# Patient Record
Sex: Male | Born: 1945 | Race: White | Hispanic: No | Marital: Married | Smoking: Former smoker
Health system: Southern US, Community
[De-identification: ages and names within clinical notes are randomized; demographics above are authoritative.]

## PROBLEM LIST (undated history)

## (undated) DIAGNOSIS — N400 Enlarged prostate without lower urinary tract symptoms: Secondary | ICD-10-CM

## (undated) DIAGNOSIS — G629 Polyneuropathy, unspecified: Secondary | ICD-10-CM

## (undated) DIAGNOSIS — C91Z0 Other lymphoid leukemia not having achieved remission: Secondary | ICD-10-CM

## (undated) DIAGNOSIS — I10 Essential (primary) hypertension: Secondary | ICD-10-CM

## (undated) DIAGNOSIS — R972 Elevated prostate specific antigen [PSA]: Secondary | ICD-10-CM

## (undated) DIAGNOSIS — E785 Hyperlipidemia, unspecified: Secondary | ICD-10-CM

## (undated) DIAGNOSIS — G252 Other specified forms of tremor: Principal | ICD-10-CM

## (undated) DIAGNOSIS — G250 Essential tremor: Secondary | ICD-10-CM

## (undated) DIAGNOSIS — I2510 Atherosclerotic heart disease of native coronary artery without angina pectoris: Secondary | ICD-10-CM

## (undated) DIAGNOSIS — Z860101 Personal history of adenomatous and serrated colon polyps: Secondary | ICD-10-CM

## (undated) DIAGNOSIS — N529 Male erectile dysfunction, unspecified: Secondary | ICD-10-CM

## (undated) DIAGNOSIS — Z86010 Personal history of colonic polyps: Secondary | ICD-10-CM

## (undated) DIAGNOSIS — N486 Induration penis plastica: Secondary | ICD-10-CM

## (undated) DIAGNOSIS — E538 Deficiency of other specified B group vitamins: Secondary | ICD-10-CM

## (undated) DIAGNOSIS — Z955 Presence of coronary angioplasty implant and graft: Secondary | ICD-10-CM

## (undated) DIAGNOSIS — M1990 Unspecified osteoarthritis, unspecified site: Secondary | ICD-10-CM

## (undated) DIAGNOSIS — F909 Attention-deficit hyperactivity disorder, unspecified type: Secondary | ICD-10-CM

## (undated) DIAGNOSIS — R351 Nocturia: Secondary | ICD-10-CM

## (undated) DIAGNOSIS — K5000 Crohn's disease of small intestine without complications: Secondary | ICD-10-CM

## (undated) HISTORY — DX: Essential tremor: G25.0

## (undated) HISTORY — PX: CARDIOVASCULAR STRESS TEST: SHX262

## (undated) HISTORY — PX: TONSILLECTOMY: SUR1361

## (undated) HISTORY — DX: Other specified forms of tremor: G25.2

## (undated) HISTORY — PX: TRANSURETHRAL RESECTION OF PROSTATE: SHX73

## (undated) HISTORY — PX: OTHER SURGICAL HISTORY: SHX169

---

## 1994-03-06 HISTORY — PX: OTHER SURGICAL HISTORY: SHX169

## 1997-12-04 HISTORY — PX: CORONARY ANGIOPLASTY WITH STENT PLACEMENT: SHX49

## 1998-01-01 ENCOUNTER — Observation Stay (HOSPITAL_COMMUNITY): Admission: AD | Admit: 1998-01-01 | Discharge: 1998-01-02 | Payer: Self-pay | Admitting: Cardiology

## 1999-02-12 ENCOUNTER — Encounter: Payer: Self-pay | Admitting: Emergency Medicine

## 1999-02-12 ENCOUNTER — Emergency Department (HOSPITAL_COMMUNITY): Admission: EM | Admit: 1999-02-12 | Discharge: 1999-02-12 | Payer: Self-pay | Admitting: Emergency Medicine

## 1999-07-21 ENCOUNTER — Encounter: Admission: RE | Admit: 1999-07-21 | Discharge: 1999-07-21 | Payer: Self-pay | Admitting: Hematology and Oncology

## 1999-07-21 ENCOUNTER — Encounter: Payer: Self-pay | Admitting: Hematology and Oncology

## 2000-03-06 HISTORY — PX: PROSTATE SURGERY: SHX751

## 2000-03-12 ENCOUNTER — Ambulatory Visit (HOSPITAL_COMMUNITY): Admission: RE | Admit: 2000-03-12 | Discharge: 2000-03-12 | Payer: Self-pay | Admitting: Gastroenterology

## 2004-01-06 ENCOUNTER — Ambulatory Visit (HOSPITAL_COMMUNITY): Admission: RE | Admit: 2004-01-06 | Discharge: 2004-01-06 | Payer: Self-pay | Admitting: General Surgery

## 2004-01-10 HISTORY — PX: LAPAROSCOPIC INGUINAL HERNIA REPAIR: SUR788

## 2004-03-14 ENCOUNTER — Ambulatory Visit: Payer: Self-pay | Admitting: Oncology

## 2004-08-15 ENCOUNTER — Ambulatory Visit: Payer: Self-pay | Admitting: Oncology

## 2004-10-27 ENCOUNTER — Encounter (INDEPENDENT_AMBULATORY_CARE_PROVIDER_SITE_OTHER): Payer: Self-pay | Admitting: *Deleted

## 2004-10-27 ENCOUNTER — Ambulatory Visit (HOSPITAL_BASED_OUTPATIENT_CLINIC_OR_DEPARTMENT_OTHER): Admission: RE | Admit: 2004-10-27 | Discharge: 2004-10-27 | Payer: Self-pay | Admitting: General Surgery

## 2005-08-17 ENCOUNTER — Ambulatory Visit: Payer: Self-pay | Admitting: Oncology

## 2005-08-28 LAB — CBC WITH DIFFERENTIAL/PLATELET
BASO%: 0.5 % (ref 0.0–2.0)
Basophils Absolute: 0 10*3/uL (ref 0.0–0.1)
EOS%: 2.6 % (ref 0.0–7.0)
Eosinophils Absolute: 0.2 10*3/uL (ref 0.0–0.5)
HCT: 43.3 % (ref 38.7–49.9)
HGB: 14.7 g/dL (ref 13.0–17.1)
LYMPH%: 24.5 % (ref 14.0–48.0)
MCH: 30 pg (ref 28.0–33.4)
MCHC: 34 g/dL (ref 32.0–35.9)
MCV: 88.4 fL (ref 81.6–98.0)
MONO#: 0.7 10*3/uL (ref 0.1–0.9)
MONO%: 8.8 % (ref 0.0–13.0)
NEUT#: 5.1 10*3/uL (ref 1.5–6.5)
NEUT%: 63.6 % (ref 40.0–75.0)
Platelets: 263 10*3/uL (ref 145–400)
RBC: 4.9 10*6/uL (ref 4.20–5.71)
RDW: 13.9 % (ref 11.2–14.6)
WBC: 8.1 10*3/uL (ref 4.0–10.0)
lymph#: 2 10*3/uL (ref 0.9–3.3)

## 2005-12-14 ENCOUNTER — Ambulatory Visit: Payer: Self-pay | Admitting: Oncology

## 2006-08-23 ENCOUNTER — Ambulatory Visit: Payer: Self-pay | Admitting: Oncology

## 2006-09-03 LAB — CBC WITH DIFFERENTIAL/PLATELET
BASO%: 0.5 % (ref 0.0–2.0)
Basophils Absolute: 0 10*3/uL (ref 0.0–0.1)
EOS%: 3 % (ref 0.0–7.0)
Eosinophils Absolute: 0.2 10*3/uL (ref 0.0–0.5)
HCT: 41.9 % (ref 38.7–49.9)
HGB: 14.5 g/dL (ref 13.0–17.1)
LYMPH%: 24.2 % (ref 14.0–48.0)
MCH: 30.4 pg (ref 28.0–33.4)
MCHC: 34.5 g/dL (ref 32.0–35.9)
MCV: 88 fL (ref 81.6–98.0)
MONO#: 0.8 10*3/uL (ref 0.1–0.9)
MONO%: 9.9 % (ref 0.0–13.0)
NEUT#: 4.8 10*3/uL (ref 1.5–6.5)
NEUT%: 62.4 % (ref 40.0–75.0)
Platelets: 229 10*3/uL (ref 145–400)
RBC: 4.76 10*6/uL (ref 4.20–5.71)
RDW: 14.1 % (ref 11.2–14.6)
WBC: 7.7 10*3/uL (ref 4.0–10.0)
lymph#: 1.8 10*3/uL (ref 0.9–3.3)

## 2007-08-30 ENCOUNTER — Ambulatory Visit: Payer: Self-pay | Admitting: Oncology

## 2007-09-03 LAB — CBC WITH DIFFERENTIAL/PLATELET
BASO%: 0.5 % (ref 0.0–2.0)
Basophils Absolute: 0 10*3/uL (ref 0.0–0.1)
EOS%: 3.2 % (ref 0.0–7.0)
Eosinophils Absolute: 0.2 10*3/uL (ref 0.0–0.5)
HCT: 43.3 % (ref 38.7–49.9)
HGB: 14.9 g/dL (ref 13.0–17.1)
LYMPH%: 25.4 % (ref 14.0–48.0)
MCH: 30.1 pg (ref 28.0–33.4)
MCHC: 34.4 g/dL (ref 32.0–35.9)
MCV: 87.5 fL (ref 81.6–98.0)
MONO#: 0.5 10*3/uL (ref 0.1–0.9)
MONO%: 8.2 % (ref 0.0–13.0)
NEUT#: 4.2 10*3/uL (ref 1.5–6.5)
NEUT%: 62.7 % (ref 40.0–75.0)
Platelets: 240 10*3/uL (ref 145–400)
RBC: 4.95 10*6/uL (ref 4.20–5.71)
RDW: 13.8 % (ref 11.2–14.6)
WBC: 6.6 10*3/uL (ref 4.0–10.0)
lymph#: 1.7 10*3/uL (ref 0.9–3.3)

## 2007-09-03 LAB — IGG, IGA, IGM
IgA: 70 mg/dL (ref 68–378)
IgG (Immunoglobin G), Serum: 817 mg/dL (ref 694–1618)
IgM, Serum: 15 mg/dL — ABNORMAL LOW (ref 60–263)

## 2008-09-15 ENCOUNTER — Ambulatory Visit: Payer: Self-pay | Admitting: Oncology

## 2008-09-17 LAB — CBC WITH DIFFERENTIAL/PLATELET
BASO%: 0.5 % (ref 0.0–2.0)
Basophils Absolute: 0 10*3/uL (ref 0.0–0.1)
EOS%: 3.3 % (ref 0.0–7.0)
Eosinophils Absolute: 0.3 10*3/uL (ref 0.0–0.5)
HCT: 43.7 % (ref 38.4–49.9)
HGB: 14.9 g/dL (ref 13.0–17.1)
LYMPH%: 23 % (ref 14.0–49.0)
MCH: 30.2 pg (ref 27.2–33.4)
MCHC: 34.1 g/dL (ref 32.0–36.0)
MCV: 88.4 fL (ref 79.3–98.0)
MONO#: 0.6 10*3/uL (ref 0.1–0.9)
MONO%: 7.7 % (ref 0.0–14.0)
NEUT#: 5.2 10*3/uL (ref 1.5–6.5)
NEUT%: 65.5 % (ref 39.0–75.0)
Platelets: 224 10*3/uL (ref 140–400)
RBC: 4.94 10*6/uL (ref 4.20–5.82)
RDW: 13.4 % (ref 11.0–14.6)
WBC: 7.9 10*3/uL (ref 4.0–10.3)
lymph#: 1.8 10*3/uL (ref 0.9–3.3)

## 2009-09-14 ENCOUNTER — Ambulatory Visit: Payer: Self-pay | Admitting: Oncology

## 2009-09-16 LAB — CBC WITH DIFFERENTIAL/PLATELET
BASO%: 0.7 % (ref 0.0–2.0)
Basophils Absolute: 0 10*3/uL (ref 0.0–0.1)
EOS%: 3.4 % (ref 0.0–7.0)
Eosinophils Absolute: 0.2 10*3/uL (ref 0.0–0.5)
HCT: 41.8 % (ref 38.4–49.9)
HGB: 14.4 g/dL (ref 13.0–17.1)
LYMPH%: 24.6 % (ref 14.0–49.0)
MCH: 30.6 pg (ref 27.2–33.4)
MCHC: 34.4 g/dL (ref 32.0–36.0)
MCV: 88.9 fL (ref 79.3–98.0)
MONO#: 0.7 10*3/uL (ref 0.1–0.9)
MONO%: 10.1 % (ref 0.0–14.0)
NEUT#: 4.4 10*3/uL (ref 1.5–6.5)
NEUT%: 61.2 % (ref 39.0–75.0)
Platelets: 214 10*3/uL (ref 140–400)
RBC: 4.7 10*6/uL (ref 4.20–5.82)
RDW: 13.6 % (ref 11.0–14.6)
WBC: 7.2 10*3/uL (ref 4.0–10.3)
lymph#: 1.8 10*3/uL (ref 0.9–3.3)

## 2010-02-01 ENCOUNTER — Encounter: Admission: RE | Admit: 2010-02-01 | Discharge: 2010-02-01 | Payer: Self-pay | Admitting: Neurosurgery

## 2010-05-13 ENCOUNTER — Emergency Department (HOSPITAL_COMMUNITY): Payer: Medicare Other

## 2010-05-13 ENCOUNTER — Emergency Department (HOSPITAL_COMMUNITY)
Admission: EM | Admit: 2010-05-13 | Discharge: 2010-05-13 | Disposition: A | Discharge status: Home / Self Care | Payer: Medicare Other | Attending: Emergency Medicine | Admitting: Emergency Medicine

## 2010-05-13 DIAGNOSIS — M25469 Effusion, unspecified knee: Secondary | ICD-10-CM | POA: Insufficient documentation

## 2010-05-13 DIAGNOSIS — K219 Gastro-esophageal reflux disease without esophagitis: Secondary | ICD-10-CM | POA: Insufficient documentation

## 2010-05-13 DIAGNOSIS — M7989 Other specified soft tissue disorders: Secondary | ICD-10-CM | POA: Insufficient documentation

## 2010-05-13 DIAGNOSIS — Z7982 Long term (current) use of aspirin: Secondary | ICD-10-CM | POA: Insufficient documentation

## 2010-05-13 DIAGNOSIS — M79609 Pain in unspecified limb: Secondary | ICD-10-CM

## 2010-05-13 DIAGNOSIS — M25569 Pain in unspecified knee: Secondary | ICD-10-CM | POA: Insufficient documentation

## 2010-05-13 DIAGNOSIS — I824Z9 Acute embolism and thrombosis of unspecified deep veins of unspecified distal lower extremity: Secondary | ICD-10-CM | POA: Insufficient documentation

## 2010-05-13 DIAGNOSIS — Z79899 Other long term (current) drug therapy: Secondary | ICD-10-CM | POA: Insufficient documentation

## 2010-05-13 DIAGNOSIS — K5090 Crohn's disease, unspecified, without complications: Secondary | ICD-10-CM | POA: Insufficient documentation

## 2010-05-13 LAB — POCT I-STAT, CHEM 8
BUN: 20 mg/dL (ref 6–23)
Calcium, Ion: 1 mmol/L — ABNORMAL LOW (ref 1.12–1.32)
Chloride: 106 mEq/L (ref 96–112)
Creatinine, Ser: 1 mg/dL (ref 0.4–1.5)
Glucose, Bld: 95 mg/dL (ref 70–99)
HCT: 46 % (ref 39.0–52.0)
Hemoglobin: 15.6 g/dL (ref 13.0–17.0)
Potassium: 3.9 mEq/L (ref 3.5–5.1)
Sodium: 139 mEq/L (ref 135–145)
TCO2: 22 mmol/L (ref 0–100)

## 2010-05-16 ENCOUNTER — Other Ambulatory Visit: Payer: Self-pay | Admitting: Gastroenterology

## 2010-05-16 ENCOUNTER — Ambulatory Visit
Admission: RE | Admit: 2010-05-16 | Discharge: 2010-05-16 | Disposition: A | Discharge status: Home / Self Care | Payer: Medicare Other | Source: Ambulatory Visit | Attending: Gastroenterology | Admitting: Gastroenterology

## 2010-05-16 DIAGNOSIS — R609 Edema, unspecified: Secondary | ICD-10-CM

## 2010-05-16 DIAGNOSIS — R52 Pain, unspecified: Secondary | ICD-10-CM

## 2010-07-22 NOTE — Op Note (Signed)
Cole Martinez, Cole Martinez NO.:  [id number]   MEDICAL RECORD NO.:  [id number]          PATIENT TYPE:  AMB   LOCATION:  DAY                          FACILITY:  [facility] - Inpatient   PHYSICIAN:  Sammuel Hines. Daiva Nakayama, M.D. DATE OF BIRTH:  06/03/1945   DATE OF PROCEDURE:  01/10/2004  DATE OF DISCHARGE:  01/06/2004                                 OPERATIVE REPORT   PREOPERATIVE DIAGNOSES:  Bilateral inguinal hernias.   POSTOPERATIVE DIAGNOSES:  Bilateral inguinal hernias.   OPERATION PERFORMED:  Laparoscopic bilateral inguinal hernia repairs with  mesh.   SURGEON:  Sammuel Hines. Marlou Starks, M.D.   ASSISTANT:  Kathrin Penner, M.D.   ANESTHESIA:  General endotracheal.   DESCRIPTION OF PROCEDURE:  After informed consent was obtained, the patient  was brought to the operating room and placed in supine position on the  operating table.  After adequate induction of general anesthesia, the  patient's abdomen was prepped with Betadine and draped in the usual sterile  manner.  The area just to the right of the umbilicus was infiltrated with  0.25% Marcaine.  A small incision was made with a 15 blade knife just  inferior and lateral to the umbilicus.  This incision was carried down  through the skin and subcutaneous tissue bluntly with Kelly clamp and army  navy retractors until the fascia of the external oblique was encountered.  The fascia of the external oblique was opened sharply with a 15 blade knife.  The rectus muscle was then split laterally and the S-retractor was placed in  this space just posterior to the rectus muscle.  A balloon dilating device  was then placed in this plane and angled toward the pubic symphysis. Once  this device was in place and could be felt touching the pubic symphysis, the  balloon was inflated with approximately 35 pumps of the insufflation device.  The balloon was left inflated for several minutes to help with hemostasis.  The balloon was then deflated and removed from  the space.  The Hasson  cannula was then placed back in this space behind the rectus muscle and the  balloon on the Hasson was inflated with 20 mL of saline.  The preperitoneal  space was then insufflated without difficulty.  Sites on the midline just  beneath the Hasson cannula were chosen for placement of 5 mm ports.  Each of  these areas was infiltrated with 0.25% Marcaine and 5 mm ports were placed  bluntly through these incisions into the abdominal cavity under direct  vision.  Initially, attention was turned to the left side.  Using blunt  dissection, the cord structures were skeletonized.  There was a definite  direct hernia.  There was no obvious cord sac with the cord structures.  Once the area was completely cleared from blunt dissection and space was  made circumferentially around the cord structures, a 6 x 6 piece of Prolene  mesh was cut to approximately 4 x 6.  Tails were cut in the mesh laterally.  The mesh was then rolled like a cigar and placed in the preperitoneal space.  Once in the space, the mesh was unrolled without difficulty. The inferior  tail of the mesh was placed deep and around the spermatic cord and the tails  were placed lateral to the cord.  Once the mesh was in good position, it was  anchored in placed with several tacks to hold the mesh in place.  Attention  was then turned to the right side and again using blunt dissection, the  right inguinal region was cleared.  There was a definite direct defect.  The  cord was skeletonized by blunt dissection and there was no obvious hernia  sac, but the cord structures.  Space was cleared around the cord  circumferentially.  Another 6 x 6 piece of Prolene mesh was then cut to 4 x  6 and tails were cut in the mesh laterally.  The mesh was rolled like a  cigar, placed in the preperitoneal space where it was unrolled. The inferior  mesh was then placed posterior to the cord and the tails were placed lateral  to the cord.   Once the mesh was in good position covering the defects, the  mesh was anchored in place with several tacks.  The area was examined and  found to be completely hemostatic.  The mesh was in good position.  At this  point, the insufflation was released and the mesh was examined to make sure  it stayed in place which it did.  The Hasson and 5 mm ports were then  removed and all were found to be hemostatic.  The fascial defect at the  [facility] port site was closed with a figure-of-eight 0 Vicryl stitch and the  skin incisions were all closed with interrupted 4-0 Monocryl subcuticular  stitches and benzoin, Steri-Strips and sterile dressings were applied.  The  patient tolerated the procedure well.  At the end of the case, all sponge,  needle and instrument counts were correct. The patient was then awakened and  taken to the recovery room in stable condition.      PST/MEDQ  D:  01/10/2004  T:  01/11/2004  Job:  [id number]

## 2010-08-17 ENCOUNTER — Other Ambulatory Visit: Payer: Self-pay | Admitting: Dermatology

## 2010-10-18 ENCOUNTER — Other Ambulatory Visit: Payer: Self-pay | Admitting: Oncology

## 2010-10-18 ENCOUNTER — Encounter (HOSPITAL_BASED_OUTPATIENT_CLINIC_OR_DEPARTMENT_OTHER): Payer: Medicare Other | Admitting: Oncology

## 2010-10-18 DIAGNOSIS — C9110 Chronic lymphocytic leukemia of B-cell type not having achieved remission: Secondary | ICD-10-CM

## 2010-10-18 DIAGNOSIS — Z23 Encounter for immunization: Secondary | ICD-10-CM

## 2010-10-18 LAB — CBC WITH DIFFERENTIAL/PLATELET
BASO%: 0.3 % (ref 0.0–2.0)
Basophils Absolute: 0 10*3/uL (ref 0.0–0.1)
EOS%: 3 % (ref 0.0–7.0)
Eosinophils Absolute: 0.2 10*3/uL (ref 0.0–0.5)
HCT: 41.9 % (ref 38.4–49.9)
HGB: 14.4 g/dL (ref 13.0–17.1)
LYMPH%: 21.8 % (ref 14.0–49.0)
MCH: 30.9 pg (ref 27.2–33.4)
MCHC: 34.3 g/dL (ref 32.0–36.0)
MCV: 90.2 fL (ref 79.3–98.0)
MONO#: 0.7 10*3/uL (ref 0.1–0.9)
MONO%: 9.3 % (ref 0.0–14.0)
NEUT#: 4.8 10*3/uL (ref 1.5–6.5)
NEUT%: 65.6 % (ref 39.0–75.0)
Platelets: 209 10*3/uL (ref 140–400)
RBC: 4.65 10*6/uL (ref 4.20–5.82)
RDW: 13.6 % (ref 11.0–14.6)
WBC: 7.4 10*3/uL (ref 4.0–10.3)
lymph#: 1.6 10*3/uL (ref 0.9–3.3)

## 2011-03-22 DIAGNOSIS — N486 Induration penis plastica: Secondary | ICD-10-CM | POA: Diagnosis not present

## 2011-03-22 DIAGNOSIS — N401 Enlarged prostate with lower urinary tract symptoms: Secondary | ICD-10-CM | POA: Diagnosis not present

## 2011-04-15 ENCOUNTER — Telehealth: Payer: Self-pay | Admitting: Oncology

## 2011-04-15 NOTE — Telephone Encounter (Signed)
lmonvm advising the pt of his aug 2013 appts

## 2011-04-25 DIAGNOSIS — M51379 Other intervertebral disc degeneration, lumbosacral region without mention of lumbar back pain or lower extremity pain: Secondary | ICD-10-CM | POA: Diagnosis not present

## 2011-04-25 DIAGNOSIS — M5137 Other intervertebral disc degeneration, lumbosacral region: Secondary | ICD-10-CM | POA: Diagnosis not present

## 2011-04-25 DIAGNOSIS — M1710 Unilateral primary osteoarthritis, unspecified knee: Secondary | ICD-10-CM | POA: Diagnosis not present

## 2011-04-26 DIAGNOSIS — M545 Low back pain, unspecified: Secondary | ICD-10-CM | POA: Diagnosis not present

## 2011-04-26 DIAGNOSIS — M4310 Spondylolisthesis, site unspecified: Secondary | ICD-10-CM | POA: Diagnosis not present

## 2011-04-26 DIAGNOSIS — M546 Pain in thoracic spine: Secondary | ICD-10-CM | POA: Diagnosis not present

## 2011-06-21 DIAGNOSIS — I2510 Atherosclerotic heart disease of native coronary artery without angina pectoris: Secondary | ICD-10-CM | POA: Diagnosis not present

## 2011-06-21 DIAGNOSIS — Z0000 Encounter for general adult medical examination without abnormal findings: Secondary | ICD-10-CM | POA: Diagnosis not present

## 2011-06-21 DIAGNOSIS — K5000 Crohn's disease of small intestine without complications: Secondary | ICD-10-CM | POA: Diagnosis not present

## 2011-06-21 DIAGNOSIS — E538 Deficiency of other specified B group vitamins: Secondary | ICD-10-CM | POA: Diagnosis not present

## 2011-06-21 DIAGNOSIS — C9110 Chronic lymphocytic leukemia of B-cell type not having achieved remission: Secondary | ICD-10-CM | POA: Diagnosis not present

## 2011-06-21 DIAGNOSIS — N400 Enlarged prostate without lower urinary tract symptoms: Secondary | ICD-10-CM | POA: Diagnosis not present

## 2011-06-21 DIAGNOSIS — E7800 Pure hypercholesterolemia, unspecified: Secondary | ICD-10-CM | POA: Diagnosis not present

## 2011-07-18 DIAGNOSIS — M5137 Other intervertebral disc degeneration, lumbosacral region: Secondary | ICD-10-CM | POA: Diagnosis not present

## 2011-07-18 DIAGNOSIS — M1710 Unilateral primary osteoarthritis, unspecified knee: Secondary | ICD-10-CM | POA: Diagnosis not present

## 2011-10-10 ENCOUNTER — Telehealth: Payer: Self-pay | Admitting: Oncology

## 2011-10-10 NOTE — Telephone Encounter (Signed)
S/w the pt and she is aware of his r/s aug appt to sept 2013

## 2011-10-17 ENCOUNTER — Ambulatory Visit: Payer: Medicare Other | Admitting: Oncology

## 2011-10-17 ENCOUNTER — Other Ambulatory Visit: Payer: Medicare Other | Admitting: Lab

## 2011-10-17 DIAGNOSIS — E782 Mixed hyperlipidemia: Secondary | ICD-10-CM | POA: Diagnosis not present

## 2011-10-23 DIAGNOSIS — I2510 Atherosclerotic heart disease of native coronary artery without angina pectoris: Secondary | ICD-10-CM | POA: Diagnosis not present

## 2011-10-23 DIAGNOSIS — C9110 Chronic lymphocytic leukemia of B-cell type not having achieved remission: Secondary | ICD-10-CM | POA: Diagnosis not present

## 2011-10-23 DIAGNOSIS — K5090 Crohn's disease, unspecified, without complications: Secondary | ICD-10-CM | POA: Diagnosis not present

## 2011-10-23 DIAGNOSIS — R030 Elevated blood-pressure reading, without diagnosis of hypertension: Secondary | ICD-10-CM | POA: Diagnosis not present

## 2011-10-23 DIAGNOSIS — F909 Attention-deficit hyperactivity disorder, unspecified type: Secondary | ICD-10-CM | POA: Diagnosis not present

## 2011-10-23 DIAGNOSIS — E7800 Pure hypercholesterolemia, unspecified: Secondary | ICD-10-CM | POA: Diagnosis not present

## 2011-11-16 ENCOUNTER — Other Ambulatory Visit (HOSPITAL_BASED_OUTPATIENT_CLINIC_OR_DEPARTMENT_OTHER): Payer: Medicare Other

## 2011-11-16 ENCOUNTER — Telehealth: Payer: Self-pay | Admitting: Oncology

## 2011-11-16 ENCOUNTER — Ambulatory Visit (HOSPITAL_BASED_OUTPATIENT_CLINIC_OR_DEPARTMENT_OTHER): Payer: Medicare Other | Admitting: Oncology

## 2011-11-16 VITALS — BP 142/89 | HR 75 | Temp 98.3°F | Resp 20 | Wt 197.0 lb

## 2011-11-16 DIAGNOSIS — C9110 Chronic lymphocytic leukemia of B-cell type not having achieved remission: Secondary | ICD-10-CM

## 2011-11-16 LAB — CBC WITH DIFFERENTIAL/PLATELET
BASO%: 0.4 % (ref 0.0–2.0)
Basophils Absolute: 0 10*3/uL (ref 0.0–0.1)
EOS%: 2.1 % (ref 0.0–7.0)
Eosinophils Absolute: 0.2 10*3/uL (ref 0.0–0.5)
HCT: 43 % (ref 38.4–49.9)
HGB: 14.6 g/dL (ref 13.0–17.1)
LYMPH%: 16.9 % (ref 14.0–49.0)
MCH: 30.6 pg (ref 27.2–33.4)
MCHC: 34 g/dL (ref 32.0–36.0)
MCV: 90.2 fL (ref 79.3–98.0)
MONO#: 1 10*3/uL — ABNORMAL HIGH (ref 0.1–0.9)
MONO%: 10.3 % (ref 0.0–14.0)
NEUT#: 7 10*3/uL — ABNORMAL HIGH (ref 1.5–6.5)
NEUT%: 70.3 % (ref 39.0–75.0)
Platelets: 214 10*3/uL (ref 140–400)
RBC: 4.77 10*6/uL (ref 4.20–5.82)
RDW: 13.7 % (ref 11.0–14.6)
WBC: 9.9 10*3/uL (ref 4.0–10.3)
lymph#: 1.7 10*3/uL (ref 0.9–3.3)

## 2011-11-16 NOTE — Telephone Encounter (Signed)
Per patient for ins purposes apt and labs needed to be at end of august.  Made and printed schedule for pt august 2014. Pt aware.

## 2011-11-16 NOTE — Patient Instructions (Signed)
[facility] Discharge Instructions  Your exam findings, labs and results were discussed with your MD today.   Please visit scheduling to obtain calendar for future appointments.  Please call the [facility] at [phone] during business hours should you have any further questions or need assistance in obtaining follow-up care. If you have a medical emergency, please dial 911.  Special Instructions:

## 2011-11-16 NOTE — Progress Notes (Signed)
   [place]    OFFICE PROGRESS NOTE   INTERVAL HISTORY:   He returns as scheduled. No fever, night sweats, anorexia/weight loss, or recent infections. No palpable lymph nodes. His only complaint is arthritis pain.  Objective:  Vital signs in last 24 hours:  Blood pressure 142/89, pulse 75, temperature 98.3 F (36.8 C), temperature source Oral, resp. rate 20, weight 197 lb (89.359 kg).    HEENT: Neck without mass Lymphatics: No cervical, supra-clavicular, axillary, or inguinal nodes Resp: Lungs clear bilaterally Cardio: Regular rate and rhythm GI: No hepatosplenomegaly Vascular: No leg edema   Lab Results:  Lab Results  Component Value Date   WBC 9.9 11/16/2011   HGB 14.6 11/16/2011   HCT 43.0 11/16/2011   MCV 90.2 11/16/2011   PLT 214 11/16/2011   ANC 7.0, absolute lymphocyte count 1.7   Medications: I have reviewed the patient's current medications.  Assessment/Plan: 1. Chronic lymphocytic leukemia, diagnosed in 1996.  He remains asymptomatic and stable from a hematologic standpoint. 2.     pneumococcal vaccine given on 10/18/2010  Disposition:  He remains asymptomatic from the chronic lymphocytic leukemia. He would like to continue followup in the hematology clinic. He will return for an office visit and CBC in one year. He will stay up-to-date on the influenza vaccine.   Betsy Coder, MD  11/16/2011  2:19 PM

## 2011-12-08 DIAGNOSIS — J069 Acute upper respiratory infection, unspecified: Secondary | ICD-10-CM | POA: Diagnosis not present

## 2011-12-08 DIAGNOSIS — E538 Deficiency of other specified B group vitamins: Secondary | ICD-10-CM | POA: Diagnosis not present

## 2011-12-22 DIAGNOSIS — H2510 Age-related nuclear cataract, unspecified eye: Secondary | ICD-10-CM | POA: Diagnosis not present

## 2012-01-01 DIAGNOSIS — Z23 Encounter for immunization: Secondary | ICD-10-CM | POA: Diagnosis not present

## 2012-03-13 DIAGNOSIS — E538 Deficiency of other specified B group vitamins: Secondary | ICD-10-CM | POA: Diagnosis not present

## 2012-05-16 DIAGNOSIS — N401 Enlarged prostate with lower urinary tract symptoms: Secondary | ICD-10-CM | POA: Diagnosis not present

## 2012-05-22 DIAGNOSIS — N486 Induration penis plastica: Secondary | ICD-10-CM | POA: Diagnosis not present

## 2012-05-22 DIAGNOSIS — R972 Elevated prostate specific antigen [PSA]: Secondary | ICD-10-CM | POA: Diagnosis not present

## 2012-05-22 DIAGNOSIS — N401 Enlarged prostate with lower urinary tract symptoms: Secondary | ICD-10-CM | POA: Diagnosis not present

## 2012-06-11 DIAGNOSIS — E538 Deficiency of other specified B group vitamins: Secondary | ICD-10-CM | POA: Diagnosis not present

## 2012-06-14 ENCOUNTER — Other Ambulatory Visit: Payer: Self-pay | Admitting: Urology

## 2012-06-24 DIAGNOSIS — E7800 Pure hypercholesterolemia, unspecified: Secondary | ICD-10-CM | POA: Diagnosis not present

## 2012-06-24 DIAGNOSIS — N400 Enlarged prostate without lower urinary tract symptoms: Secondary | ICD-10-CM | POA: Diagnosis not present

## 2012-06-24 DIAGNOSIS — Z0000 Encounter for general adult medical examination without abnormal findings: Secondary | ICD-10-CM | POA: Diagnosis not present

## 2012-06-24 DIAGNOSIS — Z86010 Personal history of colonic polyps: Secondary | ICD-10-CM | POA: Diagnosis not present

## 2012-06-24 DIAGNOSIS — C8441 Peripheral T-cell lymphoma, not classified, lymph nodes of head, face, and neck: Secondary | ICD-10-CM | POA: Diagnosis not present

## 2012-06-24 DIAGNOSIS — K5000 Crohn's disease of small intestine without complications: Secondary | ICD-10-CM | POA: Diagnosis not present

## 2012-07-09 ENCOUNTER — Encounter (HOSPITAL_BASED_OUTPATIENT_CLINIC_OR_DEPARTMENT_OTHER): Payer: Self-pay | Admitting: *Deleted

## 2012-07-09 NOTE — Progress Notes (Signed)
SPOKE W/ PT WIFE, JOYCE. NPO AFTER MN. ARRIVES AT 0645. NEEDS HG. RECENT CBC W/ DIFF RESULT  06-24-2012 FROM PCP W/ CHART. LOV NOTE, EKG AND STRESS TEST TO BE FAXED FROM DR Daneen Schick. WILL DO FLEET ENEMA AM OF SURG.

## 2012-07-10 ENCOUNTER — Encounter (HOSPITAL_BASED_OUTPATIENT_CLINIC_OR_DEPARTMENT_OTHER): Payer: Self-pay | Admitting: *Deleted

## 2012-07-11 NOTE — Anesthesia Preprocedure Evaluation (Addendum)
Anesthesia Evaluation  Patient identified by MRN, date of birth, ID band Patient awake    Reviewed: Allergy & Precautions, H&P , NPO status , Patient's Chart, lab work & pertinent test results  Airway Mallampati: II TM Distance: >3 FB Neck ROM: Full    Dental  (+) Teeth Intact and Caps   Pulmonary former smoker,  breath sounds clear to auscultation  Pulmonary exam normal       Cardiovascular + CAD and + Cardiac Stents Rhythm:Regular     Neuro/Psych negative neurological ROS  negative psych ROS   GI/Hepatic negative GI ROS, Neg liver ROS, Crohns disease   Endo/Other  negative endocrine ROS  Renal/GU negative Renal ROS  negative genitourinary   Musculoskeletal negative musculoskeletal ROS (+)   Abdominal   Peds negative pediatric ROS (+)  Hematology negative hematology ROS (+) Blood dyscrasia, , Hx of CLL, asymptomatic   Anesthesia Other Findings Multiple caps  Reproductive/Obstetrics                          Anesthesia Physical Anesthesia Plan  ASA: III  Anesthesia Plan: General   Post-op Pain Management:    Induction: Intravenous  Airway Management Planned: LMA  Additional Equipment:   Intra-op Plan:   Post-operative Plan: Extubation in OR  Informed Consent: I have reviewed the patients History and Physical, chart, labs and discussed the procedure including the risks, benefits and alternatives for the proposed anesthesia with the patient or authorized representative who has indicated his/her understanding and acceptance.   Dental advisory given  Plan Discussed with: CRNA  Anesthesia Plan Comments:       Anesthesia Quick Evaluation

## 2012-07-12 ENCOUNTER — Encounter (HOSPITAL_BASED_OUTPATIENT_CLINIC_OR_DEPARTMENT_OTHER): Payer: Self-pay | Admitting: Anesthesiology

## 2012-07-12 ENCOUNTER — Ambulatory Visit (HOSPITAL_BASED_OUTPATIENT_CLINIC_OR_DEPARTMENT_OTHER)
Admission: RE | Admit: 2012-07-12 | Discharge: 2012-07-12 | Disposition: A | Discharge status: Home / Self Care | Payer: Medicare Other | Source: Ambulatory Visit | Attending: Urology | Admitting: Urology

## 2012-07-12 ENCOUNTER — Encounter (HOSPITAL_BASED_OUTPATIENT_CLINIC_OR_DEPARTMENT_OTHER)
Admission: RE | Disposition: A | Discharge status: Home / Self Care | Payer: Self-pay | Source: Ambulatory Visit | Attending: Urology

## 2012-07-12 ENCOUNTER — Ambulatory Visit (HOSPITAL_BASED_OUTPATIENT_CLINIC_OR_DEPARTMENT_OTHER): Payer: Medicare Other | Admitting: Anesthesiology

## 2012-07-12 DIAGNOSIS — N401 Enlarged prostate with lower urinary tract symptoms: Secondary | ICD-10-CM | POA: Insufficient documentation

## 2012-07-12 DIAGNOSIS — Z79899 Other long term (current) drug therapy: Secondary | ICD-10-CM | POA: Diagnosis not present

## 2012-07-12 DIAGNOSIS — K219 Gastro-esophageal reflux disease without esophagitis: Secondary | ICD-10-CM | POA: Diagnosis not present

## 2012-07-12 DIAGNOSIS — R972 Elevated prostate specific antigen [PSA]: Secondary | ICD-10-CM | POA: Diagnosis not present

## 2012-07-12 DIAGNOSIS — Z856 Personal history of leukemia: Secondary | ICD-10-CM | POA: Insufficient documentation

## 2012-07-12 DIAGNOSIS — D291 Benign neoplasm of prostate: Secondary | ICD-10-CM | POA: Diagnosis not present

## 2012-07-12 DIAGNOSIS — K5090 Crohn's disease, unspecified, without complications: Secondary | ICD-10-CM | POA: Insufficient documentation

## 2012-07-12 DIAGNOSIS — N138 Other obstructive and reflux uropathy: Secondary | ICD-10-CM | POA: Insufficient documentation

## 2012-07-12 DIAGNOSIS — N139 Obstructive and reflux uropathy, unspecified: Secondary | ICD-10-CM | POA: Insufficient documentation

## 2012-07-12 HISTORY — DX: Presence of coronary angioplasty implant and graft: Z95.5

## 2012-07-12 HISTORY — DX: Induration penis plastica: N48.6

## 2012-07-12 HISTORY — DX: Crohn's disease of small intestine without complications: K50.00

## 2012-07-12 HISTORY — DX: Atherosclerotic heart disease of native coronary artery without angina pectoris: I25.10

## 2012-07-12 HISTORY — PX: PROSTATE BIOPSY: SHX241

## 2012-07-12 HISTORY — DX: Benign prostatic hyperplasia without lower urinary tract symptoms: N40.0

## 2012-07-12 HISTORY — DX: Unspecified osteoarthritis, unspecified site: M19.90

## 2012-07-12 HISTORY — DX: Essential tremor: G25.0

## 2012-07-12 HISTORY — DX: Nocturia: R35.1

## 2012-07-12 HISTORY — DX: Deficiency of other specified B group vitamins: E53.8

## 2012-07-12 HISTORY — DX: Male erectile dysfunction, unspecified: N52.9

## 2012-07-12 HISTORY — DX: Attention-deficit hyperactivity disorder, unspecified type: F90.9

## 2012-07-12 HISTORY — DX: Elevated prostate specific antigen (PSA): R97.20

## 2012-07-12 HISTORY — DX: Personal history of colonic polyps: Z86.010

## 2012-07-12 HISTORY — DX: Hyperlipidemia, unspecified: E78.5

## 2012-07-12 HISTORY — DX: Personal history of adenomatous and serrated colon polyps: Z86.0101

## 2012-07-12 HISTORY — DX: Other lymphoid leukemia not having achieved remission: C91.Z0

## 2012-07-12 HISTORY — DX: Polyneuropathy, unspecified: G62.9

## 2012-07-12 LAB — POCT HEMOGLOBIN-HEMACUE: Hemoglobin: 14.9 g/dL (ref 13.0–17.0)

## 2012-07-12 SURGERY — BIOPSY, PROSTATE
Anesthesia: Monitor Anesthesia Care | Site: Prostate | Wound class: Clean Contaminated

## 2012-07-12 MED ORDER — ACETAMINOPHEN 10 MG/ML IV SOLN
INTRAVENOUS | Status: DC | PRN
  Administered 2012-07-12: 1000 mg via INTRAVENOUS

## 2012-07-12 MED ORDER — CIPROFLOXACIN IN D5W 400 MG/200ML IV SOLN
400.0000 mg | INTRAVENOUS | Status: AC
  Administered 2012-07-12: 400 mg via INTRAVENOUS
  Filled 2012-07-12: qty 200

## 2012-07-12 MED ORDER — LIDOCAINE HCL (CARDIAC) 20 MG/ML IV SOLN
INTRAVENOUS | Status: DC | PRN
  Administered 2012-07-12: 50 mg via INTRAVENOUS

## 2012-07-12 MED ORDER — LACTATED RINGERS IV SOLN
INTRAVENOUS | Status: DC
  Filled 2012-07-12: qty 1000

## 2012-07-12 MED ORDER — FENTANYL CITRATE 0.05 MG/ML IJ SOLN
25.0000 ug | INTRAMUSCULAR | Status: DC | PRN
  Filled 2012-07-12: qty 1

## 2012-07-12 MED ORDER — FENTANYL CITRATE 0.05 MG/ML IJ SOLN
INTRAMUSCULAR | Status: DC | PRN
  Administered 2012-07-12: 50 ug via INTRAVENOUS

## 2012-07-12 MED ORDER — MIDAZOLAM HCL 5 MG/5ML IJ SOLN
INTRAMUSCULAR | Status: DC | PRN
  Administered 2012-07-12: 0.5 mg via INTRAVENOUS

## 2012-07-12 MED ORDER — PROMETHAZINE HCL 25 MG/ML IJ SOLN
6.2500 mg | INTRAMUSCULAR | Status: DC | PRN
  Filled 2012-07-12: qty 1

## 2012-07-12 MED ORDER — TRAMADOL-ACETAMINOPHEN 37.5-325 MG PO TABS: 1.0000 | ORAL_TABLET | Freq: Four times a day (QID) | ORAL | Status: DC | PRN

## 2012-07-12 MED ORDER — BELLADONNA ALKALOIDS-OPIUM 16.2-60 MG RE SUPP
RECTAL | Status: DC | PRN
  Administered 2012-07-12: 1 via RECTAL

## 2012-07-12 MED ORDER — DEXAMETHASONE SODIUM PHOSPHATE 4 MG/ML IJ SOLN
INTRAMUSCULAR | Status: DC | PRN
  Administered 2012-07-12: 8 mg via INTRAVENOUS

## 2012-07-12 MED ORDER — PHENAZOPYRIDINE HCL 200 MG PO TABS: 200.0000 mg | ORAL_TABLET | Freq: Three times a day (TID) | ORAL | Status: DC | PRN

## 2012-07-12 MED ORDER — KETOROLAC TROMETHAMINE 30 MG/ML IJ SOLN
INTRAMUSCULAR | Status: DC | PRN
  Administered 2012-07-12: 15 mg via INTRAVENOUS

## 2012-07-12 MED ORDER — ONDANSETRON HCL 4 MG/2ML IJ SOLN
INTRAMUSCULAR | Status: DC | PRN
  Administered 2012-07-12: 4 mg via INTRAVENOUS

## 2012-07-12 MED ORDER — LACTATED RINGERS IV SOLN
INTRAVENOUS | Status: DC
  Administered 2012-07-12: 100 mL/h via INTRAVENOUS
  Filled 2012-07-12: qty 1000

## 2012-07-12 MED ORDER — PROPOFOL 10 MG/ML IV BOLUS
INTRAVENOUS | Status: DC | PRN
  Administered 2012-07-12: 200 mg via INTRAVENOUS

## 2012-07-12 SURGICAL SUPPLY — 14 items
BAG URINE LEG 19OZ MD ST LTX (BAG) IMPLANT
CATH FOLEY 2WAY SLVR  5CC 16FR (CATHETERS)
CATH FOLEY 2WAY SLVR 5CC 16FR (CATHETERS) IMPLANT
DRESSING TELFA 8X3 (GAUZE/BANDAGES/DRESSINGS) IMPLANT
DRSG TEGADERM 4X4.75 (GAUZE/BANDAGES/DRESSINGS) IMPLANT
DRSG TEGADERM 8X12 (GAUZE/BANDAGES/DRESSINGS) IMPLANT
GAUZE SPONGE 4X4 12PLY STRL LF (GAUZE/BANDAGES/DRESSINGS) IMPLANT
GLOVE BIO SURGEON STRL SZ7.5 (GLOVE) IMPLANT
IV CYSTO TUBING LATEX FREE (IV SOLUTION) IMPLANT
PLUG CATH AND CAP STER (CATHETERS) IMPLANT
SURGILUBE 2OZ TUBE FLIPTOP (MISCELLANEOUS) ×2 IMPLANT
SYRINGE 10CC LL (SYRINGE) IMPLANT
TOWEL OR 17X24 6PK STRL BLUE (TOWEL DISPOSABLE) IMPLANT
UNDERPAD 30X30 INCONTINENT (UNDERPADS AND DIAPERS) ×4 IMPLANT

## 2012-07-12 NOTE — H&P (Signed)
Chief Complaint   cc: Dr. Howell Rucks  cc: Dr. Julieanne Manson   Reason For Visit  Yearly f/u   Active Problems Problems  1. 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 Peyronie's Disease 607.85  History of Present Illness        67 yo married male returns today for a yearly f/u with a hx of BPH & Peyronie's disease.  He has had a TUNA procedure in the past in March 2002.  He has failed Flomax ( dizzy), and retrograde ejaculation. He has now failed Cialis 32m also.  05/16/12  PSA - 3.06 09/13/10  PSA - 1.82  11/02/07  PSA - 1.28   Past Medical History Problems  1. History of  Arthritis V13.4 2. History of  Chronic Lymphocytic Leukemia V10.61 3. History of  Crohn's Disease 555.9 4. History of  Esophageal Reflux 530.81 5. History of  Heart Disease 429.9 6. History of  Heartburn 787.1  Surgical History Problems  1. History of  Cath Stent Placement 2. History of  Inguinal Hernia Repair Bilateral 3. History of  Surg Prostate Transureth Dest Tissue Microwave Thermotherapy  Current Meds 1. Adderall 5 MG Oral Tablet; Therapy: (Recorded:10Jul2012) to 2. Asacol TBEC; Therapy: (Recorded:10Jul2012) to 3. Aspirin 81 MG Oral Tablet; Therapy: (Recorded:07Jul2008) to 4. Cialis 5 MG Oral Tablet; Take 1 tablet daily; Therapy: [id number] (Evaluate:10Dec2013)   Requested for: [id number] Last Rx:14May2013 5. Ibuprofen 600 MG Oral Tablet; Therapy: (Recorded:10Jul2012) to 6. Lipitor 20 MG Oral Tablet; Therapy: (Recorded:07Jul2008) to 7. PriLOSEC OTC TBEC; Therapy: (Recorded:10Jul2012) to  Allergies Medication  1. No Known Drug Allergies  Family History Problems  1. Family history of  Family Health Status Number Of Children 3 sons 2. Family history of  Father Deceased At Age ____ cancer 3. Family history of  Mother Deceased At Age ____ 449 Family history of  No Significant Family History  Social History Problems  1. Alcohol Use 3 per wk 2. Caffeine Use 2-3  ppd 3. Marital History - Currently Married 4. Occupation: cpa 5. Tobacco Use V15.82 1/2 pack X 285yr quit 22 yrs ago  Review of Systems Genitourinary, constitutional, skin, eye, otolaryngeal, hematologic/lymphatic, cardiovascular, pulmonary, endocrine, musculoskeletal, gastrointestinal, neurological and psychiatric system(s) were reviewed and pertinent findings if present are noted.  Genitourinary: urinary frequency, feelings of urinary urgency, nocturia, weak urinary stream, urinary stream starts and stops, incomplete emptying of bladder and initiating urination requires straining.    Vitals Vital Signs [Data Includes: Last 1 Day]  1910FBP10253:54PM  BMI Calculated: 27.35 BSA Calculated: 2.05 Height: 5 ft 10 in Weight: 191 lb  Blood Pressure: 153 / 89 Heart Rate: 80  Results/Data Urine [Data Includes: Last 1 Day]   [id number] YELLOW   APPEARANCE CLEAR   SPECIFIC GRAVITY 1.025   pH 6.0   GLUCOSE NEG mg/dL  BILIRUBIN NEG   KETONE NEG mg/dL  BLOOD NEG   PROTEIN NEG mg/dL  UROBILINOGEN 0.2 mg/dL  NITRITE NEG   LEUKOCYTE ESTERASE NEG   Selected Results  PSA REFLEX TO FREE [id number]:58AM TaCarolan ClinesSPECIMEN TYPE: BLOOD   Test Name Result Flag Reference  PSA 3.06 ng/mL  <=4.00  TEST METHODOLOGY: ECLIA PSA (ELECTROCHEMILUMINESCENCE IMMUNOASSAY)   Assessment Assessed  1. Peyronie's Disease 607.85 2. 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 PSA,Elevated 790.93   BPH , symptomatic with abnormal prostate and in need of  pca-3. He may need PBx. Peyronie's has not changed, except penis has shrunk. No sudafed.  Plan Benign Prostatic Hypertrophy With Urinary Obstruction (600.01)  1. Follow-up Month x 4 Office  Follow-up  Requested for: [id number] PSA,Elevated (790.93)  2. Alfuzosin HCl ER 10 MG Oral Tablet Extended Release 24 Hour; take 1-2 tablets at nighttime as  needed for voiding; Therapy: 27MBO4859 to (Last Rx:19Mar2014)    PBx under anesthesia, because of traumatic experience in the past.   Signatures

## 2012-07-12 NOTE — Transfer of Care (Signed)
Immediate Anesthesia Transfer of Care Note  Patient: Cole Martinez  Procedure(s) Performed: Procedure(s) (LRB): PROSTATE BIOPSY AND ULTRASOUND (N/A)  Patient Location: PACU  Anesthesia Type: General  Level of Consciousness: sleepy  Airway & Oxygen Therapy: Patient Spontanous Breathing and Patient connected to face mask oxygen, oral airway in place.  Post-op Assessment: Report given to PACU RN and Post -op Vital signs reviewed and stable  Post vital signs: Reviewed and stable  Complications: No apparent anesthesia complications

## 2012-07-12 NOTE — Op Note (Signed)
Pre-operative diagnosis : Elevated PSA with previous negative prostate biopsy and positive PCA-3    Postoperative diagnosis:  Same   Operation:  Transrectal needle bladder to the prostate   Surgeon:  S. Gaynelle Arabian, MD  First assistant:   None  Anesthesia:  general  Preparation:   After appropriate preanesthesia, the patient was brought the operating room, placed on the operating table in the dorsal supine position where general LMA anesthesia was introduced. He was then replaced in the left lateral decubitus position where the armband was double checked, and the history was reviewed.   Review history:  Chief Complaint  cc: Dr. Howell Rucks  cc: Dr. Julieanne Manson  Reason For Visit  Yearly f/u  Active Problems  Problems  1. 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 Peyronie's Disease 607.85  History of Present Illness  67 yo married male returns today for a yearly f/u with a hx of BPH & Peyronie's disease. He has had a TUNA procedure in the past in March 2002. He has failed Flomax ( dizzy), and retrograde ejaculation. He has now failed Cialis 45m also.  05/16/12 PSA - 3.06  09/13/10 PSA - 1.82  11/02/07 PSA - 1.28    Statement of  Likelihood of Success: Excellent. TIME-OUT observed.:  Procedure:  The ultrasound probe was placed, and the prostate was evaluated and noted to be the size of 73.366 cc. Multiple areas of calcification were identified. No definite median lobe was identified. Hypoechoic cystic structure was identified in the left mid prostate.  12 transrectal biopsies were obtained in standard fashion and the prostate, beginning at the right base lateral section, and progressing to the right mid lateral portion, then right mid lateral and right mid medial portions, right apex lateral and right apex medial portions, with identical biopsies from the left side. There was no bleeding noted. The patient received IV Cipro, as well as IV Toradol, and IV Tylenol.  He was awakened, taken to recovery room in excellent condition.

## 2012-07-12 NOTE — Anesthesia Procedure Notes (Signed)
Procedure Name: LMA Insertion Date/Time: 07/12/2012 8:20 AM Performed by: Mechele Claude Pre-anesthesia Checklist: Patient identified, Emergency Drugs available, Suction available and Patient being monitored Patient Re-evaluated:Patient Re-evaluated prior to inductionOxygen Delivery Method: Circle System Utilized Preoxygenation: Pre-oxygenation with 100% oxygen Intubation Type: IV induction Ventilation: Mask ventilation without difficulty LMA: LMA inserted LMA Size: 4.0 Number of attempts: 1 Airway Equipment and Method: bite block Placement Confirmation: positive ETCO2 Tube secured with: Tape Dental Injury: Teeth and Oropharynx as per pre-operative assessment

## 2012-07-12 NOTE — Interval H&P Note (Signed)
History and Physical Interval Note:  07/12/2012 8:10 AM  Cole Martinez  has presented today for surgery, with the diagnosis of Elevated PSA  The various methods of treatment have been discussed with the patient and family. After consideration of risks, benefits and other options for treatment, the patient has consented to  Procedure(s): PROSTATE BIOPSY AND ULTRASOUND (N/A) as a surgical intervention .  The patient's history has been reviewed, patient examined, no change in status, stable for surgery.  I have reviewed the patient's chart and labs.  Questions were answered to the patient's satisfaction.   umentation  telephone call: spoke with [name], Mr. Pinedo wife. He is quite anxious. We discussed his normal, but increased psa, and also his + pca-3 value. Also, we discussed his  difficulty with office procedures-rmemmbering how difficult his TUNA was ears ago. Therefore, i would reccommend having PBx with general anesthesia. They will be gon on trip next week, and he would like to have his bx the week of May 5th. I will arrange for hm.   Signatures Electronically signed by : Carolan Clines, M.D.; Jun 14 2012  8:37AM   Carolan Clines I

## 2012-07-12 NOTE — Anesthesia Postprocedure Evaluation (Signed)
Anesthesia Post Note  Patient: Cole Martinez  Procedure(s) Performed: Procedure(s) (LRB): PROSTATE BIOPSY AND ULTRASOUND (N/A)  Anesthesia type: General  Patient location: PACU  Post pain: Pain level controlled  Post assessment: Post-op Vital signs reviewed  Last Vitals:  Filed Vitals:   07/12/12 0915  BP: 115/72  Pulse:   Temp:   Resp: 12    Post vital signs: Reviewed  Level of consciousness: sedated  Complications: No apparent anesthesia complications

## 2012-07-15 ENCOUNTER — Encounter (HOSPITAL_BASED_OUTPATIENT_CLINIC_OR_DEPARTMENT_OTHER): Payer: Self-pay | Admitting: Urology

## 2012-08-01 ENCOUNTER — Other Ambulatory Visit: Payer: Self-pay | Admitting: Gastroenterology

## 2012-09-04 ENCOUNTER — Encounter (HOSPITAL_COMMUNITY): Payer: Self-pay | Admitting: *Deleted

## 2012-09-04 ENCOUNTER — Encounter (HOSPITAL_COMMUNITY): Payer: Self-pay | Admitting: Pharmacy Technician

## 2012-09-10 DIAGNOSIS — E538 Deficiency of other specified B group vitamins: Secondary | ICD-10-CM | POA: Diagnosis not present

## 2012-09-24 ENCOUNTER — Ambulatory Visit (HOSPITAL_COMMUNITY)
Admission: RE | Admit: 2012-09-24 | Discharge: 2012-09-24 | Disposition: A | Discharge status: Home / Self Care | Payer: Medicare Other | Source: Ambulatory Visit | Attending: Gastroenterology | Admitting: Gastroenterology

## 2012-09-24 ENCOUNTER — Ambulatory Visit (HOSPITAL_COMMUNITY): Payer: Medicare Other | Admitting: Anesthesiology

## 2012-09-24 ENCOUNTER — Encounter (HOSPITAL_COMMUNITY)
Admission: RE | Disposition: A | Discharge status: Home / Self Care | Payer: Self-pay | Source: Ambulatory Visit | Attending: Gastroenterology

## 2012-09-24 ENCOUNTER — Encounter (HOSPITAL_COMMUNITY): Payer: Self-pay | Admitting: Anesthesiology

## 2012-09-24 ENCOUNTER — Encounter (HOSPITAL_COMMUNITY): Payer: Self-pay | Admitting: *Deleted

## 2012-09-24 DIAGNOSIS — E7800 Pure hypercholesterolemia, unspecified: Secondary | ICD-10-CM | POA: Insufficient documentation

## 2012-09-24 DIAGNOSIS — K219 Gastro-esophageal reflux disease without esophagitis: Secondary | ICD-10-CM | POA: Insufficient documentation

## 2012-09-24 DIAGNOSIS — I2510 Atherosclerotic heart disease of native coronary artery without angina pectoris: Secondary | ICD-10-CM | POA: Insufficient documentation

## 2012-09-24 DIAGNOSIS — N400 Enlarged prostate without lower urinary tract symptoms: Secondary | ICD-10-CM | POA: Diagnosis not present

## 2012-09-24 DIAGNOSIS — Z86010 Personal history of colonic polyps: Secondary | ICD-10-CM | POA: Diagnosis not present

## 2012-09-24 DIAGNOSIS — D126 Benign neoplasm of colon, unspecified: Secondary | ICD-10-CM | POA: Diagnosis not present

## 2012-09-24 DIAGNOSIS — Z87898 Personal history of other specified conditions: Secondary | ICD-10-CM | POA: Diagnosis not present

## 2012-09-24 DIAGNOSIS — K621 Rectal polyp: Secondary | ICD-10-CM | POA: Insufficient documentation

## 2012-09-24 DIAGNOSIS — K620 Anal polyp: Secondary | ICD-10-CM | POA: Insufficient documentation

## 2012-09-24 DIAGNOSIS — Z1211 Encounter for screening for malignant neoplasm of colon: Secondary | ICD-10-CM | POA: Diagnosis not present

## 2012-09-24 DIAGNOSIS — K5000 Crohn's disease of small intestine without complications: Secondary | ICD-10-CM | POA: Diagnosis not present

## 2012-09-24 DIAGNOSIS — K5090 Crohn's disease, unspecified, without complications: Secondary | ICD-10-CM | POA: Diagnosis not present

## 2012-09-24 HISTORY — PX: COLONOSCOPY WITH PROPOFOL: SHX5780

## 2012-09-24 SURGERY — COLONOSCOPY WITH PROPOFOL
Anesthesia: Monitor Anesthesia Care

## 2012-09-24 MED ORDER — PROPOFOL INFUSION 10 MG/ML OPTIME
INTRAVENOUS | Status: DC | PRN
  Administered 2012-09-24: 160 ug/kg/min via INTRAVENOUS

## 2012-09-24 MED ORDER — KETAMINE HCL 10 MG/ML IJ SOLN
INTRAMUSCULAR | Status: DC | PRN
  Administered 2012-09-24: 10 mg via INTRAVENOUS

## 2012-09-24 MED ORDER — LACTATED RINGERS IV SOLN
INTRAVENOUS | Status: DC
  Administered 2012-09-24: 1000 mL via INTRAVENOUS

## 2012-09-24 MED ORDER — FENTANYL CITRATE 0.05 MG/ML IJ SOLN
INTRAMUSCULAR | Status: DC | PRN
  Administered 2012-09-24: 100 ug via INTRAVENOUS

## 2012-09-24 MED ORDER — SODIUM CHLORIDE 0.9 % IV SOLN: INTRAVENOUS | Status: DC

## 2012-09-24 MED ORDER — LIDOCAINE HCL 1 % IJ SOLN
INTRAMUSCULAR | Status: DC | PRN
  Administered 2012-09-24: 50 mg via INTRADERMAL

## 2012-09-24 MED ORDER — MIDAZOLAM HCL 5 MG/5ML IJ SOLN
INTRAMUSCULAR | Status: DC | PRN
  Administered 2012-09-24: 2 mg via INTRAVENOUS

## 2012-09-24 SURGICAL SUPPLY — 21 items

## 2012-09-24 NOTE — H&P (Signed)
  Procedure: Surveillance colonoscopy. History of adenomatous colon polyps.   History: The patient is a 67 year old male born 12-01-1945. The patient has undergone surveillance colonoscopy exams in the past with removal of adenomatous colon polyps. In 2009, his surveillance colonoscopy was normal.  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 Osteoarthritis. Umbilical attention deficit disorder. Coronary artery disease. Coronary artery stent placed in 1999. Hypercholesterolemia. Gastroesophageal reflux. Benign prostatic hypertrophy. Resting tremor in the right hand. History of adenomatous colon polyps. Tonsillectomy. Removal of a cervical lymph node. Right inguinal hernia repair  Medication allergies: None.  Exam: The patient is alert and lying comfortably on the endoscopy stretcher. Abdomen is soft and nontender to palpation. Lungs are clear to auscultation. Cardiac exam reveals a regular rhythm  Plan: Proceed with  colonoscopy.

## 2012-09-24 NOTE — Op Note (Signed)
Problem: History of adenomatous colon polyps. Chronic Crohn's ileitis.  Endoscopist: Earle Gell  Premedication: Propofol administered by anesthesia  Procedure: Surveillance colonoscopy The patient was placed in the left lateral decubitus position. Anal inspection and digital rectal exam were normal. The Pentax pediatric colonoscope was introduced into the rectum and with significant difficulty due to colonic loop formation, eventually advanced to the cecum as identified by normal-appearing appendiceal orifice and a normal-appearing ileocecal valve. Colonic preparation for the exam today was good.  Rectum. A 3 mm sessile polyp was removed from the mid rectum with the cold biopsy forceps. Retroflex view of the distal rectum was normal.  Sigmoid colon and descending colon. Normal.  Splenic flexure. Normal.  Transverse colon. Normal.  Hepatic flexure. From the hepatic flexure, a 5 mm sessile polyp was removed with the cold snare.  Ascending colon. Normal.  Cecum and ileocecal valve. On the lip of the ileocecal valve there is a 1.5 cm sessile-mushroom appearing, ulcerated polypoid lesion which was biopsied.  Terminal ileum: Scattered aphthous appearing ulcers were present.  Assessment:  #1. A diminutive polyp was removed from the rectum and a 5 mm sessile polyp was removed from the hepatic flexure  #2. A 1.5 cm polypoid lesion on the lip of the ileocecal valve was biopsied.  #3. Scattered aphthous ulcers in the terminal ileum consistent with Crohn's ileitis.  Plan: Await pathology report.

## 2012-09-24 NOTE — Anesthesia Postprocedure Evaluation (Signed)
Anesthesia Post Note  Patient: Cole Martinez  Procedure(s) Performed: Procedure(s) (LRB): COLONOSCOPY WITH PROPOFOL (N/A)  Anesthesia type: MAC  Patient location: PACU  Post pain: Pain level controlled  Post assessment: Post-op Vital signs reviewed  Last Vitals: BP 131/86  Pulse 66  Temp(Src) 36.4 C (Oral)  Resp 24  Ht 5' 8"  (1.727 m)  Wt 191 lb (86.637 kg)  BMI 29.05 kg/m2  SpO2 100%  Post vital signs: Reviewed  Level of consciousness: awake  Complications: No apparent anesthesia complications

## 2012-09-24 NOTE — Transfer of Care (Signed)
Immediate Anesthesia Transfer of Care Note  Patient: Cole Martinez  Procedure(s) Performed: Procedure(s): COLONOSCOPY WITH PROPOFOL (N/A)  Patient Location: PACU and Endoscopy Unit  Anesthesia Type:MAC  Level of Consciousness: oriented, sedated and patient cooperative  Airway & Oxygen Therapy: Patient Spontanous Breathing and Patient connected to face mask oxygen  Post-op Assessment: Report given to PACU RN, Post -op Vital signs reviewed and stable and Patient moving all extremities  Post vital signs: Reviewed and stable  Complications: No apparent anesthesia complications

## 2012-09-24 NOTE — Anesthesia Preprocedure Evaluation (Addendum)
Anesthesia Evaluation  Patient identified by MRN, date of birth, ID band Patient awake    Reviewed: Allergy & Precautions, H&P , NPO status , Patient's Chart, lab work & pertinent test results  Airway Mallampati: II TM Distance: >3 FB Neck ROM: Full    Dental  (+) Teeth Intact and Caps   Pulmonary former smoker,  breath sounds clear to auscultation  Pulmonary exam normal       Cardiovascular + CAD and + Cardiac Stents Rhythm:Regular     Neuro/Psych negative neurological ROS  negative psych ROS   GI/Hepatic negative GI ROS, Neg liver ROS, Crohns disease   Endo/Other  negative endocrine ROS  Renal/GU negative Renal ROS     Musculoskeletal negative musculoskeletal ROS (+)   Abdominal   Peds  Hematology negative hematology ROS (+) Blood dyscrasia, , Hx of CLL, asymptomatic   Anesthesia Other Findings Multiple caps  Reproductive/Obstetrics                           Anesthesia Physical  Anesthesia Plan  ASA: III  Anesthesia Plan: MAC   Post-op Pain Management:    Induction: Intravenous  Airway Management Planned: Simple Face Mask and Natural Airway  Additional Equipment:   Intra-op Plan:   Post-operative Plan:   Informed Consent: I have reviewed the patients History and Physical, chart, labs and discussed the procedure including the risks, benefits and alternatives for the proposed anesthesia with the patient or authorized representative who has indicated his/her understanding and acceptance.   Dental advisory given  Plan Discussed with: CRNA  Anesthesia Plan Comments:         Anesthesia Quick Evaluation

## 2012-09-25 ENCOUNTER — Encounter (HOSPITAL_COMMUNITY): Payer: Self-pay | Admitting: Gastroenterology

## 2012-10-02 DIAGNOSIS — N486 Induration penis plastica: Secondary | ICD-10-CM | POA: Diagnosis not present

## 2012-10-02 DIAGNOSIS — N401 Enlarged prostate with lower urinary tract symptoms: Secondary | ICD-10-CM | POA: Diagnosis not present

## 2012-10-07 DIAGNOSIS — N429 Disorder of prostate, unspecified: Secondary | ICD-10-CM | POA: Diagnosis not present

## 2012-10-09 ENCOUNTER — Other Ambulatory Visit: Payer: Self-pay

## 2012-10-16 DIAGNOSIS — E7800 Pure hypercholesterolemia, unspecified: Secondary | ICD-10-CM | POA: Diagnosis not present

## 2012-10-16 DIAGNOSIS — Z79899 Other long term (current) drug therapy: Secondary | ICD-10-CM | POA: Diagnosis not present

## 2012-10-21 DIAGNOSIS — R259 Unspecified abnormal involuntary movements: Secondary | ICD-10-CM | POA: Diagnosis not present

## 2012-10-21 DIAGNOSIS — K5000 Crohn's disease of small intestine without complications: Secondary | ICD-10-CM | POA: Diagnosis not present

## 2012-10-22 DIAGNOSIS — E7800 Pure hypercholesterolemia, unspecified: Secondary | ICD-10-CM | POA: Diagnosis not present

## 2012-10-22 DIAGNOSIS — I2510 Atherosclerotic heart disease of native coronary artery without angina pectoris: Secondary | ICD-10-CM | POA: Diagnosis not present

## 2012-10-22 DIAGNOSIS — C9111 Chronic lymphocytic leukemia of B-cell type in remission: Secondary | ICD-10-CM | POA: Diagnosis not present

## 2012-10-31 ENCOUNTER — Other Ambulatory Visit: Payer: Medicare Other | Admitting: Lab

## 2012-10-31 ENCOUNTER — Ambulatory Visit: Payer: Medicare Other | Admitting: Oncology

## 2012-11-15 ENCOUNTER — Other Ambulatory Visit (HOSPITAL_BASED_OUTPATIENT_CLINIC_OR_DEPARTMENT_OTHER): Payer: Medicare Other | Admitting: Lab

## 2012-11-15 ENCOUNTER — Telehealth: Payer: Self-pay | Admitting: Oncology

## 2012-11-15 ENCOUNTER — Ambulatory Visit (HOSPITAL_BASED_OUTPATIENT_CLINIC_OR_DEPARTMENT_OTHER): Payer: Medicare Other | Admitting: Oncology

## 2012-11-15 VITALS — BP 144/88 | HR 86 | Temp 97.5°F | Resp 19 | Ht 68.0 in | Wt 197.5 lb

## 2012-11-15 DIAGNOSIS — C9110 Chronic lymphocytic leukemia of B-cell type not having achieved remission: Secondary | ICD-10-CM

## 2012-11-15 LAB — CBC WITH DIFFERENTIAL/PLATELET
BASO%: 0.3 % (ref 0.0–2.0)
Basophils Absolute: 0.1 10*3/uL (ref 0.0–0.1)
EOS%: 0.6 % (ref 0.0–7.0)
Eosinophils Absolute: 0.1 10*3/uL (ref 0.0–0.5)
HCT: 46.9 % (ref 38.4–49.9)
HGB: 15.8 g/dL (ref 13.0–17.1)
LYMPH%: 11 % — ABNORMAL LOW (ref 14.0–49.0)
MCH: 30.3 pg (ref 27.2–33.4)
MCHC: 33.6 g/dL (ref 32.0–36.0)
MCV: 89.9 fL (ref 79.3–98.0)
MONO#: 0.5 10*3/uL (ref 0.1–0.9)
MONO%: 3.1 % (ref 0.0–14.0)
NEUT#: 13.6 10*3/uL — ABNORMAL HIGH (ref 1.5–6.5)
NEUT%: 85 % — ABNORMAL HIGH (ref 39.0–75.0)
Platelets: 234 10*3/uL (ref 140–400)
RBC: 5.22 10*6/uL (ref 4.20–5.82)
RDW: 14.6 % (ref 11.0–14.6)
WBC: 16 10*3/uL — ABNORMAL HIGH (ref 4.0–10.3)
lymph#: 1.8 10*3/uL (ref 0.9–3.3)

## 2012-11-15 NOTE — Telephone Encounter (Signed)
Called pt and left message for Serptember 2015 lab and MD

## 2012-11-15 NOTE — Progress Notes (Signed)
   [place]    OFFICE PROGRESS NOTE   INTERVAL HISTORY:   He returns as scheduled. He is currently being treated with prednisone for Crohn's disease. He has noted increased appetite while on prednisone. No fever, night sweats, or recent infection. No palpable lymph nodes. He takes Celebrex for arthritis.  Objective:  Vital signs in last 24 hours:  Blood pressure 144/88, pulse 86, temperature 97.5 F (36.4 C), temperature source Oral, resp. rate 19, height 5' 8"  (1.727 m), weight 197 lb 8 oz (89.585 kg).    HEENT: No thrush Lymphatics: No cervical, supraclavicular, axillary, or inguinal nodes Resp: Lungs clear bilaterally Cardio: Regular rate and rhythm GI: No hepatosplenomegaly, nontender, no mass Vascular: No leg edema   Lab Results:  Lab Results  Component Value Date   WBC 16.0* 11/15/2012   HGB 15.8 11/15/2012   HCT 46.9 11/15/2012   MCV 89.9 11/15/2012   PLT 234 11/15/2012   ANC 13.6, absolute lymphocyte count 1.8    Medications: I have reviewed the patient's current medications.  Assessment/Plan: 1. Chronic lymphocytic leukemia, diagnosed in 1996. He remains asymptomatic and stable from a hematologic standpoint.       2. pneumococcal vaccine given on 10/18/2010   Disposition:  He remains asymptomatic from the chronic lymphocytic leukemia. The elevated neutrophil count is likely secondary to prednisone. No clinical evidence for progression of the CLL. He will stay up-to-date on the influenza vaccine. Mr. Madole would like to continue followup at the [facility]. He will return for an office visit and CBC in one year.   Betsy Coder, MD  11/15/2012  1:08 PM

## 2012-11-22 ENCOUNTER — Encounter: Payer: Self-pay | Admitting: Neurology

## 2012-11-25 ENCOUNTER — Ambulatory Visit (INDEPENDENT_AMBULATORY_CARE_PROVIDER_SITE_OTHER): Payer: Medicare Other | Admitting: Neurology

## 2012-11-25 ENCOUNTER — Encounter: Payer: Self-pay | Admitting: Neurology

## 2012-11-25 VITALS — BP 139/87 | HR 91 | Ht 68.0 in | Wt 199.0 lb

## 2012-11-25 DIAGNOSIS — G250 Essential tremor: Secondary | ICD-10-CM | POA: Insufficient documentation

## 2012-11-25 DIAGNOSIS — G252 Other specified forms of tremor: Secondary | ICD-10-CM | POA: Insufficient documentation

## 2012-11-25 HISTORY — DX: Essential tremor: G25.0

## 2012-11-25 HISTORY — DX: Essential tremor: G25.2

## 2012-11-25 NOTE — Progress Notes (Signed)
Reason for visit: Tremor  Cole Martinez is a 67 y.o. male  History of present illness:  Cole Martinez is a 67 year old right-handed white male with a history of a tremor that dates back at least 6-7 years. The patient has some involvement of both hands, right greater than left. The patient has had a head and neck tremor as well that occurs intermittently. The patient has wide variations in the severity of the tremor, at times he notes no tremor whatsoever, and other times, the tremor can involve the entire body. The patient generally will note the tremor more when he is emotionally upset. The patient is on stimulant medications including Adderall that he has taken for adult ADD. The patient is also on prednisone currently. The patient has a brother with Parkinson's disease, and he is concerned that he may also have Parkinson's disease. The patient's father had tremors well. The patient was seen by Dr. Erling Cruz in 2010 for the tremor. The patient had problems with tremors several years prior to this evaluation. Over time, the tremor has progressed very slowly. The patient denies any focal tremor. The patient does note some mild gait instability, but he denies falls. The patient has a vitamin B12 deficiency, currently on vitamin B12 injections.  Past Medical History  Diagnosis Date  . Hyperlipidemia   . S/P coronary artery stent placement OCT 1999 OF LAD  . B12 deficiency   . Crohn's disease of ileum SINCE 1988  . H/O adenomatous polyp of colon   . Peyronie disease   . BPH (benign prostatic hypertrophy)   . Tremor, hereditary, benign MILD RIGHT HAND  . Elevated PSA   . Nocturia   . Coronary artery disease CARDIOLOGIST- DR Daneen Schick    S/P STENTING LAD 1999  . ED (erectile dysfunction)   . ADHD (attention deficit hyperactivity disorder)   . Peripheral neuropathy     hx of, none current  . OA (osteoarthritis)   . Chronic lymphocytic leukemia (CLL), T-cell DX 1996--  ONCOLOGIST-  DR [facility]     PT IS ASYMPTOMATIC--- LAST CBC W/ DIFF 06-24-2012 STABLE  . Essential and other specified forms of tremor 11/25/2012    Past Surgical History  Procedure Laterality Date  . Coronary angioplasty with stent placement  OCT 1999    STENT OF LAD  . Laparoscopic inguinal hernia repair Bilateral 01-10-2004    W/ MESH  . Removal left neck lymph node  1996  . Prostate surgery  2002    tuna  . Tonsillectomy  CHILD  . Cardiovascular stress test  11-01-2010 DR Daneen Schick    NORMAL PERFUSION STUDY/ EF 64%/ NO ISCHEMIA  . Prostate biopsy N/A 07/12/2012    Procedure: PROSTATE BIOPSY AND ULTRASOUND;  Surgeon: Ailene Rud, MD;  Location: [facility];  Service: Urology;  Laterality: N/A;  . Neck benign removed from neck  yrs ago  . Colonoscopy with propofol N/A 09/24/2012    Procedure: COLONOSCOPY WITH PROPOFOL;  Surgeon: [name], MD;  Location: [facility];  Service: Endoscopy;  Laterality: N/A;    Family History  Problem Relation Age of Onset  . Obesity Brother   . Diabetes Brother   . Parkinsonism Brother     Social history:  reports that he quit smoking about 28 years ago. His smoking use included Cigarettes. He has a 10 pack-year smoking history. He has never used smokeless tobacco. He reports that he drinks about 1.0 ounces of alcohol per week. He  reports that he does not use illicit drugs.  Medications:  Current Outpatient Prescriptions on File Prior to Visit  Medication Sig Dispense Refill  . amphetamine-dextroamphetamine (ADDERALL) 5 MG tablet Take 5 mg by mouth 2 (two) times daily.      Marland Kitchen aspirin EC 81 MG tablet Take 81 mg by mouth daily.      Marland Kitchen atorvastatin (LIPITOR) 20 MG tablet Take 20 mg by mouth every morning.       . celecoxib (CELEBREX) 50 MG capsule Take 50 mg by mouth daily.      . Cyanocobalamin (VITAMIN B-12 IJ) Inject 1,000 mcg as directed every 3 (three) months.       Marland Kitchen omeprazole (PRILOSEC) 20 MG capsule Take 20 mg by mouth daily.       . predniSONE (DELTASONE) 10 MG tablet daily. Started 10/24/12--Takes 3 tablets for 3 weeks then 1 1/2 tablets for 3 weeks, 1 tablets for 3 weeks, then 1 1/2 to 1 until wean off.      . silodosin (RAPAFLO) 8 MG CAPS capsule Take 8 mg by mouth daily after supper.      . DELZICOL 400 MG CPDR DR capsule Take 400 mg by mouth daily.       No current facility-administered medications on file prior to visit.     No Known Allergies  ROS:  Out of a complete 14 system review of symptoms, the patient complains only of the following symptoms, and all other reviewed systems are negative.  Tremor Urination problems, impotence Runny nose Snoring, restless legs  Blood pressure 139/87, pulse 91, height 5' 8"  (1.727 m), weight 199 lb (90.266 kg).  Physical Exam  General: The patient is alert and cooperative at the time of the examination.  Head: Pupils are equal, round, and reactive to light. Discs are flat bilaterally.  Neck: The neck is supple, no carotid bruits are noted.  Respiratory: The respiratory examination is clear.  Cardiovascular: The cardiovascular examination reveals a regular rate and rhythm, no obvious murmurs or rubs are noted.  Neuromuscular: The patient has a slight depression of the left shoulder relative to the right.  Skin: Extremities are without significant edema.  Neurologic Exam  Mental status:  Cranial nerves: Facial symmetry is present. There is good sensation of the face to pinprick and soft touch bilaterally. The strength of the facial muscles and the muscles to head turning and shoulder shrug are normal bilaterally. Speech is well enunciated, no aphasia or dysarthria is noted. Extraocular movements are full. Visual fields are full. A slight headache and neck tremor is noted.  Motor: The motor testing reveals 5 over 5 strength of all 4 extremities. Good symmetric motor tone is noted throughout.  Sensory: Sensory testing is intact to pinprick, soft touch,  vibration sensation, and position sense on all 4 extremities, with the exception that there is a stocking pattern pinprick sensory deficit in the distal one third of the legs bilaterally. No evidence of extinction is noted.  Coordination: Cerebellar testing reveals good finger-nose-finger and heel-to-shin bilaterally. No tremor is seen with finger-nose-finger.  Gait and station: Gait is normal. Tandem gait is slightly unsteady. Romberg is negative. No drift is seen.  Reflexes: Deep tendon reflexes are symmetric and normal bilaterally. The ankle jerk reflexes are well-maintained bilaterally. Toes are downgoing bilaterally.   Assessment/Plan:  1. Benign essential tremor  The patient has features of a benign essential tremor, not parkinsonism. The tremor is most pronounced in the head and neck, not with the  arms. Currently, the tremor is not a disability for him. The patient does not require medical therapy at this time. If the tremor progresses, and the patient desires medical therapy, he will contact our office. Otherwise, the patient will followup if needed. No further workup is required at this time.  Jill Alexanders MD 11/25/2012 9:49 PM  [facility] [address] [place], [place] [address]  Phone [phone] Fax [phone]

## 2012-11-27 DIAGNOSIS — IMO0002 Reserved for concepts with insufficient information to code with codable children: Secondary | ICD-10-CM | POA: Diagnosis not present

## 2012-12-04 DIAGNOSIS — N486 Induration penis plastica: Secondary | ICD-10-CM | POA: Diagnosis not present

## 2012-12-04 DIAGNOSIS — N401 Enlarged prostate with lower urinary tract symptoms: Secondary | ICD-10-CM | POA: Diagnosis not present

## 2012-12-04 DIAGNOSIS — N529 Male erectile dysfunction, unspecified: Secondary | ICD-10-CM | POA: Diagnosis not present

## 2012-12-10 DIAGNOSIS — Z23 Encounter for immunization: Secondary | ICD-10-CM | POA: Diagnosis not present

## 2012-12-11 DIAGNOSIS — E538 Deficiency of other specified B group vitamins: Secondary | ICD-10-CM | POA: Diagnosis not present

## 2013-01-02 DIAGNOSIS — H2510 Age-related nuclear cataract, unspecified eye: Secondary | ICD-10-CM | POA: Diagnosis not present

## 2013-01-09 ENCOUNTER — Other Ambulatory Visit: Payer: Self-pay

## 2013-03-13 DIAGNOSIS — E538 Deficiency of other specified B group vitamins: Secondary | ICD-10-CM | POA: Diagnosis not present

## 2013-04-02 DIAGNOSIS — K5090 Crohn's disease, unspecified, without complications: Secondary | ICD-10-CM | POA: Diagnosis not present

## 2013-04-02 DIAGNOSIS — C9110 Chronic lymphocytic leukemia of B-cell type not having achieved remission: Secondary | ICD-10-CM | POA: Diagnosis not present

## 2013-04-02 DIAGNOSIS — R5383 Other fatigue: Secondary | ICD-10-CM | POA: Diagnosis not present

## 2013-04-02 DIAGNOSIS — R5381 Other malaise: Secondary | ICD-10-CM | POA: Diagnosis not present

## 2013-04-02 DIAGNOSIS — R42 Dizziness and giddiness: Secondary | ICD-10-CM | POA: Diagnosis not present

## 2013-04-02 DIAGNOSIS — R0609 Other forms of dyspnea: Secondary | ICD-10-CM | POA: Diagnosis not present

## 2013-04-21 DIAGNOSIS — K5090 Crohn's disease, unspecified, without complications: Secondary | ICD-10-CM | POA: Diagnosis not present

## 2013-05-19 DIAGNOSIS — E538 Deficiency of other specified B group vitamins: Secondary | ICD-10-CM | POA: Diagnosis not present

## 2013-06-19 DIAGNOSIS — R972 Elevated prostate specific antigen [PSA]: Secondary | ICD-10-CM | POA: Diagnosis not present

## 2013-06-19 DIAGNOSIS — N486 Induration penis plastica: Secondary | ICD-10-CM | POA: Diagnosis not present

## 2013-06-23 DIAGNOSIS — N401 Enlarged prostate with lower urinary tract symptoms: Secondary | ICD-10-CM | POA: Diagnosis not present

## 2013-06-23 DIAGNOSIS — N529 Male erectile dysfunction, unspecified: Secondary | ICD-10-CM | POA: Diagnosis not present

## 2013-06-23 DIAGNOSIS — N486 Induration penis plastica: Secondary | ICD-10-CM | POA: Diagnosis not present

## 2013-06-23 DIAGNOSIS — N138 Other obstructive and reflux uropathy: Secondary | ICD-10-CM | POA: Diagnosis not present

## 2013-06-23 DIAGNOSIS — E291 Testicular hypofunction: Secondary | ICD-10-CM | POA: Diagnosis not present

## 2013-06-30 DIAGNOSIS — E538 Deficiency of other specified B group vitamins: Secondary | ICD-10-CM | POA: Diagnosis not present

## 2013-06-30 DIAGNOSIS — Z23 Encounter for immunization: Secondary | ICD-10-CM | POA: Diagnosis not present

## 2013-06-30 DIAGNOSIS — F909 Attention-deficit hyperactivity disorder, unspecified type: Secondary | ICD-10-CM | POA: Diagnosis not present

## 2013-06-30 DIAGNOSIS — E291 Testicular hypofunction: Secondary | ICD-10-CM | POA: Diagnosis not present

## 2013-06-30 DIAGNOSIS — E7800 Pure hypercholesterolemia, unspecified: Secondary | ICD-10-CM | POA: Diagnosis not present

## 2013-06-30 DIAGNOSIS — C9110 Chronic lymphocytic leukemia of B-cell type not having achieved remission: Secondary | ICD-10-CM | POA: Diagnosis not present

## 2013-06-30 DIAGNOSIS — K5000 Crohn's disease of small intestine without complications: Secondary | ICD-10-CM | POA: Diagnosis not present

## 2013-06-30 DIAGNOSIS — Z0000 Encounter for general adult medical examination without abnormal findings: Secondary | ICD-10-CM | POA: Diagnosis not present

## 2013-06-30 DIAGNOSIS — Z86010 Personal history of colonic polyps: Secondary | ICD-10-CM | POA: Diagnosis not present

## 2013-06-30 DIAGNOSIS — N400 Enlarged prostate without lower urinary tract symptoms: Secondary | ICD-10-CM | POA: Diagnosis not present

## 2013-07-02 DIAGNOSIS — E291 Testicular hypofunction: Secondary | ICD-10-CM | POA: Diagnosis not present

## 2013-07-02 DIAGNOSIS — N139 Obstructive and reflux uropathy, unspecified: Secondary | ICD-10-CM | POA: Diagnosis not present

## 2013-07-02 DIAGNOSIS — N401 Enlarged prostate with lower urinary tract symptoms: Secondary | ICD-10-CM | POA: Diagnosis not present

## 2013-07-02 DIAGNOSIS — N138 Other obstructive and reflux uropathy: Secondary | ICD-10-CM | POA: Diagnosis not present

## 2013-07-02 DIAGNOSIS — N486 Induration penis plastica: Secondary | ICD-10-CM | POA: Diagnosis not present

## 2013-07-14 ENCOUNTER — Telehealth: Payer: Self-pay | Admitting: Interventional Cardiology

## 2013-07-14 ENCOUNTER — Telehealth: Payer: Self-pay

## 2013-07-14 NOTE — Telephone Encounter (Signed)
New message     Need clearance for TURP procedure.  Clearance was faxed days ago.  Please fax clearance to [phone]

## 2013-07-14 NOTE — Telephone Encounter (Signed)
error 

## 2013-07-15 ENCOUNTER — Other Ambulatory Visit: Payer: Self-pay | Admitting: Urology

## 2013-07-15 NOTE — Telephone Encounter (Signed)
Cardiac clearance faxed to [facility] attn: Dr.Tannenbaum fax [phone]

## 2013-07-30 ENCOUNTER — Encounter (HOSPITAL_COMMUNITY): Payer: Self-pay | Admitting: Pharmacy Technician

## 2013-08-04 ENCOUNTER — Encounter (HOSPITAL_COMMUNITY): Payer: Self-pay

## 2013-08-04 ENCOUNTER — Encounter (HOSPITAL_COMMUNITY)
Admission: RE | Admit: 2013-08-04 | Discharge: 2013-08-04 | Disposition: A | Discharge status: Home / Self Care | Payer: Medicare Other | Source: Ambulatory Visit | Attending: Urology | Admitting: Urology

## 2013-08-04 DIAGNOSIS — Z01812 Encounter for preprocedural laboratory examination: Secondary | ICD-10-CM | POA: Diagnosis not present

## 2013-08-04 LAB — CBC
HCT: 41.5 % (ref 39.0–52.0)
Hemoglobin: 14.6 g/dL (ref 13.0–17.0)
MCH: 31.1 pg (ref 26.0–34.0)
MCHC: 35.2 g/dL (ref 30.0–36.0)
MCV: 88.3 fL (ref 78.0–100.0)
Platelets: 210 10*3/uL (ref 150–400)
RBC: 4.7 MIL/uL (ref 4.22–5.81)
RDW: 13.4 % (ref 11.5–15.5)
WBC: 6.9 10*3/uL (ref 4.0–10.5)

## 2013-08-04 LAB — BASIC METABOLIC PANEL
BUN: 25 mg/dL — ABNORMAL HIGH (ref 6–23)
CO2: 27 mEq/L (ref 19–32)
Calcium: 9.4 mg/dL (ref 8.4–10.5)
Chloride: 103 mEq/L (ref 96–112)
Creatinine, Ser: 1.08 mg/dL (ref 0.50–1.35)
GFR calc Af Amer: 79 mL/min — ABNORMAL LOW (ref >90)
GFR calc non Af Amer: 69 mL/min — ABNORMAL LOW (ref >90)
Glucose, Bld: 92 mg/dL (ref 70–99)
Potassium: 5.1 mEq/L (ref 3.7–5.3)
Sodium: 140 mEq/L (ref 137–147)

## 2013-08-04 NOTE — Patient Instructions (Addendum)
[place]  08/04/2013   Your procedure is scheduled on:  6-5 -2015  Enter through [facility] Entrance and follow signs to [facility]. Arrive at  0700     AM .  Call this number if you have problems the morning of surgery: [phone]  Or Presurgical Testing [phone](Terik Haughey) For Living Will and/or Health Care Power Attorney Forms: please provide copy for your medical record,may bring AM of surgery(Forms should be already notarized -we do not provide this service).(Yes, will bring Living will/ HCPOA forms AM of surgery.  Remember: Follow any bowel prep instructions per MD office.    Do not eat food:After Midnight.    Take these medicines the morning of surgery with A SIP OF WATER: none-pt. Prefers.   Do not wear jewelry, make-up or nail polish.  Do not wear lotions, powders, or perfumes. You may wear deodorant.  Do not shave 48 hours(2 days) prior to first CHG shower(legs and under arms).(Shaving face and neck okay.)  Do not bring valuables to the hospital.(Hospital is not responsible for lost valuables).  Contacts, dentures or removable bridgework, body piercing, hair pins may not be worn into surgery.  Leave suitcase in the car. After surgery it may be brought to your room.  For patients admitted to the hospital, checkout time is 11:00 AM the day of discharge.(Restricted visitors-Any Persons displaying flu-like symptoms or illness).    Patients discharged the day of surgery will not be allowed to drive home. Must have responsible person with you x 24 hours once discharged.  Name and phone number of your driver: [name] -spouse [phone] cell Special Instructions: CHG(Chlorhedine 4%-"Hibiclens","Betasept","Aplicare") Shower Use Special Wash: see special instructions.(avoid face and genitals)   Please read over the following fact sheets that you were given: MRSA Information, Blood Transfusion fact sheet, Incentive Spirometry Instruction.    Failure to  follow these instructions may result in Cancellation of your surgery.   ____________________    [facility] - Preparing for Surgery Before surgery, you can play an important role.  Because skin is not sterile, your skin needs to be as free of germs as possible.  You can reduce the number of germs on your skin by washing with CHG (chlorahexidine gluconate) soap before surgery.  CHG is an antiseptic cleaner which kills germs and bonds with the skin to continue killing germs even after washing. Please DO NOT use if you have an allergy to CHG or antibacterial soaps.  If your skin becomes reddened/irritated stop using the CHG and inform your nurse when you arrive at Short Stay. Do not shave (including legs and underarms) for at least 48 hours prior to the first CHG shower.  You may shave your face/neck. Please follow these instructions carefully:  1.  Shower with CHG Soap the night before surgery and the  morning of Surgery.  2.  If you choose to wash your hair, wash your hair first as usual with your  normal  shampoo.  3.  After you shampoo, rinse your hair and body thoroughly to remove the  shampoo.                           4.  Use CHG as you would any other liquid soap.  You can apply chg directly  to the skin and wash  Gently with a scrungie or clean washcloth.  5.  Apply the CHG Soap to your body ONLY FROM THE NECK DOWN.   Do not use on face/ open                           Wound or open sores. Avoid contact with eyes, ears mouth and genitals (private parts).                       Wash face,  Genitals (private parts) with your normal soap.             6.  Wash thoroughly, paying special attention to the area where your surgery  will be performed.  7.  Thoroughly rinse your body with warm water from the neck down.  8.  DO NOT shower/wash with your normal soap after using and rinsing off  the CHG Soap.                9.  Pat yourself dry with a clean towel.            10.  Wear  clean pajamas.            11.  Place clean sheets on your bed the night of your first shower and do not  sleep with pets. Day of Surgery : Do not apply any lotions/deodorants the morning of surgery.  Please wear clean clothes to the hospital/surgery center.  FAILURE TO FOLLOW THESE INSTRUCTIONS MAY RESULT IN THE CANCELLATION OF YOUR SURGERY PATIENT SIGNATURE_________________________________  NURSE SIGNATURE__________________________________  ________________________________________________________________________

## 2013-08-04 NOTE — Progress Notes (Signed)
08-04-13 labs viewable in Epic, note BUN.

## 2013-08-04 NOTE — Pre-Procedure Instructions (Addendum)
08-04-13 EKG 10-24-12/Stress 8'12 -reports with chart. 08-04-13 1245 Note to Dr. Arlyn Leak office to note viewable labs in [place].

## 2013-08-08 ENCOUNTER — Encounter (HOSPITAL_COMMUNITY)
Admission: RE | Disposition: A | Discharge status: Home / Self Care | Payer: Self-pay | Source: Ambulatory Visit | Attending: Urology

## 2013-08-08 ENCOUNTER — Encounter (HOSPITAL_COMMUNITY): Payer: Medicare Other | Admitting: Certified Registered Nurse Anesthetist

## 2013-08-08 ENCOUNTER — Inpatient Hospital Stay (HOSPITAL_COMMUNITY): Payer: Medicare Other | Admitting: Certified Registered Nurse Anesthetist

## 2013-08-08 ENCOUNTER — Inpatient Hospital Stay (HOSPITAL_COMMUNITY)
Admission: RE | Admit: 2013-08-08 | Discharge: 2013-08-10 | DRG: 714 | Disposition: A | Discharge status: Home / Self Care | Payer: Medicare Other | Source: Ambulatory Visit | Attending: Urology | Admitting: Urology

## 2013-08-08 ENCOUNTER — Encounter (HOSPITAL_COMMUNITY): Payer: Self-pay | Admitting: *Deleted

## 2013-08-08 DIAGNOSIS — Z9861 Coronary angioplasty status: Secondary | ICD-10-CM | POA: Diagnosis not present

## 2013-08-08 DIAGNOSIS — E785 Hyperlipidemia, unspecified: Secondary | ICD-10-CM | POA: Diagnosis present

## 2013-08-08 DIAGNOSIS — Z860100 Personal history of colon polyps, unspecified: Secondary | ICD-10-CM

## 2013-08-08 DIAGNOSIS — N1370 Vesicoureteral-reflux, unspecified: Secondary | ICD-10-CM | POA: Diagnosis present

## 2013-08-08 DIAGNOSIS — N400 Enlarged prostate without lower urinary tract symptoms: Secondary | ICD-10-CM | POA: Diagnosis present

## 2013-08-08 DIAGNOSIS — N529 Male erectile dysfunction, unspecified: Secondary | ICD-10-CM | POA: Diagnosis present

## 2013-08-08 DIAGNOSIS — I771 Stricture of artery: Secondary | ICD-10-CM | POA: Diagnosis present

## 2013-08-08 DIAGNOSIS — Z856 Personal history of leukemia: Secondary | ICD-10-CM | POA: Diagnosis not present

## 2013-08-08 DIAGNOSIS — N401 Enlarged prostate with lower urinary tract symptoms: Principal | ICD-10-CM | POA: Diagnosis present

## 2013-08-08 DIAGNOSIS — N138 Other obstructive and reflux uropathy: Principal | ICD-10-CM | POA: Diagnosis present

## 2013-08-08 DIAGNOSIS — K219 Gastro-esophageal reflux disease without esophagitis: Secondary | ICD-10-CM | POA: Diagnosis present

## 2013-08-08 DIAGNOSIS — I2510 Atherosclerotic heart disease of native coronary artery without angina pectoris: Secondary | ICD-10-CM | POA: Diagnosis not present

## 2013-08-08 DIAGNOSIS — Z01812 Encounter for preprocedural laboratory examination: Secondary | ICD-10-CM

## 2013-08-08 DIAGNOSIS — N486 Induration penis plastica: Secondary | ICD-10-CM | POA: Diagnosis present

## 2013-08-08 DIAGNOSIS — E291 Testicular hypofunction: Secondary | ICD-10-CM | POA: Diagnosis present

## 2013-08-08 DIAGNOSIS — R350 Frequency of micturition: Secondary | ICD-10-CM | POA: Diagnosis not present

## 2013-08-08 DIAGNOSIS — R972 Elevated prostate specific antigen [PSA]: Secondary | ICD-10-CM | POA: Diagnosis present

## 2013-08-08 DIAGNOSIS — N4289 Other specified disorders of prostate: Secondary | ICD-10-CM | POA: Diagnosis not present

## 2013-08-08 DIAGNOSIS — Z87891 Personal history of nicotine dependence: Secondary | ICD-10-CM | POA: Diagnosis not present

## 2013-08-08 DIAGNOSIS — N320 Bladder-neck obstruction: Secondary | ICD-10-CM | POA: Diagnosis present

## 2013-08-08 HISTORY — PX: TRANSURETHRAL RESECTION OF PROSTATE: SHX73

## 2013-08-08 SURGERY — TRANSURETHRAL RESECTION OF THE PROSTATE WITH GYRUS INSTRUMENTS
Anesthesia: General

## 2013-08-08 MED ORDER — HYDROMORPHONE HCL PF 1 MG/ML IJ SOLN
0.5000 mg | INTRAMUSCULAR | Status: DC | PRN
  Administered 2013-08-10: 1 mg via INTRAVENOUS
  Filled 2013-08-08: qty 1

## 2013-08-08 MED ORDER — PHENAZOPYRIDINE HCL 200 MG PO TABS: 200.0000 mg | ORAL_TABLET | Freq: Three times a day (TID) | ORAL | Status: DC | PRN

## 2013-08-08 MED ORDER — PROPOFOL 10 MG/ML IV BOLUS
INTRAVENOUS | Status: AC
  Filled 2013-08-08: qty 20

## 2013-08-08 MED ORDER — PHENYLEPHRINE 40 MCG/ML (10ML) SYRINGE FOR IV PUSH (FOR BLOOD PRESSURE SUPPORT)
PREFILLED_SYRINGE | INTRAVENOUS | Status: AC
  Filled 2013-08-08: qty 10

## 2013-08-08 MED ORDER — PROMETHAZINE HCL 25 MG/ML IJ SOLN: 6.2500 mg | INTRAMUSCULAR | Status: DC | PRN

## 2013-08-08 MED ORDER — HYDROMORPHONE HCL PF 1 MG/ML IJ SOLN: 0.2500 mg | INTRAMUSCULAR | Status: DC | PRN

## 2013-08-08 MED ORDER — SENNOSIDES-DOCUSATE SODIUM 8.6-50 MG PO TABS
2.0000 | ORAL_TABLET | Freq: Every day | ORAL | Status: DC
  Administered 2013-08-08: 2 via ORAL
  Filled 2013-08-08 (×3): qty 2

## 2013-08-08 MED ORDER — MIDAZOLAM HCL 2 MG/2ML IJ SOLN
INTRAMUSCULAR | Status: AC
  Filled 2013-08-08: qty 2

## 2013-08-08 MED ORDER — BISACODYL 10 MG RE SUPP: 10.0000 mg | Freq: Every day | RECTAL | Status: DC | PRN

## 2013-08-08 MED ORDER — MIDAZOLAM HCL 5 MG/5ML IJ SOLN
INTRAMUSCULAR | Status: DC | PRN
  Administered 2013-08-08: 1 mg via INTRAVENOUS

## 2013-08-08 MED ORDER — KETOROLAC TROMETHAMINE 15 MG/ML IJ SOLN
INTRAMUSCULAR | Status: DC | PRN
  Administered 2013-08-08: 15 mg via INTRAVENOUS

## 2013-08-08 MED ORDER — KETOROLAC TROMETHAMINE 30 MG/ML IJ SOLN
INTRAMUSCULAR | Status: AC
  Filled 2013-08-08: qty 1

## 2013-08-08 MED ORDER — MAGNESIUM CITRATE PO SOLN: 1.0000 | Freq: Once | ORAL | Status: AC | PRN

## 2013-08-08 MED ORDER — CIPROFLOXACIN HCL 500 MG PO TABS
500.0000 mg | ORAL_TABLET | Freq: Two times a day (BID) | ORAL | Status: DC
  Administered 2013-08-08 – 2013-08-10 (×4): 500 mg via ORAL
  Filled 2013-08-08 (×6): qty 1

## 2013-08-08 MED ORDER — BELLADONNA ALKALOIDS-OPIUM 16.2-60 MG RE SUPP
RECTAL | Status: DC | PRN
  Administered 2013-08-08: 1 via RECTAL

## 2013-08-08 MED ORDER — AMPHETAMINE-DEXTROAMPHETAMINE 5 MG PO TABS
5.0000 mg | ORAL_TABLET | Freq: Two times a day (BID) | ORAL | Status: DC
Start: 2013-08-08 — End: 2013-08-08

## 2013-08-08 MED ORDER — DIPHENHYDRAMINE HCL 12.5 MG/5ML PO ELIX: 12.5000 mg | ORAL_SOLUTION | Freq: Four times a day (QID) | ORAL | Status: DC | PRN

## 2013-08-08 MED ORDER — LACTATED RINGERS IV SOLN
INTRAVENOUS | Status: DC
  Administered 2013-08-08: 11:00:00 via INTRAVENOUS
  Administered 2013-08-08: 1000 mL via INTRAVENOUS

## 2013-08-08 MED ORDER — PANTOPRAZOLE SODIUM 40 MG PO TBEC
40.0000 mg | DELAYED_RELEASE_TABLET | Freq: Every day | ORAL | Status: DC
  Administered 2013-08-09 – 2013-08-10 (×2): 40 mg via ORAL
  Filled 2013-08-08 (×2): qty 1

## 2013-08-08 MED ORDER — PHENYLEPHRINE HCL 10 MG/ML IJ SOLN
INTRAMUSCULAR | Status: DC | PRN
  Administered 2013-08-08 (×3): 80 ug via INTRAVENOUS

## 2013-08-08 MED ORDER — AMPHETAMINE-DEXTROAMPHETAMINE 10 MG PO TABS
5.0000 mg | ORAL_TABLET | Freq: Two times a day (BID) | ORAL | Status: DC
  Administered 2013-08-08 – 2013-08-10 (×4): 5 mg via ORAL
  Filled 2013-08-08 (×4): qty 1

## 2013-08-08 MED ORDER — ONDANSETRON HCL 4 MG/2ML IJ SOLN
INTRAMUSCULAR | Status: DC | PRN
  Administered 2013-08-08: 4 mg via INTRAVENOUS

## 2013-08-08 MED ORDER — CEFAZOLIN SODIUM-DEXTROSE 2-3 GM-% IV SOLR
INTRAVENOUS | Status: AC
  Filled 2013-08-08: qty 50

## 2013-08-08 MED ORDER — OXYCODONE HCL 5 MG PO TABS: 5.0000 mg | ORAL_TABLET | Freq: Once | ORAL | Status: DC | PRN

## 2013-08-08 MED ORDER — BELLADONNA ALKALOIDS-OPIUM 16.2-60 MG RE SUPP
RECTAL | Status: AC
  Filled 2013-08-08: qty 1

## 2013-08-08 MED ORDER — CIPROFLOXACIN HCL 500 MG PO TABS: 500.0000 mg | ORAL_TABLET | Freq: Two times a day (BID) | ORAL | Status: DC

## 2013-08-08 MED ORDER — OXYCODONE HCL 5 MG/5ML PO SOLN
5.0000 mg | Freq: Once | ORAL | Status: DC | PRN
  Filled 2013-08-08: qty 5

## 2013-08-08 MED ORDER — CEFAZOLIN SODIUM-DEXTROSE 2-3 GM-% IV SOLR
2.0000 g | INTRAVENOUS | Status: AC
  Administered 2013-08-08: 2 g via INTRAVENOUS

## 2013-08-08 MED ORDER — DEXAMETHASONE SODIUM PHOSPHATE 10 MG/ML IJ SOLN
INTRAMUSCULAR | Status: DC | PRN
  Administered 2013-08-08: 10 mg via INTRAVENOUS

## 2013-08-08 MED ORDER — DIPHENHYDRAMINE HCL 50 MG/ML IJ SOLN: 12.5000 mg | Freq: Four times a day (QID) | INTRAMUSCULAR | Status: DC | PRN

## 2013-08-08 MED ORDER — LIDOCAINE HCL (CARDIAC) 20 MG/ML IV SOLN
INTRAVENOUS | Status: DC | PRN
  Administered 2013-08-08: 80 mg via INTRAVENOUS

## 2013-08-08 MED ORDER — OXYBUTYNIN CHLORIDE 5 MG PO TABS
5.0000 mg | ORAL_TABLET | Freq: Three times a day (TID) | ORAL | Status: DC | PRN
  Filled 2013-08-08: qty 1

## 2013-08-08 MED ORDER — AMPHETAMINE-DEXTROAMPHETAMINE 10 MG PO TABS
5.0000 mg | ORAL_TABLET | Freq: Once | ORAL | Status: AC
  Administered 2013-08-08: 5 mg via ORAL
  Filled 2013-08-08: qty 1

## 2013-08-08 MED ORDER — PROPOFOL 10 MG/ML IV BOLUS
INTRAVENOUS | Status: DC | PRN
  Administered 2013-08-08: 170 mg via INTRAVENOUS
  Administered 2013-08-08: 30 mg via INTRAVENOUS

## 2013-08-08 MED ORDER — ONDANSETRON HCL 4 MG/2ML IJ SOLN: 4.0000 mg | INTRAMUSCULAR | Status: DC | PRN

## 2013-08-08 MED ORDER — OXYBUTYNIN CHLORIDE 5 MG PO TABS: ORAL_TABLET | ORAL | Status: DC

## 2013-08-08 MED ORDER — ONDANSETRON HCL 4 MG/2ML IJ SOLN
INTRAMUSCULAR | Status: AC
  Filled 2013-08-08: qty 2

## 2013-08-08 MED ORDER — MEPERIDINE HCL 50 MG/ML IJ SOLN: 6.2500 mg | INTRAMUSCULAR | Status: DC | PRN

## 2013-08-08 MED ORDER — BACITRACIN-NEOMYCIN-POLYMYXIN 400-5-5000 EX OINT: 1.0000 "application " | TOPICAL_OINTMENT | Freq: Three times a day (TID) | CUTANEOUS | Status: DC | PRN

## 2013-08-08 MED ORDER — ATORVASTATIN CALCIUM 20 MG PO TABS
20.0000 mg | ORAL_TABLET | Freq: Every morning | ORAL | Status: DC
  Administered 2013-08-09 – 2013-08-10 (×2): 20 mg via ORAL
  Filled 2013-08-08 (×2): qty 1

## 2013-08-08 MED ORDER — ACETAMINOPHEN 10 MG/ML IV SOLN
1000.0000 mg | Freq: Once | INTRAVENOUS | Status: AC
  Administered 2013-08-08: 1000 mg via INTRAVENOUS
  Filled 2013-08-08: qty 100

## 2013-08-08 MED ORDER — EPHEDRINE SULFATE 50 MG/ML IJ SOLN
INTRAMUSCULAR | Status: DC | PRN
  Administered 2013-08-08 (×2): 10 mg via INTRAVENOUS

## 2013-08-08 MED ORDER — DEXAMETHASONE SODIUM PHOSPHATE 10 MG/ML IJ SOLN
INTRAMUSCULAR | Status: AC
  Filled 2013-08-08: qty 1

## 2013-08-08 MED ORDER — SODIUM CHLORIDE 0.45 % IV SOLN
INTRAVENOUS | Status: DC
  Administered 2013-08-08: 50 mL/h via INTRAVENOUS
  Administered 2013-08-08: 11:00:00 via INTRAVENOUS

## 2013-08-08 MED ORDER — TRAMADOL-ACETAMINOPHEN 37.5-325 MG PO TABS: 1.0000 | ORAL_TABLET | Freq: Four times a day (QID) | ORAL | Status: DC | PRN

## 2013-08-08 MED ORDER — ACETAMINOPHEN 325 MG PO TABS: 650.0000 mg | ORAL_TABLET | ORAL | Status: DC | PRN

## 2013-08-08 MED ORDER — SODIUM CHLORIDE 0.9 % IR SOLN
Status: DC | PRN
  Administered 2013-08-08: 16000 mL

## 2013-08-08 MED ORDER — FENTANYL CITRATE 0.05 MG/ML IJ SOLN
INTRAMUSCULAR | Status: AC
  Filled 2013-08-08: qty 5

## 2013-08-08 MED ORDER — LIDOCAINE HCL (CARDIAC) 20 MG/ML IV SOLN
INTRAVENOUS | Status: AC
  Filled 2013-08-08: qty 5

## 2013-08-08 MED ORDER — OXYCODONE-ACETAMINOPHEN 5-325 MG PO TABS: 1.0000 | ORAL_TABLET | ORAL | Status: DC | PRN

## 2013-08-08 MED ORDER — FENTANYL CITRATE 0.05 MG/ML IJ SOLN
INTRAMUSCULAR | Status: DC | PRN
  Administered 2013-08-08 (×2): 25 ug via INTRAVENOUS
  Administered 2013-08-08 (×2): 50 ug via INTRAVENOUS
  Administered 2013-08-08 (×2): 25 ug via INTRAVENOUS

## 2013-08-08 SURGICAL SUPPLY — 19 items
BAG URINE DRAINAGE (UROLOGICAL SUPPLIES) ×2 IMPLANT
BAG URO CATCHER STRL LF (DRAPE) ×2 IMPLANT
CATH HEMA 3WAY 30CC 22FR COUDE (CATHETERS) ×2 IMPLANT
DRAPE CAMERA CLOSED 9X96 (DRAPES) ×2 IMPLANT
ELECT BUTTON HF 24-28F 2 30DE (ELECTRODE) IMPLANT
ELECT HF RESECT BIPO 24F 45 ND (CUTTING LOOP) IMPLANT
ELECT LOOP MED HF 24F 12D CBL (CLIP) ×2 IMPLANT
ELECT RESECT VAPORIZE 12D CBL (ELECTRODE) IMPLANT
GLOVE BIOGEL M STRL SZ7.5 (GLOVE) ×6 IMPLANT
GOWN STRL REUS W/TWL XL LVL3 (GOWN DISPOSABLE) ×2 IMPLANT
HOLDER FOLEY CATH W/STRAP (MISCELLANEOUS) ×2 IMPLANT
IV NS IRRIG 3000ML ARTHROMATIC (IV SOLUTION) IMPLANT
KIT ASPIRATION TUBING (SET/KITS/TRAYS/PACK) ×2 IMPLANT
MANIFOLD NEPTUNE II (INSTRUMENTS) ×2 IMPLANT
NS IRRIG 1000ML POUR BTL (IV SOLUTION) ×2 IMPLANT
PACK CYSTO (CUSTOM PROCEDURE TRAY) ×2 IMPLANT
SYR 30ML LL (SYRINGE) ×2 IMPLANT
SYRINGE IRR TOOMEY STRL 70CC (SYRINGE) ×2 IMPLANT
TUBING CONNECTING 10 (TUBING) ×2 IMPLANT

## 2013-08-08 NOTE — Transfer of Care (Signed)
Immediate Anesthesia Transfer of Care Note  Patient: Cole Martinez  Procedure(s) Performed: Procedure(s): TRANSURETHRAL RESECTION OF THE PROSTATE WITH GYRUS INSTRUMENTS (N/A)  Patient Location: PACU  Anesthesia Type:General  Level of Consciousness: awake, alert , oriented and patient cooperative  Airway & Oxygen Therapy: Patient Spontanous Breathing and Patient connected to face mask oxygen  Post-op Assessment: Report given to PACU RN and Post -op Vital signs reviewed and stable  Post vital signs: stable  Complications: No apparent anesthesia complications

## 2013-08-08 NOTE — Discharge Instructions (Signed)
Post transurethral resection of the prostate (TURP) instructions  Your recent prostate surgery requires very special post hospital care. Despite the fact that no skin incisions were used the area around the prostate incision is quite raw and is covered with a scab to promote healing and prevent bleeding. Certain cautions are needed to assure that the scab is not disturbed of the next 2-3 weeks while the healing proceeds.  Because the raw surface in your prostate and the irritating effects of urine you may expect frequency of urination and/or urgency (a stronger desire to urinate) and perhaps even getting up at night more often. This will usually resolve or improve slowly over the healing period. You may see some blood in your urine over the first 6 weeks. Do not be alarmed, even if the urine was clear for a while. Get off your feet and drink lots of fluids until clearing occurs. If you start to pass clots or don't improve call us.  Diet:  You may return to your normal diet immediately. Because of the raw surface of your bladder, alcohol, spicy foods, foods high in acid and drinks with caffeine may cause irritation or frequency and should be used in moderation. To keep your urine flowing freely and avoid constipation, drink plenty of fluids during the day (8-10 glasses). Tip: Avoid cranberry juice because it is very acidic.  Activity:  Your physical activity doesn't need to be restricted. However, if you are very active, you may see some blood in the urine. We suggest that you reduce your activity under the circumstances until the bleeding has stopped.  Bowels:  It is important to keep your bowels regular during the postoperative period. Straining with bowel movements can cause bleeding. A bowel movement every other day is reasonable. Use a mild laxative if needed, such as milk of magnesia 2-3 tablespoons, or 2 Dulcolax tablets. Call if you continue to have problems. If you had been taking narcotics  for pain, before, during or after your surgery, you may be constipated. Take a laxative if necessary.  Medication:  You should resume your pre-surgery medications unless told not to. In addition you may be given an antibiotic to prevent or treat infection. Antibiotics are not always necessary. All medication should be taken as prescribed until the bottles are finished unless you are having an unusual reaction to one of the drugs.     Problems you should report to [place]:  a. Fever greater than 101F. b. Heavy bleeding, or clots (see notes above about blood in urine). c. Inability to urinate. d. Drug reactions (hives, rash, nausea, vomiting, diarrhea). e. Severe burning or pain with urination that is not improving.
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
## 2013-08-08 NOTE — Anesthesia Postprocedure Evaluation (Signed)
Anesthesia Post Note  Patient: Cole Martinez  Procedure(s) Performed: Procedure(s) (LRB): TRANSURETHRAL RESECTION OF THE PROSTATE WITH GYRUS INSTRUMENTS (N/A)  Anesthesia type: General  Patient location: PACU  Post pain: Pain level controlled  Post assessment: Post-op Vital signs reviewed  Last Vitals: BP 144/80  Pulse 80  Temp(Src) 36.4 C (Oral)  Resp 16  Ht 5' 8"  (1.727 m)  Wt 199 lb 4 oz (90.379 kg)  BMI 30.30 kg/m2  SpO2 96%  Post vital signs: Reviewed  Level of consciousness: sedated  Complications: No apparent anesthesia complications

## 2013-08-08 NOTE — Anesthesia Preprocedure Evaluation (Signed)
Anesthesia Evaluation  Patient identified by MRN, date of birth, ID band Patient awake    Reviewed: Allergy & Precautions, H&P , NPO status , Patient's Chart, lab work & pertinent test results  Airway Mallampati: II TM Distance: >3 FB Neck ROM: Full    Dental  (+) Teeth Intact, Caps   Pulmonary former smoker,  breath sounds clear to auscultation  Pulmonary exam normal       Cardiovascular + CAD and + Cardiac Stents Rhythm:Regular     Neuro/Psych negative neurological ROS  negative psych ROS   GI/Hepatic negative GI ROS, Neg liver ROS, Crohns disease   Endo/Other  negative endocrine ROS  Renal/GU negative Renal ROS     Musculoskeletal negative musculoskeletal ROS (+)   Abdominal   Peds  Hematology negative hematology ROS (+) Blood dyscrasia, , Hx of CLL, asymptomatic   Anesthesia Other Findings Multiple caps  Reproductive/Obstetrics                           Anesthesia Physical  Anesthesia Plan  ASA: III  Anesthesia Plan: General   Post-op Pain Management:    Induction: Intravenous  Airway Management Planned: LMA  Additional Equipment:   Intra-op Plan:   Post-operative Plan: Extubation in OR  Informed Consent: I have reviewed the patients History and Physical, chart, labs and discussed the procedure including the risks, benefits and alternatives for the proposed anesthesia with the patient or authorized representative who has indicated his/her understanding and acceptance.   Dental advisory given  Plan Discussed with: CRNA  Anesthesia Plan Comments:         Anesthesia Quick Evaluation
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
## 2013-08-08 NOTE — Progress Notes (Signed)
Urology Progress Note  Day of Surgery   Subjective: Post TURP. Urine clearing. On CBI/traction.     No acute urologic events overnight. Ambulation:   positive Flatus:    positive Bowel movement  negative  Pain: some relief  Objective:  Blood pressure 144/80, pulse 80, temperature 97.6 F (36.4 C), temperature source Oral, resp. rate 16, height 5' 8"  (1.727 m), weight 90.379 kg (199 lb 4 oz), SpO2 96.00%.  Physical Exam:  General:  No acute distress, awake Extremities: extremities normal, atraumatic, no cyanosis or edema Genitourinary:  Normal s-p area Foley:  Pink urine       No results found for this basename: HGB, WBC, PLT,  in the last 72 hours  No results found for this basename: NA, K, CL, CO2, BUN, CREATININE, CALCIUM, MAGNESIUM, GFRNONAA, GFRAA,  in the last 72 hours   No results found for this basename: PT, INR, APTT,  in the last 72 hours   No components found with this basename: ABG,   Assessment/Plan:  Continue any current medications. Plan for discharge Sunday

## 2013-08-09 NOTE — Progress Notes (Signed)
Urology Progress Note  1 Day Post-Op  POD #1. Post TURP Subjective:     No acute urologic events overnight. Ambulation:   positive Flatus:    positive Bowel movement  negative  Pain: complete resolution  Objective:  Blood pressure 137/86, pulse 92, temperature 97.6 F (36.4 C), temperature source Oral, resp. rate 16, height 5' 8"  (1.727 m), weight 90.379 kg (199 lb 4 oz), SpO2 96.00%.  Physical Exam:  General:  No acute distress, awake Extremities: extremities normal, atraumatic, no cyanosis or edema Genitourinary:  wnl Foley: in place    I/O last 3 completed shifts: In: 26787.5 [I.V.:2427.5; Other:24360] Out: 82993 [Urine:28110; Blood:50]  No results found for this basename: HGB, WBC, PLT,  in the last 72 hours  No results found for this basename: NA, K, CL, CO2, BUN, CREATININE, CALCIUM, MAGNESIUM, GFRNONAA, GFRAA,  in the last 72 hours   No results found for this basename: PT, INR, APTT,  in the last 72 hours   No components found with this basename: ABG,   Assessment/Plan:  Catheter not removed. D/c CBI.

## 2013-08-10 LAB — BASIC METABOLIC PANEL
BUN: 24 mg/dL — ABNORMAL HIGH (ref 6–23)
CO2: 24 mEq/L (ref 19–32)
Calcium: 8.4 mg/dL (ref 8.4–10.5)
Chloride: 102 mEq/L (ref 96–112)
Creatinine, Ser: 1.16 mg/dL (ref 0.50–1.35)
GFR calc Af Amer: 72 mL/min — ABNORMAL LOW (ref >90)
GFR calc non Af Amer: 62 mL/min — ABNORMAL LOW (ref >90)
Glucose, Bld: 100 mg/dL — ABNORMAL HIGH (ref 70–99)
Potassium: 4.2 mEq/L (ref 3.7–5.3)
Sodium: 140 mEq/L (ref 137–147)

## 2013-08-10 LAB — CBC
HCT: 38.3 % — ABNORMAL LOW (ref 39.0–52.0)
Hemoglobin: 12.6 g/dL — ABNORMAL LOW (ref 13.0–17.0)
MCH: 29.7 pg (ref 26.0–34.0)
MCHC: 32.9 g/dL (ref 30.0–36.0)
MCV: 90.3 fL (ref 78.0–100.0)
Platelets: 193 10*3/uL (ref 150–400)
RBC: 4.24 MIL/uL (ref 4.22–5.81)
RDW: 14 % (ref 11.5–15.5)
WBC: 10.8 10*3/uL — ABNORMAL HIGH (ref 4.0–10.5)

## 2013-08-10 NOTE — Discharge Summary (Signed)
Date of admission: 08/08/2013  Date of discharge: 08/10/2013  Admission diagnosis: bladder outlet obstruction  Discharge diagnosis: s/p TURP  Secondary diagnoses:  Patient Active Problem List   Diagnosis Date Noted  . Benign prostatic hypertrophy 08/08/2013  . Essential and other specified forms of tremor 11/25/2012    History and Physical: For full details, please see admission history and physical. Briefly, Cole Martinez is a 68 y.o. year old patient with difficulty voiding secondary to bladder outlet obstruction.   Hospital Course: Patient tolerated the procedure well.  He was then transferred to the floor after an uneventful PACU stay.  His hospital course was uncomplicated.  On POD#2  he had met discharge criteria: was eating a regular diet, was up and ambulating independently,  pain was well controlled, was voiding without a catheter, and was ready to for discharge.   Laboratory values:   Recent Labs  08/10/13 0408  WBC 10.8*  HGB 12.6*  HCT 38.3*    Recent Labs  08/10/13 0408  NA 140  K 4.2  CL 102  CO2 24  GLUCOSE 100*  BUN 24*  CREATININE 1.16  CALCIUM 8.4   No results found for this basename: LABPT, INR,  in the last 72 hours No results found for this basename: LABURIN,  in the last 72 hours No results found for this or any previous visit.  Disposition: Home  Discharge instruction: The patient was instructed to be ambulatory but told to refrain from heavy lifting, strenuous activity, or driving.  Discharge medications:    Medication List    STOP taking these medications       alfuzosin 10 MG 24 hr tablet  Commonly known as:  UROXATRAL      TAKE these medications       amphetamine-dextroamphetamine 5 MG tablet  Commonly known as:  ADDERALL  Take 5 mg by mouth 2 (two) times daily.     aspirin EC 81 MG tablet  Take 81 mg by mouth daily.     atorvastatin 20 MG tablet  Commonly known as:  LIPITOR  Take 20 mg by mouth every morning.     ciprofloxacin 500 MG tablet  Commonly known as:  CIPRO  Take 1 tablet (500 mg total) by mouth 2 (two) times daily.     Cyanocobalamin 2500 MCG Chew  Chew by mouth.     meloxicam 15 MG tablet  Commonly known as:  MOBIC  Take 15 mg by mouth daily.     omeprazole 20 MG capsule  Commonly known as:  PRILOSEC  Take 20 mg by mouth daily.     oxybutynin 5 MG tablet  Commonly known as:  DITROPAN  - Use 3x/day for bladder spasms.  - Caution: dry eyes, dry mouth, constipation,  confusion     phenazopyridine 200 MG tablet  Commonly known as:  PYRIDIUM  - Take 1 tablet (200 mg total) by mouth 3 (three) times daily as needed for pain. For urinary burning  - Note: will stain clothing     traMADol-acetaminophen 37.5-325 MG per tablet  Commonly known as:  ULTRACET  Take 1 tablet by mouth every 6 (six) hours as needed.        Followup:      Follow-up Information   Follow up with Carolan Clines I, MD. (per appointment)    Specialty:  Urology   Contact information:   [place] [address] [phone]

## 2013-08-10 NOTE — Progress Notes (Signed)
Urology Inpatient Progress Report  Intv/Subj: No acute events overnight. Foley removed this AM Voided multiple times since foley was removed - small amts, slow stream.  Urine bloody.   Past Medical History  Diagnosis Date  . Hyperlipidemia   . S/P coronary artery stent placement OCT 1999 OF LAD  . B12 deficiency   . Crohn's disease of ileum SINCE 1988  . H/O adenomatous polyp of colon   . Peyronie disease   . BPH (benign prostatic hypertrophy)   . Tremor, hereditary, benign MILD RIGHT HAND  . Elevated PSA   . Nocturia   . Coronary artery disease CARDIOLOGIST- DR Daneen Schick    S/P STENTING LAD 1999  . ED (erectile dysfunction)   . ADHD (attention deficit hyperactivity disorder)   . OA (osteoarthritis)   . Chronic lymphocytic leukemia (CLL), T-cell DX 1996--  ONCOLOGIST-  DR [facility]    PT IS ASYMPTOMATIC--- LAST CBC W/ DIFF 06-24-2012 STABLE  . Essential and other specified forms of tremor 11/25/2012  . Peripheral neuropathy     hx of, none current as of 08-04-13   Current Facility-Administered Medications  Medication Dose Route Frequency Provider Last Rate Last Dose  . 0.45 % sodium chloride infusion   Intravenous Continuous Ailene Rud, MD 50 mL/hr at 08/08/13 1732 50 mL/hr at 08/08/13 1732  . acetaminophen (TYLENOL) tablet 650 mg  650 mg Oral Q4H PRN Ailene Rud, MD      . amphetamine-dextroamphetamine (ADDERALL) tablet 5 mg  5 mg Oral BID WC Ailene Rud, MD   5 mg at 08/10/13 0935  . atorvastatin (LIPITOR) tablet 20 mg  20 mg Oral q morning - 10a Ailene Rud, MD   20 mg at 08/10/13 0934  . bisacodyl (DULCOLAX) suppository 10 mg  10 mg Rectal Daily PRN Ailene Rud, MD      . ciprofloxacin (CIPRO) tablet 500 mg  500 mg Oral BID Ailene Rud, MD   500 mg at 08/10/13 0934  . diphenhydrAMINE (BENADRYL) injection 12.5 mg  12.5 mg Intravenous Q6H PRN Ailene Rud, MD       Or  . diphenhydrAMINE (BENADRYL) 12.5 MG/5ML  elixir 12.5 mg  12.5 mg Oral Q6H PRN Ailene Rud, MD      . HYDROmorphone (DILAUDID) injection 0.5-1 mg  0.5-1 mg Intravenous Q2H PRN Ailene Rud, MD   1 mg at 08/10/13 0538  . neomycin-bacitracin-polymyxin (NEOSPORIN) ointment 1 application  1 application Topical TID PRN Ailene Rud, MD      . ondansetron [facility]) injection 4 mg  4 mg Intravenous Q4H PRN Ailene Rud, MD      . oxybutynin (DITROPAN) tablet 5 mg  5 mg Oral Q8H PRN Ailene Rud, MD      . oxyCODONE-acetaminophen (PERCOCET/ROXICET) 5-325 MG per tablet 1-2 tablet  1-2 tablet Oral Q4H PRN Ailene Rud, MD      . pantoprazole (PROTONIX) EC tablet 40 mg  40 mg Oral Daily Ailene Rud, MD   40 mg at 08/10/13 0934  . senna-docusate (Senokot-S) tablet 2 tablet  2 tablet Oral QHS Ailene Rud, MD   2 tablet at 08/08/13 2122     Objective: Vital: Filed Vitals:   08/09/13 0543 08/09/13 1457 08/09/13 2055 08/10/13 0507  BP: 137/86 150/85 150/75 151/90  Pulse: 92 86 81 80  Temp: 97.6 F (36.4 C) 97.3 F (36.3 C) 98.2 F (36.8 C) 97.6 F (36.4 C)  TempSrc: Oral  Oral Oral Oral  Resp: 16 20 18 18   Height:      Weight:      SpO2: 96% 100% 98% 97%   I/Os: I/O last 3 completed shifts: In: 7827.5 [P.O.:840; I.V.:927.5; Other:6060] Out: 12400 [Urine:12400]  Physical Exam:  General: Patient is in no apparent distress Lungs: Normal respiratory effort, chest expands symmetrically. GI: The abdomen is soft and nontender without mass. Ext: lower extremities symmetric  Lab Results:  Recent Labs  08/10/13 0408  WBC 10.8*  HGB 12.6*  HCT 38.3*    Recent Labs  08/10/13 0408  NA 140  K 4.2  CL 102  CO2 24  GLUCOSE 100*  BUN 24*  CREATININE 1.16  CALCIUM 8.4   No results found for this basename: LABPT, INR,  in the last 72 hours No results found for this basename: LABURIN,  in the last 72 hours No results found for this or any previous  visit.  Studies/Results:   Assessment: 2 Days Post-Op, s/p TURP, doing well  Plan: Check PVR, d/c home if < 200cc Encouraged fluids/timed voiding and double voiding x 24 hrs.  Ardis Hughs 08/10/2013, 11:26 AM

## 2013-08-11 ENCOUNTER — Encounter (HOSPITAL_COMMUNITY): Payer: Self-pay | Admitting: Urology

## 2013-09-13 ENCOUNTER — Encounter (HOSPITAL_COMMUNITY): Payer: Self-pay | Admitting: Emergency Medicine

## 2013-09-13 ENCOUNTER — Emergency Department (HOSPITAL_COMMUNITY)
Admission: EM | Admit: 2013-09-13 | Discharge: 2013-09-13 | Disposition: A | Discharge status: Home / Self Care | Payer: Medicare Other | Attending: Emergency Medicine | Admitting: Emergency Medicine

## 2013-09-13 DIAGNOSIS — F909 Attention-deficit hyperactivity disorder, unspecified type: Secondary | ICD-10-CM | POA: Diagnosis not present

## 2013-09-13 DIAGNOSIS — Z862 Personal history of diseases of the blood and blood-forming organs and certain disorders involving the immune mechanism: Secondary | ICD-10-CM | POA: Insufficient documentation

## 2013-09-13 DIAGNOSIS — M1990 Unspecified osteoarthritis, unspecified site: Secondary | ICD-10-CM | POA: Diagnosis not present

## 2013-09-13 DIAGNOSIS — Z7982 Long term (current) use of aspirin: Secondary | ICD-10-CM | POA: Insufficient documentation

## 2013-09-13 DIAGNOSIS — Z791 Long term (current) use of non-steroidal anti-inflammatories (NSAID): Secondary | ICD-10-CM | POA: Diagnosis not present

## 2013-09-13 DIAGNOSIS — Z8719 Personal history of other diseases of the digestive system: Secondary | ICD-10-CM | POA: Insufficient documentation

## 2013-09-13 DIAGNOSIS — Z860100 Personal history of colon polyps, unspecified: Secondary | ICD-10-CM | POA: Insufficient documentation

## 2013-09-13 DIAGNOSIS — Z87891 Personal history of nicotine dependence: Secondary | ICD-10-CM | POA: Diagnosis not present

## 2013-09-13 DIAGNOSIS — E538 Deficiency of other specified B group vitamins: Secondary | ICD-10-CM | POA: Insufficient documentation

## 2013-09-13 DIAGNOSIS — Z79899 Other long term (current) drug therapy: Secondary | ICD-10-CM | POA: Diagnosis not present

## 2013-09-13 DIAGNOSIS — Z9861 Coronary angioplasty status: Secondary | ICD-10-CM | POA: Diagnosis not present

## 2013-09-13 DIAGNOSIS — I2510 Atherosclerotic heart disease of native coronary artery without angina pectoris: Secondary | ICD-10-CM | POA: Diagnosis not present

## 2013-09-13 DIAGNOSIS — N390 Urinary tract infection, site not specified: Secondary | ICD-10-CM | POA: Insufficient documentation

## 2013-09-13 DIAGNOSIS — R339 Retention of urine, unspecified: Secondary | ICD-10-CM | POA: Diagnosis not present

## 2013-09-13 DIAGNOSIS — E785 Hyperlipidemia, unspecified: Secondary | ICD-10-CM | POA: Diagnosis not present

## 2013-09-13 DIAGNOSIS — Z8669 Personal history of other diseases of the nervous system and sense organs: Secondary | ICD-10-CM | POA: Insufficient documentation

## 2013-09-13 LAB — URINALYSIS, ROUTINE W REFLEX MICROSCOPIC
Bilirubin Urine: NEGATIVE
Glucose, UA: NEGATIVE mg/dL
Ketones, ur: NEGATIVE mg/dL
Nitrite: NEGATIVE
Protein, ur: NEGATIVE mg/dL
Specific Gravity, Urine: 1.013 (ref 1.005–1.030)
Urobilinogen, UA: 0.2 mg/dL (ref 0.0–1.0)
pH: 5.5 (ref 5.0–8.0)

## 2013-09-13 LAB — URINE MICROSCOPIC-ADD ON

## 2013-09-13 MED ORDER — CIPROFLOXACIN HCL 500 MG PO TABS: 500.0000 mg | ORAL_TABLET | Freq: Two times a day (BID) | ORAL | Status: DC

## 2013-09-13 NOTE — ED Notes (Signed)
Pt presents with urinary retention, last emptied bladder 0100. Pt states he woke up at 0300 with urge to urinate, unable to urinate. Pressure to lower abdomen noted. Pt had TURP 08/08/13

## 2013-09-13 NOTE — ED Notes (Signed)
Pt and family educated on leg bag placement and drainage. Pt provided urinal and saline to clean meatus. Pt educated on adequate urine output

## 2013-09-13 NOTE — ED Provider Notes (Signed)
CSN: [id number]     Arrival date & time 09/13/13  0428 History   First MD Initiated Contact with Patient 09/13/13 0518     Chief Complaint  Patient presents with  . Urinary Retention     (Consider location/radiation/quality/duration/timing/severity/associated sxs/prior Treatment) HPI Comments: The patient is a 68 year old male with a history of a recent transurethral resection of the prostate procedure which occurred approximately 5 weeks ago. His hematuria cleared 2 weeks ago and he has had no difficulty urinating until several days ago when he developed a gradual weakening of his urinary stream. This became severe last night and he has been unable to urinate since yesterday. He has a gradual discomfort in his suprapubic area which is now severe. He is very uncomfortable, nothing makes this better or worse, no associated fevers chills nausea vomiting or trauma to the urethra since surgery. He denies any bowel abnormalities either.  The history is provided by the patient.    Past Medical History  Diagnosis Date  . Hyperlipidemia   . S/P coronary artery stent placement OCT 1999 OF LAD  . B12 deficiency   . Crohn's disease of ileum SINCE 1988  . H/O adenomatous polyp of colon   . Peyronie disease   . BPH (benign prostatic hypertrophy)   . Tremor, hereditary, benign MILD RIGHT HAND  . Elevated PSA   . Nocturia   . Coronary artery disease CARDIOLOGIST- DR Daneen Schick    S/P STENTING LAD 1999  . ED (erectile dysfunction)   . ADHD (attention deficit hyperactivity disorder)   . OA (osteoarthritis)   . Chronic lymphocytic leukemia (CLL), T-cell DX 1996--  ONCOLOGIST-  DR [facility]    PT IS ASYMPTOMATIC--- LAST CBC W/ DIFF 06-24-2012 STABLE  . Essential and other specified forms of tremor 11/25/2012  . Peripheral neuropathy     hx of, none current as of 08-04-13   Past Surgical History  Procedure Laterality Date  . Coronary angioplasty with stent placement  OCT 1999    STENT OF LAD  .  Laparoscopic inguinal hernia repair Bilateral 01-10-2004    W/ MESH  . Removal left neck lymph node  1996  . Prostate surgery  2002    tuna  . Tonsillectomy  CHILD  . Cardiovascular stress test  11-01-2010 DR Daneen Schick    NORMAL PERFUSION STUDY/ EF 64%/ NO ISCHEMIA  . Prostate biopsy N/A 07/12/2012    Procedure: PROSTATE BIOPSY AND ULTRASOUND;  Surgeon: Ailene Rud, MD;  Location: [facility];  Service: Urology;  Laterality: N/A;  . Neck benign removed from neck  yrs ago  . Colonoscopy with propofol N/A 09/24/2012    Procedure: COLONOSCOPY WITH PROPOFOL;  Surgeon: [name], MD;  Location: [facility];  Service: Endoscopy;  Laterality: N/A;  . Transurethral resection of prostate N/A 08/08/2013    Procedure: TRANSURETHRAL RESECTION OF THE PROSTATE WITH GYRUS INSTRUMENTS;  Surgeon: Ailene Rud, MD;  Location: [facility];  Service: Urology;  Laterality: N/A;  . Transurethral resection of prostate     Family History  Problem Relation Age of Onset  . Obesity Brother   . Diabetes Brother   . Parkinsonism Brother    History  Substance Use Topics  . Smoking status: Former Smoker -- 1.00 packs/day for 10 years    Types: Cigarettes    Quit date: 07/09/1984  . Smokeless tobacco: Never Used  . Alcohol Use: 1.0 oz/week    2 drink(s) per week  Comment: daily 1 per day    Review of Systems  All other systems reviewed and are negative.     Allergies  Review of patient's allergies indicates no known allergies.  Home Medications   Prior to Admission medications   Medication Sig Start Date End Date Taking? Authorizing Provider  amphetamine-dextroamphetamine (ADDERALL) 5 MG tablet Take 5 mg by mouth 2 (two) times daily.   Yes Historical Provider, MD  aspirin EC 81 MG tablet Take 81 mg by mouth daily.   Yes Historical Provider, MD  atorvastatin (LIPITOR) 20 MG tablet Take 20 mg by mouth every morning.    Yes Historical Provider, MD  Cyanocobalamin  2500 MCG CHEW Chew by mouth.   Yes Historical Provider, MD  meloxicam (MOBIC) 15 MG tablet Take 15 mg by mouth daily.   Yes Historical Provider, MD  omeprazole (PRILOSEC) 20 MG capsule Take 20 mg by mouth daily.   Yes Historical Provider, MD  oxybutynin (DITROPAN) 5 MG tablet Take 5 mg by mouth every 8 (eight) hours as needed for bladder spasms.   Yes Historical Provider, MD  phenazopyridine (PYRIDIUM) 200 MG tablet Take 1 tablet (200 mg total) by mouth 3 (three) times daily as needed for pain. For urinary burning Note: will stain clothing 08/08/13  Yes Ailene Rud, MD  traMADol-acetaminophen (ULTRACET) 37.5-325 MG per tablet Take 1 tablet by mouth every 6 (six) hours as needed for severe pain.   Yes Historical Provider, MD  ciprofloxacin (CIPRO) 500 MG tablet Take 1 tablet (500 mg total) by mouth every 12 (twelve) hours. 09/13/13   Johnna Acosta, MD   BP 127/73  Pulse 87  Temp(Src) 97.6 F (36.4 C) (Oral)  Resp 20  Ht 5' 8"  (1.727 m)  Wt 196 lb (88.905 kg)  BMI 29.81 kg/m2  SpO2 99% Physical Exam  Nursing note and vitals reviewed. Constitutional: He appears well-developed and well-nourished. No distress.  HENT:  Head: Normocephalic and atraumatic.  Eyes: Conjunctivae are normal. Right eye exhibits no discharge. Left eye exhibits no discharge. No scleral icterus.  Cardiovascular: Exam reveals no gallop.   Pulmonary/Chest: Effort normal.  Abdominal: Soft. Bowel sounds are normal. He exhibits no distension and no mass. There is tenderness (mild suprapubic tenderness and fullness).  Genitourinary:  Normal-appearing penis scrotum and testicles  Neurological: He is alert. Coordination normal.  Skin: Skin is warm and dry. No rash noted. No erythema.  Psychiatric: He has a normal mood and affect. His behavior is normal.    ED Course  Procedures (including critical care time) Labs Review Labs Reviewed  URINALYSIS, ROUTINE W REFLEX MICROSCOPIC - Abnormal; Notable for the  following:    APPearance CLOUDY (*)    Hgb urine dipstick SMALL (*)    Leukocytes, UA LARGE (*)    All other components within normal limits  URINE MICROSCOPIC-ADD ON - Abnormal; Notable for the following:    Bacteria, UA FEW (*)    All other components within normal limits  URINE CULTURE    Imaging Review No results found.    MDM   Final diagnoses:  Urinary retention  UTI (lower urinary tract infection)    The patient appears mildly discomfort comfortable, vital signs are normal, urinalysis, culture, Foley placement, patient is stable appearing and should be stable for discharge  Foley catheter placed, patient immediately felt improved, urinalysis confirms infection but vital signs stable and patient amenable to discharge, has followup with urology on Wednesday. All questions answered, patient expresses indications for return  without difficulty.  Meds given in ED:  Medications - No data to display  New Prescriptions   CIPROFLOXACIN (CIPRO) 500 MG TABLET    Take 1 tablet (500 mg total) by mouth every 12 (twelve) hours.      Johnna Acosta, MD 09/13/13 [phone]

## 2013-09-14 LAB — URINE CULTURE
Colony Count: NO GROWTH
Culture: NO GROWTH

## 2013-09-15 DIAGNOSIS — N139 Obstructive and reflux uropathy, unspecified: Secondary | ICD-10-CM | POA: Diagnosis not present

## 2013-09-15 DIAGNOSIS — N138 Other obstructive and reflux uropathy: Secondary | ICD-10-CM | POA: Diagnosis not present

## 2013-09-15 DIAGNOSIS — N401 Enlarged prostate with lower urinary tract symptoms: Secondary | ICD-10-CM | POA: Diagnosis not present

## 2013-09-16 DIAGNOSIS — R339 Retention of urine, unspecified: Secondary | ICD-10-CM | POA: Diagnosis not present

## 2013-10-20 ENCOUNTER — Ambulatory Visit (INDEPENDENT_AMBULATORY_CARE_PROVIDER_SITE_OTHER): Payer: Medicare Other | Admitting: *Deleted

## 2013-10-20 DIAGNOSIS — G252 Other specified forms of tremor: Secondary | ICD-10-CM

## 2013-10-20 DIAGNOSIS — E7800 Pure hypercholesterolemia, unspecified: Secondary | ICD-10-CM

## 2013-10-20 DIAGNOSIS — N400 Enlarged prostate without lower urinary tract symptoms: Secondary | ICD-10-CM | POA: Diagnosis not present

## 2013-10-20 DIAGNOSIS — G250 Essential tremor: Secondary | ICD-10-CM

## 2013-10-20 LAB — LIPID PANEL
Cholesterol: 134 mg/dL (ref 0–200)
HDL: 45.3 mg/dL (ref >39.00)
LDL Cholesterol: 70 mg/dL (ref 0–99)
NonHDL: 88.7
Total CHOL/HDL Ratio: 3
Triglycerides: 93 mg/dL (ref 0.0–149.0)
VLDL: 18.6 mg/dL (ref 0.0–40.0)

## 2013-10-20 LAB — ALT: ALT: 16 U/L (ref 0–53)

## 2013-10-24 ENCOUNTER — Telehealth: Payer: Self-pay | Admitting: Oncology

## 2013-10-24 NOTE — Telephone Encounter (Signed)
Pt cld to r/s due to going out of town...Marland KitchenMarland KitchenKJ

## 2013-10-28 ENCOUNTER — Other Ambulatory Visit: Payer: Self-pay | Admitting: General Surgery

## 2013-10-28 DIAGNOSIS — E7800 Pure hypercholesterolemia, unspecified: Secondary | ICD-10-CM

## 2013-10-29 DIAGNOSIS — R310 Gross hematuria: Secondary | ICD-10-CM | POA: Diagnosis not present

## 2013-10-29 DIAGNOSIS — N390 Urinary tract infection, site not specified: Secondary | ICD-10-CM | POA: Diagnosis not present

## 2013-11-03 DIAGNOSIS — R310 Gross hematuria: Secondary | ICD-10-CM | POA: Diagnosis not present

## 2013-11-03 DIAGNOSIS — N400 Enlarged prostate without lower urinary tract symptoms: Secondary | ICD-10-CM | POA: Diagnosis not present

## 2013-11-13 ENCOUNTER — Other Ambulatory Visit (HOSPITAL_BASED_OUTPATIENT_CLINIC_OR_DEPARTMENT_OTHER): Payer: Medicare Other

## 2013-11-13 ENCOUNTER — Ambulatory Visit (HOSPITAL_BASED_OUTPATIENT_CLINIC_OR_DEPARTMENT_OTHER): Payer: Medicare Other | Admitting: Oncology

## 2013-11-13 ENCOUNTER — Telehealth: Payer: Self-pay | Admitting: Oncology

## 2013-11-13 VITALS — BP 146/84 | HR 82 | Temp 98.4°F | Resp 18 | Ht 68.0 in | Wt 195.2 lb

## 2013-11-13 DIAGNOSIS — Z23 Encounter for immunization: Secondary | ICD-10-CM

## 2013-11-13 DIAGNOSIS — C9110 Chronic lymphocytic leukemia of B-cell type not having achieved remission: Secondary | ICD-10-CM

## 2013-11-13 LAB — CBC WITH DIFFERENTIAL/PLATELET
BASO%: 0.8 % (ref 0.0–2.0)
Basophils Absolute: 0.1 10*3/uL (ref 0.0–0.1)
EOS%: 3.4 % (ref 0.0–7.0)
Eosinophils Absolute: 0.3 10*3/uL (ref 0.0–0.5)
HCT: 47.6 % (ref 38.4–49.9)
HGB: 15.5 g/dL (ref 13.0–17.1)
LYMPH%: 20.7 % (ref 14.0–49.0)
MCH: 29.6 pg (ref 27.2–33.4)
MCHC: 32.6 g/dL (ref 32.0–36.0)
MCV: 90.8 fL (ref 79.3–98.0)
MONO#: 0.7 10*3/uL (ref 0.1–0.9)
MONO%: 8.3 % (ref 0.0–14.0)
NEUT#: 5.7 10*3/uL (ref 1.5–6.5)
NEUT%: 66.8 % (ref 39.0–75.0)
Platelets: 239 10*3/uL (ref 140–400)
RBC: 5.24 10*6/uL (ref 4.20–5.82)
RDW: 13.5 % (ref 11.0–14.6)
WBC: 8.5 10*3/uL (ref 4.0–10.3)
lymph#: 1.8 10*3/uL (ref 0.9–3.3)

## 2013-11-13 MED ORDER — PNEUMOCOCCAL 13-VAL CONJ VACC IM SUSP
0.5000 mL | Freq: Once | INTRAMUSCULAR | Status: AC
  Administered 2013-11-13: 0.5 mL via INTRAMUSCULAR
  Filled 2013-11-13: qty 0.5

## 2013-11-13 MED ORDER — INFLUENZA VAC SPLIT QUAD 0.5 ML IM SUSY
0.5000 mL | PREFILLED_SYRINGE | Freq: Once | INTRAMUSCULAR | Status: AC
  Administered 2013-11-13: 0.5 mL via INTRAMUSCULAR
  Filled 2013-11-13: qty 0.5

## 2013-11-13 NOTE — Telephone Encounter (Signed)
Pt confirmed labs/ov per 09/10 POF, gave pt AVS....KJ

## 2013-11-13 NOTE — Progress Notes (Signed)
  [place] OFFICE PROGRESS NOTE   Diagnosis: CLL  INTERVAL HISTORY:   He returns as scheduled. No fever, night sweats, or anorexia. He underwent a TUR procedure the summer. He continues to have nocturia. He is scheduled to see Dr. Era Bumpers next week.  Objective:  Vital signs in last 24 hours:  Blood pressure 146/84, pulse 82, temperature 98.4 F (36.9 C), temperature source Oral, resp. rate 18, height 5' 8"  (1.727 m), weight 195 lb 3.2 oz (88.542 kg), SpO2 97.00%.    HEENT: Neck without mass Lymphatics: No cervical, supraclavicular, axillary, or inguinal nodes Resp: Lungs clear bilaterally Cardio: Regular rate and rhythm GI: No hepatosplenomegaly Vascular: No leg edema   Lab Results:  Lab Results  Component Value Date   WBC 8.5 11/13/2013   HGB 15.5 11/13/2013   HCT 47.6 11/13/2013   MCV 90.8 11/13/2013   PLT 239 11/13/2013   NEUTROABS 5.7 11/13/2013     Medications: I have reviewed the patient's current medications.  Assessment/Plan: 1. Chronic lymphocytic leukemia, diagnosed in 1996. He remains asymptomatic and stable from a hematologic standpoint. 2. pneumococcal vaccine given on 10/18/2010 , 13 valent pneumonia vaccine given 11/13/2013  Disposition:  He remains asymptomatic from the CLL and stable from a hematologic standpoint. He received an influenza and pneumonia vaccine today. Mr. Wadlow would like to continue followup at the [facility]. He will return for an office visit in one year.  Betsy Coder, MD  11/13/2013  8:40 AM

## 2013-11-14 ENCOUNTER — Other Ambulatory Visit: Payer: Medicare Other

## 2013-11-14 ENCOUNTER — Ambulatory Visit: Payer: Medicare Other | Admitting: Oncology

## 2013-11-19 DIAGNOSIS — E291 Testicular hypofunction: Secondary | ICD-10-CM | POA: Diagnosis not present

## 2013-11-19 DIAGNOSIS — N139 Obstructive and reflux uropathy, unspecified: Secondary | ICD-10-CM | POA: Diagnosis not present

## 2013-11-19 DIAGNOSIS — N401 Enlarged prostate with lower urinary tract symptoms: Secondary | ICD-10-CM | POA: Diagnosis not present

## 2013-11-19 DIAGNOSIS — R310 Gross hematuria: Secondary | ICD-10-CM | POA: Diagnosis not present

## 2013-11-27 ENCOUNTER — Telehealth: Payer: Self-pay

## 2013-11-27 ENCOUNTER — Other Ambulatory Visit: Payer: Self-pay

## 2013-11-27 DIAGNOSIS — C9110 Chronic lymphocytic leukemia of B-cell type not having achieved remission: Secondary | ICD-10-CM

## 2013-11-27 MED ORDER — ATORVASTATIN CALCIUM 20 MG PO TABS: 20.0000 mg | ORAL_TABLET | Freq: Every morning | ORAL | Status: DC

## 2013-11-27 NOTE — Telephone Encounter (Signed)
refill 

## 2013-12-01 ENCOUNTER — Other Ambulatory Visit: Payer: Self-pay

## 2013-12-01 DIAGNOSIS — C9110 Chronic lymphocytic leukemia of B-cell type not having achieved remission: Secondary | ICD-10-CM

## 2013-12-01 MED ORDER — ATORVASTATIN CALCIUM 20 MG PO TABS: 20.0000 mg | ORAL_TABLET | Freq: Every morning | ORAL | Status: DC

## 2013-12-03 DIAGNOSIS — E291 Testicular hypofunction: Secondary | ICD-10-CM | POA: Diagnosis not present

## 2013-12-06 ENCOUNTER — Other Ambulatory Visit: Payer: Self-pay | Admitting: Interventional Cardiology

## 2013-12-08 ENCOUNTER — Ambulatory Visit (INDEPENDENT_AMBULATORY_CARE_PROVIDER_SITE_OTHER): Payer: Medicare Other | Admitting: Interventional Cardiology

## 2013-12-08 ENCOUNTER — Encounter: Payer: Self-pay | Admitting: Interventional Cardiology

## 2013-12-08 VITALS — BP 158/100 | HR 75 | Ht 67.0 in | Wt 199.1 lb

## 2013-12-08 DIAGNOSIS — E7800 Pure hypercholesterolemia, unspecified: Secondary | ICD-10-CM | POA: Insufficient documentation

## 2013-12-08 DIAGNOSIS — I2510 Atherosclerotic heart disease of native coronary artery without angina pectoris: Secondary | ICD-10-CM | POA: Diagnosis not present

## 2013-12-08 DIAGNOSIS — IMO0001 Reserved for inherently not codable concepts without codable children: Secondary | ICD-10-CM

## 2013-12-08 DIAGNOSIS — R030 Elevated blood-pressure reading, without diagnosis of hypertension: Secondary | ICD-10-CM | POA: Diagnosis not present

## 2013-12-08 DIAGNOSIS — C8300 Small cell B-cell lymphoma, unspecified site: Secondary | ICD-10-CM

## 2013-12-08 DIAGNOSIS — E785 Hyperlipidemia, unspecified: Secondary | ICD-10-CM | POA: Diagnosis not present

## 2013-12-08 NOTE — Patient Instructions (Signed)
Your physician recommends that you continue on your current medications as directed. Please refer to the Current Medication list given to you today.  Your physician has requested that you have en exercise stress myoview. For further information please visit [email or web address]. Please follow instruction sheet, as given.  Your physician wants you to follow-up in: 1 year with Dr.Smith You will receive a reminder letter in the mail two months in advance. If you don't receive a letter, please call our office to schedule the follow-up appointment.

## 2013-12-08 NOTE — Progress Notes (Signed)
Patient ID: Cole Martinez, male   DOB: 1946-02-15, 68 y.o.   MRN: [id number]    [address], [place]  [address] Phone: [phone] Fax:  [phone]  Date:  12/08/2013   ID:  Cole Martinez, DOB January 10, 1946, MRN [id number]  PCP:  [name], MD   ASSESSMENT:  1. Exertional dyspnea 2. History of coronary artery disease with LAD stent 1999 3. Hyperlipidemia 4. Elevated blood pressure  PLAN:  1. decrease salt in diet 2. Exercise nuclear stress test. Prior history of false positive electrocardiographic stress test 3. We discussed the probable need for antihypertensive therapy. He is against the recommendation as he feels this may impair erectile function which is are you having difficulty with 4. Clinical followup in one year a likely early are pending the results of the stress test and consideration of blood pressure therapy.   SUBJECTIVE: Cole Martinez is a 68 y.o. male was doing relatively well but over the last 2 months as noted exertional dyspnea that is new. He walks 3 miles on a regular basis. Is not having dyspnea at rest. He denies orthopnea. No palpitations or syncope. He denies chest discomfort.  Multiple other medical issues have him concerned. He recently had a TUR. He is having erectile dysfunction. His testosterone levels are low and is receiving replacement therapy.   Wt Readings from Last 3 Encounters:  12/08/13 199 lb 1.9 oz (90.32 kg)  11/13/13 195 lb 3.2 oz (88.542 kg)  09/13/13 196 lb (88.905 kg)     Past Medical History  Diagnosis Date  . Hyperlipidemia   . S/P coronary artery stent placement OCT 1999 OF LAD  . B12 deficiency   . Crohn's disease of ileum SINCE 1988  . H/O adenomatous polyp of colon   . Peyronie disease   . BPH (benign prostatic hypertrophy)   . Tremor, hereditary, benign MILD RIGHT HAND  . Elevated PSA   . Nocturia   . Coronary artery disease CARDIOLOGIST- DR Daneen Schick    S/P STENTING LAD 1999  . ED  (erectile dysfunction)   . ADHD (attention deficit hyperactivity disorder)   . OA (osteoarthritis)   . Chronic lymphocytic leukemia (CLL), T-cell DX 1996--  ONCOLOGIST-  DR [facility]    PT IS ASYMPTOMATIC--- LAST CBC W/ DIFF 06-24-2012 STABLE  . Essential and other specified forms of tremor 11/25/2012  . Peripheral neuropathy     hx of, none current as of 08-04-13    Current Outpatient Prescriptions  Medication Sig Dispense Refill  . amphetamine-dextroamphetamine (ADDERALL) 5 MG tablet Take 5 mg by mouth 2 (two) times daily.      Marland Kitchen aspirin EC 81 MG tablet Take 81 mg by mouth daily.      Marland Kitchen atorvastatin (LIPITOR) 20 MG tablet Take 1 tablet (20 mg total) by mouth every morning.  90 tablet  1  . Cyanocobalamin 2500 MCG CHEW Chew by mouth.      . meloxicam (MOBIC) 15 MG tablet Take 15 mg by mouth daily.      Marland Kitchen omeprazole (PRILOSEC) 20 MG capsule Take 20 mg by mouth daily.      Marland Kitchen RAPAFLO 8 MG CAPS capsule Take 8 mg by mouth daily.      Marland Kitchen testosterone cypionate (DEPOTESTOTERONE CYPIONATE) 200 MG/ML injection Inject 200 mLs into the muscle every 14 (fourteen) days.       No current facility-administered medications for this visit.    Allergies:   No Known  Allergies  Social History:  The patient  reports that he quit smoking about 29 years ago. His smoking use included Cigarettes. He has a 10 pack-year smoking history. He has never used smokeless tobacco. He reports that he drinks about one ounce of alcohol per week. He reports that he does not use illicit drugs.   ROS:  Please see the history of present illness.   No history compatible with stroke or TIA. Denies claudication. No edema.   All other systems reviewed and negative.   OBJECTIVE: VS:  BP 158/100  Pulse 75  Ht 5' 7"  (1.702 m)  Wt 199 lb 1.9 oz (90.32 kg)  BMI 31.18 kg/m2 Well nourished, well developed, in no acute distress, obese HEENT: normal Neck: JVD flat. Carotid bruit absent  Cardiac:  normal S1, S2; RRR; no murmur Lungs:   clear to auscultation bilaterally, no wheezing, rhonchi or rales Abd: soft, nontender, no hepatomegaly Ext: Edema absent. Pulses 2+ and symmetric Skin: warm and dry Neuro:  CNs 2-12 intact, no focal abnormalities noted  EKG:      normal sinus rhythm with left atrial abnormality    Signed, Illene Labrador III, MD 12/08/2013 2:11 PM

## 2013-12-17 DIAGNOSIS — E291 Testicular hypofunction: Secondary | ICD-10-CM | POA: Diagnosis not present

## 2013-12-18 ENCOUNTER — Ambulatory Visit (HOSPITAL_COMMUNITY): Payer: Medicare Other | Attending: Cardiology | Admitting: Radiology

## 2013-12-18 VITALS — BP 173/102 | HR 85 | Ht 67.0 in | Wt 200.0 lb

## 2013-12-18 DIAGNOSIS — R0609 Other forms of dyspnea: Secondary | ICD-10-CM | POA: Diagnosis not present

## 2013-12-18 DIAGNOSIS — I2510 Atherosclerotic heart disease of native coronary artery without angina pectoris: Secondary | ICD-10-CM | POA: Diagnosis not present

## 2013-12-18 DIAGNOSIS — I10 Essential (primary) hypertension: Secondary | ICD-10-CM | POA: Diagnosis not present

## 2013-12-18 MED ORDER — TECHNETIUM TC 99M SESTAMIBI GENERIC - CARDIOLITE
10.0000 | Freq: Once | INTRAVENOUS | Status: AC | PRN
  Administered 2013-12-18: 10 via INTRAVENOUS

## 2013-12-18 MED ORDER — TECHNETIUM TC 99M SESTAMIBI GENERIC - CARDIOLITE
30.0000 | Freq: Once | INTRAVENOUS | Status: AC | PRN
  Administered 2013-12-18: 30 via INTRAVENOUS

## 2013-12-18 NOTE — Progress Notes (Signed)
[facility] NUCLEAR MED [address] [place], [place] [address] [phone]    Cardiology Nuclear Med Study  Cole Martinez is a 68 y.o. male     MRN : [id number]     DOB: Jun 28, 1945  Procedure Date: 12/18/2013  Nuclear Med Background Indication for Stress Test:  Evaluation for Ischemia and Follow up CAD History:  CAD, MPI 2012 (normal) Ef 64% Cardiac Risk Factors: Hypertension  Symptoms:  DOE   Nuclear Pre-Procedure Caffeine/Decaff Intake:  None NPO After: 7:00pm   Lungs:  clear O2 Sat: 94% on room air. IV 0.9% NS with Angio Cath:  22g  IV Site: R Hand  IV Started by:  Crissie Figures, RN  Chest Size (in):  44 Cup Size: n/a  Height: 5' 7"  (1.702 m)  Weight:  200 lb (90.719 kg)  BMI:  Body mass index is 31.32 kg/(m^2). Tech Comments:  N/A    Nuclear Med Study 1 or 2 day study: 1 day  Stress Test Type:  Stress  Reading MD: N/A  Order Authorizing Provider:  Daneen Schick, MD  Resting Radionuclide: Technetium 55mSestamibi  Resting Radionuclide Dose: 11.0 mCi   Stress Radionuclide:  Technetium 970mestamibi  Stress Radionuclide Dose: 33.0 mCi           Stress Protocol Rest HR: 85 Stress HR: 133  Rest BP: 172/102 Stress BP: 183/84  Exercise Time (min): 7:30 METS: 9.3           Dose of Adenosine (mg):  n/a Dose of Lexiscan: n/a mg  Dose of Atropine (mg): n/a Dose of Dobutamine: n/a mcg/kg/min (at max HR)  Stress Test Technologist: ShGlade LloydBS-ES  Nuclear Technologist:  ElEarl ManyCNMT     Rest Procedure:  Myocardial perfusion imaging was performed at rest 45 minutes following the intravenous administration of Technetium 9966mstamibi. Rest ECG: NSR - Normal EKG  Stress Procedure:  The patient exercised on the treadmill utilizing the Bruce Protocol for 7:30 minutes. The patient stopped due to SOB, wheezing and denied any chest pain.  Technetium 59m49mtamibi was injected at peak exercise and myocardial perfusion imaging was performed after a  brief delay. Stress ECG: 1.5 mm downsloaping STD in the inferolateral leads that persist 6 minutes into recovery  QPS Raw Data Images:  Normal; no motion artifact; normal heart/lung ratio. Stress Images:  Normal homogeneous uptake in all areas of the myocardium. Rest Images:  Normal homogeneous uptake in all areas of the myocardium. Subtraction (SDS):  No evidence of ischemia. Transient Ischemic Dilatation (Normal <1.22):  1.05 Lung/Heart Ratio (Normal <0.45):  0.35  Quantitative Gated Spect Images QGS EDV:  115 ml QGS ESV:  57 ml  Impression Exercise Capacity:  Good exercise capacity. BP Response:  Hypertension at baseline, midly increases at peak stress. Clinical Symptoms:  No chest pain, but SOB and wheezing.  ECG Impression:  Significant ST abnormalities consistent with ischemia. Comparison with Prior Nuclear Study: No significant change from previous study  Overall Impression:  Low risk stress nuclear study with abnormal ECG portion. Normal perfusion with no evidence of ischemia or prior infarct. Prior scan with abnormal stress ECG..  LV Ejection Fraction: 50%.  LV Wall Motion:  NL LV Function; NL Wall Motion  NELSDorothy Spark15/2015

## 2013-12-23 ENCOUNTER — Telehealth: Payer: Self-pay | Admitting: Interventional Cardiology

## 2013-12-23 NOTE — Telephone Encounter (Signed)
Returned pt call.myoview results already given to pt by Rainey Pines, CMA.lmtcb if pt has additional questions

## 2013-12-23 NOTE — Telephone Encounter (Signed)
New message    Returning call back to nurse.

## 2013-12-31 DIAGNOSIS — E291 Testicular hypofunction: Secondary | ICD-10-CM | POA: Diagnosis not present

## 2014-01-12 DIAGNOSIS — H25013 Cortical age-related cataract, bilateral: Secondary | ICD-10-CM | POA: Diagnosis not present

## 2014-01-12 DIAGNOSIS — H2513 Age-related nuclear cataract, bilateral: Secondary | ICD-10-CM | POA: Diagnosis not present

## 2014-01-14 DIAGNOSIS — E291 Testicular hypofunction: Secondary | ICD-10-CM | POA: Diagnosis not present

## 2014-01-21 ENCOUNTER — Encounter: Payer: Self-pay | Admitting: *Deleted

## 2014-01-28 DIAGNOSIS — E291 Testicular hypofunction: Secondary | ICD-10-CM | POA: Diagnosis not present

## 2014-02-11 DIAGNOSIS — E291 Testicular hypofunction: Secondary | ICD-10-CM | POA: Diagnosis not present

## 2014-02-25 DIAGNOSIS — E291 Testicular hypofunction: Secondary | ICD-10-CM | POA: Diagnosis not present

## 2014-03-11 DIAGNOSIS — E291 Testicular hypofunction: Secondary | ICD-10-CM | POA: Diagnosis not present

## 2014-03-25 DIAGNOSIS — E291 Testicular hypofunction: Secondary | ICD-10-CM | POA: Diagnosis not present

## 2014-03-25 DIAGNOSIS — N401 Enlarged prostate with lower urinary tract symptoms: Secondary | ICD-10-CM | POA: Diagnosis not present

## 2014-03-25 DIAGNOSIS — N486 Induration penis plastica: Secondary | ICD-10-CM | POA: Diagnosis not present

## 2014-03-25 DIAGNOSIS — R310 Gross hematuria: Secondary | ICD-10-CM | POA: Diagnosis not present

## 2014-04-08 ENCOUNTER — Other Ambulatory Visit: Payer: Self-pay | Admitting: Dermatology

## 2014-04-08 DIAGNOSIS — L821 Other seborrheic keratosis: Secondary | ICD-10-CM | POA: Diagnosis not present

## 2014-04-08 DIAGNOSIS — L570 Actinic keratosis: Secondary | ICD-10-CM | POA: Diagnosis not present

## 2014-04-08 DIAGNOSIS — D485 Neoplasm of uncertain behavior of skin: Secondary | ICD-10-CM | POA: Diagnosis not present

## 2014-04-08 DIAGNOSIS — E291 Testicular hypofunction: Secondary | ICD-10-CM | POA: Diagnosis not present

## 2014-04-22 DIAGNOSIS — E291 Testicular hypofunction: Secondary | ICD-10-CM | POA: Diagnosis not present

## 2014-05-06 DIAGNOSIS — E291 Testicular hypofunction: Secondary | ICD-10-CM | POA: Diagnosis not present

## 2014-05-20 DIAGNOSIS — E291 Testicular hypofunction: Secondary | ICD-10-CM | POA: Diagnosis not present

## 2014-05-22 ENCOUNTER — Telehealth: Payer: Self-pay | Admitting: Oncology

## 2014-05-22 NOTE — Telephone Encounter (Signed)
S/w pt advised appt chg from 9/8 to 9/22 at 11am. Pt verbalized understanding and asks that I mail calendar. Mailed appt.

## 2014-05-26 ENCOUNTER — Other Ambulatory Visit: Payer: Self-pay | Admitting: Interventional Cardiology

## 2014-06-03 DIAGNOSIS — E291 Testicular hypofunction: Secondary | ICD-10-CM | POA: Diagnosis not present

## 2014-06-17 DIAGNOSIS — E291 Testicular hypofunction: Secondary | ICD-10-CM | POA: Diagnosis not present

## 2014-07-01 DIAGNOSIS — E291 Testicular hypofunction: Secondary | ICD-10-CM | POA: Diagnosis not present

## 2014-07-13 DIAGNOSIS — E291 Testicular hypofunction: Secondary | ICD-10-CM | POA: Diagnosis not present

## 2014-07-29 DIAGNOSIS — E291 Testicular hypofunction: Secondary | ICD-10-CM | POA: Diagnosis not present

## 2014-07-30 ENCOUNTER — Other Ambulatory Visit: Payer: Self-pay | Admitting: Gastroenterology

## 2014-07-30 DIAGNOSIS — K5000 Crohn's disease of small intestine without complications: Secondary | ICD-10-CM

## 2014-07-30 DIAGNOSIS — M1990 Unspecified osteoarthritis, unspecified site: Secondary | ICD-10-CM | POA: Diagnosis not present

## 2014-07-30 DIAGNOSIS — E780 Pure hypercholesterolemia: Secondary | ICD-10-CM | POA: Diagnosis not present

## 2014-07-30 DIAGNOSIS — R259 Unspecified abnormal involuntary movements: Secondary | ICD-10-CM | POA: Diagnosis not present

## 2014-07-30 DIAGNOSIS — E538 Deficiency of other specified B group vitamins: Secondary | ICD-10-CM | POA: Diagnosis not present

## 2014-07-30 DIAGNOSIS — Z86010 Personal history of colonic polyps: Secondary | ICD-10-CM | POA: Diagnosis not present

## 2014-07-30 DIAGNOSIS — I2510 Atherosclerotic heart disease of native coronary artery without angina pectoris: Secondary | ICD-10-CM | POA: Diagnosis not present

## 2014-07-30 DIAGNOSIS — N400 Enlarged prostate without lower urinary tract symptoms: Secondary | ICD-10-CM | POA: Diagnosis not present

## 2014-07-30 DIAGNOSIS — Z0001 Encounter for general adult medical examination with abnormal findings: Secondary | ICD-10-CM | POA: Diagnosis not present

## 2014-07-30 DIAGNOSIS — E291 Testicular hypofunction: Secondary | ICD-10-CM | POA: Diagnosis not present

## 2014-07-30 DIAGNOSIS — C9110 Chronic lymphocytic leukemia of B-cell type not having achieved remission: Secondary | ICD-10-CM | POA: Diagnosis not present

## 2014-07-30 DIAGNOSIS — F900 Attention-deficit hyperactivity disorder, predominantly inattentive type: Secondary | ICD-10-CM | POA: Diagnosis not present

## 2014-08-12 ENCOUNTER — Ambulatory Visit
Admission: RE | Admit: 2014-08-12 | Discharge: 2014-08-12 | Disposition: A | Discharge status: Home / Self Care | Payer: Medicare Other | Source: Ambulatory Visit | Attending: Gastroenterology | Admitting: Gastroenterology

## 2014-08-12 DIAGNOSIS — K5000 Crohn's disease of small intestine without complications: Secondary | ICD-10-CM

## 2014-08-12 DIAGNOSIS — E291 Testicular hypofunction: Secondary | ICD-10-CM | POA: Diagnosis not present

## 2014-08-12 DIAGNOSIS — K219 Gastro-esophageal reflux disease without esophagitis: Secondary | ICD-10-CM | POA: Diagnosis not present

## 2014-08-26 DIAGNOSIS — E291 Testicular hypofunction: Secondary | ICD-10-CM | POA: Diagnosis not present

## 2014-08-31 DIAGNOSIS — K5000 Crohn's disease of small intestine without complications: Secondary | ICD-10-CM | POA: Diagnosis not present

## 2014-09-03 DIAGNOSIS — R972 Elevated prostate specific antigen [PSA]: Secondary | ICD-10-CM | POA: Diagnosis not present

## 2014-09-03 DIAGNOSIS — E291 Testicular hypofunction: Secondary | ICD-10-CM | POA: Diagnosis not present

## 2014-09-23 DIAGNOSIS — E291 Testicular hypofunction: Secondary | ICD-10-CM | POA: Diagnosis not present

## 2014-09-30 DIAGNOSIS — R12 Heartburn: Secondary | ICD-10-CM | POA: Diagnosis not present

## 2014-09-30 DIAGNOSIS — K5000 Crohn's disease of small intestine without complications: Secondary | ICD-10-CM | POA: Diagnosis not present

## 2014-10-21 DIAGNOSIS — E291 Testicular hypofunction: Secondary | ICD-10-CM | POA: Diagnosis not present

## 2014-10-22 ENCOUNTER — Other Ambulatory Visit (INDEPENDENT_AMBULATORY_CARE_PROVIDER_SITE_OTHER): Payer: Medicare Other | Admitting: *Deleted

## 2014-10-22 DIAGNOSIS — E7800 Pure hypercholesterolemia, unspecified: Secondary | ICD-10-CM

## 2014-10-22 LAB — HEPATIC FUNCTION PANEL
ALT: 19 U/L (ref 0–53)
AST: 26 U/L (ref 0–37)
Albumin: 4 g/dL (ref 3.5–5.2)
Alkaline Phosphatase: 70 U/L (ref 39–117)
Bilirubin, Direct: 0.2 mg/dL (ref 0.0–0.3)
Total Bilirubin: 1.2 mg/dL (ref 0.2–1.2)
Total Protein: 6.4 g/dL (ref 6.0–8.3)

## 2014-10-22 LAB — LIPID PANEL
Cholesterol: 138 mg/dL (ref 0–200)
HDL: 42.5 mg/dL (ref >39.00)
LDL Cholesterol: 74 mg/dL (ref 0–99)
NonHDL: 95.83
Total CHOL/HDL Ratio: 3
Triglycerides: 111 mg/dL (ref 0.0–149.0)
VLDL: 22.2 mg/dL (ref 0.0–40.0)

## 2014-10-26 ENCOUNTER — Telehealth: Payer: Self-pay

## 2014-10-26 NOTE — Telephone Encounter (Signed)
-----   Message from Belva Crome, MD sent at 10/23/2014  5:37 PM EDT ----- Lipids are at target and liver function tests are normal. Repeat in one year.

## 2014-10-26 NOTE — Telephone Encounter (Signed)
Called to give lab results. Lmtcb. Results released to my chart

## 2014-10-29 ENCOUNTER — Other Ambulatory Visit: Payer: Self-pay

## 2014-10-29 ENCOUNTER — Other Ambulatory Visit: Payer: Medicare Other

## 2014-10-29 DIAGNOSIS — E785 Hyperlipidemia, unspecified: Secondary | ICD-10-CM

## 2014-11-04 DIAGNOSIS — E291 Testicular hypofunction: Secondary | ICD-10-CM | POA: Diagnosis not present

## 2014-11-12 ENCOUNTER — Ambulatory Visit: Payer: Medicare Other | Admitting: Oncology

## 2014-11-12 ENCOUNTER — Other Ambulatory Visit: Payer: Medicare Other

## 2014-11-16 DIAGNOSIS — M25461 Effusion, right knee: Secondary | ICD-10-CM | POA: Diagnosis not present

## 2014-11-16 DIAGNOSIS — M25561 Pain in right knee: Secondary | ICD-10-CM | POA: Diagnosis not present

## 2014-11-16 DIAGNOSIS — M1711 Unilateral primary osteoarthritis, right knee: Secondary | ICD-10-CM | POA: Diagnosis not present

## 2014-11-18 DIAGNOSIS — E291 Testicular hypofunction: Secondary | ICD-10-CM | POA: Diagnosis not present

## 2014-11-25 ENCOUNTER — Other Ambulatory Visit: Payer: Self-pay | Admitting: *Deleted

## 2014-11-25 DIAGNOSIS — C8300 Small cell B-cell lymphoma, unspecified site: Secondary | ICD-10-CM

## 2014-11-26 ENCOUNTER — Other Ambulatory Visit (HOSPITAL_BASED_OUTPATIENT_CLINIC_OR_DEPARTMENT_OTHER): Payer: Medicare Other

## 2014-11-26 ENCOUNTER — Ambulatory Visit (HOSPITAL_BASED_OUTPATIENT_CLINIC_OR_DEPARTMENT_OTHER): Payer: Medicare Other | Admitting: Oncology

## 2014-11-26 ENCOUNTER — Telehealth: Payer: Self-pay | Admitting: Oncology

## 2014-11-26 VITALS — BP 148/86 | HR 91 | Temp 98.2°F | Resp 18 | Ht 67.0 in | Wt 194.6 lb

## 2014-11-26 DIAGNOSIS — C8300 Small cell B-cell lymphoma, unspecified site: Secondary | ICD-10-CM

## 2014-11-26 DIAGNOSIS — E785 Hyperlipidemia, unspecified: Secondary | ICD-10-CM

## 2014-11-26 DIAGNOSIS — Z23 Encounter for immunization: Secondary | ICD-10-CM | POA: Diagnosis not present

## 2014-11-26 DIAGNOSIS — C9110 Chronic lymphocytic leukemia of B-cell type not having achieved remission: Secondary | ICD-10-CM

## 2014-11-26 LAB — CBC WITH DIFFERENTIAL/PLATELET
BASO%: 0.8 % (ref 0.0–2.0)
Basophils Absolute: 0.1 10*3/uL (ref 0.0–0.1)
EOS%: 2.9 % (ref 0.0–7.0)
Eosinophils Absolute: 0.3 10*3/uL (ref 0.0–0.5)
HCT: 50.7 % — ABNORMAL HIGH (ref 38.4–49.9)
HGB: 17 g/dL (ref 13.0–17.1)
LYMPH%: 16.1 % (ref 14.0–49.0)
MCH: 30.3 pg (ref 27.2–33.4)
MCHC: 33.5 g/dL (ref 32.0–36.0)
MCV: 90.2 fL (ref 79.3–98.0)
MONO#: 1 10*3/uL — ABNORMAL HIGH (ref 0.1–0.9)
MONO%: 9.3 % (ref 0.0–14.0)
NEUT#: 7.8 10*3/uL — ABNORMAL HIGH (ref 1.5–6.5)
NEUT%: 70.9 % (ref 39.0–75.0)
Platelets: 232 10*3/uL (ref 140–400)
RBC: 5.62 10*6/uL (ref 4.20–5.82)
RDW: 14.7 % — ABNORMAL HIGH (ref 11.0–14.6)
WBC: 11 10*3/uL — ABNORMAL HIGH (ref 4.0–10.3)
lymph#: 1.8 10*3/uL (ref 0.9–3.3)

## 2014-11-26 MED ORDER — INFLUENZA VAC SPLIT QUAD 0.5 ML IM SUSY
0.5000 mL | PREFILLED_SYRINGE | Freq: Once | INTRAMUSCULAR | Status: AC
  Administered 2014-11-26: 0.5 mL via INTRAMUSCULAR
  Filled 2014-11-26: qty 0.5

## 2014-11-26 NOTE — Progress Notes (Signed)
  [place] OFFICE PROGRESS NOTE   Diagnosis: CLL  INTERVAL HISTORY:   Mr. Mcniel returns as scheduled. He feels well. No fever, night sweats, or anorexia. His wife recently noted a skin lesion at the upper gluteal fold. He is now taking testosterone injections.  Objective:  Vital signs in last 24 hours:  Blood pressure 148/86, pulse 91, temperature 98.2 F (36.8 C), temperature source Oral, resp. rate 18, height 5' 7"  (1.702 m), weight 194 lb 9.6 oz (88.27 kg), SpO2 98 %.    HEENT: Neck without mass Lymphatics: No cervical, supra-clavicular, axillary, or inguinal nodes Resp: Lungs clear bilaterally Cardio: Regular rate and rhythm GI: No hepatosplenomegaly Vascular: No leg edema  Skin: Mild resolving yeast rash at the gluteal fold bilaterally with an area of superficial abrasion at the left upper gluteal fold-this appears to be healing     Lab Results:  Lab Results  Component Value Date   WBC 11.0* 11/26/2014   HGB 17.0 11/26/2014   HCT 50.7* 11/26/2014   MCV 90.2 11/26/2014   PLT 232 11/26/2014   NEUTROABS 7.8* 11/26/2014   absolute lymphocyte count 1.8   Medications: I have reviewed the patient's current medications.  Assessment/Plan: 1. Chronic lymphocytic leukemia, diagnosed in 1996. He remains asymptomatic and stable from a hematologic standpoint. 2. pneumococcal vaccine given on 10/18/2010 , 13 valent pneumonia vaccine given 11/13/2013    Disposition:  Mr. Ashmore remains asymptomatic from the CLL. He will return for an office visit and CBC in one year. He received an influenza vaccine today. The hemoglobin is higher. This may be related to testosterone injections. We will ask him to return for a CBC in 3 months.  Betsy Coder, MD  11/26/2014  12:16 PM

## 2014-11-26 NOTE — Telephone Encounter (Signed)
Pt confirmed labs/ov per 09/22 POF, gave pt AVS and Calendar... KJ

## 2014-11-27 ENCOUNTER — Telehealth: Payer: Self-pay | Admitting: *Deleted

## 2014-11-27 DIAGNOSIS — C8300 Small cell B-cell lymphoma, unspecified site: Secondary | ICD-10-CM

## 2014-11-27 NOTE — Telephone Encounter (Signed)
Per Dr. Benay Spice; notified pt that hb is higher, may be due to testosterone; office will repeat lab in 3 months.  "what happens if it gets too high"  Per Dr. Benay Spice; informed pt that he could have stroke, blood clot, etc; if pt wants to repeat in 2 months MD is Okay with that.  Pt verbalized he would like it repeated in 2 months.  Pt verbalized understanding to call if problems.

## 2014-11-30 ENCOUNTER — Telehealth: Payer: Self-pay | Admitting: Oncology

## 2014-11-30 NOTE — Telephone Encounter (Signed)
Called and left a message with lab appointment

## 2014-12-02 DIAGNOSIS — E291 Testicular hypofunction: Secondary | ICD-10-CM | POA: Diagnosis not present

## 2014-12-09 ENCOUNTER — Ambulatory Visit (INDEPENDENT_AMBULATORY_CARE_PROVIDER_SITE_OTHER): Payer: Medicare Other | Admitting: Interventional Cardiology

## 2014-12-09 ENCOUNTER — Encounter: Payer: Self-pay | Admitting: Interventional Cardiology

## 2014-12-09 VITALS — BP 154/94 | HR 75 | Ht 68.0 in | Wt 193.8 lb

## 2014-12-09 DIAGNOSIS — I2510 Atherosclerotic heart disease of native coronary artery without angina pectoris: Secondary | ICD-10-CM | POA: Diagnosis not present

## 2014-12-09 DIAGNOSIS — IMO0001 Reserved for inherently not codable concepts without codable children: Secondary | ICD-10-CM

## 2014-12-09 DIAGNOSIS — I10 Essential (primary) hypertension: Secondary | ICD-10-CM

## 2014-12-09 DIAGNOSIS — E785 Hyperlipidemia, unspecified: Secondary | ICD-10-CM | POA: Diagnosis not present

## 2014-12-09 DIAGNOSIS — R030 Elevated blood-pressure reading, without diagnosis of hypertension: Secondary | ICD-10-CM

## 2014-12-09 MED ORDER — AMLODIPINE BESYLATE 2.5 MG PO TABS
2.5000 mg | ORAL_TABLET | Freq: Every day | ORAL | Status: DC
Start: 2014-12-09 — End: 2015-05-18

## 2014-12-09 NOTE — Progress Notes (Signed)
Cardiology Office Note   Date:  12/09/2014   ID:  Cole Martinez, DOB 05-03-1945, MRN [id number]  PCP:  Cole Fair, MD  Cardiologist:  Cole Grooms, MD   Chief Complaint  Patient presents with  . Coronary Artery Disease      History of Present Illness: Cole Martinez is a 69 y.o. male who presents for CAD with bare-metal stent in LAD, hyperlipidemia, elevated BP but not diagnosed hypertension, and obesity.  He has been having difficulty with decreased energy and erectile dysfunction for which testosterone therapy was started. He subsequently has developed an elevation in hemoglobin greater than 17.  The testosterone has not significantly changed his erectile function.  No cardiovascular complaints. As on last year, he has fleeting chest discomfort that is nonexertional    Past Medical History  Diagnosis Date  . Hyperlipidemia   . S/P coronary artery stent placement OCT 1999 OF LAD  . B12 deficiency   . Crohn's disease of ileum ([place]) SINCE 1988  . H/O adenomatous polyp of colon   . Peyronie disease   . BPH (benign prostatic hypertrophy)   . Tremor, hereditary, benign MILD RIGHT HAND  . Elevated PSA   . Nocturia   . Coronary artery disease CARDIOLOGIST- DR Cole Martinez    S/P STENTING LAD 1999  . ED (erectile dysfunction)   . ADHD (attention deficit hyperactivity disorder)   . OA (osteoarthritis)   . Chronic lymphocytic leukemia (CLL), T-cell ([place]) DX 1996--  ONCOLOGIST-  DR [facility]    PT IS ASYMPTOMATIC--- LAST CBC W/ DIFF 06-24-2012 STABLE  . Essential and other specified forms of tremor 11/25/2012  . Peripheral neuropathy (HCC)     hx of, none current as of 08-04-13    Past Surgical History  Procedure Laterality Date  . Coronary angioplasty with stent placement  OCT 1999    STENT OF LAD  . Laparoscopic inguinal hernia repair Bilateral 01-10-2004    W/ MESH  . Removal left neck lymph node  1996  . Prostate surgery  2002    tuna  . Tonsillectomy   CHILD  . Cardiovascular stress test  11-01-2010 DR Cole Martinez    NORMAL PERFUSION STUDY/ EF 64%/ NO ISCHEMIA  . Prostate biopsy N/A 07/12/2012    Procedure: PROSTATE BIOPSY AND ULTRASOUND;  Surgeon: Cole Rud, MD;  Location: [facility];  Service: Urology;  Laterality: N/A;  . Neck benign removed from neck  yrs ago  . Colonoscopy with propofol N/A 09/24/2012    Procedure: COLONOSCOPY WITH PROPOFOL;  Surgeon: Cole Fair, MD;  Location: [facility];  Service: Endoscopy;  Laterality: N/A;  . Transurethral resection of prostate N/A 08/08/2013    Procedure: TRANSURETHRAL RESECTION OF THE PROSTATE WITH GYRUS INSTRUMENTS;  Surgeon: Cole Rud, MD;  Location: [facility];  Service: Urology;  Laterality: N/A;  . Transurethral resection of prostate       Current Outpatient Prescriptions  Medication Sig Dispense Refill  . amphetamine-dextroamphetamine (ADDERALL) 5 MG tablet Take 5 mg by mouth 2 (two) times daily.    Marland Kitchen aspirin EC 81 MG tablet Take 81 mg by mouth daily.    Marland Kitchen atorvastatin (LIPITOR) 20 MG tablet TAKE ONE TABLET BY MOUTH IN THE MORNING 90 tablet 1  . Celecoxib (CELEBREX PO) Take 1 capsule by mouth daily.    . Cyanocobalamin 2500 MCG CHEW Chew by mouth.    Marland Kitchen omeprazole (PRILOSEC) 20 MG capsule Take 20 mg by mouth daily.    Marland Kitchen  testosterone cypionate (DEPOTESTOTERONE CYPIONATE) 200 MG/ML injection Inject 200 mLs into the muscle every 14 (fourteen) days.     No current facility-administered medications for this visit.    Allergies:   Review of patient's allergies indicates no known allergies.    Social History:  The patient  reports that he quit smoking about 30 years ago. His smoking use included Cigarettes. He has a 10 pack-year smoking history. He has never used smokeless tobacco. He reports that he drinks about 1.0 oz of alcohol per week. He reports that he does not use illicit drugs.   Family History:  The patient's family history includes Diabetes  in his brother; Obesity in his brother; Parkinsonism in his brother.    ROS:  Please see the history of present illness.   Otherwise, review of systems are positive for excessive sweating, leg pain, cough, erectile dysfunction, snoring, difficulty with balance, back pain, and muscle pain.R.   All other systems are reviewed and negative.    PHYSICAL EXAM: VS:  BP 154/94 mmHg  Pulse 75  Ht 5' 8"  (1.727 m)  Wt 87.907 kg (193 lb 12.8 oz)  BMI 29.47 kg/m2 , BMI Body mass index is 29.47 kg/(m^2). GEN: Well nourished, well developed, in no acute distress. Obese HEENT: normal Neck: no JVD, carotid bruits, or masses Cardiac: RRR.  There is no murmur, rub, or gallop. There is no edema. Respiratory:  clear to auscultation bilaterally, normal work of breathing. GI: soft, nontender, nondistended, + BS MS: no deformity or atrophy Skin: warm and dry, no rash Neuro:  Strength and sensation are intact Psych: euthymic mood, full affect   EKG:  EKG is ordered today. The ekg reveals normal   Recent Labs: 10/22/2014: ALT 19 11/26/2014: HGB 17.0; Platelets 232    Lipid Panel    Component Value Date/Time   CHOL 138 10/22/2014 0806   TRIG 111.0 10/22/2014 0806   HDL 42.50 10/22/2014 0806   CHOLHDL 3 10/22/2014 0806   VLDL 22.2 10/22/2014 0806   LDLCALC 74 10/22/2014 0806      Wt Readings from Last 3 Encounters:  12/09/14 87.907 kg (193 lb 12.8 oz)  11/26/14 88.27 kg (194 lb 9.6 oz)  12/18/13 90.719 kg (200 lb)      Other studies Reviewed: Additional studies/ records that were reviewed today include: labs and notes from consultants. 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    ASSESSMENT AND PLAN:  1. Coronary artery disease involving native coronary artery of native heart without angina pectoris Asymptomatic  2. Hyperlipidemia At target in August with LDL 74  3. Elevated blood pressure, with essential hypertension now being diagnosed. We will make the  diagnosis of hypertension and start the patient on therapy, amlodipine 2.5 mg daily  4. Elevated hemoglobin Currently being evaluated. A concern is whether testosterone could be driving up the hemoglobin and the blood pressure.    Current medicines are reviewed at length with the patient today.  The patient has the following concerns regarding medicines: He wonders if testosterone injections could be elevating his blood pressure.  The following changes/actions have been instituted:    Amlodipine 2.5 mg daily with follow-up in hypertension clinic in 2-4 weeks and anticipate further increase in dose.  Low-salt diet and aerobic activity.  Labs/ tests ordered today include:  No orders of the defined types were placed in this encounter.     Disposition:   FU with HS in 1 year  Signed, Cole Grooms, MD  12/09/2014 8:09 AM    [address], [place], [place]  [address] Phone: [phone]; Fax: [phone]

## 2014-12-09 NOTE — Patient Instructions (Signed)
Medication Instructions:  Your physician has recommended you make the following change in your medication:  START Amlodipine 2.12m daily. An Rx has been sent to your pharmacy   Labwork: None ordered  Testing/Procedures: None ordered  Follow-Up: [facility]   Your physician wants you to follow-up in: 1 year with Dr.Smith You will receive a reminder letter in the mail two months in advance. If you don't receive a letter, please call our office to schedule the follow-up appointment.     Any Other Special Instructions Will Be Listed Below (If Applicable).

## 2014-12-25 ENCOUNTER — Other Ambulatory Visit: Payer: Self-pay | Admitting: Interventional Cardiology

## 2014-12-30 ENCOUNTER — Ambulatory Visit (INDEPENDENT_AMBULATORY_CARE_PROVIDER_SITE_OTHER): Payer: Medicare Other | Admitting: Pharmacist

## 2014-12-30 VITALS — BP 118/82 | Wt 189.5 lb

## 2014-12-30 DIAGNOSIS — I10 Essential (primary) hypertension: Secondary | ICD-10-CM

## 2014-12-30 DIAGNOSIS — I2510 Atherosclerotic heart disease of native coronary artery without angina pectoris: Secondary | ICD-10-CM

## 2014-12-30 NOTE — Patient Instructions (Signed)
Continue your amlodipine 2.42m once daily.  Try to limit the extra salt and caffeine intake each day.   Check your blood pressure and write it down several times a week.  If it consistenly above 140/90, please call SGay Fillerat [phone] we will check your cuff and make sure it is accurate.

## 2014-12-30 NOTE — Progress Notes (Signed)
HPI: Cole Martinez is a 69 y.o. male referred by Dr. Tamala Julian to HTN clinic.  He was recently diagnosed with HTN at his OV with Dr. Tamala Julian on 10/5.  He had slightly elevated BP at other visits in the past but not >150/90.  He was started on amlodipine 2.66m daily at that time.  Pt reports compliance with medications and no side effects.  He did not take his medication prior to today's appointment.  He has also stopped taking his testosterone injections due to elevated Hgb and lack of efficacy for ED.   Current HTN meds: amlodipine 2.548mdaily Previously tried: none BP goal: <140/80  Diet: He eats a fairly healthy, balanced diet.  He does add salt to most meals and has 2 cups of caffienated coffee in the morning and [employer] caffienated tea during the day.   Exercise: he uses an eliptical and recumbent bike or walks at least 6 days a week  Home BP readings: Pt did not bring his readings in.  He did state he was averaging around 146/88 at home last week.   Wt Readings from Last 3 Encounters:  12/30/14 189 lb 8 oz (85.957 kg)  12/09/14 193 lb 12.8 oz (87.907 kg)  11/26/14 194 lb 9.6 oz (88.27 kg)   BP Readings from Last 3 Encounters:  12/30/14 118/82  12/09/14 154/94  11/26/14 148/86   Pulse Readings from Last 3 Encounters:  12/09/14 75  11/26/14 91  12/18/13 85     Past Medical History  Diagnosis Date  . Hyperlipidemia   . S/P coronary artery stent placement OCT 1999 OF LAD  . B12 deficiency   . Cro[place]'s disease of [place]um [place]  . H/O adenomatous polyp of colon   . Peyronie disease   . BPH (benign prostatic hypertrophy)   . Tremor, hereditary, benign MILD RIGHT HAND  . Elevated PSA   . Nocturia   . Coronary artery disease CARDIOLOGIST- DR HEDaneen Schick  S/P STENTING LAD 1999  . ED (erectile dysfunction)   . ADHD (attention deficit hyperactivity disorder)   . OA (osteoarthritis)   . Chronic lymphocytic leukemia (CLL), T-cell ([place] 1996--   [facility]  PT IS ASYMPTOMATIC--- LAST CBC W/ DIFF 06-24-2012 STABLE  . Essential and other specified forms of tremor 11/25/2012  . Peripheral neuropathy (HCC)     hx of, none current as of 08-04-13    Current Outpatient Prescriptions on File Prior to Visit  Medication Sig Dispense Refill  . amLODipine (NORVASC) 2.5 MG tablet Take 1 tablet (2.5 mg total) by mouth daily. 30 tablet 11  . amphetamine-dextroamphetamine (ADDERALL) 5 MG tablet Take 5 mg by mouth 2 (two) times daily.    . Marland Kitchenspirin EC 81 MG tablet Take 81 mg by mouth daily.    . Marland Kitchentorvastatin (LIPITOR) 20 MG tablet TAKE ONE TABLET BY MOUTH IN THE MORNING 90 tablet 1  . Celecoxib (CELEBREX PO) Take 1 capsule by mouth daily.    . Cyanocobalamin 2500 MCG CHEW Chew by mouth.    . Marland Kitchenmeprazole (PRILOSEC) 20 MG capsule Take 20 mg by mouth daily.     No current facility-administered medications on file prior to visit.    No Known Allergies   Assessment/Plan: 1.  Hypertension- Pt's BP controlled today even without dose of amlodipine.  Improvement likely related to combination of low dose amlodipine and stopping testosterone.  He is not having any side  effects.  Will continue current therapy.  Asked him to monitor BP at home and contact office if it is consistently > 140/90.  Also counseled on lifestyle modifications of salt and caffeine restriction.

## 2015-01-07 DIAGNOSIS — M1711 Unilateral primary osteoarthritis, right knee: Secondary | ICD-10-CM | POA: Diagnosis not present

## 2015-01-14 DIAGNOSIS — M25561 Pain in right knee: Secondary | ICD-10-CM | POA: Diagnosis not present

## 2015-01-22 ENCOUNTER — Other Ambulatory Visit (HOSPITAL_BASED_OUTPATIENT_CLINIC_OR_DEPARTMENT_OTHER): Payer: Medicare Other

## 2015-01-22 ENCOUNTER — Telehealth: Payer: Self-pay | Admitting: *Deleted

## 2015-01-22 DIAGNOSIS — C8300 Small cell B-cell lymphoma, unspecified site: Secondary | ICD-10-CM

## 2015-01-22 LAB — CBC WITH DIFFERENTIAL/PLATELET
BASO%: 0.8 % (ref 0.0–2.0)
Basophils Absolute: 0.1 10*3/uL (ref 0.0–0.1)
EOS%: 4.1 % (ref 0.0–7.0)
Eosinophils Absolute: 0.4 10*3/uL (ref 0.0–0.5)
HCT: 49.2 % (ref 38.4–49.9)
HGB: 16.5 g/dL (ref 13.0–17.1)
LYMPH%: 17.8 % (ref 14.0–49.0)
MCH: 30.3 pg (ref 27.2–33.4)
MCHC: 33.6 g/dL (ref 32.0–36.0)
MCV: 90 fL (ref 79.3–98.0)
MONO#: 0.6 10*3/uL (ref 0.1–0.9)
MONO%: 6.2 % (ref 0.0–14.0)
NEUT#: 6.7 10*3/uL — ABNORMAL HIGH (ref 1.5–6.5)
NEUT%: 71.1 % (ref 39.0–75.0)
Platelets: 211 10*3/uL (ref 140–400)
RBC: 5.47 10*6/uL (ref 4.20–5.82)
RDW: 14.1 % (ref 11.0–14.6)
WBC: 9.4 10*3/uL (ref 4.0–10.3)
lymph#: 1.7 10*3/uL (ref 0.9–3.3)

## 2015-01-22 NOTE — Telephone Encounter (Signed)
-----   Message from Ladell Pier, MD sent at 01/22/2015  3:51 PM EST ----- Please call patient, hb is lower, repeat cbc 4 months

## 2015-01-25 NOTE — Telephone Encounter (Signed)
Left message on voicemail informing pt schedulers will call with lab appt in 4 mos.

## 2015-01-26 ENCOUNTER — Telehealth: Payer: Self-pay | Admitting: Oncology

## 2015-01-26 DIAGNOSIS — M25561 Pain in right knee: Secondary | ICD-10-CM | POA: Diagnosis not present

## 2015-01-26 NOTE — Telephone Encounter (Signed)
Lt mess regarding future lab on 05/20/15

## 2015-01-27 NOTE — Telephone Encounter (Signed)
Returned call to pt, informed him of HGB result. He voiced understanding. Will repeat lab in 4 mos.

## 2015-02-09 DIAGNOSIS — N138 Other obstructive and reflux uropathy: Secondary | ICD-10-CM | POA: Diagnosis not present

## 2015-02-09 DIAGNOSIS — N401 Enlarged prostate with lower urinary tract symptoms: Secondary | ICD-10-CM | POA: Diagnosis not present

## 2015-02-09 DIAGNOSIS — N5201 Erectile dysfunction due to arterial insufficiency: Secondary | ICD-10-CM | POA: Diagnosis not present

## 2015-02-09 DIAGNOSIS — R350 Frequency of micturition: Secondary | ICD-10-CM | POA: Diagnosis not present

## 2015-02-09 DIAGNOSIS — E291 Testicular hypofunction: Secondary | ICD-10-CM | POA: Diagnosis not present

## 2015-03-16 DIAGNOSIS — J069 Acute upper respiratory infection, unspecified: Secondary | ICD-10-CM | POA: Diagnosis not present

## 2015-05-18 ENCOUNTER — Other Ambulatory Visit: Payer: Self-pay | Admitting: *Deleted

## 2015-05-18 MED ORDER — AMLODIPINE BESYLATE 2.5 MG PO TABS: 2.5000 mg | ORAL_TABLET | Freq: Every day | ORAL | Status: DC

## 2015-05-18 MED ORDER — ATORVASTATIN CALCIUM 20 MG PO TABS: 20.0000 mg | ORAL_TABLET | Freq: Every morning | ORAL | Status: DC

## 2015-05-20 ENCOUNTER — Telehealth: Payer: Self-pay | Admitting: *Deleted

## 2015-05-20 ENCOUNTER — Other Ambulatory Visit (HOSPITAL_BASED_OUTPATIENT_CLINIC_OR_DEPARTMENT_OTHER): Payer: Medicare Other

## 2015-05-20 DIAGNOSIS — C8300 Small cell B-cell lymphoma, unspecified site: Secondary | ICD-10-CM | POA: Diagnosis present

## 2015-05-20 LAB — CBC WITH DIFFERENTIAL/PLATELET
BASO%: 0.6 % (ref 0.0–2.0)
Basophils Absolute: 0 10*3/uL (ref 0.0–0.1)
EOS%: 2.7 % (ref 0.0–7.0)
Eosinophils Absolute: 0.2 10*3/uL (ref 0.0–0.5)
HCT: 45.3 % (ref 38.4–49.9)
HGB: 15.4 g/dL (ref 13.0–17.1)
LYMPH%: 23 % (ref 14.0–49.0)
MCH: 31.1 pg (ref 27.2–33.4)
MCHC: 34 g/dL (ref 32.0–36.0)
MCV: 91.5 fL (ref 79.3–98.0)
MONO#: 0.7 10*3/uL (ref 0.1–0.9)
MONO%: 9.7 % (ref 0.0–14.0)
NEUT#: 4.3 10*3/uL (ref 1.5–6.5)
NEUT%: 64 % (ref 39.0–75.0)
Platelets: 213 10*3/uL (ref 140–400)
RBC: 4.95 10*6/uL (ref 4.20–5.82)
RDW: 13.4 % (ref 11.0–14.6)
WBC: 6.7 10*3/uL (ref 4.0–10.3)
lymph#: 1.5 10*3/uL (ref 0.9–3.3)

## 2015-05-20 NOTE — Telephone Encounter (Signed)
Per Dr. Benay Spice, pt notified of his hemoglobin results.  Pt appreciative of call and will f/u in September as scheduled.

## 2015-05-20 NOTE — Telephone Encounter (Signed)
-----   Message from Ladell Pier, MD sent at 05/20/2015  4:39 PM EDT ----- Please call patient, hemoglobin is lower, f/u as scheduled

## 2015-05-31 ENCOUNTER — Other Ambulatory Visit: Payer: Self-pay | Admitting: *Deleted

## 2015-05-31 ENCOUNTER — Telehealth: Payer: Self-pay | Admitting: *Deleted

## 2015-05-31 MED ORDER — AMLODIPINE BESYLATE 2.5 MG PO TABS: 2.5000 mg | ORAL_TABLET | Freq: Every day | ORAL | Status: DC

## 2015-05-31 MED ORDER — ATORVASTATIN CALCIUM 20 MG PO TABS: 20.0000 mg | ORAL_TABLET | Freq: Every morning | ORAL | Status: DC

## 2015-05-31 NOTE — Telephone Encounter (Signed)
atorvastatin (LIPITOR) 20 MG tablet  Medication   Date: 05/18/2015  Department: [facility]  Ordering/Authorizing: Belva Crome, MD      Order Providers    Prescribing Provider Encounter Provider   Belva Crome, MD Rush Landmark, CMA    Medication Detail      Disp Refills Start End     atorvastatin (LIPITOR) 20 MG tablet 90 tablet 0 05/18/2015     Sig - Route: Take 1 tablet (20 mg total) by mouth every morning. - Oral    E-Prescribing Status: Receipt confirmed by pharmacy (05/18/2015 5:23 PM EDT)     [place], [address]   Will have [facility] place on hold until needed.

## 2015-05-31 NOTE — Telephone Encounter (Signed)
pharmacy he uses, so when the refill was sent it was [facility], he wants them all sent to [facility], changed the pharmacy and will send them there from here on out. He will pick up a 30 day from [facility] as he needs the medication now and cant wait for the mail in, he will have [facility] request his mes from [place] again also i gave him the fax number for here, [phone]. He expressed understanding.

## 2015-06-03 ENCOUNTER — Other Ambulatory Visit: Payer: Self-pay | Admitting: *Deleted

## 2015-06-03 MED ORDER — AMLODIPINE BESYLATE 2.5 MG PO TABS: 2.5000 mg | ORAL_TABLET | Freq: Every day | ORAL | Status: DC

## 2015-07-22 DIAGNOSIS — H25013 Cortical age-related cataract, bilateral: Secondary | ICD-10-CM | POA: Diagnosis not present

## 2015-07-22 DIAGNOSIS — H2513 Age-related nuclear cataract, bilateral: Secondary | ICD-10-CM | POA: Diagnosis not present

## 2015-08-09 DIAGNOSIS — K5000 Crohn's disease of small intestine without complications: Secondary | ICD-10-CM | POA: Diagnosis not present

## 2015-08-09 DIAGNOSIS — N401 Enlarged prostate with lower urinary tract symptoms: Secondary | ICD-10-CM | POA: Diagnosis not present

## 2015-08-09 DIAGNOSIS — E7800 Pure hypercholesterolemia, unspecified: Secondary | ICD-10-CM | POA: Diagnosis not present

## 2015-08-11 DIAGNOSIS — R350 Frequency of micturition: Secondary | ICD-10-CM | POA: Diagnosis not present

## 2015-08-11 DIAGNOSIS — N5201 Erectile dysfunction due to arterial insufficiency: Secondary | ICD-10-CM | POA: Diagnosis not present

## 2015-08-11 DIAGNOSIS — E291 Testicular hypofunction: Secondary | ICD-10-CM | POA: Diagnosis not present

## 2015-08-11 DIAGNOSIS — Z0000 Encounter for general adult medical examination without abnormal findings: Secondary | ICD-10-CM | POA: Diagnosis not present

## 2015-08-11 DIAGNOSIS — Z1389 Encounter for screening for other disorder: Secondary | ICD-10-CM | POA: Diagnosis not present

## 2015-08-11 DIAGNOSIS — N403 Nodular prostate with lower urinary tract symptoms: Secondary | ICD-10-CM | POA: Diagnosis not present

## 2015-08-11 DIAGNOSIS — N486 Induration penis plastica: Secondary | ICD-10-CM | POA: Diagnosis not present

## 2015-08-11 DIAGNOSIS — Z6829 Body mass index (BMI) 29.0-29.9, adult: Secondary | ICD-10-CM | POA: Diagnosis not present

## 2015-08-18 DIAGNOSIS — E038 Other specified hypothyroidism: Secondary | ICD-10-CM | POA: Diagnosis not present

## 2015-08-21 ENCOUNTER — Other Ambulatory Visit: Payer: Self-pay | Admitting: Interventional Cardiology

## 2015-11-17 DIAGNOSIS — M1990 Unspecified osteoarthritis, unspecified site: Secondary | ICD-10-CM | POA: Diagnosis not present

## 2015-11-17 DIAGNOSIS — C9110 Chronic lymphocytic leukemia of B-cell type not having achieved remission: Secondary | ICD-10-CM | POA: Diagnosis not present

## 2015-11-17 DIAGNOSIS — N400 Enlarged prostate without lower urinary tract symptoms: Secondary | ICD-10-CM | POA: Diagnosis not present

## 2015-11-17 DIAGNOSIS — F900 Attention-deficit hyperactivity disorder, predominantly inattentive type: Secondary | ICD-10-CM | POA: Diagnosis not present

## 2015-11-17 DIAGNOSIS — K5000 Crohn's disease of small intestine without complications: Secondary | ICD-10-CM | POA: Diagnosis not present

## 2015-11-17 DIAGNOSIS — E538 Deficiency of other specified B group vitamins: Secondary | ICD-10-CM | POA: Diagnosis not present

## 2015-11-17 DIAGNOSIS — Z23 Encounter for immunization: Secondary | ICD-10-CM | POA: Diagnosis not present

## 2015-11-17 DIAGNOSIS — I2510 Atherosclerotic heart disease of native coronary artery without angina pectoris: Secondary | ICD-10-CM | POA: Diagnosis not present

## 2015-11-17 DIAGNOSIS — E291 Testicular hypofunction: Secondary | ICD-10-CM | POA: Diagnosis not present

## 2015-11-17 DIAGNOSIS — Z86010 Personal history of colonic polyps: Secondary | ICD-10-CM | POA: Diagnosis not present

## 2015-11-17 DIAGNOSIS — R259 Unspecified abnormal involuntary movements: Secondary | ICD-10-CM | POA: Diagnosis not present

## 2015-11-17 DIAGNOSIS — E7800 Pure hypercholesterolemia, unspecified: Secondary | ICD-10-CM | POA: Diagnosis not present

## 2015-11-22 ENCOUNTER — Other Ambulatory Visit: Payer: Self-pay | Admitting: Interventional Cardiology

## 2015-11-23 ENCOUNTER — Ambulatory Visit (HOSPITAL_BASED_OUTPATIENT_CLINIC_OR_DEPARTMENT_OTHER): Payer: Medicare Other | Admitting: Oncology

## 2015-11-23 ENCOUNTER — Telehealth: Payer: Self-pay | Admitting: Oncology

## 2015-11-23 ENCOUNTER — Other Ambulatory Visit (HOSPITAL_BASED_OUTPATIENT_CLINIC_OR_DEPARTMENT_OTHER): Payer: Medicare Other

## 2015-11-23 VITALS — BP 129/82 | HR 83 | Temp 97.9°F | Resp 16 | Ht 68.0 in | Wt 190.2 lb

## 2015-11-23 DIAGNOSIS — C8300 Small cell B-cell lymphoma, unspecified site: Secondary | ICD-10-CM

## 2015-11-23 DIAGNOSIS — C9110 Chronic lymphocytic leukemia of B-cell type not having achieved remission: Secondary | ICD-10-CM

## 2015-11-23 DIAGNOSIS — Z23 Encounter for immunization: Secondary | ICD-10-CM

## 2015-11-23 LAB — CBC WITH DIFFERENTIAL/PLATELET
BASO%: 0.7 % (ref 0.0–2.0)
Basophils Absolute: 0.1 10*3/uL (ref 0.0–0.1)
EOS%: 5.2 % (ref 0.0–7.0)
Eosinophils Absolute: 0.4 10*3/uL (ref 0.0–0.5)
HCT: 44.8 % (ref 38.4–49.9)
HGB: 14.6 g/dL (ref 13.0–17.1)
LYMPH%: 23.5 % (ref 14.0–49.0)
MCH: 29.3 pg (ref 27.2–33.4)
MCHC: 32.6 g/dL (ref 32.0–36.0)
MCV: 90.1 fL (ref 79.3–98.0)
MONO#: 0.8 10*3/uL (ref 0.1–0.9)
MONO%: 9.2 % (ref 0.0–14.0)
NEUT#: 5.1 10*3/uL (ref 1.5–6.5)
NEUT%: 61.4 % (ref 39.0–75.0)
Platelets: 227 10*3/uL (ref 140–400)
RBC: 4.97 10*6/uL (ref 4.20–5.82)
RDW: 13.8 % (ref 11.0–14.6)
WBC: 8.4 10*3/uL (ref 4.0–10.3)
lymph#: 2 10*3/uL (ref 0.9–3.3)

## 2015-11-23 NOTE — Progress Notes (Signed)
Error

## 2015-11-23 NOTE — Telephone Encounter (Signed)
Gave patient avs report and appointments for September 2017 and September 2018.

## 2015-11-23 NOTE — Progress Notes (Signed)
  [facility] OFFICE PROGRESS NOTE   Diagnosis: CLL  INTERVAL HISTORY:   Cole Martinez returns as scheduled. He reports lethargy since discontinuing testosterone last year. No fever, night sweats, anorexia, or recent infection. He hasn't received a yearly influenza vaccine. He reports impotence and decreased libido. He has been referred to Dr. Buddy Duty.  Objective:  Vital signs in last 24 hours:  Blood pressure 129/82, pulse 83, temperature 97.9 F (36.6 C), temperature source Oral, resp. rate 16, height 5' 8"  (1.727 m), weight 190 lb 3.2 oz (86.3 kg), SpO2 98 %.    HEENT: Neck without mass Lymphatics: No cervical, supraclavicular, axillary, or inguinal nodes Resp: Lungs clear bilaterally Cardio: Regular rate and rhythm GI: No hepato-splenomegaly, no mass, nontender Vascular: No leg edema   Lab Results:  Lab Results  Component Value Date   WBC 8.4 11/23/2015   HGB 14.6 11/23/2015   HCT 44.8 11/23/2015   MCV 90.1 11/23/2015   PLT 227 11/23/2015   NEUTROABS 5.1 11/23/2015     Medications: I have reviewed the patient's current medications.  Assessment/Plan: 1. Chronic lymphocytic leukemia, diagnosed in 1996. He remains asymptomatic and stable from a hematologic standpoint. 2. pneumococcal vaccine given on 10/18/2010 , 13 valent pneumonia vaccine given 11/13/2013    Disposition: Mr. Macomber remains asymptomatic from the CLL. He is stable from a hematologic standpoint. He will return for an office visit and CBC in one year. He will return in one week for a 23 valent pneumococcal vaccine.   Betsy Coder, MD  11/23/2015  4:47 PM

## 2015-11-25 ENCOUNTER — Ambulatory Visit: Payer: Medicare Other | Admitting: Oncology

## 2015-11-25 ENCOUNTER — Other Ambulatory Visit: Payer: Medicare Other

## 2015-11-26 ENCOUNTER — Encounter: Payer: Self-pay | Admitting: Interventional Cardiology

## 2015-11-30 ENCOUNTER — Ambulatory Visit (HOSPITAL_BASED_OUTPATIENT_CLINIC_OR_DEPARTMENT_OTHER): Payer: Medicare Other

## 2015-11-30 DIAGNOSIS — Z23 Encounter for immunization: Secondary | ICD-10-CM

## 2015-11-30 DIAGNOSIS — C8300 Small cell B-cell lymphoma, unspecified site: Secondary | ICD-10-CM

## 2015-11-30 MED ORDER — PNEUMOCOCCAL VAC POLYVALENT 25 MCG/0.5ML IJ INJ
0.5000 mL | INJECTION | Freq: Once | INTRAMUSCULAR | Status: AC
  Administered 2015-11-30: 0.5 mL via INTRAMUSCULAR
  Filled 2015-11-30: qty 0.5

## 2015-11-30 NOTE — Patient Instructions (Signed)
Pneumococcal Polysaccharide Vaccine: What You Need to Know  1. Why get vaccinated?  Vaccination can protect older adults (and some children and younger adults) from pneumococcal disease.  Pneumococcal disease is caused by bacteria that can spread from person to person through close contact. It can cause ear infections, and it can also lead to more serious infections of the:   · Lungs (pneumonia),  · Blood (bacteremia), and  · Covering of the brain and spinal cord (meningitis). Meningitis can cause deafness and brain damage, and it can be fatal.  Anyone can get pneumococcal disease, but children under 2 years of age, people with certain medical conditions, adults over 65 years of age, and cigarette smokers are at the highest risk.  About 18,000 older adults die each year from pneumococcal disease in the [place].  Treatment of pneumococcal infections with penicillin and other drugs used to be more effective. But some strains of the disease have become resistant to these drugs. This makes prevention of the disease, through vaccination, even more important.  2. Pneumococcal polysaccharide vaccine (PPSV23)  Pneumococcal polysaccharide vaccine (PPSV23) protects against 23 types of pneumococcal bacteria. It will not prevent all pneumococcal disease.  PPSV23 is recommended for:  · All adults 65 years of age and older,  · Anyone 2 through 71 years of age with certain long-term health problems,  · Anyone 2 through 71 years of age with a weakened immune system,  · Adults 19 through 71 years of age who smoke cigarettes or have asthma.  Most people need only one dose of PPSV. A second dose is recommended for certain high-risk groups. People 65 and older should get a dose even if they have gotten one or more doses of the vaccine before they turned 65.  Your healthcare provider can give you more information about these recommendations.  Most healthy adults develop protection within 2 to 3 weeks of getting the shot.  3. Some  people should not get this vaccine  · Anyone who has had a life-threatening allergic reaction to PPSV should not get another dose.  · Anyone who has a severe allergy to any component of PPSV should not receive it. Tell your provider if you have any severe allergies.  · Anyone who is moderately or severely ill when the shot is scheduled may be asked to wait until they recover before getting the vaccine. Someone with a mild illness can usually be vaccinated.  · Children less than 2 years of age should not receive this vaccine.  · There is no evidence that PPSV is harmful to either a pregnant woman or to her fetus. However, as a precaution, women who need the vaccine should be vaccinated before becoming pregnant, if possible.  4. Risks of a vaccine reaction  With any medicine, including vaccines, there is a chance of side effects. These are usually mild and go away on their own, but serious reactions are also possible.  About half of people who get PPSV have mild side effects, such as redness or pain where the shot is given, which go away within about two days.  Less than 1 out of 100 people develop a fever, muscle aches, or more severe local reactions.  Problems that could happen after any vaccine:  · People sometimes faint after a medical procedure, including vaccination. Sitting or lying down for about 15 minutes can help prevent fainting, and injuries caused by a fall. Tell your doctor if you feel dizzy, or have vision changes or   ringing in the ears.  · Some people get severe pain in the shoulder and have difficulty moving the arm where a shot was given. This happens very rarely.  · Any medication can cause a severe allergic reaction. Such reactions from a vaccine are very rare, estimated at about 1 in a million doses, and would happen within a few minutes to a few hours after the vaccination.  As with any medicine, there is a very remote chance of a vaccine causing a serious injury or death.  The safety of  vaccines is always being monitored. For more information, visit: www.cdc.gov/vaccinesafety/  5. What if there is a serious reaction?  What should I look for?  Look for anything that concerns you, such as signs of a severe allergic reaction, very high fever, or unusual behavior.   Signs of a severe allergic reaction can include hives, swelling of the face and throat, difficulty breathing, a fast heartbeat, dizziness, and weakness. These would usually start a few minutes to a few hours after the vaccination.  What should I do?  If you think it is a severe allergic reaction or other emergency that can't wait, call 9-1-1 or get to the nearest hospital. Otherwise, call your doctor.  Afterward, the reaction should be reported to the Vaccine Adverse Event Reporting System (VAERS). Your doctor might file this report, or you can do it yourself through the VAERS web site at www.vaers.hhs.gov, or by calling 1-800-822-7967.   VAERS does not give medical advice.  6. How can I learn more?  · Ask your doctor. He or she can give you the vaccine package insert or suggest other sources of information.  · Call your local or state health department.  · Contact the Centers for Disease Control and Prevention (CDC):    Call 1-800-232-4636 (1-800-CDC-INFO) or    Visit CDC's website at www.cdc.gov/vaccines  CDC Pneumococcal Polysaccharide Vaccine VIS (06/27/13)     This information is not intended to replace advice given to you by your health care provider. Make sure you discuss any questions you have with your health care provider.     Document Released: 12/18/2005 Document Revised: 03/13/2014 Document Reviewed: 06/30/2013  Elsevier Interactive Patient Education ©2016 Elsevier Inc.

## 2015-11-30 NOTE — Patient Instructions (Signed)
Pneumococcal Vaccine, Polyvalent suspension for injection What is this medicine? PNEUMOCOCCAL VACCINE (NEU mo KOK al vak SEEN) is a vaccine used to prevent pneumococcus bacterial infections. These bacteria can cause serious infections like pneumonia, meningitis, and blood infections. This vaccine will lower your chance of getting pneumonia. If you do get pneumonia, it can make your symptoms milder and your illness shorter. This vaccine will not treat an infection and will not cause infection. This vaccine is recommended for infants and young children, adults with certain medical conditions, and adults 65 years or older. This medicine may be used for other purposes; ask your health care provider or pharmacist if you have questions. What should I tell my health care provider before I take this medicine? They need to know if you have any of these conditions: -bleeding problems -fever -immune system problems -an unusual or allergic reaction to pneumococcal vaccine, diphtheria toxoid, other vaccines, latex, other medicines, foods, dyes, or preservatives -pregnant or trying to get pregnant -breast-feeding How should I use this medicine? This vaccine is for injection into a muscle. It is given by a health care professional. A copy of Vaccine Information Statements will be given before each vaccination. Read this sheet carefully each time. The sheet may change frequently. Talk to your pediatrician regarding the use of this medicine in children. While this drug may be prescribed for children as young as 71 weeks old for selected conditions, precautions do apply. Overdosage: If you think you have taken too much of this medicine contact a poison control center or emergency room at once. NOTE: This medicine is only for you. Do not share this medicine with others. What if I miss a dose? It is important not to miss your dose. Call your doctor or health care professional if you are unable to keep an  appointment. What may interact with this medicine? -medicines for cancer chemotherapy -medicines that suppress your immune function -steroid medicines like prednisone or cortisone This list may not describe all possible interactions. Give your health care provider a list of all the medicines, herbs, non-prescription drugs, or dietary supplements you use. Also tell them if you smoke, drink alcohol, or use illegal drugs. Some items may interact with your medicine. What should I watch for while using this medicine? Mild fever and pain should go away in 3 days or less. Report any unusual symptoms to your doctor or health care professional. What side effects may I notice from receiving this medicine? Side effects that you should report to your doctor or health care professional as soon as possible: -allergic reactions like skin rash, itching or hives, swelling of the face, lips, or tongue -breathing problems -confused -fast or irregular heartbeat -fever over 102 degrees F -seizures -unusual bleeding or bruising -unusual muscle weakness Side effects that usually do not require medical attention (report to your doctor or health care professional if they continue or are bothersome): -aches and pains -diarrhea -fever of 102 degrees F or less -headache -irritable -loss of appetite -pain, tender at site where injected -trouble sleeping This list may not describe all possible side effects. Call your doctor for medical advice about side effects. You may report side effects to FDA at 1-800-FDA-1088. Where should I keep my medicine? This does not apply. This vaccine is given in a clinic, pharmacy, doctor's office, or other health care setting and will not be stored at home. NOTE: This sheet is a summary. It may not cover all possible information. If you have questions about this  medicine, talk to your doctor, pharmacist, or health care provider.    2016, Elsevier/Gold Standard. (2013-11-27  10:27:27)

## 2015-12-03 ENCOUNTER — Encounter: Payer: Self-pay | Admitting: Neurology

## 2015-12-03 ENCOUNTER — Ambulatory Visit (INDEPENDENT_AMBULATORY_CARE_PROVIDER_SITE_OTHER): Payer: Medicare Other | Admitting: Neurology

## 2015-12-03 DIAGNOSIS — G250 Essential tremor: Secondary | ICD-10-CM | POA: Diagnosis not present

## 2015-12-03 NOTE — Progress Notes (Signed)
Reason for visit: Tremor  Referring physician: Dr. Traci Sermon MALAKYE NOLDEN is a 70 y.o. male  History of present illness:  Mr. Chiasson is a 70 year old right-handed white male with a history of an essential tremor that dates back greater than 10 years. The patient has been seen previously by Dr. Erling Cruz, and he was seen by myself about 3 years ago. The patient has had a head and neck tremor, he has had some tremor of the arms, when last seen he was a bit more prominent on the right arm than the left. The patient has over the last 6 months noted some increasing tremor of the left arm, he has had difficulty holding objects with the arm as this may heighten the tremor. If he is holding a plate it may result in spilling food on the floor. He denies that he has much difficulty with handwriting or feeding himself with the right hand. The patient has not noted any change in walking except that he has had to reduce the duration of his walks because of arthritis. He walks about 2-1/2 miles a day. He may occasionally stumble, he does not fall. He reports some generalized fatigue. He denies any numbness or weakness of extremities. The patient has had a brother who had Parkinson's disease, he is concerned about this. He comes to this office for an evaluation.  Past Medical History:  Diagnosis Date  . ADHD (attention deficit hyperactivity disorder)   . B12 deficiency   . BPH (benign prostatic hypertrophy)   . Chronic lymphocytic leukemia (CLL), T-cell ([place]) DX 1996--  ONCOLOGIST-  DR [facility]   PT IS ASYMPTOMATIC--- LAST CBC W/ DIFF 06-24-2012 STABLE  . Coronary artery disease CARDIOLOGIST- DR Daneen Schick   S/P STENTING LAD 1999  . Crohn's disease of ileum ([place]) SINCE 1988  . ED (erectile dysfunction)   . Elevated PSA   . Essential and other specified forms of tremor 11/25/2012  . H/O adenomatous polyp of colon   . Hyperlipidemia   . Nocturia   . OA (osteoarthritis)   . Peripheral neuropathy (HCC)      hx of, none current as of 08-04-13  . Peyronie disease   . S/P coronary artery stent placement OCT 1999 OF LAD  . Tremor, hereditary, benign MILD RIGHT HAND    Past Surgical History:  Procedure Laterality Date  . CARDIOVASCULAR STRESS TEST  11-01-2010 DR Daneen Schick   NORMAL PERFUSION STUDY/ EF 64%/ NO ISCHEMIA  . COLONOSCOPY WITH PROPOFOL N/A 09/24/2012   Procedure: COLONOSCOPY WITH PROPOFOL;  Surgeon: [name], MD;  Location: [facility];  Service: Endoscopy;  Laterality: N/A;  . CORONARY ANGIOPLASTY WITH STENT PLACEMENT  OCT 1999   STENT OF LAD  . LAPAROSCOPIC INGUINAL HERNIA REPAIR Bilateral 01-10-2004   W/ MESH  . neck benign removed from neck  yrs ago  . PROSTATE BIOPSY N/A 07/12/2012   Procedure: PROSTATE BIOPSY AND ULTRASOUND;  Surgeon: Ailene Rud, MD;  Location: [facility];  Service: Urology;  Laterality: N/A;  . PROSTATE SURGERY  2002   tuna  . REMOVAL LEFT NECK LYMPH NODE  1996  . TONSILLECTOMY  CHILD  . TRANSURETHRAL RESECTION OF PROSTATE N/A 08/08/2013   Procedure: TRANSURETHRAL RESECTION OF THE PROSTATE WITH GYRUS INSTRUMENTS;  Surgeon: Ailene Rud, MD;  Location: [facility];  Service: Urology;  Laterality: N/A;  . TRANSURETHRAL RESECTION OF PROSTATE      Family History  Problem Relation Age of Onset  .  Obesity Brother   . Diabetes Brother   . Parkinsonism Brother     Social history:  reports that he quit smoking about 31 years ago. His smoking use included Cigarettes. He has a 10.00 pack-year smoking history. He has never used smokeless tobacco. He reports that he drinks about 1.0 oz of alcohol per week . He reports that he does not use drugs.  Medications:  Prior to Admission medications   Medication Sig Start Date End Date Taking? Authorizing Provider  amLODipine (NORVASC) 2.5 MG tablet Take 1 tablet (2.5 mg total) by mouth daily. Please call and schedule a one year follow up appointment 08/23/15  Yes Belva Crome, MD   amphetamine-dextroamphetamine (ADDERALL) 5 MG tablet Take 5 mg by mouth 2 (two) times daily.   Yes Historical Provider, MD  aspirin EC 81 MG tablet Take 81 mg by mouth daily.   Yes Historical Provider, MD  atorvastatin (LIPITOR) 20 MG tablet Take 1 tablet by mouth  every morning 11/22/15  Yes Belva Crome, MD  Celecoxib (CELEBREX PO) Take 1 capsule by mouth daily.   Yes Historical Provider, MD  Cyanocobalamin 2500 MCG CHEW Chew by mouth.   Yes Historical Provider, MD  omeprazole (PRILOSEC) 20 MG capsule Take 20 mg by mouth daily.   Yes Historical Provider, MD     No Known Allergies  ROS:  Out of a complete 14 system review of symptoms, the patient complains only of the following symptoms, and all other reviewed systems are negative.  Fatigue Itching Impotence following TURP Joint pain, muscle cramps, aching muscles Tremor Not enough sleep  Blood pressure (!) 159/87, pulse 82, height 5' 8"  (1.727 m), weight 192 lb 8 oz (87.3 kg).  Physical Exam  General: The patient is alert and cooperative at the time of the examination. The patient is minimally obese.  Eyes: Pupils are equal, round, and reactive to light. Discs are flat bilaterally.  Neck: The neck is supple, no carotid bruits are noted.  Respiratory: The respiratory examination is clear.  Cardiovascular: The cardiovascular examination reveals a regular rate and rhythm, no obvious murmurs or rubs are noted.  Skin: Extremities are without significant edema.  Neurologic Exam  Mental status: The patient is alert and oriented x 3 at the time of the examination. The patient has apparent normal recent and remote memory, with an apparently normal attention span and concentration ability.  Cranial nerves: Facial symmetry is present. There is good sensation of the face to pinprick and soft touch bilaterally. The strength of the facial muscles and the muscles to head turning and shoulder shrug are normal bilaterally. Speech is well  enunciated, no aphasia or dysarthria is noted. Extraocular movements are full. Visual fields are full. The tongue is midline, and the patient has symmetric elevation of the soft palate. No obvious hearing deficits are noted.  Motor: The motor testing reveals 5 over 5 strength of all 4 extremities. Good symmetric motor tone is noted throughout.  Sensory: Sensory testing is intact to pinprick, soft touch, vibration sensation, and position sense on all 4 extremities. No evidence of extinction is noted.  Coordination: Cerebellar testing reveals good finger-nose-finger and heel-to-shin bilaterally. A resting tremor seen with the left upper extremity, with walking, the tremor is noted on the left arm.  Gait and station: Gait is normal. Tandem gait is normal. Romberg is negative. No drift is seen. Good arm swing seen bilaterally with walking.  Reflexes: Deep tendon reflexes are symmetric and normal bilaterally. Toes  are downgoing bilaterally.   Assessment/Plan:  1. Left upper extremity tremor, resting tremor  2. Essential tremor  This patient has developed a new tremor involving the left upper extremity that is a resting tremor, this was not present 3 years ago. The patient has had symptoms of the left arm tremor over the last 6 months. There are no other symptoms of parkinsonism at this time. The patient will need to be followed for this issue, he will follow-up in 6 months. The patient will contact me if there are any other changes in his functional levels.  Jill Alexanders MD 12/03/2015 10:13 AM  [facility] [address] [place], [place] [address]  Phone [phone] Fax [phone]

## 2015-12-14 ENCOUNTER — Encounter: Payer: Self-pay | Admitting: Interventional Cardiology

## 2015-12-14 ENCOUNTER — Ambulatory Visit (INDEPENDENT_AMBULATORY_CARE_PROVIDER_SITE_OTHER): Payer: Medicare Other | Admitting: Interventional Cardiology

## 2015-12-14 ENCOUNTER — Other Ambulatory Visit: Payer: Medicare Other

## 2015-12-14 VITALS — BP 136/82 | HR 74 | Ht 68.0 in | Wt 190.4 lb

## 2015-12-14 DIAGNOSIS — I739 Peripheral vascular disease, unspecified: Secondary | ICD-10-CM

## 2015-12-14 DIAGNOSIS — I10 Essential (primary) hypertension: Secondary | ICD-10-CM | POA: Diagnosis not present

## 2015-12-14 DIAGNOSIS — I2510 Atherosclerotic heart disease of native coronary artery without angina pectoris: Secondary | ICD-10-CM | POA: Diagnosis not present

## 2015-12-14 DIAGNOSIS — E782 Mixed hyperlipidemia: Secondary | ICD-10-CM

## 2015-12-14 LAB — HEPATIC FUNCTION PANEL
ALT: 14 U/L (ref 9–46)
AST: 19 U/L (ref 10–35)
Albumin: 4.1 g/dL (ref 3.6–5.1)
Alkaline Phosphatase: 73 U/L (ref 40–115)
Bilirubin, Direct: 0.2 mg/dL (ref <=0.2)
Indirect Bilirubin: 0.6 mg/dL (ref 0.2–1.2)
Total Bilirubin: 0.8 mg/dL (ref 0.2–1.2)
Total Protein: 6.2 g/dL (ref 6.1–8.1)

## 2015-12-14 LAB — LIPID PANEL
Cholesterol: 154 mg/dL (ref 125–200)
HDL: 47 mg/dL (ref >=40)
LDL Cholesterol: 83 mg/dL (ref <130)
Total CHOL/HDL Ratio: 3.3 Ratio (ref <=5.0)
Triglycerides: 118 mg/dL (ref <150)
VLDL: 24 mg/dL (ref <30)

## 2015-12-14 NOTE — Progress Notes (Signed)
Cardiology Office Note    Date:  12/14/2015   ID:  Cole Martinez, DOB [birth year], MRN [id number]  PCP:  [name], MD  Cardiologist: Sinclair Grooms, MD   Chief Complaint  Patient presents with  . Coronary Artery Disease    History of Present Illness:  Cole Martinez is a 70 y.o. male who presents for CAD with bare-metal stent in LAD, hyperlipidemia, elevated BP but not diagnosed hypertension, and obesity.  Doing well with the exception of exertional bilateral lower extremity pain in the calves and posterior aspect of the thigh. Denies orthopnea, PND, syncope, and palpitations. Overall doing well.   Past Medical History:  Diagnosis Date  . ADHD (attention deficit hyperactivity disorder)   . B12 deficiency   . BPH (benign prostatic hypertrophy)   . 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 Coronary artery disease CARDIOLOGIST- DR Daneen Schick   S/P STENTING LAD 1999  . Crohn's disease of ileum ([place]) SINCE 1988  . ED (erectile dysfunction)   . Elevated PSA   . Essential and other specified forms of tremor 11/25/2012  . H/O adenomatous polyp of colon   . Hyperlipidemia   . Nocturia   . OA (osteoarthritis)   . Peripheral neuropathy (HCC)    hx of, none current as of 08-04-13  . Peyronie disease   . S/P coronary artery stent placement OCT 1999 OF LAD  . Tremor, hereditary, benign MILD RIGHT HAND    Past Surgical History:  Procedure Laterality Date  . CARDIOVASCULAR STRESS TEST  11-01-2010 DR Daneen Schick   NORMAL PERFUSION STUDY/ EF 64%/ NO ISCHEMIA  . COLONOSCOPY WITH PROPOFOL N/A 09/24/2012   Procedure: COLONOSCOPY WITH PROPOFOL;  Surgeon: [name], MD;  Location[facility];  Service: Endoscopy;  Laterality: N/A;  . CORONARY ANGIOPLASTY WITH STENT PLACEMENT  OCT 1999   STENT OF LAD  . LAPAROSCOPIC INGUINAL HERNIA REPAIR Bilateral 01-10-2004   W/  MESH  . neck benign removed from neck  yrs ago  . PROSTATE BIOPSY N/A 07/12/2012   Procedure: PROSTATE BIOPSY AND ULTRASOUND;  Surgeon: Ailene Rud, MD;  Location: [facility];  Service: Urology;  Laterality: N/A;  . PROSTATE SURGERY  2002   tuna  . REMOVAL LEFT NECK LYMPH NODE  1996  . TONSILLECTOMY  CHILD  . TRANSURETHRAL RESECTION OF PROSTATE N/A 08/08/2013   Procedure: TRANSURETHRAL RESECTION OF THE PROSTATE WITH GYRUS INSTRUMENTS;  Surgeon: Ailene Rud, MD;  [facility];  Service: Urology;  Laterality: N/A;  . TRANSURETHRAL RESECTION OF PROSTATE      Current Medications: Outpatient Medications Prior to Visit  Medication Sig Dispense Refill  . amLODipine (NORVASC) 2.5 MG tablet Take 1 tablet (2.5 mg total) by mouth daily. Please call and schedule a one year follow up appointment 90 tablet 0  . amphetamine-dextroamphetamine (ADDERALL) 5 MG tablet Take 5 mg by mouth 2 (two) times daily.    Marland Kitchen aspirin EC 81 MG tablet Take 81 mg by mouth daily.    Marland Kitchen atorvastatin (LIPITOR) 20 MG tablet Take 1 tablet by mouth  every morning 90 tablet 0  . Cyanocobalamin 2500 MCG CHEW Chew 1 tablet by mouth daily.     Marland Kitchen omeprazole (PRILOSEC) 20 MG capsule Take 20 mg by mouth daily.    . Celecoxib (CELEBREX PO) Take 1 capsule by mouth daily.  No facility-administered medications prior to visit.      Allergies:   Review of patient's allergies indicates no known allergies.   Social History   Social History  . Marital status: Married    Spouse name: N/A  . Number of children: N/A  . Years of education: N/A   Social History Main Topics  . Smoking status: Former Smoker    Packs/day: 1.00    Years: 10.00    Types: Cigarettes    Quit date: 07/09/1984  . Smokeless tobacco: Never Used  . Alcohol use 1.0 oz/week    2 Standard drinks or equivalent per week     Comment: daily 1 per day  . Drug use: No  . Sexual activity: Not Asked   Other Topics Concern  . None    Social History Narrative   Lives at home with is wife, Cole Martinez.  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  Has BBA.  Has 3 children.    Caffeine 2 cups coffee.       Family History:  The patient's family history includes Diabetes in his brother; Obesity in his brother; Parkinsonism in his brother.   ROS:   Please see the history of present illness.    Leg swelling, shortness of breath, easy bruising, skipped heartbeats or rare  All other systems reviewed and are negative.   PHYSICAL EXAM:   VS:  BP 136/82   Pulse 74   Ht 5' 8"  (1.727 m)   Wt 190 lb 6.4 oz (86.4 kg)   BMI 28.95 kg/m    GEN: Well nourished, well developed, in no acute distress  HEENT: normal  Neck: no JVD, carotid bruits, or masses Cardiac: RRR; no murmurs, rubs, or gallops,no edema  Respiratory:  clear to auscultation bilaterally, normal work of breathing GI: soft, nontender, nondistended, + BS MS: no deformity or atrophy  Skin: warm and dry, no rash Neuro:  Alert and Oriented x 3, Strength and sensation are intact Psych: euthymic mood, full affect  Wt Readings from Last 3 Encounters:  12/14/15 190 lb 6.4 oz (86.4 kg)  12/03/15 192 lb 8 oz (87.3 kg)  11/23/15 190 lb 3.2 oz (86.3 kg)      Studies/Labs Reviewed:   EKG:  EKG  Normal sinus rhythm and normal appearance.  Recent Labs: 11/23/2015: HGB 14.6; Platelets 227   Lipid Panel    Component Value Date/Time   CHOL 138 10/22/2014 0806   TRIG 111.0 10/22/2014 0806   HDL 42.50 10/22/2014 0806   CHOLHDL 3 10/22/2014 0806   VLDL 22.2 10/22/2014 0806   LDLCALC 74 10/22/2014 0806    Additional studies/ records that were reviewed today include:   Stress myocardial perfusion study 12/2013: Overall Impression:  Low risk stress nuclear study with abnormal ECG portion. Normal perfusion with no evidence of ischemia or prior infarct. Prior scan with abnormal stress ECG..  LV Ejection Fraction: 50%.  LV Wall Motion:  NL LV Function; NL Wall Motion   ASSESSMENT:     1. Coronary artery disease involving native coronary artery of native heart without angina pectoris   2. Essential hypertension   3. Mixed hyperlipidemia   4. Bilateral claudication of lower limb (HCC)      PLAN:  In order of problems listed above:  1. Asymptomatic, with most recent stress study 2 years ago negative for ischemia. 2. 2 g sodium diet as advocated. Target blood pressure 130/85 mmHg last period. 3. Liver and lipid panel will be obtained today. Target  LDL 70 or less. 4. Bilateral lower extremity arterial Doppler to rule out claudication/obstructive disease. My suspicion listening to the history is that this could be neurogenic.    Medication Adjustments/Labs and Tests Ordered: Current medicines are reviewed at length with the patient today.  Concerns regarding medicines are outlined above.  Medication changes, Labs and Tests ordered today are listed in the Patient Instructions below. There are no Patient Instructions on file for this visit.   Signed, Sinclair Grooms, MD  12/14/2015 8:37 AM    [facility] [address], [place], [place]  [address] Phone: [phone]; Fax: [phone]

## 2015-12-14 NOTE — Patient Instructions (Signed)
Medication Instructions:  None  Labwork: None  Testing/Procedures: Your physician has requested that you have a lower extremity arterial duplex. This test is an ultrasound of the arteries in the legs or arms. It looks at arterial blood flow in the legs and arms. Allow one hour for Lower and Upper Arterial scans. There are no restrictions or special instructions    Follow-Up: Your physician wants you to follow-up in: 1 year with Dr. Tamala Julian. You will receive a reminder letter in the mail two months in advance. If you don't receive a letter, please call our office to schedule the follow-up appointment.   Any Other Special Instructions Will Be Listed Below (If Applicable).     If you need a refill on your cardiac medications before your next appointment, please call your pharmacy.

## 2015-12-15 ENCOUNTER — Other Ambulatory Visit: Payer: Self-pay | Admitting: Interventional Cardiology

## 2015-12-15 DIAGNOSIS — I739 Peripheral vascular disease, unspecified: Secondary | ICD-10-CM

## 2015-12-17 ENCOUNTER — Other Ambulatory Visit: Payer: Self-pay | Admitting: Interventional Cardiology

## 2015-12-31 ENCOUNTER — Encounter (HOSPITAL_COMMUNITY): Payer: Medicare Other

## 2016-01-05 ENCOUNTER — Ambulatory Visit (HOSPITAL_COMMUNITY)
Admission: RE | Admit: 2016-01-05 | Discharge: 2016-01-05 | Disposition: A | Discharge status: Home / Self Care | Payer: Medicare Other | Source: Ambulatory Visit | Attending: Interventional Cardiology | Admitting: Interventional Cardiology

## 2016-01-05 DIAGNOSIS — I739 Peripheral vascular disease, unspecified: Secondary | ICD-10-CM | POA: Diagnosis not present

## 2016-03-09 ENCOUNTER — Other Ambulatory Visit: Payer: Self-pay | Admitting: Interventional Cardiology

## 2016-03-09 DIAGNOSIS — M10071 Idiopathic gout, right ankle and foot: Secondary | ICD-10-CM | POA: Diagnosis not present

## 2016-04-11 DIAGNOSIS — N486 Induration penis plastica: Secondary | ICD-10-CM | POA: Diagnosis not present

## 2016-04-11 DIAGNOSIS — M40204 Unspecified kyphosis, thoracic region: Secondary | ICD-10-CM | POA: Diagnosis not present

## 2016-04-11 DIAGNOSIS — R6882 Decreased libido: Secondary | ICD-10-CM | POA: Diagnosis not present

## 2016-04-11 DIAGNOSIS — R29890 Loss of height: Secondary | ICD-10-CM | POA: Diagnosis not present

## 2016-04-11 DIAGNOSIS — E538 Deficiency of other specified B group vitamins: Secondary | ICD-10-CM | POA: Diagnosis not present

## 2016-04-11 DIAGNOSIS — Z125 Encounter for screening for malignant neoplasm of prostate: Secondary | ICD-10-CM | POA: Diagnosis not present

## 2016-04-11 DIAGNOSIS — D751 Secondary polycythemia: Secondary | ICD-10-CM | POA: Diagnosis not present

## 2016-04-11 DIAGNOSIS — R5383 Other fatigue: Secondary | ICD-10-CM | POA: Diagnosis not present

## 2016-04-11 DIAGNOSIS — Z833 Family history of diabetes mellitus: Secondary | ICD-10-CM | POA: Diagnosis not present

## 2016-04-11 DIAGNOSIS — N529 Male erectile dysfunction, unspecified: Secondary | ICD-10-CM | POA: Diagnosis not present

## 2016-04-11 DIAGNOSIS — E291 Testicular hypofunction: Secondary | ICD-10-CM | POA: Diagnosis not present

## 2016-04-12 DIAGNOSIS — E291 Testicular hypofunction: Secondary | ICD-10-CM | POA: Diagnosis not present

## 2016-04-12 DIAGNOSIS — D751 Secondary polycythemia: Secondary | ICD-10-CM | POA: Diagnosis not present

## 2016-04-12 DIAGNOSIS — R6882 Decreased libido: Secondary | ICD-10-CM | POA: Diagnosis not present

## 2016-04-12 DIAGNOSIS — E538 Deficiency of other specified B group vitamins: Secondary | ICD-10-CM | POA: Diagnosis not present

## 2016-04-12 DIAGNOSIS — R5383 Other fatigue: Secondary | ICD-10-CM | POA: Diagnosis not present

## 2016-04-12 DIAGNOSIS — Z125 Encounter for screening for malignant neoplasm of prostate: Secondary | ICD-10-CM | POA: Diagnosis not present

## 2016-04-12 DIAGNOSIS — Z833 Family history of diabetes mellitus: Secondary | ICD-10-CM | POA: Diagnosis not present

## 2016-05-11 DIAGNOSIS — M8588 Other specified disorders of bone density and structure, other site: Secondary | ICD-10-CM | POA: Diagnosis not present

## 2016-05-16 DIAGNOSIS — M40204 Unspecified kyphosis, thoracic region: Secondary | ICD-10-CM | POA: Diagnosis not present

## 2016-05-16 DIAGNOSIS — Z125 Encounter for screening for malignant neoplasm of prostate: Secondary | ICD-10-CM | POA: Diagnosis not present

## 2016-05-16 DIAGNOSIS — Z6830 Body mass index (BMI) 30.0-30.9, adult: Secondary | ICD-10-CM | POA: Diagnosis not present

## 2016-05-16 DIAGNOSIS — N486 Induration penis plastica: Secondary | ICD-10-CM | POA: Diagnosis not present

## 2016-05-16 DIAGNOSIS — M810 Age-related osteoporosis without current pathological fracture: Secondary | ICD-10-CM | POA: Diagnosis not present

## 2016-05-16 DIAGNOSIS — R5383 Other fatigue: Secondary | ICD-10-CM | POA: Diagnosis not present

## 2016-05-16 DIAGNOSIS — N529 Male erectile dysfunction, unspecified: Secondary | ICD-10-CM | POA: Diagnosis not present

## 2016-05-16 DIAGNOSIS — E230 Hypopituitarism: Secondary | ICD-10-CM | POA: Diagnosis not present

## 2016-05-16 DIAGNOSIS — R6882 Decreased libido: Secondary | ICD-10-CM | POA: Diagnosis not present

## 2016-05-16 DIAGNOSIS — D751 Secondary polycythemia: Secondary | ICD-10-CM | POA: Diagnosis not present

## 2016-05-16 DIAGNOSIS — E669 Obesity, unspecified: Secondary | ICD-10-CM | POA: Diagnosis not present

## 2016-05-16 DIAGNOSIS — R29890 Loss of height: Secondary | ICD-10-CM | POA: Diagnosis not present

## 2016-05-25 DIAGNOSIS — M25561 Pain in right knee: Secondary | ICD-10-CM | POA: Diagnosis not present

## 2016-06-14 DIAGNOSIS — C9110 Chronic lymphocytic leukemia of B-cell type not having achieved remission: Secondary | ICD-10-CM | POA: Diagnosis not present

## 2016-06-14 DIAGNOSIS — H900 Conductive hearing loss, bilateral: Secondary | ICD-10-CM | POA: Diagnosis not present

## 2016-06-14 DIAGNOSIS — H6123 Impacted cerumen, bilateral: Secondary | ICD-10-CM | POA: Diagnosis not present

## 2016-06-14 DIAGNOSIS — J029 Acute pharyngitis, unspecified: Secondary | ICD-10-CM | POA: Diagnosis not present

## 2016-06-14 DIAGNOSIS — J0100 Acute maxillary sinusitis, unspecified: Secondary | ICD-10-CM | POA: Diagnosis not present

## 2016-07-10 DIAGNOSIS — H6123 Impacted cerumen, bilateral: Secondary | ICD-10-CM | POA: Diagnosis not present

## 2016-07-10 DIAGNOSIS — H903 Sensorineural hearing loss, bilateral: Secondary | ICD-10-CM | POA: Diagnosis not present

## 2016-07-12 ENCOUNTER — Ambulatory Visit (INDEPENDENT_AMBULATORY_CARE_PROVIDER_SITE_OTHER): Payer: Medicare Other | Admitting: Neurology

## 2016-07-12 ENCOUNTER — Encounter: Payer: Self-pay | Admitting: Neurology

## 2016-07-12 VITALS — BP 126/77 | HR 88 | Ht 68.0 in | Wt 193.0 lb

## 2016-07-12 DIAGNOSIS — M1711 Unilateral primary osteoarthritis, right knee: Secondary | ICD-10-CM | POA: Diagnosis not present

## 2016-07-12 DIAGNOSIS — G250 Essential tremor: Secondary | ICD-10-CM

## 2016-07-12 NOTE — Progress Notes (Signed)
Reason for visit: Tremor  Cole Martinez is an 71 y.o. male  History of present illness:  Cole Martinez is a 71 year old right-handed white male with a history of essential tremor affecting the left greater right upper extremities. The patient has a true resting tremor on the left, he has not developed any parkinsonian features. He believes the tremor is getting slightly worse, he is having trouble holding something in his left hand secondary to the tremor. He has good days and bad days with tremor. If he gets upset or nervous about something, tremor is worse. He does not wish to go on any medications for the tremor. He more recently has had some significant discomfort in the right knee, he is followed by Dr. Noemi Chapel for this, they have recommended a total knee replacement. The patient wishes to have a second opinion regarding this issue. The patient has had some balance issues, he has fallen on one occasion since last seen.  Past Medical History:  Diagnosis Date  . ADHD (attention deficit hyperactivity disorder)   . B12 deficiency   . BPH (benign prostatic hypertrophy)   . 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 Coronary artery disease CARDIOLOGIST- DR Daneen Schick   S/P STENTING LAD 1999  . Crohn's disease of ileum ([place]) SINCE 1988  . ED (erectile dysfunction)   . Elevated PSA   . Essential and other specified forms of tremor 11/25/2012  . H/O adenomatous polyp of colon   . Hyperlipidemia   . Nocturia   . OA (osteoarthritis)   . Peripheral neuropathy    hx of, none current as of 08-04-13  . Peyronie disease   . S/P coronary artery stent placement OCT 1999 OF LAD  . Tremor, hereditary, benign MILD RIGHT HAND    Past Surgical History:  Procedure Laterality Date  . CARDIOVASCULAR STRESS TEST  11-01-2010 DR Daneen Schick   NORMAL PERFUSION STUDY/ EF 64%/ NO ISCHEMIA  . COLONOSCOPY  WITH PROPOFOL N/A 09/24/2012   Procedure: COLONOSCOPY WITH PROPOFOL;  Surgeon: [name], MD;  [facility];  Service: Endoscopy;  Laterality: N/A;  . CORONARY ANGIOPLASTY WITH STENT PLACEMENT  OCT 1999   STENT OF LAD  . LAPAROSCOPIC INGUINAL HERNIA REPAIR Bilateral 01-10-2004   W/ MESH  . neck benign removed from neck  yrs ago  . PROSTATE BIOPSY N/A 07/12/2012   Procedure: PROSTATE BIOPSY AND ULTRASOUND;  Surgeon: Ailene Rud, MD;  Location: [facility];  Service: Urology;  Laterality: N/A;  . PROSTATE SURGERY  2002   tuna  . REMOVAL LEFT NECK LYMPH NODE  1996  . TONSILLECTOMY  CHILD  . TRANSURETHRAL RESECTION OF PROSTATE N/A 08/08/2013   Procedure: TRANSURETHRAL RESECTION OF THE PROSTATE WITH GYRUS INSTRUMENTS;  Surgeon: Ailene Rud, MD;  [facility];  Service: Urology;  Laterality: N/A;  . TRANSURETHRAL RESECTION OF PROSTATE      Family History  Problem Relation Age of Onset  . Obesity Brother   . Diabetes Brother   . Parkinsonism Brother     Social history:  reports that he quit smoking about 32 years ago. His smoking use included Cigarettes. He has a 10.00 pack-year smoking history. He has never used smokeless tobacco. He reports that he drinks about 1.0 oz of alcohol per week . He reports that he does not use drugs.   No  Known Allergies  Medications:  Prior to Admission medications   Medication Sig Start Date End Date Taking? Authorizing Provider  amLODipine (NORVASC) 2.5 MG tablet Take 1 tablet (2.5 mg total) by mouth daily. 12/17/15  Yes Belva Crome, MD  amphetamine-dextroamphetamine (ADDERALL) 5 MG tablet Take 5 mg by mouth 2 (two) times daily.   Yes [provider]  aspirin EC 81 MG tablet Take 81 mg by mouth daily.   Yes [provider]  atorvastatin (LIPITOR) 20 MG tablet TAKE 1 TABLET BY MOUTH  EVERY MORNING 03/09/16  Yes Belva Crome, MD  celecoxib (CELEBREX) 200 MG capsule Take 200 mg by mouth  daily.   Yes [provider]  Cyanocobalamin 2500 MCG CHEW Chew 1 tablet by mouth daily.    Yes [provider]  omeprazole (PRILOSEC) 20 MG capsule Take 20 mg by mouth daily.   Yes [provider]  VITAMIN E PO Take 1 Dose by mouth once a week.   Yes [provider]    ROS:  Out of a complete 14 system review of symptoms, the patient complains only of the following symptoms, and all other reviewed systems are negative.  Runny nose Frequency of urination Joint pain, back pain, muscle cramps Restless legs Itching  Blood pressure 126/77, pulse 88, height 5' 8"  (1.727 m), weight 193 lb (87.5 kg).  Physical Exam  General: The patient is alert and cooperative at the time of the examination.  Skin: No significant peripheral edema is noted.   Neurologic Exam  Mental status: The patient is alert and oriented x 3 at the time of the examination. The patient has apparent normal recent and remote memory, with an apparently normal attention span and concentration ability.   Cranial nerves: Facial symmetry is present. Speech is normal, no aphasia or dysarthria is noted. Extraocular movements are full. Visual fields are full.  Motor: The patient has good strength in all 4 extremities.  Sensory examination: Soft touch sensation is symmetric on the face, arms, and legs.  Coordination: The patient has good finger-nose-finger and heel-to-shin bilaterally. The patient has a resting tremor affecting the left hand, he does have a tremor involving the right hand as well, the left hand tremor is seen while walking. The patient is able to draw a spiral without significant translated tremor into the handwriting.  Gait and station: The patient has a normal gait. The patient has good arm swing, but there is a tremor while walking involving the left hand. Tandem gait is normal. Romberg is negative. No drift is seen.  Reflexes: Deep tendon reflexes are  symmetric.   Assessment/Plan:  1. Essential tremor  2. Right knee pain  The patient will be set up for a second opinion through Dr. Elmyra Ricks regarding his right knee surgery. The patient does not wish to go on any medications for his tremor at this time. A glass of wine prior to dinner may help him through the meal. The patient will follow-up in 8 or 9 months.  Jill Alexanders MD 07/12/2016 10:14 AM  [facility] [address] [place], [place] [address]  Phone [phone] Fax [phone]

## 2016-07-19 DIAGNOSIS — S6010XA Contusion of unspecified finger with damage to nail, initial encounter: Secondary | ICD-10-CM | POA: Diagnosis not present

## 2016-07-19 DIAGNOSIS — L0291 Cutaneous abscess, unspecified: Secondary | ICD-10-CM | POA: Diagnosis not present

## 2016-07-24 DIAGNOSIS — H25013 Cortical age-related cataract, bilateral: Secondary | ICD-10-CM | POA: Diagnosis not present

## 2016-07-24 DIAGNOSIS — H2513 Age-related nuclear cataract, bilateral: Secondary | ICD-10-CM | POA: Diagnosis not present

## 2016-08-07 DIAGNOSIS — M8580 Other specified disorders of bone density and structure, unspecified site: Secondary | ICD-10-CM | POA: Diagnosis not present

## 2016-08-07 DIAGNOSIS — E559 Vitamin D deficiency, unspecified: Secondary | ICD-10-CM | POA: Diagnosis not present

## 2016-08-10 ENCOUNTER — Other Ambulatory Visit: Payer: Self-pay | Admitting: Interventional Cardiology

## 2016-08-17 DIAGNOSIS — Z0000 Encounter for general adult medical examination without abnormal findings: Secondary | ICD-10-CM | POA: Diagnosis not present

## 2016-08-17 DIAGNOSIS — Z1389 Encounter for screening for other disorder: Secondary | ICD-10-CM | POA: Diagnosis not present

## 2016-08-30 ENCOUNTER — Ambulatory Visit: Payer: Medicare Other | Attending: Internal Medicine

## 2016-08-30 DIAGNOSIS — M545 Low back pain: Secondary | ICD-10-CM | POA: Insufficient documentation

## 2016-08-30 DIAGNOSIS — G8929 Other chronic pain: Secondary | ICD-10-CM | POA: Insufficient documentation

## 2016-08-30 DIAGNOSIS — R293 Abnormal posture: Secondary | ICD-10-CM | POA: Diagnosis not present

## 2016-08-30 DIAGNOSIS — M6281 Muscle weakness (generalized): Secondary | ICD-10-CM | POA: Diagnosis not present

## 2016-08-30 NOTE — Therapy (Signed)
[facility] [address] [place], [place], [address] Phone: [phone]   Fax:  [phone]  Physical Therapy Evaluation  Patient Details  Name: Cole Martinez MRN: [id number] Date of Birth: 1945/09/17 Referring Provider: Delrae Rend, MD   Encounter Date: 08/30/2016      PT End of Session - 08/30/16 1313    Visit Number 1   Number of Visits 10   Date for PT Re-Evaluation 10/25/16   PT Start Time 1230   PT Stop Time 1313   PT Time Calculation (min) 43 min   Activity Tolerance Patient tolerated treatment well   Behavior During Therapy [facility] for tasks assessed/performed      Past Medical History:  Diagnosis Date  . ADHD (attention deficit hyperactivity disorder)   . B12 deficiency   . BPH (benign prostatic hypertrophy)   . 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 Coronary artery disease CARDIOLOGIST- DR Daneen Schick   S/P STENTING LAD 1999  . Crohn's disease of ileum ([place]) SINCE 1988  . ED (erectile dysfunction)   . Elevated PSA   . Essential and other specified forms of tremor 11/25/2012  . H/O adenomatous polyp of colon   . Hyperlipidemia   . Nocturia   . OA (osteoarthritis)   . Peripheral neuropathy    hx of, none current as of 08-04-13  . Peyronie disease   . S/P coronary artery stent placement OCT 1999 OF LAD  . Tremor, hereditary, benign MILD RIGHT HAND    Past Surgical History:  Procedure Laterality Date  . CARDIOVASCULAR STRESS TEST  11-01-2010 DR Daneen Schick   NORMAL PERFUSION STUDY/ EF 64%/ NO ISCHEMIA  . COLONOSCOPY WITH PROPOFOL N/A 09/24/2012   Procedure: COLONOSCOPY WITH PROPOFOL;  Surgeon: [name], MD;  [facility];  Service: Endoscopy;  Laterality: N/A;  . CORONARY ANGIOPLASTY WITH STENT PLACEMENT  OCT 1999   STENT OF LAD  . LAPAROSCOPIC INGUINAL HERNIA REPAIR  Bilateral 01-10-2004   W/ MESH  . neck benign removed from neck  yrs ago  . PROSTATE BIOPSY N/A 07/12/2012   Procedure: PROSTATE BIOPSY AND ULTRASOUND;  Surgeon: Ailene Rud, MD;  Location: [facility];  Service: Urology;  Laterality: N/A;  . PROSTATE SURGERY  2002   tuna  . REMOVAL LEFT NECK LYMPH NODE  1996  . TONSILLECTOMY  CHILD  . TRANSURETHRAL RESECTION OF PROSTATE N/A 08/08/2013   Procedure: TRANSURETHRAL RESECTION OF THE PROSTATE WITH GYRUS INSTRUMENTS;  Surgeon: Ailene Rud, MD;  [facility];  Service: Urology;  Laterality: N/A;  . TRANSURETHRAL RESECTION OF PROSTATE      There were no vitals filed for this visit.       Subjective Assessment - 08/30/16 1234    Subjective Pt presents to PT with complaints of chronic LBP and was recently diagnosed with osteoporosis (t score -1.5).     Pertinent History osteoporosis: t score -1.5, considering Rt knee replacement   How long can you stand comfortably? limited by knee pain   How long can you walk comfortably? limited by knee pain   Diagnostic tests OA in Rt hip, no imaging of low back.  Osteoporosis    Patient Stated Goals return to walking, improve posture   Currently in Pain? Yes   Pain Score 2    Pain Location Back   Pain  Orientation Lower;Left   Pain Descriptors / Indicators Aching;Sharp   Pain Type Chronic pain   Pain Onset More than a month ago   Aggravating Factors  standing and walking   Pain Relieving Factors Celebrex, Advil            [facility] PT Assessment - 08/30/16 0001      Assessment   Medical Diagnosis low bone mass, osteoporosis   Referring Provider Delrae Rend, MD    Onset Date/Surgical Date 06/30/16   Next MD Visit 4 months     Precautions   Precautions Other (comment)   Precaution Comments osteoporosis     Restrictions   Weight Bearing Restrictions No     Balance Screen   Has the patient fallen in the past 6 months Yes   How many times? 2  PT will  address   Has the patient had a decrease in activity level because of a fear of falling?  No   Is the patient reluctant to leave their home because of a fear of falling?  No     Home Ecologist residence   Living Arrangements Spouse/significant other   Type of [place] Two level     Prior Function   Level of Independence Independent   Vocation Part time employment   Environmental health practitioner- desk work   Leisure walk for exercise, travel     Cognition   Overall Cognitive Status Within Functional Limits for tasks assessed     Observation/Other Assessments   Focus on Therapeutic Outcomes (FOTO)  31% limitation     Sensation   Additional Comments Height: 67.5 inches     Posture/Postural Control   Posture/Postural Control Postural limitations   Postural Limitations Flexed trunk;Forward head;Rounded Shoulders;Increased thoracic kyphosis   Posture Comments thoracic scoliosis, kyphosis, significant forward head     ROM / Strength   AROM / PROM / Strength AROM;PROM;Strength     AROM   Overall AROM  Deficits   Overall AROM Comments lumbar A/ROM limited by 25% in bil sidebending.  Cervical sidebending limited by 50%, rotation limited by 25%     Strength   Overall Strength Within functional limits for tasks performed   Overall Strength Comments 4+/5 bil UE and LE strength.  Postural weakness with inability to hold head in neutral     Palpation   Palpation comment mild palpable tenderness over bil lumbar paraspinals.       Transfers   Transfers Stand to Sit;Sit to Stand;Independent with all Transfers   Five time sit to stand comments  9 seconds     Ambulation/Gait   Ambulation/Gait Yes   Gait Pattern Within Functional Limits   Gait Comments with above described posture            Objective measurements completed on examination: See above findings.                  PT Education - 08/30/16 1306    Education  provided Yes   Education Details posture, red theraband, chin tucks   Person(s) Educated Patient   Methods Demonstration;Explanation;Handout   Comprehension Verbalized understanding;Returned demonstration          PT Short Term Goals - 08/30/16 1325      PT SHORT TERM GOAL #1   Title be independent in initial HEP   Time 4   Period Weeks   Status New     PT SHORT TERM  GOAL #2   Title demonstrate improved seated posture and report postural corrections at home and work   Time 4   Period Weeks   Status New     PT SHORT TERM GOAL #3   Title verbalize and demonstrate understanding of body mechanics modifications for lumbar protection and osteoprosis precautions   Time 4   Period Weeks   Status New     PT SHORT TERM GOAL #4   Title report a 25% reduction in LBP with home tasks   Time 4   Period Weeks   Status New           PT Long Term Goals - 02-Sep-2016 1231      PT LONG TERM GOAL #1   Title be independent in advanced HEP   Time 8   Period Weeks   Status New     PT LONG TERM GOAL #2   Title reduce FOTO to < or = to 27% limitation   Time 8   Period Weeks   Status New     PT LONG TERM GOAL #3   Title report a 50% reduction in LBP with home tasks   Time 8   Status New     PT LONG TERM GOAL #4   Title report no falls at home or in the community and 50% improved confidence with balance   Time 8   Period Weeks   Status New                Plan - 2016/09/02 1317    Clinical Impression Statement Pt presents to PT with new diangosis of osteoporosis with t score -1.5.  Pt also has LBP of a chronic nature and balance deficits.  Pt demonstrates significant forward head posture, thoracic scoliosis and kyphosis,  and flexed trunk in standing.  Pt with postural weakness.  5x sit to stand is 9 seconds.  Pt reports 4/10 LBP with activity.  Pt will benefit from skilled PT for instruction in HEP for balance, LBP, postural strength and to promote bone density and  education regarding osteoporosis precautions.     History and Personal Factors relevant to plan of care: osteoporosis, OA in bil knees   Clinical Presentation Evolving   Clinical Presentation due to: balance is progressing with 2 falls, LBP has increased recently   Clinical Decision Making Moderate   Rehab Potential Good   PT Frequency 2x / week   PT Duration 8 weeks   PT Treatment/Interventions ADLs/Self Care Home Management;Cryotherapy;Electrical Stimulation;Functional mobility training;Gait training;Moist Heat;Therapeutic activities;Therapeutic exercise;Balance training;Neuromuscular re-education;Patient/family education;Passive range of motion;Manual techniques;Taping   PT Next Visit Plan body mechanics/osteoporosis education, postural strength, core strength, balance exercises   Consulted and Agree with Plan of Care Patient      Patient will benefit from skilled therapeutic intervention in order to improve the following deficits and impairments:  Decreased activity tolerance, Decreased strength, Postural dysfunction, Improper body mechanics, Impaired flexibility, Decreased range of motion, Decreased endurance, Pain  Visit Diagnosis: Abnormal posture  Muscle weakness (generalized)  Chronic left-sided low back pain without sciatica      G-Codes - Sep 02, 2016 1230    Functional Assessment Tool Used (Outpatient Only) FOTO: 31% limitation   Functional Limitation Other PT primary   Other PT Primary Current Status (I2035) At least 20 percent but less than 40 percent impaired, limited or restricted   Other PT Primary Goal Status (D9741) At least 20 percent but less than 40 percent impaired, limited or restricted  Problem List Patient Active Problem List   Diagnosis Date Noted  . Tremor, essential 12/03/2015  . Essential hypertension 12/09/2014  . Coronary artery disease involving native heart 12/08/2013  . 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[place]) 12/08/2013  . Hyperlipidemia  12/08/2013  . Benign prostatic hypertrophy 08/08/2013  . Essential and other specified forms of tremor 11/25/2012    Sigurd Sos, PT 08/30/16 1:33 PM  [facility] [address], [place], [place], [address] Phone: [phone]   Fax:  [phone]  Name: LARRIE FRAIZER MRN: [id number] Date of Birth: 1945/04/09

## 2016-08-30 NOTE — Patient Instructions (Signed)
Posture - Standing   Good posture is important. Avoid slouching and forward head thrust. Maintain curve in low back and align ears over shoulders, hips over ankles.  Pull your belly button in toward your back bone. Posture Tips DO: - stand tall and erect - keep chin tucked in - keep head and shoulders in alignment - check posture regularly in mirror or large window - pull head back against headrest in car seat;  Change your position often.  Sit with lumbar support. DON'T: - slouch or slump while watching TV or reading - sit, stand or lie in one position  for too long;  Sitting is especially hard on the spine so if you sit at a desk/use the computer, then stand up often! Copyright  VHI. All rights reserved.  Posture - Sitting  Sit upright, head facing forward. Try using a roll to support lower back. Keep shoulders relaxed, and avoid rounded back. Keep hips level with knees. Avoid crossing legs for long periods. Copyright  VHI. All rights reserved.  Chronic neck strain can develop because of poor posture and faulty work habits  Postural strain related to slumped sitting and forward head posture is a leading cause of headaches, neck and upper back pain  General strengthening and flexibility exercises are helpful in the treatment of neck pain.  Most importantly, you should learn to correct the posture that may be contributing to chronic pain.   Change positions frequently  Change your work or home environment to improve posture and mechanics.   Axial Extension (Chin Tuck)    Pull chin in and lengthen back of neck. Hold __5__ seconds while counting out loud. Repeat __5__ times. Do _many___ sessions per day.   Squeeze the shoulder blades (row) while you sit.  Hold 5 seconds.  Do 10  Resisted Horizontal Abduction: Bilateral   Sit or stand, tubing in both hands, arms out in front. Keeping arms straight, pinch shoulder blades together and stretch arms out. Repeat _10___ times per set.  Do 2____ sets per session. Do _1-2___ sessions per day.   Scapular Retraction: Elbow Flexion (Standing).  Red band   With elbows bent to 90, pinch shoulder blades together and rotate arms out, keeping elbows bent. Repeat _10___ times per set. Do _1___ sets per session. Do many____ sessions per day.    [facility] [address] [place], [place] [address] Phone # [phone] Fax [phone]

## 2016-09-11 ENCOUNTER — Ambulatory Visit: Payer: Medicare Other | Attending: Internal Medicine | Admitting: Rehabilitative and Restorative Service Providers"

## 2016-09-11 DIAGNOSIS — M545 Low back pain, unspecified: Secondary | ICD-10-CM

## 2016-09-11 DIAGNOSIS — G8929 Other chronic pain: Secondary | ICD-10-CM | POA: Diagnosis not present

## 2016-09-11 DIAGNOSIS — M6281 Muscle weakness (generalized): Secondary | ICD-10-CM | POA: Diagnosis not present

## 2016-09-11 DIAGNOSIS — R293 Abnormal posture: Secondary | ICD-10-CM | POA: Insufficient documentation

## 2016-09-11 NOTE — Therapy (Signed)
[facility] [address] [place], [place], [address] Phone: [phone]   Fax:  [phone]  Physical Therapy Treatment  Patient Details  Name: Cole Martinez MRN: [id number] Date of Birth: Oct 18, 1945 Referring Provider: Delrae Rend, MD   Encounter Date: 09/11/2016      PT End of Session - 09/11/16 1051    Visit Number 2   Number of Visits 10   Date for PT Re-Evaluation 10/25/16   PT Start Time 1013   PT Stop Time 1058   PT Time Calculation (min) 45 min   Activity Tolerance Patient tolerated treatment well;No increased pain   Behavior During Therapy WFL for tasks assessed/performed      Past Medical History:  Diagnosis Date  . ADHD (attention deficit hyperactivity disorder)   . B12 deficiency   . BPH (benign prostatic hypertrophy)   . 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 Coronary artery disease CARDIOLOGIST- DR Daneen Schick   S/P STENTING LAD 1999  . Crohn's disease of ileum ([place]) SINCE 1988  . ED (erectile dysfunction)   . Elevated PSA   . Essential and other specified forms of tremor 11/25/2012  . H/O adenomatous polyp of colon   . Hyperlipidemia   . Nocturia   . OA (osteoarthritis)   . Peripheral neuropathy    hx of, none current as of 08-04-13  . Peyronie disease   . S/P coronary artery stent placement OCT 1999 OF LAD  . Tremor, hereditary, benign MILD RIGHT HAND    Past Surgical History:  Procedure Laterality Date  . CARDIOVASCULAR STRESS TEST  11-01-2010 DR Daneen Schick   NORMAL PERFUSION STUDY/ EF 64%/ NO ISCHEMIA  . COLONOSCOPY WITH PROPOFOL N/A 09/24/2012   Procedure: COLONOSCOPY WITH PROPOFOL;  Surgeon: [name], MD;  Location: [facility];  Service: Endoscopy;  Laterality: N/A;  . CORONARY ANGIOPLASTY WITH STENT PLACEMENT  OCT 1999   STENT OF LAD  . LAPAROSCOPIC INGUINAL  HERNIA REPAIR Bilateral 01-10-2004   W/ MESH  . neck benign removed from neck  yrs ago  . PROSTATE BIOPSY N/A 07/12/2012   Procedure: PROSTATE BIOPSY AND ULTRASOUND;  Surgeon: Ailene Rud, MD;  [facility];  Service: Urology;  Laterality: N/A;  . PROSTATE SURGERY  2002   tuna  . REMOVAL LEFT NECK LYMPH NODE  1996  . TONSILLECTOMY  CHILD  . TRANSURETHRAL RESECTION OF PROSTATE N/A 08/08/2013   Procedure: TRANSURETHRAL RESECTION OF THE PROSTATE WITH GYRUS INSTRUMENTS;  Surgeon: Ailene Rud, MD;  Location: [facility];  Service: Urology;  Laterality: N/A;  . TRANSURETHRAL RESECTION OF PROSTATE      There were no vitals filed for this visit.      Subjective Assessment - 09/11/16 1032    Subjective I was really bad last week. I didn't do any of my exercises. I did watch my posture in the car though   Pertinent History osteoporosis: t score -1.5, considering Rt knee replacement   How long can you stand comfortably? limited by knee pain   How long can you walk comfortably? limited by knee pain   Diagnostic tests OA in Rt hip, no imaging of low back.  Osteoporosis    Patient Stated Goals return to walking, improve posture   Currently in Pain? No/denies   Pain Location Back   Pain Orientation Left;Lower  Pain Type Chronic pain   Pain Onset More than a month ago   Aggravating Factors  standing and walking   Pain Relieving Factors meds                         [facility] Adult PT Treatment/Exercise - 09/11/16 0001      Lumbar Exercises: Supine   Other Supine Lumbar Exercises tilt x 20; tilt with march x 15, tilt with SLR x 15, tilt with clam shell x 20; tilt with SLR/hip abdct combo x 15; tilt with red theraband chest pull x 20; tilt with red band ER x 20; tilt with hip pull red band x 20; tilt with bil shoulder flex/ext x 20 each; tilt with red band diagonal pull x 20 each direction; tilt with therball roll to knees; tilt with diagonal theraball  roll x 20; combination of diagonal and neutral ball rolls x 15 each direction; tilt with  ball squeeze with alt LAQ x 20; tilt with glute set/ball squeeze/bridge combo x 20; tilt with heel taps x 20     Lumbar Exercises: Sidelying   Other Sidelying Lumbar Exercises tilt with hip abdct with knee flexion x 10 with max PT verbal cues to decrease cadence of exercise                  PT Short Term Goals - 09/11/16 1040      PT SHORT TERM GOAL #1   Title be independent in initial HEP   Time 4   Period Weeks   Status On-going     PT SHORT TERM GOAL #2   Title demonstrate improved seated posture and report postural corrections at home and work   Time 4   Period Weeks   Status On-going     PT SHORT TERM GOAL #3   Title verbalize and demonstrate understanding of body mechanics modifications for lumbar protection and osteoprosis precautions   Time 4   Period Weeks   Status On-going     PT SHORT TERM GOAL #4   Title report a 25% reduction in LBP with home tasks   Time 4   Period Weeks   Status On-going           PT Long Term Goals - 08/30/16 1231      PT LONG TERM GOAL #1   Title be independent in advanced HEP   Time 8   Period Weeks   Status New     PT LONG TERM GOAL #2   Title reduce FOTO to < or = to 27% limitation   Time 8   Period Weeks   Status New     PT LONG TERM GOAL #3   Title report a 50% reduction in LBP with home tasks   Time 8   Status New     PT LONG TERM GOAL #4   Title report no falls at home or in the community and 50% improved confidence with balance   Time 8   Period Weeks   Status New               Plan - 09/11/16 1037    Clinical Impression Statement Pt presents to PT with  new dx of osteoporosis and has a history of chronic LBP and balance deficit. Pt would benefit from further PT for postural strengthening and lumbar/core strengthening. Balance training also to be performed per evaluation. Proper core strength/activation  focus of treatment.  HEP reviewed.   Rehab Potential Good   PT Frequency 2x / week   PT Duration 8 weeks   PT Treatment/Interventions ADLs/Self Care Home Management;Cryotherapy;Electrical Stimulation;Functional mobility training;Gait training;Moist Heat;Therapeutic activities;Therapeutic exercise;Balance training;Neuromuscular re-education;Patient/family education;Passive range of motion;Manual techniques;Taping   PT Next Visit Plan body mechanics/osteoporosis education, postural strength, core strength, balance exercises   Consulted and Agree with Plan of Care Patient      Patient will benefit from skilled therapeutic intervention in order to improve the following deficits and impairments:  Decreased activity tolerance, Decreased strength, Postural dysfunction, Improper body mechanics, Impaired flexibility, Decreased range of motion, Decreased endurance, Pain  Visit Diagnosis: Abnormal posture  Muscle weakness (generalized)  Chronic left-sided low back pain without sciatica     Problem List Patient Active Problem List   Diagnosis Date Noted  . Tremor, essential 12/03/2015  . Essential hypertension 12/09/2014  . Coronary artery disease involving native heart 12/08/2013  . 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[place]) 12/08/2013  . Hyperlipidemia 12/08/2013  . Benign prostatic hypertrophy 08/08/2013  . Essential and other specified forms of tremor 11/25/2012    ARTIS,Joreen Swearingin, PT 09/11/2016, 11:00 AM  [facility] [address], [place], [place], [address] Phone: [phone]   Fax:  [phone]  Name: Cole Martinez MRN: [id number] Date of Birth: 05-24-1945

## 2016-09-13 ENCOUNTER — Ambulatory Visit: Payer: Medicare Other

## 2016-09-13 DIAGNOSIS — R293 Abnormal posture: Secondary | ICD-10-CM

## 2016-09-13 DIAGNOSIS — M6281 Muscle weakness (generalized): Secondary | ICD-10-CM | POA: Diagnosis not present

## 2016-09-13 DIAGNOSIS — G8929 Other chronic pain: Secondary | ICD-10-CM | POA: Diagnosis not present

## 2016-09-13 DIAGNOSIS — M545 Low back pain, unspecified: Secondary | ICD-10-CM

## 2016-09-13 NOTE — Therapy (Signed)
[facility] [address] [place], [place], [address] Phone: [phone]   Fax:  [phone]  Physical Therapy Treatment  Patient Details  Name: Cole Martinez MRN: [id number] Date of Birth: 1945/04/02 Referring Provider: Delrae Rend, MD   Encounter Date: 09/13/2016      PT End of Session - 09/13/16 1656    Visit Number 3   Number of Visits 10   Date for PT Re-Evaluation 10/25/16   PT Start Time 8088   PT Stop Time 1612   PT Time Calculation (min) 41 min   Activity Tolerance Patient tolerated treatment well   Behavior During Therapy [facility] for tasks assessed/performed      Past Medical History:  Diagnosis Date  . ADHD (attention deficit hyperactivity disorder)   . B12 deficiency   . BPH (benign prostatic hypertrophy)   . 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 Coronary artery disease CARDIOLOGIST- DR Daneen Schick   S/P STENTING LAD 1999  . Crohn's disease of ileum ([place]) SINCE 1988  . ED (erectile dysfunction)   . Elevated PSA   . Essential and other specified forms of tremor 11/25/2012  . H/O adenomatous polyp of colon   . Hyperlipidemia   . Nocturia   . OA (osteoarthritis)   . Peripheral neuropathy    hx of, none current as of 08-04-13  . Peyronie disease   . S/P coronary artery stent placement OCT 1999 OF LAD  . Tremor, hereditary, benign MILD RIGHT HAND    Past Surgical History:  Procedure Laterality Date  . CARDIOVASCULAR STRESS TEST  11-01-2010 DR Daneen Schick   NORMAL PERFUSION STUDY/ EF 64%/ NO ISCHEMIA  . COLONOSCOPY WITH PROPOFOL N/A 09/24/2012   Procedure: COLONOSCOPY WITH PROPOFOL;  Surgeon: [name], MD;  [facility];  Service: Endoscopy;  Laterality: N/A;  . CORONARY ANGIOPLASTY WITH STENT PLACEMENT  OCT 1999   STENT OF LAD  . LAPAROSCOPIC INGUINAL HERNIA REPAIR  Bilateral 01-10-2004   W/ MESH  . neck benign removed from neck  yrs ago  . PROSTATE BIOPSY N/A 07/12/2012   Procedure: PROSTATE BIOPSY AND ULTRASOUND;  Surgeon: Ailene Rud, MD;  Location: [facility];  Service: Urology;  Laterality: N/A;  . PROSTATE SURGERY  2002   tuna  . REMOVAL LEFT NECK LYMPH NODE  1996  . TONSILLECTOMY  CHILD  . TRANSURETHRAL RESECTION OF PROSTATE N/A 08/08/2013   Procedure: TRANSURETHRAL RESECTION OF THE PROSTATE WITH GYRUS INSTRUMENTS;  Surgeon: Ailene Rud, MD;  [facility];  Service: Urology;  Laterality: N/A;  . TRANSURETHRAL RESECTION OF PROSTATE      There were no vitals filed for this visit.      Subjective Assessment - 09/13/16 1638    Subjective I have been doing the band exercises at home.     Currently in Pain? No/denies                         [facility] Adult PT Treatment/Exercise - 09/13/16 0001      Exercises   Exercises Shoulder;Neck     Neck Exercises: Seated   Shoulder Flexion 20 reps;Both   Shoulder Flexion Weights (lbs) 1   Shoulder ABduction Both;20 reps;Weights   Shoulder Abduction Weights (lbs) 1   Shoulder Abduction Limitations scaption 1# 2x10  Lumbar Exercises: Aerobic   UBE (Upper Arm Bike) Level 1 x 6 minutes reverse  verbal cues for posture     Lumbar Exercises: Standing   Other Standing Lumbar Exercises reverse walking 25# x 10 with abdominal bracing   Other Standing Lumbar Exercises standing hip abduction and extension with abdominal bracing 2x10     Shoulder Exercises: Seated   Horizontal ABduction Strengthening;Both;20 reps;Theraband   Theraband Level (Shoulder Horizontal ABduction) Level 2 (Red)   External Rotation Strengthening;Both;20 reps;Theraband   Theraband Level (Shoulder External Rotation) Level 2 (Red)     Shoulder Exercises: Power [employer] 20 reps   Row Limitations 35# 2x10                  PT Short Term Goals - 09/13/16 1639       PT SHORT TERM GOAL #1   Title be independent in initial HEP   Status Achieved     PT SHORT TERM GOAL #2   Title demonstrate improved seated posture and report postural corrections at home and work   Period Weeks   Status On-going     PT SHORT TERM GOAL #3   Title verbalize and demonstrate understanding of body mechanics modifications for lumbar protection and osteoprosis precautions   Time 4   Period Weeks   Status On-going     PT SHORT TERM GOAL #4   Title report a 25% reduction in LBP with home tasks   Time 4   Period Weeks   Status On-going           PT Long Term Goals - 08/30/16 1231      PT LONG TERM GOAL #1   Title be independent in advanced HEP   Time 8   Period Weeks   Status New     PT LONG TERM GOAL #2   Title reduce FOTO to < or = to 27% limitation   Time 8   Period Weeks   Status New     PT LONG TERM GOAL #3   Title report a 50% reduction in LBP with home tasks   Time 8   Status New     PT LONG TERM GOAL #4   Title report no falls at home or in the community and 50% improved confidence with balance   Time 8   Period Weeks   Status New               Plan - 09/13/16 1640    Clinical Impression Statement Pt with continued postural weakness and strength deficits.  Pt tolerated all exercises in the clinic for strength, posture and weightbearing exercise.  Pt continues to make postural corrections at home and is limiting his flexion and rotation.  Pt will continue to benefit form skilled PT for postural strength, weightbearing exercise, decompression and gym exercises.     Rehab Potential Good   PT Frequency 2x / week   PT Duration 8 weeks   PT Treatment/Interventions ADLs/Self Care Home Management;Cryotherapy;Electrical Stimulation;Functional mobility training;Gait training;Moist Heat;Therapeutic activities;Therapeutic exercise;Balance training;Neuromuscular re-education;Patient/family education;Passive range of motion;Manual techniques;Taping    PT Next Visit Plan body mechanics/osteoporosis education to address STG, postural strength, core strength, balance exercises   Recommended Other Services initial certification is signed   Consulted and Agree with Plan of Care Patient      Patient will benefit from skilled therapeutic intervention in order to improve the following deficits and impairments:  Decreased activity tolerance, Decreased strength, Postural  dysfunction, Improper body mechanics, Impaired flexibility, Decreased range of motion, Decreased endurance, Pain  Visit Diagnosis: Muscle weakness (generalized)  Abnormal posture  Chronic left-sided low back pain without sciatica     Problem List Patient Active Problem List   Diagnosis Date Noted  . Tremor, essential 12/03/2015  . Essential hypertension 12/09/2014  . Coronary artery disease involving native heart 12/08/2013  . 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[place]) 12/08/2013  . Hyperlipidemia 12/08/2013  . Benign prostatic hypertrophy 08/08/2013  . Essential and other specified forms of tremor 11/25/2012     Cole Martinez, PT 09/13/16 4:57 PM  [facility] [address] [place], [place], [address] Phone: [phone]   Fax:  [phone]  Name: COLBIN JOVEL MRN: [id number] Date of Birth: 15-Oct-1945

## 2016-09-18 ENCOUNTER — Encounter: Payer: Self-pay | Admitting: Physical Therapy

## 2016-09-18 ENCOUNTER — Ambulatory Visit: Payer: Medicare Other | Admitting: Physical Therapy

## 2016-09-18 DIAGNOSIS — M6281 Muscle weakness (generalized): Secondary | ICD-10-CM | POA: Diagnosis not present

## 2016-09-18 DIAGNOSIS — M545 Low back pain: Secondary | ICD-10-CM | POA: Diagnosis not present

## 2016-09-18 DIAGNOSIS — R293 Abnormal posture: Secondary | ICD-10-CM

## 2016-09-18 DIAGNOSIS — G8929 Other chronic pain: Secondary | ICD-10-CM

## 2016-09-18 NOTE — Patient Instructions (Addendum)
DO's and DON'T's   Avoid and/or Minimize positions of forward bending ( flexion)  Side bending and rotation of the trunk  Especially when movements occur together   When your back aches:   Don't sit down   Lie down on your back with a small pillow under your head and one under your knees or as outlined by our therapist. Or, lie in the 90/90 position ( on the floor with your feet and legs on the sofa with knees and hips bent to 90 degrees)  Tying or putting on your shoes:   Don't bend over to tie your shoes or put on socks.  Instead, bring one foot up, cross it over the opposite knee and bend forward (hinge) at the hips to so the task.  Keep your back straight.  If you cannot do this safely, then you need to use long handled assistive devices such as a shoehorn and sock puller.  Exercising:  Don't engage in ballistic types of exercise routines such as high-impact aerobics or jumping rope  Don't do exercises in the gym that bring you forward (abdominal crunches, sit-ups, touching your  toes, knee-to-chest, straight leg raising.)  Follow a regular exercise program that includes a variety of different weight-bearing activities, such as low-impact aerobics, T' ai chi or walking as your physical therapist advises  Do exercises that emphasize return to normal body alignment and strengthening of the muscles that keep your back straight, as outlined in this program or by your therapist  Household tasks:  Don't reach unnecessarily or twist your trunk when mopping, sweeping, vacuuming, raking, making beds, weeding gardens, getting objects ou of cupboards, etc.  Keep your broom, mop, vacuum, or rake close to you and mover your whole body as you move them. Walk over to the area on which you are working. Arrange kitchen, bathroom, and bedroom shelves so that frequently used items may be reached without excessive bending, twisting, and reaching.  Use a  sturdy stool if necessary.  Don't bend from the waist to pick up something up  Off the floor, out of the trunk of your car, or to brush your teeth, wash your face, etc.   Bend at the knees, keeping back straight as possible. Use a reacher if necessary.   Prevention of fracture is the so-called "BOTTOm -Line" in the management of OSTEOPOROSIS. Do not take unnecessary chances in movement. Once a compression fracture occurs, the process is very difficult to control; one fracture is frequently followed by many more.  WALKING Tips  Walking is the single best weight bearing exercise for most people most of the time. All that is needed is a good pair of walking shoes and a safe place to walk. GUIDELINES: 1.Shoes should be supportive and large enough to easily accommodate your feet in both length and width. 2.To get more out of your walking, practice walking exercises. 3.Establish a regular walking habit. 4.Maintain good upper body alignment when walking. CONSIDER the use of a heart rate monitor and/or pedometer to add purpose, goals, and variability to your walking workout.  Copyright  VHI. All rights reserved.  Heel / Toe    Walk so that the heel of each foot comes down first, toes leave the ground last.   Copyright  VHI. All rights reserved.  Walk Taller    Pretend that you are 2-4 inches taller than you think or have been told you are. "Attach" a string, rope, golden thread, or steel cable to the top of  your head to help pull you up. Pretend that you are a puppet being held up by this string.   Copyright  VHI. All rights reserved.   Squat with chin and shoulder down at the gym. Bridge    Place a towel roll under head. Keep space between the chin and chest. Lift hips, keeping pelvis level. Do when you are at the gym.   http://ss.exer.us/364   Copyright  VHI. All rights reserved.  Arm / Leg Extension: Alternate (All-Fours)    Raise right arm and opposite leg. Do not arch  neck. When at the gym.  http://orth.exer.us/110   Copyright  VHI. All rights reserved.  Angry Cat Stretch    Go from flat back to arched back when at the gym.   http://orth.exer.us/118   Copyright  VHI. All rights reserved.   Continue with your planks due to doing a great job.   [email or web address] [address], [place], [place] [address] Phone # [phone] Fax [phone]

## 2016-09-18 NOTE — Therapy (Signed)
[facility] [address], [place], [place], [address] Phone: [phone]   Fax:  [phone]  Physical Therapy Treatment  Patient Details  Name: Cole Martinez MRN: [id number] Date of Birth: Jan 28, 1946 Referring Provider: Delrae Rend, MD   Encounter Date: 09/18/2016      PT End of Session - 09/18/16 1321    Visit Number 4   Number of Visits 10   Date for PT Re-Evaluation 10/25/16   Authorization Type medicare g-code on 10th visit   PT Start Time 1230   PT Stop Time 1310   PT Time Calculation (min) 40 min   Activity Tolerance Patient tolerated treatment well   Behavior During Therapy [facility] for tasks assessed/performed      Past Medical History:  Diagnosis Date  . ADHD (attention deficit hyperactivity disorder)   . B12 deficiency   . BPH (benign prostatic hypertrophy)   . 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 Coronary artery disease CARDIOLOGIST- DR Daneen Schick   S/P STENTING LAD 1999  . Crohn's disease of ileum ([place]) SINCE 1988  . ED (erectile dysfunction)   . Elevated PSA   . Essential and other specified forms of tremor 11/25/2012  . H/O adenomatous polyp of colon   . Hyperlipidemia   . Nocturia   . OA (osteoarthritis)   . Peripheral neuropathy    hx of, none current as of 08-04-13  . Peyronie disease   . S/P coronary artery stent placement OCT 1999 OF LAD  . Tremor, hereditary, benign MILD RIGHT HAND    Past Surgical History:  Procedure Laterality Date  . CARDIOVASCULAR STRESS TEST  11-01-2010 DR Daneen Schick   NORMAL PERFUSION STUDY/ EF 64%/ NO ISCHEMIA  . COLONOSCOPY WITH PROPOFOL N/A 09/24/2012   Procedure: COLONOSCOPY WITH PROPOFOL;  Surgeon: [name], MD;  Location: [facility];  Service: Endoscopy;  Laterality: N/A;  . CORONARY ANGIOPLASTY WITH STENT PLACEMENT  OCT 1999   STENT  OF LAD  . LAPAROSCOPIC INGUINAL HERNIA REPAIR Bilateral 01-10-2004   W/ MESH  . neck benign removed from neck  yrs ago  . PROSTATE BIOPSY N/A 07/12/2012   Procedure: PROSTATE BIOPSY AND ULTRASOUND;  Surgeon: Ailene Rud, MD;  Location: [facility];  Service: Urology;  Laterality: N/A;  . PROSTATE SURGERY  2002   tuna  . REMOVAL LEFT NECK LYMPH NODE  1996  . TONSILLECTOMY  CHILD  . TRANSURETHRAL RESECTION OF PROSTATE N/A 08/08/2013   Procedure: TRANSURETHRAL RESECTION OF THE PROSTATE WITH GYRUS INSTRUMENTS;  Surgeon: Ailene Rud, MD;  Location: [facility];  Service: Urology;  Laterality: N/A;  . TRANSURETHRAL RESECTION OF PROSTATE      There were no vitals filed for this visit.      Subjective Assessment - 09/18/16 1237    Subjective I feel my feet are not straight when I walk.    Pertinent History osteoporosis: t score -1.5, considering Rt knee replacement   How long can you stand comfortably? limited by knee pain   How long can you walk comfortably? limited by knee pain   Diagnostic tests OA in Rt hip, no imaging of low back.  Osteoporosis    Patient Stated Goals return to walking, improve posture   Currently in Pain? No/denies  [facility] Adult PT Treatment/Exercise - 09/18/16 0001      Ambulation/Gait   Gait Comments gait training in front of mirror with increased base of support, decreased hip ER and standing upright and heel strike     Therapeutic Activites    Therapeutic Activities Other Therapeutic Activities                PT Education - 09/18/16 1316    Education provided Yes   Education Details posture with working with his trainer, do and do not with osteoporosis, walking with increased base of support   Person(s) Educated Patient   Methods Explanation;Demonstration;Verbal cues;Handout   Comprehension Returned demonstration;Verbalized understanding          PT Short Term Goals - 09/18/16  1238      PT SHORT TERM GOAL #1   Title be independent in initial HEP   Time 4   Period Weeks   Status Achieved     PT SHORT TERM GOAL #2   Title demonstrate improved seated posture and report postural corrections at home and work   Time 4   Period Weeks   Status Achieved  doing better especially in the car     PT [place] #3   Title verbalize and demonstrate understanding of body mechanics modifications for lumbar protection and osteoprosis precautions   Time 4   Period Weeks   Status Achieved     PT SHORT TERM GOAL #4   Title report a 25% reduction in LBP with home tasks   Time 4   Period Weeks   Status On-going           PT Long Term Goals - 08/30/16 1231      PT LONG TERM GOAL #1   Title be independent in advanced HEP   Time 8   Period Weeks   Status New     PT LONG TERM GOAL #2   Title reduce FOTO to < or = to 27% limitation   Time 8   Period Weeks   Status New     PT LONG TERM GOAL #3   Title report a 50% reduction in LBP with home tasks   Time 8   Status New     PT LONG TERM GOAL #4   Title report no falls at home or in the community and 50% improved confidence with balance   Time 8   Period Weeks   Status New               Plan - 09/18/16 1321    Clinical Impression Statement Patient ambulates with decreased base of support and bil. hips ER.  Patient understands how to flex at hips instead of the spine. Patient has not pain today.  Patient understands how to walk with upright posture and how to exercise at the gym with trainer while decreasing stress on spine. Patient will benefit from skilled Pt for postural strength, weightbearing exercise, decompression and gym exercises.    Rehab Potential Good   PT Frequency 2x / week   PT Duration 8 weeks   PT Treatment/Interventions ADLs/Self Care Home Management;Cryotherapy;Electrical Stimulation;Functional mobility training;Gait training;Moist Heat;Therapeutic activities;Therapeutic  exercise;Balance training;Neuromuscular re-education;Patient/family education;Passive range of motion;Manual techniques;Taping   PT Next Visit Plan  postural strength, core strength, balance exercises, address back pain   PT Home Exercise Plan progress as needed   Consulted and Agree with Plan of Care Patient      Patient will benefit from  skilled therapeutic intervention in order to improve the following deficits and impairments:  Decreased activity tolerance, Decreased strength, Postural dysfunction, Improper body mechanics, Impaired flexibility, Decreased range of motion, Decreased endurance, Pain  Visit Diagnosis: Muscle weakness (generalized)  Abnormal posture  Chronic left-sided low back pain without sciatica     Problem List Patient Active Problem List   Diagnosis Date Noted  . Tremor, essential 12/03/2015  . Essential hypertension 12/09/2014  . Coronary artery disease involving native heart 12/08/2013  . 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[place]) 12/08/2013  . Hyperlipidemia 12/08/2013  . Benign prostatic hypertrophy 08/08/2013  . Essential and other specified forms of tremor 11/25/2012    Earlie Counts, PT 09/18/16 1:27 PM   [facility] [address], [place], [place], [address] Phone: [phone]   Fax:  [phone]  Name: KEISHAWN RAJEWSKI MRN: [id number] Date of Birth: 03-26-1945

## 2016-09-20 ENCOUNTER — Ambulatory Visit: Payer: Medicare Other

## 2016-09-20 DIAGNOSIS — M545 Low back pain, unspecified: Secondary | ICD-10-CM

## 2016-09-20 DIAGNOSIS — M6281 Muscle weakness (generalized): Secondary | ICD-10-CM

## 2016-09-20 DIAGNOSIS — R293 Abnormal posture: Secondary | ICD-10-CM | POA: Diagnosis not present

## 2016-09-20 DIAGNOSIS — G8929 Other chronic pain: Secondary | ICD-10-CM

## 2016-09-20 NOTE — Therapy (Signed)
[facility] [address], [place], [place], [address] Phone: [phone]   Fax:  [phone]  Physical Therapy Treatment  Patient Details  Name: Cole Martinez MRN: [id number] Date of Birth: 1945/05/02 Referring Provider: Delrae Rend, MD   Encounter Date: 09/20/2016      Cole Martinez End of Session - 09/20/16 1306    Visit Number 5   Number of Visits 10   Date for Cole Martinez Re-Evaluation 10/25/16   Authorization Type medicare g-code on 10th visit   Cole Martinez Start Time 1227   Cole Martinez Stop Time 1306   Cole Martinez Time Calculation (min) 39 min   Activity Tolerance Patient tolerated treatment well   Behavior During Therapy [facility] for tasks assessed/performed      Past Medical History:  Diagnosis Date  . ADHD (attention deficit hyperactivity disorder)   . B12 deficiency   . BPH (benign prostatic hypertrophy)   . 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 Coronary artery disease CARDIOLOGIST- DR Daneen Schick   S/P STENTING LAD 1999  . Crohn's disease of ileum ([place]) SINCE 1988  . ED (erectile dysfunction)   . Elevated PSA   . Essential and other specified forms of tremor 11/25/2012  . H/O adenomatous polyp of colon   . Hyperlipidemia   . Nocturia   . OA (osteoarthritis)   . Peripheral neuropathy    hx of, none current as of 08-04-13  . Peyronie disease   . S/P coronary artery stent placement OCT 1999 OF LAD  . Tremor, hereditary, benign MILD RIGHT HAND    Past Surgical History:  Procedure Laterality Date  . CARDIOVASCULAR STRESS TEST  11-01-2010 DR Daneen Schick   NORMAL PERFUSION STUDY/ EF 64%/ NO ISCHEMIA  . COLONOSCOPY WITH PROPOFOL N/A 09/24/2012   Procedure: COLONOSCOPY WITH PROPOFOL;  Surgeon: [name], MD;  [facility];  Service: Endoscopy;  Laterality: N/A;  . CORONARY ANGIOPLASTY WITH STENT PLACEMENT  OCT 1999   STENT  OF LAD  . LAPAROSCOPIC INGUINAL HERNIA REPAIR Bilateral 01-10-2004   W/ MESH  . neck benign removed from neck  yrs ago  . PROSTATE BIOPSY N/A 07/12/2012   Procedure: PROSTATE BIOPSY AND ULTRASOUND;  Surgeon: Ailene Rud, MD;  Location: [facility];  Service: Urology;  Laterality: N/A;  . PROSTATE SURGERY  2002   tuna  . REMOVAL LEFT NECK LYMPH NODE  1996  . TONSILLECTOMY  CHILD  . TRANSURETHRAL RESECTION OF PROSTATE N/A 08/08/2013   Procedure: TRANSURETHRAL RESECTION OF THE PROSTATE WITH GYRUS INSTRUMENTS;  Surgeon: Ailene Rud, MD;  [facility];  Service: Urology;  Laterality: N/A;  . TRANSURETHRAL RESECTION OF PROSTATE      There were no vitals filed for this visit.      Subjective Assessment - 09/20/16 1231    Subjective I worked on heel to toe walking and my hip didn't hurt.     Pertinent History osteoporosis: t score -1.5, considering Rt knee replacement   Currently in Pain? No/denies                         [facility] Adult Cole Martinez Treatment/Exercise - 09/20/16 0001      Neck Exercises: Machines for Strengthening   UBE (Upper Arm Bike) Level 1 x 6 (3/3)  Cole Martinez present to discuss progress  Lumbar Exercises: Supine   Bridge 20 reps  ball under legs   Other Supine Lumbar Exercises on foam roll: decompression x 3 minutes with abdominal bracing.  Supine theraband on foam roll: D2, horizontal abduction, ER and overhead flexion with band.  Red.                Cole Martinez Education - 09/20/16 1305    Education provided Yes   Education Details supine on foam roll with theraband   Person(s) Educated Patient   Methods Explanation;Demonstration;Handout   Comprehension Returned demonstration;Verbalized understanding          Cole Martinez Short Term Goals - 09/18/16 1238      Cole Martinez SHORT TERM GOAL #1   Title be independent in initial HEP   Time 4   Period Weeks   Status Achieved     Cole Martinez SHORT TERM GOAL #2   Title demonstrate improved seated  posture and report postural corrections at home and work   Time 4   Period Weeks   Status Achieved  doing better especially in the car     Cole Martinez [place] #3   Title verbalize and demonstrate understanding of body mechanics modifications for lumbar protection and osteoprosis precautions   Time 4   Period Weeks   Status Achieved     Cole Martinez SHORT TERM GOAL #4   Title report a 25% reduction in LBP with home tasks   Time 4   Period Weeks   Status On-going           Cole Martinez Long Term Goals - 08/30/16 1231      Cole Martinez LONG TERM GOAL #1   Title be independent in advanced HEP   Time 8   Period Weeks   Status New     Cole Martinez LONG TERM GOAL #2   Title reduce FOTO to < or = to 27% limitation   Time 8   Period Weeks   Status New     Cole Martinez LONG TERM GOAL #3   Title report a 50% reduction in LBP with home tasks   Time 8   Status New     Cole Martinez LONG TERM GOAL #4   Title report no falls at home or in the community and 50% improved confidence with balance   Time 8   Period Weeks   Status New               Plan - 09/20/16 1233    Clinical Impression Statement Cole Martinez ambulates with decreased base of support and bil hip ER.  Cole Martinez is working to perform walking with heel to toe gait.  Cole Martinez without pain today.  Cole Martinez is continuing to work on [employer] traffic [employer].  Cole Martinez will continue to benefit from skilled Cole Martinez for strength, endurance, postural alignment and weightbearing activity.     Rehab Potential Good   Cole Martinez Frequency 2x / week   Cole Martinez Duration 8 weeks   Cole Martinez Treatment/Interventions ADLs/Self Care Home Management;Cryotherapy;Electrical Stimulation;Functional mobility training;Gait training;Moist Heat;Therapeutic activities;Therapeutic exercise;Balance training;Neuromuscular re-education;Patient/family education;Passive range of motion;Manual techniques;Taping   Cole Martinez Next Visit Plan  postural strength, core strength, balance exercises, address back pain if needed.  Review HEP on foam roll   Consulted and Agree with  Plan of Care Patient      Patient will benefit from skilled therapeutic intervention in order to improve the following deficits and impairments:  Decreased activity tolerance, Decreased strength, Postural dysfunction, Improper body mechanics, Impaired flexibility, Decreased range of motion, Decreased endurance,  Pain  Visit Diagnosis: Muscle weakness (generalized)  Chronic left-sided low back pain without sciatica  Abnormal posture     Problem List Patient Active Problem List   Diagnosis Date Noted  . Tremor, essential 12/03/2015  . Essential hypertension 12/09/2014  . Coronary artery disease involving native heart 12/08/2013  . 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[place]) 12/08/2013  . Hyperlipidemia 12/08/2013  . Benign prostatic hypertrophy 08/08/2013  . Essential and other specified forms of tremor 11/25/2012    Cole Martinez, Cole Martinez 09/20/16 1:07 PM  [facility] [address] [place], [place], [address] Phone: [phone]   Fax:  [phone]  Name: Cole Martinez MRN: [id number] Date of Birth: 26-Jul-1945

## 2016-09-20 NOTE — Patient Instructions (Addendum)
Do 1-2 times a day.  Comfort Positions: Flexion / Extension Spine Bias  Stabilization Routine    Get ON TARGET. Lie on full roller. Holding trunk still, lift: right arm right leg alternate legs: march same side right arm, leg opposite side or both arms, left leg Hold each position ___ seconds. Repeat ___ times. Do ___ sessions per day.  Copyright  VHI. All rights reserved.    Over Head Pull: Narrow Grip       On back, knees bent, feet flat, band across thighs, elbows straight but relaxed. Pull hands apart (start). Keeping elbows straight, bring arms up and over head, hands toward floor. Keep pull steady on band. Hold momentarily. Return slowly, keeping pull steady, back to start. Repeat __10_ times. Band color _red___   Side Pull: Double Arm   On back, knees bent, feet flat. Arms perpendicular to body, shoulder level, elbows straight but relaxed. Pull arms out to sides, elbows straight. Resistance band comes across collarbones, hands toward floor. Hold momentarily. Slowly return to starting position. Repeat _10__ times. Band color _red____   Elmer Picker   On back, knees bent, feet flat, left hand on left hip, right hand above left. Pull right arm DIAGONALLY (hip to shoulder) across chest. Bring right arm along head toward floor. Hold momentarily. Slowly return to starting position. Repeat __10_ times. Do with left arm. Band color ___red__   Shoulder Rotation: Double Arm   On back, knees bent, feet flat, elbows tucked at sides, bent 90, hands palms up. Pull hands apart and down toward floor, keeping elbows near sides. Hold momentarily. Slowly return to starting position. Repeat _10__ times. Band color __yellow        [facility] [address], [place], [place] [address] Phone # [phone] Fax [phone]____

## 2016-09-25 ENCOUNTER — Ambulatory Visit: Payer: Medicare Other | Admitting: Physical Therapy

## 2016-09-25 ENCOUNTER — Encounter: Payer: Self-pay | Admitting: Physical Therapy

## 2016-09-25 DIAGNOSIS — M545 Low back pain, unspecified: Secondary | ICD-10-CM

## 2016-09-25 DIAGNOSIS — G8929 Other chronic pain: Secondary | ICD-10-CM | POA: Diagnosis not present

## 2016-09-25 DIAGNOSIS — R293 Abnormal posture: Secondary | ICD-10-CM

## 2016-09-25 DIAGNOSIS — M6281 Muscle weakness (generalized): Secondary | ICD-10-CM

## 2016-09-25 NOTE — Patient Instructions (Addendum)
Over Head Pull: Narrow Grip    On back, knees bent, feet flat, band across thighs, elbows straight but relaxed. Pull hands apart (start). Keeping elbows straight, bring arms up and over head, hands toward floor. Keep pull steady on band. Hold momentarily. Return slowly, keeping pull steady, back to start. Repeat ___ times. Band color ______   Copyright  VHI. All rights reserved.  Over Head Pull: Wide Grip    On back, knees bent, feet flat, band across thighs, elbows straight but relaxed. Pull hands apart (start). Keeping elbows straight, bring arms up and over head, hands toward floor. Keep steady pull on band. Hold momentarily. Return slowly, keeping pull steady, back to start. Repeat ___ times. Band color ______    Copyright  VHI. All rights reserved.  [place]ash    On back, knees bent, feet flat, left hand on left hip, right hand above left. Pull right arm DIAGONALLY (hip to shoulder) across chest. Bring right arm along head toward floor. Hold momentarily. Slowly return to starting position. Repeat ___ times. Do with left arm. Band color ______   Copyright  VHI. All rights reserved.    Additional exs without pictures:  4) On the foam roll: place your hands on your rib cage Rt & Lt. Inhale through the nose, exhale long and slow out your mouth as you draw the lower abdominals in and gently knitt your ribs on the front together and push your ribs down and together to assist those muscles. Do 5-10x  5) On the roll: engage your lower abdominals and hold them there. Then, lift your RT foot off the floor and balance for 1 sec, showing control, then switch and lift the Lt foot. Do this in an alternating pattern with control being your focus. 5-10x

## 2016-09-26 NOTE — Therapy (Signed)
[facility] [address] [place], [place], [address] Phone: [phone]   Fax:  [phone]  Physical Therapy Treatment  Patient Details  Name: Cole Martinez MRN: [id number] Date of Birth: [birth year] Referring Provider: Delrae Rend, MD   Encounter Date: 09/25/2016      PT End of Session - 09/25/16 1050    Visit Number 6   Number of Visits 10   Date for PT Re-Evaluation 10/25/16   Authorization Type medicare g-code on 10th visit   PT Start Time 1015   PT Stop Time 1055   PT Time Calculation (min) 40 min   Activity Tolerance Patient tolerated treatment well   Behavior During Therapy [facility] for tasks assessed/performed      Past Medical History:  Diagnosis Date  . ADHD (attention deficit hyperactivity disorder)   . B12 deficiency   . BPH (benign prostatic hypertrophy)   . 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 Coronary artery disease CARDIOLOGIST- DR Daneen Schick   S/P STENTING LAD 1999  . Crohn's disease of ileum ([place]) SINCE 1988  . ED (erectile dysfunction)   . Elevated PSA   . Essential and other specified forms of tremor 11/25/2012  . H/O adenomatous polyp of colon   . Hyperlipidemia   . Nocturia   . OA (osteoarthritis)   . Peripheral neuropathy    hx of, none current as of 08-04-13  . Peyronie disease   . S/P coronary artery stent placement OCT 1999 OF LAD  . Tremor, hereditary, benign MILD RIGHT HAND    Past Surgical History:  Procedure Laterality Date  . CARDIOVASCULAR STRESS TEST  11-01-2010 DR Daneen Schick   NORMAL PERFUSION STUDY/ EF 64%/ NO ISCHEMIA  . COLONOSCOPY WITH PROPOFOL N/A 09/24/2012   Procedure: COLONOSCOPY WITH PROPOFOL;  Surgeon: [name], MD;  Location: [facility];  Service: Endoscopy;  Laterality: N/A;  . CORONARY ANGIOPLASTY WITH STENT PLACEMENT  OCT 1999   STENT  OF LAD  . LAPAROSCOPIC INGUINAL HERNIA REPAIR Bilateral 01-10-2004   W/ MESH  . neck benign removed from neck  yrs ago  . PROSTATE BIOPSY N/A 07/12/2012   Procedure: PROSTATE BIOPSY AND ULTRASOUND;  Surgeon: Ailene Rud, MD;  Location: [facility];  Service: Urology;  Laterality: N/A;  . PROSTATE SURGERY  2002   tuna  . REMOVAL LEFT NECK LYMPH NODE  1996  . TONSILLECTOMY  CHILD  . TRANSURETHRAL RESECTION OF PROSTATE N/A 08/08/2013   Procedure: TRANSURETHRAL RESECTION OF THE PROSTATE WITH GYRUS INSTRUMENTS;  Surgeon: Ailene Rud, MD;  Location[facility];  Service: Urology;  Laterality: N/A;  . TRANSURETHRAL RESECTION OF PROSTATE      There were no vitals filed for this visit.      Subjective Assessment - 09/25/16 1012    Subjective I am more aware of my body mechanics with activity.  My back pain woke me up in the middle of the night and I had to take advil.    Pertinent History osteoporosis: t score -1.5, considering Rt knee replacement   How long can you stand comfortably? limited by knee pain   How long can you walk comfortably? limited by knee pain   Diagnostic tests OA in Rt hip, no imaging of low back.  Osteoporosis    Patient Stated Goals return to walking, improve posture  Currently in Pain? Yes   Pain Score 6    Pain Location Back   Pain Orientation Lower   Pain Descriptors / Indicators Aching;Sharp   Pain Type Chronic pain   Pain Onset More than a month ago   Pain Frequency Intermittent   Aggravating Factors  standing and walking   Pain Relieving Factors meds   Multiple Pain Sites No                                 PT Education - 09/25/16 1033    Education provided Yes   Education Details Foam roll progression with red band for HEP   Person(s) Educated Patient   Methods Explanation;Demonstration;Tactile cues;Verbal cues;Handout   Comprehension Verbalized understanding;Returned demonstration          PT  Short Term Goals - 09/25/16 1024      PT SHORT TERM GOAL #4   Title report a 25% reduction in LBP with home tasks   Baseline 5% improved; no hip pain now   Time 4   Period Weeks   Status On-going           PT Long Term Goals - 08/30/16 1231      PT LONG TERM GOAL #1   Title be independent in advanced HEP   Time 8   Period Weeks   Status New     PT LONG TERM GOAL #2   Title reduce FOTO to < or = to 27% limitation   Time 8   Period Weeks   Status New     PT LONG TERM GOAL #3   Title report a 50% reduction in LBP with home tasks   Time 8   Status New     PT LONG TERM GOAL #4   Title report no falls at home or in the community and 50% improved confidence with balance   Time 8   Period Weeks   Status New               Plan - 09/25/16 1050    Clinical Impression Statement Patient is having no hip pain due to walking heel toe.  Patient is using the body mechanics he has learned last week with his exercise.  Back pain has improved by 5%. Patient is learning postural muscle strength. Patient will benefit from skilled therapy to improve strength, endurance, psotural alignment and weightbearing activity.    Rehab Potential Good   PT Frequency 2x / week   PT Duration 8 weeks   PT Treatment/Interventions ADLs/Self Care Home Management;Cryotherapy;Electrical Stimulation;Functional mobility training;Gait training;Moist Heat;Therapeutic activities;Therapeutic exercise;Balance training;Neuromuscular re-education;Patient/family education;Passive range of motion;Manual techniques;Taping   PT Next Visit Plan  postural strength, core strength, balance exercises, address back pain if needed.  Review HEP on foam roll   PT Home Exercise Plan progress as needed   Consulted and Agree with Plan of Care Patient      Patient will benefit from skilled therapeutic intervention in order to improve the following deficits and impairments:  Decreased activity tolerance, Decreased strength,  Postural dysfunction, Improper body mechanics, Impaired flexibility, Decreased range of motion, Decreased endurance, Pain  Visit Diagnosis: Muscle weakness (generalized)  Chronic left-sided low back pain without sciatica  Abnormal posture     Problem List Patient Active Problem List   Diagnosis Date Noted  . Tremor, essential 12/03/2015  . Essential hypertension 12/09/2014  . Coronary artery disease involving native  heart 12/08/2013  . 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[place]) 12/08/2013  . Hyperlipidemia 12/08/2013  . Benign prostatic hypertrophy 08/08/2013  . Essential and other specified forms of tremor 11/25/2012    Earlie Counts, PT 09/26/16 9:49 AM   [facility] [address], [place], [place], [address] Phone: [phone]   Fax:  [phone]  Name: Cole Martinez MRN: [id number] Date of Birth: Sep 30, 1945

## 2016-09-27 ENCOUNTER — Ambulatory Visit: Payer: Medicare Other | Admitting: Physical Therapy

## 2016-09-27 ENCOUNTER — Encounter: Payer: Self-pay | Admitting: Physical Therapy

## 2016-09-27 DIAGNOSIS — M6281 Muscle weakness (generalized): Secondary | ICD-10-CM | POA: Diagnosis not present

## 2016-09-27 DIAGNOSIS — M545 Low back pain: Secondary | ICD-10-CM

## 2016-09-27 DIAGNOSIS — G8929 Other chronic pain: Secondary | ICD-10-CM

## 2016-09-27 DIAGNOSIS — R293 Abnormal posture: Secondary | ICD-10-CM

## 2016-09-27 NOTE — Therapy (Signed)
[facility] [address] [place], [place], [address] Phone: [phone]   Fax:  [phone]  Physical Therapy Treatment  Patient Details  Name: Cole Martinez MRN: [id number] Date of Birth: Jan 21, 1946 Referring Provider: Delrae Rend, MD   Encounter Date: 09/27/2016      PT End of Session - 09/27/16 1104    Visit Number 7   Number of Visits 10   Date for PT Re-Evaluation 10/25/16   Authorization Type medicare g-code on 10th visit   PT Start Time 1100   PT Stop Time 1140   PT Time Calculation (min) 40 min   Activity Tolerance Patient tolerated treatment well   Behavior During Therapy [facility] for tasks assessed/performed      Past Medical History:  Diagnosis Date  . ADHD (attention deficit hyperactivity disorder)   . B12 deficiency   . BPH (benign prostatic hypertrophy)   . 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 Coronary artery disease CARDIOLOGIST- DR Daneen Schick   S/P STENTING LAD 1999  . Crohn's disease of ileum ([place]) SINCE 1988  . ED (erectile dysfunction)   . Elevated PSA   . Essential and other specified forms of tremor 11/25/2012  . H/O adenomatous polyp of colon   . Hyperlipidemia   . Nocturia   . OA (osteoarthritis)   . Peripheral neuropathy    hx of, none current as of 08-04-13  . Peyronie disease   . S/P coronary artery stent placement OCT 1999 OF LAD  . Tremor, hereditary, benign MILD RIGHT HAND    Past Surgical History:  Procedure Laterality Date  . CARDIOVASCULAR STRESS TEST  11-01-2010 DR Daneen Schick   NORMAL PERFUSION STUDY/ EF 64%/ NO ISCHEMIA  . COLONOSCOPY WITH PROPOFOL N/A 09/24/2012   Procedure: COLONOSCOPY WITH PROPOFOL;  Surgeon: [name], MD;  [facility];  Service: Endoscopy;  Laterality: N/A;  . CORONARY ANGIOPLASTY WITH STENT PLACEMENT  OCT 1999   STENT  OF LAD  . LAPAROSCOPIC INGUINAL HERNIA REPAIR Bilateral 01-10-2004   W/ MESH  . neck benign removed from neck  yrs ago  . PROSTATE BIOPSY N/A 07/12/2012   Procedure: PROSTATE BIOPSY AND ULTRASOUND;  Surgeon: Ailene Rud, MD;  Location: [facility];  Service: Urology;  Laterality: N/A;  . PROSTATE SURGERY  2002   tuna  . REMOVAL LEFT NECK LYMPH NODE  1996  . TONSILLECTOMY  CHILD  . TRANSURETHRAL RESECTION OF PROSTATE N/A 08/08/2013   Procedure: TRANSURETHRAL RESECTION OF THE PROSTATE WITH GYRUS INSTRUMENTS;  Surgeon: Ailene Rud, MD;  [facility];  Service: Urology;  Laterality: N/A;  . TRANSURETHRAL RESECTION OF PROSTATE      There were no vitals filed for this visit.      Subjective Assessment - 09/27/16 1103    Subjective I am feeling good. I walked today.    Pertinent History osteoporosis: t score -1.5, considering Rt knee replacement   How long can you stand comfortably? limited by knee pain   How long can you walk comfortably? limited by knee pain   Diagnostic tests OA in Rt hip, no imaging of low back.  Osteoporosis    Patient Stated Goals return to walking, improve posture   Currently in Pain? Yes   Pain Score 6    Pain Location Back   Pain Orientation Lower  Pain Descriptors / Indicators Aching;Sharp   Pain Type Chronic pain   Pain Frequency Intermittent   Aggravating Factors  standing and walking   Pain Relieving Factors meds   Multiple Pain Sites No                         [facility] Adult PT Treatment/Exercise - 09/27/16 0001      Neck Exercises: Machines for Strengthening   UBE (Upper Arm Bike) Level 3 x 6 (3/3)  PT present to discuss progress     Lumbar Exercises: Supine   Bent Knee Raise 20 reps  with tactile cues to contract abdominals   Bent Knee Raise Limitations did 10 on foam roll but had difficulty with control   Other Supine Lumbar Exercises on foam roll: decompression x 3 minutes with abdominal  bracing.  Supine theraband on foam roll: D2, horizontal abduction, ER and overhead flexion with band.  Red.                PT Education - 09/27/16 1139    Education provided Yes   Education Details balance exercises   Person(s) Educated Patient   Methods Explanation;Demonstration;Verbal cues;Handout   Comprehension Verbalized understanding;Returned demonstration          PT Short Term Goals - 09/25/16 1024      PT SHORT TERM GOAL #4   Title report a 25% reduction in LBP with home tasks   Baseline 5% improved; no hip pain now   Time 4   Period Weeks   Status On-going           PT Long Term Goals - 09/27/16 1119      PT LONG TERM GOAL #4   Title report no falls at home or in the community and 50% improved confidence with balance   Time 8   Period Weeks   Status On-going  has not fallen yet; 20% improved confidence               Plan - 09/27/16 1105    Clinical Impression Statement Patient has difficulty with contracting his lower abdominals while on the foam roll. Patient needs cues for balance for single leg stance.  Patient has not fallen since initial evaluation and has 20% increase in confidence.  Patient will benefit from skilled therapy to improve strength, endurance, postural alignment and weightbearing activity.    Rehab Potential Good   PT Frequency 2x / week   PT Duration 8 weeks   PT Treatment/Interventions ADLs/Self Care Home Management;Cryotherapy;Electrical Stimulation;Functional mobility training;Gait training;Moist Heat;Therapeutic activities;Therapeutic exercise;Balance training;Neuromuscular re-education;Patient/family education;Passive range of motion;Manual techniques;Taping   PT Next Visit Plan  postural strength, core strength, balance exercises, address back pain if needed.     PT Home Exercise Plan progress as needed   Consulted and Agree with Plan of Care Patient      Patient will benefit from skilled therapeutic intervention  in order to improve the following deficits and impairments:  Decreased activity tolerance, Decreased strength, Postural dysfunction, Improper body mechanics, Impaired flexibility, Decreased range of motion, Decreased endurance, Pain  Visit Diagnosis: Muscle weakness (generalized)  Chronic left-sided low back pain without sciatica  Abnormal posture     Problem List Patient Active Problem List   Diagnosis Date Noted  . Tremor, essential 12/03/2015  . Essential hypertension 12/09/2014  . Coronary artery disease involving native heart 12/08/2013  . 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[place]) 12/08/2013  . Hyperlipidemia 12/08/2013  . Benign  prostatic hypertrophy 08/08/2013  . Essential and other specified forms of tremor 11/25/2012    Earlie Counts, PT 09/27/16 11:42 AM   [facility] [address], [place], [place], [address] Phone: [phone]   Fax:  [phone]  Name: Cole Martinez MRN: [id number] Date of Birth: 06-Aug-1945

## 2016-09-27 NOTE — Patient Instructions (Addendum)
With Support    Stand on one leg in neutral spine holding support. Hold _15_ seconds. Repeat on other leg. Do __3__ repetitions, ___1_ sets. To progress yourself close eyes, next progression would be a pillow under your foot. http://bt.exer.us/34   Copyright  VHI. All rights reserved.  Tandem Stance    Right foot in front of left, heel touching toe both feet "straight ahead". Stand on Foot Triangle of Support with both feet. Balance in this position __30_ seconds. Do with left foot in front of right. Progression is with eyes closed, next progression is on pillow Copyright  VHI. All rights reserved.  Tandem Walking    Walk with each foot directly in front of other, heel of one foot touching toes of other foot with each step. Both feet straight ahead. For 8 feet 2 times 1 time per day.    Copyright  VHI. All rights reserved.  Stand on one leg and reach down to chair like a golfers lift and one hand on counter 10 each leg 1 time per day.   [place] [address], [place], [place] [address] Phone # [phone] Fax [phone]

## 2016-09-28 ENCOUNTER — Other Ambulatory Visit: Payer: Self-pay | Admitting: Interventional Cardiology

## 2016-10-02 ENCOUNTER — Ambulatory Visit: Payer: Medicare Other

## 2016-10-02 DIAGNOSIS — M6281 Muscle weakness (generalized): Secondary | ICD-10-CM

## 2016-10-02 DIAGNOSIS — M545 Low back pain: Secondary | ICD-10-CM

## 2016-10-02 DIAGNOSIS — G8929 Other chronic pain: Secondary | ICD-10-CM

## 2016-10-02 DIAGNOSIS — R293 Abnormal posture: Secondary | ICD-10-CM | POA: Diagnosis not present

## 2016-10-02 NOTE — Therapy (Signed)
[facility] [address] [place], [place], [address] Phone: [phone]   Fax:  [phone]  Physical Therapy Treatment  Patient Details  Name: Cole Martinez MRN: [id number] Date of Birth: 1945-05-19 Referring Provider: Delrae Rend, MD   Encounter Date: 10/02/2016      PT End of Session - 10/02/16 1142    Visit Number 8   Number of Visits 10   Date for PT Re-Evaluation 10/25/16   Authorization Type medicare g-code on 10th visit   PT Start Time 1059   PT Stop Time 1142   PT Time Calculation (min) 43 min   Activity Tolerance Patient tolerated treatment well   Behavior During Therapy [facility] for tasks assessed/performed      Past Medical History:  Diagnosis Date  . ADHD (attention deficit hyperactivity disorder)   . B12 deficiency   . BPH (benign prostatic hypertrophy)   . 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 Coronary artery disease CARDIOLOGIST- DR Daneen Schick   S/P STENTING LAD 1999  . Crohn's disease of ileum ([place]) SINCE 1988  . ED (erectile dysfunction)   . Elevated PSA   . Essential and other specified forms of tremor 11/25/2012  . H/O adenomatous polyp of colon   . Hyperlipidemia   . Nocturia   . OA (osteoarthritis)   . Peripheral neuropathy    hx of, none current as of 08-04-13  . Peyronie disease   . S/P coronary artery stent placement OCT 1999 OF LAD  . Tremor, hereditary, benign MILD RIGHT HAND    Past Surgical History:  Procedure Laterality Date  . CARDIOVASCULAR STRESS TEST  11-01-2010 DR Daneen Schick   NORMAL PERFUSION STUDY/ EF 64%/ NO ISCHEMIA  . COLONOSCOPY WITH PROPOFOL N/A 09/24/2012   Procedure: COLONOSCOPY WITH PROPOFOL;  Surgeon: [name], MD;  [facility];  Service: Endoscopy;  Laterality: N/A;  . CORONARY ANGIOPLASTY WITH STENT PLACEMENT  OCT 1999   STENT  OF LAD  . LAPAROSCOPIC INGUINAL HERNIA REPAIR Bilateral 01-10-2004   W/ MESH  . neck benign removed from neck  yrs ago  . PROSTATE BIOPSY N/A 07/12/2012   Procedure: PROSTATE BIOPSY AND ULTRASOUND;  Surgeon: Ailene Rud, MD;  Location: [facility];  Service: Urology;  Laterality: N/A;  . PROSTATE SURGERY  2002   tuna  . REMOVAL LEFT NECK LYMPH NODE  1996  . TONSILLECTOMY  CHILD  . TRANSURETHRAL RESECTION OF PROSTATE N/A 08/08/2013   Procedure: TRANSURETHRAL RESECTION OF THE PROSTATE WITH GYRUS INSTRUMENTS;  Surgeon: Ailene Rud, MD;  [facility];  Service: Urology;  Laterality: N/A;  . TRANSURETHRAL RESECTION OF PROSTATE      There were no vitals filed for this visit.      Subjective Assessment - 10/02/16 1131    Subjective I really want to work on my balance.  My posture is better.     Currently in Pain? Yes   Pain Score 1    Pain Location Back   Pain Orientation Lower   Pain Descriptors / Indicators Aching;Sharp   Pain Type Chronic pain   Pain Onset More than a month ago   Pain Frequency Intermittent   Aggravating Factors  standing and walking   Pain Relieving Factors medication  [place] Adult PT Treatment/Exercise - 10/02/16 0001      Exercises   Exercises Knee/Hip     Neck Exercises: Machines for Strengthening   UBE (Upper Arm Bike) Level 3 x 6 (3/3)  PT present to discuss progress     Knee/Hip Exercises: Standing   Heel Raises Both;2 sets;10 reps   Hip Abduction Stengthening;Both;2 sets;10 reps   Hip Extension Stengthening;Both;2 sets;10 reps   Forward Step Up Limitations alternating step taps on 6" step 2x10   Rebounder weight shifting 3 ways x 1 minute each   Walking with Sports Cord 25# forward and reverse x 10 each   Other Standing Knee Exercises sit to stand with red ball: 2x10                PT Education - 10/02/16 1140    Education provided Yes   Education Details  standing hip exercises   Person(s) Educated Patient   Methods Explanation;Demonstration   Comprehension Verbalized understanding;Returned demonstration          PT Short Term Goals - 10/02/16 1132      PT SHORT TERM GOAL #4   Title report a 25% reduction in LBP with home tasks   Time 4   Period Weeks   Status On-going           PT Long Term Goals - 10/02/16 1133      PT LONG TERM GOAL #3   Title report a 50% reduction in LBP with home tasks   Time 8   Period Weeks   Status On-going     PT LONG TERM GOAL #4   Title report no falls at home or in the community and 50% improved confidence with balance   Time 8   Period Weeks   Status On-going               Plan - 10/02/16 1133    Clinical Impression Statement Session focused on balance today with emphasis on core activation and posture.  Pt requires cures for posture at times.  Pt tolerated balance exercise progression and required cueing for more challenging tasks.  Pt will benefit from skilled PT for strength, posture, endurance and gait.     Rehab Potential Good   PT Frequency 2x / week   PT Duration 8 weeks   PT Next Visit Plan  postural strength, core strength, balance exercises, address back pain if needed.     Consulted and Agree with Plan of Care Patient      Patient will benefit from skilled therapeutic intervention in order to improve the following deficits and impairments:  Decreased activity tolerance, Decreased strength, Postural dysfunction, Improper body mechanics, Impaired flexibility, Decreased range of motion, Decreased endurance, Pain  Visit Diagnosis: Muscle weakness (generalized)  Chronic left-sided low back pain without sciatica  Abnormal posture     Problem List Patient Active Problem List   Diagnosis Date Noted  . Tremor, essential 12/03/2015  . Essential hypertension 12/09/2014  . Coronary artery disease involving native heart 12/08/2013  . Malignant lymphoma-small cell  ([place]) 12/08/2013  . Hyperlipidemia 12/08/2013  . Benign prostatic hypertrophy 08/08/2013  . Essential and other specified forms of tremor 11/25/2012     Sigurd Sos, PT 10/02/16 11:46 AM  [facility] [address] [place], [place], [address] Phone: [phone]   Fax:  [phone]  Name: Cole Martinez MRN: [id number] Date of Birth: [birth year]

## 2016-10-02 NOTE — Patient Instructions (Addendum)
   ABDUCTION: Standing (Active)   Stand, feet flat. Lift right leg out to side. Use _0__ lbs. Complete __10_ repetitions. Perform __2_ sessions per day.   EXTENSION: Standing (Active)  Stand, both feet flat. Draw right leg behind body as far as possible. Use 0___ lbs. Complete 10 repetitions. Perform __2_ sessions per day.  Copyright  VHI. All rights reserved.   Knee Extension: Sit to Stand (Eccentric)    Stand close to chair. Slowly lower self to seated position. _10__ reps per set, __2_ sets per day, ___ days per week. Progress to stopping midway before lowering to chair. Progress to barely touching chair.  Copyright  VHI. All rights reserved.   Heel Raise: Bilateral (Standing)    Rise on balls of feet. Repeat _10___ times per set. Do _2___ sets per session. Do __2__ sessions per day.  http://orth.exer.us/38   Copyright  VHI. All rights reserved.   [place] [address], [place] [address] [place], [place] [address] Phone # [phone] Fax [phone]

## 2016-10-10 ENCOUNTER — Ambulatory Visit: Payer: Medicare Other | Attending: Internal Medicine

## 2016-10-10 DIAGNOSIS — R293 Abnormal posture: Secondary | ICD-10-CM | POA: Diagnosis not present

## 2016-10-10 DIAGNOSIS — M6281 Muscle weakness (generalized): Secondary | ICD-10-CM | POA: Diagnosis not present

## 2016-10-10 DIAGNOSIS — M545 Low back pain: Secondary | ICD-10-CM | POA: Diagnosis not present

## 2016-10-10 DIAGNOSIS — G8929 Other chronic pain: Secondary | ICD-10-CM | POA: Insufficient documentation

## 2016-10-10 NOTE — Therapy (Signed)
[facility] [address] [place], [place], [address] Phone: [phone]   Fax:  [phone]  Physical Therapy Treatment  Patient Details  Name: Cole Martinez MRN: [id number] Date of Birth: 10-25-1945 Referring Provider: Delrae Rend, MD   Encounter Date: 10/10/2016      PT End of Session - 10/10/16 0937    Visit Number 9   Number of Visits 10   Date for PT Re-Evaluation 10/25/16   Authorization Type medicare g-code on 10th visit   PT Start Time 0848   PT Stop Time 0929   PT Time Calculation (min) 41 min   Activity Tolerance Patient tolerated treatment well   Behavior During Therapy [facility] for tasks assessed/performed      Past Medical History:  Diagnosis Date  . ADHD (attention deficit hyperactivity disorder)   . B12 deficiency   . BPH (benign prostatic hypertrophy)   . 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 Coronary artery disease CARDIOLOGIST- DR Daneen Schick   S/P STENTING LAD 1999  . Crohn's disease of ileum ([place]) SINCE 1988  . ED (erectile dysfunction)   . Elevated PSA   . Essential and other specified forms of tremor 11/25/2012  . H/O adenomatous polyp of colon   . Hyperlipidemia   . Nocturia   . OA (osteoarthritis)   . Peripheral neuropathy    hx of, none current as of 08-04-13  . Peyronie disease   . S/P coronary artery stent placement OCT 1999 OF LAD  . Tremor, hereditary, benign MILD RIGHT HAND    Past Surgical History:  Procedure Laterality Date  . CARDIOVASCULAR STRESS TEST  11-01-2010 DR Daneen Schick   NORMAL PERFUSION STUDY/ EF 64%/ NO ISCHEMIA  . COLONOSCOPY WITH PROPOFOL N/A 09/24/2012   Procedure: COLONOSCOPY WITH PROPOFOL;  Surgeon: [name], MD;  [facility];  Service: Endoscopy;  Laterality: N/A;  . CORONARY ANGIOPLASTY WITH STENT PLACEMENT  OCT 1999   STENT  OF LAD  . LAPAROSCOPIC INGUINAL HERNIA REPAIR Bilateral 01-10-2004   W/ MESH  . neck benign removed from neck  yrs ago  . PROSTATE BIOPSY N/A 07/12/2012   Procedure: PROSTATE BIOPSY AND ULTRASOUND;  Surgeon: Ailene Rud, MD;  Location: [facility];  Service: Urology;  Laterality: N/A;  . PROSTATE SURGERY  2002   tuna  . REMOVAL LEFT NECK LYMPH NODE  1996  . TONSILLECTOMY  CHILD  . TRANSURETHRAL RESECTION OF PROSTATE N/A 08/08/2013   Procedure: TRANSURETHRAL RESECTION OF THE PROSTATE WITH GYRUS INSTRUMENTS;  Surgeon: Ailene Rud, MD;  [facility];  Service: Urology;  Laterality: N/A;  . TRANSURETHRAL RESECTION OF PROSTATE      There were no vitals filed for this visit.      Subjective Assessment - 10/10/16 0905    Subjective I am still working on my balance.  I am more aware of my posture.     Pertinent History osteoporosis: t score -1.5, considering Rt knee replacement   Currently in Pain? Yes   Pain Score 3    Pain Location Back   Pain Orientation Lower   Pain Descriptors / Indicators Aching;Sharp   Pain Type Chronic pain   Pain Onset More than a month ago   Pain Frequency Intermittent   Aggravating Factors  standing and walking   Pain Relieving Factors pain meds  [facility] PT Assessment - 10/10/16 0001      Observation/Other Assessments   Focus on Therapeutic Outcomes (FOTO)  31% limitation                     [facility] Adult PT Treatment/Exercise - 10/10/16 0001      Neck Exercises: Machines for Strengthening   UBE (Upper Arm Bike) Level 3 x 6 (3/3)  PT present to discuss progress     Knee/Hip Exercises: Standing   Heel Raises Both;2 sets;10 reps   Hip Abduction Stengthening;Both;2 sets;10 reps   Hip Extension Stengthening;Both;2 sets;10 reps   Forward Step Up Limitations alternating step taps on 6" step 2x10   Step Down Both;3 sets;10 reps   Rebounder weight shifting 3 ways x 1 minute each   Other Standing  Knee Exercises sit to stand with red ball: 3x10     Shoulder Exercises: Seated   Flexion Strengthening;Both;20 reps;Weights   Flexion Weight (lbs) 2   Abduction Strengthening;20 reps;Weights   ABduction Weight (lbs) 2   ABduction Limitations scaption 2# 2x10                  PT Short Term Goals - 10/10/16 0907      PT SHORT TERM GOAL #4   Title report a 25% reduction in LBP with home tasks   Baseline 5% improved; no hip pain now   Time 4   Period Weeks   Status On-going           PT Long Term Goals - 10/10/16 0907      PT LONG TERM GOAL #1   Title be independent in advanced HEP   Time 8   Period Weeks   Status On-going     PT LONG TERM GOAL #3   Title report a 50% reduction in LBP with home tasks   Time 8   Period Weeks   Status On-going     PT LONG TERM GOAL #4   Title report no falls at home or in the community and 50% improved confidence with balance   Time 8   Period Weeks   Status On-going               Plan - 10/10/16 4503    Clinical Impression Statement Pt with improved postural awareness and is able to make corrections with exercise with fewer verbal cues.  Pt is continuing to work on balance at home and in the gym with exercise.  Pt with continued LBP that is unchanged with therapy.     Rehab Potential Good   PT Frequency 2x / week   PT Duration 8 weeks   PT Treatment/Interventions ADLs/Self Care Home Management;Cryotherapy;Electrical Stimulation;Functional mobility training;Gait training;Moist Heat;Therapeutic activities;Therapeutic exercise;Balance training;Neuromuscular re-education;Patient/family education;Passive range of motion;Manual techniques;Taping   PT Next Visit Plan G-codes next, balance, core strength, postural strength   Consulted and Agree with Plan of Care Patient      Patient will benefit from skilled therapeutic intervention in order to improve the following deficits and impairments:  Decreased activity tolerance,  Decreased strength, Postural dysfunction, Improper body mechanics, Impaired flexibility, Decreased range of motion, Decreased endurance, Pain  Visit Diagnosis: Muscle weakness (generalized)  Chronic left-sided low back pain without sciatica  Abnormal posture     Problem List Patient Active Problem List   Diagnosis Date Noted  . Tremor, essential 12/03/2015  . Essential hypertension 12/09/2014  . Coronary artery disease involving native heart 12/08/2013  . Malignant  lymphoma-small cell ([place]) 12/08/2013  . Hyperlipidemia 12/08/2013  . Benign prostatic hypertrophy 08/08/2013  . Essential and other specified forms of tremor 11/25/2012     Sigurd Sos, PT 10/10/16 9:40 AM  [facility] [address], [place], [place], [address] Phone: [phone]   Fax:  [phone]  Name: Cole Martinez MRN: [id number] Date of Birth: 09/12/1945

## 2016-10-13 ENCOUNTER — Encounter: Payer: Self-pay | Admitting: Rehabilitation

## 2016-10-13 ENCOUNTER — Ambulatory Visit: Payer: Medicare Other | Admitting: Rehabilitation

## 2016-10-13 DIAGNOSIS — R293 Abnormal posture: Secondary | ICD-10-CM | POA: Diagnosis not present

## 2016-10-13 DIAGNOSIS — M545 Low back pain: Secondary | ICD-10-CM | POA: Diagnosis not present

## 2016-10-13 DIAGNOSIS — G8929 Other chronic pain: Secondary | ICD-10-CM | POA: Diagnosis not present

## 2016-10-13 DIAGNOSIS — M6281 Muscle weakness (generalized): Secondary | ICD-10-CM

## 2016-10-13 IMAGING — RF DG UGI W/ SMALL BOWEL
18 of 24 series · 18 of 24 positions shown · non-contrast
Comparison: CT abdomen pelvis of 11/03/2013

CLINICAL DATA: History of Crohn's ileitis
TECHNIQUE: Combined double contrast and single contrast upper GI series using
effervescent crystals, thick barium, and thin barium. Subsequently,
serial images of the small bowel were obtained including spot views
of the terminal ileum.

[Series 3: run · 1 of 1 slices shown (1 of 18)]
[im 1/1]
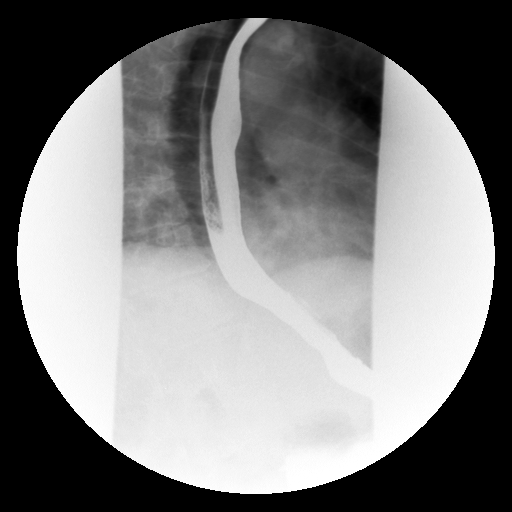

[Series 5: run · 1 of 1 slices shown (2 of 18)]
[im 1/1]
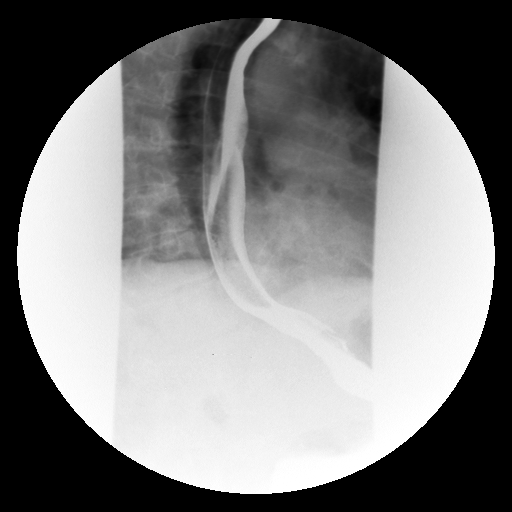

[Series 6: run · 1 of 1 slices shown (3 of 18)]
[im 1/1]
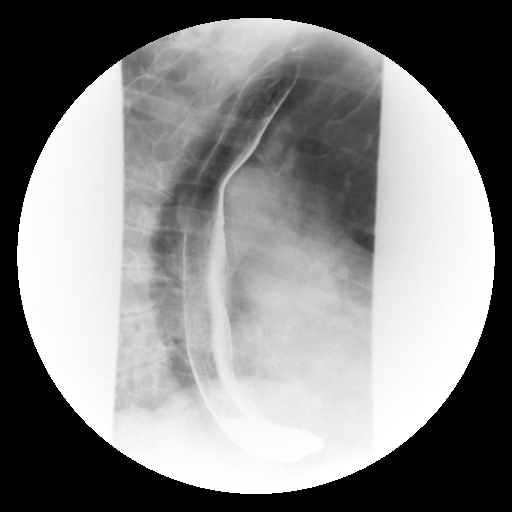

[Series 7: run · 1 of 1 slices shown (4 of 18)]
[im 1/1]
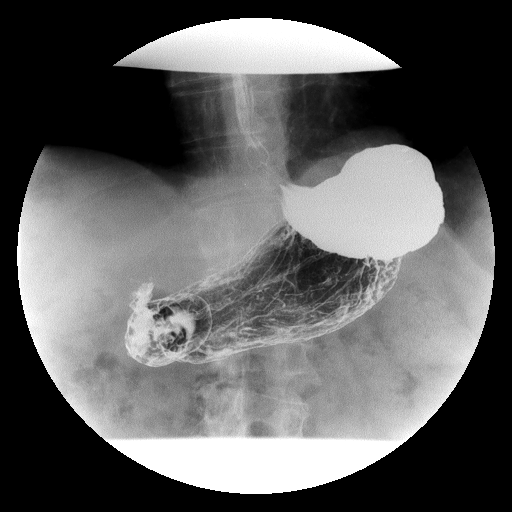

[Series 9: run · 1 of 1 slices shown (5 of 18)]
[im 1/1]
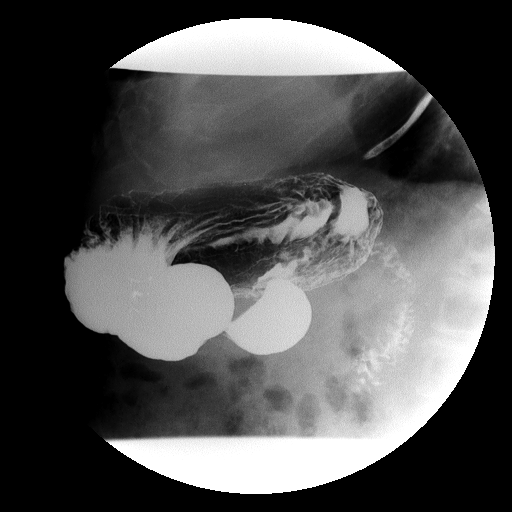

[Series 10: run · 1 of 1 slices shown (6 of 18)]
[im 1/1]
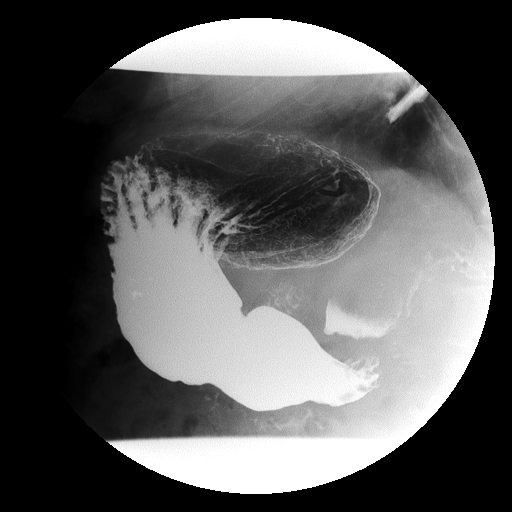

[Series 11: run · 1 of 1 slices shown (7 of 18)]
[im 1/1]
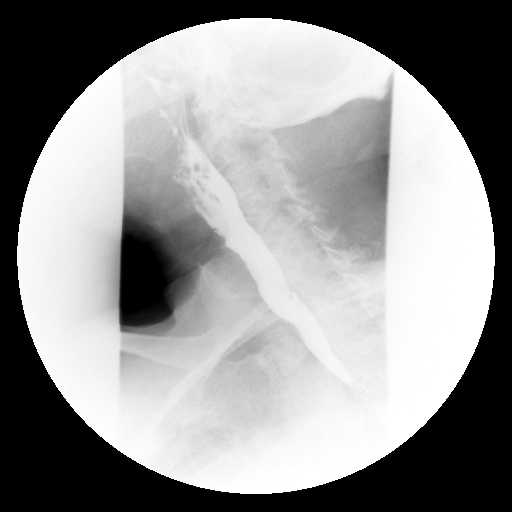

[Series 13: run · 1 of 1 slices shown (8 of 18)]
[im 1/1]
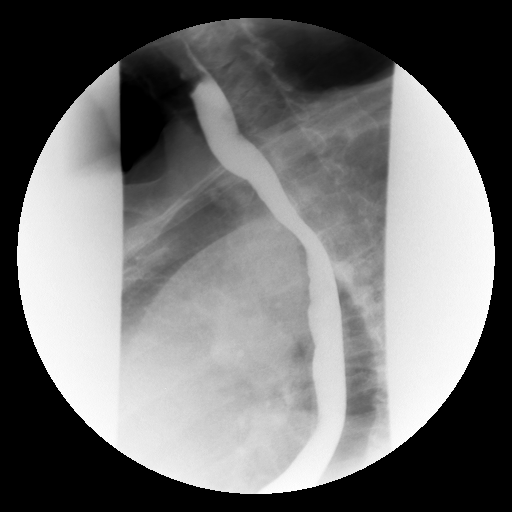

[Series 14: run · 1 of 1 slices shown (9 of 18)]
[im 1/1]
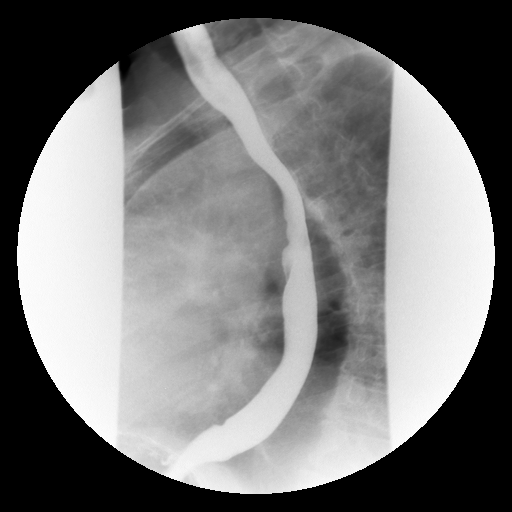

[Series 15: run · 1 of 1 slices shown (10 of 18)]
[im 1/1]
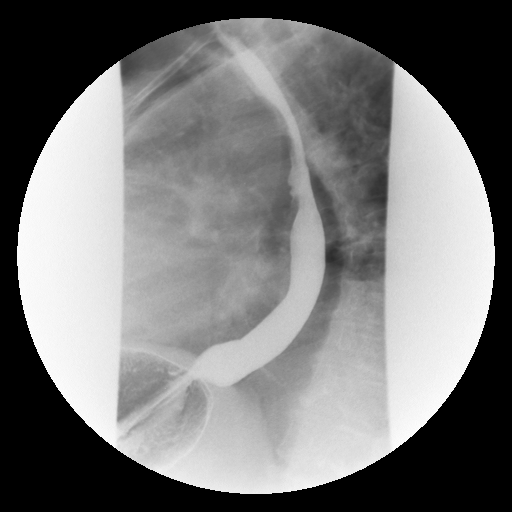

[Series 17: run · 1 of 1 slices shown (11 of 18)]
[im 1/1]
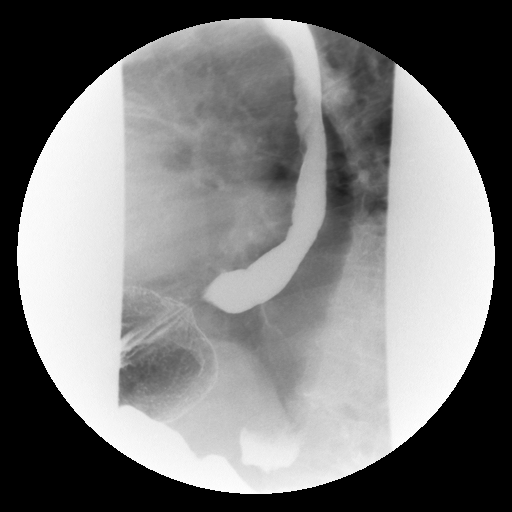

[Series 18: run · 1 of 1 slices shown (12 of 18)]
[im 1/1]
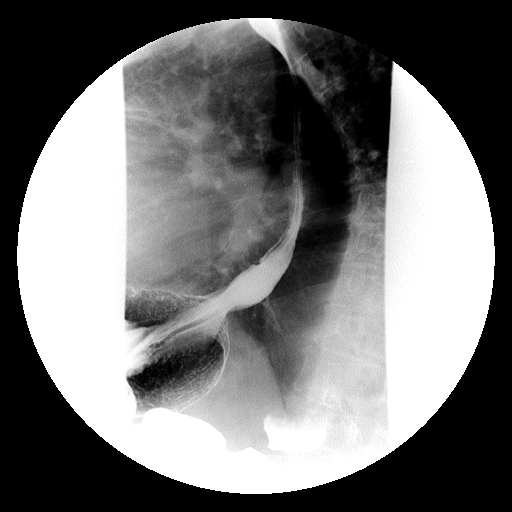

[Series 19: run · 1 of 1 slices shown (13 of 18)]
[im 1/1]
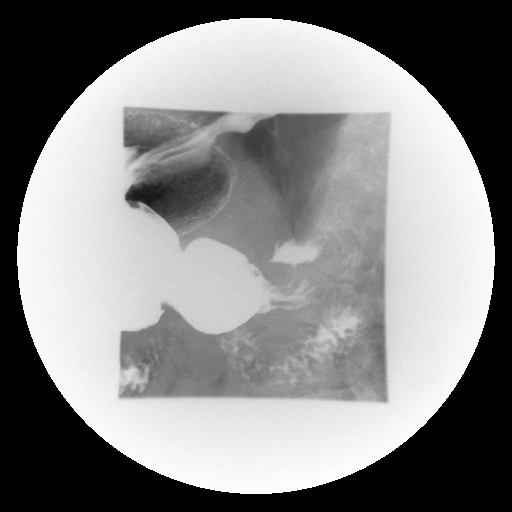

[Series 21: run · 1 of 1 slices shown (14 of 18)]
[im 1/1]
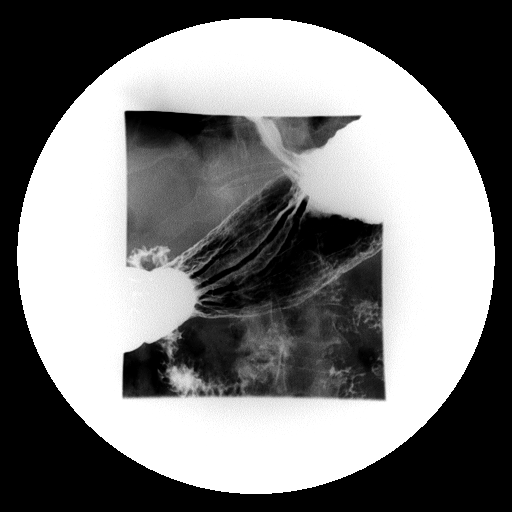

[Series 22: run · 1 of 1 slices shown (15 of 18)]
[im 1/1]
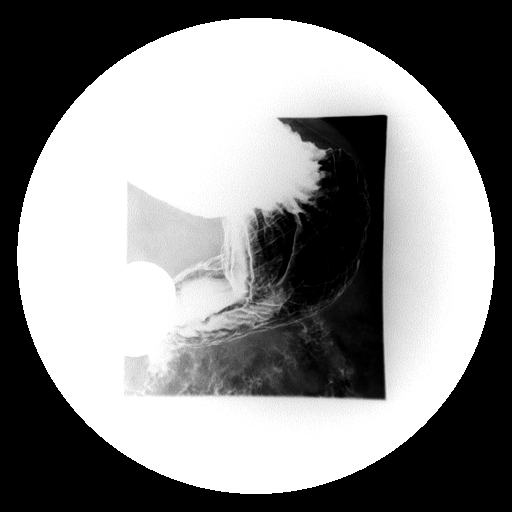

[Series 23: run · 1 of 1 slices shown (16 of 18)]
[im 1/1]
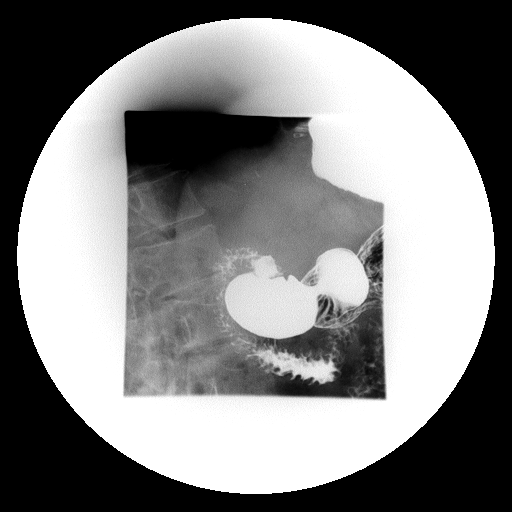

[Series 25: run · 1 of 1 slices shown (17 of 18)]
[im 1/1]
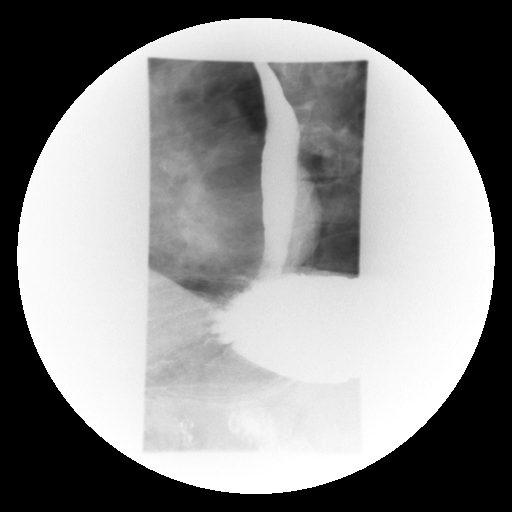

[Series 26: run · 1 of 1 slices shown (18 of 18)]
[im 1/1]
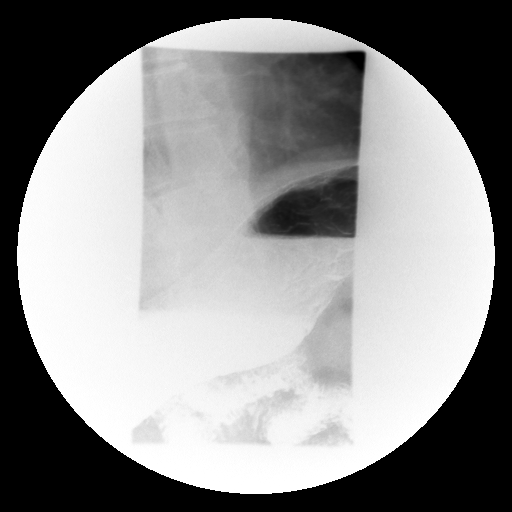

[18 of 24 positions shown; findings below may reference images not displayed]

EXAM:
UPPER GI SERIES WITH SMALL BOWEL FOLLOW-THROUGH

FLUOROSCOPY TIME:  Radiation Exposure Index (as provided by the
fluoroscopic device): 205 Gy per sq cm

If the device does not provide the exposure index:

Fluoroscopy Time (in minutes and seconds):  3 minutes 54 seconds

Number of Acquired Images:
FINDINGS: A preliminary film of the abdomen shows a lumbar scoliosis convex to
the left with associated degenerative change. The bowel gas pattern
is nonspecific. Surgical coils are noted due to mesh in the lower
pelvis.

Initially a double-contrast upper GI was performed. The mucosa of
the esophagus is unremarkable. A single contrast study shows the
swallowing mechanism to be normal. Esophageal peristalsis is normal.
No definite hiatal hernia is seen. Moderate gastroesophageal reflux
is demonstrated. A barium pill was given which did pass into the
stomach without delay.

The stomach is normal in contour and peristalsis. The duodenal bulb
fills and the duodenal loop is in normal position.

The patient was given additional barium orally and images of the
small bowel were obtained. The mucosal pattern of the small bowel
appears normal. No definite edema, mass, or dilatation of small
bowel is seen. However, within the terminal ileum there is a short
segment area of narrowing with a small collection of barium. This
probably is deformity due to prior infection although a short
segment focus of active inflammation cannot be excluded. If further
assessment is warranted, CT enterography would be more sensitive in
evaluating for activity of Crohn's disease. No dilatation of the
more proximal small bowel is seen to indicate a significant terminal
ileum stenosis.
IMPRESSION: 1. Moderate gastroesophageal reflux. Barium pill passes into the
stomach without delay.
2. Short segment narrowing of the terminal ileum with collection of
barium possibly representing deformity from prior ulcer disease
although an active component cannot be excluded currently. No
dilatation of the more proximal small bowel is seen to indicate a
significant stenosis. Consider CT enterography for more sensitive
assessment in the future.
3. No abnormality of the stomach or duodenum is seen.

## 2016-10-13 NOTE — Therapy (Signed)
[facility] [address], [place], [place], [address] Phone: [phone]   Fax:  [phone]  Physical Therapy Treatment  Patient Details  Name: Cole Martinez MRN: [id number] Date of Birth: 1945/10/27 Referring Provider: Delrae Rend, MD   Encounter Date: 10/13/2016      PT End of Session - 10/13/16 1036    Visit Number 10   Number of Visits 20   Date for PT Re-Evaluation 10/25/16   Authorization Type medicare g-code on 10th visit   PT Start Time 1015   PT Stop Time 1057   PT Time Calculation (min) 42 min   Activity Tolerance Patient tolerated treatment well      Past Medical History:  Diagnosis Date  . ADHD (attention deficit hyperactivity disorder)   . B12 deficiency   . BPH (benign prostatic hypertrophy)   . 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 Coronary artery disease CARDIOLOGIST- DR Daneen Schick   S/P STENTING LAD 1999  . Crohn's disease of ileum ([place]) SINCE 1988  . ED (erectile dysfunction)   . Elevated PSA   . Essential and other specified forms of tremor 11/25/2012  . H/O adenomatous polyp of colon   . Hyperlipidemia   . Nocturia   . OA (osteoarthritis)   . Peripheral neuropathy    hx of, none current as of 08-04-13  . Peyronie disease   . S/P coronary artery stent placement OCT 1999 OF LAD  . Tremor, hereditary, benign MILD RIGHT HAND    Past Surgical History:  Procedure Laterality Date  . CARDIOVASCULAR STRESS TEST  11-01-2010 DR Daneen Schick   NORMAL PERFUSION STUDY/ EF 64%/ NO ISCHEMIA  . COLONOSCOPY WITH PROPOFOL N/A 09/24/2012   Procedure: COLONOSCOPY WITH PROPOFOL;  Surgeon: [name], MD;  [facility];  Service: Endoscopy;  Laterality: N/A;  . CORONARY ANGIOPLASTY WITH STENT PLACEMENT  OCT 1999   STENT OF LAD  . LAPAROSCOPIC INGUINAL HERNIA REPAIR Bilateral  01-10-2004   W/ MESH  . neck benign removed from neck  yrs ago  . PROSTATE BIOPSY N/A 07/12/2012   Procedure: PROSTATE BIOPSY AND ULTRASOUND;  Surgeon: Ailene Rud, MD;  Location: [facility];  Service: Urology;  Laterality: N/A;  . PROSTATE SURGERY  2002   tuna  . REMOVAL LEFT NECK LYMPH NODE  1996  . TONSILLECTOMY  CHILD  . TRANSURETHRAL RESECTION OF PROSTATE N/A 08/08/2013   Procedure: TRANSURETHRAL RESECTION OF THE PROSTATE WITH GYRUS INSTRUMENTS;  Surgeon: Ailene Rud, MD;  [facility];  Service: Urology;  Laterality: N/A;  . TRANSURETHRAL RESECTION OF PROSTATE      There were no vitals filed for this visit.      Subjective Assessment - 10/13/16 1017    Subjective my back was killing me this morning but I went to the gym and now it feels better   Currently in Pain? Yes   Pain Score 1    Pain Location Back   Pain Orientation Lower   Pain Descriptors / Indicators Aching   Aggravating Factors  standing and walking   Pain Relieving Factors pain meds                         [facility] Adult PT Treatment/Exercise - 10/13/16 0001      Neck Exercises: Machines for Strengthening  UBE (Upper Arm Bike) Level 3 x 6 (3/3)     Knee/Hip Exercises: Standing   Heel Raises Both;2 sets;10 reps   Heel Raises Limitations no hands and on foam pad   Hip Abduction Stengthening;Both;2 sets;10 reps   Abduction Limitations 2#   Hip Extension Stengthening;Both;2 sets;10 reps   Extension Limitations 2#   Forward Step Up 1 set;10 reps;Hand Hold: 0;Step Height: 6"   Forward Step Up Limitations alternating step taps on 6" step 2x10   Step Down Both;3 sets;10 reps   Rebounder weight shifting 3 ways x 1 minute each   Other Standing Knee Exercises sit to stand with red ball: 3x10     Shoulder Exercises: Standing   Other Standing Exercises standing on foam bil shoulder flexion, scaption, abduction 2# bil                  PT Short Term  Goals - 10/10/16 4008      PT SHORT TERM GOAL #4   Title report a 25% reduction in LBP with home tasks   Baseline 5% improved; no hip pain now   Time 4   Period Weeks   Status On-going           PT Long Term Goals - November 03, 2016 1058      PT LONG TERM GOAL #2   Title reduce FOTO to < or = to 27% limitation   Status On-going               Plan - 11/03/16 1052    Clinical Impression Statement Increased balance difficulty of standing UE exercises and added ankle weight to standing.  per pt reporting some thing were getting easy.  Pt is getting very independent with self program and reports he will most likely do 2 more sessions and then re-evaluate status.  He did mention wanting to try more challenging balance activities.     PT Treatment/Interventions ADLs/Self Care Home Management;Cryotherapy;Electrical Stimulation;Functional mobility training;Gait training;Moist Heat;Therapeutic activities;Therapeutic exercise;Balance training;Neuromuscular re-education;Patient/family education;Passive range of motion;Manual techniques;Taping   PT Next Visit Plan , balance, core strength, postural strength   PT Home Exercise Plan progress as needed      Patient will benefit from skilled therapeutic intervention in order to improve the following deficits and impairments:  Decreased activity tolerance, Decreased strength, Postural dysfunction, Improper body mechanics, Impaired flexibility, Decreased range of motion, Decreased endurance, Pain  Visit Diagnosis: Muscle weakness (generalized)  Chronic left-sided low back pain without sciatica  Abnormal posture       G-Codes - 11/03/16 1059    Functional Assessment Tool Used (Outpatient Only) FOTO: 31% limitation   Other PT Primary Current Status (Q7619) At least 20 percent but less than 40 percent impaired, limited or restricted   Other PT Primary Goal Status (J0932) At least 20 percent but less than 40 percent impaired, limited or  restricted      Problem List Patient Active Problem List   Diagnosis Date Noted  . Tremor, essential 12/03/2015  . Essential hypertension 12/09/2014  . Coronary artery disease involving native heart 12/08/2013  . 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[place]) 12/08/2013  . Hyperlipidemia 12/08/2013  . Benign prostatic hypertrophy 08/08/2013  . Essential and other specified forms of tremor 11/25/2012    Stark Bray, DPT, CMP Nov 03, 2016, 11:00 AM  [facility] [address] [place], [place], [address] Phone: [phone]   Fax:  [phone]  Name: Cole Martinez MRN: [id number] Date of  Birth: 1946-03-01

## 2016-10-23 ENCOUNTER — Ambulatory Visit: Payer: Medicare Other | Admitting: Rehabilitation

## 2016-10-23 ENCOUNTER — Encounter: Payer: Self-pay | Admitting: Rehabilitation

## 2016-10-23 DIAGNOSIS — M6281 Muscle weakness (generalized): Secondary | ICD-10-CM

## 2016-10-23 DIAGNOSIS — M545 Low back pain, unspecified: Secondary | ICD-10-CM

## 2016-10-23 DIAGNOSIS — R293 Abnormal posture: Secondary | ICD-10-CM | POA: Diagnosis not present

## 2016-10-23 DIAGNOSIS — G8929 Other chronic pain: Secondary | ICD-10-CM

## 2016-10-23 NOTE — Therapy (Signed)
[facility] [address], [place], [place], [address] Phone: [phone]   Fax:  [phone]  Physical Therapy Treatment  Patient Details  Name: Cole Martinez MRN: [id number] Date of Birth: 1945/07/18 Referring Provider: Delrae Rend, MD   Encounter Date: 10/23/2016      PT End of Session - 10/23/16 1002    Visit Number 11   Number of Visits 20   PT Start Time 0930   PT Stop Time 1008   PT Time Calculation (min) 38 min   Activity Tolerance Patient tolerated treatment well      Past Medical History:  Diagnosis Date  . ADHD (attention deficit hyperactivity disorder)   . B12 deficiency   . BPH (benign prostatic hypertrophy)   . 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 Coronary artery disease CARDIOLOGIST- DR Daneen Schick   S/P STENTING LAD 1999  . Crohn's disease of ileum ([place]) SINCE 1988  . ED (erectile dysfunction)   . Elevated PSA   . Essential and other specified forms of tremor 11/25/2012  . H/O adenomatous polyp of colon   . Hyperlipidemia   . Nocturia   . OA (osteoarthritis)   . Peripheral neuropathy    hx of, none current as of 08-04-13  . Peyronie disease   . S/P coronary artery stent placement OCT 1999 OF LAD  . Tremor, hereditary, benign MILD RIGHT HAND    Past Surgical History:  Procedure Laterality Date  . CARDIOVASCULAR STRESS TEST  11-01-2010 DR Daneen Schick   NORMAL PERFUSION STUDY/ EF 64%/ NO ISCHEMIA  . COLONOSCOPY WITH PROPOFOL N/A 09/24/2012   Procedure: COLONOSCOPY WITH PROPOFOL;  Surgeon: [name], MD;  [facility];  Service: Endoscopy;  Laterality: N/A;  . CORONARY ANGIOPLASTY WITH STENT PLACEMENT  OCT 1999   STENT OF LAD  . LAPAROSCOPIC INGUINAL HERNIA REPAIR Bilateral 01-10-2004   W/ MESH  . neck benign removed from neck  yrs ago  . PROSTATE BIOPSY N/A  07/12/2012   Procedure: PROSTATE BIOPSY AND ULTRASOUND;  Surgeon: Ailene Rud, MD;  Location: [facility];  Service: Urology;  Laterality: N/A;  . PROSTATE SURGERY  2002   tuna  . REMOVAL LEFT NECK LYMPH NODE  1996  . TONSILLECTOMY  CHILD  . TRANSURETHRAL RESECTION OF PROSTATE N/A 08/08/2013   Procedure: TRANSURETHRAL RESECTION OF THE PROSTATE WITH GYRUS INSTRUMENTS;  Surgeon: Ailene Rud, MD;  [facility];  Service: Urology;  Laterality: N/A;  . TRANSURETHRAL RESECTION OF PROSTATE      There were no vitals filed for this visit.      Subjective Assessment - 10/23/16 0933    Subjective no complaints today.  Beach trip was good.     Currently in Pain? Yes   Pain Score 4    Pain Location Knee   Pain Orientation Right;Left                         [facility] Adult PT Treatment/Exercise - 10/23/16 0001      Neck Exercises: Machines for Strengthening   UBE (Upper Arm Bike) level 5 (3/3)     Lumbar Exercises: Standing   Other Standing Lumbar Exercises standing hip abduction and extension, and flexion with abdominal bracing x15 2# standing on foam     Knee/Hip Exercises: Standing  Heel Raises Both;2 sets;10 reps   Heel Raises Limitations no hands and on foam pad   Forward Step Up 1 set;10 reps;Hand Hold: 1   Forward Step Up Limitations on bosu   Rebounder weight shifting 3 ways x 1 minute each no hands, and tandem stance bil 2x30" CGA   Other Standing Knee Exercises sit to stand with red ball: 3x10   Other Standing Knee Exercises lateral up and over with bosu hand hold as needed                  PT Short Term Goals - 10/10/16 0907      PT SHORT TERM GOAL #4   Title report a 25% reduction in LBP with home tasks   Baseline 5% improved; no hip pain now   Time 4   Period Weeks   Status On-going           PT Long Term Goals - 10/13/16 1058      PT LONG TERM GOAL #2   Title reduce FOTO to < or = to 27% limitation    Status On-going               Plan - 10/23/16 1002    Clinical Impression Statement Had some increased knee pain today but was able to perform all exercises.  Added bosu step ups and step overs with hand hold needed due to balance.  Thinks he would like to continue therapy but with focus on more higher level balance going forward.     PT Frequency 2x / week   PT Duration 8 weeks   PT Treatment/Interventions ADLs/Self Care Home Management;Cryotherapy;Electrical Stimulation;Functional mobility training;Gait training;Moist Heat;Therapeutic activities;Therapeutic exercise;Balance training;Neuromuscular re-education;Patient/family education;Passive range of motion;Manual techniques;Taping   PT Next Visit Plan , balance, core strength, postural strength   PT Home Exercise Plan progress as needed      Patient will benefit from skilled therapeutic intervention in order to improve the following deficits and impairments:  Decreased activity tolerance, Decreased strength, Postural dysfunction, Improper body mechanics, Impaired flexibility, Decreased range of motion, Decreased endurance, Pain  Visit Diagnosis: Muscle weakness (generalized)  Chronic left-sided low back pain without sciatica  Abnormal posture     Problem List Patient Active Problem List   Diagnosis Date Noted  . Tremor, essential 12/03/2015  . Essential hypertension 12/09/2014  . Coronary artery disease involving native heart 12/08/2013  . 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[place]) 12/08/2013  . Hyperlipidemia 12/08/2013  . Benign prostatic hypertrophy 08/08/2013  . Essential and other specified forms of tremor 11/25/2012    Stark Bray, DPT, CMP 10/23/2016, 10:08 AM  [facility] [address] [place], [place], [address] Phone: [phone]   Fax:  [phone]  Name: Cole Martinez MRN: [id number] Date of Birth: [birth year]

## 2016-10-25 ENCOUNTER — Encounter: Payer: Self-pay | Admitting: Physical Therapy

## 2016-10-25 ENCOUNTER — Ambulatory Visit: Payer: Medicare Other | Admitting: Physical Therapy

## 2016-10-25 DIAGNOSIS — M545 Low back pain, unspecified: Secondary | ICD-10-CM

## 2016-10-25 DIAGNOSIS — G8929 Other chronic pain: Secondary | ICD-10-CM | POA: Diagnosis not present

## 2016-10-25 DIAGNOSIS — R293 Abnormal posture: Secondary | ICD-10-CM | POA: Diagnosis not present

## 2016-10-25 DIAGNOSIS — M6281 Muscle weakness (generalized): Secondary | ICD-10-CM

## 2016-10-25 NOTE — Therapy (Signed)
[facility] [address] [place], [place], [address] Phone: [phone]   Fax:  [phone]  Physical Therapy Treatment  Patient Details  Name: Cole Martinez MRN: [id number] Date of Birth: 09-29-1945 Referring Provider: Dr. Delrae Rend  Encounter Date: 10/25/2016      PT End of Session - 10/25/16 1236    Visit Number 12   Number of Visits 20   Date for PT Re-Evaluation 12/20/16   Authorization Type medicare g-code on 20th visit; KX modifier at 15th visit   PT Start Time 1231   PT Stop Time 1309   PT Time Calculation (min) 38 min   Activity Tolerance Patient tolerated treatment well   Behavior During Therapy [facility] for tasks assessed/performed      Past Medical History:  Diagnosis Date  . ADHD (attention deficit hyperactivity disorder)   . B12 deficiency   . BPH (benign prostatic hypertrophy)   . 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 Coronary artery disease CARDIOLOGIST- DR Daneen Schick   S/P STENTING LAD 1999  . Crohn's disease of ileum ([place]) SINCE 1988  . ED (erectile dysfunction)   . Elevated PSA   . Essential and other specified forms of tremor 11/25/2012  . H/O adenomatous polyp of colon   . Hyperlipidemia   . Nocturia   . OA (osteoarthritis)   . Peripheral neuropathy    hx of, none current as of 08-04-13  . Peyronie disease   . S/P coronary artery stent placement OCT 1999 OF LAD  . Tremor, hereditary, benign MILD RIGHT HAND    Past Surgical History:  Procedure Laterality Date  . CARDIOVASCULAR STRESS TEST  11-01-2010 DR Daneen Schick   NORMAL PERFUSION STUDY/ EF 64%/ NO ISCHEMIA  . COLONOSCOPY WITH PROPOFOL N/A 09/24/2012   Procedure: COLONOSCOPY WITH PROPOFOL;  Surgeon: [name], MD;  Location: [facility];  Service: Endoscopy;  Laterality: N/A;  . CORONARY ANGIOPLASTY WITH STENT  PLACEMENT  OCT 1999   STENT OF LAD  . LAPAROSCOPIC INGUINAL HERNIA REPAIR Bilateral 01-10-2004   W/ MESH  . neck benign removed from neck  yrs ago  . PROSTATE BIOPSY N/A 07/12/2012   Procedure: PROSTATE BIOPSY AND ULTRASOUND;  Surgeon: Ailene Rud, MD;  Location: [facility];  Service: Urology;  Laterality: N/A;  . PROSTATE SURGERY  2002   tuna  . REMOVAL LEFT NECK LYMPH NODE  1996  . TONSILLECTOMY  CHILD  . TRANSURETHRAL RESECTION OF PROSTATE N/A 08/08/2013   Procedure: TRANSURETHRAL RESECTION OF THE PROSTATE WITH GYRUS INSTRUMENTS;  Surgeon: Ailene Rud, MD;  Location: [facility];  Service: Urology;  Laterality: N/A;  . TRANSURETHRAL RESECTION OF PROSTATE      There were no vitals filed for this visit.      Subjective Assessment - 10/25/16 1235    Subjective My biggest concern is my balance.  I feel my balance has not improved.    How long can you stand comfortably? limited by knee pain   How long can you walk comfortably? limited by knee pain   Diagnostic tests OA in Rt hip, no imaging of low back.  Osteoporosis    Patient Stated Goals return to walking, improve posture   Currently in Pain? Yes   Pain Score 4    Pain Location Knee   Pain Orientation Right;Left   Pain  Descriptors / Indicators Aching   Pain Type Chronic pain   Pain Onset More than a month ago   Pain Frequency Intermittent   Aggravating Factors  standing and walking   Pain Relieving Factors pain meds   Multiple Pain Sites No            [facility] PT Assessment - 10/25/16 0001      Assessment   Medical Diagnosis low bone mass, osteoporosis   Referring Provider Dr. Delrae Rend   Onset Date/Surgical Date 06/30/16   Prior Therapy none     Precautions   Precautions Other (comment)   Precaution Comments osteoporosis     Restrictions   Weight Bearing Restrictions No     Home Environment   Living Environment Private residence   Living Arrangements Spouse/significant other    Type of Lane Two level     Prior Function   Level of Independence Independent   Vocation Part time employment   Environmental health practitioner- desk work     Cognition   Overall Cognitive Status Within Functional Limits for tasks assessed     Observation/Other Assessments   Focus on Therapeutic Outcomes (FOTO)  31% limitation     Posture/Postural Control   Posture/Postural Control Postural limitations   Postural Limitations Flexed trunk;Forward head;Rounded Shoulders;Increased thoracic kyphosis   Posture Comments thoracic scoliosis, kyphosis, significant forward head     ROM / Strength   AROM / PROM / Strength Strength     Strength   Overall Strength Comments Bil. UE strength is 5/5;    Right Hip Flexion 4/5   Right Hip ABduction 3+/5   Left Hip Flexion 4/5   Left Hip Extension 3+/5   Left Hip ABduction 3+/5     Dynamic Gait Index   Level Surface Mild Impairment   Change in Gait Speed Normal   Gait with Horizontal Head Turns Mild Impairment   Gait with Vertical Head Turns Normal   Gait and Pivot Turn Moderate Impairment   Step Over Obstacle Normal   Step Around Obstacles Mild Impairment   Steps Mild Impairment   Total Score 18   DGI comment: predict falls with score                      [facility] Adult PT Treatment/Exercise - 10/25/16 0001      Knee/Hip Exercises: Standing   Hip Abduction Stengthening;Both;2 sets;10 reps   Abduction Limitations 2#  on black foam with VC to go slow                  PT Short Term Goals - 10/25/16 1237      PT SHORT TERM GOAL #1   Title be independent in initial HEP   Time 4   Period Weeks   Status Achieved     PT SHORT TERM GOAL #2   Title demonstrate improved seated posture and report postural corrections at home and work   Time 4   Period Weeks   Status Achieved     PT SHORT TERM GOAL #3   Title verbalize and demonstrate understanding of body mechanics modifications for lumbar  protection and osteoprosis precautions   Time 4   Period Weeks   Status Achieved     PT SHORT TERM GOAL #4   Title report a 25% reduction in LBP with home tasks   Time 4   Period Weeks   Status Achieved  PT Long Term Goals - 10/25/16 1238      PT LONG TERM GOAL #1   Title be independent in advanced HEP   Time 8   Period Weeks   Status On-going     PT LONG TERM GOAL #2   Title reduce FOTO to < or = to 27% limitation   Time 8   Period Weeks   Status On-going     PT LONG TERM GOAL #3   Title report a 50% reduction in LBP with home tasks   Time 8   Period Weeks   Status On-going     PT LONG TERM GOAL #4   Title report no falls at home or in the community and 50% improved confidence with balance   Time 8   Period Weeks   Status On-going  walking to car and slipped on wet ground     PT LONG TERM GOAL #5   Title Dynamic Gait Index score >/= 22 due to improve balance   Time 8   Period Weeks   Status New   Target Date 12/20/16               Plan - 10/25/16 1302    Clinical Impression Statement Patient is feeling unsteady with walking. Patient has increased strength with exception of bilateral hip abduction 3+/10, left hip extension 4/5, and bilateral hip flexion 4/5. Dynamic Gait Index score is 18 indicating risk of falls.  Patient hsa difficulty walking with turning his head right and left.  Patient has difficulty with walking and pivoting.  Patient posture consists of forward head and increased thoracic kyphosis.  Patient  will walk with one one foot infront of other and has a whip to the left heel.  Patient has difficulty with going down steps due to decreased control with quads. Patient reports he has falled 1 time since therapy started when the ground was wet. Patient will  benefit from  physical therapy to improve his balance and strength to prevent falls.    Clinical Presentation Stable   Rehab Potential Good   PT Frequency 2x / week   PT  Duration 8 weeks   PT Treatment/Interventions ADLs/Self Care Home Management;Cryotherapy;Electrical Stimulation;Functional mobility training;Gait training;Moist Heat;Therapeutic activities;Therapeutic exercise;Balance training;Neuromuscular re-education;Patient/family education;Passive range of motion;Manual techniques;Taping   PT Next Visit Plan , balance, core strength, postural strength; down steps with slow control; hip abduction and flexion; walking and pivoting; walking with head turns   PT Home Exercise Plan progress as needed   Recommended Other Services renewal note sent in   Consulted and Agree with Plan of Care Patient      Patient will benefit from skilled therapeutic intervention in order to improve the following deficits and impairments:  Decreased activity tolerance, Decreased strength, Postural dysfunction, Improper body mechanics, Impaired flexibility, Decreased range of motion, Decreased endurance, Pain  Visit Diagnosis: Muscle weakness (generalized) - Plan: PT plan of care cert/re-cert  Chronic left-sided low back pain without sciatica - Plan: PT plan of care cert/re-cert  Abnormal posture - Plan: PT plan of care cert/re-cert     Problem List Patient Active Problem List   Diagnosis Date Noted  . Tremor, essential 12/03/2015  . Essential hypertension 12/09/2014  . Coronary artery disease involving native heart 12/08/2013  . 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[place]) 12/08/2013  . Hyperlipidemia 12/08/2013  . Benign prostatic hypertrophy 08/08/2013  . Essential and other specified forms of tremor 11/25/2012    Earlie Counts, PT 10/25/16 1:19 PM  [facility] [address] [place], [place], [address] Phone: [phone]   Fax:  [phone]  Name: Cole Martinez MRN: [id number] Date of Birth: 02-Jan-1946

## 2016-11-02 DIAGNOSIS — M170 Bilateral primary osteoarthritis of knee: Secondary | ICD-10-CM | POA: Diagnosis not present

## 2016-11-10 ENCOUNTER — Encounter: Payer: Medicare Other | Admitting: Rehabilitation

## 2016-11-15 ENCOUNTER — Encounter: Payer: Self-pay | Admitting: Physical Therapy

## 2016-11-15 ENCOUNTER — Ambulatory Visit: Payer: Medicare Other | Attending: Internal Medicine | Admitting: Physical Therapy

## 2016-11-15 DIAGNOSIS — M6281 Muscle weakness (generalized): Secondary | ICD-10-CM | POA: Insufficient documentation

## 2016-11-15 DIAGNOSIS — M545 Low back pain, unspecified: Secondary | ICD-10-CM

## 2016-11-15 DIAGNOSIS — R293 Abnormal posture: Secondary | ICD-10-CM | POA: Diagnosis not present

## 2016-11-15 DIAGNOSIS — G8929 Other chronic pain: Secondary | ICD-10-CM | POA: Diagnosis not present

## 2016-11-15 NOTE — Therapy (Addendum)
[facility] [address] [place], [place], [address] Phone: [phone]   Fax:  [phone]  Physical Therapy Treatment  Patient Details  Name: Cole Martinez MRN: [id number] Date of Birth: 12/20/1945 Referring Provider: Dr. Delrae Rend  Encounter Date: 11/15/2016      PT End of Session - 11/15/16 1609    Visit Number 3   Number of Visits 20   Date for PT Re-Evaluation 12/20/16   Authorization Type medicare g-code on 20th visit; KX modifier at 15th visit   PT Start Time 1530   PT Stop Time 1609   PT Time Calculation (min) 39 min   Activity Tolerance Patient tolerated treatment well   Behavior During Therapy [facility] for tasks assessed/performed    Visit Number [place], PT 11/21/16 9:31 AM    Past Medical History:  Diagnosis Date  . ADHD (attention deficit hyperactivity disorder)   . B12 deficiency   . BPH (benign prostatic hypertrophy)   . 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 Coronary artery disease CARDIOLOGIST- DR Daneen Schick   S/P STENTING LAD 1999  . Crohn's disease of ileum ([place]) SINCE 1988  . ED (erectile dysfunction)   . Elevated PSA   . Essential and other specified forms of tremor 11/25/2012  . H/O adenomatous polyp of colon   . Hyperlipidemia   . Nocturia   . OA (osteoarthritis)   . Peripheral neuropathy    hx of, none current as of 08-04-13  . Peyronie disease   . S/P coronary artery stent placement OCT 1999 OF LAD  . Tremor, hereditary, benign MILD RIGHT HAND    Past Surgical History:  Procedure Laterality Date  . CARDIOVASCULAR STRESS TEST  11-01-2010 DR Daneen Schick   NORMAL PERFUSION STUDY/ EF 64%/ NO ISCHEMIA  . COLONOSCOPY WITH PROPOFOL N/A 09/24/2012   Procedure: COLONOSCOPY WITH PROPOFOL;  Surgeon: [name], MD;  Location: [facility];  Service: Endoscopy;   Laterality: N/A;  . CORONARY ANGIOPLASTY WITH STENT PLACEMENT  OCT 1999   STENT OF LAD  . LAPAROSCOPIC INGUINAL HERNIA REPAIR Bilateral 01-10-2004   W/ MESH  . neck benign removed from neck  yrs ago  . PROSTATE BIOPSY N/A 07/12/2012   Procedure: PROSTATE BIOPSY AND ULTRASOUND;  Surgeon: Ailene Rud, MD;  Location: [facility];  Service: Urology;  Laterality: N/A;  . PROSTATE SURGERY  2002   tuna  . REMOVAL LEFT NECK LYMPH NODE  1996  . TONSILLECTOMY  CHILD  . TRANSURETHRAL RESECTION OF PROSTATE N/A 08/08/2013   Procedure: TRANSURETHRAL RESECTION OF THE PROSTATE WITH GYRUS INSTRUMENTS;  Surgeon: Ailene Rud, MD;  Location: [facility];  Service: Urology;  Laterality: N/A;  . TRANSURETHRAL RESECTION OF PROSTATE      There were no vitals filed for this visit.      Subjective Assessment - 11/15/16 1535    Subjective I saw Dr. Wynelle Link last week and he wants me to try 3 injections in my    Pertinent History osteoporosis: t score -1.5, considering Rt knee replacement   How long can you stand comfortably? limited by knee pain   How long can you walk comfortably? limited by knee pain   Diagnostic tests OA in Rt hip, no imaging of low back.  Osteoporosis    Patient Stated Goals return to walking, improve posture  Currently in Pain? No/denies                         [facility] Adult PT Treatment/Exercise - 11/15/16 0001      Neuro Re-ed    Neuro Re-ed Details  walk on level and carpet turning head side to side,      Neck Exercises: Machines for Strengthening   UBE (Upper Arm Bike) level 5 (3/3)     Lumbar Exercises: Standing   Other Standing Lumbar Exercises forward step marching and side stepping through the ladder   Other Standing Lumbar Exercises stand and pivot     Knee/Hip Exercises: Standing   Hip Abduction Stengthening;Both;2 sets;10 reps   Abduction Limitations holding onto bar  on black foam with VC to go slow   Hip Extension  Stengthening;Left;Right;2 sets;10 reps   Extension Limitations on foam holding onto bar   Rebounder bounce for 1 min; stand with feet together eyes closed 15 sec 3 times                  PT Short Term Goals - 10/25/16 1237      PT SHORT TERM GOAL #1   Title be independent in initial HEP   Time 4   Period Weeks   Status Achieved     PT SHORT TERM GOAL #2   Title demonstrate improved seated posture and report postural corrections at home and work   Time 4   Period Weeks   Status Achieved     PT SHORT TERM GOAL #3   Title verbalize and demonstrate understanding of body mechanics modifications for lumbar protection and osteoprosis precautions   Time 4   Period Weeks   Status Achieved     PT SHORT TERM GOAL #4   Title report a 25% reduction in LBP with home tasks   Time 4   Period Weeks   Status Achieved           PT Long Term Goals - 11/15/16 1612      PT LONG TERM GOAL #1   Title be independent in advanced HEP   Time 8   Period Weeks   Status On-going     PT LONG TERM GOAL #3   Title report a 50% reduction in LBP with home tasks   Time 8   Period Weeks   Status On-going     PT LONG TERM GOAL #4   Title report no falls at home or in the community and 50% improved confidence with balance   Time 8   Period Weeks   Status On-going     PT LONG TERM GOAL #5   Title Dynamic Gait Index score >/= 22 due to improve balance   Time 8   Period Weeks   Status On-going               Plan - 11/15/16 1610    Clinical Impression Statement Patient has difficulty with his eyes closed while standing on reformer.  Patient needs verbal cues to go slowly when walking. Patient is doing better with stand and pivot on flat floor.  Patient will benefit from skilled therapy to improve his balance and strength to prevent falls.    Rehab Potential Good   PT Frequency 2x / week   PT Duration 8 weeks   PT Treatment/Interventions ADLs/Self Care Home  Management;Cryotherapy;Electrical Stimulation;Functional mobility training;Gait training;Moist Heat;Therapeutic activities;Therapeutic exercise;Balance training;Neuromuscular re-education;Patient/family education;Passive range of motion;Manual  techniques;Taping   PT Next Visit Plan , balance, core strength, postural strength; down steps with slow control; hip abduction and flexion; walking and pivoting; walking with head turns   PT Home Exercise Plan progress as needed   Recommended Other Services MD signed the renewal note   Consulted and Agree with Plan of Care Patient      Patient will benefit from skilled therapeutic intervention in order to improve the following deficits and impairments:  Decreased activity tolerance, Decreased strength, Postural dysfunction, Improper body mechanics, Impaired flexibility, Decreased range of motion, Decreased endurance, Pain  Visit Diagnosis: Muscle weakness (generalized)  Chronic left-sided low back pain without sciatica  Abnormal posture     Problem List Patient Active Problem List   Diagnosis Date Noted  . Tremor, essential 12/03/2015  . Essential hypertension 12/09/2014  . Coronary artery disease involving native heart 12/08/2013  . 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[place]) 12/08/2013  . Hyperlipidemia 12/08/2013  . Benign prostatic hypertrophy 08/08/2013  . Essential and other specified forms of tremor 11/25/2012    Earlie Counts, PT 11/15/16 4:13 PM   [facility] [address], [place], [place], [address] Phone: [phone]   Fax:  [phone]  Name: Cole Martinez MRN: [id number] Date of Birth: 1946-02-09

## 2016-11-20 DIAGNOSIS — F900 Attention-deficit hyperactivity disorder, predominantly inattentive type: Secondary | ICD-10-CM | POA: Diagnosis not present

## 2016-11-20 DIAGNOSIS — M109 Gout, unspecified: Secondary | ICD-10-CM | POA: Diagnosis not present

## 2016-11-20 DIAGNOSIS — I2510 Atherosclerotic heart disease of native coronary artery without angina pectoris: Secondary | ICD-10-CM | POA: Diagnosis not present

## 2016-11-20 DIAGNOSIS — E7800 Pure hypercholesterolemia, unspecified: Secondary | ICD-10-CM | POA: Diagnosis not present

## 2016-11-20 DIAGNOSIS — E038 Other specified hypothyroidism: Secondary | ICD-10-CM | POA: Diagnosis not present

## 2016-11-20 DIAGNOSIS — E538 Deficiency of other specified B group vitamins: Secondary | ICD-10-CM | POA: Diagnosis not present

## 2016-11-20 DIAGNOSIS — M47816 Spondylosis without myelopathy or radiculopathy, lumbar region: Secondary | ICD-10-CM | POA: Diagnosis not present

## 2016-11-20 DIAGNOSIS — C9110 Chronic lymphocytic leukemia of B-cell type not having achieved remission: Secondary | ICD-10-CM | POA: Diagnosis not present

## 2016-11-20 DIAGNOSIS — N400 Enlarged prostate without lower urinary tract symptoms: Secondary | ICD-10-CM | POA: Diagnosis not present

## 2016-11-20 DIAGNOSIS — R29898 Other symptoms and signs involving the musculoskeletal system: Secondary | ICD-10-CM | POA: Diagnosis not present

## 2016-11-20 DIAGNOSIS — I10 Essential (primary) hypertension: Secondary | ICD-10-CM | POA: Diagnosis not present

## 2016-11-20 DIAGNOSIS — E559 Vitamin D deficiency, unspecified: Secondary | ICD-10-CM | POA: Diagnosis not present

## 2016-11-21 ENCOUNTER — Encounter: Payer: Self-pay | Admitting: Physical Therapy

## 2016-11-21 ENCOUNTER — Ambulatory Visit: Payer: Medicare Other | Admitting: Physical Therapy

## 2016-11-21 DIAGNOSIS — G8929 Other chronic pain: Secondary | ICD-10-CM | POA: Diagnosis not present

## 2016-11-21 DIAGNOSIS — M545 Low back pain, unspecified: Secondary | ICD-10-CM

## 2016-11-21 DIAGNOSIS — M6281 Muscle weakness (generalized): Secondary | ICD-10-CM | POA: Diagnosis not present

## 2016-11-21 DIAGNOSIS — R293 Abnormal posture: Secondary | ICD-10-CM | POA: Diagnosis not present

## 2016-11-21 NOTE — Therapy (Signed)
[facility] [address] [place], [place], [address] Phone: [phone]   Fax:  [phone]  Physical Therapy Treatment  Patient Details  Name: Cole Martinez MRN: [id number] Date of Birth: 1945-12-14 Referring Provider: Dr. Delrae Rend  Encounter Date: 11/21/2016      PT End of Session - 11/21/16 0925    Visit Number 14   Number of Visits 20   Date for PT Re-Evaluation 12/20/16   Authorization Type medicare g-code on 20th visit; KX modifier at 15th visit   PT Start Time 0845   PT Stop Time 0926   PT Time Calculation (min) 41 min   Activity Tolerance Patient tolerated treatment well   Behavior During Therapy [facility] for tasks assessed/performed      Past Medical History:  Diagnosis Date  . ADHD (attention deficit hyperactivity disorder)   . B12 deficiency   . BPH (benign prostatic hypertrophy)   . 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 Coronary artery disease CARDIOLOGIST- DR Daneen Schick   S/P STENTING LAD 1999  . Crohn's disease of ileum ([place]) SINCE 1988  . ED (erectile dysfunction)   . Elevated PSA   . Essential and other specified forms of tremor 11/25/2012  . H/O adenomatous polyp of colon   . Hyperlipidemia   . Nocturia   . OA (osteoarthritis)   . Peripheral neuropathy    hx of, none current as of 08-04-13  . Peyronie disease   . S/P coronary artery stent placement OCT 1999 OF LAD  . Tremor, hereditary, benign MILD RIGHT HAND    Past Surgical History:  Procedure Laterality Date  . CARDIOVASCULAR STRESS TEST  11-01-2010 DR Daneen Schick   NORMAL PERFUSION STUDY/ EF 64%/ NO ISCHEMIA  . COLONOSCOPY WITH PROPOFOL N/A 09/24/2012   Procedure: COLONOSCOPY WITH PROPOFOL;  Surgeon: [name], MD;  [facility];  Service: Endoscopy;  Laterality: N/A;  . CORONARY ANGIOPLASTY WITH STENT  PLACEMENT  OCT 1999   STENT OF LAD  . LAPAROSCOPIC INGUINAL HERNIA REPAIR Bilateral 01-10-2004   W/ MESH  . neck benign removed from neck  yrs ago  . PROSTATE BIOPSY N/A 07/12/2012   Procedure: PROSTATE BIOPSY AND ULTRASOUND;  Surgeon: Ailene Rud, MD;  Location: [facility];  Service: Urology;  Laterality: N/A;  . PROSTATE SURGERY  2002   tuna  . REMOVAL LEFT NECK LYMPH NODE  1996  . TONSILLECTOMY  CHILD  . TRANSURETHRAL RESECTION OF PROSTATE N/A 08/08/2013   Procedure: TRANSURETHRAL RESECTION OF THE PROSTATE WITH GYRUS INSTRUMENTS;  Surgeon: Ailene Rud, MD;  [facility];  Service: Urology;  Laterality: N/A;  . TRANSURETHRAL RESECTION OF PROSTATE      There were no vitals filed for this visit.      Subjective Assessment - 11/21/16 0849    Subjective I had my trainer this morning.  He gave me a couple of things for balance. This morning I was having trouble with balance. I would like stuff on paper to give my trainer. I am off Lipitor and taking an enzyme  to counteract the lipitor.    Pertinent History osteoporosis: t score -1.5, considering Rt knee replacement   How long can you stand comfortably? limited by knee pain   How long can you walk comfortably? limited by knee pain   Diagnostic tests OA in  Rt hip, no imaging of low back.  Osteoporosis    Patient Stated Goals return to walking, improve posture   Currently in Pain? No/denies                         [facility] Adult PT Treatment/Exercise - 11/21/16 0001      Neck Exercises: Machines for Strengthening   UBE (Upper Arm Bike) level 5 (3/3)     Lumbar Exercises: Standing   Other Standing Lumbar Exercises forward step marching and side stepping through the ladder  VC for Gluteal contraction, abdominal contraction   Other Standing Lumbar Exercises stand and pivot; walk with head movements   several times lost balance and regained it     Knee/Hip Exercises: Standing    Rebounder bounce for 1 min; stand with feet together eyes closed 15 sec 3 times                PT Education - 11/21/16 0925    Education provided Yes   Education Details gave patient information for his trainer to work on   [employer]) Educated Patient   Methods Explanation   Comprehension Verbalized understanding          PT Short Term Goals - 10/25/16 1237      PT SHORT TERM GOAL #1   Title be independent in initial HEP   Time 4   Period Weeks   Status Achieved     PT SHORT TERM GOAL #2   Title demonstrate improved seated posture and report postural corrections at home and work   Time 4   Period Weeks   Status Achieved     PT SHORT TERM GOAL #3   Title verbalize and demonstrate understanding of body mechanics modifications for lumbar protection and osteoprosis precautions   Time 4   Period Weeks   Status Achieved     PT SHORT TERM GOAL #4   Title report a 25% reduction in LBP with home tasks   Time 4   Period Weeks   Status Achieved           PT Long Term Goals - 11/21/16 6440      PT LONG TERM GOAL #1   Title be independent in advanced HEP   Baseline still learning   Time 8   Period Weeks   Status On-going     PT LONG TERM GOAL #2   Title reduce FOTO to < or = to 27% limitation   Time 8   Period Weeks   Status On-going     PT LONG TERM GOAL #3   Title report a 50% reduction in LBP with home tasks   Baseline no pain today   Time 8   Period Weeks   Status On-going     PT LONG TERM GOAL #4   Title report no falls at home or in the community and 50% improved confidence with balance   Time 8   Period Weeks   Status Achieved     PT LONG TERM GOAL #5   Title Dynamic Gait Index score >/= 22 due to improve balance   Time 8   Period Weeks   Status On-going               Plan - 11/21/16 0926    Clinical Impression Statement Patient still has difficulty with standing with feet together with eyes closed, walking with head  movements, high step.  Patient  has better balance on some days compared to others.  Patient needs verbal cues to tighten his core, buttocks and look straight ahead for balance.  Patient will benefit from skilled therapy to improve his balance and strength to prevent falls.    Rehab Potential Good   PT Frequency 2x / week   PT Duration 8 weeks   PT Treatment/Interventions ADLs/Self Care Home Management;Cryotherapy;Electrical Stimulation;Functional mobility training;Gait training;Moist Heat;Therapeutic activities;Therapeutic exercise;Balance training;Neuromuscular re-education;Patient/family education;Passive range of motion;Manual techniques;Taping   PT Next Visit Plan , balance, core strength, postural strength; down steps with slow control; hip abduction and flexion; walking and pivoting; walking with head turns   PT Home Exercise Plan progress as needed   Consulted and Agree with Plan of Care Patient      Patient will benefit from skilled therapeutic intervention in order to improve the following deficits and impairments:  Decreased activity tolerance, Decreased strength, Postural dysfunction, Improper body mechanics, Impaired flexibility, Decreased range of motion, Decreased endurance, Pain  Visit Diagnosis: Muscle weakness (generalized)  Chronic left-sided low back pain without sciatica  Abnormal posture     Problem List Patient Active Problem List   Diagnosis Date Noted  . Tremor, essential 12/03/2015  . Essential hypertension 12/09/2014  . Coronary artery disease involving native heart 12/08/2013  . 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[place]) 12/08/2013  . Hyperlipidemia 12/08/2013  . Benign prostatic hypertrophy 08/08/2013  . Essential and other specified forms of tremor 11/25/2012    Earlie Counts, PT 11/21/16 9:29 AM   [facility] [address] [place], [place], [address] Phone: [phone]   Fax:   [phone]  Name: JONATHON TAN MRN: [id number] Date of Birth: 1945/08/09

## 2016-11-23 ENCOUNTER — Ambulatory Visit: Payer: Medicare Other | Admitting: Physical Therapy

## 2016-11-23 DIAGNOSIS — M545 Low back pain, unspecified: Secondary | ICD-10-CM

## 2016-11-23 DIAGNOSIS — M6281 Muscle weakness (generalized): Secondary | ICD-10-CM

## 2016-11-23 DIAGNOSIS — R293 Abnormal posture: Secondary | ICD-10-CM | POA: Diagnosis not present

## 2016-11-23 DIAGNOSIS — G8929 Other chronic pain: Secondary | ICD-10-CM | POA: Diagnosis not present

## 2016-11-23 NOTE — Therapy (Signed)
[facility] [address] [place], [place], [address] Phone: [phone]   Fax:  [phone]  Physical Therapy Treatment  Patient Details  Name: Cole Martinez MRN: [id number] Date of Birth: 10/16/1945 Referring Provider: Dr. Delrae Rend  Encounter Date: 11/23/2016      PT End of Session - 11/23/16 1428    Visit Number 15   Number of Visits 20   Date for PT Re-Evaluation 12/20/16   Authorization Type medicare g-code on 20th visit; KX modifier at 15th visit   PT Start Time 1230   PT Stop Time 1310   PT Time Calculation (min) 40 min   Activity Tolerance Patient tolerated treatment well   Behavior During Therapy [facility] for tasks assessed/performed      Past Medical History:  Diagnosis Date  . ADHD (attention deficit hyperactivity disorder)   . B12 deficiency   . BPH (benign prostatic hypertrophy)   . 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 Coronary artery disease CARDIOLOGIST- DR Daneen Schick   S/P STENTING LAD 1999  . Crohn's disease of ileum ([place]) SINCE 1988  . ED (erectile dysfunction)   . Elevated PSA   . Essential and other specified forms of tremor 11/25/2012  . H/O adenomatous polyp of colon   . Hyperlipidemia   . Nocturia   . OA (osteoarthritis)   . Peripheral neuropathy    hx of, none current as of 08-04-13  . Peyronie disease   . S/P coronary artery stent placement OCT 1999 OF LAD  . Tremor, hereditary, benign MILD RIGHT HAND    Past Surgical History:  Procedure Laterality Date  . CARDIOVASCULAR STRESS TEST  11-01-2010 DR Daneen Schick   NORMAL PERFUSION STUDY/ EF 64%/ NO ISCHEMIA  . COLONOSCOPY WITH PROPOFOL N/A 09/24/2012   Procedure: COLONOSCOPY WITH PROPOFOL;  Surgeon: [name], MD;  [facility];  Service: Endoscopy;  Laterality: N/A;  . CORONARY ANGIOPLASTY WITH STENT  PLACEMENT  OCT 1999   STENT OF LAD  . LAPAROSCOPIC INGUINAL HERNIA REPAIR Bilateral 01-10-2004   W/ MESH  . neck benign removed from neck  yrs ago  . PROSTATE BIOPSY N/A 07/12/2012   Procedure: PROSTATE BIOPSY AND ULTRASOUND;  Surgeon: Ailene Rud, MD;  Location: [facility];  Service: Urology;  Laterality: N/A;  . PROSTATE SURGERY  2002   tuna  . REMOVAL LEFT NECK LYMPH NODE  1996  . TONSILLECTOMY  CHILD  . TRANSURETHRAL RESECTION OF PROSTATE N/A 08/08/2013   Procedure: TRANSURETHRAL RESECTION OF THE PROSTATE WITH GYRUS INSTRUMENTS;  Surgeon: Ailene Rud, MD;  [facility];  Service: Urology;  Laterality: N/A;  . TRANSURETHRAL RESECTION OF PROSTATE      There were no vitals filed for this visit.                       [facility] Adult PT Treatment/Exercise - 11/23/16 0001      Neck Exercises: Machines for Strengthening   UBE (Upper Arm Bike) level 5 (3/3)  sitting on blue physioball     Lumbar Exercises: Standing   Other Standing Lumbar Exercises forward step marching and side stepping through the ladder  VC for Gluteal contraction, abdominal contraction   Other Standing Lumbar Exercises stand pivot touching a bolster standing and fiqure 8 around bolster and stand on foam and  reach for cones.      Knee/Hip Exercises: Standing   Rebounder stand on one foot 10 sec. bounce for 1 min; stand with feet together eyes closed 15 sec 3 times                  PT Short Term Goals - 10/25/16 1237      PT SHORT TERM GOAL #1   Title be independent in initial HEP   Time 4   Period Weeks   Status Achieved     PT SHORT TERM GOAL #2   Title demonstrate improved seated posture and report postural corrections at home and work   Time 4   Period Weeks   Status Achieved     PT SHORT TERM GOAL #3   Title verbalize and demonstrate understanding of body mechanics modifications for lumbar protection and osteoprosis precautions   Time 4    Period Weeks   Status Achieved     PT SHORT TERM GOAL #4   Title report a 25% reduction in LBP with home tasks   Time 4   Period Weeks   Status Achieved           PT Long Term Goals - 11/21/16 2993      PT LONG TERM GOAL #1   Title be independent in advanced HEP   Baseline still learning   Time 8   Period Weeks   Status On-going     PT LONG TERM GOAL #2   Title reduce FOTO to < or = to 27% limitation   Time 8   Period Weeks   Status On-going     PT LONG TERM GOAL #3   Title report a 50% reduction in LBP with home tasks   Baseline no pain today   Time 8   Period Weeks   Status On-going     PT LONG TERM GOAL #4   Title report no falls at home or in the community and 50% improved confidence with balance   Time 8   Period Weeks   Status Achieved     PT LONG TERM GOAL #5   Title Dynamic Gait Index score >/= 22 due to improve balance   Time 8   Period Weeks   Status On-going               Plan - 11/23/16 1244    Clinical Impression Statement Patient able to stand on the foam with good balance as he was reaching for cones.  Patient was able to walk forward in the ladder with good control or loss of balance while talking.  Patient still has slight difficulty with walking and pivot and has to regain his balance.  Patient was able to keep his balance while sitting on the blue ball as he is doing the UBE.  Patient will benfit from skilled therapy to improve his balance and strength to prevent falls.    Rehab Potential Good   PT Frequency 2x / week   PT Duration 8 weeks   PT Treatment/Interventions ADLs/Self Care Home Management;Cryotherapy;Electrical Stimulation;Functional mobility training;Gait training;Moist Heat;Therapeutic activities;Therapeutic exercise;Balance training;Neuromuscular re-education;Patient/family education;Passive range of motion;Manual techniques;Taping   PT Next Visit Plan , balance while standing on black foam; , core strength, postural  strength; down steps with slow control; hip abduction and flexion; walking and pivoting; walking with head turns   PT Home Exercise Plan progress as needed   Consulted and Agree with Plan of Care Patient  Patient will benefit from skilled therapeutic intervention in order to improve the following deficits and impairments:  Decreased activity tolerance, Decreased strength, Postural dysfunction, Improper body mechanics, Impaired flexibility, Decreased range of motion, Decreased endurance, Pain  Visit Diagnosis: Muscle weakness (generalized)  Chronic left-sided low back pain without sciatica  Abnormal posture     Problem List Patient Active Problem List   Diagnosis Date Noted  . Tremor, essential 12/03/2015  . Essential hypertension 12/09/2014  . Coronary artery disease involving native heart 12/08/2013  . 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[place]) 12/08/2013  . Hyperlipidemia 12/08/2013  . Benign prostatic hypertrophy 08/08/2013  . Essential and other specified forms of tremor 11/25/2012    Earlie Counts, PT 11/23/16 2:33 PM    [facility] [address], [place], [place], [address] Phone: [phone]   Fax:  [phone]  Name: ELIZAH MIERZWA MRN: [id number] Date of Birth: 17-Jun-1945

## 2016-11-27 ENCOUNTER — Other Ambulatory Visit: Payer: Self-pay

## 2016-11-27 DIAGNOSIS — C8300 Small cell B-cell lymphoma, unspecified site: Secondary | ICD-10-CM

## 2016-11-28 ENCOUNTER — Ambulatory Visit (HOSPITAL_BASED_OUTPATIENT_CLINIC_OR_DEPARTMENT_OTHER): Payer: Medicare Other | Admitting: Oncology

## 2016-11-28 ENCOUNTER — Other Ambulatory Visit (HOSPITAL_BASED_OUTPATIENT_CLINIC_OR_DEPARTMENT_OTHER): Payer: Medicare Other

## 2016-11-28 ENCOUNTER — Ambulatory Visit: Payer: Medicare Other | Admitting: Physical Therapy

## 2016-11-28 ENCOUNTER — Encounter: Payer: Self-pay | Admitting: Physical Therapy

## 2016-11-28 ENCOUNTER — Telehealth: Payer: Self-pay | Admitting: Oncology

## 2016-11-28 VITALS — BP 123/87 | HR 79 | Temp 98.7°F | Resp 20 | Ht 68.0 in | Wt 191.9 lb

## 2016-11-28 DIAGNOSIS — C9110 Chronic lymphocytic leukemia of B-cell type not having achieved remission: Secondary | ICD-10-CM

## 2016-11-28 DIAGNOSIS — G8929 Other chronic pain: Secondary | ICD-10-CM | POA: Diagnosis not present

## 2016-11-28 DIAGNOSIS — M545 Low back pain, unspecified: Secondary | ICD-10-CM

## 2016-11-28 DIAGNOSIS — M6281 Muscle weakness (generalized): Secondary | ICD-10-CM | POA: Diagnosis not present

## 2016-11-28 DIAGNOSIS — C8300 Small cell B-cell lymphoma, unspecified site: Secondary | ICD-10-CM

## 2016-11-28 DIAGNOSIS — R293 Abnormal posture: Secondary | ICD-10-CM | POA: Diagnosis not present

## 2016-11-28 LAB — CBC WITH DIFFERENTIAL/PLATELET
BASO%: 0.5 % (ref 0.0–2.0)
Basophils Absolute: 0 10*3/uL (ref 0.0–0.1)
EOS%: 4.7 % (ref 0.0–7.0)
Eosinophils Absolute: 0.4 10*3/uL (ref 0.0–0.5)
HCT: 44.9 % (ref 38.4–49.9)
HGB: 15 g/dL (ref 13.0–17.1)
LYMPH%: 25.1 % (ref 14.0–49.0)
MCH: 30.4 pg (ref 27.2–33.4)
MCHC: 33.4 g/dL (ref 32.0–36.0)
MCV: 90.9 fL (ref 79.3–98.0)
MONO#: 0.7 10*3/uL (ref 0.1–0.9)
MONO%: 9.3 % (ref 0.0–14.0)
NEUT#: 4.5 10*3/uL (ref 1.5–6.5)
NEUT%: 60.4 % (ref 39.0–75.0)
Platelets: 242 10*3/uL (ref 140–400)
RBC: 4.94 10*6/uL (ref 4.20–5.82)
RDW: 13.7 % (ref 11.0–14.6)
WBC: 7.5 10*3/uL (ref 4.0–10.3)
lymph#: 1.9 10*3/uL (ref 0.9–3.3)

## 2016-11-28 NOTE — Progress Notes (Signed)
  Cole Martinez OFFICE PROGRESS NOTE   Diagnosis: CLL  INTERVAL HISTORY:   Mr. Cole Martinez returns for scheduled visit. No fever, night sweats, anorexia, or recent infection. He reports muscle weakness. He states he was taken off of a statin. He is being followed by orthopedics for knee arthritis.  Objective:  Vital signs in last 24 hours:  Blood pressure 123/87, pulse 79, temperature 98.7 F (37.1 C), temperature source Oral, resp. rate 20, height 5' 8"  (1.727 m), weight 191 lb 14.4 oz (87 kg), SpO2 100 %.    HEENT: Neck without mass Lymphatics: No cervical, supraclavicular, axillary, or inguinal nodes Resp: Lungs clear bilaterally Cardio: Regular rate and rhythm GI: No hepatosplenomegaly Vascular: No leg edema   Lab Results:  Lab Results  Component Value Date   WBC 7.5 11/28/2016   HGB 15.0 11/28/2016   HCT 44.9 11/28/2016   MCV 90.9 11/28/2016   PLT 242 11/28/2016   NEUTROABS 4.5 11/28/2016    Medications: I have reviewed the patient's current medications.  Assessment/Plan: 1. Chronic lymphocytic leukemia, diagnosed in 1996. He remains asymptomatic and stable from a hematologic standpoint. 2. pneumococcal vaccine given on 11/30/2015 , 13 valent pneumonia vaccine given 11/13/2013    Disposition:  Mr. Cole Martinez appears stable. He is asymptomatic from CLL. He would like to continue follow-up at the [facility]. He will return for an office visit in one year. He will obtain a yearly influenza vaccine.  15 minutes were spent with the patient today. The majority of the time was used for counseling and coordination of care.  Donneta Romberg, MD  11/28/2016  9:18 AM

## 2016-11-28 NOTE — Telephone Encounter (Signed)
Gave avs and calendar for September 2019

## 2016-11-28 NOTE — Therapy (Signed)
[facility] [address] [place], [place], [address] Phone: [phone]   Fax:  [phone]  Physical Therapy Treatment  Patient Details  Name: Cole Martinez MRN: [id number] Date of Birth: 04/25/1945 Referring Provider: Dr. Delrae Rend  Encounter Date: 11/28/2016      PT End of Session - 11/28/16 1242    Visit Number 16   Number of Visits 20   Date for PT Re-Evaluation 12/20/16   Authorization Type medicare g-code on 20th visit; KX modifier at 15th visit   PT Start Time 1230   PT Stop Time 1310   PT Time Calculation (min) 40 min   Activity Tolerance Patient tolerated treatment well   Behavior During Therapy [facility] for tasks assessed/performed      Past Medical History:  Diagnosis Date  . ADHD (attention deficit hyperactivity disorder)   . B12 deficiency   . BPH (benign prostatic hypertrophy)   . 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 Coronary artery disease CARDIOLOGIST- DR Daneen Schick   S/P STENTING LAD 1999  . Crohn's disease of ileum ([place]) SINCE 1988  . ED (erectile dysfunction)   . Elevated PSA   . Essential and other specified forms of tremor 11/25/2012  . H/O adenomatous polyp of colon   . Hyperlipidemia   . Nocturia   . OA (osteoarthritis)   . Peripheral neuropathy    hx of, none current as of 08-04-13  . Peyronie disease   . S/P coronary artery stent placement OCT 1999 OF LAD  . Tremor, hereditary, benign MILD RIGHT HAND    Past Surgical History:  Procedure Laterality Date  . CARDIOVASCULAR STRESS TEST  11-01-2010 DR Daneen Schick   NORMAL PERFUSION STUDY/ EF 64%/ NO ISCHEMIA  . COLONOSCOPY WITH PROPOFOL N/A 09/24/2012   Procedure: COLONOSCOPY WITH PROPOFOL;  Surgeon: [name], MD;  [facility];  Service: Endoscopy;  Laterality: N/A;  . CORONARY ANGIOPLASTY WITH STENT  PLACEMENT  OCT 1999   STENT OF LAD  . LAPAROSCOPIC INGUINAL HERNIA REPAIR Bilateral 01-10-2004   W/ MESH  . neck benign removed from neck  yrs ago  . PROSTATE BIOPSY N/A 07/12/2012   Procedure: PROSTATE BIOPSY AND ULTRASOUND;  Surgeon: Ailene Rud, MD;  Location[facility];  Service: Urology;  Laterality: N/A;  . PROSTATE SURGERY  2002   tuna  . REMOVAL LEFT NECK LYMPH NODE  1996  . TONSILLECTOMY  CHILD  . TRANSURETHRAL RESECTION OF PROSTATE N/A 08/08/2013   Procedure: TRANSURETHRAL RESECTION OF THE PROSTATE WITH GYRUS INSTRUMENTS;  Surgeon: Ailene Rud, MD;  [facility];  Service: Urology;  Laterality: N/A;  . TRANSURETHRAL RESECTION OF PROSTATE      There were no vitals filed for this visit.      Subjective Assessment - 11/28/16 1240    Subjective I am doing well. I had an injection into my right knee.    Pertinent History osteoporosis: t score -1.5, considering Rt knee replacement   How long can you stand comfortably? limited by knee pain   How long can you walk comfortably? limited by knee pain   Diagnostic tests OA in Rt hip, no imaging of low back.  Osteoporosis    Patient Stated Goals return to walking, improve posture   Currently in Pain? No/denies  [facility] PT Assessment - 11/28/16 0001      Strength   Right Hip Flexion 4+/5   Right Hip ABduction 4-/5   Left Hip Flexion 4+/5   Left Hip Extension 4-/5   Left Hip ABduction 4-/5     Dynamic Gait Index   Level Surface Normal   Gait and Pivot Turn Mild Impairment                     [facility] Adult PT Treatment/Exercise - 11/28/16 0001      Neck Exercises: Machines for Strengthening   UBE (Upper Arm Bike) level 5 (3/3)  sitting on blue physioball     Lumbar Exercises: Standing   Other Standing Lumbar Exercises stand pivot touching a bolster standing and fiqure 8 around bolster and stand on foam and reach for cones.      Knee/Hip Exercises: Standing    Rebounder stand on one foot 10 sec. bounce for 1 min; stand with feet together eyes closed 15 sec 3 times   Gait Training up and down steps 5 times   Other Standing Knee Exercises walk over bolster                  PT Short Term Goals - 10/25/16 1237      PT SHORT TERM GOAL #1   Title be independent in initial HEP   Time 4   Period Weeks   Status Achieved     PT SHORT TERM GOAL #2   Title demonstrate improved seated posture and report postural corrections at home and work   Time 4   Period Weeks   Status Achieved     PT SHORT TERM GOAL #3   Title verbalize and demonstrate understanding of body mechanics modifications for lumbar protection and osteoprosis precautions   Time 4   Period Weeks   Status Achieved     PT SHORT TERM GOAL #4   Title report a 25% reduction in LBP with home tasks   Time 4   Period Weeks   Status Achieved           PT Long Term Goals - 11/28/16 1242      PT LONG TERM GOAL #1   Title be independent in advanced HEP   Baseline still learning   Time 8   Period Weeks   Status On-going     PT LONG TERM GOAL #2   Title reduce FOTO to < or = to 27% limitation   Period Weeks   Status On-going     PT LONG TERM GOAL #3   Title report a 50% reduction in LBP with home tasks   Baseline 20% better   Time 8   Period Weeks   Status On-going     PT LONG TERM GOAL #5   Title Dynamic Gait Index score >/= 22 due to improve balance   Time 8   Period Weeks   Status On-going               Plan - 11/28/16 1321    Clinical Impression Statement Patient able to go up and down steps with increased control.  Patient able to stand and pivot with no loss of balance and score went from 1 to 3.  Patient is able to walk on level surface with no loss of balance. Patient has increased in bilateral hip strength.  Patient will benefit from skilled therapy to improve his balance and strength to prevent  falls.    Rehab Potential Good   PT Frequency  2x / week   PT Duration 8 weeks   PT Treatment/Interventions ADLs/Self Care Home Management;Cryotherapy;Electrical Stimulation;Functional mobility training;Gait training;Moist Heat;Therapeutic activities;Therapeutic exercise;Balance training;Neuromuscular re-education;Patient/family education;Passive range of motion;Manual techniques;Taping   PT Next Visit Plan , balance while standing on black foam; , core strength, postural strength; down steps with slow control; hip abduction and flexion; walking and pivoting; walking with head turns   PT Home Exercise Plan progress as needed   Consulted and Agree with Plan of Care Patient      Patient will benefit from skilled therapeutic intervention in order to improve the following deficits and impairments:  Decreased activity tolerance, Decreased strength, Postural dysfunction, Improper body mechanics, Impaired flexibility, Decreased range of motion, Decreased endurance, Pain  Visit Diagnosis: Muscle weakness (generalized)  Chronic left-sided low back pain without sciatica  Abnormal posture     Problem List Patient Active Problem List   Diagnosis Date Noted  . Tremor, essential 12/03/2015  . Essential hypertension 12/09/2014  . Coronary artery disease involving native heart 12/08/2013  . 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[place]) 12/08/2013  . Hyperlipidemia 12/08/2013  . Benign prostatic hypertrophy 08/08/2013  . Essential and other specified forms of tremor 11/25/2012    Earlie Counts, PT 11/28/16 1:26 PM   [facility] [address] [place], [place], [address] Phone: [phone]   Fax:  [phone]  Name: FAIN FRANCIS MRN: [id number] Date of Birth: [birth year]

## 2016-11-29 DIAGNOSIS — R972 Elevated prostate specific antigen [PSA]: Secondary | ICD-10-CM | POA: Diagnosis not present

## 2016-11-29 DIAGNOSIS — N401 Enlarged prostate with lower urinary tract symptoms: Secondary | ICD-10-CM | POA: Diagnosis not present

## 2016-11-29 DIAGNOSIS — R351 Nocturia: Secondary | ICD-10-CM | POA: Diagnosis not present

## 2016-11-29 DIAGNOSIS — E291 Testicular hypofunction: Secondary | ICD-10-CM | POA: Diagnosis not present

## 2016-11-29 DIAGNOSIS — N486 Induration penis plastica: Secondary | ICD-10-CM | POA: Diagnosis not present

## 2016-11-29 DIAGNOSIS — N5201 Erectile dysfunction due to arterial insufficiency: Secondary | ICD-10-CM | POA: Diagnosis not present

## 2016-11-30 ENCOUNTER — Encounter: Payer: Medicare Other | Admitting: Physical Therapy

## 2016-11-30 DIAGNOSIS — M1711 Unilateral primary osteoarthritis, right knee: Secondary | ICD-10-CM | POA: Diagnosis not present

## 2016-12-01 ENCOUNTER — Encounter: Payer: Self-pay | Admitting: Rehabilitation

## 2016-12-01 ENCOUNTER — Ambulatory Visit: Payer: Medicare Other | Admitting: Rehabilitation

## 2016-12-01 DIAGNOSIS — R293 Abnormal posture: Secondary | ICD-10-CM | POA: Diagnosis not present

## 2016-12-01 DIAGNOSIS — G8929 Other chronic pain: Secondary | ICD-10-CM

## 2016-12-01 DIAGNOSIS — M545 Low back pain: Secondary | ICD-10-CM | POA: Diagnosis not present

## 2016-12-01 DIAGNOSIS — M6281 Muscle weakness (generalized): Secondary | ICD-10-CM

## 2016-12-01 NOTE — Therapy (Signed)
[facility] [address] [place], [place], [address] Phone: [phone]   Fax:  [phone]  Physical Therapy Treatment  Patient Details  Name: Cole Martinez MRN: [id number] Date of Birth: 05-Oct-1945 Referring Provider: Dr. Delrae Rend  Encounter Date: 12/01/2016      PT End of Session - 12/01/16 0930    Visit Number 17   Number of Visits 20   Date for PT Re-Evaluation 12/20/16   Authorization Type medicare g-code on 20th visit; KX modifier at 15th visit   PT Start Time 0845   PT Stop Time 0928   PT Time Calculation (min) 43 min   Activity Tolerance Patient tolerated treatment well      Past Medical History:  Diagnosis Date  . ADHD (attention deficit hyperactivity disorder)   . B12 deficiency   . BPH (benign prostatic hypertrophy)   . 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 Coronary artery disease CARDIOLOGIST- DR Daneen Schick   S/P STENTING LAD 1999  . Crohn's disease of ileum ([place]) SINCE 1988  . ED (erectile dysfunction)   . Elevated PSA   . Essential and other specified forms of tremor 11/25/2012  . H/O adenomatous polyp of colon   . Hyperlipidemia   . Nocturia   . OA (osteoarthritis)   . Peripheral neuropathy    hx of, none current as of 08-04-13  . Peyronie disease   . S/P coronary artery stent placement OCT 1999 OF LAD  . Tremor, hereditary, benign MILD RIGHT HAND    Past Surgical History:  Procedure Laterality Date  . CARDIOVASCULAR STRESS TEST  11-01-2010 DR Daneen Schick   NORMAL PERFUSION STUDY/ EF 64%/ NO ISCHEMIA  . COLONOSCOPY WITH PROPOFOL N/A 09/24/2012   Procedure: COLONOSCOPY WITH PROPOFOL;  Surgeon: [name], MD;  Location: [facility];  Service: Endoscopy;  Laterality: N/A;  . CORONARY ANGIOPLASTY WITH STENT PLACEMENT  OCT 1999   STENT OF LAD  . LAPAROSCOPIC INGUINAL  HERNIA REPAIR Bilateral 01-10-2004   W/ MESH  . neck benign removed from neck  yrs ago  . PROSTATE BIOPSY N/A 07/12/2012   Procedure: PROSTATE BIOPSY AND ULTRASOUND;  Surgeon: Ailene Rud, MD;  Location: [facility];  Service: Urology;  Laterality: N/A;  . PROSTATE SURGERY  2002   tuna  . REMOVAL LEFT NECK LYMPH NODE  1996  . TONSILLECTOMY  CHILD  . TRANSURETHRAL RESECTION OF PROSTATE N/A 08/08/2013   Procedure: TRANSURETHRAL RESECTION OF THE PROSTATE WITH GYRUS INSTRUMENTS;  Surgeon: Ailene Rud, MD;  Location: [facility];  Service: Urology;  Laterality: N/A;  . TRANSURETHRAL RESECTION OF PROSTATE      There were no vitals filed for this visit.      Subjective Assessment - 12/01/16 0841    Subjective Had an injection yesterday in the R knee (not 3 days ago as noted per pt) Next one will be next Thursday)   Currently in Pain? No/denies                         [facility] Adult PT Treatment/Exercise - 12/01/16 0001      Ambulation/Gait   Gait Comments gait with pivot turns x 6     High Level Balance   High Level Balance Comments SL stance cone moving x 49mn on plinth  Neck Exercises: Machines for Strengthening   UBE (Upper Arm Bike) level 3 (3'/3')     Knee/Hip Exercises: Standing   Step Down Both;2 sets;10 reps   Step Down Limitations tap downs only 2 hand hold   Stairs standing on black foam with tap up to 2nd step bil fwd and side x 10 each no hands   Rebounder Sl stance with EC work today bilateral   Other Standing Knee Exercises side steps and monster steps 2x10 SBA      Shoulder Exercises: Power [employer] 20 reps   Row Limitations 35#                  PT Short Term Goals - 10/25/16 1237      PT SHORT TERM GOAL #1   Title be independent in initial HEP   Time 4   Period Weeks   Status Achieved     PT SHORT TERM GOAL #2   Title demonstrate improved seated posture and report postural corrections at home and  work   Time 4   Period Weeks   Status Achieved     PT SHORT TERM GOAL #3   Title verbalize and demonstrate understanding of body mechanics modifications for lumbar protection and osteoprosis precautions   Time 4   Period Weeks   Status Achieved     PT SHORT TERM GOAL #4   Title report a 25% reduction in LBP with home tasks   Time 4   Period Weeks   Status Achieved           PT Long Term Goals - 11/28/16 1242      PT LONG TERM GOAL #1   Title be independent in advanced HEP   Baseline still learning   Time 8   Period Weeks   Status On-going     PT LONG TERM GOAL #2   Title reduce FOTO to < or = to 27% limitation   Period Weeks   Status On-going     PT LONG TERM GOAL #3   Title report a 50% reduction in LBP with home tasks   Baseline 20% better   Time 8   Period Weeks   Status On-going     PT LONG TERM GOAL #5   Title Dynamic Gait Index score >/= 22 due to improve balance   Time 8   Period Weeks   Status On-going               Plan - 12/01/16 0930    Clinical Impression Statement Tolerated all well today.  Most difficulty with SL stance work.  Stairs improved with patient reporting he is working on this with the trainer as well. Subjectively reports all is better from balance to posture     PT Frequency 2x / week   PT Duration 8 weeks   PT Treatment/Interventions ADLs/Self Care Home Management;Cryotherapy;Electrical Stimulation;Functional mobility training;Gait training;Moist Heat;Therapeutic activities;Therapeutic exercise;Balance training;Neuromuscular re-education;Patient/family education;Passive range of motion;Manual techniques;Taping   PT Next Visit Plan , balance while standing on black foam; , core strength, postural strength; down steps with slow control; hip abduction and flexion; walking and pivoting; walking with head turns   PT Home Exercise Plan progress as needed      Patient will benefit from skilled therapeutic intervention in order to  improve the following deficits and impairments:  Decreased activity tolerance, Decreased strength, Postural dysfunction, Improper body mechanics, Impaired flexibility, Decreased range of motion, Decreased endurance, Pain  Visit Diagnosis: Muscle weakness (generalized)  Chronic left-sided low back pain without sciatica  Abnormal posture     Problem List Patient Active Problem List   Diagnosis Date Noted  . Tremor, essential 12/03/2015  . Essential hypertension 12/09/2014  . Coronary artery disease involving native heart 12/08/2013  . 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[place]) 12/08/2013  . Hyperlipidemia 12/08/2013  . Benign prostatic hypertrophy 08/08/2013  . Essential and other specified forms of tremor 11/25/2012    Stark Bray, DPT, CMP 12/01/2016, 9:41 AM  [facility] [address] [place], [place], [address] Phone: [phone]   Fax:  [phone]  Name: AFTON MIKELSON MRN: [id number] Date of Birth: [birth year]

## 2016-12-05 ENCOUNTER — Encounter: Payer: Self-pay | Admitting: Physical Therapy

## 2016-12-05 ENCOUNTER — Ambulatory Visit: Payer: Medicare Other | Attending: Internal Medicine | Admitting: Physical Therapy

## 2016-12-05 DIAGNOSIS — M6281 Muscle weakness (generalized): Secondary | ICD-10-CM | POA: Insufficient documentation

## 2016-12-05 DIAGNOSIS — M545 Low back pain, unspecified: Secondary | ICD-10-CM

## 2016-12-05 DIAGNOSIS — R293 Abnormal posture: Secondary | ICD-10-CM

## 2016-12-05 DIAGNOSIS — G8929 Other chronic pain: Secondary | ICD-10-CM | POA: Insufficient documentation

## 2016-12-05 NOTE — Therapy (Signed)
[facility] [address] [place], [place], [address] Phone: [phone]   Fax:  [phone]  Physical Therapy Treatment  Patient Details  Name: Cole Martinez MRN: [id number] Date of Birth: 03-Apr-1945 Referring Provider: Dr. Delrae Rend  Encounter Date: 12/05/2016      PT End of Session - 12/05/16 1013    Visit Number 18   Number of Visits 20   Date for PT Re-Evaluation 12/20/16   Authorization Type medicare g-code on 20th visit; KX modifier at 15th visit   PT Start Time 0930   PT Stop Time 1010   PT Time Calculation (min) 40 min   Activity Tolerance Patient tolerated treatment well   Behavior During Therapy [facility] for tasks assessed/performed      Past Medical History:  Diagnosis Date  . ADHD (attention deficit hyperactivity disorder)   . B12 deficiency   . BPH (benign prostatic hypertrophy)   . 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 Coronary artery disease CARDIOLOGIST- DR Daneen Schick   S/P STENTING LAD 1999  . Crohn's disease of ileum ([place]) SINCE 1988  . ED (erectile dysfunction)   . Elevated PSA   . Essential and other specified forms of tremor 11/25/2012  . H/O adenomatous polyp of colon   . Hyperlipidemia   . Nocturia   . OA (osteoarthritis)   . Peripheral neuropathy    hx of, none current as of 08-04-13  . Peyronie disease   . S/P coronary artery stent placement OCT 1999 OF LAD  . Tremor, hereditary, benign MILD RIGHT HAND    Past Surgical History:  Procedure Laterality Date  . CARDIOVASCULAR STRESS TEST  11-01-2010 DR Daneen Schick   NORMAL PERFUSION STUDY/ EF 64%/ NO ISCHEMIA  . COLONOSCOPY WITH PROPOFOL N/A 09/24/2012   Procedure: COLONOSCOPY WITH PROPOFOL;  Surgeon: [name], MD;  [facility];  Service: Endoscopy;  Laterality: N/A;  . CORONARY ANGIOPLASTY WITH STENT  PLACEMENT  OCT 1999   STENT OF LAD  . LAPAROSCOPIC INGUINAL HERNIA REPAIR Bilateral 01-10-2004   W/ MESH  . neck benign removed from neck  yrs ago  . PROSTATE BIOPSY N/A 07/12/2012   Procedure: PROSTATE BIOPSY AND ULTRASOUND;  Surgeon: Ailene Rud, MD;  Location: [facility];  Service: Urology;  Laterality: N/A;  . PROSTATE SURGERY  2002   tuna  . REMOVAL LEFT NECK LYMPH NODE  1996  . TONSILLECTOMY  CHILD  . TRANSURETHRAL RESECTION OF PROSTATE N/A 08/08/2013   Procedure: TRANSURETHRAL RESECTION OF THE PROSTATE WITH GYRUS INSTRUMENTS;  Surgeon: Ailene Rud, MD;  [facility];  Service: Urology;  Laterality: N/A;  . TRANSURETHRAL RESECTION OF PROSTATE      There were no vitals filed for this visit.      Subjective Assessment - 12/05/16 0933    Subjective My knee is still doing well. I stumbled going down the stairs over the weekend. I see the cardiologist on 12/14/2016.  I still get winded. I feel like something is not right so I am getting  more answers.    Pertinent History osteoporosis: t score -1.5, considering Rt knee replacement   How long can you stand comfortably? limited by knee pain   How long can you walk comfortably? limited by knee pain   Diagnostic tests OA in Rt hip, no imaging of low back.  Osteoporosis    Patient Stated Goals return to walking, improve posture   Currently in Pain? No/denies                         [facility] Adult PT Treatment/Exercise - 12/05/16 0001      Ambulation/Gait   Gait Comments walking up and down steps with therapist crowding him to challange his balance. Patient was out of breath oafterwards     Neck Exercises: Machines for Strengthening   UBE (Upper Arm Bike) level 3 (3'/3')     Lumbar Exercises: Standing   Other Standing Lumbar Exercises walk through ladder forward with march, sideways, tip toe , heel wakl     Knee/Hip Exercises: Standing   Rebounder stand with feet together eyes  closed 15 sec; bounce 1 min                  PT Short Term Goals - 10/25/16 1237      PT SHORT TERM GOAL #1   Title be independent in initial HEP   Time 4   Period Weeks   Status Achieved     PT SHORT TERM GOAL #2   Title demonstrate improved seated posture and report postural corrections at home and work   Time 4   Period Weeks   Status Achieved     PT SHORT TERM GOAL #3   Title verbalize and demonstrate understanding of body mechanics modifications for lumbar protection and osteoprosis precautions   Time 4   Period Weeks   Status Achieved     PT SHORT TERM GOAL #4   Title report a 25% reduction in LBP with home tasks   Time 4   Period Weeks   Status Achieved           PT Long Term Goals - 12/05/16 0941      PT LONG TERM GOAL #1   Title be independent in advanced HEP   Baseline still learning   Time 8   Period Weeks   Status On-going     PT LONG TERM GOAL #2   Title reduce FOTO to < or = to 27% limitation   Time 8   Period Weeks   Status On-going     PT LONG TERM GOAL #3   Title report a 50% reduction in LBP with home tasks   Baseline 20% better   Time 8   Period Weeks   Status On-going     PT LONG TERM GOAL #4   Title report no falls at home or in the community and 50% improved confidence with balance   Baseline stumbled going down the stairs   Time 8   Period Weeks   Status Achieved     PT LONG TERM GOAL #5   Title Dynamic Gait Index score >/= 22 due to improve balance   Time 8   Period Weeks   Status On-going               Plan - 12/05/16 0934    Clinical Impression Statement Patient needed verbal cues to not hold handrail while coming down and up a stair with him being crowded by the therapist due to patient feeling unsteady.  Patient reports he stumbled going down stairs  the other day and not sure why his balance is not better due to working on it.  Patient has more difficulty with walking on his toes more than his  heels.  Patient will benefit from skilled therapy to improve balance to decrease risk of falls due to  osteoporosis.    Rehab Potential Good   PT Frequency 2x / week   PT Duration 8 weeks   PT Treatment/Interventions ADLs/Self Care Home Management;Cryotherapy;Electrical Stimulation;Functional mobility training;Gait training;Moist Heat;Therapeutic activities;Therapeutic exercise;Balance training;Neuromuscular re-education;Patient/family education;Passive range of motion;Manual techniques;Taping   PT Next Visit Plan , balance while standing on black foam; , core strength, postural strength; down steps with slow control; hip abduction and flexion; walking and pivoting; walking with head turns   PT Home Exercise Plan progress as needed   Consulted and Agree with Plan of Care Patient      Patient will benefit from skilled therapeutic intervention in order to improve the following deficits and impairments:  Decreased activity tolerance, Decreased strength, Postural dysfunction, Improper body mechanics, Impaired flexibility, Decreased range of motion, Decreased endurance, Pain  Visit Diagnosis: Muscle weakness (generalized)  Chronic left-sided low back pain without sciatica  Abnormal posture     Problem List Patient Active Problem List   Diagnosis Date Noted  . Tremor, essential 12/03/2015  . Essential hypertension 12/09/2014  . Coronary artery disease involving native heart 12/08/2013  . 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[place]) 12/08/2013  . Hyperlipidemia 12/08/2013  . Benign prostatic hypertrophy 08/08/2013  . Essential and other specified forms of tremor 11/25/2012    Earlie Counts, PT 12/05/16 10:23 AM   [facility] [address] [place], [place], [address] Phone: [phone]   Fax:  [phone]  Name: CAIUS SILBERNAGEL MRN: [id number] Date of Birth: 04-Sep-1945

## 2016-12-07 ENCOUNTER — Encounter: Payer: Medicare Other | Admitting: Physical Therapy

## 2016-12-07 DIAGNOSIS — M170 Bilateral primary osteoarthritis of knee: Secondary | ICD-10-CM | POA: Diagnosis not present

## 2016-12-08 ENCOUNTER — Encounter: Payer: Self-pay | Admitting: Physical Therapy

## 2016-12-08 ENCOUNTER — Ambulatory Visit: Payer: Medicare Other | Admitting: Physical Therapy

## 2016-12-08 DIAGNOSIS — M6281 Muscle weakness (generalized): Secondary | ICD-10-CM | POA: Diagnosis not present

## 2016-12-08 DIAGNOSIS — R293 Abnormal posture: Secondary | ICD-10-CM | POA: Diagnosis not present

## 2016-12-08 DIAGNOSIS — M545 Low back pain: Secondary | ICD-10-CM | POA: Diagnosis not present

## 2016-12-08 DIAGNOSIS — G8929 Other chronic pain: Secondary | ICD-10-CM | POA: Diagnosis not present

## 2016-12-08 NOTE — Therapy (Signed)
[facility] [address] [place], [place], [address] Phone: [phone]   Fax:  [phone]  Physical Therapy Treatment  Patient Details  Name: Cole Martinez MRN: [id number] Date of Birth: 07/13/1945 Referring Provider: Dr. Delrae Rend  Encounter Date: 12/08/2016      PT End of Session - 12/08/16 0931    Visit Number 19   Number of Visits 20   Date for PT Re-Evaluation 12/20/16   Authorization Type medicare g-code on 20th visit; KX modifier at 15th visit   PT Start Time 0845   PT Stop Time 0925   PT Time Calculation (min) 40 min   Activity Tolerance Patient tolerated treatment well   Behavior During Therapy [facility] for tasks assessed/performed      Past Medical History:  Diagnosis Date  . ADHD (attention deficit hyperactivity disorder)   . B12 deficiency   . BPH (benign prostatic hypertrophy)   . 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 Coronary artery disease CARDIOLOGIST- DR Daneen Schick   S/P STENTING LAD 1999  . Crohn's disease of ileum ([place]) SINCE 1988  . ED (erectile dysfunction)   . Elevated PSA   . Essential and other specified forms of tremor 11/25/2012  . H/O adenomatous polyp of colon   . Hyperlipidemia   . Nocturia   . OA (osteoarthritis)   . Peripheral neuropathy    hx of, none current as of 08-04-13  . Peyronie disease   . S/P coronary artery stent placement OCT 1999 OF LAD  . Tremor, hereditary, benign MILD RIGHT HAND    Past Surgical History:  Procedure Laterality Date  . CARDIOVASCULAR STRESS TEST  11-01-2010 DR Daneen Schick   NORMAL PERFUSION STUDY/ EF 64%/ NO ISCHEMIA  . COLONOSCOPY WITH PROPOFOL N/A 09/24/2012   Procedure: COLONOSCOPY WITH PROPOFOL;  Surgeon: [name], MD;  [facility];  Service: Endoscopy;  Laterality: N/A;  . CORONARY ANGIOPLASTY WITH STENT  PLACEMENT  OCT 1999   STENT OF LAD  . LAPAROSCOPIC INGUINAL HERNIA REPAIR Bilateral 01-10-2004   W/ MESH  . neck benign removed from neck  yrs ago  . PROSTATE BIOPSY N/A 07/12/2012   Procedure: PROSTATE BIOPSY AND ULTRASOUND;  Surgeon: Ailene Rud, MD;  [facility];  Service: Urology;  Laterality: N/A;  . PROSTATE SURGERY  2002   tuna  . REMOVAL LEFT NECK LYMPH NODE  1996  . TONSILLECTOMY  CHILD  . TRANSURETHRAL RESECTION OF PROSTATE N/A 08/08/2013   Procedure: TRANSURETHRAL RESECTION OF THE PROSTATE WITH GYRUS INSTRUMENTS;  Surgeon: Ailene Rud, MD;  [facility];  Service: Urology;  Laterality: N/A;  . TRANSURETHRAL RESECTION OF PROSTATE      There were no vitals filed for this visit.                       [place] Adult PT Treatment/Exercise - 12/08/16 0001      Ambulation/Gait   Gait Comments walking up and down steps with therapist crowding him to challange his balance. Patient was out of breath oafterwards  difficulty with feet together     Neck Exercises: Machines for Strengthening   UBE (Upper Arm Bike) level 3 (3'/3')     Lumbar Exercises: Standing   Other Standing Lumbar Exercises walk through ladder forward with march, sideways, tip toe ,  heel wakl   Other Standing Lumbar Exercises walk with legs up and out     Knee/Hip Exercises: Standing   Rebounder stand with feet together eyes closed 15 sec; bounce 1 min   Other Standing Knee Exercises walking between width of tile to work on balance     Shoulder Exercises: Seated   External Rotation Strengthening;Both;15 reps   Theraband Level (Shoulder External Rotation) Level 3 (Green)                PT Education - 12/08/16 0930    Education provided Yes   Education Details bilateral shoulder ER with elbows at his side with greenband   Person(s) Educated Patient   Methods Explanation;Demonstration;Verbal cues   Comprehension Returned  demonstration;Verbalized understanding          PT Short Term Goals - 10/25/16 1237      PT SHORT TERM GOAL #1   Title be independent in initial HEP   Time 4   Period Weeks   Status Achieved     PT SHORT TERM GOAL #2   Title demonstrate improved seated posture and report postural corrections at home and work   Time 4   Period Weeks   Status Achieved     PT SHORT TERM GOAL #3   Title verbalize and demonstrate understanding of body mechanics modifications for lumbar protection and osteoprosis precautions   Time 4   Period Weeks   Status Achieved     PT SHORT TERM GOAL #4   Title report a 25% reduction in LBP with home tasks   Time 4   Period Weeks   Status Achieved           PT Long Term Goals - 12/05/16 0941      PT LONG TERM GOAL #1   Title be independent in advanced HEP   Baseline still learning   Time 8   Period Weeks   Status On-going     PT LONG TERM GOAL #2   Title reduce FOTO to < or = to 27% limitation   Time 8   Period Weeks   Status On-going     PT LONG TERM GOAL #3   Title report a 50% reduction in LBP with home tasks   Baseline 20% better   Time 8   Period Weeks   Status On-going     PT LONG TERM GOAL #4   Title report no falls at home or in the community and 50% improved confidence with balance   Baseline stumbled going down the stairs   Time 8   Period Weeks   Status Achieved     PT LONG TERM GOAL #5   Title Dynamic Gait Index score >/= 22 due to improve balance   Time 8   Period Weeks   Status On-going               Plan - 12/08/16 0931    Clinical Impression Statement Patient is able to stand on trampoline with eyes closed with less body sway not having to hold on.  Patient was able to walk down steps with therapist at his side but had increased trunk sway due to decreased base of support.  Patient is able to walk with smaller base of support  when he focuses.  Patient wil lbenefit from skilled therapy to balance to  decrease risk of falls due to osteoporosis.    Rehab Potential Good   PT Frequency 2x / week  PT Duration 8 weeks   PT Treatment/Interventions ADLs/Self Care Home Management;Cryotherapy;Electrical Stimulation;Functional mobility training;Gait training;Moist Heat;Therapeutic activities;Therapeutic exercise;Balance training;Neuromuscular re-education;Patient/family education;Passive range of motion;Manual techniques;Taping   PT Next Visit Plan g-code is to be done; balance with standing on black foam and walking with smaller base of support   PT Home Exercise Plan progress as needed   Consulted and Agree with Plan of Care Patient      Patient will benefit from skilled therapeutic intervention in order to improve the following deficits and impairments:  Decreased activity tolerance, Decreased strength, Postural dysfunction, Improper body mechanics, Impaired flexibility, Decreased range of motion, Decreased endurance, Pain  Visit Diagnosis: Muscle weakness (generalized)  Abnormal posture     Problem List Patient Active Problem List   Diagnosis Date Noted  . Tremor, essential 12/03/2015  . Essential hypertension 12/09/2014  . Coronary artery disease involving native heart 12/08/2013  . 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[place]) 12/08/2013  . Hyperlipidemia 12/08/2013  . Benign prostatic hypertrophy 08/08/2013  . Essential and other specified forms of tremor 11/25/2012    Earlie Counts, PT 12/08/16 9:36 AM   [facility] [address], [place], [place], [address] Phone: [phone]   Fax:  [phone]  Name: Cole Martinez MRN: [id number] Date of Birth: 1945-07-12

## 2016-12-12 ENCOUNTER — Encounter: Payer: Self-pay | Admitting: Physical Therapy

## 2016-12-12 ENCOUNTER — Other Ambulatory Visit: Payer: Self-pay | Admitting: Interventional Cardiology

## 2016-12-12 ENCOUNTER — Ambulatory Visit: Payer: Medicare Other | Admitting: Physical Therapy

## 2016-12-12 DIAGNOSIS — M6281 Muscle weakness (generalized): Secondary | ICD-10-CM | POA: Diagnosis not present

## 2016-12-12 DIAGNOSIS — M545 Low back pain: Secondary | ICD-10-CM | POA: Diagnosis not present

## 2016-12-12 DIAGNOSIS — G8929 Other chronic pain: Secondary | ICD-10-CM | POA: Diagnosis not present

## 2016-12-12 DIAGNOSIS — R293 Abnormal posture: Secondary | ICD-10-CM

## 2016-12-12 NOTE — Progress Notes (Signed)
Cardiology Office Note    Date:  12/13/2016   ID:  Cole Martinez, DOB 1945-08-19, MRN [id number]  PCP:  Cole Huddle, MD  Cardiologist: Sinclair Grooms, MD   Chief Complaint  Patient presents with  . Coronary Artery Disease  . Follow-up    History of Present Illness:  Cole Martinez is a 71 y.o. male  who presents for CAD with bare-metal stent in LAD, hyperlipidemia, elevated BP but not diagnosed hypertension, and obesity.  [name] is concerned about lower extremity weakness. Dr. Inda Merlin feels that statins may be contributing. He has been off therapy now for 4 weeks without improvement in lower extremity weakness. He has chronic low back discomfort.  He notices in rehabilitation to strengthen lower extremities that there is dyspnea on exertion that he feels is disproportionate. There've also been atypical episodes of chest discomfort. He has history of CAD with prior LAD stent/PCI (1999).   Past Medical History:  Diagnosis Date  . ADHD (attention deficit hyperactivity disorder)   . B12 deficiency   . BPH (benign prostatic hypertrophy)   . 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 Coronary artery disease CARDIOLOGIST- DR Daneen Schick   S/P STENTING LAD 1999  . Crohn's disease of ileum ([place]) SINCE 1988  . ED (erectile dysfunction)   . Elevated PSA   . Essential and other specified forms of tremor 11/25/2012  . H/O adenomatous polyp of colon   . Hyperlipidemia   . Nocturia   . OA (osteoarthritis)   . Peripheral neuropathy    hx of, none current as of 08-04-13  . Peyronie disease   . S/P coronary artery stent placement OCT 1999 OF LAD  . Tremor, hereditary, benign MILD RIGHT HAND    Past Surgical History:  Procedure Laterality Date  . CARDIOVASCULAR STRESS TEST  11-01-2010 DR Daneen Schick   NORMAL PERFUSION STUDY/ EF 64%/ NO ISCHEMIA  . COLONOSCOPY WITH PROPOFOL N/A  09/24/2012   Procedure: COLONOSCOPY WITH PROPOFOL;  Surgeon: [name], MD;  Location: [facility];  Service: Endoscopy;  Laterality: N/A;  . CORONARY ANGIOPLASTY WITH STENT PLACEMENT  OCT 1999   STENT OF LAD  . LAPAROSCOPIC INGUINAL HERNIA REPAIR Bilateral 01-10-2004   W/ MESH  . neck benign removed from neck  yrs ago  . PROSTATE BIOPSY N/A 07/12/2012   Procedure: PROSTATE BIOPSY AND ULTRASOUND;  Surgeon: Ailene Rud, MD;  Location[facility];  Service: Urology;  Laterality: N/A;  . PROSTATE SURGERY  2002   tuna  . REMOVAL LEFT NECK LYMPH NODE  1996  . TONSILLECTOMY  CHILD  . TRANSURETHRAL RESECTION OF PROSTATE N/A 08/08/2013   Procedure: TRANSURETHRAL RESECTION OF THE PROSTATE WITH GYRUS INSTRUMENTS;  Surgeon: Ailene Rud, MD;  Location: [facility];  Service: Urology;  Laterality: N/A;  . TRANSURETHRAL RESECTION OF PROSTATE      Current Medications: Outpatient Medications Prior to Visit  Medication Sig Dispense Refill  . amLODipine (NORVASC) 2.5 MG tablet TAKE 1 TABLET BY MOUTH  DAILY 90 tablet 0  . amphetamine-dextroamphetamine (ADDERALL) 5 MG tablet Take 5 mg by mouth 2 (two) times daily.    Marland Kitchen aspirin EC 81 MG tablet Take 81 mg by mouth daily.    . celecoxib (CELEBREX) 200 MG capsule Take 200 mg by mouth daily.    . Cyanocobalamin 2500 MCG CHEW Chew 1 tablet  by mouth daily.     Marland Kitchen omeprazole (PRILOSEC) 20 MG capsule Take 20 mg by mouth daily.    Marland Kitchen VITAMIN E PO Take 1 Dose by mouth once a week.    Marland Kitchen atorvastatin (LIPITOR) 20 MG tablet TAKE 1 TABLET BY MOUTH  EVERY MORNING (Patient not taking: Reported on 11/28/2016) 90 tablet 0   No facility-administered medications prior to visit.      Allergies:   Patient has no known allergies.   Social History   Social History  . Marital status: Married    Spouse name: N/A  . Number of children: N/A  . Years of education: N/A   Social History Main Topics  . Smoking status: Former Smoker    Packs/day:  1.00    Years: 10.00    Types: Cigarettes    Quit date: 07/09/1984  . Smokeless tobacco: Never Used  . Alcohol use 1.0 oz/week    2 Standard drinks or equivalent per week     Comment: daily 1 per day  . Drug use: No  . Sexual activity: Not Asked   Other Topics Concern  . None   Social History Narrative   Lives at home with is wife, Cole Martinez.  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  Has BBA.  Has 3 children.    Caffeine 2 cups coffee.       Family History:  The patient's family history includes Diabetes in his brother; Obesity in his brother; Parkinsonism in his brother.   ROS:   Please see the history of present illness.    Left sided tremor. Concerned that statins could be causing muscle weakness and difficulty with balance. Chronic back and muscle pain, difficulty with balance, snoring, leg pain, excessive fatigue, easy bruising.  All other systems reviewed and are negative.   PHYSICAL EXAM:   VS:  BP 126/74 (BP Location: Left Arm)   Pulse 85   Ht 5' 7"  (1.702 m)   Wt 193 lb 6.4 oz (87.7 kg)   BMI 30.29 kg/m    GEN: Well nourished, well developed, in no acute distress . Moderate obesity. HEENT: normal  Neck: no JVD, carotid bruits, or masses Cardiac: RRR; no murmurs, rubs, or gallops,no edema  Respiratory:  clear to auscultation bilaterally, normal work of breathing GI: soft, nontender, nondistended, + BS MS: no deformity or atrophy  Skin: warm and dry, no rash Neuro:  Alert and Oriented x 3, Strength and sensation are intact Psych: euthymic mood, full affect  Wt Readings from Last 3 Encounters:  12/13/16 193 lb 6.4 oz (87.7 kg)  11/28/16 191 lb 14.4 oz (87 kg)  07/12/16 193 lb (87.5 kg)      Studies/Labs Reviewed:   EKG:  EKG  Normal sinus rhythm with small inferior Q waves extending to V5 and V6. Nonspecific T-wave abnormality.  Recent Labs: 11/28/2016: HGB 15.0; Platelets 242   Lipid Panel    Component Value Date/Time   CHOL 154 12/14/2015 0806   TRIG 118 12/14/2015  0806   HDL 47 12/14/2015 0806   CHOLHDL 3.3 12/14/2015 0806   VLDL 24 12/14/2015 0806   LDLCALC 83 12/14/2015 0806    Additional studies/ records that were reviewed today include:  Prior lower extremity Doppler study performed in 2017 did not reveal evidence of significant lower extremity perfusion abnormality.    ASSESSMENT:    1. Coronary artery disease involving native coronary artery of native heart without angina pectoris   2. Essential hypertension   3. Mixed  hyperlipidemia   4. 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HCC)   5. Chest pain, unspecified type   6. SOB (shortness of breath)      PLAN:  In order of problems listed above:  1. Dyspnea could be an anginal equivalent. Lexiscan myocardial perfusion study will be done to rule out significant regions of ischemia and assess LV function. 2. Blood pressure control is not adequate. Target should be less than 140/90 mmHg. Low salt diet and weight loss are encouraged. 3. Lipids are not likely to be in range. Will leave management to Dr. Inda Merlin. I encouraged the patient to have his lipids checked in a month when he sees Dr. Inda Merlin. 4. None 5. Summary atypical but Lexiscan Myoview will be done. 6. See above. Lexiscan Myoview.  Clinical follow-up in one year unless significant abnormalities on nuclear scintigraphy.     Medication Adjustments/Labs and Tests Ordered: Current medicines are reviewed at length with the patient today.  Concerns regarding medicines are outlined above.  Medication changes, Labs and Tests ordered today are listed in the Patient Instructions below. Patient Instructions  Medication Instructions:  Your physician recommends that you continue on your current medications as directed. Please refer to the Current Medication list given to you today.  Labwork: None  Testing/Procedures: Your physician has requested that you have a lexiscan myoview. For further information please visit [email or web address]. Please  follow instruction sheet, as given.   Follow-Up: Your physician wants you to follow-up in: 1 year with Dr. Tamala Julian.  You will receive a reminder letter in the mail two months in advance. If you don't receive a letter, please call our office to schedule the follow-up appointment.   Any Other Special Instructions Will Be Listed Below (If Applicable).     If you need a refill on your cardiac medications before your next appointment, please call your pharmacy.      Signed, Sinclair Grooms, MD  12/13/2016 9:49 AM    [facility] [place], [place], [place]  [address] Phone: [phone]; Fax: [phone]

## 2016-12-12 NOTE — Therapy (Signed)
[facility] [address], [place], [place], [address] Phone: [phone]   Fax:  [phone]  Physical Therapy Treatment  Patient Details  Name: Cole Martinez MRN: [id number] Date of Birth: 12-31-1945 Referring Provider: Dr. Delrae Rend  Encounter Date: 12/12/2016      PT End of Session - 12/12/16 1008    Visit Number 20   Number of Visits 20   Date for PT Re-Evaluation 12/20/16   Authorization Type medicare g-code on 20th visit; KX modifier at 15th visit   PT Start Time 0930   PT Stop Time 1012   PT Time Calculation (min) 42 min   Activity Tolerance Patient tolerated treatment well   Behavior During Therapy [facility] for tasks assessed/performed      Past Medical History:  Diagnosis Date  . ADHD (attention deficit hyperactivity disorder)   . B12 deficiency   . BPH (benign prostatic hypertrophy)   . 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 Coronary artery disease CARDIOLOGIST- DR Daneen Schick   S/P STENTING LAD 1999  . Crohn's disease of ileum ([place]) SINCE 1988  . ED (erectile dysfunction)   . Elevated PSA   . Essential and other specified forms of tremor 11/25/2012  . H/O adenomatous polyp of colon   . Hyperlipidemia   . Nocturia   . OA (osteoarthritis)   . Peripheral neuropathy    hx of, none current as of 08-04-13  . Peyronie disease   . S/P coronary artery stent placement OCT 1999 OF LAD  . Tremor, hereditary, benign MILD RIGHT HAND    Past Surgical History:  Procedure Laterality Date  . CARDIOVASCULAR STRESS TEST  11-01-2010 DR Daneen Schick   NORMAL PERFUSION STUDY/ EF 64%/ NO ISCHEMIA  . COLONOSCOPY WITH PROPOFOL N/A 09/24/2012   Procedure: COLONOSCOPY WITH PROPOFOL;  Surgeon: [name], MD;  [facility];  Service: Endoscopy;  Laterality: N/A;  . CORONARY ANGIOPLASTY WITH STENT  PLACEMENT  OCT 1999   STENT OF LAD  . LAPAROSCOPIC INGUINAL HERNIA REPAIR Bilateral 01-10-2004   W/ MESH  . neck benign removed from neck  yrs ago  . PROSTATE BIOPSY N/A 07/12/2012   Procedure: PROSTATE BIOPSY AND ULTRASOUND;  Surgeon: Ailene Rud, MD;  Location: [facility];  Service: Urology;  Laterality: N/A;  . PROSTATE SURGERY  2002   tuna  . REMOVAL LEFT NECK LYMPH NODE  1996  . TONSILLECTOMY  CHILD  . TRANSURETHRAL RESECTION OF PROSTATE N/A 08/08/2013   Procedure: TRANSURETHRAL RESECTION OF THE PROSTATE WITH GYRUS INSTRUMENTS;  Surgeon: Ailene Rud, MD;  Location: [facility];  Service: Urology;  Laterality: N/A;  . TRANSURETHRAL RESECTION OF PROSTATE      There were no vitals filed for this visit.      Subjective Assessment - 12/12/16 0936    Subjective I feel better.  My back is bothering me a little today. I did alot of lifting yesterday moving files. I see the heart doctor tomorrow.  I get the last shot on my knee on Friday.    Pertinent History osteoporosis: t score -1.5, considering Rt knee replacement   How long can you stand comfortably? limited by knee pain   How long can you walk comfortably? limited by knee pain   Diagnostic tests OA in Rt hip, no imaging of low back.  Osteoporosis  Patient Stated Goals return to walking, improve posture   Currently in Pain? Yes   Pain Score 6    Pain Location Back   Pain Orientation Lower   Pain Descriptors / Indicators Aching   Pain Type Acute pain   Pain Onset More than a month ago   Pain Frequency Intermittent   Aggravating Factors  lifting, standing    Pain Relieving Factors rest   Multiple Pain Sites Yes   Pain Score 5   Pain Location Knee   Pain Orientation Right   Pain Descriptors / Indicators Aching;Sharp   Pain Type Chronic pain   Pain Onset More than a month ago   Pain Frequency Intermittent   Aggravating Factors  weightbear activities   Pain Relieving Factors off feet             [facility] PT Assessment - 12/12/16 0001      Assessment   Medical Diagnosis low bone mass, osteoporosis   Referring Provider Dr. Delrae Rend   Onset Date/Surgical Date 06/30/16   Prior Therapy none     Precautions   Precautions Other (comment)   Precaution Comments osteoporosis     Restrictions   Weight Bearing Restrictions No     Prior Function   Level of Independence Independent   Vocation Part time employment   Environmental health practitioner- desk work   Leisure walk for exercise, travel     Observation/Other Assessments   Focus on Therapeutic Outcomes (FOTO)  49% limitation     Posture/Postural Control   Posture/Postural Control Postural limitations   Postural Limitations Flexed trunk;Forward head;Rounded Shoulders;Increased thoracic kyphosis     Strength   Right Hip Flexion 4+/5   Right Hip ABduction 4-/5   Left Hip Flexion 4+/5   Left Hip Extension 4-/5   Left Hip ABduction 4-/5                     [facility] Adult PT Treatment/Exercise - 12/12/16 0001      Self-Care   Self-Care Other Self-Care Comments   Other Self-Care Comments  using heat for back, pain cream, laying down to rest back     Neck Exercises: Machines for Strengthening   UBE (Upper Arm Bike) level 6 (3'/3')     Lumbar Exercises: Standing   Other Standing Lumbar Exercises go up and down steps 2 times but increased right knee pain     Lumbar Exercises: Supine   Clam 15 reps  with bridge; green band   Bent Knee Raise 20 reps   Bent Knee Raise Limitations abdominal bracing   Bridge 10 reps   Other Supine Lumbar Exercises horizontal shoulder abduction red band 10x then diagonals 10x each way   Other Supine Lumbar Exercises Dead bug with 2# weight on ankles; hookly move yellow plyoball side to side, up/down, diagonalsl                  PT Short Term Goals - 10/25/16 1237      PT SHORT TERM GOAL #1   Title be independent in initial HEP   Time 4   Period Weeks   Status  Achieved     PT SHORT TERM GOAL #2   Title demonstrate improved seated posture and report postural corrections at home and work   Time 4   Period Weeks   Status Achieved     PT SHORT TERM GOAL #3   Title verbalize and demonstrate understanding of body mechanics modifications  for lumbar protection and osteoprosis precautions   Time 4   Period Weeks   Status Achieved     PT SHORT TERM GOAL #4   Title report a 25% reduction in LBP with home tasks   Time 4   Period Weeks   Status Achieved           PT Long Term Goals - January 01, 2017 1021      PT LONG TERM GOAL #1   Title be independent in advanced HEP   Baseline still learning   Time 8   Period Weeks   Status On-going     PT LONG TERM GOAL #2   Title reduce FOTO to < or = to 27% limitation   Baseline 39% limitation   Time 8   Period Weeks   Status On-going     PT LONG TERM GOAL #3   Title report a 50% reduction in LBP with home tasks   Time 8   Period Weeks   Status On-going     PT LONG TERM GOAL #4   Title report no falls at home or in the community and 50% improved confidence with balance   Time 8   Period Weeks   Status Achieved     PT LONG TERM GOAL #5   Title Dynamic Gait Index score >/= 22 due to improve balance   Time 8   Period Weeks   Status On-going               Plan - 2017/01/01 1018    Clinical Impression Statement Patient had increased right knee pain today therefore did not do weightbear activties today.  Patient back pain decreased after therapy.  Patient is able to exercise with a strong core.  Patient is having injections into his knees to help with the pain.  Patient has good strength in hips.  Patient will benfit from skilled therapy to improve balance to decreases riks of falls due to osteroporosis.    Rehab Potential Good   PT Frequency 2x / week   PT Duration 8 weeks   PT Treatment/Interventions ADLs/Self Care Home Management;Cryotherapy;Electrical Stimulation;Functional mobility  training;Gait training;Moist Heat;Therapeutic activities;Therapeutic exercise;Balance training;Neuromuscular re-education;Patient/family education;Passive range of motion;Manual techniques;Taping   PT Next Visit Plan  balance with standing on black foam and walking with smaller base of support   PT Home Exercise Plan progress as needed   Consulted and Agree with Plan of Care Patient      Patient will benefit from skilled therapeutic intervention in order to improve the following deficits and impairments:  Decreased activity tolerance, Decreased strength, Postural dysfunction, Improper body mechanics, Impaired flexibility, Decreased range of motion, Decreased endurance, Pain  Visit Diagnosis: Muscle weakness (generalized)  Abnormal posture  Chronic left-sided low back pain without sciatica       G-Codes - Jan 01, 2017 1013    Functional Assessment Tool Used (Outpatient Only) FOTO: 49% limitation   Functional Limitation Other PT primary   Other PT Primary Current Status (M0867) At least 40 percent but less than 60 percent impaired, limited or restricted   Other PT Primary Goal Status (Y1950) At least 20 percent but less than 40 percent impaired, limited or restricted      Problem List Patient Active Problem List   Diagnosis Date Noted  . Tremor, essential 12/03/2015  . Essential hypertension 12/09/2014  . Coronary artery disease involving native heart 12/08/2013  . 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[place]) 12/08/2013  . Hyperlipidemia 12/08/2013  . Benign prostatic  hypertrophy 08/08/2013  . Essential and other specified forms of tremor 11/25/2012    Earlie Counts, PT 12/12/16 10:22 AM   [facility] [address] [place], [place], [address] Phone: [phone]   Fax:  [phone]  Name: Cole Martinez MRN: [id number] Date of Birth: 12/16/1945

## 2016-12-13 ENCOUNTER — Encounter: Payer: Self-pay | Admitting: Interventional Cardiology

## 2016-12-13 ENCOUNTER — Ambulatory Visit (INDEPENDENT_AMBULATORY_CARE_PROVIDER_SITE_OTHER): Payer: Medicare Other | Admitting: Interventional Cardiology

## 2016-12-13 ENCOUNTER — Telehealth (HOSPITAL_COMMUNITY): Payer: Self-pay | Admitting: *Deleted

## 2016-12-13 VITALS — BP 126/74 | HR 85 | Ht 67.0 in | Wt 193.4 lb

## 2016-12-13 DIAGNOSIS — C8300 Small cell B-cell lymphoma, unspecified site: Secondary | ICD-10-CM

## 2016-12-13 DIAGNOSIS — R0602 Shortness of breath: Secondary | ICD-10-CM | POA: Diagnosis not present

## 2016-12-13 DIAGNOSIS — E782 Mixed hyperlipidemia: Secondary | ICD-10-CM | POA: Diagnosis not present

## 2016-12-13 DIAGNOSIS — R079 Chest pain, unspecified: Secondary | ICD-10-CM | POA: Diagnosis not present

## 2016-12-13 DIAGNOSIS — I10 Essential (primary) hypertension: Secondary | ICD-10-CM | POA: Diagnosis not present

## 2016-12-13 DIAGNOSIS — I2510 Atherosclerotic heart disease of native coronary artery without angina pectoris: Secondary | ICD-10-CM | POA: Diagnosis not present

## 2016-12-13 NOTE — Telephone Encounter (Signed)
Patient given detailed instructions per Myocardial Perfusion Study Information Sheet for the test on 12/18/16. Patient notified to arrive 15 minutes early and that it is imperative to arrive on time for appointment to keep from having the test rescheduled.  If you need to cancel or reschedule your appointment, please call the office within 24 hours of your appointment. . Patient verbalized understanding. Kirstie Peri

## 2016-12-13 NOTE — Patient Instructions (Signed)
Medication Instructions:  Your physician recommends that you continue on your current medications as directed. Please refer to the Current Medication list given to you today.  Labwork: None  Testing/Procedures: Your physician has requested that you have a lexiscan myoview. For further information please visit [email or web address]. Please follow instruction sheet, as given.   Follow-Up: Your physician wants you to follow-up in: 1 year with Dr. Tamala Julian.  You will receive a reminder letter in the mail two months in advance. If you don't receive a letter, please call our office to schedule the follow-up appointment.   Any Other Special Instructions Will Be Listed Below (If Applicable).     If you need a refill on your cardiac medications before your next appointment, please call your pharmacy.

## 2016-12-14 ENCOUNTER — Ambulatory Visit: Payer: Medicare Other | Admitting: Physical Therapy

## 2016-12-14 ENCOUNTER — Encounter: Payer: Self-pay | Admitting: Physical Therapy

## 2016-12-14 DIAGNOSIS — G8929 Other chronic pain: Secondary | ICD-10-CM

## 2016-12-14 DIAGNOSIS — R293 Abnormal posture: Secondary | ICD-10-CM

## 2016-12-14 DIAGNOSIS — M6281 Muscle weakness (generalized): Secondary | ICD-10-CM

## 2016-12-14 DIAGNOSIS — M545 Low back pain, unspecified: Secondary | ICD-10-CM

## 2016-12-14 NOTE — Therapy (Signed)
[facility] [address], [place], [place], [address] Phone: [phone]   Fax:  [phone]  Physical Therapy Treatment  Patient Details  Name: Cole Martinez MRN: [id number] Date of Birth: 10/27/1945 Referring Provider: Dr. Delrae Rend  Encounter Date: 12/14/2016      PT End of Session - 12/14/16 1011    Visit Number 21   Date for PT Re-Evaluation 12/20/16   Authorization Type medicare g-code on 20th visit; KX modifier at 15th visit   PT Start Time 0930   PT Stop Time 1008   PT Time Calculation (min) 38 min   Activity Tolerance Patient tolerated treatment well   Behavior During Therapy [facility] for tasks assessed/performed      Past Medical History:  Diagnosis Date  . ADHD (attention deficit hyperactivity disorder)   . B12 deficiency   . BPH (benign prostatic hypertrophy)   . 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 Coronary artery disease CARDIOLOGIST- DR Daneen Schick   S/P STENTING LAD 1999  . Crohn's disease of ileum ([place]) SINCE 1988  . ED (erectile dysfunction)   . Elevated PSA   . Essential and other specified forms of tremor 11/25/2012  . H/O adenomatous polyp of colon   . Hyperlipidemia   . Nocturia   . OA (osteoarthritis)   . Peripheral neuropathy    hx of, none current as of 08-04-13  . Peyronie disease   . S/P coronary artery stent placement OCT 1999 OF LAD  . Tremor, hereditary, benign MILD RIGHT HAND    Past Surgical History:  Procedure Laterality Date  . CARDIOVASCULAR STRESS TEST  11-01-2010 DR Daneen Schick   NORMAL PERFUSION STUDY/ EF 64%/ NO ISCHEMIA  . COLONOSCOPY WITH PROPOFOL N/A 09/24/2012   Procedure: COLONOSCOPY WITH PROPOFOL;  Surgeon: [name], MD;  Location: [facility];  Service: Endoscopy;  Laterality: N/A;  . CORONARY ANGIOPLASTY WITH STENT PLACEMENT  OCT 1999   STENT OF LAD  . LAPAROSCOPIC INGUINAL HERNIA REPAIR Bilateral 01-10-2004   W/ MESH  . neck benign removed from neck  yrs ago  . PROSTATE BIOPSY N/A 07/12/2012   Procedure: PROSTATE BIOPSY AND ULTRASOUND;  Surgeon: Ailene Rud, MD;  Location: [facility];  Service: Urology;  Laterality: N/A;  . PROSTATE SURGERY  2002   tuna  . REMOVAL LEFT NECK LYMPH NODE  1996  . TONSILLECTOMY  CHILD  . TRANSURETHRAL RESECTION OF PROSTATE N/A 08/08/2013   Procedure: TRANSURETHRAL RESECTION OF THE PROSTATE WITH GYRUS INSTRUMENTS;  Surgeon: Ailene Rud, MD;  Location[facility];  Service: Urology;  Laterality: N/A;  . TRANSURETHRAL RESECTION OF PROSTATE      There were no vitals filed for this visit.      Subjective Assessment - 12/14/16 0932    Subjective I saw the cardiologist yesterday and I am having a nuclear stress test on Monday. The EKG was fine. I see the doctor about an injection tomorrow.    Pertinent History osteoporosis: t score -1.5, considering Rt knee replacement   How long can you stand comfortably? limited by knee pain   How long can you walk comfortably? limited by knee pain   Diagnostic tests OA in Rt hip, no imaging of low back.  Osteoporosis    Patient Stated Goals return to walking, improve posture   Currently in Pain? No/denies  [facility] Adult PT Treatment/Exercise - 12/14/16 0001      Neck Exercises: Machines for Strengthening   UBE (Upper Arm Bike) level 6 (3'/3')     Lumbar Exercises: Standing   Other Standing Lumbar Exercises go up and down steps 2 times but increased right knee pain  therapist crowded patient     Knee/Hip Exercises: Standing   Rebounder stand with feet together eyes closed 15 sec; bounce 1 min   Gait Training figure 8 around poles each way   Other Standing Knee Exercises walking between width of tile to work on balance; tandem walk 20 feet 2 times; walk and pivot turn   Other Standing  Knee Exercises walk through ladder forward marching and sidestepping                  PT Short Term Goals - 10/25/16 1237      PT SHORT TERM GOAL #1   Title be independent in initial HEP   Time 4   Period Weeks   Status Achieved     PT SHORT TERM GOAL #2   Title demonstrate improved seated posture and report postural corrections at home and work   Time 4   Period Weeks   Status Achieved     PT SHORT TERM GOAL #3   Title verbalize and demonstrate understanding of body mechanics modifications for lumbar protection and osteoprosis precautions   Time 4   Period Weeks   Status Achieved     PT SHORT TERM GOAL #4   Title report a 25% reduction in LBP with home tasks   Time 4   Period Weeks   Status Achieved           PT Long Term Goals - 12/12/16 1021      PT LONG TERM GOAL #1   Title be independent in advanced HEP   Baseline still learning   Time 8   Period Weeks   Status On-going     PT LONG TERM GOAL #2   Title reduce FOTO to < or = to 27% limitation   Baseline 39% limitation   Time 8   Period Weeks   Status On-going     PT LONG TERM GOAL #3   Title report a 50% reduction in LBP with home tasks   Time 8   Period Weeks   Status On-going     PT LONG TERM GOAL #4   Title report no falls at home or in the community and 50% improved confidence with balance   Time 8   Period Weeks   Status Achieved     PT LONG TERM GOAL #5   Title Dynamic Gait Index score >/= 22 due to improve balance   Time 8   Period Weeks   Status On-going               Plan - 12/14/16 1013    Clinical Impression Statement Patient balance was improved today with no loss of balance during exercise. Patient was able to go up and down steps without holding on when crowded.  Patient will benefit from skilled therapy to improve balance and core strength to reduce risk of falls.    Rehab Potential Good   PT Frequency 2x / week   PT Duration 8 weeks   PT  Treatment/Interventions ADLs/Self Care Home Management;Cryotherapy;Electrical Stimulation;Functional mobility training;Gait training;Moist Heat;Therapeutic activities;Therapeutic exercise;Balance training;Neuromuscular re-education;Patient/family education;Passive range of motion;Manual techniques;Taping   PT Next Visit Plan Discharge next  visit   PT Home Exercise Plan progress as needed   Consulted and Agree with Plan of Care Patient      Patient will benefit from skilled therapeutic intervention in order to improve the following deficits and impairments:  Decreased activity tolerance, Decreased strength, Postural dysfunction, Improper body mechanics, Impaired flexibility, Decreased range of motion, Decreased endurance, Pain  Visit Diagnosis: Muscle weakness (generalized)  Abnormal posture  Chronic left-sided low back pain without sciatica     Problem List Patient Active Problem List   Diagnosis Date Noted  . Tremor, essential 12/03/2015  . Essential hypertension 12/09/2014  . Coronary artery disease involving native heart 12/08/2013  . 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[place]) 12/08/2013  . Hyperlipidemia 12/08/2013  . Benign prostatic hypertrophy 08/08/2013  . Essential and other specified forms of tremor 11/25/2012    Earlie Counts, PT 12/14/16 10:22 AM   [facility] [address] [place], [place], [address] Phone: [phone]   Fax:  [phone]  Name: SKYE RODARTE MRN: [id number] Date of Birth: May 19, 1945

## 2016-12-15 DIAGNOSIS — M1711 Unilateral primary osteoarthritis, right knee: Secondary | ICD-10-CM | POA: Diagnosis not present

## 2016-12-18 ENCOUNTER — Ambulatory Visit (HOSPITAL_COMMUNITY): Payer: Medicare Other | Attending: Cardiovascular Disease

## 2016-12-18 DIAGNOSIS — I10 Essential (primary) hypertension: Secondary | ICD-10-CM | POA: Insufficient documentation

## 2016-12-18 DIAGNOSIS — I2510 Atherosclerotic heart disease of native coronary artery without angina pectoris: Secondary | ICD-10-CM | POA: Insufficient documentation

## 2016-12-18 DIAGNOSIS — R0602 Shortness of breath: Secondary | ICD-10-CM

## 2016-12-18 DIAGNOSIS — R0609 Other forms of dyspnea: Secondary | ICD-10-CM | POA: Insufficient documentation

## 2016-12-18 DIAGNOSIS — R079 Chest pain, unspecified: Secondary | ICD-10-CM

## 2016-12-18 LAB — MYOCARDIAL PERFUSION IMAGING
LV dias vol: 94 mL (ref 62–150)
LV sys vol: 40 mL
Peak HR: 87 {beats}/min
RATE: 0.23
Rest HR: 69 {beats}/min
SDS: 1
SRS: 3
SSS: 4
TID: 0.88

## 2016-12-18 MED ORDER — REGADENOSON 0.4 MG/5ML IV SOLN
0.4000 mg | Freq: Once | INTRAVENOUS | Status: AC
  Administered 2016-12-18: 0.4 mg via INTRAVENOUS

## 2016-12-18 MED ORDER — TECHNETIUM TC 99M TETROFOSMIN IV KIT
31.7000 | PACK | Freq: Once | INTRAVENOUS | Status: AC | PRN
  Administered 2016-12-18: 31.7 via INTRAVENOUS
  Filled 2016-12-18: qty 32

## 2016-12-18 MED ORDER — TECHNETIUM TC 99M TETROFOSMIN IV KIT
10.8000 | PACK | Freq: Once | INTRAVENOUS | Status: AC | PRN
  Administered 2016-12-18: 10.8 via INTRAVENOUS
  Filled 2016-12-18: qty 11

## 2016-12-19 ENCOUNTER — Ambulatory Visit: Payer: Medicare Other | Admitting: Physical Therapy

## 2016-12-19 DIAGNOSIS — M6281 Muscle weakness (generalized): Secondary | ICD-10-CM | POA: Diagnosis not present

## 2016-12-19 DIAGNOSIS — G8929 Other chronic pain: Secondary | ICD-10-CM | POA: Diagnosis not present

## 2016-12-19 DIAGNOSIS — R293 Abnormal posture: Secondary | ICD-10-CM | POA: Diagnosis not present

## 2016-12-19 DIAGNOSIS — M545 Low back pain: Secondary | ICD-10-CM

## 2016-12-19 NOTE — Therapy (Signed)
[facility] [address], [place], [place], [address] Phone: [phone]   Fax:  [phone]  Physical Therapy Treatment  Patient Details  Name: Cole Martinez MRN: [id number] Date of Birth: 05-24-1945 Referring Provider: Dr. Delrae Rend  Encounter Date: 12/19/2016      PT End of Session - 12/19/16 1003    Visit Number 22   Date for PT Re-Evaluation 12/20/16   Authorization Type medicare g-code on 20th visit; KX modifier at 15th visit   PT Start Time 0930   PT Stop Time 1004   PT Time Calculation (min) 34 min   Activity Tolerance Patient tolerated treatment well   Behavior During Therapy [facility] for tasks assessed/performed      Past Medical History:  Diagnosis Date  . ADHD (attention deficit hyperactivity disorder)   . B12 deficiency   . BPH (benign prostatic hypertrophy)   . 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 Coronary artery disease CARDIOLOGIST- DR Daneen Schick   S/P STENTING LAD 1999  . Crohn's disease of ileum ([place]) SINCE 1988  . ED (erectile dysfunction)   . Elevated PSA   . Essential and other specified forms of tremor 11/25/2012  . H/O adenomatous polyp of colon   . Hyperlipidemia   . Nocturia   . OA (osteoarthritis)   . Peripheral neuropathy    hx of, none current as of 08-04-13  . Peyronie disease   . S/P coronary artery stent placement OCT 1999 OF LAD  . Tremor, hereditary, benign MILD RIGHT HAND    Past Surgical History:  Procedure Laterality Date  . CARDIOVASCULAR STRESS TEST  11-01-2010 DR Daneen Schick   NORMAL PERFUSION STUDY/ EF 64%/ NO ISCHEMIA  . COLONOSCOPY WITH PROPOFOL N/A 09/24/2012   Procedure: COLONOSCOPY WITH PROPOFOL;  Surgeon: [name], MD;  [facility];  Service: Endoscopy;  Laterality: N/A;  . CORONARY ANGIOPLASTY WITH STENT PLACEMENT  OCT 1999   STENT OF LAD  . LAPAROSCOPIC INGUINAL HERNIA REPAIR Bilateral 01-10-2004   W/ MESH  . neck benign removed from neck  yrs ago  . PROSTATE BIOPSY N/A 07/12/2012   Procedure: PROSTATE BIOPSY AND ULTRASOUND;  Surgeon: Ailene Rud, MD;  Location: [facility];  Service: Urology;  Laterality: N/A;  . PROSTATE SURGERY  2002   tuna  . REMOVAL LEFT NECK LYMPH NODE  1996  . TONSILLECTOMY  CHILD  . TRANSURETHRAL RESECTION OF PROSTATE N/A 08/08/2013   Procedure: TRANSURETHRAL RESECTION OF THE PROSTATE WITH GYRUS INSTRUMENTS;  Surgeon: Ailene Rud, MD;  [facility];  Service: Urology;  Laterality: N/A;  . TRANSURETHRAL RESECTION OF PROSTATE      There were no vitals filed for this visit.          [facility] PT Assessment - 12/19/16 0001      Assessment   Medical Diagnosis low bone mass, osteoporosis   Referring Provider Dr. Delrae Rend   Onset Date/Surgical Date 06/30/16   Prior Therapy none     Precautions   Precautions Other (comment)   Precaution Comments osteoporosis     Restrictions   Weight Bearing Restrictions No     Prior Function   Level of Independence Independent   Vocation Part time employment   Environmental health practitioner- desk work   Leisure walk for exercise, travel     Observation/Other Assessments  Focus on Therapeutic Outcomes (FOTO)  35% limitation     Strength   Right Hip Flexion 4+/5   Right Hip ABduction 4+/5   Left Hip Flexion 4+/5   Left Hip Extension 4-/5   Left Hip ABduction 4/5     Dynamic Gait Index   Level Surface Normal   Change in Gait Speed Normal   Gait with Horizontal Head Turns Normal   Gait with Vertical Head Turns Normal   Gait and Pivot Turn Normal   Step Over Obstacle Normal   Step Around Obstacles Normal   Steps Normal   Total Score 24                     [facility] Adult PT Treatment/Exercise - 12/19/16 0001      Neck Exercises: Machines for Strengthening   UBE (Upper Arm Bike) level 6  (3'/3')                PT Education - 12/19/16 1003    Education provided Yes   Education Details reviewed past HEP and his gym workout with the personal trainer and progress his balance   Person(s) Educated Patient   Methods Explanation   Comprehension Verbalized understanding          PT Short Term Goals - 10/25/16 1237      PT SHORT TERM GOAL #1   Title be independent in initial HEP   Time 4   Period Weeks   Status Achieved     PT SHORT TERM GOAL #2   Title demonstrate improved seated posture and report postural corrections at home and work   Time 4   Period Weeks   Status Achieved     PT SHORT TERM GOAL #3   Title verbalize and demonstrate understanding of body mechanics modifications for lumbar protection and osteoprosis precautions   Time 4   Period Weeks   Status Achieved     PT SHORT TERM GOAL #4   Title report a 25% reduction in LBP with home tasks   Time 4   Period Weeks   Status Achieved           PT Long Term Goals - 12/19/16 1007      PT LONG TERM GOAL #1   Title be independent in advanced HEP   Time 8   Period Weeks   Status Achieved     PT LONG TERM GOAL #2   Title reduce FOTO to < or = to 27% limitation   Baseline 35% limitation   Time 8   Period Weeks   Status Not Met     PT LONG TERM GOAL #3   Title report a 50% reduction in LBP with home tasks   Time 8   Period Weeks   Status Achieved     PT LONG TERM GOAL #4   Title report no falls at home or in the community and 50% improved confidence with balance   Time 8   Period Weeks   Status Achieved     PT LONG TERM GOAL #5   Title Dynamic Gait Index score >/= 22 due to improve balance   Baseline 24/24   Time 8   Period Weeks   Status Achieved               Plan - 12/19/16 1004    Clinical Impression Statement Patient has met his goals.  He is able to go up and down stairs  with increased control.  Dynamic balance score improved to 24/24. Patient has increased  hip strength.  Patient has decreased back pain due to increased trunk strength.  Patient understands information on osteoporosis and how to protect his spine. Patient is ready for discharge.    Rehab Potential Good   PT Treatment/Interventions ADLs/Self Care Home Management;Cryotherapy;Electrical Stimulation;Functional mobility training;Gait training;Moist Heat;Therapeutic activities;Therapeutic exercise;Balance training;Neuromuscular re-education;Patient/family education;Passive range of motion;Manual techniques;Taping   PT Next Visit Plan Discharge to HEP   PT Home Exercise Plan Current HEP   Consulted and Agree with Plan of Care Patient      Patient will benefit from skilled therapeutic intervention in order to improve the following deficits and impairments:  Decreased activity tolerance, Decreased strength, Postural dysfunction, Improper body mechanics, Impaired flexibility, Decreased range of motion, Decreased endurance, Pain  Visit Diagnosis: Muscle weakness (generalized)  Abnormal posture  Chronic left-sided low back pain without sciatica       G-Codes - 01-05-2017 0956    Functional Assessment Tool Used (Outpatient Only) FOTO: 35% limitation   Functional Limitation Other PT primary   Other PT Primary Goal Status (T0211) At least 20 percent but less than 40 percent impaired, limited or restricted   Other PT Primary Discharge Status (Z7356) At least 20 percent but less than 40 percent impaired, limited or restricted      Problem List Patient Active Problem List   Diagnosis Date Noted  . Tremor, essential 12/03/2015  . Essential hypertension 12/09/2014  . Coronary artery disease involving native heart 12/08/2013  . 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[place]) 12/08/2013  . Hyperlipidemia 12/08/2013  . Benign prostatic hypertrophy 08/08/2013  . Essential and other specified forms of tremor 11/25/2012    Earlie Counts, PT 2017/01/05 10:10 AM   [facility] [address] [place], [place], [address] Phone: [phone]   Fax:  [phone]  Name: GRAEDEN BITNER MRN: [id number] Date of Birth: 02-15-1946  PHYSICAL THERAPY DISCHARGE SUMMARY  Visits from Start of Care: 22  Current functional level related to goals / functional outcomes: See above.    Remaining deficits: See above   Education / Equipment: HEP  Plan: Patient agrees to discharge.  Patient goals were met. Patient is being discharged due to meeting the stated rehab goals. Thank you for the referral. Earlie Counts, PT 01-05-17 10:10 AM   ?????

## 2016-12-20 ENCOUNTER — Telehealth: Payer: Self-pay

## 2016-12-20 DIAGNOSIS — Z23 Encounter for immunization: Secondary | ICD-10-CM | POA: Diagnosis not present

## 2016-12-20 NOTE — Telephone Encounter (Signed)
spoke with patient about stress test results. patient verbalized understanding.  patient thanked me for my call.

## 2016-12-21 ENCOUNTER — Ambulatory Visit: Payer: Medicare Other | Admitting: Physical Therapy

## 2017-01-22 DIAGNOSIS — E7800 Pure hypercholesterolemia, unspecified: Secondary | ICD-10-CM | POA: Diagnosis not present

## 2017-01-22 DIAGNOSIS — R29898 Other symptoms and signs involving the musculoskeletal system: Secondary | ICD-10-CM | POA: Diagnosis not present

## 2017-01-22 DIAGNOSIS — F900 Attention-deficit hyperactivity disorder, predominantly inattentive type: Secondary | ICD-10-CM | POA: Diagnosis not present

## 2017-01-22 DIAGNOSIS — M791 Myalgia, unspecified site: Secondary | ICD-10-CM | POA: Diagnosis not present

## 2017-02-08 DIAGNOSIS — M170 Bilateral primary osteoarthritis of knee: Secondary | ICD-10-CM | POA: Diagnosis not present

## 2017-02-18 ENCOUNTER — Encounter (HOSPITAL_COMMUNITY): Payer: Self-pay | Admitting: *Deleted

## 2017-02-18 DIAGNOSIS — Z87891 Personal history of nicotine dependence: Secondary | ICD-10-CM | POA: Insufficient documentation

## 2017-02-18 DIAGNOSIS — Y999 Unspecified external cause status: Secondary | ICD-10-CM | POA: Diagnosis not present

## 2017-02-18 DIAGNOSIS — Z7982 Long term (current) use of aspirin: Secondary | ICD-10-CM | POA: Diagnosis not present

## 2017-02-18 DIAGNOSIS — X58XXXA Exposure to other specified factors, initial encounter: Secondary | ICD-10-CM | POA: Insufficient documentation

## 2017-02-18 DIAGNOSIS — Y939 Activity, unspecified: Secondary | ICD-10-CM | POA: Insufficient documentation

## 2017-02-18 DIAGNOSIS — Z85828 Personal history of other malignant neoplasm of skin: Secondary | ICD-10-CM | POA: Diagnosis not present

## 2017-02-18 DIAGNOSIS — Y929 Unspecified place or not applicable: Secondary | ICD-10-CM | POA: Insufficient documentation

## 2017-02-18 DIAGNOSIS — I2510 Atherosclerotic heart disease of native coronary artery without angina pectoris: Secondary | ICD-10-CM | POA: Diagnosis not present

## 2017-02-18 DIAGNOSIS — S00512A Abrasion of oral cavity, initial encounter: Secondary | ICD-10-CM | POA: Insufficient documentation

## 2017-02-18 DIAGNOSIS — I10 Essential (primary) hypertension: Secondary | ICD-10-CM | POA: Diagnosis not present

## 2017-02-18 NOTE — ED Triage Notes (Signed)
Pt says that he was dinner around 9pm and his tongue started bleeding. He says that he has fissures on his tongue and it does sometimes get irritated when he eats spicy foods, but has never has had bleeding. Pt has a large clot on the center of his tongue, but still having some bleeding. Denies blood thinners.

## 2017-02-19 ENCOUNTER — Emergency Department (HOSPITAL_COMMUNITY)
Admission: EM | Admit: 2017-02-19 | Discharge: 2017-02-19 | Disposition: A | Discharge status: Home / Self Care | Payer: Medicare Other | Attending: Emergency Medicine | Admitting: Emergency Medicine

## 2017-02-19 DIAGNOSIS — S00512A Abrasion of oral cavity, initial encounter: Secondary | ICD-10-CM

## 2017-02-19 NOTE — ED Notes (Signed)
Pt says bleeding has now stopped, discussed for him to still wait if he can to see provider in treatment room

## 2017-02-19 NOTE — ED Provider Notes (Signed)
[facility] DEPT Provider Note   CSN: [id number] Arrival date & time: 02/18/17  2314     History   Chief Complaint Chief Complaint  Patient presents with  . Tongue Bleeding    HPI Cole Martinez is a 71 y.o. male.  Patient is a 71 year old male with past medical history of CLL, ADHD.  He presents today for evaluation of tongue bleeding.  He reports that he has fissures on his tongue that occasionally bleed.  This time it began bleeding and took approximately 2-1/2 hours to stop.  He denies any other complaints.  It stopped shortly before triage here.   The history is provided by the patient.    Past Medical History:  Diagnosis Date  . ADHD (attention deficit hyperactivity disorder)   . B12 deficiency   . BPH (benign prostatic hypertrophy)   . 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 Coronary artery disease CARDIOLOGIST- DR Daneen Schick   S/P STENTING LAD 1999  . Crohn's disease of ileum ([place]) SINCE 1988  . ED (erectile dysfunction)   . Elevated PSA   . Essential and other specified forms of tremor 11/25/2012  . H/O adenomatous polyp of colon   . Hyperlipidemia   . Nocturia   . OA (osteoarthritis)   . Peripheral neuropathy    hx of, none current as of 08-04-13  . Peyronie disease   . S/P coronary artery stent placement OCT 1999 OF LAD  . Tremor, hereditary, benign MILD RIGHT HAND    Patient Active Problem List   Diagnosis Date Noted  . Tremor, essential 12/03/2015  . Essential hypertension 12/09/2014  . Coronary artery disease involving native heart 12/08/2013  . 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[place]) 12/08/2013  . Hyperlipidemia 12/08/2013  . Benign prostatic hypertrophy 08/08/2013  . Essential and other specified forms of tremor 11/25/2012    Past Surgical History:  Procedure Laterality Date  . CARDIOVASCULAR STRESS TEST   11-01-2010 DR Daneen Schick   NORMAL PERFUSION STUDY/ EF 64%/ NO ISCHEMIA  . COLONOSCOPY WITH PROPOFOL N/A 09/24/2012   Procedure: COLONOSCOPY WITH PROPOFOL;  Surgeon: [name], MD;  [facility];  Service: Endoscopy;  Laterality: N/A;  . CORONARY ANGIOPLASTY WITH STENT PLACEMENT  OCT 1999   STENT OF LAD  . LAPAROSCOPIC INGUINAL HERNIA REPAIR Bilateral 01-10-2004   W/ MESH  . neck benign removed from neck  yrs ago  . PROSTATE BIOPSY N/A 07/12/2012   Procedure: PROSTATE BIOPSY AND ULTRASOUND;  Surgeon: Ailene Rud, MD;  Location: [facility];  Service: Urology;  Laterality: N/A;  . PROSTATE SURGERY  2002   tuna  . REMOVAL LEFT NECK LYMPH NODE  1996  . TONSILLECTOMY  CHILD  . TRANSURETHRAL RESECTION OF PROSTATE N/A 08/08/2013   Procedure: TRANSURETHRAL RESECTION OF THE PROSTATE WITH GYRUS INSTRUMENTS;  Surgeon: Ailene Rud, MD;  [facility];  Service: Urology;  Laterality: N/A;  . TRANSURETHRAL RESECTION OF PROSTATE         Home Medications    Prior to Admission medications   Medication Sig Start Date End Date Taking? Authorizing Provider  amLODipine (NORVASC) 2.5 MG tablet TAKE 1 TABLET BY MOUTH  DAILY 12/13/16  Yes Belva Crome, MD  amphetamine-dextroamphetamine (ADDERALL) 5 MG tablet Take 5 mg by mouth 2 (two) times daily.   Yes [provider]  aspirin EC 81  MG tablet Take 81 mg by mouth daily.   Yes [provider]  celecoxib (CELEBREX) 200 MG capsule Take 200 mg by mouth daily.   Yes [provider]  Coenzyme Q10 (COQ10) 200 MG CAPS Take 200 mg by mouth daily.   Yes [provider]  Cyanocobalamin 2500 MCG CHEW Chew 1 tablet by mouth daily.    Yes [provider]  omeprazole (PRILOSEC) 20 MG capsule Take 20 mg by mouth daily.   Yes [provider]  thiamine 100 MG tablet Take 100 mg by mouth daily.   Yes [provider]  VITAMIN E PO Take 1 tablet by mouth daily.     Yes [provider]    Family History Family History  Problem Relation Age of Onset  . Obesity Brother   . Diabetes Brother   . Parkinsonism Brother     Social History Social History   Tobacco Use  . Smoking status: Former Smoker    Packs/day: 1.00    Years: 10.00    Pack years: 10.00    Types: Cigarettes    Last attempt to quit: 07/09/1984    Years since quitting: 32.6  . Smokeless tobacco: Never Used  Substance Use Topics  . Alcohol use: Yes    Alcohol/week: 1.0 oz    Types: 2 Standard drinks or equivalent per week    Comment: daily 1 per day  . Drug use: No     Allergies   Patient has no known allergies.   Review of Systems Review of Systems  All other systems reviewed and are negative.    Physical Exam Updated Vital Signs BP (!) 176/88 (BP Location: Left Arm)   Pulse 78   Temp 97.8 F (36.6 C) (Oral)   Resp 16   SpO2 99%   Physical Exam  Constitutional: He is oriented to person, place, and time. He appears well-developed and well-nourished. No distress.  HENT:  Head: Normocephalic and atraumatic.  There is a small fissure to the dorsum of the tongue.  There is a small area of clotted blood, but no active bleeding.  Neck: Normal range of motion. Neck supple.  Neurological: He is alert and oriented to person, place, and time.  Skin: Skin is warm and dry. He is not diaphoretic.  Nursing note and vitals reviewed.    ED Treatments / Results  Labs (all labs ordered are listed, but only abnormal results are displayed) Labs Reviewed - No data to display  EKG  EKG Interpretation None       Radiology No results found.  Procedures Procedures (including critical care time)  Medications Ordered in ED Medications - No data to display   Initial Impression / Assessment and Plan / ED Course  I have reviewed the triage vital signs and the nursing notes.  Pertinent labs & imaging results that were available during my care of the patient  were reviewed by me and considered in my medical decision making (see chart for details).  Bleeding stopped with time.  I see no indication for laboratory studies, cauterization, or other intervention.  I will advised him to apply direct pressure if the bleeding recurs and return as needed for any problems.  Final Clinical Impressions(s) / ED Diagnoses   Final diagnoses:  None    ED Discharge Orders    None       Veryl Speak, MD 02/19/17 [phone]

## 2017-02-19 NOTE — Progress Notes (Signed)
Need orders in epic for 1-7 surgery pre op is 12-27

## 2017-02-19 NOTE — Discharge Instructions (Signed)
Apply direct pressure with gauze for 15 minutes if bleeding recurs.  Return to the ER for any new problems or concerns.

## 2017-02-20 ENCOUNTER — Ambulatory Visit: Payer: Self-pay | Admitting: Orthopedic Surgery

## 2017-02-20 ENCOUNTER — Telehealth: Payer: Self-pay | Admitting: *Deleted

## 2017-02-20 NOTE — Telephone Encounter (Signed)
   [facility] Pre-operative Risk Assessment    Request for surgical clearance:  1. What type of surgery is being performed? RIGHT: TKA-MEDIAL & LATERAL W/WO PATELLA RESURFACING   2. When is this surgery scheduled? 03/12/2017  3. Are there any medications that need to be held prior to surgery and how long? PT IS ON ASA  4. Practice name and name of physician performing surgery? [facility]; DR. Wynelle Link  5. What is your office phone and fax number? FAX # [phone], PH# [phone]   4. Anesthesia type (None, local, MAC, general) ?    Julaine Hua 02/20/2017, 2:20 PM  _________________________________________________________________   (provider comments below)

## 2017-02-21 DIAGNOSIS — Z79899 Other long term (current) drug therapy: Secondary | ICD-10-CM | POA: Diagnosis not present

## 2017-02-21 DIAGNOSIS — R29898 Other symptoms and signs involving the musculoskeletal system: Secondary | ICD-10-CM | POA: Diagnosis not present

## 2017-02-21 DIAGNOSIS — M1711 Unilateral primary osteoarthritis, right knee: Secondary | ICD-10-CM | POA: Diagnosis not present

## 2017-02-21 DIAGNOSIS — M791 Myalgia, unspecified site: Secondary | ICD-10-CM | POA: Diagnosis not present

## 2017-02-21 DIAGNOSIS — E7800 Pure hypercholesterolemia, unspecified: Secondary | ICD-10-CM | POA: Diagnosis not present

## 2017-02-21 DIAGNOSIS — F900 Attention-deficit hyperactivity disorder, predominantly inattentive type: Secondary | ICD-10-CM | POA: Diagnosis not present

## 2017-02-28 ENCOUNTER — Ambulatory Visit: Payer: Self-pay | Admitting: Orthopedic Surgery

## 2017-02-28 ENCOUNTER — Other Ambulatory Visit (HOSPITAL_COMMUNITY): Payer: Self-pay | Admitting: Emergency Medicine

## 2017-02-28 ENCOUNTER — Telehealth: Payer: Self-pay | Admitting: Neurology

## 2017-02-28 NOTE — Patient Instructions (Signed)
Cole Martinez  02/28/2017   Your procedure is scheduled on: 03-12-17  Report to [facility] Main  Entrance    Follow signs to Short Stay on first floor at 5:30AM    Call this number if you have problems the morning of surgery [phone]   Remember: ONLY 1 PERSON MAY GO WITH YOU TO SHORT STAY TO GET  READY MORNING OF Cole Martinez.  Do not eat food or drink liquids :After Midnight.     Take these medicines the morning of surgery with A SIP OF WATER: amlodipine, omeprazole                                 You may not have any metal on your body including hair pins and              piercings  Do not wear jewelry, make-up, lotions, powders or perfumes, deodorant              Men may shave face and neck.   Do not bring valuables to the hospital. [place].  Contacts, dentures or bridgework may not be worn into surgery.  Leave suitcase in the car. After surgery it may be brought to your room.                Please read over the following fact sheets you were given: _____________________________________________________________________            [facility] - Preparing for Surgery Before surgery, you can play an important role.  Because skin is not sterile, your skin needs to be as free of germs as possible.  You can reduce the number of germs on your skin by washing with CHG (chlorahexidine gluconate) soap before surgery.  CHG is an antiseptic cleaner which kills germs and bonds with the skin to continue killing germs even after washing. Please DO NOT use if you have an allergy to CHG or antibacterial soaps.  If your skin becomes reddened/irritated stop using the CHG and inform your nurse when you arrive at Short Stay. Do not shave (including legs and underarms) for at least 48 hours prior to the first CHG shower.  You may shave your face/neck. Please follow these instructions carefully:  1.  Shower with CHG  Soap the night before surgery and the  morning of Surgery.  2.  If you choose to wash your hair, wash your hair first as usual with your  normal  shampoo.  3.  After you shampoo, rinse your hair and body thoroughly to remove the  shampoo.                           4.  Use CHG as you would any other liquid soap.  You can apply chg directly  to the skin and wash                       Gently with a scrungie or clean washcloth.  5.  Apply the CHG Soap to your body ONLY FROM THE NECK DOWN.   Do not use on face/ open  Wound or open sores. Avoid contact with eyes, ears mouth and genitals (private parts).                       Wash face,  Genitals (private parts) with your normal soap.             6.  Wash thoroughly, paying special attention to the area where your surgery  will be performed.  7.  Thoroughly rinse your body with warm water from the neck down.  8.  DO NOT shower/wash with your normal soap after using and rinsing off  the CHG Soap.                9.  Pat yourself dry with a clean towel.            10.  Wear clean pajamas.            11.  Place clean sheets on your bed the night of your first shower and do not  sleep with pets. Day of Surgery : Do not apply any lotions/deodorants the morning of surgery.  Please wear clean clothes to the hospital/surgery center.  FAILURE TO FOLLOW THESE INSTRUCTIONS MAY RESULT IN THE CANCELLATION OF YOUR SURGERY PATIENT SIGNATURE_________________________________  NURSE SIGNATURE__________________________________  ________________________________________________________________________   Cole Martinez  An incentive spirometer is a tool that can help keep your lungs clear and active. This tool measures how well you are filling your lungs with each breath. Taking long deep breaths may help reverse or decrease the chance of developing breathing (pulmonary) problems (especially infection) following:  A long period of time  when you are unable to move or be active. BEFORE THE PROCEDURE   If the spirometer includes an indicator to show your best effort, your nurse or respiratory therapist will set it to a desired goal.  If possible, sit up straight or lean slightly forward. Try not to slouch.  Hold the incentive spirometer in an upright position. INSTRUCTIONS FOR USE  1. Sit on the edge of your bed if possible, or sit up as far as you can in bed or on a chair. 2. Hold the incentive spirometer in an upright position. 3. Breathe out normally. 4. Place the mouthpiece in your mouth and seal your lips tightly around it. 5. Breathe in slowly and as deeply as possible, raising the piston or the ball toward the top of the column. 6. Hold your breath for 3-5 seconds or for as long as possible. Allow the piston or ball to fall to the bottom of the column. 7. Remove the mouthpiece from your mouth and breathe out normally. 8. Rest for a few seconds and repeat Steps 1 through 7 at least 10 times every 1-2 hours when you are awake. Take your time and take a few normal breaths between deep breaths. 9. The spirometer may include an indicator to show your best effort. Use the indicator as a goal to work toward during each repetition. 10. After each set of 10 deep breaths, practice coughing to be sure your lungs are clear. If you have an incision (the cut made at the time of surgery), support your incision when coughing by placing a pillow or rolled up towels firmly against it. Once you are able to get out of bed, walk around indoors and cough well. You may stop using the incentive spirometer when instructed by your caregiver.  RISKS AND COMPLICATIONS  Take your time so you do not get  dizzy or light-headed.  If you are in pain, you may need to take or ask for pain medication before doing incentive spirometry. It is harder to take a deep breath if you are having pain. AFTER USE  Rest and breathe slowly and easily.  It can be  helpful to keep track of a log of your progress. Your caregiver can provide you with a simple table to help with this. If you are using the spirometer at home, follow these instructions: [place] IF:   You are having difficultly using the spirometer.  You have trouble using the spirometer as often as instructed.  Your pain medication is not giving enough relief while using the spirometer.  You develop fever of 100.5 F (38.1 C) or higher. SEEK IMMEDIATE MEDICAL CARE IF:   You cough up bloody sputum that had not been present before.  You develop fever of 102 F (38.9 C) or greater.  You develop worsening pain at or near the incision site. MAKE SURE YOU:   Understand these instructions.  Will watch your condition.  Will get help right away if you are not doing well or get worse. Document Released: 07/03/2006 Document Revised: 05/15/2011 Document Reviewed: 09/03/2006 ExitCare Patient Information 2014 ExitCare, [place].   ________________________________________________________________________  WHAT IS A BLOOD TRANSFUSION? Blood Transfusion Information  A transfusion is the replacement of blood or some of its parts. Blood is made up of multiple cells which provide different functions.  Red blood cells carry oxygen and are used for blood loss replacement.  White blood cells fight against infection.  Platelets control bleeding.  Plasma helps clot blood.  Other blood products are available for specialized needs, such as hemophilia or other clotting disorders. BEFORE THE TRANSFUSION  Who gives blood for transfusions?   Healthy volunteers who are fully evaluated to make sure their blood is safe. This is blood bank blood. Transfusion therapy is the safest it has ever been in the practice of medicine. Before blood is taken from a donor, a complete history is taken to make sure that person has no history of diseases nor engages in risky social behavior (examples are  intravenous drug use or sexual activity with multiple partners). The donor's travel history is screened to minimize risk of transmitting infections, such as malaria. The donated blood is tested for signs of infectious diseases, such as HIV and hepatitis. The blood is then tested to be sure it is compatible with you in order to minimize the chance of a transfusion reaction. If you or a relative donates blood, this is often done in anticipation of surgery and is not appropriate for emergency situations. It takes many days to process the donated blood. RISKS AND COMPLICATIONS Although transfusion therapy is very safe and saves many lives, the main dangers of transfusion include:   Getting an infectious disease.  Developing a transfusion reaction. This is an allergic reaction to something in the blood you were given. Every precaution is taken to prevent this. The decision to have a blood transfusion has been considered carefully by your caregiver before blood is given. Blood is not given unless the benefits outweigh the risks. AFTER THE TRANSFUSION  Right after receiving a blood transfusion, you will usually feel much better and more energetic. This is especially true if your red blood cells have gotten low (anemic). The transfusion raises the level of the red blood cells which carry oxygen, and this usually causes an energy increase.  The nurse administering the transfusion will  monitor you carefully for complications. HOME CARE INSTRUCTIONS  No special instructions are needed after a transfusion. You may find your energy is better. Speak with your caregiver about any limitations on activity for underlying diseases you may have. SEEK MEDICAL CARE IF:   Your condition is not improving after your transfusion.  You develop redness or irritation at the intravenous (IV) site. SEEK IMMEDIATE MEDICAL CARE IF:  Any of the following symptoms occur over the next 12 hours:  Shaking chills.  You have a  temperature by mouth above 102 F (38.9 C), not controlled by medicine.  Chest, back, or muscle pain.  People around you feel you are not acting correctly or are confused.  Shortness of breath or difficulty breathing.  Dizziness and fainting.  You get a rash or develop hives.  You have a decrease in urine output.  Your urine turns a dark color or changes to pink, red, or brown. Any of the following symptoms occur over the next 10 days:  You have a temperature by mouth above 102 F (38.9 C), not controlled by medicine.  Shortness of breath.  Weakness after normal activity.  The white part of the eye turns yellow (jaundice).  You have a decrease in the amount of urine or are urinating less often.  Your urine turns a dark color or changes to pink, red, or brown. Document Released: 02/18/2000 Document Revised: 05/15/2011 Document Reviewed: 10/07/2007 [facility] Patient Information 2014 [place], [place].  _______________________________________________________________________

## 2017-02-28 NOTE — Progress Notes (Signed)
Stress test 12-18-16 epic  ekg 12-13-16 epic  LOV cardiology Dr Daneen Schick 12-13-16 epic

## 2017-02-28 NOTE — Telephone Encounter (Signed)
Pt has called and asked if a message could be sent directly to Dr Jannifer Franklin (as a result of their friendship) to see if Dr Jannifer Franklin can work him in.  Pt states that Dr Jannifer Franklin can either call or text him a response.

## 2017-02-28 NOTE — Telephone Encounter (Signed)
I called the patient.  The patient has had worsening problems with tremor, he is to have January, he would like to get in before the surgery.  We only have 1.5 days of patient scheduling to work with, if we have a cancellation, we will try to get him in.

## 2017-03-01 ENCOUNTER — Encounter (HOSPITAL_COMMUNITY)
Admission: RE | Admit: 2017-03-01 | Discharge: 2017-03-01 | Disposition: A | Discharge status: Home / Self Care | Payer: Medicare Other | Source: Ambulatory Visit | Attending: Orthopedic Surgery | Admitting: Orthopedic Surgery

## 2017-03-01 ENCOUNTER — Telehealth: Payer: Self-pay | Admitting: Interventional Cardiology

## 2017-03-01 ENCOUNTER — Other Ambulatory Visit: Payer: Self-pay | Admitting: *Deleted

## 2017-03-01 ENCOUNTER — Encounter (HOSPITAL_COMMUNITY): Payer: Self-pay

## 2017-03-01 ENCOUNTER — Other Ambulatory Visit: Payer: Self-pay

## 2017-03-01 DIAGNOSIS — M1711 Unilateral primary osteoarthritis, right knee: Secondary | ICD-10-CM | POA: Diagnosis not present

## 2017-03-01 DIAGNOSIS — Z01812 Encounter for preprocedural laboratory examination: Secondary | ICD-10-CM | POA: Diagnosis not present

## 2017-03-01 LAB — CBC
HCT: 43.6 % (ref 39.0–52.0)
Hemoglobin: 14.5 g/dL (ref 13.0–17.0)
MCH: 30.2 pg (ref 26.0–34.0)
MCHC: 33.3 g/dL (ref 30.0–36.0)
MCV: 90.8 fL (ref 78.0–100.0)
Platelets: 250 10*3/uL (ref 150–400)
RBC: 4.8 MIL/uL (ref 4.22–5.81)
RDW: 13.8 % (ref 11.5–15.5)
WBC: 6.5 10*3/uL (ref 4.0–10.5)

## 2017-03-01 LAB — COMPREHENSIVE METABOLIC PANEL
ALT: 16 U/L — ABNORMAL LOW (ref 17–63)
AST: 30 U/L (ref 15–41)
Albumin: 4 g/dL (ref 3.5–5.0)
Alkaline Phosphatase: 73 U/L (ref 38–126)
Anion gap: 7 (ref 5–15)
BUN: 21 mg/dL — ABNORMAL HIGH (ref 6–20)
CO2: 27 mmol/L (ref 22–32)
Calcium: 8.7 mg/dL — ABNORMAL LOW (ref 8.9–10.3)
Chloride: 107 mmol/L (ref 101–111)
Creatinine, Ser: 0.99 mg/dL (ref 0.61–1.24)
GFR calc Af Amer: >60 mL/min (ref >60)
GFR calc non Af Amer: >60 mL/min (ref >60)
Glucose, Bld: 101 mg/dL — ABNORMAL HIGH (ref 65–99)
Potassium: 4.6 mmol/L (ref 3.5–5.1)
Sodium: 141 mmol/L (ref 135–145)
Total Bilirubin: 0.6 mg/dL (ref 0.3–1.2)
Total Protein: 6.5 g/dL (ref 6.5–8.1)

## 2017-03-01 LAB — SURGICAL PCR SCREEN
MRSA, PCR: NEGATIVE
Staphylococcus aureus: POSITIVE — AB

## 2017-03-01 LAB — TYPE AND SCREEN
ABO/RH(D): A POS
Antibody Screen: NEGATIVE

## 2017-03-01 LAB — APTT: aPTT: 24 seconds (ref 24–36)

## 2017-03-01 LAB — ABO/RH: ABO/RH(D): A POS

## 2017-03-01 LAB — PROTIME-INR
INR: 0.98
Prothrombin Time: 12.9 seconds (ref 11.4–15.2)

## 2017-03-01 NOTE — Telephone Encounter (Signed)
   Primary Cardiologist: Dr. Tamala Julian   Chart reviewed as part of pre-operative protocol coverage. Patient was contacted 03/01/2017 in reference to pre-operative risk assessment for pending surgery as outlined below.  Cole Martinez was last seen on 12/13/2016 by Dr. Tamala Julian.  Since that day, Cole Martinez has done well and denies any recent chest pain or dyspnea on exertion. Stress test on 12/18/2016 was low-risk and showed no evidence of ischemia.  Therefore, based on ACC/AHA guidelines, the patient would be at acceptable risk for the planned procedure without further cardiovascular testing.   I will route this recommendation to the requesting party via Epic fax function and remove from pre-op pool.  Please call with questions.  Erma Heritage, PA-C 03/01/2017, 2:09 PM

## 2017-03-01 NOTE — Telephone Encounter (Signed)
New message    Patient calling for status of pre-op clearance.  Please call

## 2017-03-01 NOTE — Telephone Encounter (Signed)
F/U Call: Patient would like to know the status of pre-op   [facility] Pre-operative Risk Assessment    Request for surgical clearance:  1. What type of surgery is being performed? RIGHT: TKA-MEDIAL & LATERAL W/WO PATELLA RESURFACING   2. When is this surgery scheduled? 03/12/2017  3. Are there any medications that need to be held prior to surgery and how long? PT IS ON ASA  4. Practice name and name of physician performing surgery? [facility]; DR. Wynelle Link  5. What is your office phone and fax number? FAX # [phone], PH# [phone]   4. Anesthesia type (None, local, MAC, general) ?    Julaine Hua 02/20/2017, 2:20 PM  _________________________________________________________________   (provider comments below)

## 2017-03-01 NOTE — Progress Notes (Signed)
Clearance Dr Inda Merlin on chart

## 2017-03-01 NOTE — Telephone Encounter (Signed)
F/U Call: Patient asks that surgical clearance form be faxed to  [facility]) [phone] instead of the fax number listed on form  and patient would like a copy faxed to him as well [phone])

## 2017-03-02 NOTE — Telephone Encounter (Signed)
Called patient. Advised CW,MD did not have an cx this week. Offered appt 03/14/16 at 330pm. Pt declined, he is having knee surgery 03/12/17 and will not be able to take appt. Made appt for 04/11/17 at [place] with CW,MD. He verbalized understanding and appreciation for call.

## 2017-03-02 NOTE — Progress Notes (Signed)
Cardiac clearance, see Erma Heritage, PA-C telephone note in epic dated 03-01-17

## 2017-03-11 ENCOUNTER — Ambulatory Visit: Payer: Self-pay | Admitting: Orthopedic Surgery

## 2017-03-11 NOTE — H&P (Signed)
Name Cole Martinez, Cole Martinez (71yo, M)  DOB 1945/12/31  Chief Complaint - right knee pain  Vitals 03/09/2017 11:25 am BMI: 29.8 Ht: 5 ft 7 in  Wt: 190 lbs   Allergies Reviewed Allergies NKDA  Medications  amLODIPine 2.5 mg tablet  Aspir-81  atorvastatin 20 mg tablet  celecoxib 200 mg capsule  colchicine 0.6 mg tablet  dextroamphetamine-amphetamine 5 mg tablet  PriLOSEC  Vitamin B-12  Vitamin D3 1,000 unit capsule              Family History Reviewed Family History Father - Family history of cancer Mother - Heart disease Brother - Diabetes mellitus   - Parkinson's disease  Social History Reviewed Social History Smoking Status: Former smoker Occupation: Engineer, maintenance ([employer]) Alcohol intake: Occasional (Notes: One cocktail) Advance directive: Y (Notes: Living Will) Medical Power of Attorney: Y  Surgical History Reviewed Surgical History Hernia repair Transurethral prostatectomy  Past Medical History Reviewed Past Medical History GI Issues Specify:: Y - Chron's Disease Heart Problems: Y - Coronary Disease Hypertension: Y Notes: Heart Stenting x 1, Prostate Disease, Leukemia (CLL)  HPI Patient is a 72 yo male being admitted to [facility] for a Right Total Knee Replacement to be performed by Dr. Wynelle Link on Mar 12, 2017. The patient is a 72 year old male who presented for follow up of their knee. The patient is being followed for their right (worse than left) knee pain and osteoarthritis. They are now months out from Euflexxa series (in the right knee). Symptoms reported  include: pain, aching, instability (mainly in the left) and difficulty ambulating. The patient feels that they are doing poorly and report their pain level to be mild to moderate (depending on activity). Current treatment includes: NSAIDs (Celebrex every morning; he states that he takes some occasional Advil as well). The patient has not gotten any relief of their symptoms with viscosupplementation. The  patient indicates that they have questions or concerns today regarding pain and their progress at this point. His RIGHT knee is getting progressively worse. He is having pain with almost all activities and even having pain at rest. He would like to be more active but the knee is preventing him from doing so. The injections have not helped. He is at a stage where he would like to have a more permanent solution to this knee.  He is ready to proceed with surgery. Risks and benefits of the surgery have been discussed with the patient and they elect to proceed with surgery.  There are on active contraindications to upcoming procedure such as ongoing infection or progressive neurological disease.   ROS Constitutional: Constitutional: fatigue.  HEENT: Eyes: no irritation, dry eyes, vision change, or sore throat.  Respiratory: Respiratory: no cough or shortness of breath and No COPD.  Gastrointestinal: Gastrointestinal: no vomiting or diarrhea and not vomiting blood.  Genitourinary: Genitourinary: urinary frequency.  Musculoskeletal: Musculoskeletal: Joint Pain; back pain, morning stiffness, muscular weakness.  Neurologic: Neurologic: tremors.  Physical Exam Patient is a 72 year old male.  General Mental Status - Alert, cooperative and good historian. General Appearance - pleasant, Not in acute distress. Orientation - Oriented X3. Build & Nutrition - Well nourished and Well developed.  Head and Neck Head - normocephalic, atraumatic . Neck Global Assessment - supple, no bruit auscultated on the right, no bruit auscultated on the left.  Eye Pupil - Bilateral - PERR Motion - Bilateral - EOMI.  Chest and Lung Exam Auscultation Breath sounds - clear at anterior chest wall and clear at  posterior chest wall. Adventitious sounds - No Adventitious sounds.  Cardiovascular Auscultation Rhythm - Regular rate and rhythm. Heart Sounds - S1 WNL and S2 WNL. Murmurs & Other Heart Sounds -  Auscultation of the heart reveals - No Murmurs.  Abdomen Palpation/Percussion Tenderness - Abdomen is non-tender to palpation. Abdomen is soft. Auscultation Auscultation of the abdomen reveals - Bowel sounds normal.  Male Genitourinary Note: Not done, not pertinent to present illness  Musculoskeletal His RIGHT knee shows no effusion. He has a slight varus deformity. His range of motion is 5-125. There is moderate crepitus on range of motion. He is tender medial greater than lateral with no instability. LEFT knee shows no effusion with range 0-135. There is no tenderness or instability. Gait pattern is slightly antalgic on the RIGHT.  Assessment / Plan 1. Osteoarthritis of knee M17.11: Unilateral primary osteoarthritis, right knee   Discussion Notes Plan - Right Total Knee Arthroplasty Surgery on 03/12/2017 Topical TXA No Anesthesia Issues Straight to Outpatient Therapy  Return to Office PATRICIA POFF, DPT for 5-Physical Therapy Evaluation at [facility] on 03/16/2017 at 08:30 AM Gaynelle Arabian, MD for 5-Post-Op at [facility] on 03/27/2017 at 01:15 PM

## 2017-03-11 NOTE — H&P (Deleted)
  The note originally documented on this encounter has been moved the the encounter in which it belongs.  

## 2017-03-12 ENCOUNTER — Inpatient Hospital Stay (HOSPITAL_COMMUNITY): Payer: Medicare Other | Admitting: Anesthesiology

## 2017-03-12 ENCOUNTER — Encounter (HOSPITAL_COMMUNITY)
Admission: RE | Disposition: A | Discharge status: Home / Self Care | Payer: Self-pay | Source: Ambulatory Visit | Attending: Orthopedic Surgery

## 2017-03-12 ENCOUNTER — Inpatient Hospital Stay (HOSPITAL_COMMUNITY)
Admission: RE | Admit: 2017-03-12 | Discharge: 2017-03-14 | DRG: 470 | Disposition: A | Discharge status: Home / Self Care | Payer: Medicare Other | Source: Ambulatory Visit | Attending: Orthopedic Surgery | Admitting: Orthopedic Surgery

## 2017-03-12 ENCOUNTER — Encounter (HOSPITAL_COMMUNITY): Payer: Self-pay | Admitting: *Deleted

## 2017-03-12 ENCOUNTER — Other Ambulatory Visit: Payer: Self-pay

## 2017-03-12 DIAGNOSIS — I10 Essential (primary) hypertension: Secondary | ICD-10-CM | POA: Diagnosis present

## 2017-03-12 DIAGNOSIS — M179 Osteoarthritis of knee, unspecified: Secondary | ICD-10-CM | POA: Diagnosis present

## 2017-03-12 DIAGNOSIS — K5000 Crohn's disease of small intestine without complications: Secondary | ICD-10-CM | POA: Diagnosis not present

## 2017-03-12 DIAGNOSIS — F909 Attention-deficit hyperactivity disorder, unspecified type: Secondary | ICD-10-CM | POA: Diagnosis present

## 2017-03-12 DIAGNOSIS — N320 Bladder-neck obstruction: Secondary | ICD-10-CM | POA: Diagnosis not present

## 2017-03-12 DIAGNOSIS — N35919 Unspecified urethral stricture, male, unspecified site: Secondary | ICD-10-CM | POA: Diagnosis not present

## 2017-03-12 DIAGNOSIS — N400 Enlarged prostate without lower urinary tract symptoms: Secondary | ICD-10-CM | POA: Diagnosis present

## 2017-03-12 DIAGNOSIS — Z79899 Other long term (current) drug therapy: Secondary | ICD-10-CM | POA: Diagnosis not present

## 2017-03-12 DIAGNOSIS — Z856 Personal history of leukemia: Secondary | ICD-10-CM

## 2017-03-12 DIAGNOSIS — M1711 Unilateral primary osteoarthritis, right knee: Principal | ICD-10-CM | POA: Diagnosis present

## 2017-03-12 DIAGNOSIS — E785 Hyperlipidemia, unspecified: Secondary | ICD-10-CM | POA: Diagnosis present

## 2017-03-12 DIAGNOSIS — N99111 Postprocedural bulbous urethral stricture: Secondary | ICD-10-CM

## 2017-03-12 DIAGNOSIS — Z955 Presence of coronary angioplasty implant and graft: Secondary | ICD-10-CM

## 2017-03-12 DIAGNOSIS — M1710 Unilateral primary osteoarthritis, unspecified knee: Secondary | ICD-10-CM

## 2017-03-12 DIAGNOSIS — Z7982 Long term (current) use of aspirin: Secondary | ICD-10-CM | POA: Diagnosis not present

## 2017-03-12 DIAGNOSIS — G8918 Other acute postprocedural pain: Secondary | ICD-10-CM | POA: Diagnosis not present

## 2017-03-12 DIAGNOSIS — Z87891 Personal history of nicotine dependence: Secondary | ICD-10-CM | POA: Diagnosis not present

## 2017-03-12 DIAGNOSIS — I2510 Atherosclerotic heart disease of native coronary artery without angina pectoris: Secondary | ICD-10-CM | POA: Diagnosis not present

## 2017-03-12 HISTORY — PX: TOTAL KNEE ARTHROPLASTY: SHX125

## 2017-03-12 HISTORY — PX: CYSTOSCOPY WITH URETHRAL DILATATION: SHX5125

## 2017-03-12 SURGERY — ARTHROPLASTY, KNEE, TOTAL
Anesthesia: Regional | Site: Knee | Laterality: Right

## 2017-03-12 MED ORDER — METHOCARBAMOL 1000 MG/10ML IJ SOLN
500.0000 mg | Freq: Four times a day (QID) | INTRAMUSCULAR | Status: DC | PRN
  Administered 2017-03-12: 500 mg via INTRAVENOUS
  Filled 2017-03-12: qty 550

## 2017-03-12 MED ORDER — DIPHENHYDRAMINE HCL 12.5 MG/5ML PO ELIX
12.5000 mg | ORAL_SOLUTION | ORAL | Status: DC | PRN
  Administered 2017-03-12: 25 mg via ORAL
  Filled 2017-03-12: qty 10

## 2017-03-12 MED ORDER — DEXAMETHASONE SODIUM PHOSPHATE 10 MG/ML IJ SOLN
10.0000 mg | Freq: Once | INTRAMUSCULAR | Status: AC
  Administered 2017-03-13: 10 mg via INTRAVENOUS
  Filled 2017-03-12: qty 1

## 2017-03-12 MED ORDER — ONDANSETRON HCL 4 MG/2ML IJ SOLN: 4.0000 mg | Freq: Once | INTRAMUSCULAR | Status: DC | PRN

## 2017-03-12 MED ORDER — PHENYLEPHRINE HCL 10 MG/ML IJ SOLN
INTRAMUSCULAR | Status: DC | PRN
  Administered 2017-03-12: 120 ug via INTRAVENOUS
  Administered 2017-03-12: 160 ug via INTRAVENOUS
  Administered 2017-03-12: 120 ug via INTRAVENOUS

## 2017-03-12 MED ORDER — MIDAZOLAM HCL 2 MG/2ML IJ SOLN
INTRAMUSCULAR | Status: AC
  Filled 2017-03-12: qty 2

## 2017-03-12 MED ORDER — TRANEXAMIC ACID 1000 MG/10ML IV SOLN
INTRAVENOUS | Status: AC | PRN
  Administered 2017-03-12: 2000 mg via TOPICAL

## 2017-03-12 MED ORDER — METHOCARBAMOL 500 MG PO TABS
500.0000 mg | ORAL_TABLET | Freq: Four times a day (QID) | ORAL | Status: DC | PRN
  Administered 2017-03-13 – 2017-03-14 (×4): 500 mg via ORAL
  Filled 2017-03-12 (×5): qty 1

## 2017-03-12 MED ORDER — GABAPENTIN 300 MG PO CAPS
300.0000 mg | ORAL_CAPSULE | Freq: Once | ORAL | Status: AC
  Administered 2017-03-12: 300 mg via ORAL
  Filled 2017-03-12: qty 1

## 2017-03-12 MED ORDER — MORPHINE SULFATE (PF) 2 MG/ML IV SOLN: 1.0000 mg | INTRAVENOUS | Status: DC | PRN

## 2017-03-12 MED ORDER — MIDAZOLAM HCL 5 MG/5ML IJ SOLN
INTRAMUSCULAR | Status: DC | PRN
  Administered 2017-03-12: 2 mg via INTRAVENOUS

## 2017-03-12 MED ORDER — SODIUM CHLORIDE 0.9 % IR SOLN
Status: DC | PRN
  Administered 2017-03-12: 2000 mL

## 2017-03-12 MED ORDER — ROPIVACAINE HCL 7.5 MG/ML IJ SOLN
INTRAMUSCULAR | Status: DC | PRN
  Administered 2017-03-12: 20 mL via PERINEURAL

## 2017-03-12 MED ORDER — OXYCODONE HCL 5 MG PO TABS
10.0000 mg | ORAL_TABLET | ORAL | Status: DC | PRN
  Administered 2017-03-12 – 2017-03-14 (×9): 10 mg via ORAL
  Filled 2017-03-12 (×10): qty 2

## 2017-03-12 MED ORDER — TRANEXAMIC ACID 1000 MG/10ML IV SOLN
2000.0000 mg | Freq: Once | INTRAVENOUS | Status: DC
  Filled 2017-03-12: qty 20

## 2017-03-12 MED ORDER — ACETAMINOPHEN 650 MG RE SUPP: 650.0000 mg | RECTAL | Status: DC | PRN

## 2017-03-12 MED ORDER — PHENYLEPHRINE HCL 10 MG/ML IJ SOLN
INTRAVENOUS | Status: DC | PRN
  Administered 2017-03-12: 30 ug/min via INTRAVENOUS

## 2017-03-12 MED ORDER — BISACODYL 10 MG RE SUPP: 10.0000 mg | Freq: Every day | RECTAL | Status: DC | PRN

## 2017-03-12 MED ORDER — CEFAZOLIN SODIUM-DEXTROSE 2-4 GM/100ML-% IV SOLN
2.0000 g | INTRAVENOUS | Status: AC
  Administered 2017-03-12: 2 g via INTRAVENOUS
  Filled 2017-03-12: qty 100

## 2017-03-12 MED ORDER — PANTOPRAZOLE SODIUM 40 MG PO TBEC
40.0000 mg | DELAYED_RELEASE_TABLET | Freq: Every day | ORAL | Status: DC
  Administered 2017-03-13 – 2017-03-14 (×2): 40 mg via ORAL
  Filled 2017-03-12 (×2): qty 1

## 2017-03-12 MED ORDER — OXYCODONE HCL 5 MG PO TABS
5.0000 mg | ORAL_TABLET | ORAL | Status: DC | PRN
  Administered 2017-03-12 – 2017-03-13 (×4): 5 mg via ORAL
  Filled 2017-03-12 (×3): qty 1

## 2017-03-12 MED ORDER — ONDANSETRON HCL 4 MG PO TABS: 4.0000 mg | ORAL_TABLET | Freq: Four times a day (QID) | ORAL | Status: DC | PRN

## 2017-03-12 MED ORDER — FENTANYL CITRATE (PF) 250 MCG/5ML IJ SOLN
INTRAMUSCULAR | Status: AC
  Filled 2017-03-12: qty 5

## 2017-03-12 MED ORDER — FENTANYL CITRATE (PF) 100 MCG/2ML IJ SOLN
INTRAMUSCULAR | Status: AC
  Filled 2017-03-12: qty 2

## 2017-03-12 MED ORDER — MENTHOL 3 MG MT LOZG: 1.0000 | LOZENGE | OROMUCOSAL | Status: DC | PRN

## 2017-03-12 MED ORDER — FENTANYL CITRATE (PF) 100 MCG/2ML IJ SOLN: 25.0000 ug | INTRAMUSCULAR | Status: DC | PRN

## 2017-03-12 MED ORDER — BUPIVACAINE LIPOSOME 1.3 % IJ SUSP
INTRAMUSCULAR | Status: DC | PRN
  Administered 2017-03-12 (×2): 10 mL

## 2017-03-12 MED ORDER — DEXAMETHASONE SODIUM PHOSPHATE 10 MG/ML IJ SOLN
10.0000 mg | Freq: Once | INTRAMUSCULAR | Status: AC
  Administered 2017-03-12: 10 mg via INTRAVENOUS

## 2017-03-12 MED ORDER — PHENOL 1.4 % MT LIQD: 1.0000 | OROMUCOSAL | Status: DC | PRN

## 2017-03-12 MED ORDER — SODIUM CHLORIDE 0.9 % IV SOLN
INTRAVENOUS | Status: DC
  Administered 2017-03-12 (×2): via INTRAVENOUS

## 2017-03-12 MED ORDER — CEFAZOLIN SODIUM-DEXTROSE 2-4 GM/100ML-% IV SOLN
2.0000 g | Freq: Four times a day (QID) | INTRAVENOUS | Status: AC
  Administered 2017-03-12 (×2): 2 g via INTRAVENOUS
  Filled 2017-03-12 (×2): qty 100

## 2017-03-12 MED ORDER — RIVAROXABAN 10 MG PO TABS
10.0000 mg | ORAL_TABLET | Freq: Every day | ORAL | Status: DC
  Administered 2017-03-13 – 2017-03-14 (×2): 10 mg via ORAL
  Filled 2017-03-12 (×2): qty 1

## 2017-03-12 MED ORDER — AMLODIPINE BESYLATE 5 MG PO TABS
2.5000 mg | ORAL_TABLET | Freq: Every day | ORAL | Status: DC
  Administered 2017-03-13 – 2017-03-14 (×2): 2.5 mg via ORAL
  Filled 2017-03-12 (×3): qty 1

## 2017-03-12 MED ORDER — FENTANYL CITRATE (PF) 100 MCG/2ML IJ SOLN
INTRAMUSCULAR | Status: DC | PRN
  Administered 2017-03-12 (×2): 50 ug via INTRAVENOUS
  Administered 2017-03-12: 100 ug via INTRAVENOUS

## 2017-03-12 MED ORDER — ROCURONIUM BROMIDE 50 MG/5ML IV SOSY
PREFILLED_SYRINGE | INTRAVENOUS | Status: AC
  Filled 2017-03-12: qty 5

## 2017-03-12 MED ORDER — SUGAMMADEX SODIUM 200 MG/2ML IV SOLN
INTRAVENOUS | Status: DC | PRN
  Administered 2017-03-12: 150 mg via INTRAVENOUS

## 2017-03-12 MED ORDER — SODIUM CHLORIDE 0.9 % IJ SOLN
INTRAMUSCULAR | Status: AC
  Filled 2017-03-12: qty 10

## 2017-03-12 MED ORDER — DEXAMETHASONE SODIUM PHOSPHATE 10 MG/ML IJ SOLN
INTRAMUSCULAR | Status: AC
  Filled 2017-03-12: qty 1

## 2017-03-12 MED ORDER — SUGAMMADEX SODIUM 200 MG/2ML IV SOLN
INTRAVENOUS | Status: AC
  Filled 2017-03-12: qty 2

## 2017-03-12 MED ORDER — PROPOFOL 10 MG/ML IV BOLUS
INTRAVENOUS | Status: DC | PRN
  Administered 2017-03-12: 110 mg via INTRAVENOUS

## 2017-03-12 MED ORDER — ACETAMINOPHEN 500 MG PO TABS
1000.0000 mg | ORAL_TABLET | Freq: Four times a day (QID) | ORAL | Status: AC
  Administered 2017-03-12 – 2017-03-13 (×4): 1000 mg via ORAL
  Filled 2017-03-12 (×4): qty 2

## 2017-03-12 MED ORDER — ONDANSETRON HCL 4 MG/2ML IJ SOLN
INTRAMUSCULAR | Status: AC
  Filled 2017-03-12: qty 2

## 2017-03-12 MED ORDER — METOCLOPRAMIDE HCL 5 MG/ML IJ SOLN: 5.0000 mg | Freq: Three times a day (TID) | INTRAMUSCULAR | Status: DC | PRN

## 2017-03-12 MED ORDER — DOCUSATE SODIUM 100 MG PO CAPS
100.0000 mg | ORAL_CAPSULE | Freq: Two times a day (BID) | ORAL | Status: DC
  Administered 2017-03-12 – 2017-03-14 (×4): 100 mg via ORAL
  Filled 2017-03-12 (×4): qty 1

## 2017-03-12 MED ORDER — BUPIVACAINE LIPOSOME 1.3 % IJ SUSP
20.0000 mL | Freq: Once | INTRAMUSCULAR | Status: DC
  Filled 2017-03-12: qty 20

## 2017-03-12 MED ORDER — PHENYLEPHRINE 40 MCG/ML (10ML) SYRINGE FOR IV PUSH (FOR BLOOD PRESSURE SUPPORT)
PREFILLED_SYRINGE | INTRAVENOUS | Status: AC
  Filled 2017-03-12: qty 10

## 2017-03-12 MED ORDER — CHLORHEXIDINE GLUCONATE 4 % EX LIQD: 60.0000 mL | Freq: Once | CUTANEOUS | Status: DC

## 2017-03-12 MED ORDER — PROPOFOL 10 MG/ML IV BOLUS
INTRAVENOUS | Status: AC
  Filled 2017-03-12: qty 60

## 2017-03-12 MED ORDER — ACETAMINOPHEN 325 MG PO TABS
650.0000 mg | ORAL_TABLET | ORAL | Status: DC | PRN
  Administered 2017-03-13: 650 mg via ORAL
  Filled 2017-03-12: qty 2

## 2017-03-12 MED ORDER — FLEET ENEMA 7-19 GM/118ML RE ENEM: 1.0000 | ENEMA | Freq: Once | RECTAL | Status: DC | PRN

## 2017-03-12 MED ORDER — ROCURONIUM BROMIDE 100 MG/10ML IV SOLN
INTRAVENOUS | Status: DC | PRN
  Administered 2017-03-12: 10 mg via INTRAVENOUS
  Administered 2017-03-12: 40 mg via INTRAVENOUS

## 2017-03-12 MED ORDER — PHENYLEPHRINE HCL 10 MG/ML IJ SOLN
INTRAMUSCULAR | Status: AC
  Filled 2017-03-12: qty 1

## 2017-03-12 MED ORDER — SODIUM CHLORIDE 0.9 % IJ SOLN
INTRAMUSCULAR | Status: AC
  Filled 2017-03-12: qty 50

## 2017-03-12 MED ORDER — SODIUM CHLORIDE 0.9 % IJ SOLN
INTRAMUSCULAR | Status: DC | PRN
  Administered 2017-03-12 (×2): 30 mL

## 2017-03-12 MED ORDER — AMPHETAMINE-DEXTROAMPHETAMINE 10 MG PO TABS
5.0000 mg | ORAL_TABLET | Freq: Two times a day (BID) | ORAL | Status: DC
  Administered 2017-03-12 – 2017-03-14 (×4): 5 mg via ORAL
  Filled 2017-03-12 (×4): qty 1

## 2017-03-12 MED ORDER — STERILE WATER FOR IRRIGATION IR SOLN
Status: DC | PRN
  Administered 2017-03-12: 2000 mL

## 2017-03-12 MED ORDER — ONDANSETRON HCL 4 MG/2ML IJ SOLN
INTRAMUSCULAR | Status: DC | PRN
  Administered 2017-03-12: 4 mg via INTRAVENOUS

## 2017-03-12 MED ORDER — POLYETHYLENE GLYCOL 3350 17 G PO PACK: 17.0000 g | PACK | Freq: Every day | ORAL | Status: DC | PRN

## 2017-03-12 MED ORDER — 0.9 % SODIUM CHLORIDE (POUR BTL) OPTIME
TOPICAL | Status: DC | PRN
  Administered 2017-03-12: 1000 mL

## 2017-03-12 MED ORDER — LACTATED RINGERS IV SOLN
INTRAVENOUS | Status: DC
  Administered 2017-03-12 (×3): via INTRAVENOUS

## 2017-03-12 MED ORDER — METOCLOPRAMIDE HCL 5 MG PO TABS: 5.0000 mg | ORAL_TABLET | Freq: Three times a day (TID) | ORAL | Status: DC | PRN

## 2017-03-12 MED ORDER — ACETAMINOPHEN 10 MG/ML IV SOLN
1000.0000 mg | Freq: Once | INTRAVENOUS | Status: AC
  Administered 2017-03-12: 1000 mg via INTRAVENOUS
  Filled 2017-03-12: qty 100

## 2017-03-12 MED ORDER — TRAMADOL HCL 50 MG PO TABS
50.0000 mg | ORAL_TABLET | Freq: Four times a day (QID) | ORAL | Status: DC | PRN
  Administered 2017-03-13: 100 mg via ORAL
  Administered 2017-03-13: 50 mg via ORAL
  Filled 2017-03-12: qty 1
  Filled 2017-03-12: qty 2

## 2017-03-12 MED ORDER — ONDANSETRON HCL 4 MG/2ML IJ SOLN
4.0000 mg | Freq: Four times a day (QID) | INTRAMUSCULAR | Status: DC | PRN
  Administered 2017-03-14: 4 mg via INTRAVENOUS
  Filled 2017-03-12: qty 2

## 2017-03-12 SURGICAL SUPPLY — 54 items
BAG DECANTER FOR FLEXI CONT (MISCELLANEOUS) ×3 IMPLANT
BAG ZIPLOCK 12X15 (MISCELLANEOUS) ×3 IMPLANT
BANDAGE ACE 6X5 VEL STRL LF (GAUZE/BANDAGES/DRESSINGS) ×3 IMPLANT
BLADE SAG 18X100X1.27 (BLADE) ×3 IMPLANT
BLADE SAW SGTL 11.0X1.19X90.0M (BLADE) ×3 IMPLANT
BOWL SMART MIX CTS (DISPOSABLE) ×3 IMPLANT
CAPT KNEE TOTAL 3 ATTUNE ×3 IMPLANT
CATH COUDE 5CC RIBBED (CATHETERS) ×2 IMPLANT
CATH FOLEY 2W COUNCIL 5CC 16FR (CATHETERS) ×3 IMPLANT
CATH RIBBED COUDE 5CC (CATHETERS) ×1
CEMENT HV SMART SET (Cement) ×6 IMPLANT
COVER SURGICAL LIGHT HANDLE (MISCELLANEOUS) ×3 IMPLANT
CUFF TOURN SGL QUICK 34 (TOURNIQUET CUFF) ×1
CUFF TRNQT CYL 34X4X40X1 (TOURNIQUET CUFF) ×2 IMPLANT
DECANTER SPIKE VIAL GLASS SM (MISCELLANEOUS) ×3 IMPLANT
DRAPE U-SHAPE 47X51 STRL (DRAPES) ×3 IMPLANT
DRSG ADAPTIC 3X8 NADH LF (GAUZE/BANDAGES/DRESSINGS) ×3 IMPLANT
DURAPREP 26ML APPLICATOR (WOUND CARE) ×3 IMPLANT
ELECT REM PT RETURN 15FT ADLT (MISCELLANEOUS) ×3 IMPLANT
EVACUATOR 1/8 PVC DRAIN (DRAIN) ×3 IMPLANT
GAUZE SPONGE 4X4 12PLY STRL (GAUZE/BANDAGES/DRESSINGS) ×3 IMPLANT
GLOVE BIO SURGEON STRL SZ 6.5 (GLOVE) ×6 IMPLANT
GLOVE BIO SURGEON STRL SZ7.5 (GLOVE) ×3 IMPLANT
GLOVE BIO SURGEON STRL SZ8 (GLOVE) ×3 IMPLANT
GLOVE BIOGEL PI IND STRL 6.5 (GLOVE) ×2 IMPLANT
GLOVE BIOGEL PI IND STRL 7.5 (GLOVE) ×10 IMPLANT
GLOVE BIOGEL PI IND STRL 8 (GLOVE) ×2 IMPLANT
GLOVE BIOGEL PI INDICATOR 6.5 (GLOVE) ×1
GLOVE BIOGEL PI INDICATOR 7.5 (GLOVE) ×5
GLOVE BIOGEL PI INDICATOR 8 (GLOVE) ×1
GLOVE SURG SS PI 6.5 STRL IVOR (GLOVE) ×3 IMPLANT
GLOVE SURG SS PI 7.0 STRL IVOR (GLOVE) ×3 IMPLANT
GOWN STRL REUS W/TWL LRG LVL3 (GOWN DISPOSABLE) ×9 IMPLANT
GOWN STRL REUS W/TWL XL LVL3 (GOWN DISPOSABLE) ×3 IMPLANT
HANDPIECE INTERPULSE COAX TIP (DISPOSABLE) ×1
IMMOBILIZER KNEE 20 (SOFTGOODS) ×3
IMMOBILIZER KNEE 20 THIGH 36 (SOFTGOODS) ×2 IMPLANT
MANIFOLD NEPTUNE II (INSTRUMENTS) ×3 IMPLANT
PACK TOTAL KNEE CUSTOM (KITS) ×3 IMPLANT
PAD ABD 8X10 STRL (GAUZE/BANDAGES/DRESSINGS) ×3 IMPLANT
PADDING CAST COTTON 6X4 STRL (CAST SUPPLIES) ×9 IMPLANT
POSITIONER SURGICAL ARM (MISCELLANEOUS) ×3 IMPLANT
SET HNDPC FAN SPRY TIP SCT (DISPOSABLE) ×2 IMPLANT
STRIP CLOSURE SKIN 1/2X4 (GAUZE/BANDAGES/DRESSINGS) ×6 IMPLANT
SUT MNCRL AB 4-0 PS2 18 (SUTURE) ×3 IMPLANT
SUT STRATAFIX 0 PDS 27 VIOLET (SUTURE) ×3
SUT VIC AB 2-0 CT1 27 (SUTURE) ×3
SUT VIC AB 2-0 CT1 TAPERPNT 27 (SUTURE) ×6 IMPLANT
SUTURE STRATFX 0 PDS 27 VIOLET (SUTURE) ×2 IMPLANT
SYR 30ML LL (SYRINGE) ×6 IMPLANT
TRAY FOLEY CATH SILVER 14FR (SET/KITS/TRAYS/PACK) ×3 IMPLANT
TRAY FOLEY W/METER SILVER 16FR (SET/KITS/TRAYS/PACK) IMPLANT
WRAP KNEE MAXI GEL POST OP (GAUZE/BANDAGES/DRESSINGS) ×3 IMPLANT
YANKAUER SUCT BULB TIP 10FT TU (MISCELLANEOUS) ×3 IMPLANT

## 2017-03-12 NOTE — Discharge Instructions (Addendum)
Dr. Gaynelle Arabian Total Joint Specialist [facility] [address] [place], [place] [address] [phone]  TOTAL KNEE REPLACEMENT POSTOPERATIVE DIRECTIONS  Knee Rehabilitation, Guidelines Following Surgery  Results after knee surgery are often greatly improved when you follow the exercise, range of motion and muscle strengthening exercises prescribed by your doctor. Safety measures are also important to protect the knee from further injury. Any time any of these exercises cause you to have increased pain or swelling in your knee joint, decrease the amount until you are comfortable again and slowly increase them. If you have problems or questions, call your caregiver or physical therapist for advice.   HOME CARE INSTRUCTIONS  Remove items at home which could result in a fall. This includes throw rugs or furniture in walking pathways.   ICE to the affected knee every three hours for 30 minutes at a time and then as needed for pain and swelling.  Continue to use ice on the knee for pain and swelling from surgery. You may notice swelling that will progress down to the foot and ankle.  This is normal after surgery.  Elevate the leg when you are not up walking on it.    Continue to use the breathing machine which will help keep your temperature down.  It is common for your temperature to cycle up and down following surgery, especially at night when you are not up moving around and exerting yourself.  The breathing machine keeps your lungs expanded and your temperature down.  Do not place pillow under knee, focus on keeping the knee straight while resting  DIET You may resume your previous home diet once your are discharged from the hospital.  DRESSING / WOUND CARE / SHOWERING You may shower 3 days after surgery, but keep the wounds dry during showering.  You may use an occlusive plastic wrap (Press'n Seal for example), NO SOAKING/SUBMERGING IN THE BATHTUB.  If the  bandage gets wet, change with a clean dry gauze.  If the incision gets wet, pat the wound dry with a clean towel. You may start showering once you are discharged home but do not submerge the incision under water. Just pat the incision dry and apply a dry gauze dressing on daily. Change the surgical dressing daily and reapply a dry dressing each time.  ACTIVITY Walk with your walker as instructed. Use walker as long as suggested by your caregivers. Avoid periods of inactivity such as sitting longer than an hour when not asleep. This helps prevent blood clots.  You may resume a sexual relationship in one month or when given the OK by your doctor.  You may return to work once you are cleared by your doctor.  Do not drive a car for 6 weeks or until released by you surgeon.  Do not drive while taking narcotics.  WEIGHT BEARING Weight bearing as tolerated with assist device (walker, cane, etc) as directed, use it as long as suggested by your surgeon or therapist, typically at least 4-6 weeks.  POSTOPERATIVE CONSTIPATION PROTOCOL Constipation - defined medically as fewer than three stools per week and severe constipation as less than one stool per week.  One of the most common issues patients have following surgery is constipation.  Even if you have a regular bowel pattern at home, your normal regimen is likely to be disrupted due to multiple reasons following surgery.  Combination of anesthesia, postoperative narcotics, change in appetite and fluid intake all can affect your bowels.  In order to avoid complications following surgery, here are some recommendations in order to help you during your recovery period.  Colace (docusate) - Pick up an over-the-counter form of Colace or another stool softener and take twice a day as long as you are requiring postoperative pain medications.  Take with a full glass of water daily.  If you experience loose stools or diarrhea, hold the colace until you stool forms  back up.  If your symptoms do not get better within 1 week or if they get worse, check with your doctor.  Dulcolax (bisacodyl) - Pick up over-the-counter and take as directed by the product packaging as needed to assist with the movement of your bowels.  Take with a full glass of water.  Use this product as needed if not relieved by Colace only.   MiraLax (polyethylene glycol) - Pick up over-the-counter to have on hand.  MiraLax is a solution that will increase the amount of water in your bowels to assist with bowel movements.  Take as directed and can mix with a glass of water, juice, soda, coffee, or tea.  Take if you go more than two days without a movement. Do not use MiraLax more than once per day. Call your doctor if you are still constipated or irregular after using this medication for 7 days in a row.  If you continue to have problems with postoperative constipation, please contact the office for further assistance and recommendations.  If you experience "the worst abdominal pain ever" or develop nausea or vomiting, please contact the office immediatly for further recommendations for treatment.  ITCHING  If you experience itching with your medications, try taking only a single pain pill, or even half a pain pill at a time.  You can also use Benadryl over the counter for itching or also to help with sleep.   TED HOSE STOCKINGS Wear the elastic stockings on both legs for three weeks following surgery during the day but you may remove then at night for sleeping.  MEDICATIONS See your medication summary on the After Visit Summary that the nursing staff will review with you prior to discharge.  You may have some home medications which will be placed on hold until you complete the course of blood thinner medication.  It is important for you to complete the blood thinner medication as prescribed by your surgeon.  Continue your approved medications as instructed at time of  discharge.  PRECAUTIONS If you experience chest pain or shortness of breath - call 911 immediately for transfer to the hospital emergency department.  If you develop a fever greater that 101 F, purulent drainage from wound, increased redness or drainage from wound, foul odor from the wound/dressing, or calf pain - CONTACT YOUR SURGEON.                                                   FOLLOW-UP APPOINTMENTS Make sure you keep all of your appointments after your operation with your surgeon and caregivers. You should call the office at the above phone number and make an appointment for approximately two weeks after the date of your surgery or on the date instructed by your surgeon outlined in the "After Visit Summary".   RANGE OF MOTION AND STRENGTHENING EXERCISES  Rehabilitation of the knee is important following a knee injury or  an operation. After just a few days of immobilization, the muscles of the thigh which control the knee become weakened and shrink (atrophy). Knee exercises are designed to build up the tone and strength of the thigh muscles and to improve knee motion. Often times heat used for twenty to thirty minutes before working out will loosen up your tissues and help with improving the range of motion but do not use heat for the first two weeks following surgery. These exercises can be done on a training (exercise) mat, on the floor, on a table or on a bed. Use what ever works the best and is most comfortable for you Knee exercises include:  Leg Lifts - While your knee is still immobilized in a splint or cast, you can do straight leg raises. Lift the leg to 60 degrees, hold for 3 sec, and slowly lower the leg. Repeat 10-20 times 2-3 times daily. Perform this exercise against resistance later as your knee gets better.  Quad and Hamstring Sets - Tighten up the muscle on the front of the thigh (Quad) and hold for 5-10 sec. Repeat this 10-20 times hourly. Hamstring sets are done by pushing the  foot backward against an object and holding for 5-10 sec. Repeat as with quad sets.   Leg Slides: Lying on your back, slowly slide your foot toward your buttocks, bending your knee up off the floor (only go as far as is comfortable). Then slowly slide your foot back down until your leg is flat on the floor again.  Angel Wings: Lying on your back spread your legs to the side as far apart as you can without causing discomfort.  A rehabilitation program following serious knee injuries can speed recovery and prevent re-injury in the future due to weakened muscles. Contact your doctor or a physical therapist for more information on knee rehabilitation.   IF YOU ARE TRANSFERRED TO A SKILLED REHAB FACILITY If the patient is transferred to a skilled rehab facility following release from the hospital, a list of the current medications will be sent to the facility for the patient to continue.  When discharged from the skilled rehab facility, please have the facility set up the patient's [place] prior to being released. Also, the skilled facility will be responsible for providing the patient with their medications at time of release from the facility to include their pain medication, the muscle relaxants, and their blood thinner medication. If the patient is still at the rehab facility at time of the two week follow up appointment, the skilled rehab facility will also need to assist the patient in arranging follow up appointment in our office and any transportation needs.  MAKE SURE YOU:  Understand these instructions.  Get help right away if you are not doing well or get worse.    Pick up stool softner and laxative for home use following surgery while on pain medications. Do not submerge incision under water. Please use good hand washing techniques while changing dressing each day. May shower starting three days after surgery. Please use a clean towel to pat the incision dry following  showers. Continue to use ice for pain and swelling after surgery. Do not use any lotions or creams on the incision until instructed by your surgeon.  Take Xarelto for two and a half more weeks following discharge from the hospital, then discontinue Xarelto. Once the patient has completed the Xarelto, they may resume the 81 mg Aspirin.    Information on  my medicine - XARELTO (Rivaroxaban)  Why was Xarelto prescribed for you? Xarelto was prescribed for you to reduce the risk of blood clots forming after orthopedic surgery. The medical term for these abnormal blood clots is venous thromboembolism (VTE).  What do you need to know about xarelto ? Take your Xarelto ONCE DAILY at the same time every day. You may take it either with or without food.  If you have difficulty swallowing the tablet whole, you may crush it and mix in applesauce just prior to taking your dose.  Take Xarelto exactly as prescribed by your doctor and DO NOT stop taking Xarelto without talking to the doctor who prescribed the medication.  Stopping without other VTE prevention medication to take the place of Xarelto may increase your risk of developing a clot.  After discharge, you should have regular check-up appointments with your healthcare provider that is prescribing your Xarelto.    What do you do if you miss a dose? If you miss a dose, take it as soon as you remember on the same day then continue your regularly scheduled once daily regimen the next day. Do not take two doses of Xarelto on the same day.   Important Safety Information A possible side effect of Xarelto is bleeding. You should call your healthcare provider right away if you experience any of the following: ? Bleeding from an injury or your nose that does not stop. ? Unusual colored urine (red or dark brown) or unusual colored stools (red or black). ? Unusual bruising for unknown reasons. ? A serious fall or if you hit your head (even if  there is no bleeding).  Some medicines may interact with Xarelto and might increase your risk of bleeding while on Xarelto. To help avoid this, consult your healthcare provider or pharmacist prior to using any new prescription or non-prescription medications, including herbals, vitamins, non-steroidal anti-inflammatory drugs (NSAIDs) and supplements.  This website has more information on Xarelto: [email or web address]

## 2017-03-12 NOTE — Transfer of Care (Signed)
Immediate Anesthesia Transfer of Care Note  Patient: Cole Martinez  Procedure(s) Performed: RIGHT TOTAL KNEE ARTHROPLASTY (Right Knee) CYSTOSCOPY WITH URETHRAL DILATATION  Patient Location: PACU  Anesthesia Type:General  Level of Consciousness: awake, alert , oriented and patient cooperative  Airway & Oxygen Therapy: Patient Spontanous Breathing and Patient connected to face mask oxygen  Post-op Assessment: Report given to RN, Post -op Vital signs reviewed and stable and Patient moving all extremities X 4  Post vital signs: stable  Last Vitals:  Vitals:   03/12/17 0559  BP: (!) 151/92  Pulse: 75  Resp: 18  Temp: 36.4 C  SpO2: 99%    Last Pain:  Vitals:   03/12/17 0559  TempSrc: Oral      Patients Stated Pain Goal: 4 (32/44/01 0272)  Complications: No apparent anesthesia complications

## 2017-03-12 NOTE — Anesthesia Preprocedure Evaluation (Addendum)
Anesthesia Evaluation  Patient identified by MRN, date of birth, ID band Patient awake    Reviewed: Allergy & Precautions, H&P , NPO status , Patient's Chart, lab work & pertinent test results  Airway Mallampati: III  TM Distance: >3 FB Neck ROM: Full    Dental  (+) Teeth Intact, Caps   Pulmonary former smoker,    Pulmonary exam normal breath sounds clear to auscultation       Cardiovascular hypertension, + CAD and + Cardiac Stents  Normal cardiovascular exam Rhythm:Regular  ECG: NSR, rate 85  Nuclear stress EF: 57%. Blood pressure demonstrated a normal response to exercise.  There was no ST segment deviation noted during stress. The study is normal. This is a low risk study. The left ventricular ejection fraction is normal (55-65%).  Normal resting and stress perfusion. No ischemia or infarction EF 57%  Sees cardiologist Tamala Julian)   Neuro/Psych ADHD Tremor, RIGHT HAND     GI/Hepatic Neg liver ROS, Crohns disease   Endo/Other  negative endocrine ROS  Renal/GU negative Renal ROS     Musculoskeletal  (+) Arthritis ,   Abdominal   Peds  Hematology Hx of CLL HLD   Anesthesia Other Findings Multiple caps  Reproductive/Obstetrics                            Anesthesia Physical  Anesthesia Plan  ASA: III  Anesthesia Plan: Spinal and Regional   Post-op Pain Management:  Regional for Post-op pain   Induction: Intravenous  PONV Risk Score and Plan: 1 and Propofol infusion, Treatment may vary due to age or medical condition and Ondansetron  Airway Management Planned: Natural Airway  Additional Equipment:   Intra-op Plan:   Post-operative Plan:   Informed Consent: I have reviewed the patients History and Physical, chart, labs and discussed the procedure including the risks, benefits and alternatives for the proposed anesthesia with the patient or authorized representative who has  indicated his/her understanding and acceptance.   Dental advisory given  Plan Discussed with: CRNA  Anesthesia Plan Comments:        Anesthesia Quick Evaluation

## 2017-03-12 NOTE — Progress Notes (Signed)
Patient ID: Cole Martinez, male   DOB: 25-Apr-1945, 72 y.o.   MRN: [id number]  Day of Surgery Subjective: Pt required urethral dilation for bulbar stricture for catheter placement today in the OR.  He is s/p TUNA and TURP by Dr. Gaynelle Arabian previously.  He has had voiding symptoms of nocturia and frequency that have been somewhat worse recently.  Objective: Vital signs in last 24 hours: Temp:  [97.6 F (36.4 C)-98.4 F (36.9 C)] 98 F (36.7 C) (01/07 1757) Pulse Rate:  [75-85] 79 (01/07 1757) Resp:  [14-24] 16 (01/07 1757) BP: (111-151)/(67-92) 127/74 (01/07 1757) SpO2:  [96 %-100 %] 100 % (01/07 1757) Weight:  [88.5 kg (195 lb)] 88.5 kg (195 lb) (01/07 2493)  Intake/Output from previous day: No intake/output data recorded. 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  Will arrange outpatient follow up in 1 week for a voiding trial.   LOS: 0 days   Aara Jacquot,LES 03/12/2017, 6:55 PM

## 2017-03-12 NOTE — Op Note (Signed)
OPERATIVE REPORT-TOTAL KNEE ARTHROPLASTY   Pre-operative diagnosis- Osteoarthritis  Right knee(s)  Post-operative diagnosis- Osteoarthritis Right knee(s)  Procedure-  Right  Total Knee Arthroplasty (Depuy Attune)  Surgeon- Dione Plover. Jerene Yeager, MD  Assistant- Ardeen Jourdain, PA-C   Anesthesia-  GA combined with regional for post-op pain  EBL-50 mL   Drains Hemovac  Tourniquet time- 30 minutes @ 295 mm Hg  Complications- None  Condition-PACU - hemodynamically stable.   Brief Clinical Note  Cole Martinez is a 72 y.o. year old male with end stage OA of his right knee with progressively worsening pain and dysfunction. He has constant pain, with activity and at rest and significant functional deficits with difficulties even with ADLs. He has had extensive non-op management including analgesics, injections of cortisone, and home exercise program, but remains in significant pain with significant dysfunction. Radiographs show bone on bone arthritis medial and patellofemoral. He presents now for right Total Knee Arthroplasty.    Procedure in detail---   The patient is brought into the operating room and positioned supine on the operating table. After successful administration of  GA combined with regional for post-op pain,   a tourniquet is placed high on the  Right thigh(s) and the lower extremity is prepped and draped in the usual sterile fashion. Time out is performed by the operating team and then the  Right lower extremity is wrapped in Esmarch, knee flexed and the tourniquet inflated to 300 mmHg.       A midline incision is made with a ten blade through the subcutaneous tissue to the level of the extensor mechanism. A fresh blade is used to make a medial parapatellar arthrotomy. Soft tissue over the proximal medial tibia is subperiosteally elevated to the joint line with a knife and into the semimembranosus bursa with a Cobb elevator. Soft tissue over the proximal lateral tibia is  elevated with attention being paid to avoiding the patellar tendon on the tibial tubercle. The patella is everted, knee flexed 90 degrees and the ACL and PCL are removed. Findings are bone on bone medial and patellofemoral with massive global ostoephytes.        The drill is used to create a starting hole in the distal femur and the canal is thoroughly irrigated with sterile saline to remove the fatty contents. The 5 degree Right  valgus alignment guide is placed into the femoral canal and the distal femoral cutting block is pinned to remove 9 mm off the distal femur. Resection is made with an oscillating saw.      The tibia is subluxed forward and the menisci are removed. The extramedullary alignment guide is placed referencing proximally at the medial aspect of the tibial tubercle and distally along the second metatarsal axis and tibial crest. The block is pinned to remove 64m off the more deficient medial  side. Resection is made with an oscillating saw. Size 8is the most appropriate size for the tibia and the proximal tibia is prepared with the modular drill and keel punch for that size.      The femoral sizing guide is placed and size 8 is most appropriate. Rotation is marked off the epicondylar axis and confirmed by creating a rectangular flexion gap at 90 degrees. The size 8 cutting block is pinned in this rotation and the anterior, posterior and chamfer cuts are made with the oscillating saw. The intercondylar block is then placed and that cut is made.      Trial size 8 tibial component,  trial size 8 posterior stabilized femur and a 6  mm posterior stabilized rotating platform insert trial is placed. Full extension is achieved with excellent varus/valgus and anterior/posterior balance throughout full range of motion. The patella is everted and thickness measured to be 27  mm. Free hand resection is taken to 15 mm, a 41 template is placed, lug holes are drilled, trial patella is placed, and it tracks  normally. Osteophytes are removed off the posterior femur with the trial in place. All trials are removed and the cut bone surfaces prepared with pulsatile lavage. Cement is mixed and once ready for implantation, the size 8 tibial implant, size  8 posterior stabilized femoral component, and the size 41 patella are cemented in place and the patella is held with the clamp. The trial insert is placed and the knee held in full extension. The Exparel (20 ml mixed with 60 ml saline) is injected into the extensor mechanism, posterior capsule, medial and lateral gutters and subcutaneous tissues.  All extruded cement is removed and once the cement is hard the permanent 6 mm posterior stabilized rotating platform insert is placed into the tibial tray.      The wound is copiously irrigated with saline solution and the extensor mechanism closed over a hemovac drain with #1 V-loc suture. The tourniquet is released for a total tourniquet time of 30  minutes. Flexion against gravity is 140 degrees and the patella tracks normally. Subcutaneous tissue is closed with 2.0 vicryl and subcuticular with running 4.0 Monocryl. The incision is cleaned and dried and steri-strips and a bulky sterile dressing are applied. The limb is placed into a knee immobilizer and the patient is awakened and transported to recovery in stable condition.      Please note that a surgical assistant was a medical necessity for this procedure in order to perform it in a safe and expeditious manner. Surgical assistant was necessary to retract the ligaments and vital neurovascular structures to prevent injury to them and also necessary for proper positioning of the limb to allow for anatomic placement of the prosthesis.   Dione Plover Arda Daggs, MD    03/12/2017, 8:40 AM

## 2017-03-12 NOTE — Op Note (Signed)
Foley catheter procedure note  Indications: Patient is a 72 year old male status post TURP in 2015 with Dr. Gaynelle Arabian who is undergoing total knee arthroplasty with Dr. Wynelle Link.  Foley catheter was attempted to be placed by nursing staff prior to the procedure, howewver resistance was met.  Urology was consulted for assistance.  Pre-procedure diagnosis: BPH  Post-procedure diagnosis: BPH, urethral stricture  Surgeon: Jonna Clark, MD  Assistants: None  Procedure Details:  Upon entering the operating room, patient had been placed in supine position, prepped with Betadine.  He was reprepped and draped in the usual sterile fashion.  Lubricating jelly was inserted per urethra prior to the procedure. I then inserted a 18 [place] coude catheter per urethra.  Resistance was met in the bulbar urethra, it was removed.  I inserted a flexible cystoscope per urethra and performed urethroscopy.  Urethra appeared normal, until the bulbar urethra where an approximately 12 French thin  urethral stricture was noted.  I was able to visualize passage beyond the stricture into the bladder, however was unable to pass my scope gently through it. A mild false passage was noted in the posterior urethra just distal to the stricture.  I advanced a sensor wire under direct visualization through the true urethral lumen into the bladder, and removed the cystoscope.  Haaman Bard dilators were used to dilate the stricture up to 20 [place] over our sensor wire.  A 16 French [place] tip Foley catheter was then advanced over the sensor wire into the bladder, which passed without resistance met at the prostatic urethra. I achieved return of clear urine and the proceeded to insert 10 ml of sterile water into the foley balloon. The catheter was attached to a drainage bag and secured with a statlock.   The patient was then turned over to the orthopedic surgery team.   Complication: None, patient tolerated the procedure well    Plan:  1. Continue foley catheter to drainage for around 7 days.  2. Will schedule follow up for catheter removal and TOV with Dr. Alinda Money.  3. Perioperative antibiotics should suffice for procedure, no additional antibiotics needed from urologic standpoint.   Attending Attestation: Dr. Alinda Money was available.   ---- Jonna Clark, MD Urologic Surgery Resident

## 2017-03-12 NOTE — Anesthesia Procedure Notes (Signed)
Procedure Name: Intubation Date/Time: 03/12/2017 7:28 AM Performed by: Lissa Morales, CRNA Pre-anesthesia Checklist: Patient identified, Emergency Drugs available, Suction available and Patient being monitored Patient Re-evaluated:Patient Re-evaluated prior to induction Oxygen Delivery Method: Circle system utilized Preoxygenation: Pre-oxygenation with 100% oxygen Induction Type: IV induction Ventilation: Mask ventilation without difficulty Laryngoscope Size: Mac and 4 Grade View: Grade II Tube type: Oral Tube size: 7.5 mm Number of attempts: 1 Airway Equipment and Method: Stylet and Oral airway Placement Confirmation: ETT inserted through vocal cords under direct vision,  positive ETCO2 and breath sounds checked- equal and bilateral Secured at: 22 cm Tube secured with: Tape Dental Injury: Teeth and Oropharynx as per pre-operative assessment

## 2017-03-12 NOTE — Anesthesia Postprocedure Evaluation (Signed)
Anesthesia Post Note  Patient: Cole Martinez  Procedure(s) Performed: RIGHT TOTAL KNEE ARTHROPLASTY (Right Knee) CYSTOSCOPY WITH URETHRAL DILATATION     Patient location during evaluation: PACU Anesthesia Type: Regional and General Level of consciousness: awake and alert Pain management: pain level controlled Vital Signs Assessment: post-procedure vital signs reviewed and stable Respiratory status: spontaneous breathing, nonlabored ventilation, respiratory function stable and patient connected to nasal cannula oxygen Cardiovascular status: blood pressure returned to baseline and stable Postop Assessment: no apparent nausea or vomiting Anesthetic complications: no    Last Vitals:  Vitals:   03/12/17 1243 03/12/17 1343  BP: 125/76 112/67  Pulse: 85 80  Resp: 16 16  Temp:    SpO2: 98% 98%    Last Pain:  Vitals:   03/12/17 1143  TempSrc: Oral  PainSc:                  Jhaniya Briski P Rekisha Welling

## 2017-03-12 NOTE — Anesthesia Procedure Notes (Signed)
Anesthesia Regional Block: Adductor canal block   Pre-Anesthetic Checklist: ,, timeout performed, Correct Patient, Correct Site, Correct Laterality, Correct Procedure,, site marked, risks and benefits discussed, Surgical consent,  Pre-op evaluation,  At surgeon's request and post-op pain management  Laterality: Right  Prep: chloraprep       Needles:  Injection technique: Single-shot  Needle Type: Echogenic Stimulator Needle     Needle Length: 9cm  Needle Gauge: 21     Additional Needles:   Procedures:,,,, ultrasound used (permanent image in chart),,,,  Narrative:  Start time: 03/12/2017 6:55 AM End time: 03/12/2017 7:05 AM Injection made incrementally with aspirations every 5 mL.  Performed by: Personally  Anesthesiologist: Murvin Natal, MD  Additional Notes: Functioning IV was confirmed and monitors were applied.  A 61m 21ga Arrow echogenic stimulator needle was used. Sterile prep, hand hygiene and sterile gloves were used.  Negative aspiration and negative test dose prior to incremental administration of local anesthetic. The patient tolerated the procedure well.

## 2017-03-12 NOTE — Evaluation (Signed)
Physical Therapy Evaluation Patient Details Name: Cole Martinez MRN: [id number] DOB: 1945-08-10 Today's Date: 03/12/2017   History of Present Illness  s/p  R TKA, catheter placed by urology;  PMHx: TURP, hernia repair  Clinical Impression  Pt is s/p TKA resulting in the deficits listed below (see PT Problem List). * Pt will benefit from skilled PT to increase their independence and safety with mobility to allow discharge to the venue listed below.  Pt with decr balance, incr knee pain with WBing, LOB x 1 with min assist to recover; pt wife reports he had a fall in a parking lot approximately 6 wks ago; will continue to follow in acute setting; may need 24hr supervision/assist initially     Follow Up Recommendations DC plan and follow up therapy as arranged by surgeon    Equipment Recommendations  None recommended by PT    Recommendations for Other Services       Precautions / Restrictions Precautions Precautions: Fall;Knee Required Braces or Orthoses: Knee Immobilizer - Right Knee Immobilizer - Right: Discontinue once straight leg raise with < 10 degree lag Restrictions Weight Bearing Restrictions: No Other Position/Activity Restrictions: WBAT      Mobility  Bed Mobility Overal bed mobility: Needs Assistance Bed Mobility: Supine to Sit     Supine to sit: Min assist     General bed mobility comments: assist with trunk and RLE  Transfers Overall transfer level: Needs assistance Equipment used: Rolling walker (2 wheeled) Transfers: Sit to/from Stand Sit to Stand: Min assist         General transfer comment: assist to rise and stabilize, initial posterior LOB; cues for hand placement  Ambulation/Gait Ambulation/Gait assistance: Min assist Ambulation Distance (Feet): 40 Feet Assistive device: Rolling walker (2 wheeled) Gait Pattern/deviations: Step-to pattern;Trunk flexed     General Gait Details: unsteady gait, assist for balance, cues RW position, step length,  posture  Stairs            Wheelchair Mobility    Modified Rankin (Stroke Patients Only)       Balance Overall balance assessment: Needs assistance           Standing balance-Leahy Scale: Poor Standing balance comment: reliant on UEs, posterior LOB in static stand                             Pertinent Vitals/Pain Pain Assessment: 0-10 Pain Score: 0-No pain Pain Location: right knee Pain Descriptors / Indicators: Discomfort;Sore Pain Intervention(s): Limited activity within patient's tolerance;Monitored during session;Premedicated before session;Repositioned;Ice applied    Home Living Family/patient expects to be discharged to:: Private residence Living Arrangements: Spouse/significant other Available Help at Discharge: Family Type of Home: House Home Access: Stairs to enter   [employer] of Steps: 2-3 Home Layout: Two level;Bed/bath upstairs;Able to live on main level with bedroom/bathroom;1/2 bath on main level Home Equipment: Walker - 2 wheels;Bedside commode Additional Comments: has a bed downstairs if needed     Prior Function Level of Independence: Independent               Hand Dominance        Extremity/Trunk Assessment   Upper Extremity Assessment Upper Extremity Assessment: Defer to OT evaluation    Lower Extremity Assessment Lower Extremity Assessment: RLE deficits/detail RLE Deficits / Details: ankle WFL; knee extension with hip flexion 2+/5, limited by post op pain    Cervical / Trunk Assessment Cervical / Trunk Assessment: Kyphotic  Communication   Communication: No difficulties  Cognition Arousal/Alertness: Awake/alert Behavior During Therapy: WFL for tasks assessed/performed Overall Cognitive Status: Within Functional Limits for tasks assessed                                        General Comments      Exercises Total Joint Exercises Ankle Circles/Pumps: AROM;Both;10 reps Quad  Sets: Both;AROM;5 reps   Assessment/Plan    PT Assessment Patient needs continued PT services  PT Problem List Decreased strength;Decreased range of motion;Decreased activity tolerance;Decreased mobility;Decreased balance;Pain       PT Treatment Interventions DME instruction;Gait training;Functional mobility training;Therapeutic activities;Therapeutic exercise;Patient/family education;Stair training    PT Goals (Current goals can be found in the Care Plan section)  Acute Rehab PT Goals Patient Stated Goal: be able to get upstairs to bedroom, less knee pain PT Goal Formulation: With patient Potential to Achieve Goals: Good    Frequency 7X/week   Barriers to discharge        Co-evaluation               AM-PAC PT "6 Clicks" Daily Activity  Outcome Measure Difficulty turning over in bed (including adjusting bedclothes, sheets and blankets)?: Unable Difficulty moving from lying on back to sitting on the side of the bed? : Unable Difficulty sitting down on and standing up from a chair with arms (e.g., wheelchair, bedside commode, etc,.)?: Unable Help needed moving to and from a bed to chair (including a wheelchair)?: A Little Help needed walking in hospital room?: A Little Help needed climbing 3-5 steps with a railing? : A Lot 6 Click Score: 11    End of Session Equipment Utilized During Treatment: Gait belt Activity Tolerance: Patient tolerated treatment well Patient left: in chair;with call bell/phone within reach;with chair alarm set Nurse Communication: Mobility status PT Visit Diagnosis: Difficulty in walking, not elsewhere classified (R26.2)    Time: 3545-6256 PT Time Calculation (min) (ACUTE ONLY): 45 min   Charges:   PT Evaluation $PT Eval Low Complexity: 1 Low PT Treatments $Gait Training: 8-22 mins $Therapeutic Activity: 8-22 mins   PT G CodesKenyon Ana, PT Pager: [phone] 03/12/2017   Kenyon Ana 03/12/2017, 5:03 PM

## 2017-03-13 ENCOUNTER — Encounter (HOSPITAL_COMMUNITY): Payer: Self-pay | Admitting: Orthopedic Surgery

## 2017-03-13 LAB — BASIC METABOLIC PANEL
Anion gap: 4 — ABNORMAL LOW (ref 5–15)
BUN: 17 mg/dL (ref 6–20)
CO2: 27 mmol/L (ref 22–32)
Calcium: 8.3 mg/dL — ABNORMAL LOW (ref 8.9–10.3)
Chloride: 107 mmol/L (ref 101–111)
Creatinine, Ser: 0.9 mg/dL (ref 0.61–1.24)
GFR calc Af Amer: >60 mL/min (ref >60)
GFR calc non Af Amer: >60 mL/min (ref >60)
Glucose, Bld: 121 mg/dL — ABNORMAL HIGH (ref 65–99)
Potassium: 4.4 mmol/L (ref 3.5–5.1)
Sodium: 138 mmol/L (ref 135–145)

## 2017-03-13 LAB — CBC
HCT: 40.5 % (ref 39.0–52.0)
Hemoglobin: 13.3 g/dL (ref 13.0–17.0)
MCH: 29.6 pg (ref 26.0–34.0)
MCHC: 32.8 g/dL (ref 30.0–36.0)
MCV: 90.2 fL (ref 78.0–100.0)
Platelets: 235 10*3/uL (ref 150–400)
RBC: 4.49 MIL/uL (ref 4.22–5.81)
RDW: 13.8 % (ref 11.5–15.5)
WBC: 15.5 10*3/uL — ABNORMAL HIGH (ref 4.0–10.5)

## 2017-03-13 MED ORDER — TRAMADOL HCL 50 MG PO TABS: 50.0000 mg | ORAL_TABLET | Freq: Four times a day (QID) | ORAL | 0 refills | Status: DC | PRN

## 2017-03-13 MED ORDER — OXYCODONE HCL 5 MG PO TABS: 5.0000 mg | ORAL_TABLET | ORAL | 0 refills | Status: DC | PRN

## 2017-03-13 MED ORDER — METHOCARBAMOL 500 MG PO TABS: 500.0000 mg | ORAL_TABLET | Freq: Four times a day (QID) | ORAL | 0 refills | Status: DC | PRN

## 2017-03-13 MED ORDER — RIVAROXABAN 10 MG PO TABS: 10.0000 mg | ORAL_TABLET | Freq: Every day | ORAL | 0 refills | Status: DC

## 2017-03-13 NOTE — Progress Notes (Signed)
Physical Therapy Treatment Patient Details Name: Cole Martinez MRN: [id number] DOB: Jan 14, 1946 Today's Date: 03/13/2017    History of Present Illness s/p  R TKA, catheter placed by urology;  PMHx: TURP, hernia repair    PT Comments    Pt is progressing well; reports pain improved after therapy, provided with and educated on importance of ice; pt would like to practice stairs today, will assess needs in pm  Follow Up Recommendations  DC plan and follow up therapy as arranged by surgeon     Equipment Recommendations  None recommended by PT    Recommendations for Other Services       Precautions / Restrictions Precautions Precautions: Fall;Knee Precaution Comments: reviewed precautions, pt and wife verbalize Required Braces or Orthoses: Knee Immobilizer - Right Knee Immobilizer - Right: Discontinue once straight leg raise with < 10 degree lag Restrictions Weight Bearing Restrictions: No LLE Weight Bearing: Weight bearing as tolerated Other Position/Activity Restrictions: WBAT    Mobility  Bed Mobility Overal bed mobility: Needs Assistance Bed Mobility: Supine to Sit     Supine to sit: Supervision     General bed mobility comments: pt OOB in recliner  Transfers Overall transfer level: Needs assistance Equipment used: Rolling walker (2 wheeled) Transfers: Sit to/from Stand Sit to Stand: Supervision;Min guard Stand pivot transfers: Min assist       General transfer comment: VC's for hand placement  Ambulation/Gait Ambulation/Gait assistance: Min guard Ambulation Distance (Feet): 120 Feet Assistive device: Rolling walker (2 wheeled) Gait Pattern/deviations: Step-to pattern;Trunk flexed     General Gait Details: cues RW position- especially with turns , step length, posture   Stairs Stairs: (verbally reviewed posterior up technique)          Wheelchair Mobility    Modified Rankin (Stroke Patients Only)       Balance Overall balance assessment:  Needs assistance Sitting-balance support: No upper extremity supported;Feet supported Sitting balance-Leahy Scale: Fair     Standing balance support: Bilateral upper extremity supported Standing balance-Leahy Scale: Fair Standing balance comment: Did not attempt standing w/o UE support this date                            Cognition Arousal/Alertness: Awake/alert Behavior During Therapy: WFL for tasks assessed/performed Overall Cognitive Status: Within Functional Limits for tasks assessed                                        Exercises Total Joint Exercises Ankle Circles/Pumps: AROM;Both;10 reps Quad Sets: AROM;Both;10 reps Heel Slides: AAROM;AROM;Right;10 reps Hip ABduction/ADduction: AROM;Both;10 reps Straight Leg Raises: AAROM;Right;10 reps    General Comments General comments (skin integrity, edema, etc.): Pt ambulates with shuffling gait, wide base of stance with feet turned outward most likely to compensate for balance issues. Pt wife states he has been doing this "For last few years". Able to correct somewhat with vc's and use of RW       Pertinent Vitals/Pain Pain Assessment: 0-10 Pain Score: 5  Pain Location: right knee Pain Descriptors / Indicators: Discomfort;Sore Pain Intervention(s): Limited activity within patient's tolerance;Monitored during session;Ice applied;Premedicated before session    Home Living Family/patient expects to be discharged to:: Private residence Living Arrangements: Spouse/significant other Available Help at Discharge: Family Type of Home: House Home Access: Stairs to enter Entrance Stairs-Rails: None Home Layout: Two level;Bed/bath upstairs;Able to live on  main level with bedroom/bathroom;1/2 bath on main level Home Equipment: Walker - 2 wheels;Bedside commode Additional Comments: has a bed & 1/2 bath downstairs if needed     Prior Function Level of Independence: Independent          PT Goals (current  goals can now be found in the care plan section) Acute Rehab PT Goals Patient Stated Goal: To be able to get upstairs to main bed/bathroom PT Goal Formulation: With patient Potential to Achieve Goals: Good Progress towards PT goals: Progressing toward goals    Frequency    7X/week      PT Plan Current plan remains appropriate    Co-evaluation              AM-PAC PT "6 Clicks" Daily Activity  Outcome Measure  Difficulty turning over in bed (including adjusting bedclothes, sheets and blankets)?: A Little Difficulty moving from lying on back to sitting on the side of the bed? : A Little Difficulty sitting down on and standing up from a chair with arms (e.g., wheelchair, bedside commode, etc,.)?: Unable Help needed moving to and from a bed to chair (including a wheelchair)?: A Little Help needed walking in hospital room?: A Little Help needed climbing 3-5 steps with a railing? : A Little 6 Click Score: 16    End of Session Equipment Utilized During Treatment: Gait belt Activity Tolerance: Patient tolerated treatment well Patient left: in chair;with call bell/phone within reach;with chair alarm set Nurse Communication: Mobility status PT Visit Diagnosis: Difficulty in walking, not elsewhere classified (R26.2)     Time: 1131-1200 PT Time Calculation (min) (ACUTE ONLY): 29 min  Charges:  $Gait Training: 8-22 mins $Therapeutic Exercise: 8-22 mins                    G CodesKenyon Ana, PT Pager: [phone] 03/13/2017    Kenyon Ana 03/13/2017, 12:08 PM

## 2017-03-13 NOTE — Progress Notes (Addendum)
   03/13/17 1400  PT Visit Information  Last PT Received On 03/13/17  Assistance Needed +1  History of Present Illness s/p  R TKA, catheter placed by urology;  PMHx: TURP, hernia repair  Subjective Data  Subjective stairs deferred d/t pain level; pt is concerned about sleeping in "roll-away" bed at home as he states that the bed is only 11' high; discussed placing extra mattress on top if this is an option (pt will not be able to stand from 11' high surface)  Patient Stated Goal To be able to get upstairs to main bed/bathroom  Precautions  Precautions Fall;Knee  Precaution Comments reviewed precautions, pt and wife verbalize  Knee Immobilizer - Right Discontinue once straight leg raise with < 10 degree lag  Restrictions  Other Position/Activity Restrictions WBAT  Pain Assessment  Pain Assessment 0-10  Pain Score 4  Pain Location right knee  Pain Descriptors / Indicators Discomfort;Sore  Pain Intervention(s) Limited activity within patient's tolerance;Monitored during session  Cognition  Arousal/Alertness Awake/alert  Behavior During Therapy [facility] for tasks assessed/performed  Overall Cognitive Status Within Functional Limits for tasks assessed  Bed Mobility  Overal bed mobility Needs Assistance  Bed Mobility Supine to Sit;Sit to Supine  Supine to sit Min assist  Sit to supine Min assist  General bed mobility comments cues for technique and to self assist  Transfers  Overall transfer level Needs assistance  Equipment used Rolling walker (2 wheeled)  Transfers Sit to/from Stand  Sit to Stand Supervision;Min guard  General transfer comment VC's for hand placement  Ambulation/Gait  Ambulation/Gait assistance Min guard;Min assist  Ambulation Distance (Feet) 60 Feet  Assistive device Rolling walker (2 wheeled)  Gait Pattern/deviations Step-to pattern;Trunk flexed  General Gait Details cues for RW safety, use of UEs for pain control   PT - End of Session  Equipment Utilized During  Treatment Gait belt;Right knee immobilizer  Activity Tolerance Patient tolerated treatment well  Patient left in bed;with call bell/phone within reach;with bed alarm set;with family/visitor present  PT - Assessment/Plan  PT Plan Current plan remains appropriate  PT Visit Diagnosis Difficulty in walking, not elsewhere classified (R26.2)  PT Frequency (ACUTE ONLY) 7X/week  Follow Up Recommendations DC plan and follow up therapy as arranged by surgeon  PT equipment None recommended by PT  AM-PAC PT "6 Clicks" Daily Activity Outcome Measure  Difficulty turning over in bed (including adjusting bedclothes, sheets and blankets)? 1  Difficulty moving from lying on back to sitting on the side of the bed?  1  Difficulty sitting down on and standing up from a chair with arms (e.g., wheelchair, bedside commode, etc,.)? 1  Help needed moving to and from a bed to chair (including a wheelchair)? 3  Help needed walking in hospital room? 3  Help needed climbing 3-5 steps with a railing?  3  6 Click Score 12  Mobility G Code  CL  PT Goal Progression  Progress towards PT goals Progressing toward goals  Acute Rehab PT Goals  PT Goal Formulation With patient  Potential to Achieve Goals Good  PT Time Calculation  PT Start Time (ACUTE ONLY) 1407  PT Stop Time (ACUTE ONLY) 1434  PT Time Calculation (min) (ACUTE ONLY) 27 min  PT General Charges  $$ ACUTE PT VISIT 1 Visit  PT Treatments  $Gait Training 23-37 mins

## 2017-03-13 NOTE — Discharge Summary (Signed)
Physician Discharge Summary   Patient ID: Cole Martinez MRN: [id number] DOB/AGE: 07-27-1945 72 y.o.  Admit date: 03/12/2017 Discharge date: 03-14-2017  Primary Diagnosis:  Osteoarthritis Right knee(s) BPH    Admission Diagnoses:  Past Medical History:  Diagnosis Date  . ADHD (attention deficit hyperactivity disorder)   . B12 deficiency   . BPH (benign prostatic hypertrophy)   . 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 Coronary artery disease CARDIOLOGIST- DR Daneen Schick   S/P STENTING LAD 1999  . Crohn's disease of ileum ([place]) SINCE 1988  . ED (erectile dysfunction)   . Elevated PSA   . Essential and other specified forms of tremor 11/25/2012  . H/O adenomatous polyp of colon   . Hyperlipidemia   . Nocturia   . OA (osteoarthritis)   . Peripheral neuropathy    hx of, none current as of 08-04-13  . Peyronie disease   . S/P coronary artery stent placement OCT 1999 OF LAD  . Tremor, hereditary, benign MILD RIGHT HAND   Discharge Diagnoses:   Principal Problem:   OA (osteoarthritis) of knee  Estimated body mass index is 30.09 kg/m as calculated from the following:   Height as of this encounter: 5' 7.5" (1.715 m).   Weight as of this encounter: 88.5 kg (195 lb).  Procedure:  Procedure(s) (LRB): RIGHT TOTAL KNEE ARTHROPLASTY (Right) CYSTOSCOPY WITH URETHRAL DILATATION   Consults: urology  HPI: Cole Martinez is a 72 y.o. year old male with end stage OA of his right knee with progressively worsening pain and dysfunction. He has constant pain, with activity and at rest and significant functional deficits with difficulties even with ADLs. He has had extensive non-op management including analgesics, injections of cortisone, and home exercise program, but remains in significant pain with significant dysfunction. Radiographs show bone on bone arthritis medial and  patellofemoral. He presents now for right Total Knee Arthroplasty.     Laboratory Data: Admission on 03/12/2017  Component Date Value Ref Range Status  . WBC 03/13/2017 15.5* 4.0 - 10.5 K/uL Final  . RBC 03/13/2017 4.49  4.22 - 5.81 MIL/uL Final  . Hemoglobin 03/13/2017 13.3  13.0 - 17.0 g/dL Final  . HCT 03/13/2017 40.5  39.0 - 52.0 % Final  . MCV 03/13/2017 90.2  78.0 - 100.0 fL Final  . MCH 03/13/2017 29.6  26.0 - 34.0 pg Final  . MCHC 03/13/2017 32.8  30.0 - 36.0 g/dL Final  . RDW 03/13/2017 13.8  11.5 - 15.5 % Final  . Platelets 03/13/2017 235  150 - 400 K/uL Final  . Sodium 03/13/2017 138  135 - 145 mmol/L Final  . Potassium 03/13/2017 4.4  3.5 - 5.1 mmol/L Final  . Chloride 03/13/2017 107  101 - 111 mmol/L Final  . CO2 03/13/2017 27  22 - 32 mmol/L Final  . Glucose, Bld 03/13/2017 121* 65 - 99 mg/dL Final  . BUN 03/13/2017 17  6 - 20 mg/dL Final  . Creatinine, Ser 03/13/2017 0.90  0.61 - 1.24 mg/dL Final  . Calcium 03/13/2017 8.3* 8.9 - 10.3 mg/dL Final  . GFR calc non Af Amer 03/13/2017 >60  >60 mL/min Final  . GFR calc Af Amer 03/13/2017 >60  >60 mL/min Final   Comment: (NOTE) The eGFR has been calculated using the CKD EPI equation. This calculation has not been validated in all clinical situations. eGFR's persistently <60 mL/min  signify possible Chronic Kidney Disease.   . Anion gap 03/13/2017 4* 5 - 15 Final  . WBC 03/14/2017 15.2* 4.0 - 10.5 K/uL Final  . RBC 03/14/2017 4.03* 4.22 - 5.81 MIL/uL Final  . Hemoglobin 03/14/2017 12.2* 13.0 - 17.0 g/dL Final  . HCT 03/14/2017 36.2* 39.0 - 52.0 % Final  . MCV 03/14/2017 89.8  78.0 - 100.0 fL Final  . MCH 03/14/2017 30.3  26.0 - 34.0 pg Final  . MCHC 03/14/2017 33.7  30.0 - 36.0 g/dL Final  . RDW 03/14/2017 14.0  11.5 - 15.5 % Final  . Platelets 03/14/2017 222  150 - 400 K/uL Final  . Sodium 03/14/2017 138  135 - 145 mmol/L Final  . Potassium 03/14/2017 3.9  3.5 - 5.1 mmol/L Final  . Chloride 03/14/2017 103  101 - 111  mmol/L Final  . CO2 03/14/2017 28  22 - 32 mmol/L Final  . Glucose, Bld 03/14/2017 108* 65 - 99 mg/dL Final  . BUN 03/14/2017 18  6 - 20 mg/dL Final  . Creatinine, Ser 03/14/2017 0.98  0.61 - 1.24 mg/dL Final  . Calcium 03/14/2017 8.3* 8.9 - 10.3 mg/dL Final  . GFR calc non Af Amer 03/14/2017 >60  >60 mL/min Final  . GFR calc Af Amer 03/14/2017 >60  >60 mL/min Final   Comment: (NOTE) The eGFR has been calculated using the CKD EPI equation. This calculation has not been validated in all clinical situations. eGFR's persistently <60 mL/min signify possible Chronic Kidney Disease.   Georgiann Hahn gap 03/14/2017 7  5 - 15 Final  Hospital Outpatient Visit on 03/01/2017  Component Date Value Ref Range Status  . MRSA, PCR 03/01/2017 NEGATIVE  NEGATIVE Final  . Staphylococcus aureus 03/01/2017 POSITIVE* NEGATIVE Final   Comment: (NOTE) The Xpert SA Assay (FDA approved for NASAL specimens in patients 59 years of age and older), is one component of a comprehensive surveillance program. It is not intended to diagnose infection nor to guide or monitor treatment.   Marland Kitchen aPTT 03/01/2017 24  24 - 36 seconds Final  . WBC 03/01/2017 6.5  4.0 - 10.5 K/uL Final  . RBC 03/01/2017 4.80  4.22 - 5.81 MIL/uL Final  . Hemoglobin 03/01/2017 14.5  13.0 - 17.0 g/dL Final  . HCT 03/01/2017 43.6  39.0 - 52.0 % Final  . MCV 03/01/2017 90.8  78.0 - 100.0 fL Final  . MCH 03/01/2017 30.2  26.0 - 34.0 pg Final  . MCHC 03/01/2017 33.3  30.0 - 36.0 g/dL Final  . RDW 03/01/2017 13.8  11.5 - 15.5 % Final  . Platelets 03/01/2017 250  150 - 400 K/uL Final  . Sodium 03/01/2017 141  135 - 145 mmol/L Final  . Potassium 03/01/2017 4.6  3.5 - 5.1 mmol/L Final  . Chloride 03/01/2017 107  101 - 111 mmol/L Final  . CO2 03/01/2017 27  22 - 32 mmol/L Final  . Glucose, Bld 03/01/2017 101* 65 - 99 mg/dL Final  . BUN 03/01/2017 21* 6 - 20 mg/dL Final  . Creatinine, Ser 03/01/2017 0.99  0.61 - 1.24 mg/dL Final  . Calcium 03/01/2017 8.7*  8.9 - 10.3 mg/dL Final  . Total Protein 03/01/2017 6.5  6.5 - 8.1 g/dL Final  . Albumin 03/01/2017 4.0  3.5 - 5.0 g/dL Final  . AST 03/01/2017 30  15 - 41 U/L Final  . ALT 03/01/2017 16* 17 - 63 U/L Final  . Alkaline Phosphatase 03/01/2017 73  38 - 126 U/L Final  . Total  Bilirubin 03/01/2017 0.6  0.3 - 1.2 mg/dL Final  . GFR calc non Af Amer 03/01/2017 >60  >60 mL/min Final  . GFR calc Af Amer 03/01/2017 >60  >60 mL/min Final   Comment: (NOTE) The eGFR has been calculated using the CKD EPI equation. This calculation has not been validated in all clinical situations. eGFR's persistently <60 mL/min signify possible Chronic Kidney Disease.   . Anion gap 03/01/2017 7  5 - 15 Final  . Prothrombin Time 03/01/2017 12.9  11.4 - 15.2 seconds Final  . INR 03/01/2017 0.98   Final  . ABO/RH(D) 03/01/2017 A POS   Final  . Antibody Screen 03/01/2017 NEG   Final  . Sample Expiration 03/01/2017 03/15/2017   Final  . Extend sample reason 03/01/2017 NO TRANSFUSIONS OR PREGNANCY IN THE PAST 3 MONTHS   Final  . ABO/RH(D) 03/01/2017 A POS   Final     X-Rays:No results found.  EKG: Orders placed or performed in visit on 12/13/16  . EKG 12-Lead     Hospital Course: Cole Martinez is a 72 y.o. who was admitted to [facility]. They were brought to the operating room on 03/12/2017 and underwent Procedure(s): RIGHT TOTAL KNEE ARTHROPLASTY CYSTOSCOPY WITH URETHRAL DILATATION.  He was a difficult foley insertion requiring Urology consult.  Patient tolerated the procedure well and was later transferred to the recovery room and then to the orthopaedic floor for postoperative care.  They were given PO and IV analgesics for pain control following their surgery.  They were given 24 hours of postoperative antibiotics of  Anti-infectives (From admission, onward)   Start     Dose/Rate Route Frequency Ordered Stop   03/12/17 1330  ceFAZolin (ANCEF) IVPB 2g/100 mL premix     2 g 200 mL/hr over 30 Minutes  Intravenous Every 6 hours 03/12/17 1050 03/12/17 2041   03/12/17 0602  ceFAZolin (ANCEF) IVPB 2g/100 mL premix     2 g 200 mL/hr over 30 Minutes Intravenous On call to O.R. 03/12/17 0602 03/12/17 0736     and started on DVT prophylaxis in the form of Xarelto.   PT and OT were ordered for total joint protocol.  Discharge planning consulted to help with postop disposition and equipment needs.  Patient had a decent night on the evening of surgery.  They started to get up OOB with therapy on day one. Hemovac drain was pulled without difficulty. Foley was left in place upon recommendation of Urology service. Continued to work with therapy into day two.  Dressing was changed on day two and the incision was healing well.  Patient was seen in rounds on day two and was ready to go home.  Urethral stricture: Pt discharged home with Foley and leg bag.  Will arrange outpatient follow up in 1 week for a voiding trial.  Diet - Cardiac diet Follow up - in 2 weeks with Dr. Wynelle Link Follow up with Urology in one week for voiding trial. Activity - WBAT Disposition - Home Condition Upon Discharge - Stable D/C Meds - See DC Summary DVT Prophylaxis - Xarelto     Discharge Instructions    Call MD / Call 911   Complete by:  As directed    If you experience chest pain or shortness of breath, CALL 911 and be transported to the hospital emergency room.  If you develope a fever above 101 F, pus (white drainage) or increased drainage or redness at the wound, or calf pain, call your surgeon's  office.   Change dressing   Complete by:  As directed    Change dressing daily with sterile 4 x 4 inch gauze dressing and apply TED hose. Do not submerge the incision under water.   Constipation Prevention   Complete by:  As directed    Drink plenty of fluids.  Prune juice may be helpful.  You may use a stool softener, such as Colace (over the counter) 100 mg twice a day.  Use MiraLax (over the counter) for constipation as  needed.   Diet - low sodium heart healthy   Complete by:  As directed    Discharge instructions   Complete by:  As directed    We will schedule follow up in 7-10 days for your catheter to be removed in urology clinic. We will call you with the details of your appointment.   We will schedule follow up in 7-10 days for your catheter to be removed in urology clinic. We will call you with the details of your appointment.   Discharge instructions   Complete by:  As directed    Take Xarelto for two and a half more weeks, then discontinue Xarelto. Once the patient has completed the Xarelto, they may resume the 81 mg Aspirin.   Pick up stool softner and laxative for home use following surgery while on pain medications. Do not submerge incision under water. Please use good hand washing techniques while changing dressing each day. May shower starting three days after surgery. Please use a clean towel to pat the incision dry following showers. Continue to use ice for pain and swelling after surgery. Do not use any lotions or creams on the incision until instructed by your surgeon.  Wear both TED hose on both legs during the day every day for three weeks, but may remove the TED hose at night at home.  Postoperative Constipation Protocol  Constipation - defined medically as fewer than three stools per week and severe constipation as less than one stool per week.  One of the most common issues patients have following surgery is constipation.  Even if you have a regular bowel pattern at home, your normal regimen is likely to be disrupted due to multiple reasons following surgery.  Combination of anesthesia, postoperative narcotics, change in appetite and fluid intake all can affect your bowels.  In order to avoid complications following surgery, here are some recommendations in order to help you during your recovery period.  Colace (docusate) - Pick up an over-the-counter form of Colace or another  stool softener and take twice a day as long as you are requiring postoperative pain medications.  Take with a full glass of water daily.  If you experience loose stools or diarrhea, hold the colace until you stool forms back up.  If your symptoms do not get better within 1 week or if they get worse, check with your doctor.  Dulcolax (bisacodyl) - Pick up over-the-counter and take as directed by the product packaging as needed to assist with the movement of your bowels.  Take with a full glass of water.  Use this product as needed if not relieved by Colace only.   MiraLax (polyethylene glycol) - Pick up over-the-counter to have on hand.  MiraLax is a solution that will increase the amount of water in your bowels to assist with bowel movements.  Take as directed and can mix with a glass of water, juice, soda, coffee, or tea.  Take if you go more than  two days without a movement. Do not use MiraLax more than once per day. Call your doctor if you are still constipated or irregular after using this medication for 7 days in a row.  If you continue to have problems with postoperative constipation, please contact the office for further assistance and recommendations.  If you experience "the worst abdominal pain ever" or develop nausea or vomiting, please contact the office immediatly for further recommendations for treatment.   Do not put a pillow under the knee. Place it under the heel.   Complete by:  As directed    Do not sit on low chairs, stoools or toilet seats, as it may be difficult to get up from low surfaces   Complete by:  As directed    Driving restrictions   Complete by:  As directed    No driving until released by the physician.   Foley to gravity   Complete by:  As directed    Please provide leg bag.   Increase activity slowly as tolerated   Complete by:  As directed    Lifting restrictions   Complete by:  As directed    No lifting until released by the physician.   Patient may shower    Complete by:  As directed    You may shower without a dressing once there is no drainage.  Do not wash over the wound.  If drainage remains, do not shower until drainage stops.   TED hose   Complete by:  As directed    Use stockings (TED hose) for 3 weeks on both leg(s).  You may remove them at night for sleeping.   Weight bearing as tolerated   Complete by:  As directed    Laterality:  right   Extremity:  Lower     Allergies as of 03/14/2017   No Known Allergies     Medication List    STOP taking these medications   aspirin EC 81 MG tablet   celecoxib 200 MG capsule Commonly known as:  CELEBREX   CoQ10 200 MG Caps   Cyanocobalamin 2500 MCG Chew   thiamine 100 MG tablet   VITAMIN E PO     TAKE these medications   amLODipine 2.5 MG tablet Commonly known as:  NORVASC TAKE 1 TABLET BY MOUTH  DAILY   amphetamine-dextroamphetamine 5 MG tablet Commonly known as:  ADDERALL Take 5 mg by mouth 2 (two) times daily.   atorvastatin 20 MG tablet Commonly known as:  LIPITOR Take 20 mg by mouth daily. Pt takes every other day.   methocarbamol 500 MG tablet Commonly known as:  ROBAXIN Take 1 tablet (500 mg total) by mouth every 6 (six) hours as needed for muscle spasms.   omeprazole 20 MG capsule Commonly known as:  PRILOSEC Take 20 mg by mouth daily.   oxyCODONE 5 MG immediate release tablet Commonly known as:  Oxy IR/ROXICODONE Take 1-2 tablets (5-10 mg total) by mouth every 4 (four) hours as needed for moderate pain or severe pain.   rivaroxaban 10 MG Tabs tablet Commonly known as:  XARELTO Take 1 tablet (10 mg total) by mouth daily with breakfast. Take Xarelto for two and a half more weeks following discharge from the hospital, then discontinue Xarelto. Once the patient has completed the Xarelto, they may resume the 81 mg Aspirin.   traMADol 50 MG tablet Commonly known as:  ULTRAM Take 1-2 tablets (50-100 mg total) by mouth every 6 (six) hours as needed (  mild  pain).            Durable Medical Equipment  (From admission, onward)        Start     Ordered   03/14/17 0752  For home use only DME Hospital bed  Once    Question Answer Comment  Patient has (list medical condition): Right Total Knee Arthroplasty   The above medical condition requires: Patient requires the ability to reposition immediately   Bed type Semi-electric      03/14/17 0751       Discharge Care Instructions  (From admission, onward)        Start     Ordered   03/13/17 0000  Weight bearing as tolerated    Question Answer Comment  Laterality right   Extremity Lower      03/13/17 2152   03/13/17 0000  Change dressing    Comments:  Change dressing daily with sterile 4 x 4 inch gauze dressing and apply TED hose. Do not submerge the incision under water.   03/13/17 2152     Follow-up Information    Gaynelle Arabian, MD. Schedule an appointment as soon as possible for a visit on 03/27/2017.   Specialty:  Orthopedic Surgery Contact information: [address]  [place] [address] [phone]        Raynelle Bring, MD. Schedule an appointment as soon as possible for a visit in 1 week(s).   Specialty:  Urology Why:  Call [facility] to confirm an appointment with Dr. Alinda Money for foley removal and voiding trial. Contact information: [place] [address] [phone]           Signed: Arlee Muslim, PA-C Orthopaedic Surgery 03/14/2017, 8:10 AM

## 2017-03-13 NOTE — Evaluation (Signed)
Occupational Therapy Evaluation Patient Details Name: Cole Martinez MRN: [id number] DOB: [birth year] Today's Date: 03/13/2017    History of Present Illness s/p  R TKA, catheter placed by urology;  PMHx: TURP, hernia repair   Clinical Impression   Pt admitted as above currently demonstrating deficits in his ability to perform ADL and self care tasks as well as functional transfers (See OT problem list below). He should benefit from acute OT to assist in maximizing independence with ADL's, functional transfers and for pt/family education. He is currently Min-Mod A overall, will follow acutely.    Follow Up Recommendations       Equipment Recommendations  3 in 1 bedside commode;Other (comment)(? Possible a/e for LB ADL's)    Recommendations for Other Services       Precautions / Restrictions Precautions Precautions: Fall;Knee Required Braces or Orthoses: Knee Immobilizer - Right Knee Immobilizer - Right: Discontinue once straight leg raise with < 10 degree lag Restrictions Weight Bearing Restrictions: Yes LLE Weight Bearing: Weight bearing as tolerated Other Position/Activity Restrictions: WBAT      Mobility Bed Mobility Overal bed mobility: Needs Assistance Bed Mobility: Supine to Sit     Supine to sit: Supervision     General bed mobility comments: No hands on assist  Transfers Overall transfer level: Needs assistance Equipment used: Rolling walker (2 wheeled) Transfers: Sit to/from [facility] Sit to Stand: Min guard Stand pivot transfers: Min assist       General transfer comment: VC's for safety and sequencing with RW    Balance Overall balance assessment: Needs assistance Sitting-balance support: No upper extremity supported;Feet supported Sitting balance-Leahy Scale: Fair     Standing balance support: Bilateral upper extremity supported Standing balance-Leahy Scale: Fair Standing balance comment: Did not attempt standing w/o UE support  this date                           ADL either performed or assessed with clinical judgement   ADL Overall ADL's : Needs assistance/impaired Eating/Feeding: Independent;Sitting   Grooming: Min guard;Standing   Upper Body Bathing: Set up;Sitting;Supervision/ safety   Lower Body Bathing: Sit to/from stand;Moderate assistance   Upper Body Dressing : Set up;Sitting   Lower Body Dressing: Minimal assistance;Sit to/from stand;Cueing for safety;Cueing for sequencing   Toilet Transfer: Min guard;RW;Ambulation;BSC;Cueing for sequencing;Cueing for safety   Toileting- Clothing Manipulation and Hygiene: Minimal assistance;Sit to/from stand       Functional mobility during ADLs: Min guard;Rolling walker;Cueing for safety;Cueing for sequencing General ADL Comments: Pt seen for OT assessment followed by ADL retraining session for bed mobiliy, functional ambulation/transfer to toilet (3:1 over toilet in bathroom), vc's for safety and sequencing with RW. Pt ambulates with wide base of stance and feet turned outward. Issued handout for [facility] A/E and discussed use of LB ADL's w/ pt/spouse. Spouse states that she and her adult son will be able to assist PRN after d/c. Discussed use of foam that may be purchased at craft type store to place under rollaway bed/matress to raise height as pt/family are concerned with pt doing full flight of stairs at home for main bed/bath. Also discussed placing extra foam under couch cushion as another option as pt/family is considering purchasing a recliner chair. Pt should benefit from cont OT to assist in maximizing independence with functonal transfers and LB ADL's.     Vision Baseline Vision/History: Wears glasses Patient Visual Report: No change from baseline  Perception     Praxis      Pertinent Vitals/Pain Pain Assessment: 0-10 Pain Score: 3  Pain Location: right knee Pain Descriptors / Indicators: Discomfort;Sore Pain Intervention(s):  Limited activity within patient's tolerance;Monitored during session;Premedicated before session;Ice applied;Repositioned     Hand Dominance Right   Extremity/Trunk Assessment Upper Extremity Assessment Upper Extremity Assessment: Overall WFL for tasks assessed   Lower Extremity Assessment Lower Extremity Assessment: Defer to PT evaluation   Cervical / Trunk Assessment Cervical / Trunk Assessment: Kyphotic   Communication Communication Communication: No difficulties   Cognition Arousal/Alertness: Awake/alert Behavior During Therapy: WFL for tasks assessed/performed Overall Cognitive Status: Within Functional Limits for tasks assessed                                     General Comments  Pt ambulates with shuffling gait, wide base of stance with feet turned outward most likely to compensate for balance issues. Pt wife states he has been doing this "For last few years". Able to correct somewhat with vc's and use of RW     Exercises     Shoulder Instructions      Home Living Family/patient expects to be discharged to:: Private residence Living Arrangements: Spouse/significant other Available Help at Discharge: Family Type of Home: House Home Access: Stairs to enter [employer] of Steps: 2-3 Entrance Stairs-Rails: None Home Layout: Two level;Bed/bath upstairs;Able to live on main level with bedroom/bathroom;1/2 bath on main level Alternate Level Stairs-Number of Steps: 13   Bathroom Shower/Tub: Walk-in shower(upstairs)   Biochemist, clinical: (Comfort height)     Home Equipment: Environmental consultant - 2 wheels;Bedside commode   Additional Comments: has a bed & 1/2 bath downstairs if needed       Prior Functioning/Environment Level of Independence: Independent                 OT Problem List: Decreased strength;Decreased activity tolerance;Decreased knowledge of use of DME or AE;Pain;Impaired balance (sitting and/or standing)      OT  Treatment/Interventions: Self-care/ADL training;DME and/or AE instruction;Therapeutic activities;Patient/family education(Balance to be addressed during functional activity/ADL's)    OT Goals(Current goals can be found in the care plan section) Acute Rehab OT Goals Patient Stated Goal: To be able to get upstairs to main bed/bathroom OT Goal Formulation: With patient Time For Goal Achievement: 03/27/17 Potential to Achieve Goals: Good  OT Frequency: Min 2X/week   Barriers to D/C:            Co-evaluation              AM-PAC PT "6 Clicks" Daily Activity     Outcome Measure Help from another person eating meals?: None Help from another person taking care of personal grooming?: A Little Help from another person toileting, which includes using toliet, bedpan, or urinal?: A Little Help from another person bathing (including washing, rinsing, drying)?: A Little Help from another person to put on and taking off regular upper body clothing?: A Little Help from another person to put on and taking off regular lower body clothing?: A Lot 6 Click Score: 18   End of Session Equipment Utilized During Treatment: Gait belt;Rolling walker;Right knee immobilizer  Activity Tolerance: Patient tolerated treatment well Patient left: in chair;with call bell/phone within reach;with chair alarm set;with family/visitor present  OT Visit Diagnosis: Unsteadiness on feet (R26.81);Muscle weakness (generalized) (M62.81);Pain Pain - Right/Left: Right Pain - part of body: Knee  Time: 7639-4320 OT Time Calculation (min): 40 min Charges:  OT General Charges $OT Visit: 1 Visit OT Evaluation $OT Eval Low Complexity: 1 Low(15 min) OT Treatments $Self Care/Home Management : 23-37 mins(25 min) G-Codes:      [name], OTR/L 03/13/2017, 11:42 AM

## 2017-03-13 NOTE — Progress Notes (Signed)
   Subjective: 1 Day Post-Op Procedure(s) (LRB): RIGHT TOTAL KNEE ARTHROPLASTY (Right) CYSTOSCOPY WITH URETHRAL DILATATION Patient reports pain as mild.   Patient seen in rounds with Dr. Wynelle Link. Patient is well, but has had some minor complaints of pain in the knee, requiring pain medications We will start therapy today.  Plan is to go Home after hospital stay.  Objective: Vital signs in last 24 hours: Temp:  [97.6 F (36.4 C)-98 F (36.7 C)] 97.9 F (36.6 C) (01/08 1348) Pulse Rate:  [86-96] 96 (01/08 1348) Resp:  [16-17] 16 (01/08 2031) BP: (121-162)/(70-93) 162/80 (01/08 2031) SpO2:  [95 %-99 %] 95 % (01/08 2031)  Intake/Output from previous day:  Intake/Output Summary (Last 24 hours) at 03/13/2017 2146 Last data filed at 03/13/2017 2025 Gross per 24 hour  Intake 2426.66 ml  Output 3355 ml  Net -928.34 ml    Intake/Output this shift: Total I/O In: 120 [P.O.:120] Out: -   Labs: Recent Labs    03/13/17 0612  HGB 13.3   Recent Labs    03/13/17 0612  WBC 15.5*  RBC 4.49  HCT 40.5  PLT 235   Recent Labs    03/13/17 0612  NA 138  K 4.4  CL 107  CO2 27  BUN 17  CREATININE 0.90  GLUCOSE 121*  CALCIUM 8.3*   No results for input(s): LABPT, INR in the last 72 hours.  EXAM General - Patient is Alert, Appropriate and Oriented Extremity - Neurovascular intact Sensation intact distally Intact pulses distally Dorsiflexion/Plantar flexion intact Dressing - dressing C/D/I Motor Function - intact, moving foot and toes well on exam.  Hemovac pulled without difficulty.  Past Medical History:  Diagnosis Date  . ADHD (attention deficit hyperactivity disorder)   . B12 deficiency   . BPH (benign prostatic hypertrophy)   . 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 Coronary artery disease CARDIOLOGIST- DR Daneen Schick   S/P STENTING LAD 1999  . Crohn's  disease of ileum ([place]) SINCE 1988  . ED (erectile dysfunction)   . Elevated PSA   . Essential and other specified forms of tremor 11/25/2012  . H/O adenomatous polyp of colon   . Hyperlipidemia   . Nocturia   . OA (osteoarthritis)   . Peripheral neuropathy    hx of, none current as of 08-04-13  . Peyronie disease   . S/P coronary artery stent placement OCT 1999 OF LAD  . Tremor, hereditary, benign MILD RIGHT HAND    Assessment/Plan: 1 Day Post-Op Procedure(s) (LRB): RIGHT TOTAL KNEE ARTHROPLASTY (Right) CYSTOSCOPY WITH URETHRAL DILATATION Principal Problem:   OA (osteoarthritis) of knee  Estimated body mass index is 30.09 kg/m as calculated from the following:   Height as of this encounter: 5' 7.5" (1.715 m).   Weight as of this encounter: 88.5 kg (195 lb). Advance diet Up with therapy Continue foley due to bladder outlet obstruction Plan for discharge tomorrow   Keep foley in place. Will go home with foley catheter indwelling until follow up with Urology.  DVT Prophylaxis - Xarelto Weight-Bearing as tolerated to right leg D/C O2 and Pulse OX and try on Room Air  Arlee Muslim, PA-C Orthopaedic Surgery 03/13/2017, 9:46 PM

## 2017-03-14 ENCOUNTER — Ambulatory Visit: Payer: Medicare Other | Admitting: Neurology

## 2017-03-14 LAB — CBC
HCT: 36.2 % — ABNORMAL LOW (ref 39.0–52.0)
Hemoglobin: 12.2 g/dL — ABNORMAL LOW (ref 13.0–17.0)
MCH: 30.3 pg (ref 26.0–34.0)
MCHC: 33.7 g/dL (ref 30.0–36.0)
MCV: 89.8 fL (ref 78.0–100.0)
Platelets: 222 10*3/uL (ref 150–400)
RBC: 4.03 MIL/uL — ABNORMAL LOW (ref 4.22–5.81)
RDW: 14 % (ref 11.5–15.5)
WBC: 15.2 10*3/uL — ABNORMAL HIGH (ref 4.0–10.5)

## 2017-03-14 LAB — BASIC METABOLIC PANEL
Anion gap: 7 (ref 5–15)
BUN: 18 mg/dL (ref 6–20)
CO2: 28 mmol/L (ref 22–32)
Calcium: 8.3 mg/dL — ABNORMAL LOW (ref 8.9–10.3)
Chloride: 103 mmol/L (ref 101–111)
Creatinine, Ser: 0.98 mg/dL (ref 0.61–1.24)
GFR calc Af Amer: >60 mL/min (ref >60)
GFR calc non Af Amer: >60 mL/min (ref >60)
Glucose, Bld: 108 mg/dL — ABNORMAL HIGH (ref 65–99)
Potassium: 3.9 mmol/L (ref 3.5–5.1)
Sodium: 138 mmol/L (ref 135–145)

## 2017-03-14 NOTE — Progress Notes (Signed)
Physical Therapy Treatment Patient Details Name: Cole Martinez MRN: [id number] DOB: May 10, 1945 Today's Date: 03/14/2017    History of Present Illness s/p  R TKA, catheter placed by urology;  PMHx: TURP, hernia repair    PT Comments    POD # 2 Pt needed a third session to practice stairs one more time before safe D/C.  Pt plans to have a third person assist at the house.   Follow Up Recommendations  DC plan and follow up therapy as arranged by surgeon     Equipment Recommendations  None recommended by PT    Recommendations for Other Services       Precautions / Restrictions Precautions Precautions: Fall;Knee Precaution Comments: instructed on KI use for stairs and extended amb Required Braces or Orthoses: Knee Immobilizer - Right Knee Immobilizer - Right: Discontinue once straight leg raise with < 10 degree lag Restrictions Weight Bearing Restrictions: No LLE Weight Bearing: Weight bearing as tolerated Other Position/Activity Restrictions: WBAT    Mobility  Bed Mobility   General bed mobility comments: OOB in recliner  Transfers Overall transfer level: Needs assistance Equipment used: Rolling walker (2 wheeled) Transfers: Sit to/from Stand Sit to Stand: Min guard Stand pivot transfers: Min guard       General transfer comment: <25% VC's on safety with backward gait  Ambulation/Gait Ambulation/Gait assistance: Supervision;Min guard Ambulation Distance (Feet): 85 Feet Assistive device: Rolling walker (2 wheeled) Gait Pattern/deviations: Step-to pattern;Trunk flexed Gait velocity: decreased   General Gait Details: <25% VC's on safety with turns and upright posture   Stairs Stairs: Yes   Stair Management: No rails;With walker;Backwards Number of Stairs: 3 General stair comments: 2 different situations.  First, up 2 backward with walker no rails and up one step forward     performed with spouse "hands on".   Wheelchair Mobility    Modified Rankin  (Stroke Patients Only)       Balance                                            Cognition Arousal/Alertness: Awake/alert Behavior During Therapy: WFL for tasks assessed/performed Overall Cognitive Status: Within Functional Limits for tasks assessed                                        Exercises      General Comments        Pertinent Vitals/Pain Pain Assessment: 0-10 Pain Score: 8  Pain Location: right knee Pain Descriptors / Indicators: Discomfort;Sore Pain Intervention(s): Monitored during session;RN gave pain meds during session;Ice applied;Patient requesting pain meds-RN notified    Home Living                      Prior Function            PT Goals (current goals can now be found in the care plan section) Progress towards PT goals: Progressing toward goals    Frequency    7X/week      PT Plan Current plan remains appropriate    Co-evaluation              AM-PAC PT "6 Clicks" Daily Activity  Outcome Measure  Difficulty turning over in bed (including adjusting bedclothes, sheets and blankets)?: Unable Difficulty moving from  lying on back to sitting on the side of the bed? : Unable Difficulty sitting down on and standing up from a chair with arms (e.g., wheelchair, bedside commode, etc,.)?: Unable Help needed moving to and from a bed to chair (including a wheelchair)?: A Little Help needed walking in hospital room?: A Little Help needed climbing 3-5 steps with a railing? : A Little 6 Click Score: 12    End of Session Equipment Utilized During Treatment: Gait belt;Right knee immobilizer Activity Tolerance: Patient limited by fatigue Patient left: in chair;with family/visitor present;with call bell/phone within reach Nurse Communication: Mobility status PT Visit Diagnosis: Difficulty in walking, not elsewhere classified (R26.2)     Time: 1335([id number])-1400 PT Time Calculation (min) (ACUTE ONLY):  25 min  Charges:  $Gait Training: 8-22 mins $Therapeutic Activity: 8-22 mins                    G Codes:       Rica Koyanagi  PTA [facility]  Acute  Rehab Pager      [phone]

## 2017-03-14 NOTE — Progress Notes (Signed)
    Durable Medical Equipment  (From admission, onward)        Start     Ordered   03/14/17 0752  For home use only DME Hospital bed  Once    Question Answer Comment  Patient has (list medical condition): Right Total Knee Arthroplasty   The above medical condition requires: Patient requires the ability to reposition immediately   Bed type Semi-electric      03/14/17 0751

## 2017-03-14 NOTE — Progress Notes (Signed)
Physical Therapy Treatment Patient Details Name: Cole Martinez MRN: [id number] DOB: May 26, 1945 Today's Date: 03/14/2017    History of Present Illness s/p  R TKA, catheter placed by urology;  PMHx: TURP, hernia repair    PT Comments    POD # 2 pm session With spouse present practiced stairs.  Instructed on proper KI application and use.   Pt will need another PT session to practice stairs again for a safe D/C.    Follow Up Recommendations  DC plan and follow up therapy as arranged by surgeon     Equipment Recommendations  None recommended by PT    Recommendations for Other Services       Precautions / Restrictions Precautions Precautions: Fall;Knee Precaution Comments: instructed on KI use for stairs and extended amb Required Braces or Orthoses: Knee Immobilizer - Right Knee Immobilizer - Right: Discontinue once straight leg raise with < 10 degree lag Restrictions Weight Bearing Restrictions: No LLE Weight Bearing: Weight bearing as tolerated Other Position/Activity Restrictions: WBAT    Mobility  Bed Mobility Overal bed mobility: Needs Assistance Bed Mobility: Supine to Sit     Supine to sit: Min assist     General bed mobility comments: OOB in recliner  Transfers Overall transfer level: Needs assistance Equipment used: Rolling walker (2 wheeled) Transfers: Sit to/from Stand Sit to Stand: Min guard Stand pivot transfers: Min guard       General transfer comment: <25% VC's on safety with backward gait  Ambulation/Gait Ambulation/Gait assistance: Supervision;Min guard Ambulation Distance (Feet): 45 Feet Assistive device: Rolling walker (2 wheeled) Gait Pattern/deviations: Step-to pattern;Trunk flexed Gait velocity: decreased   General Gait Details: <25% VC's on safety with turns and upright posture   Stairs Stairs: Yes   Stair Management: No rails;With walker;Backwards Number of Stairs: 3 General stair comments: 2 different situations.  First, up  2 backward with walker no rails and up one step forward     performed with spouse "hands on".   Wheelchair Mobility    Modified Rankin (Stroke Patients Only)       Balance                                            Cognition Arousal/Alertness: Awake/alert Behavior During Therapy: WFL for tasks assessed/performed Overall Cognitive Status: Within Functional Limits for tasks assessed                                        Exercises      General Comments        Pertinent Vitals/Pain Pain Assessment: 0-10 Pain Score: 8  Pain Location: right knee Pain Descriptors / Indicators: Discomfort;Sore Pain Intervention(s): Monitored during session;RN gave pain meds during session;Ice applied;Patient requesting pain meds-RN notified    Home Living                      Prior Function            PT Goals (current goals can now be found in the care plan section) Progress towards PT goals: Progressing toward goals    Frequency    7X/week      PT Plan Current plan remains appropriate    Co-evaluation  AM-PAC PT "6 Clicks" Daily Activity  Outcome Measure  Difficulty turning over in bed (including adjusting bedclothes, sheets and blankets)?: Unable Difficulty moving from lying on back to sitting on the side of the bed? : Unable Difficulty sitting down on and standing up from a chair with arms (e.g., wheelchair, bedside commode, etc,.)?: Unable Help needed moving to and from a bed to chair (including a wheelchair)?: A Little Help needed walking in hospital room?: A Little Help needed climbing 3-5 steps with a railing? : A Little 6 Click Score: 12    End of Session Equipment Utilized During Treatment: Gait belt;Right knee immobilizer Activity Tolerance: Patient limited by fatigue Patient left: in chair;with family/visitor present;with call bell/phone within reach Nurse Communication: Mobility status PT Visit  Diagnosis: Difficulty in walking, not elsewhere classified (R26.2)     Time: 1308-6578 PT Time Calculation (min) (ACUTE ONLY): 24 min  Charges:  $Gait Training: 8-22 mins $Therapeutic Activity: 8-22 mins                    G Codes:       Rica Koyanagi  PTA [facility]  Acute  Rehab Pager      [phone]

## 2017-03-14 NOTE — Progress Notes (Signed)
   Subjective: 2 Days Post-Op Procedure(s) (LRB): RIGHT TOTAL KNEE ARTHROPLASTY (Right) CYSTOSCOPY WITH URETHRAL DILATATION Patient reports pain as mild.   Patient seen in rounds with Dr. Wynelle Link. Patient is well, and has had no acute complaints or problems Patient is ready to go home  Objective: Vital signs in last 24 hours: Temp:  [97.7 F (36.5 C)-98.2 F (36.8 C)] 98.2 F (36.8 C) (01/09 0548) Pulse Rate:  [87-96] 88 (01/09 0548) Resp:  [16-17] 16 (01/09 0548) BP: (121-162)/(70-80) 139/72 (01/09 0548) SpO2:  [90 %-96 %] 90 % (01/09 0548)  Intake/Output from previous day:  Intake/Output Summary (Last 24 hours) at 03/14/2017 0809 Last data filed at 03/14/2017 0702 Gross per 24 hour  Intake 1580 ml  Output 2450 ml  Net -870 ml    Intake/Output this shift: No intake/output data recorded.  Labs: Recent Labs    03/13/17 0612 03/14/17 0533  HGB 13.3 12.2*   Recent Labs    03/13/17 0612 03/14/17 0533  WBC 15.5* 15.2*  RBC 4.49 4.03*  HCT 40.5 36.2*  PLT 235 222   Recent Labs    03/13/17 0612 03/14/17 0533  NA 138 138  K 4.4 3.9  CL 107 103  CO2 27 28  BUN 17 18  CREATININE 0.90 0.98  GLUCOSE 121* 108*  CALCIUM 8.3* 8.3*   No results for input(s): LABPT, INR in the last 72 hours.  EXAM: General - Patient is Alert and Appropriate Extremity - Neurovascular intact Sensation intact distally Intact pulses distally Incision - clean, dry, no drainage Motor Function - intact, moving foot and toes well on exam.   Assessment/Plan: 2 Days Post-Op Procedure(s) (LRB): RIGHT TOTAL KNEE ARTHROPLASTY (Right) CYSTOSCOPY WITH URETHRAL DILATATION Procedure(s) (LRB): RIGHT TOTAL KNEE ARTHROPLASTY (Right) CYSTOSCOPY WITH URETHRAL DILATATION Past Medical History:  Diagnosis Date  . ADHD (attention deficit hyperactivity disorder)   . B12 deficiency   . BPH (benign prostatic hypertrophy)   . 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 Coronary artery disease CARDIOLOGIST- DR Daneen Schick   S/P STENTING LAD 1999  . Crohn's disease of ileum ([place]) SINCE 1988  . ED (erectile dysfunction)   . Elevated PSA   . Essential and other specified forms of tremor 11/25/2012  . H/O adenomatous polyp of colon   . Hyperlipidemia   . Nocturia   . OA (osteoarthritis)   . Peripheral neuropathy    hx of, none current as of 08-04-13  . Peyronie disease   . S/P coronary artery stent placement OCT 1999 OF LAD  . Tremor, hereditary, benign MILD RIGHT HAND   Principal Problem:   OA (osteoarthritis) of knee  Estimated body mass index is 30.09 kg/m as calculated from the following:   Height as of this encounter: 5' 7.5" (1.715 m).   Weight as of this encounter: 88.5 kg (195 lb). Up with therapy Diet - Cardiac diet Follow up - in 2 weeks Activity - WBAT Disposition - Home Condition Upon Discharge - Stable D/C Meds - See DC Summary DVT Prophylaxis - Xarelto  Arlee Muslim, PA-C Orthopaedic Surgery 03/14/2017, 8:09 AM

## 2017-03-14 NOTE — Progress Notes (Signed)
Occupational Therapy Treatment Patient Details Name: BARUCH LEWERS MRN: [id number] DOB: [birth year] Today's Date: 03/14/2017    History of present illness s/p  R TKA, catheter placed by urology;  PMHx: TURP, hernia repair   OT comments  All education was completed this session  Follow Up Recommendations  Supervision/Assistance - 24 hour    Equipment Recommendations  3 in 1 bedside commode;Other (comment)    Recommendations for Other Services      Precautions / Restrictions Precautions Precautions: Fall;Knee Precaution Comments: reviewed precautions, pt and wife verbalize Required Braces or Orthoses: Knee Immobilizer - Right Knee Immobilizer - Right: Discontinue once straight leg raise with < 10 degree lag Restrictions LLE Weight Bearing: Weight bearing as tolerated       Mobility Bed Mobility            oob      Transfers   Equipment used: Rolling walker (2 wheeled)   Sit to Stand: Min guard         General transfer comment: for safety    Balance                                           ADL either performed or assessed with clinical judgement   ADL               Lower Body Bathing: Minimal assistance;Sit to/from stand       Lower Body Dressing: Moderate assistance;Sit to/from stand                 General ADL Comments:Performed bathing and dressing.   wife assisted with LB dressing and donning KI with supervision.  Pt feels he will not shower initially as shower is on 2nd floor.  Demonstrated stepping over shower stall     Vision       Perception     Praxis      Cognition Arousal/Alertness: Awake/alert Behavior During Therapy: WFL for tasks assessed/performed Overall Cognitive Status: Within Functional Limits for tasks assessed                                          Exercises     Shoulder Instructions       General Comments      Pertinent Vitals/ Pain       Pain Score: 4  Pain  Location: right knee Pain Descriptors / Indicators: Discomfort;Sore Pain Intervention(s): Limited activity within patient's tolerance;Monitored during session;Premedicated before session;Repositioned;Ice applied  Home Living                                          Prior Functioning/Environment              Frequency           Progress Toward Goals  OT Goals(current goals can now be found in the care plan section)  Progress towards OT goals: Not progressing toward goals - comment(no further OT is needed)     Plan      Co-evaluation                 AM-PAC PT "6 Clicks" Daily Activity     Outcome  Measure   Help from another person eating meals?: None Help from another person taking care of personal grooming?: A Little Help from another person toileting, which includes using toliet, bedpan, or urinal?: A Little Help from another person bathing (including washing, rinsing, drying)?: A Little Help from another person to put on and taking off regular upper body clothing?: A Little Help from another person to put on and taking off regular lower body clothing?: A Lot 6 Click Score: 18    End of Session    OT Visit Diagnosis: Pain Pain - Right/Left: Right Pain - part of body: Knee   Activity Tolerance Patient tolerated treatment well   Patient Left in chair;with call bell/phone within reach;with chair alarm set;with family/visitor present   Nurse Communication          Time: 5369-2230 OT Time Calculation (min): 36 min  Charges: OT General Charges $OT Visit: 1 Visit OT Treatments $Self Care/Home Management : 23-37 mins  Lesle Chris, OTR/L [phone] 03/14/2017   Batsheva Stevick 03/14/2017, 10:08 AM

## 2017-03-14 NOTE — Progress Notes (Signed)
Physical Therapy Treatment Patient Details Name: Cole Martinez MRN: [id number] DOB: 1945/11/03 Today's Date: 03/14/2017    History of Present Illness s/p  R TKA, catheter placed by urology;  PMHx: TURP, hernia repair    PT Comments    POD # 2 Assisted OOB to amb to bathroom only this session.   Follow Up Recommendations  DC plan and follow up therapy as arranged by surgeon     Equipment Recommendations  None recommended by PT    Recommendations for Other Services       Precautions / Restrictions Precautions Precautions: Fall;Knee Precaution Comments: instructed on KI use for stairs and extended amb Required Braces or Orthoses: Knee Immobilizer - Right Knee Immobilizer - Right: Discontinue once straight leg raise with < 10 degree lag Restrictions Weight Bearing Restrictions: No LLE Weight Bearing: Weight bearing as tolerated Other Position/Activity Restrictions: WBAT    Mobility  Bed Mobility Overal bed mobility: Needs Assistance Bed Mobility: Supine to Sit     Supine to sit: Min assist     General bed mobility comments: assist R LE and increased time  Transfers Overall transfer level: Needs assistance Equipment used: Rolling walker (2 wheeled) Transfers: Sit to/from Stand Sit to Stand: Min guard Stand pivot transfers: Min assist       General transfer comment: 25% VC's on safety with turns and toilet transfer  Ambulation/Gait Ambulation/Gait assistance: Min guard;Min assist Ambulation Distance (Feet): 18 Feet Assistive device: Rolling walker (2 wheeled) Gait Pattern/deviations: Step-to pattern;Trunk flexed Gait velocity: decreased   General Gait Details: cues for RW safety, use of UEs for pain control     amb from bed to bathroom   Stairs            Wheelchair Mobility    Modified Rankin (Stroke Patients Only)       Balance                                            Cognition Arousal/Alertness:  Awake/alert Behavior During Therapy: WFL for tasks assessed/performed Overall Cognitive Status: Within Functional Limits for tasks assessed                                        Exercises      General Comments        Pertinent Vitals/Pain Pain Assessment: 0-10 Pain Score: 8  Pain Location: right knee Pain Descriptors / Indicators: Discomfort;Sore Pain Intervention(s): Monitored during session;RN gave pain meds during session;Ice applied;Patient requesting pain meds-RN notified    Home Living                      Prior Function            PT Goals (current goals can now be found in the care plan section) Progress towards PT goals: Progressing toward goals    Frequency    7X/week      PT Plan Current plan remains appropriate    Co-evaluation              AM-PAC PT "6 Clicks" Daily Activity  Outcome Measure  Difficulty turning over in bed (including adjusting bedclothes, sheets and blankets)?: Unable Difficulty moving from lying on back to sitting on the side of the bed? : Unable  Difficulty sitting down on and standing up from a chair with arms (e.g., wheelchair, bedside commode, etc,.)?: Unable Help needed moving to and from a bed to chair (including a wheelchair)?: A Little Help needed walking in hospital room?: A Little Help needed climbing 3-5 steps with a railing? : A Little 6 Click Score: 12    End of Session Equipment Utilized During Treatment: Gait belt;Right knee immobilizer Activity Tolerance: Patient limited by pain Patient left: Other (comment)(in bathroom)   PT Visit Diagnosis: Difficulty in walking, not elsewhere classified (R26.2)     Time: 9373-4287 PT Time Calculation (min) (ACUTE ONLY): 10 min  Charges:  $Gait Training: 8-22 mins                    G Codes:       {Kelvyn Schunk  PTA [facility]  Acute  Rehab Pager      [phone] '

## 2017-03-14 NOTE — Progress Notes (Signed)
Patient and family was taught how to take care of leg bag and how to do catheter care.

## 2017-03-16 DIAGNOSIS — M25661 Stiffness of right knee, not elsewhere classified: Secondary | ICD-10-CM | POA: Diagnosis not present

## 2017-03-19 DIAGNOSIS — M25661 Stiffness of right knee, not elsewhere classified: Secondary | ICD-10-CM | POA: Diagnosis not present

## 2017-03-20 ENCOUNTER — Emergency Department (HOSPITAL_COMMUNITY): Payer: Medicare Other

## 2017-03-20 ENCOUNTER — Emergency Department (HOSPITAL_COMMUNITY)
Admission: EM | Admit: 2017-03-20 | Discharge: 2017-03-20 | Disposition: A | Discharge status: Home / Self Care | Payer: Medicare Other | Attending: Emergency Medicine | Admitting: Emergency Medicine

## 2017-03-20 ENCOUNTER — Encounter (HOSPITAL_COMMUNITY): Payer: Self-pay | Admitting: Emergency Medicine

## 2017-03-20 DIAGNOSIS — Z955 Presence of coronary angioplasty implant and graft: Secondary | ICD-10-CM | POA: Diagnosis not present

## 2017-03-20 DIAGNOSIS — I10 Essential (primary) hypertension: Secondary | ICD-10-CM | POA: Diagnosis not present

## 2017-03-20 DIAGNOSIS — I259 Chronic ischemic heart disease, unspecified: Secondary | ICD-10-CM | POA: Insufficient documentation

## 2017-03-20 DIAGNOSIS — Z7901 Long term (current) use of anticoagulants: Secondary | ICD-10-CM | POA: Insufficient documentation

## 2017-03-20 DIAGNOSIS — Z79899 Other long term (current) drug therapy: Secondary | ICD-10-CM | POA: Insufficient documentation

## 2017-03-20 DIAGNOSIS — R531 Weakness: Secondary | ICD-10-CM | POA: Diagnosis not present

## 2017-03-20 DIAGNOSIS — R404 Transient alteration of awareness: Secondary | ICD-10-CM

## 2017-03-20 DIAGNOSIS — Z87891 Personal history of nicotine dependence: Secondary | ICD-10-CM | POA: Diagnosis not present

## 2017-03-20 DIAGNOSIS — R4182 Altered mental status, unspecified: Secondary | ICD-10-CM | POA: Diagnosis not present

## 2017-03-20 DIAGNOSIS — N35011 Post-traumatic bulbous urethral stricture: Secondary | ICD-10-CM | POA: Diagnosis not present

## 2017-03-20 DIAGNOSIS — J9811 Atelectasis: Secondary | ICD-10-CM | POA: Diagnosis not present

## 2017-03-20 LAB — URINALYSIS, ROUTINE W REFLEX MICROSCOPIC
Bilirubin Urine: NEGATIVE
Glucose, UA: NEGATIVE mg/dL
Ketones, ur: NEGATIVE mg/dL
Nitrite: NEGATIVE
Protein, ur: NEGATIVE mg/dL
Specific Gravity, Urine: 1.008 (ref 1.005–1.030)
Squamous Epithelial / LPF: NONE SEEN
pH: 7 (ref 5.0–8.0)

## 2017-03-20 LAB — CBC WITH DIFFERENTIAL/PLATELET
Basophils Absolute: 0 10*3/uL (ref 0.0–0.1)
Basophils Relative: 0 %
Eosinophils Absolute: 0 10*3/uL (ref 0.0–0.7)
Eosinophils Relative: 0 %
HCT: 37.1 % — ABNORMAL LOW (ref 39.0–52.0)
Hemoglobin: 12.8 g/dL — ABNORMAL LOW (ref 13.0–17.0)
Lymphocytes Relative: 3 %
Lymphs Abs: 0.9 10*3/uL (ref 0.7–4.0)
MCH: 30.7 pg (ref 26.0–34.0)
MCHC: 34.5 g/dL (ref 30.0–36.0)
MCV: 89 fL (ref 78.0–100.0)
Monocytes Absolute: 2.6 10*3/uL — ABNORMAL HIGH (ref 0.1–1.0)
Monocytes Relative: 9 %
Neutro Abs: 25.2 10*3/uL — ABNORMAL HIGH (ref 1.7–7.7)
Neutrophils Relative %: 88 %
Platelets: 406 10*3/uL — ABNORMAL HIGH (ref 150–400)
RBC: 4.17 MIL/uL — ABNORMAL LOW (ref 4.22–5.81)
RDW: 13.7 % (ref 11.5–15.5)
WBC: 28.7 10*3/uL — ABNORMAL HIGH (ref 4.0–10.5)

## 2017-03-20 LAB — COMPREHENSIVE METABOLIC PANEL
ALT: 29 U/L (ref 17–63)
AST: 28 U/L (ref 15–41)
Albumin: 3.9 g/dL (ref 3.5–5.0)
Alkaline Phosphatase: 75 U/L (ref 38–126)
Anion gap: 9 (ref 5–15)
BUN: 23 mg/dL — ABNORMAL HIGH (ref 6–20)
CO2: 25 mmol/L (ref 22–32)
Calcium: 8.5 mg/dL — ABNORMAL LOW (ref 8.9–10.3)
Chloride: 98 mmol/L — ABNORMAL LOW (ref 101–111)
Creatinine, Ser: 1.14 mg/dL (ref 0.61–1.24)
GFR calc Af Amer: >60 mL/min (ref >60)
GFR calc non Af Amer: >60 mL/min (ref >60)
Glucose, Bld: 113 mg/dL — ABNORMAL HIGH (ref 65–99)
Potassium: 4.2 mmol/L (ref 3.5–5.1)
Sodium: 132 mmol/L — ABNORMAL LOW (ref 135–145)
Total Bilirubin: 1.7 mg/dL — ABNORMAL HIGH (ref 0.3–1.2)
Total Protein: 7.3 g/dL (ref 6.5–8.1)

## 2017-03-20 LAB — PROTIME-INR
INR: 1.48
Prothrombin Time: 17.8 seconds — ABNORMAL HIGH (ref 11.4–15.2)

## 2017-03-20 LAB — I-STAT TROPONIN, ED: Troponin i, poc: 0.01 ng/mL (ref 0.00–0.08)

## 2017-03-20 LAB — I-STAT CG4 LACTIC ACID, ED: Lactic Acid, Venous: 1.35 mmol/L (ref 0.5–1.9)

## 2017-03-20 MED ORDER — SODIUM CHLORIDE 0.9 % IV BOLUS (SEPSIS)
1000.0000 mL | Freq: Once | INTRAVENOUS | Status: AC
  Administered 2017-03-20: 1000 mL via INTRAVENOUS

## 2017-03-20 MED ORDER — ACETAMINOPHEN 500 MG PO TABS
1000.0000 mg | ORAL_TABLET | Freq: Once | ORAL | Status: AC
  Administered 2017-03-20: 1000 mg via ORAL
  Filled 2017-03-20: qty 2

## 2017-03-20 MED ORDER — BACITRACIN ZINC 500 UNIT/GM EX OINT
TOPICAL_OINTMENT | CUTANEOUS | Status: AC
  Administered 2017-03-20: 19:00:00
  Filled 2017-03-20: qty 0.9

## 2017-03-20 NOTE — ED Provider Notes (Signed)
[facility] DEPT Provider Note   CSN: [id number] Arrival date & time: 03/20/17  1441     History   Chief Complaint Chief Complaint  Patient presents with  . Altered Mental Status    HPI Cole Martinez is a 72 y.o. male.  Pt presents to the ED today with altered mental status.  The pt had a right knee replacement a week ago.  During surgery, a foley was attempted and unable to be placed by nursing.  Urology came to the OR and placed it.  The pt had his foley in for a week and it was removed today at [facility].  Pt normal mental status earlier today.  Pt urinated after the foley was removed.  He came back to the house and he took a nap.  He woke up and was not acting his normal self.  The pt was only alert to self.  The pt denies any new pain.      Past Medical History:  Diagnosis Date  . ADHD (attention deficit hyperactivity disorder)   . B12 deficiency   . BPH (benign prostatic hypertrophy)   . 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 Coronary artery disease CARDIOLOGIST- DR Daneen Schick   S/P STENTING LAD 1999  . Crohn's disease of ileum ([place]) SINCE 1988  . ED (erectile dysfunction)   . Elevated PSA   . Essential and other specified forms of tremor 11/25/2012  . H/O adenomatous polyp of colon   . Hyperlipidemia   . Nocturia   . OA (osteoarthritis)   . Peripheral neuropathy    hx of, none current as of 08-04-13  . Peyronie disease   . S/P coronary artery stent placement OCT 1999 OF LAD  . Tremor, hereditary, benign MILD RIGHT HAND    Patient Active Problem List   Diagnosis Date Noted  . OA (osteoarthritis) of knee 03/12/2017  . Tremor, essential 12/03/2015  . Essential hypertension 12/09/2014  . Coronary artery disease involving native heart 12/08/2013  . 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[place]) 12/08/2013  . Hyperlipidemia 12/08/2013    . Benign prostatic hypertrophy 08/08/2013  . Essential and other specified forms of tremor 11/25/2012    Past Surgical History:  Procedure Laterality Date  . CARDIOVASCULAR STRESS TEST  11-01-2010 DR Daneen Schick   NORMAL PERFUSION STUDY/ EF 64%/ NO ISCHEMIA  . COLONOSCOPY WITH PROPOFOL N/A 09/24/2012   Procedure: COLONOSCOPY WITH PROPOFOL;  Surgeon: [name], MD;  Location: [facility];  Service: Endoscopy;  Laterality: N/A;  . CORONARY ANGIOPLASTY WITH STENT PLACEMENT  OCT 1999   STENT OF LAD  . CYSTOSCOPY WITH URETHRAL DILATATION  03/12/2017   Procedure: CYSTOSCOPY WITH URETHRAL DILATATION;  Surgeon: Gaynelle Arabian, MD;  Location: [facility];  Service: Orthopedics;;  Ammie Dalton, Resident Assisting  . LAPAROSCOPIC INGUINAL HERNIA REPAIR Bilateral 01-10-2004   W/ MESH  . neck benign removed from neck  yrs ago  . PROSTATE BIOPSY N/A 07/12/2012   Procedure: PROSTATE BIOPSY AND ULTRASOUND;  Surgeon: Ailene Rud, MD;  Location: [facility];  Service: Urology;  Laterality: N/A;  . PROSTATE SURGERY  2002   tuna  . REMOVAL LEFT NECK LYMPH NODE  1996  . TONSILLECTOMY  CHILD  . TOTAL KNEE ARTHROPLASTY Right 03/12/2017   Procedure: RIGHT TOTAL KNEE ARTHROPLASTY;  Surgeon: Gaynelle Arabian, MD;  Location: [facility];  Service: Orthopedics;  Laterality: Right;  Adductor Block  . TRANSURETHRAL RESECTION OF PROSTATE N/A 08/08/2013   Procedure: TRANSURETHRAL RESECTION OF THE PROSTATE WITH GYRUS INSTRUMENTS;  Surgeon: Ailene Rud, MD;  Location: [facility];  Service: Urology;  Laterality: N/A;  . TRANSURETHRAL RESECTION OF PROSTATE         Home Medications    Prior to Admission medications   Medication Sig Start Date End Date Taking? Authorizing Provider  amLODipine (NORVASC) 2.5 MG tablet TAKE 1 TABLET BY MOUTH  DAILY 12/13/16  Yes Belva Crome, MD  amphetamine-dextroamphetamine (ADDERALL) 5 MG tablet Take 5 mg by mouth 2 (two) times daily.   Yes [provider]   methocarbamol (ROBAXIN) 500 MG tablet Take 1 tablet (500 mg total) by mouth every 6 (six) hours as needed for muscle spasms. 03/13/17  Yes Perkins, Alexzandrew L, PA-C  omeprazole (PRILOSEC) 20 MG capsule Take 20 mg by mouth daily.   Yes [provider]  oxyCODONE (OXY IR/ROXICODONE) 5 MG immediate release tablet Take 1-2 tablets (5-10 mg total) by mouth every 4 (four) hours as needed for moderate pain or severe pain. 03/13/17  Yes Perkins, Alexzandrew L, PA-C  rivaroxaban (XARELTO) 10 MG TABS tablet Take 1 tablet (10 mg total) by mouth daily with breakfast. Take Xarelto for two and a half more weeks following discharge from the hospital, then discontinue Xarelto. Once the patient has completed the Xarelto, they may resume the 81 mg Aspirin. 03/14/17  Yes Perkins, Alexzandrew L, PA-C  traMADol (ULTRAM) 50 MG tablet Take 1-2 tablets (50-100 mg total) by mouth every 6 (six) hours as needed (mild pain). 03/13/17  Yes Perkins, Alexzandrew L, PA-C  atorvastatin (LIPITOR) 20 MG tablet Take 20 mg by mouth daily. Pt takes every other day.    [provider]    Family History Family History  Problem Relation Age of Onset  . Obesity Brother   . Diabetes Brother   . Parkinsonism Brother     Social History Social History   Tobacco Use  . Smoking status: Former Smoker    Packs/day: 1.00    Years: 10.00    Pack years: 10.00    Types: Cigarettes    Last attempt to quit: 07/09/1984    Years since quitting: 32.7  . Smokeless tobacco: Never Used  Substance Use Topics  . Alcohol use: Yes    Alcohol/week: 1.0 oz    Types: 2 Standard drinks or equivalent per week    Comment: daily 1 per day  . Drug use: No     Allergies   Patient has no known allergies.   Review of Systems Review of Systems  Neurological: Positive for weakness.  All other systems reviewed and are negative.    Physical Exam Updated Vital Signs BP 114/68   Pulse 93   Temp 99.9 F (37.7 C) (Oral)   Resp (!)  22   Ht 5' 7.5" (1.715 m)   Wt 88.5 kg (195 lb)   SpO2 100%   BMI 30.09 kg/m   Physical Exam  Constitutional: He appears well-developed and well-nourished.  HENT:  Head: Normocephalic and atraumatic.  Right Ear: External ear normal.  Left Ear: External ear normal.  Nose: Nose normal.  Mouth/Throat: Mucous membranes are dry.  Chronic tongue fissures  Eyes: Conjunctivae and EOM are normal. Pupils are equal, round, and reactive to light.  Neck: Normal range of motion. Neck supple.  Cardiovascular: Regular rhythm, normal heart sounds and intact distal pulses. Tachycardia present.  Pulmonary/Chest:  Effort normal and breath sounds normal.  Abdominal: Soft. Bowel sounds are normal.  Musculoskeletal:  Incision site right knee looks good.  Mild swelling and bruising.  Neurological: He is alert.  Pt knows his name and the correct year.  Skin: Skin is warm. Capillary refill takes less than 2 seconds.  Psychiatric: He has a normal mood and affect. His behavior is normal. Judgment and thought content normal.  Nursing note and vitals reviewed.    ED Treatments / Results  Labs (all labs ordered are listed, but only abnormal results are displayed) Labs Reviewed  CBC WITH DIFFERENTIAL/PLATELET - Abnormal; Notable for the following components:      Result Value   WBC 28.7 (*)    RBC 4.17 (*)    Hemoglobin 12.8 (*)    HCT 37.1 (*)    Platelets 406 (*)    Neutro Abs 25.2 (*)    Monocytes Absolute 2.6 (*)    All other components within normal limits  COMPREHENSIVE METABOLIC PANEL - Abnormal; Notable for the following components:   Sodium 132 (*)    Chloride 98 (*)    Glucose, Bld 113 (*)    BUN 23 (*)    Calcium 8.5 (*)    Total Bilirubin 1.7 (*)    All other components within normal limits  URINALYSIS, ROUTINE W REFLEX MICROSCOPIC - Abnormal; Notable for the following components:   Hgb urine dipstick MODERATE (*)    Leukocytes, UA SMALL (*)    Bacteria, UA RARE (*)    All other  components within normal limits  PROTIME-INR - Abnormal; Notable for the following components:   Prothrombin Time 17.8 (*)    All other components within normal limits  CULTURE, BLOOD (ROUTINE X 2)  CULTURE, BLOOD (ROUTINE X 2)  URINE CULTURE  I-STAT TROPONIN, ED  CBG MONITORING, ED  I-STAT CG4 LACTIC ACID, ED    EKG  EKG Interpretation None       Radiology Dg Chest 2 View  Result Date: 03/20/2017 CLINICAL DATA:  Altered mental status. EXAM: CHEST  2 VIEW COMPARISON:  Chest x-ray dated December 31, 2003. FINDINGS: The heart size and mediastinal contours are within normal limits. Normal pulmonary vascularity. Mild bibasilar atelectasis. No focal consolidation, pleural effusion, or pneumothorax. No acute osseous abnormality. IMPRESSION: 1.  No active cardiopulmonary disease. Electronically Signed   By: Titus Dubin M.D.   On: 03/20/2017 16:06   Ct Head Wo Contrast  Result Date: 03/20/2017 CLINICAL DATA:  Altered mental status EXAM: CT HEAD WITHOUT CONTRAST TECHNIQUE: Contiguous axial images were obtained from the base of the skull through the vertex without intravenous contrast. COMPARISON:  None. FINDINGS: Brain: No mass lesion, intraparenchymal hemorrhage or extra-axial collection. No evidence of acute cortical infarct. Brain parenchyma and CSF-containing spaces are normal for age. Vascular: No hyperdense vessel or unexpected calcification. Skull: Normal visualized skull base, calvarium and extracranial soft tissues. Sinuses/Orbits: No sinus fluid levels or advanced mucosal thickening. No mastoid effusion. Normal orbits. IMPRESSION: Normal aging brain. Electronically Signed   By: Ulyses Jarred M.D.   On: 03/20/2017 17:23    Procedures Procedures (including critical care time)  Medications Ordered in ED Medications  sodium chloride 0.9 % bolus 1,000 mL (0 mLs Intravenous Stopped 03/20/17 1740)  acetaminophen (TYLENOL) tablet 1,000 mg (1,000 mg Oral Given 03/20/17 1548)  sodium  chloride 0.9 % bolus 1,000 mL (0 mLs Intravenous Stopped 03/20/17 1843)  bacitracin 500 UNIT/GM ointment (  Given 03/20/17 1844)  Initial Impression / Assessment and Plan / ED Course  I have reviewed the triage vital signs and the nursing notes.  Pertinent labs & imaging results that were available during my care of the patient were reviewed by me and considered in my medical decision making (see chart for details).    Pt is feeling much better.  He is back to normal.  Pt does have an elevated WBC, but no source of infection.  I did send cultures.  Pt's knee is not red or inflamed, so I don't think it is that.  Pt knows to return if worse and f/u with pcp.  Final Clinical Impressions(s) / ED Diagnoses   Final diagnoses:  Transient alteration of awareness    ED Discharge Orders    None       Isla Pence, MD 03/20/17 1858

## 2017-03-20 NOTE — ED Triage Notes (Signed)
Patient presents with wife and neighbor stating pt had catheter removed this am and took a nap. When pt woke up was disorientated. Pt usually A&O x4. Pt alert to self today. Pt had catheter after knee replacement surgery. No recent falls or injuries. Wife reports taking pain medication and muscle relaxer since surgery. Pt denies any pain.

## 2017-03-20 NOTE — ED Provider Notes (Signed)
Patient placed in Quick Look pathway, seen and evaluated   Chief Complaint: altered mental status  HPI:   S/p R knee replacement 8 days ago. Family noticed disoriented since this afternoon upon waking, about 1.5hrs PTA. Went to bed with normal level of consciousness. Wife states unable to recall when surgery was done or surgeon who completed. Looked more tired this morning and did not want to get out of bed. Has been giving him PRN tramadol, oxycodone and muscle relaxer. Does not feel as though he is overmedicated. No injuries or falls. Since surgery, urinary catheter has been placed (changed at urology office yesterday) and began on Xarelto.  No previous neuro history of hx of similar symptoms in past.  ROS: altered level consciousness  Physical Exam:   Gen: No distress  Neuro: Awake and Alert  Skin: Warm    Focused Exam: alert and oriented to person, place but not time or current events. Cranial nerves appear grossly intact. Equal grip strengths bilaterally. No signs of head trauma. Lungs CTAB. RRR. Compression stockings noted in BLE. Appears drowsy, eyes mostly closed but alert and able to answer questions.   Initiation of care has begun. The patient has been counseled on the process, plan, and necessity for staying for the completion/evaluation, and the remainder of the medical screening examination    Delia Heady, PA-C 03/20/17 1536    Virgel Manifold, MD 03/20/17 [phone]

## 2017-03-21 DIAGNOSIS — M25661 Stiffness of right knee, not elsewhere classified: Secondary | ICD-10-CM | POA: Diagnosis not present

## 2017-03-22 ENCOUNTER — Emergency Department (HOSPITAL_COMMUNITY)
Admission: EM | Admit: 2017-03-22 | Discharge: 2017-03-22 | Disposition: A | Discharge status: Home / Self Care | Payer: Medicare Other | Attending: Emergency Medicine | Admitting: Emergency Medicine

## 2017-03-22 ENCOUNTER — Encounter (HOSPITAL_COMMUNITY): Payer: Self-pay | Admitting: Emergency Medicine

## 2017-03-22 ENCOUNTER — Telehealth (HOSPITAL_BASED_OUTPATIENT_CLINIC_OR_DEPARTMENT_OTHER): Payer: Self-pay | Admitting: *Deleted

## 2017-03-22 DIAGNOSIS — Z5321 Procedure and treatment not carried out due to patient leaving prior to being seen by health care provider: Secondary | ICD-10-CM | POA: Insufficient documentation

## 2017-03-22 DIAGNOSIS — R799 Abnormal finding of blood chemistry, unspecified: Secondary | ICD-10-CM | POA: Insufficient documentation

## 2017-03-22 LAB — BLOOD CULTURE ID PANEL (REFLEXED)

## 2017-03-22 NOTE — ED Notes (Signed)
Patient's family member unhappy with waiting out in the lobby. Explained to patient and family, they were triaged and would be called from the lobby whenever a room was available.

## 2017-03-22 NOTE — ED Notes (Signed)
Explained to pts Daughter that the pt would need to stay and wait for further evaluation by an MD as to the need for IV abx for the positive blood culture results they were notified of this AM. Daughter understood and stated she will wait patiently.

## 2017-03-23 DIAGNOSIS — M25661 Stiffness of right knee, not elsewhere classified: Secondary | ICD-10-CM | POA: Diagnosis not present

## 2017-03-23 LAB — URINE CULTURE: Culture: >=100000 — AB

## 2017-03-24 ENCOUNTER — Telehealth: Payer: Self-pay

## 2017-03-24 NOTE — Progress Notes (Signed)
ED Antimicrobial Stewardship Positive Culture Follow Up   Cole Martinez is an 72 y.o. male who presented to [facility] on 03/22/2017 with a chief complaint of altered mental status. He had a R knee replacement 1 week prior to arrival. WBC 28.7. Of note, 1/2 blood cultures positive for gram variable rods.   Chief Complaint  Patient presents with  . Abnormal Lab    Recent Results (from the past 720 hour(s))  Surgical pcr screen     Status: Abnormal   Collection Time: 03/01/17  9:44 AM  Result Value Ref Range Status   MRSA, PCR NEGATIVE NEGATIVE Final   Staphylococcus aureus POSITIVE (A) NEGATIVE Final    Comment: (NOTE) The Xpert SA Assay (FDA approved for NASAL specimens in patients 62 years of age and older), is one component of a comprehensive surveillance program. It is not intended to diagnose infection nor to guide or monitor treatment.   Blood Culture (routine x 2)     Status: Abnormal (Preliminary result)   Collection Time: 03/20/17  3:40 PM  Result Value Ref Range Status   Specimen Description BLOOD RIGHT ANTECUBITAL  Final   Special Requests   Final    BOTTLES DRAWN AEROBIC AND ANAEROBIC Blood Culture adequate volume   Culture  Setup Time (A)  Final    GRAM VARIABLE ROD AEROBIC BOTTLE ONLY CRITICAL RESULT CALLED TO, READ BACK BY AND VERIFIED WITHCasandra Doffing RN [id number] 03/22/17 A BROWNING    Culture   Final    GRAM POSITIVE RODS IDENTIFICATION TO FOLLOW Performed at [facility], [place] [address] [place], [place] [address]    Report Status PENDING  Incomplete  Blood Culture (routine x 2)     Status: None (Preliminary result)   Collection Time: 03/20/17  3:40 PM  Result Value Ref Range Status   Specimen Description BLOOD LEFT ANTECUBITAL  Final   Special Requests   Final    BOTTLES DRAWN AEROBIC AND ANAEROBIC Blood Culture adequate volume   Culture   Final    NO GROWTH 3 DAYS Performed at [facility], [place] [address], [place], [place] [address]    Report  Status PENDING  Incomplete  Blood Culture ID Panel (Reflexed)     Status: None   Collection Time: 03/20/17  3:40 PM  Result Value Ref Range Status   Enterococcus species NOT DETECTED NOT DETECTED Final   Listeria monocytogenes NOT DETECTED NOT DETECTED Final   Staphylococcus species NOT DETECTED NOT DETECTED Final   Staphylococcus aureus NOT DETECTED NOT DETECTED Final   Streptococcus species NOT DETECTED NOT DETECTED Final   Streptococcus agalactiae NOT DETECTED NOT DETECTED Final   Streptococcus pneumoniae NOT DETECTED NOT DETECTED Final   Streptococcus pyogenes NOT DETECTED NOT DETECTED Final   Acinetobacter baumannii NOT DETECTED NOT DETECTED Final   Enterobacteriaceae species NOT DETECTED NOT DETECTED Final   Enterobacter cloacae complex NOT DETECTED NOT DETECTED Final   Escherichia coli NOT DETECTED NOT DETECTED Final   Klebsiella oxytoca NOT DETECTED NOT DETECTED Final   Klebsiella pneumoniae NOT DETECTED NOT DETECTED Final   Proteus species NOT DETECTED NOT DETECTED Final   Serratia marcescens NOT DETECTED NOT DETECTED Final   Haemophilus influenzae NOT DETECTED NOT DETECTED Final   Neisseria meningitidis NOT DETECTED NOT DETECTED Final   Pseudomonas aeruginosa NOT DETECTED NOT DETECTED Final   Candida albicans NOT DETECTED NOT DETECTED Final   Candida glabrata NOT DETECTED NOT DETECTED Final   Candida krusei NOT  DETECTED NOT DETECTED Final   Candida parapsilosis NOT DETECTED NOT DETECTED Final   Candida tropicalis NOT DETECTED NOT DETECTED Final    Comment: Performed at [facility], [place] [address], [place], [place] [address]  Urine culture     Status: Abnormal   Collection Time: 03/20/17  5:40 PM  Result Value Ref Range Status   Specimen Description URINE, CLEAN CATCH  Final   Special Requests NONE  Final   Culture >=100,000 COLONIES/mL PSEUDOMONAS AERUGINOSA (A)  Final   Report Status 03/23/2017 FINAL  Final   Organism ID, Bacteria PSEUDOMONAS AERUGINOSA (A)   Final      Susceptibility   Pseudomonas aeruginosa - MIC*    CEFTAZIDIME 2 SENSITIVE Sensitive     CIPROFLOXACIN <=0.25 SENSITIVE Sensitive     GENTAMICIN <=1 SENSITIVE Sensitive     IMIPENEM >=16 RESISTANT Resistant     PIP/TAZO 8 SENSITIVE Sensitive     CEFEPIME 2 SENSITIVE Sensitive     * >=100,000 COLONIES/mL PSEUDOMONAS AERUGINOSA     [x]  Patient discharged originally without antimicrobial agent and treatment is now indicated  Patient placement instructed to contact patient and ask that he returns to the ED for repeat blood cultures and antibiotics.   ED Provider: Delia Heady, PA-C   Albertina Parr, PharmD., BCPS Clinical Pharmacist Pager [phone]

## 2017-03-24 NOTE — Telephone Encounter (Signed)
Spoke with pts daughter regarding need to return to ED for repeat [facility]. Stated they had already been told to return and they did and waiting for over 3 hours without being seen. She called Ortho MD who ordered Abx and they returned home.  Did not want to return today because the pt could not wait that long. Suggested f/u with [facility] primary to have BC repeated on Monday and to F/u on Tues with Ortho MD. Information shared with Delia Heady PA

## 2017-03-25 LAB — CULTURE, BLOOD (ROUTINE X 2)
Culture: NO GROWTH
Special Requests: ADEQUATE
Special Requests: ADEQUATE

## 2017-03-26 ENCOUNTER — Telehealth: Payer: Self-pay | Admitting: Emergency Medicine

## 2017-03-26 DIAGNOSIS — M25661 Stiffness of right knee, not elsewhere classified: Secondary | ICD-10-CM | POA: Diagnosis not present

## 2017-03-26 NOTE — Telephone Encounter (Signed)
Post ED Visit - Positive Culture Follow-up  Culture report reviewed by antimicrobial stewardship pharmacist:  [x]  Elenor Quinones, Pharm.D. []  Heide Guile, Pharm.D., BCPS AQ-ID []  Parks Neptune, Pharm.D., BCPS []  Alycia Rossetti, Pharm.D., BCPS []  [place], Pharm.D., BCPS, AAHIVP []  Legrand Como, Pharm.D., BCPS, AAHIVP []  Salome Arnt, PharmD, BCPS []  Jalene Mullet, PharmD []  Vincenza Hews, PharmD, BCPS  Positive blood culture Treated with none, could be contaminant to f/u with [facility] PCP, no further patient follow-up is required at this time.  Hazle Nordmann 03/26/2017, 1:31 PM

## 2017-03-28 DIAGNOSIS — M25661 Stiffness of right knee, not elsewhere classified: Secondary | ICD-10-CM | POA: Diagnosis not present

## 2017-03-29 DIAGNOSIS — N390 Urinary tract infection, site not specified: Secondary | ICD-10-CM | POA: Diagnosis not present

## 2017-03-29 DIAGNOSIS — B965 Pseudomonas (aeruginosa) (mallei) (pseudomallei) as the cause of diseases classified elsewhere: Secondary | ICD-10-CM | POA: Diagnosis not present

## 2017-03-29 DIAGNOSIS — Z96659 Presence of unspecified artificial knee joint: Secondary | ICD-10-CM | POA: Diagnosis not present

## 2017-03-29 DIAGNOSIS — R350 Frequency of micturition: Secondary | ICD-10-CM | POA: Diagnosis not present

## 2017-03-29 DIAGNOSIS — M109 Gout, unspecified: Secondary | ICD-10-CM | POA: Diagnosis not present

## 2017-03-30 DIAGNOSIS — M25661 Stiffness of right knee, not elsewhere classified: Secondary | ICD-10-CM | POA: Diagnosis not present

## 2017-04-02 DIAGNOSIS — M25661 Stiffness of right knee, not elsewhere classified: Secondary | ICD-10-CM | POA: Diagnosis not present

## 2017-04-05 DIAGNOSIS — M25661 Stiffness of right knee, not elsewhere classified: Secondary | ICD-10-CM | POA: Diagnosis not present

## 2017-04-09 DIAGNOSIS — M25661 Stiffness of right knee, not elsewhere classified: Secondary | ICD-10-CM | POA: Diagnosis not present

## 2017-04-11 ENCOUNTER — Ambulatory Visit (INDEPENDENT_AMBULATORY_CARE_PROVIDER_SITE_OTHER): Payer: Medicare Other | Admitting: Neurology

## 2017-04-11 ENCOUNTER — Encounter: Payer: Self-pay | Admitting: Neurology

## 2017-04-11 VITALS — BP 145/84 | HR 89 | Wt 181.5 lb

## 2017-04-11 DIAGNOSIS — G250 Essential tremor: Secondary | ICD-10-CM | POA: Diagnosis not present

## 2017-04-11 NOTE — Progress Notes (Signed)
Reason for visit: Tremor  Cole Martinez is an 72 y.o. male  History of present illness:  Cole Martinez is a 72 year old right-handed white male with a history of a tremor that mainly affects the left upper extremity.  He has had a tremor that has gradually worsened since 2007.  He indicates that the tremor is mainly a resting tremor, it is quite prominent when he is holding something in his hand, he may have difficulty managing a mouse on the computer.  The right hand does have a tremor but it is not as significant.  He has also had a head nod tremor that he has noted at times when he gets excited.  He does take Adderall.  The patient has not had any alteration in his ability to ambulate or any difficulty with speech or swallowing.  He does not have much trouble feeding himself.  He does note some changes in handwriting.  He does have a family history of tremor.  He returns for an evaluation.  Past Medical History:  Diagnosis Date  . ADHD (attention deficit hyperactivity disorder)   . B12 deficiency   . BPH (benign prostatic hypertrophy)   . 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 Coronary artery disease CARDIOLOGIST- DR Daneen Schick   S/P STENTING LAD 1999  . Crohn's disease of ileum ([place]) SINCE 1988  . ED (erectile dysfunction)   . Elevated PSA   . Essential and other specified forms of tremor 11/25/2012  . H/O adenomatous polyp of colon   . Hyperlipidemia   . Nocturia   . OA (osteoarthritis)   . Peripheral neuropathy    hx of, none current as of 08-04-13  . Peyronie disease   . S/P coronary artery stent placement OCT 1999 OF LAD  . Tremor, hereditary, benign MILD RIGHT HAND    Past Surgical History:  Procedure Laterality Date  . CARDIOVASCULAR STRESS TEST  11-01-2010 DR Daneen Schick   NORMAL PERFUSION STUDY/ EF 64%/ NO ISCHEMIA  . COLONOSCOPY WITH PROPOFOL N/A 09/24/2012   Procedure: COLONOSCOPY WITH PROPOFOL;  Surgeon: [name], MD;  Location: [facility];  Service: Endoscopy;  Laterality: N/A;  . CORONARY ANGIOPLASTY WITH STENT PLACEMENT  OCT 1999   STENT OF LAD  . CYSTOSCOPY WITH URETHRAL DILATATION  03/12/2017   Procedure: CYSTOSCOPY WITH URETHRAL DILATATION;  Surgeon: Gaynelle Arabian, MD;  Location: [facility];  Service: Orthopedics;;  Ammie Dalton, Resident Assisting  . LAPAROSCOPIC INGUINAL HERNIA REPAIR Bilateral 01-10-2004   W/ MESH  . neck benign removed from neck  yrs ago  . PROSTATE BIOPSY N/A 07/12/2012   Procedure: PROSTATE BIOPSY AND ULTRASOUND;  Surgeon: Ailene Rud, MD;  Location: [facility];  Service: Urology;  Laterality: N/A;  . PROSTATE SURGERY  2002   tuna  . REMOVAL LEFT NECK LYMPH NODE  1996  . TONSILLECTOMY  CHILD  . TOTAL KNEE ARTHROPLASTY Right 03/12/2017   Procedure: RIGHT TOTAL KNEE ARTHROPLASTY;  Surgeon: Gaynelle Arabian, MD;  Location: [facility];  Service: Orthopedics;  Laterality: Right;  Adductor Block  . TRANSURETHRAL RESECTION OF PROSTATE N/A 08/08/2013   Procedure: TRANSURETHRAL RESECTION OF THE PROSTATE WITH GYRUS INSTRUMENTS;  Surgeon: Ailene Rud, MD;  Location: [facility];  Service: Urology;  Laterality: N/A;  . TRANSURETHRAL RESECTION OF PROSTATE      Family History  Problem Relation Age of  Onset  . Obesity Brother   . Diabetes Brother   . Parkinsonism Brother     Social history:  reports that he quit smoking about 32 years ago. His smoking use included cigarettes. He has a 10.00 pack-year smoking history. he has never used smokeless tobacco. He reports that he drinks about 1.0 oz of alcohol per week. He reports that he does not use drugs.   No Known Allergies  Medications:  Prior to Admission medications   Medication Sig Start Date End Date Taking? Authorizing Provider  amLODipine (NORVASC) 2.5 MG tablet TAKE 1 TABLET BY MOUTH  DAILY 12/13/16  Yes Belva Crome, MD    amphetamine-dextroamphetamine (ADDERALL) 5 MG tablet Take 5 mg by mouth 2 (two) times daily.   Yes [provider]  atorvastatin (LIPITOR) 20 MG tablet Take 20 mg by mouth daily. Pt takes every other day.   Yes [provider]  Cholecalciferol (VITAMIN D3 PO) Take 1 tablet by mouth daily.   Yes [provider]  Coenzyme Q10 (CO Q-10) 200 MG CAPS Take 1 capsule by mouth daily.   Yes [provider]  colchicine 0.6 MG tablet Take 0.6 mg by mouth daily.   Yes [provider]  Cyanocobalamin (VITAMIN B-12 PO) Take 2,500 mcg by mouth daily.   Yes [provider]  omeprazole (PRILOSEC) 20 MG capsule Take 20 mg by mouth daily.   Yes [provider]  VITAMIN E PO Take 1 tablet by mouth daily.   Yes [provider]    ROS:  Out of a complete 14 system review of symptoms, the patient complains only of the following symptoms, and all other reviewed systems are negative.  Runny nose Frequency of urination Frequent waking Tremors  Blood pressure (!) 145/84, pulse 89, weight 181 lb 8 oz (82.3 kg).  Physical Exam  General: The patient is alert and cooperative at the time of the examination.  Skin: No significant peripheral edema is noted.   Neurologic Exam  Mental status: The patient is alert and oriented x 3 at the time of the examination. The patient has apparent normal recent and remote memory, with an apparently normal attention span and concentration ability.   Cranial nerves: Facial symmetry is present. Speech is normal, no aphasia or dysarthria is noted. Extraocular movements are full. Visual fields are full.  Motor: The patient has good strength in all 4 extremities.  Sensory examination: Soft touch sensation is symmetric on the face, arms, and legs.  Coordination: The patient has good finger-nose-finger and heel-to-shin bilaterally.  A prominent resting tremor seen with the left upper extremity.  There is  minimal tremor in both arms with the arms outstretched.  When drawing a spiral, there is very little tremor transmitted into the handwriting.  Gait and station: The patient has a slightly wide-based gait.  The patient has good arm swing with walking, the left upper extremity tremor is noted with walking.  Romberg is negative. No drift is seen.  Reflexes: Deep tendon reflexes are symmetric.   CT brain 03/20/17:  IMPRESSION: Normal aging brain.   Assessment/Plan:  1.  Essential tremor  The features of the tremor would be consistent with Parkinson's disease, but the patient does not have any other features of parkinsonism.  The patient has a true resting tremor of the left upper extremity.  The tremor is present with walking.  The patient has good arm swing, he has normal facial expression.  The patient does have a  family history of tremor.  He has undergone a recent CT scan of the brain that was unremarkable.  He does not wish to have any medical therapy for the tremor.  He will follow-up in 1 year.  The patient has had a tremor since 2007.  Jill Alexanders MD 04/11/2017 9:14 AM  [facility] [address] [place], [place] [address]  Phone [phone] Fax [phone]

## 2017-04-12 DIAGNOSIS — M25661 Stiffness of right knee, not elsewhere classified: Secondary | ICD-10-CM | POA: Diagnosis not present

## 2017-04-16 DIAGNOSIS — M25661 Stiffness of right knee, not elsewhere classified: Secondary | ICD-10-CM | POA: Diagnosis not present

## 2017-04-17 DIAGNOSIS — Z96659 Presence of unspecified artificial knee joint: Secondary | ICD-10-CM | POA: Diagnosis not present

## 2017-04-17 DIAGNOSIS — M25661 Stiffness of right knee, not elsewhere classified: Secondary | ICD-10-CM | POA: Diagnosis not present

## 2017-04-17 DIAGNOSIS — Z96651 Presence of right artificial knee joint: Secondary | ICD-10-CM | POA: Diagnosis not present

## 2017-04-17 DIAGNOSIS — Z471 Aftercare following joint replacement surgery: Secondary | ICD-10-CM | POA: Diagnosis not present

## 2017-04-18 DIAGNOSIS — M25661 Stiffness of right knee, not elsewhere classified: Secondary | ICD-10-CM | POA: Diagnosis not present

## 2017-04-23 DIAGNOSIS — M25661 Stiffness of right knee, not elsewhere classified: Secondary | ICD-10-CM | POA: Diagnosis not present

## 2017-04-26 DIAGNOSIS — M25661 Stiffness of right knee, not elsewhere classified: Secondary | ICD-10-CM | POA: Diagnosis not present

## 2017-05-09 DIAGNOSIS — G629 Polyneuropathy, unspecified: Secondary | ICD-10-CM | POA: Diagnosis not present

## 2017-05-17 DIAGNOSIS — M8580 Other specified disorders of bone density and structure, unspecified site: Secondary | ICD-10-CM | POA: Diagnosis not present

## 2017-05-17 DIAGNOSIS — Z125 Encounter for screening for malignant neoplasm of prostate: Secondary | ICD-10-CM | POA: Diagnosis not present

## 2017-05-17 DIAGNOSIS — N529 Male erectile dysfunction, unspecified: Secondary | ICD-10-CM | POA: Diagnosis not present

## 2017-05-17 DIAGNOSIS — R5383 Other fatigue: Secondary | ICD-10-CM | POA: Diagnosis not present

## 2017-05-17 DIAGNOSIS — R7303 Prediabetes: Secondary | ICD-10-CM | POA: Diagnosis not present

## 2017-05-17 DIAGNOSIS — R12 Heartburn: Secondary | ICD-10-CM | POA: Diagnosis not present

## 2017-05-17 DIAGNOSIS — Z833 Family history of diabetes mellitus: Secondary | ICD-10-CM | POA: Diagnosis not present

## 2017-05-17 DIAGNOSIS — E230 Hypopituitarism: Secondary | ICD-10-CM | POA: Diagnosis not present

## 2017-05-17 DIAGNOSIS — R6882 Decreased libido: Secondary | ICD-10-CM | POA: Diagnosis not present

## 2017-05-22 DIAGNOSIS — R351 Nocturia: Secondary | ICD-10-CM | POA: Diagnosis not present

## 2017-05-22 DIAGNOSIS — G629 Polyneuropathy, unspecified: Secondary | ICD-10-CM | POA: Diagnosis not present

## 2017-05-22 DIAGNOSIS — Z833 Family history of diabetes mellitus: Secondary | ICD-10-CM | POA: Diagnosis not present

## 2017-05-22 DIAGNOSIS — R7303 Prediabetes: Secondary | ICD-10-CM | POA: Diagnosis not present

## 2017-06-03 ENCOUNTER — Other Ambulatory Visit: Payer: Self-pay | Admitting: Interventional Cardiology

## 2017-07-31 DIAGNOSIS — H2513 Age-related nuclear cataract, bilateral: Secondary | ICD-10-CM | POA: Diagnosis not present

## 2017-07-31 DIAGNOSIS — H25013 Cortical age-related cataract, bilateral: Secondary | ICD-10-CM | POA: Diagnosis not present

## 2017-08-08 ENCOUNTER — Other Ambulatory Visit: Payer: Self-pay | Admitting: Dermatology

## 2017-08-08 DIAGNOSIS — L821 Other seborrheic keratosis: Secondary | ICD-10-CM | POA: Diagnosis not present

## 2017-08-08 DIAGNOSIS — L570 Actinic keratosis: Secondary | ICD-10-CM | POA: Diagnosis not present

## 2017-08-08 DIAGNOSIS — D485 Neoplasm of uncertain behavior of skin: Secondary | ICD-10-CM | POA: Diagnosis not present

## 2017-08-08 DIAGNOSIS — D229 Melanocytic nevi, unspecified: Secondary | ICD-10-CM | POA: Diagnosis not present

## 2017-08-14 ENCOUNTER — Other Ambulatory Visit: Payer: Self-pay

## 2017-08-14 ENCOUNTER — Ambulatory Visit (INDEPENDENT_AMBULATORY_CARE_PROVIDER_SITE_OTHER): Payer: Medicare Other | Admitting: Podiatry

## 2017-08-14 ENCOUNTER — Encounter: Payer: Self-pay | Admitting: Podiatry

## 2017-08-14 DIAGNOSIS — B070 Plantar wart: Secondary | ICD-10-CM | POA: Diagnosis not present

## 2017-08-14 DIAGNOSIS — Q828 Other specified congenital malformations of skin: Secondary | ICD-10-CM

## 2017-08-14 NOTE — Progress Notes (Signed)
Subjective:  Patient ID: Cole Martinez, male    DOB: 08-11-1945,  MRN: [id number] HPI Chief Complaint  Patient presents with  . Callouses    left foot ball of foot; callous trim, corn on 2nd toe; pt stated, "think i have a wart in between toes 4-5 on right foot, noticed a few months ago"    72 y.o. male presents with the above complaint.   ROS: Denies fever chills nausea vomiting muscle aches pains calf pain back pain chest pain shortness of breath and headache.  Past Medical History:  Diagnosis Date  . ADHD (attention deficit hyperactivity disorder)   . B12 deficiency   . BPH (benign prostatic hypertrophy)   . 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 Coronary artery disease CARDIOLOGIST- DR Daneen Schick   S/P STENTING LAD 1999  . Crohn's disease of ileum ([place]) SINCE 1988  . ED (erectile dysfunction)   . Elevated PSA   . Essential and other specified forms of tremor 11/25/2012  . H/O adenomatous polyp of colon   . Hyperlipidemia   . Nocturia   . OA (osteoarthritis)   . Peripheral neuropathy    hx of, none current as of 08-04-13  . Peyronie disease   . S/P coronary artery stent placement OCT 1999 OF LAD  . Tremor, hereditary, benign MILD RIGHT HAND   Past Surgical History:  Procedure Laterality Date  . CARDIOVASCULAR STRESS TEST  11-01-2010 DR Daneen Schick   NORMAL PERFUSION STUDY/ EF 64%/ NO ISCHEMIA  . COLONOSCOPY WITH PROPOFOL N/A 09/24/2012   Procedure: COLONOSCOPY WITH PROPOFOL;  Surgeon: [name], MD;  Location: [facility];  Service: Endoscopy;  Laterality: N/A;  . CORONARY ANGIOPLASTY WITH STENT PLACEMENT  OCT 1999   STENT OF LAD  . CYSTOSCOPY WITH URETHRAL DILATATION  03/12/2017   Procedure: CYSTOSCOPY WITH URETHRAL DILATATION;  Surgeon: Gaynelle Arabian, MD;  Location: [facility];  Service: Orthopedics;;  Ammie Dalton, Resident Assisting  . LAPAROSCOPIC INGUINAL  HERNIA REPAIR Bilateral 01-10-2004   W/ MESH  . neck benign removed from neck  yrs ago  . PROSTATE BIOPSY N/A 07/12/2012   Procedure: PROSTATE BIOPSY AND ULTRASOUND;  Surgeon: Ailene Rud, MD;  Location: [facility];  Service: Urology;  Laterality: N/A;  . PROSTATE SURGERY  2002   tuna  . REMOVAL LEFT NECK LYMPH NODE  1996  . TONSILLECTOMY  CHILD  . TOTAL KNEE ARTHROPLASTY Right 03/12/2017   Procedure: RIGHT TOTAL KNEE ARTHROPLASTY;  Surgeon: Gaynelle Arabian, MD;  Location: [facility];  Service: Orthopedics;  Laterality: Right;  Adductor Block  . TRANSURETHRAL RESECTION OF PROSTATE N/A 08/08/2013   Procedure: TRANSURETHRAL RESECTION OF THE PROSTATE WITH GYRUS INSTRUMENTS;  Surgeon: Ailene Rud, MD;  Location: [facility];  Service: Urology;  Laterality: N/A;  . TRANSURETHRAL RESECTION OF PROSTATE      Current Outpatient Medications:  .  amitriptyline (ELAVIL) 25 MG tablet, amitriptyline 25 mg tablet, Disp: , Rfl:  .  amLODipine (NORVASC) 2.5 MG tablet, TAKE 1 TABLET BY MOUTH  DAILY, Disp: 90 tablet, Rfl: 3 .  amphetamine-dextroamphetamine (ADDERALL) 5 MG tablet, Take 5 mg by mouth 2 (two) times daily., Disp: , Rfl:  .  aspirin 81 MG tablet, Aspir-81, Disp: , Rfl:  .  atorvastatin (LIPITOR) 20 MG tablet, Take 1 tablet (20 mg total) by mouth every morning., Disp: 90 tablet, Rfl: 1 .  celecoxib (CELEBREX) 200 MG capsule, , Disp: , Rfl:  .  Cholecalciferol (VITAMIN D3 PO), Take 1 tablet by mouth daily., Disp: , Rfl:  .  ciprofloxacin (CIPRO) 500 MG tablet, ciprofloxacin 500 mg tablet, Disp: , Rfl:  .  Coenzyme Q10 (CO Q-10) 200 MG CAPS, Take 1 capsule by mouth daily., Disp: , Rfl:  .  colchicine 0.6 MG tablet, Take 0.6 mg by mouth daily., Disp: , Rfl:  .  Cyanocobalamin (VITAMIN B-12 PO), Take 2,500 mcg by mouth daily., Disp: , Rfl:  .  omeprazole (PRILOSEC) 20 MG capsule, Take 20 mg by mouth daily., Disp: , Rfl:  .  oxycodone-acetaminophen (LYNOX) 5-300 MG tablet, oxycodone  5 mg tablet, Disp: , Rfl:  .  rivaroxaban (XARELTO) 10 MG TABS tablet, Xarelto 10 mg tablet, Disp: , Rfl:  .  traMADol (ULTRAM) 50 MG tablet, tramadol 50 mg tablet, Disp: , Rfl:  .  VITAMIN E PO, Take 1 tablet by mouth daily., Disp: , Rfl:   No Known Allergies Review of Systems Objective:  There were no vitals filed for this visit.  General: Well developed, nourished, in no acute distress, alert and oriented x3   Dermatological: Skin is warm, dry and supple bilateral. Nails x 10 are well maintained; remaining integument appears unremarkable at this time. There are no open sores, no preulcerative lesions, no rash or signs of infection present.  Verrucoid lesion dorsal lateral aspect PIPJ region fourth toe right foot left foot demonstrates hammertoe deformities with resultant poor keratomas plantar aspect second metatarsal phalangeal joint.  Vascular: Dorsalis Pedis artery and Posterior Tibial artery pedal pulses are 2/4 bilateral with immedate capillary fill time. Pedal hair growth present. No varicosities and no lower extremity edema present bilateral.   Neruologic: Grossly intact via light touch bilateral. Vibratory intact via tuning fork bilateral. Protective threshold with Semmes Wienstein monofilament intact to all pedal sites bilateral. Patellar and Achilles deep tendon reflexes 2+ bilateral. No Babinski or clonus noted bilateral.   Musculoskeletal: No gross boney pedal deformities bilateral. No pain, crepitus, or limitation noted with foot and ankle range of motion bilateral. Muscular strength 5/5 in all groups tested bilateral.  Gait: Unassisted, Nonantalgic.    Radiographs:  None taken  Assessment & Plan:   Assessment: Porokeratosis plantar aspect second metatarsal left foot.  Wart fourth digit right foot dorsally  Plan: Chemical destruction of the wart with Cantharone under occlusion to be washed off thoroughly tomorrow also debrided the reactive hyperkeratosis plantar aspect  of the second metatarsal of the left foot.     Max T. [place], [place]

## 2017-08-22 DIAGNOSIS — N400 Enlarged prostate without lower urinary tract symptoms: Secondary | ICD-10-CM | POA: Diagnosis not present

## 2017-08-22 DIAGNOSIS — N5201 Erectile dysfunction due to arterial insufficiency: Secondary | ICD-10-CM | POA: Diagnosis not present

## 2017-08-22 DIAGNOSIS — E538 Deficiency of other specified B group vitamins: Secondary | ICD-10-CM | POA: Diagnosis not present

## 2017-08-22 DIAGNOSIS — Z0000 Encounter for general adult medical examination without abnormal findings: Secondary | ICD-10-CM | POA: Diagnosis not present

## 2017-08-22 DIAGNOSIS — E559 Vitamin D deficiency, unspecified: Secondary | ICD-10-CM | POA: Diagnosis not present

## 2017-08-22 DIAGNOSIS — N401 Enlarged prostate with lower urinary tract symptoms: Secondary | ICD-10-CM | POA: Diagnosis not present

## 2017-08-22 DIAGNOSIS — M109 Gout, unspecified: Secondary | ICD-10-CM | POA: Diagnosis not present

## 2017-08-22 DIAGNOSIS — Z1211 Encounter for screening for malignant neoplasm of colon: Secondary | ICD-10-CM | POA: Diagnosis not present

## 2017-08-22 DIAGNOSIS — Z79899 Other long term (current) drug therapy: Secondary | ICD-10-CM | POA: Diagnosis not present

## 2017-08-22 DIAGNOSIS — I2510 Atherosclerotic heart disease of native coronary artery without angina pectoris: Secondary | ICD-10-CM | POA: Diagnosis not present

## 2017-08-22 DIAGNOSIS — Z1389 Encounter for screening for other disorder: Secondary | ICD-10-CM | POA: Diagnosis not present

## 2017-08-22 DIAGNOSIS — N35011 Post-traumatic bulbous urethral stricture: Secondary | ICD-10-CM | POA: Diagnosis not present

## 2017-08-22 DIAGNOSIS — E038 Other specified hypothyroidism: Secondary | ICD-10-CM | POA: Diagnosis not present

## 2017-08-22 DIAGNOSIS — N529 Male erectile dysfunction, unspecified: Secondary | ICD-10-CM | POA: Diagnosis not present

## 2017-08-22 DIAGNOSIS — C9110 Chronic lymphocytic leukemia of B-cell type not having achieved remission: Secondary | ICD-10-CM | POA: Diagnosis not present

## 2017-08-22 DIAGNOSIS — I10 Essential (primary) hypertension: Secondary | ICD-10-CM | POA: Diagnosis not present

## 2017-08-22 DIAGNOSIS — E291 Testicular hypofunction: Secondary | ICD-10-CM | POA: Diagnosis not present

## 2017-08-22 DIAGNOSIS — R351 Nocturia: Secondary | ICD-10-CM | POA: Diagnosis not present

## 2017-09-10 DIAGNOSIS — N201 Calculus of ureter: Secondary | ICD-10-CM | POA: Diagnosis not present

## 2017-09-10 DIAGNOSIS — R310 Gross hematuria: Secondary | ICD-10-CM | POA: Diagnosis not present

## 2017-09-10 DIAGNOSIS — N200 Calculus of kidney: Secondary | ICD-10-CM | POA: Diagnosis not present

## 2017-09-17 ENCOUNTER — Other Ambulatory Visit: Payer: Self-pay | Admitting: Urology

## 2017-09-17 DIAGNOSIS — R310 Gross hematuria: Secondary | ICD-10-CM | POA: Diagnosis not present

## 2017-09-17 DIAGNOSIS — N201 Calculus of ureter: Secondary | ICD-10-CM | POA: Diagnosis not present

## 2017-09-18 ENCOUNTER — Encounter (HOSPITAL_COMMUNITY): Payer: Self-pay | Admitting: *Deleted

## 2017-09-18 DIAGNOSIS — N201 Calculus of ureter: Secondary | ICD-10-CM | POA: Diagnosis not present

## 2017-09-18 DIAGNOSIS — R310 Gross hematuria: Secondary | ICD-10-CM | POA: Diagnosis not present

## 2017-09-19 ENCOUNTER — Ambulatory Visit (HOSPITAL_COMMUNITY): Admission: RE | Admit: 2017-09-19 | Payer: Medicare Other | Source: Ambulatory Visit | Admitting: Urology

## 2017-09-19 ENCOUNTER — Encounter (HOSPITAL_COMMUNITY): Admission: RE | Payer: Self-pay | Source: Ambulatory Visit

## 2017-09-19 SURGERY — CYSTOSCOPY, WITH CALCULUS REMOVAL USING BASKET
Anesthesia: LOCAL | Laterality: Right

## 2017-11-19 ENCOUNTER — Telehealth: Payer: Self-pay | Admitting: Oncology

## 2017-11-19 NOTE — Telephone Encounter (Signed)
GBS PAL 9/26 - moved lab/fu to 10/4. Spoke with patient.

## 2017-11-29 ENCOUNTER — Ambulatory Visit: Payer: Medicare Other | Admitting: Oncology

## 2017-12-05 DIAGNOSIS — Z23 Encounter for immunization: Secondary | ICD-10-CM | POA: Diagnosis not present

## 2017-12-07 ENCOUNTER — Telehealth: Payer: Self-pay | Admitting: Oncology

## 2017-12-07 ENCOUNTER — Other Ambulatory Visit: Payer: Self-pay | Admitting: *Deleted

## 2017-12-07 ENCOUNTER — Inpatient Hospital Stay: Payer: Medicare Other | Attending: Oncology | Admitting: Oncology

## 2017-12-07 ENCOUNTER — Inpatient Hospital Stay: Payer: Medicare Other

## 2017-12-07 VITALS — BP 143/85 | HR 72 | Temp 97.9°F | Resp 18 | Ht 67.5 in | Wt 185.8 lb

## 2017-12-07 DIAGNOSIS — C9110 Chronic lymphocytic leukemia of B-cell type not having achieved remission: Secondary | ICD-10-CM | POA: Insufficient documentation

## 2017-12-07 DIAGNOSIS — Z96651 Presence of right artificial knee joint: Secondary | ICD-10-CM | POA: Insufficient documentation

## 2017-12-07 LAB — CBC WITH DIFFERENTIAL (CANCER CENTER ONLY)
Basophils Absolute: 0.1 10*3/uL (ref 0.0–0.1)
Basophils Relative: 1 %
Eosinophils Absolute: 0.2 10*3/uL (ref 0.0–0.5)
Eosinophils Relative: 3 %
HCT: 45.6 % (ref 38.4–49.9)
Hemoglobin: 15.2 g/dL (ref 13.0–17.1)
Lymphocytes Relative: 19 %
Lymphs Abs: 1.4 10*3/uL (ref 0.9–3.3)
MCH: 30.1 pg (ref 27.2–33.4)
MCHC: 33.3 g/dL (ref 32.0–36.0)
MCV: 90.4 fL (ref 79.3–98.0)
Monocytes Absolute: 0.7 10*3/uL (ref 0.1–0.9)
Monocytes Relative: 10 %
Neutro Abs: 5.1 10*3/uL (ref 1.5–6.5)
Neutrophils Relative %: 67 %
Platelet Count: 224 10*3/uL (ref 140–400)
RBC: 5.05 MIL/uL (ref 4.20–5.82)
RDW: 14.2 % (ref 11.0–14.6)
WBC Count: 7.4 10*3/uL (ref 4.0–10.3)

## 2017-12-07 NOTE — Telephone Encounter (Signed)
Appts scheduled AVS/Calendar printed per 10/4 los

## 2017-12-07 NOTE — Progress Notes (Signed)
  [facility] OFFICE PROGRESS NOTE   Diagnosis: CLL  INTERVAL HISTORY:   Mr. Cole Martinez returns as scheduled.  He underwent a right knee replacement in January.  He developed a Pseudomonas urinary tract infection following surgery.  He feels well at present.  Good appetite.  No other infection.  He received an influenza vaccine earlier this week.  Objective:  Vital signs in last 24 hours:  Blood pressure (!) 143/85, pulse 72, temperature 97.9 F (36.6 C), resp. rate 18, height 5' 7.5" (1.715 m), weight 185 lb 12.8 oz (84.3 kg), SpO2 99 %.    HEENT: Neck without mass Lymphatics: No cervical, supraclavicular, axillary, or inguinal nodes.  Prominent axillary fat pads Resp: Lungs clear bilaterally Cardiac: Regular rate and rhythm GI: No hepatosplenomegaly Vascular: Trace ankle edema bilaterally   Lab Results:  Lab Results  Component Value Date   WBC 7.4 12/07/2017   HGB 15.2 12/07/2017   HCT 45.6 12/07/2017   MCV 90.4 12/07/2017   PLT 224 12/07/2017   NEUTROABS 5.1 12/07/2017    CMP  Lab Results  Component Value Date   NA 132 (L) 03/20/2017   K 4.2 03/20/2017   CL 98 (L) 03/20/2017   CO2 25 03/20/2017   GLUCOSE 113 (H) 03/20/2017   BUN 23 (H) 03/20/2017   CREATININE 1.14 03/20/2017   CALCIUM 8.5 (L) 03/20/2017   PROT 7.3 03/20/2017   ALBUMIN 3.9 03/20/2017   AST 28 03/20/2017   ALT 29 03/20/2017   ALKPHOS 75 03/20/2017   BILITOT 1.7 (H) 03/20/2017   GFRNONAA >60 03/20/2017   GFRAA >60 03/20/2017    Medications: I have reviewed the patient's current medications.   Assessment/Plan: 1. Chronic lymphocytic leukemia, diagnosed in 1996. He remains asymptomatic and stable from a hematologic standpoint. 2. pneumococcal vaccine given on 11/30/2015 , 13 valent pneumonia vaccine given 11/13/2013    Disposition: Mr. Cole Martinez is stable from a hematologic standpoint.  He will return for an office visit and CBC in 1 year.  Betsy Coder, MD  12/07/2017    8:59 AM

## 2017-12-28 DIAGNOSIS — M25661 Stiffness of right knee, not elsewhere classified: Secondary | ICD-10-CM | POA: Diagnosis not present

## 2018-01-04 ENCOUNTER — Other Ambulatory Visit: Payer: Self-pay | Admitting: Interventional Cardiology

## 2018-01-16 ENCOUNTER — Other Ambulatory Visit: Payer: Self-pay | Admitting: Interventional Cardiology

## 2018-01-17 DIAGNOSIS — Z86010 Personal history of colonic polyps: Secondary | ICD-10-CM | POA: Diagnosis not present

## 2018-01-17 DIAGNOSIS — K5000 Crohn's disease of small intestine without complications: Secondary | ICD-10-CM | POA: Diagnosis not present

## 2018-01-17 DIAGNOSIS — K219 Gastro-esophageal reflux disease without esophagitis: Secondary | ICD-10-CM | POA: Diagnosis not present

## 2018-01-23 ENCOUNTER — Telehealth: Payer: Self-pay | Admitting: *Deleted

## 2018-01-23 NOTE — Telephone Encounter (Signed)
Voice mail asking if OK with Dr. Benay Spice for him to have shingles vaccine. Called back and left VM that OK for vaccine per MD>

## 2018-01-23 NOTE — Telephone Encounter (Signed)
Asking if OK to have shingles vaccine?

## 2018-02-12 DIAGNOSIS — H2513 Age-related nuclear cataract, bilateral: Secondary | ICD-10-CM | POA: Diagnosis not present

## 2018-02-12 DIAGNOSIS — H25013 Cortical age-related cataract, bilateral: Secondary | ICD-10-CM | POA: Diagnosis not present

## 2018-02-14 DIAGNOSIS — F900 Attention-deficit hyperactivity disorder, predominantly inattentive type: Secondary | ICD-10-CM | POA: Diagnosis not present

## 2018-02-14 DIAGNOSIS — I10 Essential (primary) hypertension: Secondary | ICD-10-CM | POA: Diagnosis not present

## 2018-02-14 DIAGNOSIS — C9110 Chronic lymphocytic leukemia of B-cell type not having achieved remission: Secondary | ICD-10-CM | POA: Diagnosis not present

## 2018-02-14 DIAGNOSIS — I2510 Atherosclerotic heart disease of native coronary artery without angina pectoris: Secondary | ICD-10-CM | POA: Diagnosis not present

## 2018-02-14 DIAGNOSIS — Z79899 Other long term (current) drug therapy: Secondary | ICD-10-CM | POA: Diagnosis not present

## 2018-02-22 DIAGNOSIS — Z86010 Personal history of colonic polyps: Secondary | ICD-10-CM | POA: Diagnosis not present

## 2018-02-22 DIAGNOSIS — K648 Other hemorrhoids: Secondary | ICD-10-CM | POA: Diagnosis not present

## 2018-02-22 DIAGNOSIS — K317 Polyp of stomach and duodenum: Secondary | ICD-10-CM | POA: Diagnosis not present

## 2018-02-22 DIAGNOSIS — Z1211 Encounter for screening for malignant neoplasm of colon: Secondary | ICD-10-CM | POA: Diagnosis not present

## 2018-02-22 DIAGNOSIS — K635 Polyp of colon: Secondary | ICD-10-CM | POA: Diagnosis not present

## 2018-02-22 DIAGNOSIS — K2930 Chronic superficial gastritis without bleeding: Secondary | ICD-10-CM | POA: Diagnosis not present

## 2018-02-22 DIAGNOSIS — K644 Residual hemorrhoidal skin tags: Secondary | ICD-10-CM | POA: Diagnosis not present

## 2018-02-22 DIAGNOSIS — D120 Benign neoplasm of cecum: Secondary | ICD-10-CM | POA: Diagnosis not present

## 2018-02-22 DIAGNOSIS — F458 Other somatoform disorders: Secondary | ICD-10-CM | POA: Diagnosis not present

## 2018-03-05 DIAGNOSIS — K317 Polyp of stomach and duodenum: Secondary | ICD-10-CM | POA: Diagnosis not present

## 2018-03-05 DIAGNOSIS — K635 Polyp of colon: Secondary | ICD-10-CM | POA: Diagnosis not present

## 2018-03-05 DIAGNOSIS — K2930 Chronic superficial gastritis without bleeding: Secondary | ICD-10-CM | POA: Diagnosis not present

## 2018-03-11 ENCOUNTER — Other Ambulatory Visit: Payer: Self-pay | Admitting: Interventional Cardiology

## 2018-03-11 MED ORDER — AMLODIPINE BESYLATE 2.5 MG PO TABS: 2.5000 mg | ORAL_TABLET | Freq: Every day | ORAL | 0 refills | Status: DC

## 2018-03-14 ENCOUNTER — Other Ambulatory Visit: Payer: Self-pay | Admitting: Gastroenterology

## 2018-03-14 DIAGNOSIS — K5730 Diverticulosis of large intestine without perforation or abscess without bleeding: Secondary | ICD-10-CM

## 2018-03-14 DIAGNOSIS — Z86010 Personal history of colonic polyps: Secondary | ICD-10-CM

## 2018-03-15 DIAGNOSIS — J069 Acute upper respiratory infection, unspecified: Secondary | ICD-10-CM | POA: Diagnosis not present

## 2018-03-17 NOTE — Progress Notes (Signed)
Cardiology Office Note:    Date:  03/18/2018   ID:  Cole Martinez, DOB November 10, 1945, MRN [id number]  PCP:  Josetta Huddle, MD  Cardiologist:  Sinclair Grooms, MD   Referring MD: Josetta Huddle, MD   Chief Complaint  Patient presents with  . Coronary Artery Disease    History of Present Illness:    Cole Martinez is a 73 y.o. male with a hx of CAD with bare-metal stent in LAD, hyperlipidemia, elevated BP but not diagnosed hypertension, and obesity.  Cole Martinez is doing well.  Since right knee replacement last January, he has done well.  Function has significantly improved.  He goes to the gym 5 times per week.  He denies limitations related to knee discomfort, syncope, and shortness of breath.  Denies angina or nitroglycerin use.  Myocardial perfusion imaging done prior to knee replacement surgery did not reveal evidence of ischemia in October 2018.  He went through rigorous rehab related to his right knee without any symptoms of angina in the interval.  Past Medical History:  Diagnosis Date  . ADHD (attention deficit hyperactivity disorder)   . B12 deficiency   . BPH (benign prostatic hypertrophy)   . 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 Coronary artery disease CARDIOLOGIST- DR Daneen Schick   S/P STENTING LAD 1999  . Crohn's disease of ileum ([place]) SINCE 1988  . ED (erectile dysfunction)   . Elevated PSA   . Essential and other specified forms of tremor 11/25/2012  . H/O adenomatous polyp of colon   . Hyperlipidemia   . Nocturia   . OA (osteoarthritis)   . Peripheral neuropathy    hx of, none current as of 08-04-13  . Peyronie disease   . S/P coronary artery stent placement OCT 1999 OF LAD  . Tremor, hereditary, benign MILD RIGHT HAND    Past Surgical History:  Procedure Laterality Date  . CARDIOVASCULAR STRESS TEST  11-01-2010 DR Daneen Schick   NORMAL PERFUSION STUDY/ EF  64%/ NO ISCHEMIA  . COLONOSCOPY WITH PROPOFOL N/A 09/24/2012   Procedure: COLONOSCOPY WITH PROPOFOL;  Surgeon: [name], MD;  [facility];  Service: Endoscopy;  Laterality: N/A;  . CORONARY ANGIOPLASTY WITH STENT PLACEMENT  OCT 1999   STENT OF LAD  . CYSTOSCOPY WITH URETHRAL DILATATION  03/12/2017   Procedure: CYSTOSCOPY WITH URETHRAL DILATATION;  Surgeon: Gaynelle Arabian, MD;  Location: [facility];  Service: Orthopedics;;  Ammie Dalton, Resident Assisting  . LAPAROSCOPIC INGUINAL HERNIA REPAIR Bilateral 01-10-2004   W/ MESH  . neck benign removed from neck  yrs ago  . PROSTATE BIOPSY N/A 07/12/2012   Procedure: PROSTATE BIOPSY AND ULTRASOUND;  Surgeon: Ailene Rud, MD;  Location[facility];  Service: Urology;  Laterality: N/A;  . PROSTATE SURGERY  2002   tuna  . REMOVAL LEFT NECK LYMPH NODE  1996  . TONSILLECTOMY  CHILD  . TOTAL KNEE ARTHROPLASTY Right 03/12/2017   Procedure: RIGHT TOTAL KNEE ARTHROPLASTY;  Surgeon: Gaynelle Arabian, MD;  [facility];  Service: Orthopedics;  Laterality: Right;  Adductor Block  . TRANSURETHRAL RESECTION OF PROSTATE N/A 08/08/2013   Procedure: TRANSURETHRAL RESECTION OF THE PROSTATE WITH GYRUS INSTRUMENTS;  Surgeon: Ailene Rud, MD;  Location[facility];  Service: Urology;  Laterality: N/A;  . TRANSURETHRAL RESECTION OF PROSTATE      Current Medications: Current Meds  Medication Sig  . amLODipine (NORVASC) 2.5 MG tablet Take 1 tablet (2.5 mg total) by mouth daily.  Marland Kitchen amphetamine-dextroamphetamine (ADDERALL) 5 MG tablet Take 5 mg by mouth 2 (two) times daily.  Marland Kitchen aspirin EC 81 MG tablet Take 81 mg by mouth daily.  Marland Kitchen atorvastatin (LIPITOR) 20 MG tablet Take 1 tablet (20 mg total) by mouth every morning. Please make overdue appt for future refills. [phone]. 1st attempt.  . Cholecalciferol (VITAMIN D3) 1000 units CAPS Take 2,000 Units by mouth daily.   . Coenzyme Q10 (CO Q-10) 200 MG CAPS Take 200 mg by mouth daily.    . Cyanocobalamin (VITAMIN B-12) 2500 MCG SUBL Place 2,500 mcg under the tongue daily.  Marland Kitchen ibuprofen (ADVIL,MOTRIN) 200 MG tablet Take 600 mg by mouth every 6 (six) hours as needed for headache or moderate pain.  Marland Kitchen omeprazole (PRILOSEC OTC) 20 MG tablet Take 20 mg by mouth daily.  . [DISCONTINUED] amLODipine (NORVASC) 2.5 MG tablet Take 1 tablet (2.5 mg total) by mouth daily. Please make overdue appt with Dr. Tamala Julian before anymore refills. 2nd attempt     Allergies:   Patient has no known allergies.   Social History   Socioeconomic History  . Marital status: Married    Spouse name: Not on file  . Number of children: Not on file  . Years of education: Not on file  . Highest education level: Not on file  Occupational History  . Not on file  Social Needs  . Financial resource strain: Not on file  . Food insecurity:    Worry: Not on file    Inability: Not on file  . Transportation needs:    Medical: Not on file    Non-medical: Not on file  Tobacco Use  . Smoking status: Former Smoker    Packs/day: 1.00    Years: 10.00    Pack years: 10.00    Types: Cigarettes    Last attempt to quit: 07/09/1984    Years since quitting: 33.7  . Smokeless tobacco: Never Used  Substance and Sexual Activity  . Alcohol use: Yes    Alcohol/week: 2.0 standard drinks    Types: 2 Standard drinks or equivalent per week    Comment: daily 1 per day  . Drug use: No  . Sexual activity: Not on file  Lifestyle  . Physical activity:    Days per week: Not on file    Minutes per session: Not on file  . Stress: Not on file  Relationships  . Social connections:    Talks on phone: Not on file    Gets together: Not on file    Attends religious service: Not on file    Active member of club or organization: Not on file    Attends meetings of clubs or organizations: Not on file    Relationship status: Not on file  Other Topics Concern  . Not on file  Social History Narrative   Lives at home with is wife,  [name].  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  Has BBA.  Has 3 children.    Caffeine 2 cups coffee.       Family History: The patient's family history includes Diabetes in his brother; Obesity in his brother; Parkinsonism in his brother.  ROS:   Please see the history of present illness.    Difficulty with balance, tremor, decreased activity level although he says he goes to the gym 5 times per week.  Constipation.  All other systems reviewed  and are negative.  EKGs/Labs/Other Studies Reviewed:    The following studies were reviewed today: Nuclear stress test 12/18/2016: Study Highlights     Nuclear stress EF: 57%.  Blood pressure demonstrated a normal response to exercise.  There was no ST segment deviation noted during stress.  The study is normal.  This is a low risk study.  The left ventricular ejection fraction is normal (55-65%).   Normal resting and stress perfusion. No ischemia or infarction EF 57%      EKG:  EKG is performed today on 03/18/2018 and reveals nonspecific ST abnormality but otherwise normal with heart rate of 75 bpm.  No significant change compared to prior.  Recent Labs: 03/20/2017: ALT 29; BUN 23; Creatinine, Ser 1.14; Potassium 4.2; Sodium 132 12/07/2017: Hemoglobin 15.2; Platelet Count 224  Recent Lipid Panel    Component Value Date/Time   CHOL 154 12/14/2015 0806   TRIG 118 12/14/2015 0806   HDL 47 12/14/2015 0806   CHOLHDL 3.3 12/14/2015 0806   VLDL 24 12/14/2015 0806   LDLCALC 83 12/14/2015 0806    Physical Exam:    VS:  BP (!) 144/96   Pulse 75   Ht 5' 7.5" (1.715 m)   Wt 185 lb 3.2 oz (84 kg)   SpO2 97%   BMI 28.58 kg/m     Wt Readings from Last 3 Encounters:  03/18/18 185 lb 3.2 oz (84 kg)  12/07/17 185 lb 12.8 oz (84.3 kg)  04/11/17 181 lb 8 oz (82.3 kg)     GEN: Abdominal t obesity.. No acute distress HEENT: Normal NECK: No JVD. LYMPHATICS: No lymphadenopathy CARDIAC: RRR.  No murmur, gallop, edema VASCULAR: Pulses are 2+  in the radial and bilateral posterior tibial., Bruits are absent in the radial and carotid. RESPIRATORY:  Clear to auscultation without rales, wheezing or rhonchi  ABDOMEN: Soft, non-tender, non-distended, No pulsatile mass, MUSCULOSKELETAL: No deformity  SKIN: Warm and dry NEUROLOGIC:  Alert and oriented x 3 PSYCHIATRIC:  Normal affect   ASSESSMENT:    1. Coronary artery disease involving native coronary artery of native heart without angina pectoris   2. Essential hypertension   3. Mixed hyperlipidemia    PLAN:    In order of problems listed above:  1. Stable coronary artery disease.  Aggressive secondary risk prevention should continue as outlined below and discussed with the patient. 2. Close should be kept on the blood pressure which ideally should be 130/80.  We discussed low-salt diet and weight loss. 3. LDL on current lipid regimen every other day reveals an LDL of 64 February 14, 2018.  No changes needed.  Overall education and awareness concerning primary/secondary risk prevention was discussed in detail: LDL less than 70, hemoglobin A1c less than 7, blood pressure target less than 130/80 mmHg, >150 minutes of moderate aerobic activity per week, avoidance of smoking, weight control (via diet and exercise), and continued surveillance/management of/for obstructive sleep apnea.  Clinical follow-up in 1 year.   Medication Adjustments/Labs and Tests Ordered: Current medicines are reviewed at length with the patient today.  Concerns regarding medicines are outlined above.  Orders Placed This Encounter  Procedures  . EKG 12-Lead   Meds ordered this encounter  Medications  . amLODipine (NORVASC) 2.5 MG tablet    Sig: Take 1 tablet (2.5 mg total) by mouth daily.    Dispense:  90 tablet    Refill:  3    Patient Instructions  Medication Instructions:  No change If you need a  refill on your cardiac medications before your next appointment, please call your pharmacy.   Lab  work: none If you have labs (blood work) drawn today and your tests are completely normal, you will receive your results only by: Marland Kitchen MyChart Message (if you have MyChart) OR . A paper copy in the mail If you have any lab test that is abnormal or we need to change your treatment, we will call you to review the results.  Testing/Procedures: none  Follow-Up: At [facility], you and your health needs are our priority.  As part of our continuing mission to provide you with exceptional heart care, we have created designated Provider Care Teams.  These Care Teams include your primary Cardiologist (physician) and Advanced Practice Providers (APPs -  Physician Assistants and Nurse Practitioners) who all work together to provide you with the care you need, when you need it. You will need a follow up appointment in 12 months.  Please call our office 2 months in advance to schedule this appointment.  You may see Sinclair Grooms, MD or one of the following Advanced Practice Providers on your designated Care Team:   Truitt Merle, NP Cecilie Kicks, NP . Kathyrn Drown, NP  Any Other Special Instructions Will Be Listed Below (If Applicable).       Signed, Sinclair Grooms, MD  03/18/2018 2:18 PM    [facility]

## 2018-03-18 ENCOUNTER — Encounter: Payer: Self-pay | Admitting: Interventional Cardiology

## 2018-03-18 ENCOUNTER — Ambulatory Visit (INDEPENDENT_AMBULATORY_CARE_PROVIDER_SITE_OTHER): Payer: Medicare Other | Admitting: Interventional Cardiology

## 2018-03-18 VITALS — BP 144/96 | HR 75 | Ht 67.5 in | Wt 185.2 lb

## 2018-03-18 DIAGNOSIS — I2510 Atherosclerotic heart disease of native coronary artery without angina pectoris: Secondary | ICD-10-CM | POA: Diagnosis not present

## 2018-03-18 DIAGNOSIS — E782 Mixed hyperlipidemia: Secondary | ICD-10-CM

## 2018-03-18 DIAGNOSIS — I10 Essential (primary) hypertension: Secondary | ICD-10-CM

## 2018-03-18 MED ORDER — AMLODIPINE BESYLATE 2.5 MG PO TABS: 2.5000 mg | ORAL_TABLET | Freq: Every day | ORAL | 3 refills | Status: DC

## 2018-03-18 NOTE — Patient Instructions (Signed)
Medication Instructions:  No change If you need a refill on your cardiac medications before your next appointment, please call your pharmacy.   Lab work: none If you have labs (blood work) drawn today and your tests are completely normal, you will receive your results only by: Marland Kitchen MyChart Message (if you have MyChart) OR . A paper copy in the mail If you have any lab test that is abnormal or we need to change your treatment, we will call you to review the results.  Testing/Procedures: none  Follow-Up: At [facility], you and your health needs are our priority.  As part of our continuing mission to provide you with exceptional heart care, we have created designated Provider Care Teams.  These Care Teams include your primary Cardiologist (physician) and Advanced Practice Providers (APPs -  Physician Assistants and Nurse Practitioners) who all work together to provide you with the care you need, when you need it. You will need a follow up appointment in 12 months.  Please call our office 2 months in advance to schedule this appointment.  You may see Sinclair Grooms, MD or one of the following Advanced Practice Providers on your designated Care Team:   Truitt Merle, NP Cecilie Kicks, NP . Kathyrn Drown, NP  Any Other Special Instructions Will Be Listed Below (If Applicable).

## 2018-03-20 DIAGNOSIS — H2511 Age-related nuclear cataract, right eye: Secondary | ICD-10-CM | POA: Diagnosis not present

## 2018-03-20 DIAGNOSIS — H25011 Cortical age-related cataract, right eye: Secondary | ICD-10-CM | POA: Diagnosis not present

## 2018-03-20 DIAGNOSIS — H25012 Cortical age-related cataract, left eye: Secondary | ICD-10-CM | POA: Diagnosis not present

## 2018-03-20 DIAGNOSIS — H2512 Age-related nuclear cataract, left eye: Secondary | ICD-10-CM | POA: Diagnosis not present

## 2018-03-27 DIAGNOSIS — H25012 Cortical age-related cataract, left eye: Secondary | ICD-10-CM | POA: Diagnosis not present

## 2018-03-27 DIAGNOSIS — H2512 Age-related nuclear cataract, left eye: Secondary | ICD-10-CM | POA: Diagnosis not present

## 2018-04-15 ENCOUNTER — Ambulatory Visit (INDEPENDENT_AMBULATORY_CARE_PROVIDER_SITE_OTHER): Payer: Medicare Other | Admitting: Neurology

## 2018-04-15 ENCOUNTER — Encounter: Payer: Self-pay | Admitting: Neurology

## 2018-04-15 VITALS — BP 146/86 | HR 74 | Ht 67.5 in | Wt 184.0 lb

## 2018-04-15 DIAGNOSIS — G250 Essential tremor: Secondary | ICD-10-CM | POA: Diagnosis not present

## 2018-04-15 DIAGNOSIS — I2510 Atherosclerotic heart disease of native coronary artery without angina pectoris: Secondary | ICD-10-CM | POA: Diagnosis not present

## 2018-04-15 NOTE — Progress Notes (Signed)
Reason for visit: Tremor  Cole Martinez is an 73 y.o. male  History of present illness:  Cole Martinez is a 73 year old right-handed white male with a history of an essential tremor.  The patient has an action and resting components of both upper extremities, he has had the tremor at least since 2007.  There is a head and neck component to the tremor at times, the tremors worsen when he exercises or is upset about something.  He does use Adderall in low-dose.  He is continuing to work.  He is able to operate a mouse with the left hand, but he has difficulty holding a plate or another object in the left hand because of the tremor.  His handwriting has become somewhat sloppy and small.  He denies any speech changes or swallowing changes, he does note some mild gait instability, he reports no recent falls.  He drinks 2 cups of coffee a day and sometimes he will drink tea as well.  He returns for an evaluation.  He has not wanted medications for tremor.  Past Medical History:  Diagnosis Date  . ADHD (attention deficit hyperactivity disorder)   . B12 deficiency   . BPH (benign prostatic hypertrophy)   . 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 Coronary artery disease CARDIOLOGIST- DR Daneen Schick   S/P STENTING LAD 1999  . Crohn's disease of ileum ([place]) SINCE 1988  . ED (erectile dysfunction)   . Elevated PSA   . Essential and other specified forms of tremor 11/25/2012  . H/O adenomatous polyp of colon   . Hyperlipidemia   . Nocturia   . OA (osteoarthritis)   . Peripheral neuropathy    hx of, none current as of 08-04-13  . Peyronie disease   . S/P coronary artery stent placement OCT 1999 OF LAD  . Tremor, hereditary, benign MILD RIGHT HAND    Past Surgical History:  Procedure Laterality Date  . CARDIOVASCULAR STRESS TEST  11-01-2010 DR Daneen Schick   NORMAL PERFUSION STUDY/ EF 64%/  NO ISCHEMIA  . COLONOSCOPY WITH PROPOFOL N/A 09/24/2012   Procedure: COLONOSCOPY WITH PROPOFOL;  Surgeon: [name], MD;  [facility];  Service: Endoscopy;  Laterality: N/A;  . CORONARY ANGIOPLASTY WITH STENT PLACEMENT  OCT 1999   STENT OF LAD  . CYSTOSCOPY WITH URETHRAL DILATATION  03/12/2017   Procedure: CYSTOSCOPY WITH URETHRAL DILATATION;  Surgeon: Gaynelle Arabian, MD;  [facility];  Service: Orthopedics;;  Ammie Dalton, Resident Assisting  . LAPAROSCOPIC INGUINAL HERNIA REPAIR Bilateral 01-10-2004   W/ MESH  . neck benign removed from neck  yrs ago  . PROSTATE BIOPSY N/A 07/12/2012   Procedure: PROSTATE BIOPSY AND ULTRASOUND;  Surgeon: Ailene Rud, MD;  Location: [facility];  Service: Urology;  Laterality: N/A;  . PROSTATE SURGERY  2002   tuna  . REMOVAL LEFT NECK LYMPH NODE  1996  . TONSILLECTOMY  CHILD  . TOTAL KNEE ARTHROPLASTY Right 03/12/2017   Procedure: RIGHT TOTAL KNEE ARTHROPLASTY;  Surgeon: Gaynelle Arabian, MD;  [facility];  Service: Orthopedics;  Laterality: Right;  Adductor Block  . TRANSURETHRAL RESECTION OF PROSTATE N/A 08/08/2013   Procedure: TRANSURETHRAL RESECTION OF THE PROSTATE WITH GYRUS INSTRUMENTS;  Surgeon: Ailene Rud, MD;  [facility];  Service: Urology;  Laterality: N/A;  . TRANSURETHRAL RESECTION OF PROSTATE  Family History  Problem Relation Age of Onset  . Obesity Brother   . Diabetes Brother   . Parkinsonism Brother     Social history:  reports that he quit smoking about 33 years ago. His smoking use included cigarettes. He has a 10.00 pack-year smoking history. He has never used smokeless tobacco. He reports current alcohol use of about 2.0 standard drinks of alcohol per week. He reports that he does not use drugs.   No Known Allergies  Medications:  Prior to Admission medications   Medication Sig Start Date End Date Taking? Authorizing Provider  amLODipine (NORVASC) 2.5 MG tablet Take 1  tablet (2.5 mg total) by mouth daily. 03/18/18   Belva Crome, MD  amphetamine-dextroamphetamine (ADDERALL) 5 MG tablet Take 5 mg by mouth 2 (two) times daily.    [provider]  aspirin EC 81 MG tablet Take 81 mg by mouth daily.    [provider]  atorvastatin (LIPITOR) 20 MG tablet Take 1 tablet (20 mg total) by mouth every morning. Please make overdue appt for future refills. [phone]. 1st attempt. 01/16/18   Belva Crome, MD  Cholecalciferol (VITAMIN D3) 1000 units CAPS Take 2,000 Units by mouth daily.     [provider]  Coenzyme Q10 (CO Q-10) 200 MG CAPS Take 200 mg by mouth daily.     [provider]  Cyanocobalamin (VITAMIN B-12) 2500 MCG SUBL Place 2,500 mcg under the tongue daily.    [provider]  ibuprofen (ADVIL,MOTRIN) 200 MG tablet Take 600 mg by mouth every 6 (six) hours as needed for headache or moderate pain.    [provider]  omeprazole (PRILOSEC OTC) 20 MG tablet Take 20 mg by mouth daily.    [provider]    ROS:  Out of a complete 14 system review of symptoms, the patient complains only of the following symptoms, and all other reviewed systems are negative.  Runny nose Restless legs, recurrent waking, snoring Frequency of urination Muscle cramps, walking difficulty Tremors  Height 5' 7.5" (1.715 m), weight 184 lb (83.5 kg).  Physical Exam  General: The patient is alert and cooperative at the time of the examination.  The patient is moderately obese.  Skin: No significant peripheral edema is noted.   Neurologic Exam  Mental status: The patient is alert and oriented x 3 at the time of the examination. The patient has apparent normal recent and remote memory, with an apparently normal attention span and concentration ability.   Cranial nerves: Facial symmetry is present. Speech is normal, no aphasia or dysarthria is noted. Extraocular movements are full. Visual fields are  full.  Motor: The patient has good strength in all 4 extremities.  Sensory examination: Soft touch sensation is symmetric on the face, arms, and legs.  Coordination: The patient has good finger-nose-finger and heel-to-shin bilaterally.  The patient has minimal tremor with finger-nose-finger bilaterally, tremors are seen bilaterally with resting components.  Gait and station: The patient has a normal gait, the patient has symmetric arm swing. Tandem gait is very slightly unsteady. Romberg is negative. No drift is seen.  Reflexes: Deep tendon reflexes are symmetric.   Assessment/Plan:  1.  History of tremor, likely essential tremor  We discussed the possibility of using medications for the tremor, the patient does not wish to do this at this time.  He will follow-up in 1 year.  He will contact me if he changes his mind.  Jill Alexanders MD 04/15/2018  8:03 AM  [facility] [address] [place], [place] [address]  Phone [phone] Fax [phone]

## 2018-04-30 ENCOUNTER — Ambulatory Visit
Admission: RE | Admit: 2018-04-30 | Discharge: 2018-04-30 | Disposition: A | Discharge status: Home / Self Care | Payer: Medicare Other | Source: Ambulatory Visit | Attending: Gastroenterology | Admitting: Gastroenterology

## 2018-04-30 DIAGNOSIS — Z86010 Personal history of colonic polyps: Secondary | ICD-10-CM

## 2018-04-30 DIAGNOSIS — K5730 Diverticulosis of large intestine without perforation or abscess without bleeding: Secondary | ICD-10-CM

## 2018-04-30 DIAGNOSIS — K562 Volvulus: Secondary | ICD-10-CM | POA: Diagnosis not present

## 2018-05-06 DIAGNOSIS — K5000 Crohn's disease of small intestine without complications: Secondary | ICD-10-CM | POA: Diagnosis not present

## 2018-05-06 DIAGNOSIS — R933 Abnormal findings on diagnostic imaging of other parts of digestive tract: Secondary | ICD-10-CM | POA: Diagnosis not present

## 2018-05-06 DIAGNOSIS — Z86010 Personal history of colonic polyps: Secondary | ICD-10-CM | POA: Diagnosis not present

## 2018-05-06 DIAGNOSIS — K219 Gastro-esophageal reflux disease without esophagitis: Secondary | ICD-10-CM | POA: Diagnosis not present

## 2018-05-20 DIAGNOSIS — R6882 Decreased libido: Secondary | ICD-10-CM | POA: Diagnosis not present

## 2018-05-20 DIAGNOSIS — I2510 Atherosclerotic heart disease of native coronary artery without angina pectoris: Secondary | ICD-10-CM | POA: Diagnosis not present

## 2018-05-20 DIAGNOSIS — E230 Hypopituitarism: Secondary | ICD-10-CM | POA: Diagnosis not present

## 2018-05-20 DIAGNOSIS — Z833 Family history of diabetes mellitus: Secondary | ICD-10-CM | POA: Diagnosis not present

## 2018-05-20 DIAGNOSIS — M8580 Other specified disorders of bone density and structure, unspecified site: Secondary | ICD-10-CM | POA: Diagnosis not present

## 2018-05-20 DIAGNOSIS — R12 Heartburn: Secondary | ICD-10-CM | POA: Diagnosis not present

## 2018-05-20 DIAGNOSIS — N529 Male erectile dysfunction, unspecified: Secondary | ICD-10-CM | POA: Diagnosis not present

## 2018-05-20 DIAGNOSIS — C9110 Chronic lymphocytic leukemia of B-cell type not having achieved remission: Secondary | ICD-10-CM | POA: Diagnosis not present

## 2018-05-20 DIAGNOSIS — I10 Essential (primary) hypertension: Secondary | ICD-10-CM | POA: Diagnosis not present

## 2018-05-20 DIAGNOSIS — R7303 Prediabetes: Secondary | ICD-10-CM | POA: Diagnosis not present

## 2018-05-20 DIAGNOSIS — F900 Attention-deficit hyperactivity disorder, predominantly inattentive type: Secondary | ICD-10-CM | POA: Diagnosis not present

## 2018-05-28 ENCOUNTER — Other Ambulatory Visit: Payer: Self-pay | Admitting: Interventional Cardiology

## 2018-05-28 MED FILL — AMPHETAMINE-DEXTROAMPHETAMI: 5 | 30 days supply | Qty: 60 | Fill #0

## 2018-07-10 MED FILL — AMPHETAMINE-DEXTROAMPHETAMI: 5 | 30 days supply | Qty: 60 | Fill #0

## 2018-08-01 DIAGNOSIS — Z961 Presence of intraocular lens: Secondary | ICD-10-CM | POA: Insufficient documentation

## 2018-08-01 DIAGNOSIS — H26491 Other secondary cataract, right eye: Secondary | ICD-10-CM | POA: Diagnosis not present

## 2018-08-07 DIAGNOSIS — H26491 Other secondary cataract, right eye: Secondary | ICD-10-CM | POA: Diagnosis not present

## 2018-08-13 ENCOUNTER — Encounter: Payer: Self-pay | Admitting: Podiatry

## 2018-08-13 ENCOUNTER — Ambulatory Visit (INDEPENDENT_AMBULATORY_CARE_PROVIDER_SITE_OTHER): Payer: Medicare Other | Admitting: Podiatry

## 2018-08-13 ENCOUNTER — Other Ambulatory Visit: Payer: Self-pay

## 2018-08-13 VITALS — Temp 97.3°F

## 2018-08-13 DIAGNOSIS — B351 Tinea unguium: Secondary | ICD-10-CM | POA: Diagnosis not present

## 2018-08-13 DIAGNOSIS — I2510 Atherosclerotic heart disease of native coronary artery without angina pectoris: Secondary | ICD-10-CM | POA: Diagnosis not present

## 2018-08-13 DIAGNOSIS — M79676 Pain in unspecified toe(s): Secondary | ICD-10-CM

## 2018-08-13 DIAGNOSIS — Q828 Other specified congenital malformations of skin: Secondary | ICD-10-CM | POA: Diagnosis not present

## 2018-08-13 NOTE — Progress Notes (Signed)
He presents today chief complaint of painful elongated toenails with corns and calluses.  Objective: Toenails are long thick yellow dystrophic most likely mycotic.  Reactive porokeratotic lesions plantar aspect of the bilateral foot and the distal aspect of the fourth toe.  No open lesions or wounds are noted.  Assessment: Pain in limb secondary to porokeratosis reactive hyperkeratotic lesions.  Plan: Debridement of all reactive hyperkeratotic lesion.  Debridement of all mycotic nails.

## 2018-08-14 DIAGNOSIS — R972 Elevated prostate specific antigen [PSA]: Secondary | ICD-10-CM | POA: Diagnosis not present

## 2018-08-16 ENCOUNTER — Other Ambulatory Visit: Payer: Self-pay | Admitting: Internal Medicine

## 2018-08-16 DIAGNOSIS — E230 Hypopituitarism: Secondary | ICD-10-CM

## 2018-08-21 DIAGNOSIS — R972 Elevated prostate specific antigen [PSA]: Secondary | ICD-10-CM | POA: Diagnosis not present

## 2018-08-21 DIAGNOSIS — R351 Nocturia: Secondary | ICD-10-CM | POA: Diagnosis not present

## 2018-08-21 DIAGNOSIS — R6882 Decreased libido: Secondary | ICD-10-CM | POA: Diagnosis not present

## 2018-08-21 DIAGNOSIS — R310 Gross hematuria: Secondary | ICD-10-CM | POA: Diagnosis not present

## 2018-08-21 DIAGNOSIS — N35011 Post-traumatic bulbous urethral stricture: Secondary | ICD-10-CM | POA: Diagnosis not present

## 2018-08-21 DIAGNOSIS — N401 Enlarged prostate with lower urinary tract symptoms: Secondary | ICD-10-CM | POA: Diagnosis not present

## 2018-08-29 DIAGNOSIS — R7303 Prediabetes: Secondary | ICD-10-CM | POA: Diagnosis not present

## 2018-08-29 DIAGNOSIS — Z1159 Encounter for screening for other viral diseases: Secondary | ICD-10-CM | POA: Diagnosis not present

## 2018-08-29 DIAGNOSIS — R7309 Other abnormal glucose: Secondary | ICD-10-CM | POA: Diagnosis not present

## 2018-08-29 DIAGNOSIS — F900 Attention-deficit hyperactivity disorder, predominantly inattentive type: Secondary | ICD-10-CM | POA: Diagnosis not present

## 2018-08-29 DIAGNOSIS — R12 Heartburn: Secondary | ICD-10-CM | POA: Diagnosis not present

## 2018-08-29 DIAGNOSIS — N529 Male erectile dysfunction, unspecified: Secondary | ICD-10-CM | POA: Diagnosis not present

## 2018-08-29 MED FILL — AMPHETAMINE-DEXTROAMPHETAMI: 5 | 30 days supply | Qty: 60 | Fill #0

## 2018-09-02 DIAGNOSIS — Z1159 Encounter for screening for other viral diseases: Secondary | ICD-10-CM | POA: Diagnosis not present

## 2018-09-03 ENCOUNTER — Other Ambulatory Visit: Payer: Self-pay | Admitting: Urology

## 2018-09-04 DIAGNOSIS — Z7982 Long term (current) use of aspirin: Secondary | ICD-10-CM | POA: Diagnosis not present

## 2018-09-04 DIAGNOSIS — K5090 Crohn's disease, unspecified, without complications: Secondary | ICD-10-CM | POA: Diagnosis not present

## 2018-09-04 DIAGNOSIS — K5730 Diverticulosis of large intestine without perforation or abscess without bleeding: Secondary | ICD-10-CM | POA: Diagnosis not present

## 2018-09-04 DIAGNOSIS — E7800 Pure hypercholesterolemia, unspecified: Secondary | ICD-10-CM | POA: Diagnosis not present

## 2018-09-04 DIAGNOSIS — D122 Benign neoplasm of ascending colon: Secondary | ICD-10-CM | POA: Diagnosis not present

## 2018-09-04 DIAGNOSIS — H9190 Unspecified hearing loss, unspecified ear: Secondary | ICD-10-CM | POA: Diagnosis not present

## 2018-09-04 DIAGNOSIS — K635 Polyp of colon: Secondary | ICD-10-CM | POA: Diagnosis not present

## 2018-09-04 DIAGNOSIS — I2510 Atherosclerotic heart disease of native coronary artery without angina pectoris: Secondary | ICD-10-CM | POA: Diagnosis not present

## 2018-09-04 DIAGNOSIS — D124 Benign neoplasm of descending colon: Secondary | ICD-10-CM | POA: Diagnosis not present

## 2018-09-04 DIAGNOSIS — Q439 Congenital malformation of intestine, unspecified: Secondary | ICD-10-CM | POA: Diagnosis not present

## 2018-09-04 DIAGNOSIS — D120 Benign neoplasm of cecum: Secondary | ICD-10-CM | POA: Diagnosis not present

## 2018-09-04 DIAGNOSIS — K633 Ulcer of intestine: Secondary | ICD-10-CM | POA: Diagnosis not present

## 2018-09-04 DIAGNOSIS — Z79899 Other long term (current) drug therapy: Secondary | ICD-10-CM | POA: Diagnosis not present

## 2018-09-04 DIAGNOSIS — D126 Benign neoplasm of colon, unspecified: Secondary | ICD-10-CM | POA: Diagnosis not present

## 2018-09-04 DIAGNOSIS — K648 Other hemorrhoids: Secondary | ICD-10-CM | POA: Diagnosis not present

## 2018-09-04 DIAGNOSIS — K219 Gastro-esophageal reflux disease without esophagitis: Secondary | ICD-10-CM | POA: Diagnosis not present

## 2018-09-10 DIAGNOSIS — R310 Gross hematuria: Secondary | ICD-10-CM | POA: Diagnosis not present

## 2018-09-10 DIAGNOSIS — N200 Calculus of kidney: Secondary | ICD-10-CM | POA: Diagnosis not present

## 2018-09-30 ENCOUNTER — Other Ambulatory Visit (HOSPITAL_COMMUNITY)
Admission: RE | Admit: 2018-09-30 | Discharge: 2018-09-30 | Disposition: A | Discharge status: Home / Self Care | Payer: Medicare Other | Source: Ambulatory Visit | Attending: Urology | Admitting: Urology

## 2018-09-30 DIAGNOSIS — Z20828 Contact with and (suspected) exposure to other viral communicable diseases: Secondary | ICD-10-CM | POA: Diagnosis not present

## 2018-09-30 LAB — SARS CORONAVIRUS 2 (TAT 6-24 HRS): SARS Coronavirus 2: NEGATIVE

## 2018-10-01 NOTE — Progress Notes (Signed)
lov cards Dr Daneen Schick 03-18-2018 epic   ekg 03-18-2018 epic   Stress test 12-18-16 epic   cxr 03-20-17 epic

## 2018-10-01 NOTE — Patient Instructions (Addendum)
YOU HAVE COMPLETED YOUR COVID TEST.  PLEASE BEGIN THE QUARANTINE INSTRUCTIONS AS OUTLINED IN YOUR HANDOUT.                Cole Martinez   Your procedure is scheduled on: 10-03-2018   Report to [facility]  Entrance    Report to admitting at 9:30AM   [address] [place].    Call this number if you have problems the morning of surgery [phone]    Remember: Do not eat food or drink liquids :After Midnight. BRUSH YOUR TEETH MORNING OF SURGERY AND RINSE YOUR MOUTH OUT, NO CHEWING GUM CANDY OR MINTS.     Take these medicines the morning of surgery with A SIP OF WATER: amlodipine                                You may not have any metal on your body including hair pins and              piercings  Do not wear jewelry, make-up, lotions, powders or perfumes, deodorant                   Men may shave face and neck.   Do not bring valuables to the hospital. [place].  Contacts, dentures or bridgework may not be worn into surgery.      Patients discharged the day of surgery will not be allowed to drive home. IF YOU ARE HAVING SURGERY AND GOING HOME THE SAME DAY, YOU MUST HAVE AN ADULT TO DRIVE YOU HOME AND BE WITH YOU FOR 24 HOURS. YOU MAY GO HOME BY TAXI OR UBER OR ORTHERWISE, BUT AN ADULT MUST ACCOMPANY YOU HOME AND STAY WITH YOU FOR 24 HOURS.  Name and phone number of your driver:  Special Instructions: N/A              Please read over the following fact sheets you were given: _____________________________________________________________________             Cole Martinez - Preparing for Surgery Before surgery, you can play an important role.  Because skin is not sterile, your skin needs to be as free of germs as possible.  You can reduce the number of germs on your skin by washing with CHG (chlorahexidine gluconate) soap before surgery.  CHG is an antiseptic  cleaner which kills germs and bonds with the skin to continue killing germs even after washing. Please DO NOT use if you have an allergy to CHG or antibacterial soaps.  If your skin becomes reddened/irritated stop using the CHG and inform your nurse when you arrive at Short Stay. Do not shave (including legs and underarms) for at least 48 hours prior to the first CHG shower.  You may shave your face/neck. Please follow these instructions carefully:  1.  Shower with CHG Soap the night before surgery and the  morning of Surgery.  2.  If you choose to wash your hair, wash your hair first as usual with your  normal  shampoo.  3.  After you shampoo, rinse your hair and body thoroughly to remove the  shampoo.  4.  Use CHG as you would any other liquid soap.  You can apply chg directly  to the skin and wash                       Gently with a scrungie or clean washcloth.  5.  Apply the CHG Soap to your body ONLY FROM THE NECK DOWN.   Do not use on face/ open                           Wound or open sores. Avoid contact with eyes, ears mouth and genitals (private parts).                       Wash face,  Genitals (private parts) with your normal soap.             6.  Wash thoroughly, paying special attention to the area where your surgery  will be performed.  7.  Thoroughly rinse your body with warm water from the neck down.  8.  DO NOT shower/wash with your normal soap after using and rinsing off  the CHG Soap.                9.  Pat yourself dry with a clean towel.            10.  Wear clean pajamas.            11.  Place clean sheets on your bed the night of your first shower and do not  sleep with pets. Day of Surgery : Do not apply any lotions/deodorants the morning of surgery.  Please wear clean clothes to the hospital/surgery Martinez.  FAILURE TO FOLLOW THESE INSTRUCTIONS MAY RESULT IN THE CANCELLATION OF YOUR SURGERY PATIENT  SIGNATURE_________________________________  NURSE SIGNATURE__________________________________  ________________________________________________________________________

## 2018-10-02 ENCOUNTER — Encounter (HOSPITAL_COMMUNITY)
Admission: RE | Admit: 2018-10-02 | Discharge: 2018-10-02 | Disposition: A | Discharge status: Home / Self Care | Payer: Medicare Other | Source: Ambulatory Visit | Attending: Urology | Admitting: Urology

## 2018-10-02 ENCOUNTER — Encounter (HOSPITAL_COMMUNITY): Payer: Self-pay

## 2018-10-02 ENCOUNTER — Other Ambulatory Visit: Payer: Self-pay

## 2018-10-02 DIAGNOSIS — Z79899 Other long term (current) drug therapy: Secondary | ICD-10-CM | POA: Diagnosis not present

## 2018-10-02 DIAGNOSIS — C9111 Chronic lymphocytic leukemia of B-cell type in remission: Secondary | ICD-10-CM | POA: Diagnosis not present

## 2018-10-02 DIAGNOSIS — Z955 Presence of coronary angioplasty implant and graft: Secondary | ICD-10-CM | POA: Diagnosis not present

## 2018-10-02 DIAGNOSIS — N5201 Erectile dysfunction due to arterial insufficiency: Secondary | ICD-10-CM | POA: Diagnosis not present

## 2018-10-02 DIAGNOSIS — N35912 Unspecified bulbous urethral stricture, male: Secondary | ICD-10-CM | POA: Diagnosis not present

## 2018-10-02 DIAGNOSIS — R310 Gross hematuria: Secondary | ICD-10-CM | POA: Diagnosis not present

## 2018-10-02 DIAGNOSIS — K5090 Crohn's disease, unspecified, without complications: Secondary | ICD-10-CM | POA: Diagnosis not present

## 2018-10-02 DIAGNOSIS — Z7982 Long term (current) use of aspirin: Secondary | ICD-10-CM | POA: Diagnosis not present

## 2018-10-02 DIAGNOSIS — Z791 Long term (current) use of non-steroidal anti-inflammatories (NSAID): Secondary | ICD-10-CM | POA: Diagnosis not present

## 2018-10-02 DIAGNOSIS — R351 Nocturia: Secondary | ICD-10-CM | POA: Diagnosis not present

## 2018-10-02 DIAGNOSIS — Z87891 Personal history of nicotine dependence: Secondary | ICD-10-CM | POA: Diagnosis not present

## 2018-10-02 DIAGNOSIS — N401 Enlarged prostate with lower urinary tract symptoms: Secondary | ICD-10-CM | POA: Diagnosis not present

## 2018-10-02 DIAGNOSIS — I2510 Atherosclerotic heart disease of native coronary artery without angina pectoris: Secondary | ICD-10-CM | POA: Diagnosis not present

## 2018-10-02 LAB — BASIC METABOLIC PANEL
Anion gap: 8 (ref 5–15)
BUN: 21 mg/dL (ref 8–23)
CO2: 27 mmol/L (ref 22–32)
Calcium: 8.5 mg/dL — ABNORMAL LOW (ref 8.9–10.3)
Chloride: 104 mmol/L (ref 98–111)
Creatinine, Ser: 0.94 mg/dL (ref 0.61–1.24)
GFR calc Af Amer: >60 mL/min (ref >60)
GFR calc non Af Amer: >60 mL/min (ref >60)
Glucose, Bld: 105 mg/dL — ABNORMAL HIGH (ref 70–99)
Potassium: 4.7 mmol/L (ref 3.5–5.1)
Sodium: 139 mmol/L (ref 135–145)

## 2018-10-02 LAB — CBC
HCT: 46.7 % (ref 39.0–52.0)
Hemoglobin: 15.2 g/dL (ref 13.0–17.0)
MCH: 30 pg (ref 26.0–34.0)
MCHC: 32.5 g/dL (ref 30.0–36.0)
MCV: 92.3 fL (ref 80.0–100.0)
Platelets: 227 10*3/uL (ref 150–400)
RBC: 5.06 MIL/uL (ref 4.22–5.81)
RDW: 13.8 % (ref 11.5–15.5)
WBC: 6.3 10*3/uL (ref 4.0–10.5)
nRBC: 0 % (ref 0.0–0.2)

## 2018-10-02 NOTE — H&P (Signed)
1. BPH/LUTS  2. Urethral stricture  3. Erectile dysfunction/decreased libido  4. Abnormal prostate exam  5. Hematuria  6. History of urolithiasis   He returns today for annual evaluation. He states that his lower urinary tract symptoms have been relatively stable over the past year. IPSS today is 11. His only bothersome symptom is nocturia. He typically gets up 4 times per night. He usually drinks 2 cups of coffee during the morning and early afternoon. He typically has 1 16 oz alcoholic beverage in the evenings. This is mildly bothersome. He follows up today with a PVR.   He also complains of an episode of painless gross hematuria 2 months ago. This occurred only 1 time. He has not had any associated dysuria, flank pain, passage of any stones, etc. He does have a distant history of smoking and had smoked for approximately 40 years before quitting in the mid 1980s. He does have a history of urolithiasis. He does complain of some right flank pain in the mornings which typically improves throughout the day. This has been chronic and has not been exacerbated recently.   With regard to his erectile function and libido, he states that his libido has been decreased. In further discussion, he states that he feels that it is his erectile dysfunction which is secondarily impacting his libido. He most recently has been using sildenafil for on demand therapy. He has found this to be helpful but not satisfactory. He states that he had previously taken daily tadalafil with better results possibly due to the fact that he did not have to planned sexual activity. He does not take nitrates.     ALLERGIES: No Allergies    MEDICATIONS: Lipitor  Adderall 5 MG Oral Tablet Oral  Amlodipine Besylate 2.5 mg tablet  Aspirin 81 MG TABS Oral  Celebrex 200 mg capsule  Co Q10  PriLOSEC OTC TBEC Oral  Vitamin B 12  Vitamin D3  Vitamin E     GU PSH: Cystoscopy TURP - 2015 Prostate Needle Biopsy - 2014 TUMT - 2008      [place] Notes: Transurethral Resection Of Prostate (TURP), Complete Colonoscopy, Biopsy Of The Prostate Needle, Cath Stent Placement, Surg Prostate Transureth Dest Tissue Microwave Thermotherapy, Inguinal Hernia Repair   NON-GU PSH: Diagnostic Colonoscopy - 2014    GU PMH: Gross hematuria - 09/18/2017, - 09/17/2017, Gross hematuria, - 2016 Ureteral calculus (Improving) - 09/18/2017, - 09/17/2017, - 09/10/2017 Bulbar urethral stricture - 03/20/2017 BPH w/LUTS (Worsening), He does have moderate voiding symptoms with his biggest issue being nocturia 4. He is a light sleeper. He does drink a 20 ounce gin and tonic at 6:00 every night. This may contribute to his large urine volumes at night. - 11/29/2016, Benign prostatic hyperplasia with urinary obstruction, - 2016 ED due to arterial insufficiency, Most medical therapy has failed so far. He would like to consider other therapy - 11/29/2016, - 2017, Erectile dysfunction due to arterial insufficiency, - 2016 Elevated PSA, Prior history of biopsy in 2014. Biopsies all negative. Nodular prostate today consistent with prior therapeutic maneuvers to prostate - 11/29/2016, Elevated prostate specific antigen (PSA), - 2014 Nocturia (Worsening), This is bothersome to the patient. Perhaps due to nocturnal polyuria. 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 Gaynelle Martinez. I assumed his care in January 2019 when he presented as a hospital consultation with a urethral stricture.   1) BPH/LUTS: He is s/p a TUNA in 2002 by Dr. Gaynelle Martinez and eventually a TURP in June 2013.   Jun  2013: TURP (pathology benign)   2) Urethral stricture: He was incidentally noted to have a urethral stricture during cystoscopy at the time of catheter placement prior to a total joint replacement.   3) Urolithiasis: He has a history of calcium oxalate urolithiasis.   Jul 2019: Spontaneously passed a stone (calcium oxalate monohydrate)      NON-GU PMH: Crohns Disease (Stable), Crohn's Disease - 2014 Chronic lymphocytic leukemia of B-cell type in remission Parkinson''s disease    FAMILY HISTORY: Bile duct cancer - Father Death of family member - Runs In Family Deceased - Mother, Father Family Health Status Number - Runs In Family Father Deceased At [employer] ___ - Runs In Family Mother Deceased At Age 23 from diabetic complicati - Runs In Family No Significant Family History - Runs In Family   SOCIAL HISTORY: Marital Status: Married Preferred Language: English; Ethnicity: Not Hispanic Or Latino; Race: White Current Smoking Status: Patient does not smoke anymore. Smoked 1/2 pack per day.  Drinks 7 drinks per week.  Patient's occupation Company secretary.     Notes: Former smoker, Alcohol Use, Caffeine Use, Tobacco Use, Marital History - Currently Married, Occupation:   REVIEW OF SYSTEMS:    GU Review Male:   Patient reports frequent urination, hard to postpone urination, and get up at night to urinate. Patient denies burning/ pain with urination, leakage of urine, stream starts and stops, trouble starting your streams, and have to strain to urinate .  Gastrointestinal (Upper):   Patient denies nausea and vomiting.  Gastrointestinal (Lower):   Patient denies diarrhea and constipation.  Constitutional:   Patient denies fever, night sweats, weight loss, and fatigue.  Skin:   Patient denies skin rash/ lesion and itching.  Eyes:   Patient denies blurred vision and double vision.  Ears/ Nose/ Throat:   Patient denies sore throat and sinus problems.  Hematologic/Lymphatic:   Patient denies swollen  glands and easy bruising.  Cardiovascular:   Patient denies leg swelling and chest pains.  Respiratory:   Patient denies shortness of breath and cough.  Endocrine:   Patient denies excessive thirst.  Musculoskeletal:   Patient denies back pain and joint pain.  Neurological:   Patient denies headaches and dizziness.  Psychologic:   Patient denies depression and anxiety.   VITAL SIGNS:     Weight 174 lb / 78.93 kg  Height 68 in / 172.72 cm  BMI 26.5 kg/m   MULTI-SYSTEM PHYSICAL EXAMINATION:    Constitutional: Well-nourished. No physical deformities. Normally developed. Good grooming.  CV: RRR Lungs: Clear Gastrointestinal: No CVA tenderness. No masses.       ASSESSMENT:      ICD-10 Details  1 GU:   Gross hematuria - R31.0   2   Elevated PSA - R97.20   3   BPH w/LUTS - N40.1   4   Nocturia - R35.1   6   Bulbar urethral stricture - N35.011   5 NON-GU:   Decreased libido - R68.82    PLAN:        1. Gross hematuria: He will be scheduled for a hematuria protocol CT scan. His  renal function will be checked today. Due to his urethral stricture, I am unable to perform office cystoscopy. He will be scheduled for an outpatient surgical procedure for balloon dilation of his bulbar urethral stricture and cystoscopy. If he did have any bladder pathology, he understands that he may require biopsy or transurethral resection. We have reviewed that procedure in detail today. He gives informed consent to proceed.

## 2018-10-02 NOTE — Anesthesia Preprocedure Evaluation (Addendum)
Anesthesia Evaluation  Patient identified by MRN, date of birth, ID band Patient awake    Reviewed: Allergy & Precautions, NPO status , Patient's Chart, lab work & pertinent test results  Airway Mallampati: I  TM Distance: >3 FB Neck ROM: Full    Dental   Pulmonary former smoker,    Pulmonary exam normal        Cardiovascular + CAD and + Cardiac Stents  Normal cardiovascular exam     Neuro/Psych    GI/Hepatic   Endo/Other    Renal/GU      Musculoskeletal   Abdominal   Peds  Hematology   Anesthesia Other Findings   Reproductive/Obstetrics                            Anesthesia Physical Anesthesia Plan  ASA: III  Anesthesia Plan: General   Post-op Pain Management:    Induction: Intravenous  PONV Risk Score and Plan: 2 and Ondansetron and Midazolam  Airway Management Planned: LMA  Additional Equipment:   Intra-op Plan:   Post-operative Plan: Extubation in OR  Informed Consent: I have reviewed the patients History and Physical, chart, labs and discussed the procedure including the risks, benefits and alternatives for the proposed anesthesia with the patient or authorized representative who has indicated his/her understanding and acceptance.       Plan Discussed with: CRNA and Surgeon  Anesthesia Plan Comments: (See PAT note 10/02/2018, Konrad Felix, PA-C)       Anesthesia Quick Evaluation

## 2018-10-02 NOTE — Progress Notes (Signed)
Anesthesia Chart Review   Case: [id number] Date/Time: 10/03/18 1135   Procedure: CYSTOSCOPY WITH URETHRAL BALLOON DILATATION/ [POSSIBLE BLADDER BIOPSY/ POSSIBLE BILATERAL RETROGRADE PYELOGRAPHY (N/A )   Anesthesia type: General   Pre-op diagnosis: URETHRAL STRICTURE, HEMATURIA   Location: [facility] / [facility]   Surgeon: Raynelle Bring, MD      DISCUSSION:73 y.o. former smoker (10 pack years, quit 07/09/84) with h/o HLD, CAD (LAD bare metal stent 1999), Crohn's disease, chronic lymphocytic leukemia, urethral stricture, hematuria scheduled for above procedure 10/03/2018 with Dr. Raynelle Bring.   Pt last seen by cardiologist, Dr. Daneen Schick, 03/18/2018.  Stable at this visit with one year follow up recommended.    Anticipate pt can proceed with planned procedure barring acute status change.   VS: BP (!) 146/86   Pulse 71   Temp 36.8 C (Oral)   Resp 16   Ht 5' 7"  (1.702 m)   Wt 81 kg   SpO2 99%   BMI 27.97 kg/m   PROVIDERS: Josetta Huddle, MD is PCP   Daneen Schick, MD is Cardiologist  LABS: Labs reviewed: Acceptable for surgery. (all labs ordered are listed, but only abnormal results are displayed)  Labs Reviewed  BASIC METABOLIC PANEL - Abnormal; Notable for the following components:      Result Value   Glucose, Bld 105 (*)    Calcium 8.5 (*)    All other components within normal limits  CBC     IMAGES:   EKG: 03/18/2018 Rate 75 bpm Normal sinus rhythm  Nonspecific ST abnormality   CV: Myocardial Perfusion 12/18/16  Nuclear stress EF: 57%.  Blood pressure demonstrated a normal response to exercise.  There was no ST segment deviation noted during stress.  The study is normal.  This is a low risk study.  The left ventricular ejection fraction is normal (55-65%).   Normal resting and stress perfusion. No ischemia or infarction EF 57% Past Medical History:  Diagnosis Date  . ADHD (attention deficit hyperactivity disorder)   . B12 deficiency   . BPH  (benign prostatic hypertrophy)   . Chronic lymphocytic leukemia (CLL), T-cell ([place]) DX 1996--  ONCOLOGIST-  DR [facility]   PT IS ASYMPTOMATIC--- LAST CBC W/ DIFF 06-24-2012 STABLE  . Coronary artery disease CARDIOLOGIST- DR Daneen Schick   S/P STENTING LAD 1999  . Crohn's disease of ileum ([place]) SINCE 1988  . ED (erectile dysfunction)   . Elevated PSA   . Essential and other specified forms of tremor 11/25/2012  . H/O adenomatous polyp of colon   . Hyperlipidemia   . Nocturia   . OA (osteoarthritis)   . Peripheral neuropathy    hx of, none current as of 08-04-13  . Peyronie disease   . S/P coronary artery stent placement OCT 1999 OF LAD  . Tremor, hereditary, benign MILD RIGHT HAND    Past Surgical History:  Procedure Laterality Date  . CARDIOVASCULAR STRESS TEST  11-01-2010 DR Daneen Schick   NORMAL PERFUSION STUDY/ EF 64%/ NO ISCHEMIA  . cataract surgery  Bilateral feburary 2020   with lens placement   . COLONOSCOPY WITH PROPOFOL N/A 09/24/2012   Procedure: COLONOSCOPY WITH PROPOFOL;  Surgeon: [name], MD;  [facility];  Service: Endoscopy;  Laterality: N/A;  . CORONARY ANGIOPLASTY WITH STENT PLACEMENT  OCT 1999   STENT OF LAD  . CYSTOSCOPY WITH URETHRAL DILATATION  03/12/2017   Procedure: CYSTOSCOPY WITH URETHRAL DILATATION;  Surgeon: Gaynelle Arabian, MD;  Location: [facility];  Service: Orthopedics;;  Ammie Dalton, Resident Assisting  . LAPAROSCOPIC INGUINAL HERNIA REPAIR Bilateral 01-10-2004   W/ MESH  . neck benign removed from neck  yrs ago  . PROSTATE BIOPSY N/A 07/12/2012   Procedure: PROSTATE BIOPSY AND ULTRASOUND;  Surgeon: Ailene Rud, MD;  Location: [facility];  Service: Urology;  Laterality: N/A;  . PROSTATE SURGERY  2002   tuna  . REMOVAL LEFT NECK LYMPH NODE  1996  . TONSILLECTOMY  CHILD  . TOTAL KNEE ARTHROPLASTY Right 03/12/2017   Procedure: RIGHT TOTAL KNEE ARTHROPLASTY;  Surgeon: Gaynelle Arabian, MD;  Location[facility];  Service:  Orthopedics;  Laterality: Right;  Adductor Block  . TRANSURETHRAL RESECTION OF PROSTATE N/A 08/08/2013   Procedure: TRANSURETHRAL RESECTION OF THE PROSTATE WITH GYRUS INSTRUMENTS;  Surgeon: Ailene Rud, MD;  Location: [facility];  Service: Urology;  Laterality: N/A;  . TRANSURETHRAL RESECTION OF PROSTATE      MEDICATIONS: . amLODipine (NORVASC) 2.5 MG tablet  . amphetamine-dextroamphetamine (ADDERALL) 5 MG tablet  . aspirin EC 81 MG tablet  . atorvastatin (LIPITOR) 20 MG tablet  . Cholecalciferol (VITAMIN D3) 50 MCG (2000 UT) capsule  . Coenzyme Q10 (CO Q-10) 200 MG CAPS  . Cyanocobalamin (VITAMIN B-12) 2500 MCG SUBL  . ibuprofen (ADVIL,MOTRIN) 200 MG tablet  . omeprazole (PRILOSEC OTC) 20 MG tablet  . tadalafil (CIALIS) 5 MG tablet   No current facility-administered medications for this encounter.     Maia Plan [facility] Pre-Surgical Testing [phone] 10/02/18  10:34 AM

## 2018-10-03 ENCOUNTER — Ambulatory Visit (HOSPITAL_COMMUNITY)
Admission: RE | Admit: 2018-10-03 | Discharge: 2018-10-03 | Disposition: A | Discharge status: Home / Self Care | Payer: Medicare Other | Attending: Urology | Admitting: Urology

## 2018-10-03 ENCOUNTER — Ambulatory Visit (HOSPITAL_COMMUNITY): Payer: Medicare Other | Admitting: Physician Assistant

## 2018-10-03 ENCOUNTER — Ambulatory Visit (HOSPITAL_COMMUNITY): Payer: Medicare Other

## 2018-10-03 ENCOUNTER — Encounter (HOSPITAL_COMMUNITY)
Admission: RE | Disposition: A | Discharge status: Home / Self Care | Payer: Self-pay | Source: Home / Self Care | Attending: Urology

## 2018-10-03 ENCOUNTER — Ambulatory Visit (HOSPITAL_COMMUNITY): Payer: Medicare Other | Admitting: Anesthesiology

## 2018-10-03 ENCOUNTER — Other Ambulatory Visit: Payer: Self-pay

## 2018-10-03 ENCOUNTER — Encounter (HOSPITAL_COMMUNITY): Payer: Self-pay | Admitting: *Deleted

## 2018-10-03 DIAGNOSIS — N401 Enlarged prostate with lower urinary tract symptoms: Secondary | ICD-10-CM | POA: Diagnosis not present

## 2018-10-03 DIAGNOSIS — N35919 Unspecified urethral stricture, male, unspecified site: Secondary | ICD-10-CM | POA: Diagnosis not present

## 2018-10-03 DIAGNOSIS — I2510 Atherosclerotic heart disease of native coronary artery without angina pectoris: Secondary | ICD-10-CM | POA: Diagnosis not present

## 2018-10-03 DIAGNOSIS — Z791 Long term (current) use of non-steroidal anti-inflammatories (NSAID): Secondary | ICD-10-CM | POA: Insufficient documentation

## 2018-10-03 DIAGNOSIS — C9111 Chronic lymphocytic leukemia of B-cell type in remission: Secondary | ICD-10-CM | POA: Insufficient documentation

## 2018-10-03 DIAGNOSIS — R310 Gross hematuria: Secondary | ICD-10-CM | POA: Diagnosis not present

## 2018-10-03 DIAGNOSIS — N5201 Erectile dysfunction due to arterial insufficiency: Secondary | ICD-10-CM | POA: Diagnosis not present

## 2018-10-03 DIAGNOSIS — R319 Hematuria, unspecified: Secondary | ICD-10-CM | POA: Diagnosis not present

## 2018-10-03 DIAGNOSIS — Z955 Presence of coronary angioplasty implant and graft: Secondary | ICD-10-CM | POA: Diagnosis not present

## 2018-10-03 DIAGNOSIS — Z79899 Other long term (current) drug therapy: Secondary | ICD-10-CM | POA: Insufficient documentation

## 2018-10-03 DIAGNOSIS — Z7982 Long term (current) use of aspirin: Secondary | ICD-10-CM | POA: Insufficient documentation

## 2018-10-03 DIAGNOSIS — K5090 Crohn's disease, unspecified, without complications: Secondary | ICD-10-CM | POA: Insufficient documentation

## 2018-10-03 DIAGNOSIS — M1710 Unilateral primary osteoarthritis, unspecified knee: Secondary | ICD-10-CM | POA: Diagnosis not present

## 2018-10-03 DIAGNOSIS — N35011 Post-traumatic bulbous urethral stricture: Secondary | ICD-10-CM | POA: Diagnosis not present

## 2018-10-03 DIAGNOSIS — R351 Nocturia: Secondary | ICD-10-CM | POA: Diagnosis not present

## 2018-10-03 DIAGNOSIS — Z87891 Personal history of nicotine dependence: Secondary | ICD-10-CM | POA: Insufficient documentation

## 2018-10-03 DIAGNOSIS — N35912 Unspecified bulbous urethral stricture, male: Secondary | ICD-10-CM | POA: Diagnosis not present

## 2018-10-03 DIAGNOSIS — I10 Essential (primary) hypertension: Secondary | ICD-10-CM | POA: Diagnosis not present

## 2018-10-03 HISTORY — PX: CYSTOSCOPY WITH URETHRAL DILATATION: SHX5125

## 2018-10-03 SURGERY — CYSTOSCOPY, WITH URETHRAL DILATION
Anesthesia: General

## 2018-10-03 MED ORDER — ONDANSETRON HCL 4 MG/2ML IJ SOLN
INTRAMUSCULAR | Status: DC | PRN
  Administered 2018-10-03: 4 mg via INTRAVENOUS

## 2018-10-03 MED ORDER — PROPOFOL 10 MG/ML IV BOLUS
INTRAVENOUS | Status: DC | PRN
  Administered 2018-10-03: 120 mg via INTRAVENOUS

## 2018-10-03 MED ORDER — SODIUM CHLORIDE 0.9 % IV SOLN
INTRAVENOUS | Status: DC | PRN
  Administered 2018-10-03: 12:00:00 15 mL

## 2018-10-03 MED ORDER — LIDOCAINE 2% (20 MG/ML) 5 ML SYRINGE
INTRAMUSCULAR | Status: DC | PRN
  Administered 2018-10-03: 100 mg via INTRAVENOUS

## 2018-10-03 MED ORDER — FENTANYL CITRATE (PF) 100 MCG/2ML IJ SOLN
INTRAMUSCULAR | Status: AC
  Filled 2018-10-03: qty 2

## 2018-10-03 MED ORDER — ONDANSETRON HCL 4 MG/2ML IJ SOLN: 4.0000 mg | Freq: Once | INTRAMUSCULAR | Status: DC | PRN

## 2018-10-03 MED ORDER — PROPOFOL 10 MG/ML IV BOLUS
INTRAVENOUS | Status: AC
  Filled 2018-10-03: qty 20

## 2018-10-03 MED ORDER — CEFAZOLIN SODIUM-DEXTROSE 2-4 GM/100ML-% IV SOLN
INTRAVENOUS | Status: AC
  Filled 2018-10-03: qty 100

## 2018-10-03 MED ORDER — CEFAZOLIN SODIUM-DEXTROSE 2-4 GM/100ML-% IV SOLN
2.0000 g | Freq: Once | INTRAVENOUS | Status: AC
  Administered 2018-10-03: 2 g via INTRAVENOUS

## 2018-10-03 MED ORDER — 0.9 % SODIUM CHLORIDE (POUR BTL) OPTIME
TOPICAL | Status: DC | PRN
  Administered 2018-10-03: 1000 mL

## 2018-10-03 MED ORDER — PHENAZOPYRIDINE HCL 200 MG PO TABS: 200.0000 mg | ORAL_TABLET | Freq: Three times a day (TID) | ORAL | 0 refills | Status: DC | PRN

## 2018-10-03 MED ORDER — HYDROMORPHONE HCL 1 MG/ML IJ SOLN: 0.2500 mg | INTRAMUSCULAR | Status: DC | PRN

## 2018-10-03 MED ORDER — MEPERIDINE HCL 50 MG/ML IJ SOLN: 6.2500 mg | INTRAMUSCULAR | Status: DC | PRN

## 2018-10-03 MED ORDER — MIDAZOLAM HCL 2 MG/2ML IJ SOLN
INTRAMUSCULAR | Status: AC
  Filled 2018-10-03: qty 2

## 2018-10-03 MED ORDER — FENTANYL CITRATE (PF) 100 MCG/2ML IJ SOLN
INTRAMUSCULAR | Status: DC | PRN
  Administered 2018-10-03: 50 ug via INTRAVENOUS

## 2018-10-03 MED ORDER — LACTATED RINGERS IV SOLN
INTRAVENOUS | Status: DC
  Administered 2018-10-03: 10:00:00 via INTRAVENOUS

## 2018-10-03 MED ORDER — STERILE WATER FOR IRRIGATION IR SOLN
Status: DC | PRN
  Administered 2018-10-03: 3000 mL

## 2018-10-03 MED ORDER — DEXAMETHASONE SODIUM PHOSPHATE 10 MG/ML IJ SOLN
INTRAMUSCULAR | Status: DC | PRN
  Administered 2018-10-03: 10 mg via INTRAVENOUS

## 2018-10-03 SURGICAL SUPPLY — 20 items
BAG URINE DRAINAGE (UROLOGICAL SUPPLIES) ×1 IMPLANT
BALLN NEPHROSTOMY (BALLOONS) ×2
BALLOON NEPHROSTOMY (BALLOONS) IMPLANT
CATH FOLEY 2W COUNCIL 20FR 5CC (CATHETERS) IMPLANT
CATH INTERMIT  6FR 70CM (CATHETERS) IMPLANT
CATH ROBINSON RED A/P 14FR (CATHETERS) IMPLANT
CATH URET 5FR 28IN CONE TIP (BALLOONS)
CATH URET 5FR 70CM CONE TIP (BALLOONS) IMPLANT
CLOTH BEACON ORANGE TIMEOUT ST (SAFETY) ×1 IMPLANT
COVER WAND RF STERILE (DRAPES) IMPLANT
GLOVE BIO SURGEON STRL SZ7.5 (GLOVE) ×2 IMPLANT
GOWN STRL REUS W/TWL LRG LVL3 (GOWN DISPOSABLE) ×2 IMPLANT
GUIDEWIRE ANG ZIPWIRE 038X150 (WIRE) IMPLANT
GUIDEWIRE STR DUAL SENSOR (WIRE) ×1 IMPLANT
KIT TURNOVER KIT A (KITS) ×1 IMPLANT
MANIFOLD NEPTUNE II (INSTRUMENTS) ×1 IMPLANT
NS IRRIG 1000ML POUR BTL (IV SOLUTION) ×1 IMPLANT
PACK CYSTO (CUSTOM PROCEDURE TRAY) ×2 IMPLANT
TUBING UROLOGY SET (TUBING) ×1 IMPLANT
WATER STERILE IRR 3000ML UROMA (IV SOLUTION) ×1 IMPLANT

## 2018-10-03 NOTE — Anesthesia Procedure Notes (Signed)
Procedure Name: LMA Insertion Date/Time: 10/03/2018 11:13 AM Performed by: Pilar Grammes, CRNA Pre-anesthesia Checklist: Patient identified, Emergency Drugs available, Suction available, Patient being monitored and Timeout performed Patient Re-evaluated:Patient Re-evaluated prior to induction Oxygen Delivery Method: Circle system utilized Preoxygenation: Pre-oxygenation with 100% oxygen Induction Type: IV induction Ventilation: Mask ventilation without difficulty LMA: LMA inserted and LMA with gastric port inserted LMA Size: 5.0 Tube secured with: Tape

## 2018-10-03 NOTE — Transfer of Care (Signed)
Immediate Anesthesia Transfer of Care Note  Patient: Cole Martinez  Procedure(s) Performed: CYSTOSCOPY WITH URETHRAL BALLOON DILATATION WITH BILATERAL RETROGRADE PYELOGRAPHY (N/A )  Patient Location: PACU  Anesthesia Type:General  Level of Consciousness: awake, alert , oriented and patient cooperative  Airway & Oxygen Therapy: Patient Spontanous Breathing and Patient connected to face mask oxygen  Post-op Assessment: Report given to RN, Post -op Vital signs reviewed and stable and Patient moving all extremities  Post vital signs: Reviewed and stable  Last Vitals:  Vitals Value Taken Time  BP    Temp    Pulse    Resp    SpO2      Last Pain: There were no vitals filed for this visit.    Patients Stated Pain Goal: 2 (94/37/00 5259)  Complications: No apparent anesthesia complications

## 2018-10-03 NOTE — Op Note (Signed)
Preoperative diagnosis: Urethral stricture, gross hematuria  Postoperative diagnosis: Urethral stricture, gross hematuria  Procedure: 1.  Cystoscopy 2.  Balloon dilation of urethral stricture 3.  Bilateral retrograde pyelography with interpretation  Surgeon: Pryor Curia MD  Anesthesia: General  Complications: None  EBL: Minimal  Intraoperative findings: Bilateral retrograde pyelography was performed with a 6 French ureteral catheter and Omnipaque contrast.  This revealed normal caliber bilateral ureters without filling defects or other abnormalities.  No hydronephrosis or renal collecting system filling defects were identified.  Indication: Cole Martinez is a 73 year old gentleman with a history of BPH and a urethral stricture.  He has undergone a prior transurethral resection of the prostate.  He recently developed gross hematuria and an attempt at office cystoscopy was unable to be completed due to a bulbar urethral stricture.  As such, he was recommended to undergo balloon dilation of his urethral stricture so that his cystoscopy could be completed for full evaluation of his hematuria.  The potential risks, complications, and the alternative options associated with the above procedures were discussed in detail.  Informed consent was obtained.  Description of procedure: The patient was taken the operating room and a general anesthetic was administered.  He was given preoperative antibiotics, placed in the dorsolithotomy position, and prepped and draped in the usual sterile fashion.  Next a preoperative timeout was performed.  Cystourethroscopy was then performed with a 22 French cystoscope sheath.  This revealed the bulbar urethral stricture as previously noted.  A 0.38 sensor guidewire was then advanced through the stricture and curled in the bladder under fluoroscopic guidance.  The 24 French Ultraxx balloon was then inserted over the wire and across the stricture, again positioning  under fluoroscopic guidance.  The balloon was then inflated to 14 mmHg pressure and left inflated for 5 minutes.  The balloon was then deflated and removed.  Cystoscopy revealed the stricture to be widely patent.  Inspection of the bladder was then performed with a 30 and 70 degree lens.  No bladder tumors, stones, or other mucosal pathology was identified.  There was noted to be some mild benign prostatic hyperplasia regrowth toward the left posterior bladder that appeared to be friable and a likely source for his recent hematuria.  Bilateral retrograde pyelography was then performed as stated above.  No upper tract abnormalities were identified.  The patient's bladder was then emptied.  He tolerated the procedure well without complications.  He was able to be awakened and transferred to recovery unit in satisfactory condition.

## 2018-10-03 NOTE — Anesthesia Postprocedure Evaluation (Signed)
Anesthesia Post Note  Patient: Cole Martinez  Procedure(s) Performed: CYSTOSCOPY WITH URETHRAL BALLOON DILATATION WITH BILATERAL RETROGRADE PYELOGRAPHY (N/A )     Patient location during evaluation: PACU Anesthesia Type: General Level of consciousness: awake and alert Pain management: pain level controlled Vital Signs Assessment: post-procedure vital signs reviewed and stable Respiratory status: spontaneous breathing, nonlabored ventilation, respiratory function stable and patient connected to nasal cannula oxygen Cardiovascular status: blood pressure returned to baseline and stable Postop Assessment: no apparent nausea or vomiting Anesthetic complications: no    Last Vitals:  Vitals:   10/03/18 1245 10/03/18 1256  BP: (!) 152/86 (!) 168/84  Pulse: 77 66  Resp: 16 16  Temp:    SpO2: 100% 100%    Last Pain:  Vitals:   10/03/18 1256  PainSc: 0-No pain                 Janiece Scovill DAVID

## 2018-10-03 NOTE — Discharge Instructions (Signed)
1. You may see some blood in the urine and may have some burning with urination for 48-72 hours. You also may notice that you have to urinate more frequently or urgently after your procedure which is normal.  2. You should call should you develop an inability urinate, fever > 101, persistent nausea and vomiting that prevents you from eating or drinking to stay hydrated.        3.   You may resume aspirin or anti-inflammatory medication (such as ibuprofen) in one week.

## 2018-10-04 ENCOUNTER — Encounter (HOSPITAL_COMMUNITY): Payer: Self-pay | Admitting: Urology

## 2018-10-04 MED FILL — AMPHETAMINE-DEXTROAMPHETAMI: 5 | 30 days supply | Qty: 60 | Fill #0

## 2018-10-23 DIAGNOSIS — N35011 Post-traumatic bulbous urethral stricture: Secondary | ICD-10-CM | POA: Diagnosis not present

## 2018-10-23 DIAGNOSIS — R351 Nocturia: Secondary | ICD-10-CM | POA: Diagnosis not present

## 2018-10-23 DIAGNOSIS — N401 Enlarged prostate with lower urinary tract symptoms: Secondary | ICD-10-CM | POA: Diagnosis not present

## 2018-10-23 DIAGNOSIS — R972 Elevated prostate specific antigen [PSA]: Secondary | ICD-10-CM | POA: Diagnosis not present

## 2018-10-30 ENCOUNTER — Other Ambulatory Visit: Payer: Self-pay | Admitting: Internal Medicine

## 2018-10-30 DIAGNOSIS — M8580 Other specified disorders of bone density and structure, unspecified site: Secondary | ICD-10-CM

## 2018-10-30 DIAGNOSIS — E230 Hypopituitarism: Secondary | ICD-10-CM

## 2018-11-01 ENCOUNTER — Other Ambulatory Visit: Payer: Self-pay

## 2018-11-01 ENCOUNTER — Ambulatory Visit
Admission: RE | Admit: 2018-11-01 | Discharge: 2018-11-01 | Disposition: A | Discharge status: Home / Self Care | Payer: Medicare Other | Source: Ambulatory Visit | Attending: Internal Medicine | Admitting: Internal Medicine

## 2018-11-01 DIAGNOSIS — M85851 Other specified disorders of bone density and structure, right thigh: Secondary | ICD-10-CM | POA: Diagnosis not present

## 2018-11-01 DIAGNOSIS — M85831 Other specified disorders of bone density and structure, right forearm: Secondary | ICD-10-CM | POA: Diagnosis not present

## 2018-11-01 DIAGNOSIS — M8580 Other specified disorders of bone density and structure, unspecified site: Secondary | ICD-10-CM

## 2018-11-01 DIAGNOSIS — E230 Hypopituitarism: Secondary | ICD-10-CM

## 2018-11-05 ENCOUNTER — Ambulatory Visit: Payer: Medicare Other | Admitting: Podiatry

## 2018-11-06 ENCOUNTER — Other Ambulatory Visit: Payer: Self-pay

## 2018-11-06 ENCOUNTER — Encounter: Payer: Self-pay | Admitting: Podiatry

## 2018-11-06 ENCOUNTER — Ambulatory Visit (INDEPENDENT_AMBULATORY_CARE_PROVIDER_SITE_OTHER): Payer: Medicare Other | Admitting: Podiatry

## 2018-11-06 DIAGNOSIS — L84 Corns and callosities: Secondary | ICD-10-CM | POA: Diagnosis not present

## 2018-11-06 DIAGNOSIS — M79672 Pain in left foot: Secondary | ICD-10-CM

## 2018-11-06 NOTE — Patient Instructions (Addendum)
Athlete's Foot  Athlete's foot (tinea pedis) is a fungal infection of the skin on your feet. It often occurs on the skin that is between or underneath the toes. It can also occur on the soles of your feet. The infection can spread from person to person (is contagious). It can also spread when a person's bare feet come in contact with the fungus on shower floors or on items such as shoes. What are the causes? This condition is caused by a fungus that grows in warm, moist places. You can get athlete's foot by sharing shoes, shower stalls, towels, and wet floors with someone who is infected. Not washing your feet or changing your socks often enough can also lead to athlete's foot. What increases the risk? This condition is more likely to develop in:  Men.  People who have a weak body defense system (immune system).  People who have diabetes.  People who use public showers, such as at a gym.  People who wear heavy-duty shoes, such as Environmental manager.  Seasons with warm, humid weather. What are the signs or symptoms? Symptoms of this condition include:  Itchy areas between your toes or on the soles of your feet.  White, flaky, or scaly areas between your toes or on the soles of your feet.  Very itchy small blisters between your toes or on the soles of your feet.  Small cuts in your skin. These cuts can become infected.  Thick or discolored toenails. How is this diagnosed? This condition may be diagnosed with a physical exam and a review of your medical history. Your health care provider may also take a skin or toenail sample to examine under a microscope. How is this treated? This condition is treated with antifungal medicines. These may be applied as powders, ointments, or creams. In severe cases, an oral antifungal medicine may be given. Follow these instructions at home: Medicines  Apply or take over-the-counter and prescription medicines only as told by your health  care provider.  Apply your antifungal medicine as told by your health care provider. Do not stop using the antifungal even if your condition improves. Foot care  Do not scratch your feet.  Keep your feet dry: ? Wear cotton or wool socks. Change your socks every day or if they become wet. ? Wear shoes that allow air to flow, such as sandals or canvas tennis shoes.  Wash and dry your feet, including the area between your toes. Also, wash and dry your feet: ? Every day or as told by your health care provider. ? After exercising. General instructions  Do not let others use towels, shoes, nail clippers, or other personal items that touch your feet.  Protect your feet by wearing sandals in wet areas, such as locker rooms and shared showers.  Keep all follow-up visits as told by your health care provider. This is important.  If you have diabetes, keep your blood sugar under control. Contact a health care provider if:  You have a fever.  You have swelling, soreness, warmth, or redness in your foot.  Your feet are not getting better with treatment.  Your symptoms get worse.  You have new symptoms. Summary  Athlete's foot (tinea pedis) is a fungal infection of the skin on your feet. It often occurs on skin that is between or underneath the toes.  This condition is caused by a fungus that grows in warm, moist places.  Symptoms include white, flaky, or scaly areas between  your toes or on the soles of your feet.  This condition is treated with antifungal medicines.  Keep your feet clean. Always dry them thoroughly. This information is not intended to replace advice given to you by your health care provider. Make sure you discuss any questions you have with your health care provider. Document Released: 02/18/2000 Document Revised: 02/15/2017 Document Reviewed: 12/11/2016 Elsevier Patient Education  [place]? An infection  that lies within the keratin of your nail plate that is caused by a fungus.  WHY ME? Fungal infections affect all ages, sexes, races, and creeds.  There may be many factors that predispose you to a fungal infection such as age, coexisting medical conditions such as diabetes, or an autoimmune disease; stress, medications, fatigue, genetics, etc.  Bottom line: fungus thrives in a warm, moist environment and your shoes offer such a location.  IS IT CONTAGIOUS? Theoretically, yes.  You do not want to share shoes, nail clippers or files with someone who has fungal toenails.  Walking around barefoot in the same room or sleeping in the same bed is unlikely to transfer the organism.  It is important to realize, however, that fungus can spread easily from one nail to the next on the same foot.  HOW DO WE TREAT THIS?  There are several ways to treat this condition.  Treatment may depend on many factors such as age, medications, pregnancy, liver and kidney conditions, etc.  It is best to ask your doctor which options are available to you.  1. No treatment.   Unlike many other medical concerns, you can live with this condition.  However for many people this can be a painful condition and may lead to ingrown toenails or a bacterial infection.  It is recommended that you keep the nails cut short to help reduce the amount of fungal nail. 2. Topical treatment.  These range from herbal remedies to prescription strength nail lacquers.  About 40-50% effective, topicals require twice daily application for approximately 9 to 12 months or until an entirely new nail has grown out.  The most effective topicals are medical grade medications available through physicians offices. 3. Oral antifungal medications.  With an 80-90% cure rate, the most common oral medication requires 3 to 4 months of therapy and stays in your system for a year as the new nail grows out.  Oral antifungal medications do require blood work to make sure it is  a safe drug for you.  A liver function panel will be performed prior to starting the medication and after the first month of treatment.  It is important to have the blood work performed to avoid any harmful side effects.  In general, this medication safe but blood work is required. 4. Laser Therapy.  This treatment is performed by applying a specialized laser to the affected nail plate.  This therapy is noninvasive, fast, and non-painful.  It is not covered by insurance and is therefore, out of pocket.  The results have been very good with a 80-95% cure rate.  [facility] is the only practice in the area to offer this therapy. 5. Permanent Nail Avulsion.  Removing the entire nail so that a new nail will not grow back.

## 2018-11-12 NOTE — Progress Notes (Signed)
Subjective: Cole Martinez presents to clinic with cc of calluses left foot which are aggravated when weightbearing with and without shoe gear.  This pain limits his daily activities. Pain symptoms resolve with periodic professional debridement.   Current Outpatient Medications:  .  amLODipine (NORVASC) 2.5 MG tablet, Take 1 tablet (2.5 mg total) by mouth daily., Disp: 90 tablet, Rfl: 3 .  amphetamine-dextroamphetamine (ADDERALL) 5 MG tablet, Take 5 mg by mouth 2 (two) times daily., Disp: , Rfl:  .  aspirin EC 81 MG tablet, Take by mouth., Disp: , Rfl:  .  atorvastatin (LIPITOR) 20 MG tablet, TAKE 1 TABLET BY MOUTH  EVERY MORNING. (Patient taking differently: Take 20 mg by mouth every other day. ), Disp: 90 tablet, Rfl: 2 .  celecoxib (CELEBREX) 200 MG capsule, , Disp: , Rfl:  .  Cholecalciferol (VITAMIN D3) 50 MCG (2000 UT) capsule, Take 2,000 Units by mouth daily. , Disp: , Rfl:  .  Coenzyme Q10 (CO Q-10) 200 MG CAPS, Take 200 mg by mouth daily. , Disp: , Rfl:  .  Cyanocobalamin (VITAMIN B-12) 2500 MCG SUBL, Place 2,500 mcg under the tongue daily., Disp: , Rfl:  .  omeprazole (PRILOSEC OTC) 20 MG tablet, Take 20 mg by mouth daily., Disp: , Rfl:  .  omeprazole (PRILOSEC) 20 MG capsule, Take by mouth., Disp: , Rfl:  .  phenazopyridine (PYRIDIUM) 200 MG tablet, Take 1 tablet (200 mg total) by mouth 3 (three) times daily as needed for pain., Disp: 20 tablet, Rfl: 0 .  tadalafil (CIALIS) 5 MG tablet, Take 5 mg by mouth daily., Disp: , Rfl:    No Known Allergies   Objective: There were no vitals filed for this visit.  Physical Examination:  Vascular  Examination: Capillary refill time immediate x 10 digits.  Palpable DP/PT pulses b/l.  Digital hair present b/l.  No edema noted b/l.  Skin temperature gradient WNL b/l.  Dermatological Examination: Skin with normal turgor, texture and tone b/l.  No open wounds b/l.  No interdigital macerations noted b/l.  Toenails 1-5 b/l  recently trimmed with adequate length.  Hyperkeratotic lesion submet heads 2, 5 left foot with tenderness to palpation. No edema, no erythema, no drainage, no flocculence.  Musculoskeletal Examination: Muscle strength 5/5 to all muscle groups b/l.  Hammertoes left foot.  No pain, crepitus or joint discomfort with active/passive ROM.  Neurological Examination: Sensation intact 5/5 b/l with 10 gram monofilament.  Vibratory sensation intact b/l.  Proprioceptive sensation intact b/l.  Assessment: 1. Callus submet head 2, 5 left foot 2. Pain in left foot  Plan: Calluses pared submetatarsal head(s) 2, 5 left foot utilizing sterile scalpel blade without incident. Continue soft, supportive shoe gear daily. Report any pedal injuries to medical professional. Follow up 3 months. Patient/POA to call should there be a question/concern in there interim.

## 2018-11-21 DIAGNOSIS — Z23 Encounter for immunization: Secondary | ICD-10-CM | POA: Diagnosis not present

## 2018-11-22 DIAGNOSIS — N400 Enlarged prostate without lower urinary tract symptoms: Secondary | ICD-10-CM | POA: Diagnosis not present

## 2018-11-22 DIAGNOSIS — N529 Male erectile dysfunction, unspecified: Secondary | ICD-10-CM | POA: Diagnosis not present

## 2018-11-22 DIAGNOSIS — M109 Gout, unspecified: Secondary | ICD-10-CM | POA: Diagnosis not present

## 2018-11-22 DIAGNOSIS — E559 Vitamin D deficiency, unspecified: Secondary | ICD-10-CM | POA: Diagnosis not present

## 2018-11-22 DIAGNOSIS — R259 Unspecified abnormal involuntary movements: Secondary | ICD-10-CM | POA: Diagnosis not present

## 2018-11-22 DIAGNOSIS — I2510 Atherosclerotic heart disease of native coronary artery without angina pectoris: Secondary | ICD-10-CM | POA: Diagnosis not present

## 2018-11-22 DIAGNOSIS — Z0001 Encounter for general adult medical examination with abnormal findings: Secondary | ICD-10-CM | POA: Diagnosis not present

## 2018-11-22 DIAGNOSIS — R12 Heartburn: Secondary | ICD-10-CM | POA: Diagnosis not present

## 2018-11-22 DIAGNOSIS — Z1389 Encounter for screening for other disorder: Secondary | ICD-10-CM | POA: Diagnosis not present

## 2018-11-22 DIAGNOSIS — I10 Essential (primary) hypertension: Secondary | ICD-10-CM | POA: Diagnosis not present

## 2018-11-22 DIAGNOSIS — R7309 Other abnormal glucose: Secondary | ICD-10-CM | POA: Diagnosis not present

## 2018-11-22 DIAGNOSIS — F900 Attention-deficit hyperactivity disorder, predominantly inattentive type: Secondary | ICD-10-CM | POA: Diagnosis not present

## 2018-12-09 ENCOUNTER — Inpatient Hospital Stay: Payer: Medicare Other | Attending: Oncology | Admitting: Oncology

## 2018-12-09 ENCOUNTER — Other Ambulatory Visit: Payer: Self-pay

## 2018-12-09 ENCOUNTER — Inpatient Hospital Stay: Payer: Medicare Other

## 2018-12-09 VITALS — BP 142/64 | HR 73 | Temp 98.0°F | Resp 17 | Ht 67.0 in | Wt 181.3 lb

## 2018-12-09 DIAGNOSIS — C9110 Chronic lymphocytic leukemia of B-cell type not having achieved remission: Secondary | ICD-10-CM

## 2018-12-09 DIAGNOSIS — I2510 Atherosclerotic heart disease of native coronary artery without angina pectoris: Secondary | ICD-10-CM

## 2018-12-09 DIAGNOSIS — Z856 Personal history of leukemia: Secondary | ICD-10-CM | POA: Insufficient documentation

## 2018-12-09 LAB — CBC WITH DIFFERENTIAL (CANCER CENTER ONLY)
Abs Immature Granulocytes: 0.02 10*3/uL (ref 0.00–0.07)
Basophils Absolute: 0.1 10*3/uL (ref 0.0–0.1)
Basophils Relative: 1 %
Eosinophils Absolute: 0.3 10*3/uL (ref 0.0–0.5)
Eosinophils Relative: 5 %
HCT: 46.4 % (ref 39.0–52.0)
Hemoglobin: 15.5 g/dL (ref 13.0–17.0)
Immature Granulocytes: 0 %
Lymphocytes Relative: 21 %
Lymphs Abs: 1.5 10*3/uL (ref 0.7–4.0)
MCH: 30 pg (ref 26.0–34.0)
MCHC: 33.4 g/dL (ref 30.0–36.0)
MCV: 89.9 fL (ref 80.0–100.0)
Monocytes Absolute: 0.8 10*3/uL (ref 0.1–1.0)
Monocytes Relative: 11 %
Neutro Abs: 4.5 10*3/uL (ref 1.7–7.7)
Neutrophils Relative %: 62 %
Platelet Count: 234 10*3/uL (ref 150–400)
RBC: 5.16 MIL/uL (ref 4.22–5.81)
RDW: 13.4 % (ref 11.5–15.5)
WBC Count: 7.2 10*3/uL (ref 4.0–10.5)
nRBC: 0 % (ref 0.0–0.2)

## 2018-12-09 NOTE — Progress Notes (Signed)
  [facility] OFFICE PROGRESS NOTE   Diagnosis: CLL  INTERVAL HISTORY:   Cole Martinez returns as scheduled but he feels well.  Good appetite and energy level.  No fever or recent infection.  He underwent dilation of a urethral stricture on 10/03/2018.  Objective:  Vital signs in last 24 hours:  Blood pressure (!) 165/88, pulse 73, temperature 98 F (36.7 C), temperature source Temporal, resp. rate 17, height 5' 7"  (1.702 m), weight 181 lb 4.8 oz (82.2 kg), SpO2 100 %.    HEENT: Neck without mass Lymphatics: No cervical, supraclavicular, axillary, or inguinal nodes GI: No hepatosplenomegaly, no mass Vascular: No leg edema  Lab Results:  Lab Results  Component Value Date   WBC 7.2 12/09/2018   HGB 15.5 12/09/2018   HCT 46.4 12/09/2018   MCV 89.9 12/09/2018   PLT 234 12/09/2018   NEUTROABS 4.5 12/09/2018    CMP  Lab Results  Component Value Date   NA 139 10/02/2018   K 4.7 10/02/2018   CL 104 10/02/2018   CO2 27 10/02/2018   GLUCOSE 105 (H) 10/02/2018   BUN 21 10/02/2018   CREATININE 0.94 10/02/2018   CALCIUM 8.5 (L) 10/02/2018   PROT 7.3 03/20/2017   ALBUMIN 3.9 03/20/2017   AST 28 03/20/2017   ALT 29 03/20/2017   ALKPHOS 75 03/20/2017   BILITOT 1.7 (H) 03/20/2017   GFRNONAA >60 10/02/2018   GFRAA >60 10/02/2018     Medications: I have reviewed the patient's current medications.   Assessment/Plan: 1. Chronic lymphocytic leukemia, diagnosed in 1996. He remains asymptomatic and stable from a hematologic standpoint. 2. pneumococcal vaccine given on 11/30/2015 , 13 valent pneumonia vaccine given 11/13/2013    Disposition: Cole Martinez is stable from a hematologic standpoint.  No clinical or laboratory evidence for progression of CLL.  He is up-to-date on influenza vaccines.  He would like to continue follow-up in the hematology clinic.  He will return for office visit in 1 year  Betsy Coder, MD  12/09/2018  11:55 AM

## 2018-12-10 ENCOUNTER — Telehealth: Payer: Self-pay | Admitting: Oncology

## 2018-12-10 NOTE — Telephone Encounter (Signed)
Scheduled per los. Called and left msg. Mailed printout  °

## 2018-12-23 ENCOUNTER — Other Ambulatory Visit: Payer: Self-pay | Admitting: Interventional Cardiology

## 2018-12-27 MED FILL — AMPHETAMINE-DEXTROAMPHETAMI: 5 | 30 days supply | Qty: 60 | Fill #0

## 2019-01-22 ENCOUNTER — Other Ambulatory Visit: Payer: Self-pay

## 2019-01-22 ENCOUNTER — Ambulatory Visit (INDEPENDENT_AMBULATORY_CARE_PROVIDER_SITE_OTHER): Payer: Medicare Other | Admitting: Physician Assistant

## 2019-01-22 ENCOUNTER — Telehealth: Payer: Self-pay | Admitting: Interventional Cardiology

## 2019-01-22 ENCOUNTER — Encounter: Payer: Self-pay | Admitting: *Deleted

## 2019-01-22 ENCOUNTER — Telehealth (HOSPITAL_COMMUNITY): Payer: Self-pay

## 2019-01-22 ENCOUNTER — Encounter: Payer: Self-pay | Admitting: Physician Assistant

## 2019-01-22 VITALS — BP 148/88 | HR 73 | Ht 67.0 in | Wt 181.0 lb

## 2019-01-22 DIAGNOSIS — I25119 Atherosclerotic heart disease of native coronary artery with unspecified angina pectoris: Secondary | ICD-10-CM

## 2019-01-22 DIAGNOSIS — R079 Chest pain, unspecified: Secondary | ICD-10-CM | POA: Diagnosis not present

## 2019-01-22 DIAGNOSIS — E782 Mixed hyperlipidemia: Secondary | ICD-10-CM | POA: Diagnosis not present

## 2019-01-22 DIAGNOSIS — I10 Essential (primary) hypertension: Secondary | ICD-10-CM

## 2019-01-22 MED ORDER — METOPROLOL SUCCINATE ER 25 MG PO TB24: 25.0000 mg | ORAL_TABLET | Freq: Every day | ORAL | 3 refills | Status: DC

## 2019-01-22 NOTE — Progress Notes (Signed)
Cardiology Office Note:    Date:  01/22/2019   ID:  Cole Martinez, DOB 1945-03-26, MRN [id number]  PCP:  Cole Huddle, MD  Cardiologist:  Cole Grooms, MD   Electrophysiologist:  None  Oncologist: Dr. Benay Martinez  Referring MD: Cole Huddle, MD   Chief Complaint  Patient presents with  . Chest Pain     History of Present Illness:    Cole Martinez is a 73 y.o. male with:   Coronary artery disease   S/p BMS to LAD 1999  Myoview 10/18: no ischemia, EF 57  Hyperlipidemia   Hypertension   Obesity  S/p R TKR 03/2017   Chronic Lymphocytic Leukemia   BPH   Crohn's Dz   Essential Tremor   Cole Martinez was last seen by Dr. Tamala Martinez in 03/2018.  He called in today with complaints of exertional chest pressure and shortness of breath.  He is added on for further evaluation.  He walks 45-60 minutes a day.  Over the past 1-2 months, he has noticed tightness in his chest with walking up a hill on his walk.  He has not had rest pain and his symptoms go away quickly with rest.  He has assoc shortness of breath but no radiating symptoms or nausea.  He does not have NTG to take and he is on daily PDE-5 inhibitors.     Prior CV studies:   The following studies were reviewed today:  Myoview 12/18/2016  Nuclear stress EF: 57%.  Blood pressure demonstrated a normal response to exercise.  There was no ST segment deviation noted during stress.  The study is normal.  This is a low risk study.  The left ventricular ejection fraction is normal (55-65%). Normal resting and stress perfusion. No ischemia or infarction EF 57%  AAA [place] 07/21/2011 No evidence of AAA  Past Medical History:  Diagnosis Date  . ADHD (attention deficit hyperactivity disorder)   . B12 deficiency   . BPH (benign prostatic hypertrophy)   . Chronic lymphocytic leukemia (CLL), T-cell (Cole Martinez) DX 1996--  ONCOLOGIST-  DR Cole Martinez   PT IS ASYMPTOMATIC--- LAST CBC W/ DIFF 06-24-2012 STABLE  . Coronary artery  disease CARDIOLOGIST- DR Cole Martinez   S/P STENTING LAD 1999  . Crohn's disease of ileum ([place]) SINCE 1988  . ED (erectile dysfunction)   . Elevated PSA   . Essential and other specified forms of tremor 11/25/2012  . H/O adenomatous polyp of colon   . Hyperlipidemia   . Nocturia   . OA (osteoarthritis)   . Peripheral neuropathy    hx of, none current as of 08-04-13  . Peyronie disease   . S/P coronary artery stent placement OCT 1999 OF LAD  . Tremor, hereditary, benign MILD RIGHT HAND   Surgical Hx: The patient  has a past surgical history that includes Coronary angioplasty with stent ([place]); Laparoscopic inguinal hernia repair (Bilateral, 01-10-2004); REMOVAL LEFT NECK LYMPH NODE (1996); Prostate surgery (2002); Tonsillectomy (CHILD); Cardiovascular stress test (11-01-2010 DR Cole Martinez); Prostate biopsy (N/A, 07/12/2012); neck benign removed from neck (yrs ago); Colonoscopy with propofol (N/A, 09/24/2012); Transurethral resection of prostate (N/A, 08/08/2013); Transurethral resection of prostate; Total knee arthroplasty (Right, 03/12/2017); Cystoscopy with urethral dilatation (03/12/2017); cataract surgery  (Bilateral, feburary 2020); and Cystoscopy with urethral dilatation (N/A, 10/03/2018).   Current Medications: Current Meds  Medication Sig  . calcium carbonate (OSCAL) 1500 (600 Ca) MG TABS tablet Take by mouth daily with breakfast.     Allergies:  Patient has no known allergies.   Social History   Tobacco Use  . Smoking status: Former Smoker    Packs/day: 1.00    Years: 10.00    Pack years: 10.00    Types: Cigarettes    Quit date: 07/09/1984    Years since quitting: 34.5  . Smokeless tobacco: Never Used  Substance Use Topics  . Alcohol use: Yes    Alcohol/week: 2.0 standard drinks    Types: 2 Standard drinks or equivalent per week    Comment: daily 1 per day ,   . Drug use: No     Family Hx: The patient's family history includes Diabetes in his brother; Obesity in his  brother; Parkinsonism in his brother.  ROS:   Please see the history of present illness.    ROS All other systems reviewed and are negative.   EKGs/Labs/Other Test Reviewed:    EKG:  EKG is   ordered today.  The ekg ordered today demonstrates normal sinus rhythm, HR 73, normal axis, non-specific ST-TW changes, QTc 445, no changes.   Recent Labs: 10/02/2018: BUN 21; Creatinine, Ser 0.94; Potassium 4.7; Sodium 139 12/09/2018: Hemoglobin 15.5; Platelet Count 234   Recent Lipid Panel Lab Results  Component Value Date/Time   CHOL 154 12/14/2015 08:06 AM   TRIG 118 12/14/2015 08:06 AM   HDL 47 12/14/2015 08:06 AM   CHOLHDL 3.3 12/14/2015 08:06 AM   LDLCALC 83 12/14/2015 08:06 AM    Physical Exam:    VS:  BP (!) 148/88   Pulse 73   Ht 5' 7"  (1.702 m)   Wt 181 lb (82.1 kg)   BMI 28.35 kg/m     Wt Readings from Last 3 Encounters:  01/22/19 181 lb (82.1 kg)  12/09/18 181 lb 4.8 oz (82.2 kg)  10/03/18 178 lb 9.6 oz (81 kg)     Physical Exam  Constitutional: He is oriented to person, place, and time. He appears well-developed and well-nourished. No distress.  HENT:  Head: Normocephalic and atraumatic.  Eyes: No scleral icterus.  Neck: No JVD present. No thyromegaly present.  Cardiovascular: Normal rate, regular rhythm and normal heart sounds.  No murmur heard. Pulmonary/Chest: Effort normal and breath sounds normal. He has no rales.  Abdominal: Soft. There is no hepatomegaly.  Musculoskeletal:        General: No edema.  Lymphadenopathy:    He has no cervical adenopathy.  Neurological: He is alert and oriented to person, place, and time.  Skin: Skin is warm and dry.  Psychiatric: He has a normal mood and affect.    ASSESSMENT & PLAN:    1. Coronary artery disease involving native coronary artery of native heart with angina pectoris ([place]) Hx of BMS to the LAD in 1999.  He had a low risk Myoview in 2018.  He presents with CCS class 2 angina.  His ECG is unchanged.  We  discussed the rationale for attempting optimal medical therapy vs proceeding directly to cardiac catheterization.  He has an essential tremor and may have secondary benefit with starting on a beta-blocker.  I reviewed his case with Dr. Tamala Martinez who also spoke with the patient.  -Toprol XL 25 mg once daily   -Continue Amlodipine, Atorvastatin  -No NTG due to daily PDE-5 inhibitor use  -He knows to go to the ED if he has worse or rest chest pain   -Arrange Lexiscan Myoview to assess for large area of ischemia   -Close follow up after Myoview with  Dr. Tamala Martinez or me in 2-4 weeks.   2. Essential hypertension BP somewhat elevated.  Add Toprol XL as noted.  Continue Amlodipine.    3. Mixed hyperlipidemia Continue statin Rx.     Dispo:  Return in about 3 weeks (around 02/12/2019) for Follow up after testing w/ Dr. Tamala Martinez, or Richardson Dopp, PA-C.   Medication Adjustments/Labs and Tests Ordered: Current medicines are reviewed at length with the patient today.  Concerns regarding medicines are outlined above.  Tests Ordered: Orders Placed This Encounter  Procedures  . Myocardial Perfusion Imaging   Medication Changes: Meds ordered this encounter  Medications  . metoprolol succinate (TOPROL-XL) 25 MG 24 hr tablet    Sig: Take 1 tablet (25 mg total) by mouth daily.    Dispense:  90 tablet    Refill:  3    Signed, Richardson Dopp, PA-C  01/22/2019 1:09 PM    [facility] [address], [place], [place]  [address] Phone: [phone]; Fax: [phone]

## 2019-01-22 NOTE — Telephone Encounter (Signed)
Pt states he has been having DOE for awhile now but over the last 2 weeks has developed chest pressure when taking his 2 mile walks in the morning.  Doesn't occur every morning.  Resolves quickly with rest.  Denies Covid sx and no breathing issues when not exerting.  HR usually in the 70s but doesn't monitor BP.  Denies dizziness, lightheadedness, HA or blurred vision when having chest tightness.  Does have increased sweating when it is occurring.  Spoke with Dr. Tamala Julian and he agreed that pt should be seen today if possible.  Scheduled pt to see Richardson Dopp, PA-C at 12:15P.  Pt appreciative for call.

## 2019-01-22 NOTE — Telephone Encounter (Signed)
° ° °  Pt c/o of Chest Pain: STAT if CP now or developed within 24 hours  1. Are you having CP right now? NO  2. Are you experiencing any other symptoms (ex. SOB, nausea, vomiting, sweating)? SOB when walking  3. How long have you been experiencing CP? 2 weeks  4. Is your CP continuous or coming and going? Coming and going  5. Have you taken Nitroglycerin? no ?

## 2019-01-22 NOTE — Patient Instructions (Addendum)
Your physician has recommended you make the following change in your medication:  START METOPROLOL 25 MG EVERY DAY      Your physician has requested that you have a lexiscan myoview. For further information please visit [email or web address]. Please follow instruction sheet, as given.    Your physician recommends that you schedule a follow-up appointment in:  2-4 [place] VIRTUAL VISIT

## 2019-01-22 NOTE — Addendum Note (Signed)
Addended by: Devra Dopp E on: 01/22/2019 01:27 PM   Modules accepted: Orders

## 2019-01-22 NOTE — Telephone Encounter (Signed)
Spoke with the patient, instructions were given. He stated that he would be here for his test. Asked to call back with any questions. S.Christpoher Sievers EMTP

## 2019-01-24 ENCOUNTER — Encounter: Payer: Self-pay | Admitting: Physician Assistant

## 2019-01-24 ENCOUNTER — Other Ambulatory Visit: Payer: Self-pay

## 2019-01-24 ENCOUNTER — Ambulatory Visit (HOSPITAL_COMMUNITY): Payer: Medicare Other | Attending: Cardiovascular Disease

## 2019-01-24 DIAGNOSIS — I25119 Atherosclerotic heart disease of native coronary artery with unspecified angina pectoris: Secondary | ICD-10-CM | POA: Diagnosis not present

## 2019-01-24 LAB — MYOCARDIAL PERFUSION IMAGING
LV dias vol: 103 mL (ref 62–150)
LV sys vol: 39 mL
Peak HR: 70 {beats}/min
Rest HR: 52 {beats}/min
SDS: 6
SRS: 0
SSS: 6
TID: 1.1

## 2019-01-24 MED ORDER — TECHNETIUM TC 99M TETROFOSMIN IV KIT
31.6000 | PACK | Freq: Once | INTRAVENOUS | Status: AC | PRN
  Administered 2019-01-24: 31.6 via INTRAVENOUS
  Filled 2019-01-24: qty 32

## 2019-01-24 MED ORDER — REGADENOSON 0.4 MG/5ML IV SOLN
0.4000 mg | Freq: Once | INTRAVENOUS | Status: AC
  Administered 2019-01-24: 0.4 mg via INTRAVENOUS

## 2019-01-24 MED ORDER — TECHNETIUM TC 99M TETROFOSMIN IV KIT
10.2000 | PACK | Freq: Once | INTRAVENOUS | Status: AC | PRN
  Administered 2019-01-24: 10.2 via INTRAVENOUS
  Filled 2019-01-24: qty 11

## 2019-02-06 NOTE — Progress Notes (Signed)
Cardiology Office Note:    Date:  02/07/2019   ID:  Cole [facility], DOB 1945-11-18, MRN [id number]  PCP:  Cole Huddle, MD  Cardiologist:  Cole Grooms, MD   Referring MD: Cole Huddle, MD   Chief Complaint  Patient presents with  . Coronary Artery Disease    History of Present Illness:    Cole Martinez is a 73 y.o. male with a hx of remote CAD and PCI, hyperlipidemia, hypertension, and obesity. Recent recurrent angina returning today to reassess after medication titration. May need cath.  Still having chest discomfort.  It occurs with activity.  It goes away with rest.  Perhaps slightly better since starting metoprolol.  Myocardial perfusion imaging was normal recently.  He still feels short of breath when he walks.  His blood pressure is much better.  Past Medical History:  Diagnosis Date  . ADHD (attention deficit hyperactivity disorder)   . B12 deficiency   . BPH (benign prostatic hypertrophy)   . Chronic lymphocytic leukemia (CLL), T-cell (Cole [facility]) DX 1996--  ONCOLOGIST-  DR Cole Martinez   PT IS ASYMPTOMATIC--- LAST CBC W/ DIFF 06-24-2012 STABLE  . Coronary artery disease CARDIOLOGIST- DR Cole Martinez   S/P STENTING LAD 1999 // Myoview 01/2019: EF 62, normal perfusion; Low Risk  . Crohn's disease of ileum (Cole Martinez) SINCE 1988  . ED (erectile dysfunction)   . Elevated PSA   . Essential and other specified forms of tremor 11/25/2012  . H/O adenomatous polyp of colon   . Hyperlipidemia   . Nocturia   . OA (osteoarthritis)   . Peripheral neuropathy    hx of, none current as of 08-04-13  . Peyronie disease   . S/P coronary artery stent placement OCT 1999 OF LAD  . Tremor, hereditary, benign MILD RIGHT Martinez    Past Surgical History:  Procedure Laterality Date  . CARDIOVASCULAR STRESS TEST  11-01-2010 DR [facility]   NORMAL PERFUSION STUDY/ EF 64%/ NO ISCHEMIA  . cataract surgery  Bilateral feburary 2020   with lens placement   . COLONOSCOPY WITH PROPOFOL N/A 09/24/2012   Procedure: COLONOSCOPY WITH PROPOFOL;  Surgeon: Cole Fair, MD;  Location: [facility];  Service: Endoscopy;  Laterality: N/A;  . CORONARY ANGIOPLASTY WITH STENT PLACEMENT  OCT 1999   STENT OF LAD  . CYSTOSCOPY WITH URETHRAL DILATATION  03/12/2017   Procedure: CYSTOSCOPY WITH URETHRAL DILATATION;  Surgeon: Cole Arabian, MD;  Location: [facility];  Service: Orthopedics;;  Cole Martinez, Resident Assisting  . CYSTOSCOPY WITH URETHRAL DILATATION N/A 10/03/2018   Procedure: CYSTOSCOPY WITH URETHRAL BALLOON DILATATION WITH BILATERAL RETROGRADE PYELOGRAPHY;  Surgeon: Cole Bring, MD;  Location: [facility];  Service: Urology;  Laterality: N/A;  . LAPAROSCOPIC INGUINAL HERNIA REPAIR Bilateral 01-10-2004   W/ MESH  . neck benign removed from neck  yrs ago  . PROSTATE BIOPSY N/A 07/12/2012   Procedure: PROSTATE BIOPSY AND ULTRASOUND;  Surgeon: Cole Rud, MD;  Location: [facility];  Service: Urology;  Laterality: N/A;  . PROSTATE SURGERY  2002   tuna  . REMOVAL LEFT NECK LYMPH NODE  1996  . TONSILLECTOMY  CHILD  . TOTAL KNEE ARTHROPLASTY Right 03/12/2017   Procedure: RIGHT TOTAL KNEE ARTHROPLASTY;  Surgeon: Cole Arabian, MD;  Location: [facility];  Service: Orthopedics;  Laterality: Right;  Adductor Block  . TRANSURETHRAL RESECTION OF PROSTATE N/A 08/08/2013   Procedure: TRANSURETHRAL RESECTION OF THE PROSTATE WITH GYRUS INSTRUMENTS;  Surgeon: Cole Rud, MD;  Location: [facility];  Service: Urology;  Laterality: N/A;  . TRANSURETHRAL RESECTION OF PROSTATE      Current Medications: Current Meds  Medication Sig  . amLODipine (NORVASC) 2.5 MG tablet TAKE 1 TABLET BY MOUTH  DAILY  . amphetamine-dextroamphetamine (ADDERALL) 5 MG tablet Take 5 mg by mouth 2 (two) times daily.  Marland Kitchen aspirin EC 81 MG tablet Take 1 tablet by mouth daily.  Marland Kitchen atorvastatin (LIPITOR) 20 MG tablet TAKE 1 TABLET BY MOUTH  EVERY MORNING.  . calcium carbonate (OSCAL) 1500 (600 Ca) MG TABS tablet Take by mouth  daily with breakfast.  . Cholecalciferol (VITAMIN D3) 50 MCG (2000 UT) capsule Take 2,000 Units by mouth daily.   . Coenzyme Q10 (CO Q-10) 200 MG CAPS Take 200 mg by mouth daily.   . Cyanocobalamin (VITAMIN B-12) 2500 MCG SUBL Place 2,500 mcg under the tongue daily.  . metoprolol succinate (TOPROL-XL) 25 MG 24 hr tablet Take 1 tablet (25 mg total) by mouth daily.  Marland Kitchen omeprazole (PRILOSEC OTC) 20 MG tablet Take 20 mg by mouth daily.  . tadalafil (CIALIS) 5 MG tablet Take 5 mg by mouth daily.     Allergies:   Patient has no known allergies.   Social History   Socioeconomic History  . Marital status: Married    Spouse name: Not on file  . Number of children: Not on file  . Years of education: Not on file  . Highest education level: Not on file  Occupational History  . Not on file  Social Needs  . Financial resource strain: Not on file  . Food insecurity    Worry: Not on file    Inability: Not on file  . Transportation needs    Medical: Not on file    Non-medical: Not on file  Tobacco Use  . Smoking status: Former Smoker    Packs/day: 1.00    Years: 10.00    Pack years: 10.00    Types: Cigarettes    Quit date: 07/09/1984    Years since quitting: 34.6  . Smokeless tobacco: Never Used  Substance and Sexual Activity  . Alcohol use: Yes    Alcohol/week: 2.0 standard drinks    Types: 2 Standard drinks or equivalent per week    Comment: daily 1 per day ,   . Drug use: No  . Sexual activity: Not on file  Lifestyle  . Physical activity    Days per week: Not on file    Minutes per session: Not on file  . Stress: Not on file  Relationships  . Social Herbalist on phone: Not on file    Gets together: Not on file    Attends religious service: Not on file    Active member of club or organization: Not on file    Attends meetings of clubs or organizations: Not on file    Relationship status: Not on file  Other Topics Concern  . Not on file  Social History Narrative    Lives at home with is wife, Cole [facility].  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  Has BBA.  Has 3 children.    Caffeine 2 cups coffee.       Family History: The patient's family history includes Diabetes in his brother; Obesity in his brother; Parkinsonism in his brother.  ROS:   Please see the history of present illness.    He is concerned.  When he lies down he has some left flank pain.  All other systems  reviewed and are negative.  EKGs/Labs/Other Studies Reviewed:    The following studies were reviewed today: Myocardial perfusion imaging 01/24/2019 Study Highlights   Nuclear stress EF: 62%.  There was no ST segment deviation noted during stress.  The study is normal.  This is a low risk study.  The left ventricular ejection fraction is normal (55-65%).     EKG:  EKG no new tracing is performed.  Recent Labs: 10/02/2018: BUN 21; Creatinine, Ser 0.94; Potassium 4.7; Sodium 139 12/09/2018: Hemoglobin 15.5; Platelet Count 234  Recent Lipid Panel    Component Value Date/Time   CHOL 154 12/14/2015 0806   TRIG 118 12/14/2015 0806   HDL 47 12/14/2015 0806   CHOLHDL 3.3 12/14/2015 0806   VLDL 24 12/14/2015 0806   LDLCALC 83 12/14/2015 0806    Physical Exam:    VS:  BP 122/68   Pulse 67   Ht 5' 7"  (1.702 Cole)   Wt 180 lb (81.6 kg)   SpO2 97%   BMI 28.19 kg/Cole     Wt Readings from Last 3 Encounters:  02/07/19 180 lb (81.6 kg)  01/24/19 181 lb (82.1 kg)  01/22/19 181 lb (82.1 kg)     GEN: Abdominal obesity.. No acute distress HEENT: Normal NECK: No JVD. LYMPHATICS: No lymphadenopathy CARDIAC:  RRR without murmur, gallop, or edema. VASCULAR:  Normal Pulses. No bruits. RESPIRATORY:  Clear to auscultation without rales, wheezing or rhonchi  ABDOMEN: Soft, non-tender, non-distended, No pulsatile mass, MUSCULOSKELETAL: No deformity  SKIN: Warm and dry NEUROLOGIC:  Alert and oriented x 3 PSYCHIATRIC:  Normal affect   ASSESSMENT:    1. Coronary artery disease involving native  coronary artery of native heart with angina pectoris ([place])   2. Essential hypertension   3. Mixed hyperlipidemia   4. Educated about COVID-19 virus infection    PLAN:    In order of problems listed above:  1. Still having angina despite medications and in spite of abnormal myocardial perfusion study.  We will plan diagnostic coronary angiography to define anatomy and help guide therapy and exclude the possibility of the we are missing a totally occluded vessel with inadequate collaterals or matched three-vessel ischemia. 2. Blood pressure better controlled 3. Target LDL less than 70 is being achieved 4. The 3W's is being practiced.  The patient was counseled to undergo left heart catheterization, coronary angiography, and possible percutaneous coronary intervention with stent implantation. The procedural risks and benefits were discussed in detail. The risks discussed included death, stroke, myocardial infarction, life-threatening bleeding, limb ischemia, kidney injury, allergy, and possible emergency cardiac surgery. The risk of these significant complications were estimated to occur less than 1% of the time. After discussion, the patient has agreed to proceed.    Medication Adjustments/Labs and Tests Ordered: Current medicines are reviewed at length with the patient today.  Concerns regarding medicines are outlined above.  No orders of the defined types were placed in this encounter.  No orders of the defined types were placed in this encounter.   There are no Patient Instructions on file for this visit.   Signed, Cole Grooms, MD  02/07/2019 4:13 PM    [facility]

## 2019-02-06 NOTE — H&P (View-Only) (Signed)
Cardiology Office Note:    Date:  02/07/2019   ID:  Cole [facility], DOB Dec 22, 1945, MRN [id number]  PCP:  Josetta Huddle, MD  Cardiologist:  Sinclair Grooms, MD   Referring MD: Josetta Huddle, MD   Chief Complaint  Patient presents with  . Coronary Artery Disease    History of Present Illness:    Cole [facility] is a 73 y.o. male with a hx of remote CAD and PCI, hyperlipidemia, hypertension, and obesity. Recent recurrent angina returning today to reassess after medication titration. May need cath.  Still having chest discomfort.  It occurs with activity.  It goes away with rest.  Perhaps slightly better since starting metoprolol.  Myocardial perfusion imaging was normal recently.  He still feels short of breath when he walks.  His blood pressure is much better.  Past Medical History:  Diagnosis Date  . ADHD (attention deficit hyperactivity disorder)   . B12 deficiency   . BPH (benign prostatic hypertrophy)   . Chronic lymphocytic leukemia (CLL), T-cell ([place]) DX 1996--  ONCOLOGIST-  DR [facility]   PT IS ASYMPTOMATIC--- LAST CBC W/ DIFF 06-24-2012 STABLE  . Coronary artery disease CARDIOLOGIST- DR Daneen Schick   S/P STENTING LAD 1999 // Myoview 01/2019: EF 62, normal perfusion; Low Risk  . Crohn's disease of ileum ([place]) SINCE 1988  . ED (erectile dysfunction)   . Elevated PSA   . Essential and other specified forms of tremor 11/25/2012  . H/O adenomatous polyp of colon   . Hyperlipidemia   . Nocturia   . OA (osteoarthritis)   . Peripheral neuropathy    hx of, none current as of 08-04-13  . Peyronie disease   . S/P coronary artery stent placement OCT 1999 OF LAD  . Tremor, hereditary, benign MILD RIGHT HAND    Past Surgical History:  Procedure Laterality Date  . CARDIOVASCULAR STRESS TEST  11-01-2010 DR Daneen Schick   NORMAL PERFUSION STUDY/ EF 64%/ NO ISCHEMIA  . cataract surgery  Bilateral feburary 2020   with lens placement   . COLONOSCOPY WITH PROPOFOL N/A 09/24/2012   Procedure: COLONOSCOPY WITH PROPOFOL;  Surgeon: [name], MD;  [facility];  Service: Endoscopy;  Laterality: N/A;  . CORONARY ANGIOPLASTY WITH STENT PLACEMENT  OCT 1999   STENT OF LAD  . CYSTOSCOPY WITH URETHRAL DILATATION  03/12/2017   Procedure: CYSTOSCOPY WITH URETHRAL DILATATION;  Surgeon: Gaynelle Arabian, MD;  [facility];  Service: Orthopedics;;  Ammie Dalton, Resident Assisting  . CYSTOSCOPY WITH URETHRAL DILATATION N/A 10/03/2018   Procedure: CYSTOSCOPY WITH URETHRAL BALLOON DILATATION WITH BILATERAL RETROGRADE PYELOGRAPHY;  Surgeon: Raynelle Bring, MD;  Location[facility];  Service: Urology;  Laterality: N/A;  . LAPAROSCOPIC INGUINAL HERNIA REPAIR Bilateral 01-10-2004   W/ MESH  . neck benign removed from neck  yrs ago  . PROSTATE BIOPSY N/A 07/12/2012   Procedure: PROSTATE BIOPSY AND ULTRASOUND;  Surgeon: Ailene Rud, MD;  Location: [facility];  Service: Urology;  Laterality: N/A;  . PROSTATE SURGERY  2002   tuna  . REMOVAL LEFT NECK LYMPH NODE  1996  . TONSILLECTOMY  CHILD  . TOTAL KNEE ARTHROPLASTY Right 03/12/2017   Procedure: RIGHT TOTAL KNEE ARTHROPLASTY;  Surgeon: Gaynelle Arabian, MD;  Location: [facility];  Service: Orthopedics;  Laterality: Right;  Adductor Block  . TRANSURETHRAL RESECTION OF PROSTATE N/A 08/08/2013   Procedure: TRANSURETHRAL RESECTION OF THE PROSTATE WITH GYRUS INSTRUMENTS;  Surgeon: Ailene Rud, MD;  Location: [facility];  Service: Urology;  Laterality: N/A;  . TRANSURETHRAL RESECTION OF PROSTATE      Current Medications: Current Meds  Medication Sig  . amLODipine (NORVASC) 2.5 MG tablet TAKE 1 TABLET BY MOUTH  DAILY  . amphetamine-dextroamphetamine (ADDERALL) 5 MG tablet Take 5 mg by mouth 2 (two) times daily.  Marland Kitchen aspirin EC 81 MG tablet Take 1 tablet by mouth daily.  Marland Kitchen atorvastatin (LIPITOR) 20 MG tablet TAKE 1 TABLET BY MOUTH  EVERY MORNING.  . calcium carbonate (OSCAL) 1500 (600 Ca) MG TABS tablet Take by mouth  daily with breakfast.  . Cholecalciferol (VITAMIN D3) 50 MCG (2000 UT) capsule Take 2,000 Units by mouth daily.   . Coenzyme Q10 (CO Q-10) 200 MG CAPS Take 200 mg by mouth daily.   . Cyanocobalamin (VITAMIN B-12) 2500 MCG SUBL Place 2,500 mcg under the tongue daily.  . metoprolol succinate (TOPROL-XL) 25 MG 24 hr tablet Take 1 tablet (25 mg total) by mouth daily.  Marland Kitchen omeprazole (PRILOSEC OTC) 20 MG tablet Take 20 mg by mouth daily.  . tadalafil (CIALIS) 5 MG tablet Take 5 mg by mouth daily.     Allergies:   Patient has no known allergies.   Social History   Socioeconomic History  . Marital status: Married    Spouse name: Not on file  . Number of children: Not on file  . Years of education: Not on file  . Highest education level: Not on file  Occupational History  . Not on file  Social Needs  . Financial resource strain: Not on file  . Food insecurity    Worry: Not on file    Inability: Not on file  . Transportation needs    Medical: Not on file    Non-medical: Not on file  Tobacco Use  . Smoking status: Former Smoker    Packs/day: 1.00    Years: 10.00    Pack years: 10.00    Types: Cigarettes    Quit date: 07/09/1984    Years since quitting: 34.6  . Smokeless tobacco: Never Used  Substance and Sexual Activity  . Alcohol use: Yes    Alcohol/week: 2.0 standard drinks    Types: 2 Standard drinks or equivalent per week    Comment: daily 1 per day ,   . Drug use: No  . Sexual activity: Not on file  Lifestyle  . Physical activity    Days per week: Not on file    Minutes per session: Not on file  . Stress: Not on file  Relationships  . Social Herbalist on phone: Not on file    Gets together: Not on file    Attends religious service: Not on file    Active member of club or organization: Not on file    Attends meetings of clubs or organizations: Not on file    Relationship status: Not on file  Other Topics Concern  . Not on file  Social History Narrative    Lives at home with is wife, [name].  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  Has BBA.  Has 3 children.    Caffeine 2 cups coffee.       Family History: The patient's family history includes Diabetes in his brother; Obesity in his brother; Parkinsonism in his brother.  ROS:   Please see the history of present illness.    He is concerned.  When he lies down he has some left flank pain.  All other systems  reviewed and are negative.  EKGs/Labs/Other Studies Reviewed:    The following studies were reviewed today: Myocardial perfusion imaging 01/24/2019 Study Highlights   Nuclear stress EF: 62%.  There was no ST segment deviation noted during stress.  The study is normal.  This is a low risk study.  The left ventricular ejection fraction is normal (55-65%).     EKG:  EKG no new tracing is performed.  Recent Labs: 10/02/2018: BUN 21; Creatinine, Ser 0.94; Potassium 4.7; Sodium 139 12/09/2018: Hemoglobin 15.5; Platelet Count 234  Recent Lipid Panel    Component Value Date/Time   CHOL 154 12/14/2015 0806   TRIG 118 12/14/2015 0806   HDL 47 12/14/2015 0806   CHOLHDL 3.3 12/14/2015 0806   VLDL 24 12/14/2015 0806   LDLCALC 83 12/14/2015 0806    Physical Exam:    VS:  BP 122/68   Pulse 67   Ht 5' 7"  (1.702 m)   Wt 180 lb (81.6 kg)   SpO2 97%   BMI 28.19 kg/m     Wt Readings from Last 3 Encounters:  02/07/19 180 lb (81.6 kg)  01/24/19 181 lb (82.1 kg)  01/22/19 181 lb (82.1 kg)     GEN: Abdominal obesity.. No acute distress HEENT: Normal NECK: No JVD. LYMPHATICS: No lymphadenopathy CARDIAC:  RRR without murmur, gallop, or edema. VASCULAR:  Normal Pulses. No bruits. RESPIRATORY:  Clear to auscultation without rales, wheezing or rhonchi  ABDOMEN: Soft, non-tender, non-distended, No pulsatile mass, MUSCULOSKELETAL: No deformity  SKIN: Warm and dry NEUROLOGIC:  Alert and oriented x 3 PSYCHIATRIC:  Normal affect   ASSESSMENT:    1. Coronary artery disease involving native  coronary artery of native heart with angina pectoris ([place])   2. Essential hypertension   3. Mixed hyperlipidemia   4. Educated about COVID-19 virus infection    PLAN:    In order of problems listed above:  1. Still having angina despite medications and in spite of abnormal myocardial perfusion study.  We will plan diagnostic coronary angiography to define anatomy and help guide therapy and exclude the possibility of the we are missing a totally occluded vessel with inadequate collaterals or matched three-vessel ischemia. 2. Blood pressure better controlled 3. Target LDL less than 70 is being achieved 4. The 3W's is being practiced.  The patient was counseled to undergo left heart catheterization, coronary angiography, and possible percutaneous coronary intervention with stent implantation. The procedural risks and benefits were discussed in detail. The risks discussed included death, stroke, myocardial infarction, life-threatening bleeding, limb ischemia, kidney injury, allergy, and possible emergency cardiac surgery. The risk of these significant complications were estimated to occur less than 1% of the time. After discussion, the patient has agreed to proceed.    Medication Adjustments/Labs and Tests Ordered: Current medicines are reviewed at length with the patient today.  Concerns regarding medicines are outlined above.  No orders of the defined types were placed in this encounter.  No orders of the defined types were placed in this encounter.   There are no Patient Instructions on file for this visit.   Signed, Sinclair Grooms, MD  02/07/2019 4:13 PM    [facility]

## 2019-02-06 NOTE — H&P (View-Only) (Signed)
Cardiology Office Note:    Date:  02/07/2019   ID:  Cole Martinez, DOB [birth year], MRN [id number]  PCP:  Josetta Huddle, MD  Cardiologist:  Sinclair Grooms, MD   Referring MD: Josetta Huddle, MD   Chief Complaint  Patient presents with  . Coronary Artery Disease    History of Present Illness:    Cole Martinez is a 73 y.o. male with a hx of remote CAD and PCI, hyperlipidemia, hypertension, and obesity. Recent recurrent angina returning today to reassess after medication titration. May need cath.  Still having chest discomfort.  It occurs with activity.  It goes away with rest.  Perhaps slightly better since starting metoprolol.  Myocardial perfusion imaging was normal recently.  He still feels short of breath when he walks.  His blood pressure is much better.  Past Medical History:  Diagnosis Date  . ADHD (attention deficit hyperactivity disorder)   . B12 deficiency   . BPH (benign prostatic hypertrophy)   . 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 Coronary artery disease CARDIOLOGIST- DR Daneen Schick   S/P STENTING LAD 1999 // Myoview 01/2019: EF 62, normal perfusion; Low Risk  . Crohn's disease of ileum ([place]) SINCE 1988  . ED (erectile dysfunction)   . Elevated PSA   . Essential and other specified forms of tremor 11/25/2012  . H/O adenomatous polyp of colon   . Hyperlipidemia   . Nocturia   . OA (osteoarthritis)   . Peripheral neuropathy    hx of, none current as of 08-04-13  . Peyronie disease   . S/P coronary artery stent placement OCT 1999 OF LAD  . Tremor, hereditary, benign MILD RIGHT HAND    Past Surgical History:  Procedure Laterality Date  . CARDIOVASCULAR STRESS TEST  11-01-2010 DR Daneen Schick   NORMAL PERFUSION STUDY/ EF 64%/ NO ISCHEMIA  . cataract surgery  Bilateral feburary 2020   with lens placement   . COLONOSCOPY WITH PROPOFOL N/A 09/24/2012   Procedure: COLONOSCOPY WITH PROPOFOL;  Surgeon: [name], MD;  Location: [facility];  Service: Endoscopy;  Laterality: N/A;  . CORONARY ANGIOPLASTY WITH STENT PLACEMENT  OCT 1999   STENT OF LAD  . CYSTOSCOPY WITH URETHRAL DILATATION  03/12/2017   Procedure: CYSTOSCOPY WITH URETHRAL DILATATION;  Surgeon: Gaynelle Arabian, MD;  Location: [facility];  Service: Orthopedics;;  Ammie Dalton, Resident Assisting  . CYSTOSCOPY WITH URETHRAL DILATATION N/A 10/03/2018   Procedure: CYSTOSCOPY WITH URETHRAL BALLOON DILATATION WITH BILATERAL RETROGRADE PYELOGRAPHY;  Surgeon: Raynelle Bring, MD;  Location: [facility];  Service: Urology;  Laterality: N/A;  . LAPAROSCOPIC INGUINAL HERNIA REPAIR Bilateral 01-10-2004   W/ MESH  . neck benign removed from neck  yrs ago  . PROSTATE BIOPSY N/A 07/12/2012   Procedure: PROSTATE BIOPSY AND ULTRASOUND;  Surgeon: Ailene Rud, MD;  [facility];  Service: Urology;  Laterality: N/A;  . PROSTATE SURGERY  2002   tuna  . REMOVAL LEFT NECK LYMPH NODE  1996  . TONSILLECTOMY  CHILD  . TOTAL KNEE ARTHROPLASTY Right 03/12/2017   Procedure: RIGHT TOTAL KNEE ARTHROPLASTY;  Surgeon: Gaynelle Arabian, MD;  Location: [facility];  Service: Orthopedics;  Laterality: Right;  Adductor Block  . TRANSURETHRAL RESECTION OF PROSTATE N/A 08/08/2013   Procedure: TRANSURETHRAL RESECTION OF THE PROSTATE WITH GYRUS INSTRUMENTS;  Surgeon: Ailene Rud, MD;  Location: [facility];  Service: Urology;  Laterality: N/A;  . TRANSURETHRAL RESECTION OF PROSTATE      Current Medications: Current Meds  Medication Sig  . amLODipine (NORVASC) 2.5 MG tablet TAKE 1 TABLET BY MOUTH  DAILY  . amphetamine-dextroamphetamine (ADDERALL) 5 MG tablet Take 5 mg by mouth 2 (two) times daily.  Marland Kitchen aspirin EC 81 MG tablet Take 1 tablet by mouth daily.  Marland Kitchen atorvastatin (LIPITOR) 20 MG tablet TAKE 1 TABLET BY MOUTH  EVERY MORNING.  . calcium carbonate (OSCAL) 1500 (600 Ca) MG TABS tablet Take by mouth  daily with breakfast.  . Cholecalciferol (VITAMIN D3) 50 MCG (2000 UT) capsule Take 2,000 Units by mouth daily.   . Coenzyme Q10 (CO Q-10) 200 MG CAPS Take 200 mg by mouth daily.   . Cyanocobalamin (VITAMIN B-12) 2500 MCG SUBL Place 2,500 mcg under the tongue daily.  . metoprolol succinate (TOPROL-XL) 25 MG 24 hr tablet Take 1 tablet (25 mg total) by mouth daily.  Marland Kitchen omeprazole (PRILOSEC OTC) 20 MG tablet Take 20 mg by mouth daily.  . tadalafil (CIALIS) 5 MG tablet Take 5 mg by mouth daily.     Allergies:   Patient has no known allergies.   Social History   Socioeconomic History  . Marital status: Married    Spouse name: Not on file  . Number of children: Not on file  . Years of education: Not on file  . Highest education level: Not on file  Occupational History  . Not on file  Social Needs  . Financial resource strain: Not on file  . Food insecurity    Worry: Not on file    Inability: Not on file  . Transportation needs    Medical: Not on file    Non-medical: Not on file  Tobacco Use  . Smoking status: Former Smoker    Packs/day: 1.00    Years: 10.00    Pack years: 10.00    Types: Cigarettes    Quit date: 07/09/1984    Years since quitting: 34.6  . Smokeless tobacco: Never Used  Substance and Sexual Activity  . Alcohol use: Yes    Alcohol/week: 2.0 standard drinks    Types: 2 Standard drinks or equivalent per week    Comment: daily 1 per day ,   . Drug use: No  . Sexual activity: Not on file  Lifestyle  . Physical activity    Days per week: Not on file    Minutes per session: Not on file  . Stress: Not on file  Relationships  . Social Herbalist on phone: Not on file    Gets together: Not on file    Attends religious service: Not on file    Active member of club or organization: Not on file    Attends meetings of clubs or organizations: Not on file    Relationship status: Not on file  Other Topics Concern  . Not on file  Social History Narrative    Lives at home with is wife, [name].  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  Has BBA.  Has 3 children.    Caffeine 2 cups coffee.       Family History: The patient's family history includes Diabetes in his brother; Obesity in his brother; Parkinsonism in his brother.  ROS:   Please see the history of present illness.    He is concerned.  When he lies down he has some left flank pain.  All other systems  reviewed and are negative.  EKGs/Labs/Other Studies Reviewed:    The following studies were reviewed today: Myocardial perfusion imaging 01/24/2019 Study Highlights   Nuclear stress EF: 62%.  There was no ST segment deviation noted during stress.  The study is normal.  This is a low risk study.  The left ventricular ejection fraction is normal (55-65%).     EKG:  EKG no new tracing is performed.  Recent Labs: 10/02/2018: BUN 21; Creatinine, Ser 0.94; Potassium 4.7; Sodium 139 12/09/2018: Hemoglobin 15.5; Platelet Count 234  Recent Lipid Panel    Component Value Date/Time   CHOL 154 12/14/2015 0806   TRIG 118 12/14/2015 0806   HDL 47 12/14/2015 0806   CHOLHDL 3.3 12/14/2015 0806   VLDL 24 12/14/2015 0806   LDLCALC 83 12/14/2015 0806    Physical Exam:    VS:  BP 122/68   Pulse 67   Ht 5' 7"  (1.702 m)   Wt 180 lb (81.6 kg)   SpO2 97%   BMI 28.19 kg/m     Wt Readings from Last 3 Encounters:  02/07/19 180 lb (81.6 kg)  01/24/19 181 lb (82.1 kg)  01/22/19 181 lb (82.1 kg)     GEN: Abdominal obesity.. No acute distress HEENT: Normal NECK: No JVD. LYMPHATICS: No lymphadenopathy CARDIAC:  RRR without murmur, gallop, or edema. VASCULAR:  Normal Pulses. No bruits. RESPIRATORY:  Clear to auscultation without rales, wheezing or rhonchi  ABDOMEN: Soft, non-tender, non-distended, No pulsatile mass, MUSCULOSKELETAL: No deformity  SKIN: Warm and dry NEUROLOGIC:  Alert and oriented x 3 PSYCHIATRIC:  Normal affect   ASSESSMENT:    1. Coronary artery disease involving native  coronary artery of native heart with angina pectoris ([place])   2. Essential hypertension   3. Mixed hyperlipidemia   4. Educated about COVID-19 virus infection    PLAN:    In order of problems listed above:  1. Still having angina despite medications and in spite of abnormal myocardial perfusion study.  We will plan diagnostic coronary angiography to define anatomy and help guide therapy and exclude the possibility of the we are missing a totally occluded vessel with inadequate collaterals or matched three-vessel ischemia. 2. Blood pressure better controlled 3. Target LDL less than 70 is being achieved 4. The 3W's is being practiced.  The patient was counseled to undergo left heart catheterization, coronary angiography, and possible percutaneous coronary intervention with stent implantation. The procedural risks and benefits were discussed in detail. The risks discussed included death, stroke, myocardial infarction, life-threatening bleeding, limb ischemia, kidney injury, allergy, and possible emergency cardiac surgery. The risk of these significant complications were estimated to occur less than 1% of the time. After discussion, the patient has agreed to proceed.    Medication Adjustments/Labs and Tests Ordered: Current medicines are reviewed at length with the patient today.  Concerns regarding medicines are outlined above.  No orders of the defined types were placed in this encounter.  No orders of the defined types were placed in this encounter.   There are no Patient Instructions on file for this visit.   Signed, Sinclair Grooms, MD  02/07/2019 4:13 PM    [facility]

## 2019-02-07 ENCOUNTER — Ambulatory Visit (INDEPENDENT_AMBULATORY_CARE_PROVIDER_SITE_OTHER): Payer: Medicare Other | Admitting: Interventional Cardiology

## 2019-02-07 ENCOUNTER — Other Ambulatory Visit: Payer: Self-pay

## 2019-02-07 ENCOUNTER — Encounter: Payer: Self-pay | Admitting: Interventional Cardiology

## 2019-02-07 VITALS — BP 122/68 | HR 67 | Ht 67.0 in | Wt 180.0 lb

## 2019-02-07 DIAGNOSIS — I25119 Atherosclerotic heart disease of native coronary artery with unspecified angina pectoris: Secondary | ICD-10-CM

## 2019-02-07 DIAGNOSIS — E782 Mixed hyperlipidemia: Secondary | ICD-10-CM | POA: Diagnosis not present

## 2019-02-07 DIAGNOSIS — I10 Essential (primary) hypertension: Secondary | ICD-10-CM

## 2019-02-07 DIAGNOSIS — Z7189 Other specified counseling: Secondary | ICD-10-CM

## 2019-02-07 DIAGNOSIS — I209 Angina pectoris, unspecified: Secondary | ICD-10-CM

## 2019-02-07 NOTE — Patient Instructions (Addendum)
Medication Instructions:  Your physician recommends that you continue on your current medications as directed. Please refer to the Current Medication list given to you today.  *If you need a refill on your cardiac medications before your next appointment, please call your pharmacy*  Lab Work: BMET and CBC today  If you have labs (blood work) drawn today and your tests are completely normal, you will receive your results only by: Marland Kitchen MyChart Message (if you have MyChart) OR . A paper copy in the mail If you have any lab test that is abnormal or we need to change your treatment, we will call you to review the results.  Testing/Procedures: Your physician has requested that you have a cardiac catheterization. Cardiac catheterization is used to diagnose and/or treat various heart conditions. Doctors may recommend this procedure for a number of different reasons. The most common reason is to evaluate chest pain. Chest pain can be a symptom of coronary artery disease (CAD), and cardiac catheterization can show whether plaque is narrowing or blocking your heart's arteries. This procedure is also used to evaluate the valves, as well as measure the blood flow and oxygen levels in different parts of your heart. For further information please visit [employer]uge[employer]iesta[employer]tn. Please follow instruction sheet, as given.   Follow-Up: At [facility], you and your health needs are our priority.  As part of our continuing mission to provide you with exceptional heart care, we have created designated Provider Care Teams.  These Care Teams include your primary Cardiologist (physician) and Advanced Practice Providers (APPs -  Physician Assistants and Nurse Practitioners) who all work together to provide you with the care you need, when you need it.  Your next appointment:   2-3 week(s)  The format for your next appointment:   In Person  Provider:   You may see Sinclair Grooms, MD or one of the following Advanced  Practice Providers on your designated Care Team:    Truitt Merle, NP  Cecilie Kicks, NP  Kathyrn Drown, NP   Other Instructions  COVID SCREENING INFORMATION: You are scheduled for your COVID screening on Monday, February 10, 2019 at 12:10PM. [facility]) [address] Stay in the RIGHT lane and proceed under the brick awning (NOT the tent) and tell them you are there for pre-procedure testing Do NOT bring any pets with you to the testing site     [place], [place] [address] Dept: [phone] Loc: [place]  02/07/2019  You are scheduled for a Cardiac Catheterization on Thursday, December 10 with Dr. Daneen Schick.  1. Please arrive at the [facility] (Main Entrance A) at [facility]: [address] [place], [place] [address] at 5:30 AM (This time is two hours before your procedure to ensure your preparation). Free valet parking service is available.   Special note: Every effort is made to have your procedure done on time. Please understand that emergencies sometimes delay scheduled procedures.  2. Diet: Do not eat solid foods after midnight.  The patient may have clear liquids until 5am upon the day of the procedure.  3. Labs: You will have labs drawn today.  4. Medication instructions in preparation for your procedure:   Contrast Allergy: No   On the morning of your procedure, take your Aspirin and any morning medicines NOT listed above.  You  may use sips of water.  5. Plan for one night stay--bring personal belongings. 6. Bring a current list of your medications and current insurance cards. 7. You MUST have a responsible person to drive you home. 8. Someone MUST be with you the first 24 hours after you arrive home or your discharge will be delayed. 9. Please wear clothes that are  easy to get on and off and wear slip-on shoes.  Thank you for allowing [place] to care for you!   -- [facility]

## 2019-02-07 NOTE — Addendum Note (Signed)
Addended by: Loren Racer on: 02/07/2019 04:40 PM   Modules accepted: Orders

## 2019-02-08 LAB — CBC
Hematocrit: 45 % (ref 37.5–51.0)
Hemoglobin: 15 g/dL (ref 13.0–17.7)
MCH: 29.6 pg (ref 26.6–33.0)
MCHC: 33.3 g/dL (ref 31.5–35.7)
MCV: 89 fL (ref 79–97)
Platelets: 243 10*3/uL (ref 150–450)
RBC: 5.06 x10E6/uL (ref 4.14–5.80)
RDW: 13.1 % (ref 11.6–15.4)
WBC: 9.1 10*3/uL (ref 3.4–10.8)

## 2019-02-08 LAB — BASIC METABOLIC PANEL
BUN/Creatinine Ratio: 21 (ref 10–24)
BUN: 23 mg/dL (ref 8–27)
CO2: 22 mmol/L (ref 20–29)
Calcium: 9.2 mg/dL (ref 8.6–10.2)
Chloride: 102 mmol/L (ref 96–106)
Creatinine, Ser: 1.1 mg/dL (ref 0.76–1.27)
GFR calc Af Amer: 76 mL/min/{1.73_m2} (ref >59)
GFR calc non Af Amer: 66 mL/min/{1.73_m2} (ref >59)
Glucose: 72 mg/dL (ref 65–99)
Potassium: 5.2 mmol/L (ref 3.5–5.2)
Sodium: 142 mmol/L (ref 134–144)

## 2019-02-10 ENCOUNTER — Other Ambulatory Visit (HOSPITAL_COMMUNITY)
Admission: RE | Admit: 2019-02-10 | Discharge: 2019-02-10 | Disposition: A | Discharge status: Home / Self Care | Payer: Medicare Other | Source: Ambulatory Visit | Attending: Interventional Cardiology | Admitting: Interventional Cardiology

## 2019-02-10 DIAGNOSIS — Z01812 Encounter for preprocedural laboratory examination: Secondary | ICD-10-CM | POA: Insufficient documentation

## 2019-02-10 DIAGNOSIS — Z20828 Contact with and (suspected) exposure to other viral communicable diseases: Secondary | ICD-10-CM | POA: Insufficient documentation

## 2019-02-11 LAB — NOVEL CORONAVIRUS, NAA (HOSP ORDER, SEND-OUT TO REF LAB; TAT 18-24 HRS): SARS-CoV-2, NAA: NOT DETECTED

## 2019-02-12 ENCOUNTER — Ambulatory Visit: Payer: Medicare Other | Admitting: Podiatry

## 2019-02-12 ENCOUNTER — Telehealth: Payer: Self-pay | Admitting: *Deleted

## 2019-02-12 NOTE — Telephone Encounter (Signed)
Pt contacted pre-catheterization scheduled at [facility] for: Thursday February 13, 2019 7:30 AM Verified arrival time and place: [facility]) at: 5:30 AM   No solid food after midnight prior to cath, clear liquids until 5 AM day of procedure. Contrast allergy: no  Hold: Cialis-until post procedure  AM meds can be  taken pre-cath with sip of water including: ASA 81 mg   Confirmed patient has responsible adult to drive home post procedure and observe 24 hours after arriving home: yes  Currently, due to Covid-19 pandemic, only one support person will be allowed with patient. Must be the same support person for that patient's entire stay, will be screened and required to wear a mask. They will be asked to wait in the waiting room for the duration of the patient's stay.  Patients are required to wear a mask when they enter the hospital.     COVID-19 Pre-Screening Questions:  . In the past 7 to 10 days have you had a cough,  shortness of breath, headache, congestion, fever (100 or greater) body aches, chills, sore throat, or sudden loss of taste or sense of smell? Runny nose/cough/hx sinus -not new in past 7-10 days . Have you been around anyone with known Covid 19? no . Have you been around anyone who is awaiting Covid 19 test results in the past 7 to 10 days? no . Have you been around anyone who has been exposed to Covid 19, or has mentioned symptoms of Covid 19 within the past 7 to 10 days? no   I reviewed procedure/mask/visitor instructions, Covid-19 screening questions with patient, he verbalized understanding, thanked me for call.

## 2019-02-13 ENCOUNTER — Encounter (HOSPITAL_COMMUNITY)
Admission: RE | Disposition: A | Discharge status: Home / Self Care | Payer: Self-pay | Source: Home / Self Care | Attending: Interventional Cardiology

## 2019-02-13 ENCOUNTER — Other Ambulatory Visit: Payer: Self-pay

## 2019-02-13 ENCOUNTER — Ambulatory Visit (HOSPITAL_COMMUNITY)
Admission: RE | Admit: 2019-02-13 | Discharge: 2019-02-13 | Disposition: A | Discharge status: Home / Self Care | Payer: Medicare Other | Attending: Interventional Cardiology | Admitting: Interventional Cardiology

## 2019-02-13 DIAGNOSIS — Z87891 Personal history of nicotine dependence: Secondary | ICD-10-CM | POA: Insufficient documentation

## 2019-02-13 DIAGNOSIS — I209 Angina pectoris, unspecified: Secondary | ICD-10-CM

## 2019-02-13 DIAGNOSIS — E782 Mixed hyperlipidemia: Secondary | ICD-10-CM | POA: Insufficient documentation

## 2019-02-13 DIAGNOSIS — E7800 Pure hypercholesterolemia, unspecified: Secondary | ICD-10-CM | POA: Diagnosis present

## 2019-02-13 DIAGNOSIS — Z955 Presence of coronary angioplasty implant and graft: Secondary | ICD-10-CM | POA: Diagnosis not present

## 2019-02-13 DIAGNOSIS — F909 Attention-deficit hyperactivity disorder, unspecified type: Secondary | ICD-10-CM | POA: Diagnosis not present

## 2019-02-13 DIAGNOSIS — I2510 Atherosclerotic heart disease of native coronary artery without angina pectoris: Secondary | ICD-10-CM | POA: Diagnosis present

## 2019-02-13 DIAGNOSIS — I10 Essential (primary) hypertension: Secondary | ICD-10-CM | POA: Diagnosis not present

## 2019-02-13 DIAGNOSIS — Z7982 Long term (current) use of aspirin: Secondary | ICD-10-CM | POA: Insufficient documentation

## 2019-02-13 DIAGNOSIS — E669 Obesity, unspecified: Secondary | ICD-10-CM | POA: Insufficient documentation

## 2019-02-13 DIAGNOSIS — M1990 Unspecified osteoarthritis, unspecified site: Secondary | ICD-10-CM | POA: Diagnosis not present

## 2019-02-13 DIAGNOSIS — Z6828 Body mass index (BMI) 28.0-28.9, adult: Secondary | ICD-10-CM | POA: Diagnosis not present

## 2019-02-13 DIAGNOSIS — I25119 Atherosclerotic heart disease of native coronary artery with unspecified angina pectoris: Secondary | ICD-10-CM | POA: Diagnosis not present

## 2019-02-13 DIAGNOSIS — Z79899 Other long term (current) drug therapy: Secondary | ICD-10-CM | POA: Insufficient documentation

## 2019-02-13 DIAGNOSIS — G629 Polyneuropathy, unspecified: Secondary | ICD-10-CM | POA: Insufficient documentation

## 2019-02-13 DIAGNOSIS — T82855A Stenosis of coronary artery stent, initial encounter: Secondary | ICD-10-CM | POA: Diagnosis not present

## 2019-02-13 DIAGNOSIS — E785 Hyperlipidemia, unspecified: Secondary | ICD-10-CM | POA: Diagnosis present

## 2019-02-13 DIAGNOSIS — N400 Enlarged prostate without lower urinary tract symptoms: Secondary | ICD-10-CM | POA: Diagnosis not present

## 2019-02-13 DIAGNOSIS — I25728 Atherosclerosis of autologous artery coronary artery bypass graft(s) with other forms of angina pectoris: Secondary | ICD-10-CM | POA: Diagnosis present

## 2019-02-13 HISTORY — PX: LEFT HEART CATH AND CORONARY ANGIOGRAPHY: CATH118249

## 2019-02-13 SURGERY — LEFT HEART CATH AND CORONARY ANGIOGRAPHY
Anesthesia: LOCAL

## 2019-02-13 MED ORDER — SODIUM CHLORIDE 0.9 % IV SOLN: INTRAVENOUS | Status: DC

## 2019-02-13 MED ORDER — VERAPAMIL HCL 2.5 MG/ML IV SOLN
INTRAVENOUS | Status: AC
  Filled 2019-02-13: qty 2

## 2019-02-13 MED ORDER — HYDRALAZINE HCL 20 MG/ML IJ SOLN: 10.0000 mg | INTRAMUSCULAR | Status: DC | PRN

## 2019-02-13 MED ORDER — OMEPRAZOLE MAGNESIUM 20 MG PO TBEC: 20.0000 mg | DELAYED_RELEASE_TABLET | Freq: Every day | ORAL | Status: DC

## 2019-02-13 MED ORDER — LABETALOL HCL 5 MG/ML IV SOLN: 10.0000 mg | INTRAVENOUS | Status: DC | PRN

## 2019-02-13 MED ORDER — IOHEXOL 350 MG/ML SOLN
INTRAVENOUS | Status: DC | PRN
  Administered 2019-02-13: 110 mL

## 2019-02-13 MED ORDER — LIDOCAINE HCL (PF) 1 % IJ SOLN
INTRAMUSCULAR | Status: AC
  Filled 2019-02-13: qty 30

## 2019-02-13 MED ORDER — SODIUM CHLORIDE 0.9% FLUSH: 3.0000 mL | INTRAVENOUS | Status: DC | PRN

## 2019-02-13 MED ORDER — FENTANYL CITRATE (PF) 100 MCG/2ML IJ SOLN
INTRAMUSCULAR | Status: AC
  Filled 2019-02-13: qty 2

## 2019-02-13 MED ORDER — FENTANYL CITRATE (PF) 100 MCG/2ML IJ SOLN
INTRAMUSCULAR | Status: DC | PRN
  Administered 2019-02-13: 25 ug via INTRAVENOUS

## 2019-02-13 MED ORDER — CALCIUM CARBONATE 1500 (600 CA) MG PO TABS: 1500.0000 mg | ORAL_TABLET | Freq: Every day | ORAL | Status: DC

## 2019-02-13 MED ORDER — AMPHETAMINE-DEXTROAMPHETAMINE 5 MG PO TABS: 5.0000 mg | ORAL_TABLET | Freq: Two times a day (BID) | ORAL | Status: DC

## 2019-02-13 MED ORDER — CARBOXYMETHYLCELLUL-GLYCERIN 0.5-0.9 % OP SOLN: 1.0000 [drp] | Freq: Every day | OPHTHALMIC | Status: DC | PRN

## 2019-02-13 MED ORDER — TADALAFIL 5 MG PO TABS: 5.0000 mg | ORAL_TABLET | Freq: Every day | ORAL | Status: DC

## 2019-02-13 MED ORDER — VITAMIN D3 50 MCG (2000 UT) PO CAPS: 2000.0000 [IU] | ORAL_CAPSULE | Freq: Every day | ORAL | Status: DC

## 2019-02-13 MED ORDER — MIDAZOLAM HCL 2 MG/2ML IJ SOLN
INTRAMUSCULAR | Status: AC
  Filled 2019-02-13: qty 2

## 2019-02-13 MED ORDER — HEPARIN SODIUM (PORCINE) 1000 UNIT/ML IJ SOLN
INTRAMUSCULAR | Status: AC
  Filled 2019-02-13: qty 1

## 2019-02-13 MED ORDER — ACETAMINOPHEN 325 MG PO TABS: 650.0000 mg | ORAL_TABLET | ORAL | Status: DC | PRN

## 2019-02-13 MED ORDER — CLOPIDOGREL BISULFATE 300 MG PO TABS
600.0000 mg | ORAL_TABLET | Freq: Once | ORAL | Status: AC
  Administered 2019-02-13: 600 mg via ORAL

## 2019-02-13 MED ORDER — ATORVASTATIN CALCIUM 20 MG PO TABS: 20.0000 mg | ORAL_TABLET | Freq: Every morning | ORAL | Status: DC

## 2019-02-13 MED ORDER — HEPARIN (PORCINE) IN NACL 1000-0.9 UT/500ML-% IV SOLN
INTRAVENOUS | Status: DC | PRN
  Administered 2019-02-13 (×2): 500 mL

## 2019-02-13 MED ORDER — SODIUM CHLORIDE 0.9% FLUSH: 3.0000 mL | Freq: Two times a day (BID) | INTRAVENOUS | Status: DC

## 2019-02-13 MED ORDER — ASPIRIN EC 81 MG PO TBEC: 81.0000 mg | DELAYED_RELEASE_TABLET | Freq: Every day | ORAL | Status: DC

## 2019-02-13 MED ORDER — MIDAZOLAM HCL 2 MG/2ML IJ SOLN
INTRAMUSCULAR | Status: DC | PRN
  Administered 2019-02-13: 1 mg via INTRAVENOUS

## 2019-02-13 MED ORDER — LIDOCAINE HCL (PF) 1 % IJ SOLN
INTRAMUSCULAR | Status: DC | PRN
  Administered 2019-02-13: 2 mL via INTRADERMAL

## 2019-02-13 MED ORDER — VITAMIN B-12 2500 MCG SL SUBL: 2500.0000 ug | SUBLINGUAL_TABLET | Freq: Every day | SUBLINGUAL | Status: DC

## 2019-02-13 MED ORDER — ASPIRIN 81 MG PO CHEW: 81.0000 mg | CHEWABLE_TABLET | Freq: Every day | ORAL | Status: DC

## 2019-02-13 MED ORDER — AMLODIPINE BESYLATE 5 MG PO TABS: 2.5000 mg | ORAL_TABLET | Freq: Every day | ORAL | Status: DC

## 2019-02-13 MED ORDER — CO Q-10 200 MG PO CAPS: 200.0000 mg | ORAL_CAPSULE | Freq: Every day | ORAL | Status: DC

## 2019-02-13 MED ORDER — CLOPIDOGREL BISULFATE 75 MG PO TABS: 300.0000 mg | ORAL_TABLET | Freq: Once | ORAL | Status: DC

## 2019-02-13 MED ORDER — ONDANSETRON HCL 4 MG/2ML IJ SOLN: 4.0000 mg | Freq: Four times a day (QID) | INTRAMUSCULAR | Status: DC | PRN

## 2019-02-13 MED ORDER — HEPARIN (PORCINE) IN NACL 1000-0.9 UT/500ML-% IV SOLN
INTRAVENOUS | Status: AC
  Filled 2019-02-13: qty 1000

## 2019-02-13 MED ORDER — HEPARIN SODIUM (PORCINE) 1000 UNIT/ML IJ SOLN
INTRAMUSCULAR | Status: DC | PRN
  Administered 2019-02-13: 4000 [IU] via INTRAVENOUS

## 2019-02-13 MED ORDER — SODIUM CHLORIDE 0.9 % WEIGHT BASED INFUSION
3.0000 mL/kg/h | INTRAVENOUS | Status: AC
  Administered 2019-02-13: 3 mL/kg/h via INTRAVENOUS

## 2019-02-13 MED ORDER — CLOPIDOGREL BISULFATE 300 MG PO TABS
ORAL_TABLET | ORAL | Status: AC
  Filled 2019-02-13: qty 2

## 2019-02-13 MED ORDER — SODIUM CHLORIDE 0.9 % IV SOLN: 250.0000 mL | INTRAVENOUS | Status: DC | PRN

## 2019-02-13 MED ORDER — ASPIRIN 81 MG PO CHEW: 81.0000 mg | CHEWABLE_TABLET | ORAL | Status: DC

## 2019-02-13 MED ORDER — METOPROLOL SUCCINATE ER 25 MG PO TB24: 25.0000 mg | ORAL_TABLET | Freq: Every day | ORAL | Status: DC

## 2019-02-13 MED ORDER — VERAPAMIL HCL 2.5 MG/ML IV SOLN
INTRAVENOUS | Status: DC | PRN
  Administered 2019-02-13: 10 mL via INTRA_ARTERIAL

## 2019-02-13 MED ORDER — SODIUM CHLORIDE 0.9 % WEIGHT BASED INFUSION: 1.0000 mL/kg/h | INTRAVENOUS | Status: DC

## 2019-02-13 SURGICAL SUPPLY — 13 items
CATH 5FR JL3.5 JR4 ANG PIG MP (CATHETERS) ×2 IMPLANT
CATH INFINITI 5FR AL1 (CATHETERS) ×2 IMPLANT
DEVICE RAD COMP TR BAND LRG (VASCULAR PRODUCTS) ×2 IMPLANT
GLIDESHEATH SLEND A-KIT 6F 22G (SHEATH) ×2 IMPLANT
GUIDEWIRE ANGLED .035X150CM (WIRE) ×2 IMPLANT
GUIDEWIRE INQWIRE 1.5J.035X260 (WIRE) ×1 IMPLANT
INQWIRE 1.5J .035X260CM (WIRE) ×2
KIT HEART LEFT (KITS) ×2 IMPLANT
PACK CARDIAC CATHETERIZATION (CUSTOM PROCEDURE TRAY) ×2 IMPLANT
SHEATH PROBE COVER 6X72 (BAG) ×2 IMPLANT
TRANSDUCER W/STOPCOCK (MISCELLANEOUS) ×2 IMPLANT
TUBING CIL FLEX 10 FLL-RA (TUBING) ×2 IMPLANT
WIRE HI TORQ VERSACORE-J 145CM (WIRE) ×2 IMPLANT

## 2019-02-13 NOTE — Interval H&P Note (Signed)
Cath Lab Visit (complete for each Cath Lab visit)  Clinical Evaluation Leading to the Procedure:   ACS: No.  Non-ACS:    Anginal Classification: CCS Martinez  Anti-ischemic medical therapy: Minimal Therapy (1 class of medications)  Non-Invasive Test Results: Low-risk stress test findings: cardiac mortality <1%/year  Prior CABG: No previous CABG      History and Physical Interval Note:  02/13/2019 7:25 AM  Cole Martinez  has presented today for surgery, with the diagnosis of angina.  The various methods of treatment have been discussed with the patient and family. After consideration of risks, benefits and other options for treatment, the patient has consented to  Procedure(s): LEFT HEART CATH AND CORONARY ANGIOGRAPHY (N/A) as a surgical intervention.  The patient's history has been reviewed, patient examined, no change in status, stable for surgery.  I have reviewed the patient's chart and labs.  Questions were answered to the patient's satisfaction.     Cole Martinez

## 2019-02-13 NOTE — Discharge Instructions (Signed)
NOTHING TO EAT AFTER MIDNIGHT TAKE MORNING MEDS INCLUDING ASPIRIN 81MG  Radial Site Care  This sheet gives you information about how to care for yourself after your procedure. Your health care provider may also give you more specific instructions. If you have problems or questions, contact your health care provider. What can I expect after the procedure? After the procedure, it is common to have:  Bruising and tenderness at the catheter insertion area. Follow these instructions at home: Medicines  Take over-the-counter and prescription medicines only as told by your health care provider. Insertion site care  Follow instructions from your health care provider about how to take care of your insertion site. Make sure you: ? Wash your hands with soap and water before you change your bandage (dressing). If soap and water are not available, use hand sanitizer. ? Change your dressing as told by your health care provider. ? Leave stitches (sutures), skin glue, or adhesive strips in place. These skin closures may need to stay in place for 2 weeks or longer. If adhesive strip edges start to loosen and curl up, you may trim the loose edges. Do not remove adhesive strips completely unless your health care provider tells you to do that.  Check your insertion site every day for signs of infection. Check for: ? Redness, swelling, or pain. ? Fluid or blood. ? Pus or a bad smell. ? Warmth.  Do not take baths, swim, or use a hot tub until your health care provider approves.  You may shower 24-48 hours after the procedure, or as directed by your health care provider. ? Remove the dressing and gently wash the site with plain soap and water. ? Pat the area dry with a clean towel. ? Do not rub the site. That could cause bleeding.  Do not apply powder or lotion to the site. Activity   For 24 hours after the procedure, or as directed by your health care provider: ? Do not flex or bend the affected  arm. ? Do not push or pull heavy objects with the affected arm. ? Do not drive yourself home from the hospital or clinic. You may drive 24 hours after the procedure unless your health care provider tells you not to. ? Do not operate machinery or power tools.  Do not lift anything that is heavier than 10 lb (4.5 kg), or the limit that you are told, until your health care provider says that it is safe.  Ask your health care provider when it is okay to: ? Return to work or school. ? Resume usual physical activities or sports. ? Resume sexual activity. General instructions  If the catheter site starts to bleed, raise your arm and put firm pressure on the site. If the bleeding does not stop, get help right away. This is a medical emergency.  If you went home on the same day as your procedure, a responsible adult should be with you for the first 24 hours after you arrive home.  Keep all follow-up visits as told by your health care provider. This is important. Contact a health care provider if:  You have a fever.  You have redness, swelling, or yellow drainage around your insertion site. Get help right away if:  You have unusual pain at the radial site.  The catheter insertion area swells very fast.  The insertion area is bleeding, and the bleeding does not stop when you hold steady pressure on the area.  Your arm or hand becomes  pale, cool, tingly, or numb. These symptoms may represent a serious problem that is an emergency. Do not wait to see if the symptoms will go away. Get medical help right away. Call your local emergency services (911 in the [place] Do not drive yourself to the hospital. Summary  After the procedure, it is common to have bruising and tenderness at the site.  Follow instructions from your health care provider about how to take care of your radial site wound. Check the wound every day for signs of infection.  Do not lift anything that is heavier than 10 lb (4.5  kg), or the limit that you are told, until your health care provider says that it is safe. This information is not intended to replace advice given to you by your health care provider. Make sure you discuss any questions you have with your health care provider. Document Released: 03/25/2010 Document Revised: 03/28/2017 Document Reviewed: 03/28/2017 Elsevier Patient Education  2020 [employer].

## 2019-02-14 ENCOUNTER — Ambulatory Visit (HOSPITAL_COMMUNITY)
Admission: RE | Admit: 2019-02-14 | Discharge: 2019-02-14 | Disposition: A | Discharge status: Home / Self Care | Payer: Medicare Other | Attending: Interventional Cardiology | Admitting: Interventional Cardiology

## 2019-02-14 ENCOUNTER — Ambulatory Visit (HOSPITAL_COMMUNITY)
Admission: RE | Disposition: A | Discharge status: Home / Self Care | Payer: Self-pay | Source: Home / Self Care | Attending: Interventional Cardiology

## 2019-02-14 DIAGNOSIS — E782 Mixed hyperlipidemia: Secondary | ICD-10-CM | POA: Insufficient documentation

## 2019-02-14 DIAGNOSIS — F909 Attention-deficit hyperactivity disorder, unspecified type: Secondary | ICD-10-CM | POA: Diagnosis not present

## 2019-02-14 DIAGNOSIS — I25119 Atherosclerotic heart disease of native coronary artery with unspecified angina pectoris: Secondary | ICD-10-CM

## 2019-02-14 DIAGNOSIS — M1990 Unspecified osteoarthritis, unspecified site: Secondary | ICD-10-CM | POA: Insufficient documentation

## 2019-02-14 DIAGNOSIS — E7800 Pure hypercholesterolemia, unspecified: Secondary | ICD-10-CM | POA: Diagnosis present

## 2019-02-14 DIAGNOSIS — I209 Angina pectoris, unspecified: Secondary | ICD-10-CM

## 2019-02-14 DIAGNOSIS — E785 Hyperlipidemia, unspecified: Secondary | ICD-10-CM | POA: Diagnosis present

## 2019-02-14 DIAGNOSIS — Z6828 Body mass index (BMI) 28.0-28.9, adult: Secondary | ICD-10-CM | POA: Diagnosis not present

## 2019-02-14 DIAGNOSIS — Z7982 Long term (current) use of aspirin: Secondary | ICD-10-CM | POA: Diagnosis not present

## 2019-02-14 DIAGNOSIS — I2510 Atherosclerotic heart disease of native coronary artery without angina pectoris: Secondary | ICD-10-CM | POA: Diagnosis present

## 2019-02-14 DIAGNOSIS — E669 Obesity, unspecified: Secondary | ICD-10-CM | POA: Diagnosis not present

## 2019-02-14 DIAGNOSIS — Z87891 Personal history of nicotine dependence: Secondary | ICD-10-CM | POA: Diagnosis not present

## 2019-02-14 DIAGNOSIS — Z95820 Peripheral vascular angioplasty status with implants and grafts: Secondary | ICD-10-CM

## 2019-02-14 DIAGNOSIS — I10 Essential (primary) hypertension: Secondary | ICD-10-CM | POA: Insufficient documentation

## 2019-02-14 DIAGNOSIS — Z955 Presence of coronary angioplasty implant and graft: Secondary | ICD-10-CM | POA: Diagnosis not present

## 2019-02-14 DIAGNOSIS — Z79899 Other long term (current) drug therapy: Secondary | ICD-10-CM | POA: Insufficient documentation

## 2019-02-14 DIAGNOSIS — N400 Enlarged prostate without lower urinary tract symptoms: Secondary | ICD-10-CM | POA: Insufficient documentation

## 2019-02-14 HISTORY — PX: CORONARY STENT INTERVENTION: CATH118234

## 2019-02-14 LAB — BASIC METABOLIC PANEL
Anion gap: 9 (ref 5–15)
BUN: 17 mg/dL (ref 8–23)
CO2: 26 mmol/L (ref 22–32)
Calcium: 8.9 mg/dL (ref 8.9–10.3)
Chloride: 105 mmol/L (ref 98–111)
Creatinine, Ser: 0.97 mg/dL (ref 0.61–1.24)
GFR calc Af Amer: >60 mL/min (ref >60)
GFR calc non Af Amer: >60 mL/min (ref >60)
Glucose, Bld: 109 mg/dL — ABNORMAL HIGH (ref 70–99)
Potassium: 5.3 mmol/L — ABNORMAL HIGH (ref 3.5–5.1)
Sodium: 140 mmol/L (ref 135–145)

## 2019-02-14 LAB — POTASSIUM: Potassium: 3.4 mmol/L — ABNORMAL LOW (ref 3.5–5.1)

## 2019-02-14 LAB — POCT ACTIVATED CLOTTING TIME: Activated Clotting Time: 340 seconds

## 2019-02-14 SURGERY — CORONARY STENT INTERVENTION
Anesthesia: LOCAL

## 2019-02-14 MED ORDER — NITROGLYCERIN 0.4 MG SL SUBL: 0.4000 mg | SUBLINGUAL_TABLET | SUBLINGUAL | Status: DC | PRN

## 2019-02-14 MED ORDER — CO Q-10 200 MG PO CAPS: 200.0000 mg | ORAL_CAPSULE | Freq: Every day | ORAL | Status: DC

## 2019-02-14 MED ORDER — CLOPIDOGREL BISULFATE 75 MG PO TABS: 75.0000 mg | ORAL_TABLET | Freq: Every day | ORAL | Status: DC

## 2019-02-14 MED ORDER — SODIUM CHLORIDE 0.9 % IV SOLN
INTRAVENOUS | Status: DC | PRN
  Administered 2019-02-14: 1.75 mg/kg/h via INTRAVENOUS

## 2019-02-14 MED ORDER — SODIUM CHLORIDE 0.9 % WEIGHT BASED INFUSION
3.0000 mL/kg/h | INTRAVENOUS | Status: AC
  Administered 2019-02-14: 3 mL/kg/h via INTRAVENOUS

## 2019-02-14 MED ORDER — ONDANSETRON HCL 4 MG/2ML IJ SOLN: 4.0000 mg | Freq: Four times a day (QID) | INTRAMUSCULAR | Status: DC | PRN

## 2019-02-14 MED ORDER — SODIUM CHLORIDE 0.9% FLUSH: 3.0000 mL | INTRAVENOUS | Status: DC | PRN

## 2019-02-14 MED ORDER — LABETALOL HCL 5 MG/ML IV SOLN: 10.0000 mg | INTRAVENOUS | Status: AC | PRN

## 2019-02-14 MED ORDER — LIDOCAINE HCL (PF) 1 % IJ SOLN
INTRAMUSCULAR | Status: AC
  Filled 2019-02-14: qty 30

## 2019-02-14 MED ORDER — SODIUM CHLORIDE 0.9 % WEIGHT BASED INFUSION: 1.0000 mL/kg/h | INTRAVENOUS | Status: DC

## 2019-02-14 MED ORDER — MIDAZOLAM HCL 2 MG/2ML IJ SOLN
INTRAMUSCULAR | Status: DC | PRN
  Administered 2019-02-14 (×2): 1 mg via INTRAVENOUS

## 2019-02-14 MED ORDER — AMPHETAMINE-DEXTROAMPHETAMINE 5 MG PO TABS: 5.0000 mg | ORAL_TABLET | Freq: Two times a day (BID) | ORAL | Status: DC

## 2019-02-14 MED ORDER — ASPIRIN 81 MG PO CHEW: 81.0000 mg | CHEWABLE_TABLET | ORAL | Status: DC

## 2019-02-14 MED ORDER — CLOPIDOGREL BISULFATE 75 MG PO TABS: 75.0000 mg | ORAL_TABLET | Freq: Every day | ORAL | 1 refills | Status: DC

## 2019-02-14 MED ORDER — VITAMIN D3 50 MCG (2000 UT) PO CAPS: 2000.0000 [IU] | ORAL_CAPSULE | Freq: Every day | ORAL | Status: DC

## 2019-02-14 MED ORDER — SODIUM CHLORIDE 0.9 % IV SOLN: 250.0000 mL | INTRAVENOUS | Status: DC | PRN

## 2019-02-14 MED ORDER — ATORVASTATIN CALCIUM 20 MG PO TABS: 20.0000 mg | ORAL_TABLET | Freq: Every day | ORAL | Status: DC

## 2019-02-14 MED ORDER — AMLODIPINE BESYLATE 5 MG PO TABS: 2.5000 mg | ORAL_TABLET | Freq: Every day | ORAL | Status: DC

## 2019-02-14 MED ORDER — SODIUM CHLORIDE 0.9% FLUSH: 3.0000 mL | Freq: Two times a day (BID) | INTRAVENOUS | Status: DC

## 2019-02-14 MED ORDER — PANTOPRAZOLE SODIUM 40 MG PO TBEC: 40.0000 mg | DELAYED_RELEASE_TABLET | Freq: Every day | ORAL | Status: DC

## 2019-02-14 MED ORDER — ASPIRIN EC 81 MG PO TBEC: 81.0000 mg | DELAYED_RELEASE_TABLET | Freq: Every day | ORAL | Status: DC

## 2019-02-14 MED ORDER — ACETAMINOPHEN 325 MG PO TABS: 650.0000 mg | ORAL_TABLET | ORAL | Status: DC | PRN

## 2019-02-14 MED ORDER — NITROGLYCERIN 1 MG/10 ML FOR IR/CATH LAB
INTRA_ARTERIAL | Status: AC
  Filled 2019-02-14: qty 10

## 2019-02-14 MED ORDER — HEPARIN (PORCINE) IN NACL 1000-0.9 UT/500ML-% IV SOLN
INTRAVENOUS | Status: AC
  Filled 2019-02-14: qty 1000

## 2019-02-14 MED ORDER — PANTOPRAZOLE SODIUM 40 MG PO TBEC: 40.0000 mg | DELAYED_RELEASE_TABLET | Freq: Every day | ORAL | 1 refills | Status: DC

## 2019-02-14 MED ORDER — LIDOCAINE HCL (PF) 1 % IJ SOLN
INTRAMUSCULAR | Status: DC | PRN
  Administered 2019-02-14: 15 mL

## 2019-02-14 MED ORDER — BIVALIRUDIN BOLUS VIA INFUSION - CUPID
INTRAVENOUS | Status: DC | PRN
  Administered 2019-02-14: 60.525 mg via INTRAVENOUS

## 2019-02-14 MED ORDER — CARBOXYMETHYLCELLUL-GLYCERIN 0.5-0.9 % OP SOLN: 1.0000 [drp] | Freq: Every day | OPHTHALMIC | Status: DC | PRN

## 2019-02-14 MED ORDER — ANGIOPLASTY BOOK
Status: AC
  Filled 2019-02-14: qty 1

## 2019-02-14 MED ORDER — CALCIUM CARBONATE 1500 (600 CA) MG PO TABS: 1500.0000 mg | ORAL_TABLET | Freq: Every day | ORAL | Status: DC

## 2019-02-14 MED ORDER — FENTANYL CITRATE (PF) 100 MCG/2ML IJ SOLN
INTRAMUSCULAR | Status: AC
  Filled 2019-02-14: qty 2

## 2019-02-14 MED ORDER — VITAMIN B-12 2500 MCG SL SUBL: 2500.0000 ug | SUBLINGUAL_TABLET | Freq: Every day | SUBLINGUAL | Status: DC

## 2019-02-14 MED ORDER — BIVALIRUDIN TRIFLUOROACETATE 250 MG IV SOLR
INTRAVENOUS | Status: AC
  Filled 2019-02-14: qty 250

## 2019-02-14 MED ORDER — IOHEXOL 350 MG/ML SOLN
INTRAVENOUS | Status: DC | PRN
  Administered 2019-02-14: 115 mL via INTRACARDIAC

## 2019-02-14 MED ORDER — ASPIRIN 81 MG PO CHEW: 81.0000 mg | CHEWABLE_TABLET | Freq: Every day | ORAL | Status: DC

## 2019-02-14 MED ORDER — FENTANYL CITRATE (PF) 100 MCG/2ML IJ SOLN
INTRAMUSCULAR | Status: DC | PRN
  Administered 2019-02-14: 25 ug via INTRAVENOUS

## 2019-02-14 MED ORDER — HYDRALAZINE HCL 20 MG/ML IJ SOLN: 10.0000 mg | INTRAMUSCULAR | Status: AC | PRN

## 2019-02-14 MED ORDER — ATORVASTATIN CALCIUM 20 MG PO TABS: 20.0000 mg | ORAL_TABLET | Freq: Every day | ORAL | 6 refills | Status: DC

## 2019-02-14 MED ORDER — CLOPIDOGREL BISULFATE 75 MG PO TABS
75.0000 mg | ORAL_TABLET | ORAL | Status: AC
  Administered 2019-02-14: 75 mg via ORAL
  Filled 2019-02-14: qty 1

## 2019-02-14 MED ORDER — MIDAZOLAM HCL 2 MG/2ML IJ SOLN
INTRAMUSCULAR | Status: AC
  Filled 2019-02-14: qty 2

## 2019-02-14 MED ORDER — HEPARIN SODIUM (PORCINE) 1000 UNIT/ML IJ SOLN
INTRAMUSCULAR | Status: AC
  Filled 2019-02-14: qty 1

## 2019-02-14 MED ORDER — VERAPAMIL HCL 2.5 MG/ML IV SOLN
INTRAVENOUS | Status: AC
  Filled 2019-02-14: qty 2

## 2019-02-14 MED ORDER — NITROGLYCERIN 1 MG/10 ML FOR IR/CATH LAB
INTRA_ARTERIAL | Status: DC | PRN
  Administered 2019-02-14: 200 ug via INTRACORONARY

## 2019-02-14 MED ORDER — OXYCODONE HCL 5 MG PO TABS: 5.0000 mg | ORAL_TABLET | ORAL | Status: DC | PRN

## 2019-02-14 MED ORDER — HEPARIN (PORCINE) IN NACL 1000-0.9 UT/500ML-% IV SOLN
INTRAVENOUS | Status: DC | PRN
  Administered 2019-02-14 (×2): 500 mL

## 2019-02-14 MED FILL — CLOPIDOGREL 75 MG TABLET: 75 | 90 days supply | Qty: 90 | Fill #0

## 2019-02-14 MED FILL — PANTOPRAZOLE SOD DR 40 MG T: 40 | 30 days supply | Qty: 30 | Fill #0

## 2019-02-14 SURGICAL SUPPLY — 16 items
BALLN EMERGE MR 2.5X12 (BALLOONS) ×2
BALLOON EMERGE MR 2.5X12 (BALLOONS) ×1 IMPLANT
CATH VISTA GUIDE 6FR JR4 (CATHETERS) ×2 IMPLANT
CATH VISTA GUIDE 6FR XBLAD3.5 (CATHETERS) ×2 IMPLANT
KIT ENCORE 26 ADVANTAGE (KITS) ×2 IMPLANT
KIT HEART LEFT (KITS) ×2 IMPLANT
PACK CARDIAC CATHETERIZATION (CUSTOM PROCEDURE TRAY) ×2 IMPLANT
PINNACLE LONG 6F 25CM (SHEATH) ×2
SHEATH INTRO PINNACLE 6F 25CM (SHEATH) ×1 IMPLANT
SHEATH PINNACLE 6F 10CM (SHEATH) ×2 IMPLANT
SHEATH PROBE COVER 6X72 (BAG) ×2 IMPLANT
STENT RESOLUTE ONYX 2.75X15 (Permanent Stent) ×2 IMPLANT
TRANSDUCER W/STOPCOCK (MISCELLANEOUS) ×2 IMPLANT
TUBING CIL FLEX 10 FLL-RA (TUBING) ×2 IMPLANT
WIRE ASAHI PROWATER 180CM (WIRE) ×2 IMPLANT
WIRE EMERALD 3MM-J .035X150CM (WIRE) ×2 IMPLANT

## 2019-02-14 NOTE — Progress Notes (Signed)
[id number] Discussed with pt the importance of plavix with stent. Reviewed NTG use, heart healthy food choices, walking for ex, and CRP 2. Pt voiced understanding. Referred to [place] CRP 2.  Graylon Good RN BSN 02/14/2019 2:17 PM

## 2019-02-14 NOTE — Progress Notes (Signed)
Report given to Irving Burton who will assume care at this time

## 2019-02-14 NOTE — Discharge Instructions (Signed)
Coronary Angiogram With Stent, Care After This sheet gives you information about how to care for yourself after your procedure. Your health care provider may also give you more specific instructions. If you have problems or questions, contact your health care provider. What can I expect after the procedure? After your procedure, it is common to have:  Bruising in the area where a small, thin tube (catheter) was inserted. This usually fades within 1-2 weeks.  Blood collecting in the tissue (hematoma) that may be painful to the touch. It should usually decrease in size and tenderness within 1-2 weeks. Follow these instructions at home: Insertion area care  Do not take baths, swim, or use a hot tub until your health care provider approves.  You may shower 24-48 hours after the procedure or as directed by your health care provider.  Follow instructions from your health care provider about how to take care of your incision. Make sure you: ? Wash your hands with soap and water before you change your bandage (dressing). If soap and water are not available, use hand sanitizer. ? Change your dressing as told by your health care provider. ? Leave stitches (sutures), skin glue, or adhesive strips in place. These skin closures may need to stay in place for 2 weeks or longer. If adhesive strip edges start to loosen and curl up, you may trim the loose edges. Do not remove adhesive strips completely unless your health care provider tells you to do that.  Remove the bandage (dressing) and gently wash the catheter insertion site with plain soap and water.  Pat the area dry with a clean towel. Do not rub the area, because that may cause bleeding.  Do not apply powder or lotion to the incision area.  Check your incision area every day for signs of infection. Check for: ? More redness, swelling, or pain. ? More fluid or blood. ? Warmth. ? Pus or a bad smell. Activity  Do not drive for 24 hours if you  were given a medicine to help you relax (sedative).  Do not lift anything that is heavier than 10 lb (4.5 kg) for 5 days after your procedure or as directed by your health care provider.  Ask your health care provider when it is okay for you: ? To return to work or school. ? To resume usual physical activities or sports. ? To resume sexual activity. Eating and drinking   Eat a heart-healthy diet. This should include plenty of fresh fruits and vegetables.  Avoid the following types of food: ? Food that is high in salt. ? Canned or highly processed food. ? Food that is high in saturated fat or sugar. ? [employer].  Limit alcohol intake to no more than 1 drink a day for non-pregnant women and 2 drinks a day for men. One drink equals 12 oz of beer, 5 oz of wine, or 1 oz of hard liquor. Lifestyle   Do not use any products that contain nicotine or tobacco, such as cigarettes and e-cigarettes. If you need help quitting, ask your health care provider.  Take steps to manage and control your weight.  Get regular exercise.  Manage your blood pressure.  Manage other health problems, such as diabetes. General instructions  Take over-the-counter and prescription medicines only as told by your health care provider. Blood thinners may be prescribed after your procedure to improve blood flow through the stent.  If you need an MRI after your heart stent has been placed,  be sure to tell the health care provider who orders the MRI that you have a heart stent.  Keep all follow-up visits as directed by your health care provider. This is important. Contact a health care provider if:  You have a fever.  You have chills.  You have increased bleeding from the catheter insertion area. Hold pressure on the area. Get help right away if:  You develop chest pain or shortness of breath.  You feel faint or you pass out.  You have unusual pain at the catheter insertion area.  You have redness,  warmth, or swelling at the catheter insertion area.  You have drainage (other than a small amount of blood on the dressing) from the catheter insertion area.  The catheter insertion area is bleeding, and the bleeding does not stop after 30 minutes of holding steady pressure on the area.  You develop bleeding from any other place, such as from your rectum. There may be bright red blood in your urine or stool, or it may appear as black, tarry stool. This information is not intended to replace advice given to you by your health care provider. Make sure you discuss any questions you have with your health care provider. Document Released: 09/09/2004 Document Revised: 02/02/2017 Document Reviewed: 11/18/2015 Elsevier Patient Education  2020 [place] about your medication: Plavix (anti-platelet agent)  Generic Name (Brand): clopidogrel (Plavix), once daily medication  PURPOSE: You are taking this medication along with aspirin to lower your chance of having a heart attack, stroke, or blood clots in your heart stent. These can be fatal. Brilinta and aspirin help prevent platelets from sticking together and forming a clot that can block an artery or your stent.   Common SIDE EFFECTS you may experience include: bruising or bleeding more easily, shortness of breath  Do not stop taking PLAVIX without talking to the doctor who prescribes it for you. People who are treated with a stent and stop taking Plavix too soon, have a higher risk of getting a blood clot in the stent, having a heart attack, or dying. If you stop Plavix because of bleeding, or for other reasons, your risk of a heart attack or stroke may increase.   Tell all of your doctors and dentists that you are taking Plavix. They should talk to the doctor who prescribed plavix for you before you have any surgery or invasive procedure.   Contact your health care provider if you experience: severe or uncontrollable bleeding,  pink/red/brown urine, vomiting blood or vomit that looks like "coffee grounds", red or black stools (looks like tar), coughing up blood or blood clots ----------------------------------------------------------------------------------------------------------------------

## 2019-02-14 NOTE — Interval H&P Note (Signed)
Cath Lab Visit (complete for each Cath Lab visit)  Clinical Evaluation Leading to the Procedure:   ACS: No.  Non-ACS:    Anginal Classification: CCS Martinez  Anti-ischemic medical therapy: Maximal Therapy (2 or more classes of medications)  Non-Invasive Test Results: Low-risk stress test findings: cardiac mortality <1%/year  Prior CABG: No previous CABG      History and Physical Interval Note:  02/14/2019 7:24 AM  Cole Martinez  has presented today for surgery, with the diagnosis of CAD.  The various methods of treatment have been discussed with the patient and family. After consideration of risks, benefits and other options for treatment, the patient has consented to  Procedure(s): CORONARY STENT INTERVENTION (N/A) as a surgical intervention.  The patient's history has been reviewed, patient examined, no change in status, stable for surgery.  I have reviewed the patient's chart and labs.  Questions were answered to the patient's satisfaction.     Cole Martinez

## 2019-02-14 NOTE — CV Procedure (Signed)
   95% proximal to mid RCA treated with 15 x 2.75 Onyx to 14 atm x 2 after predilatation with TIMI grade III flow.  Angiography using guide catheter of left coronary system demonstrated diffuse 50 to 60% LAD ISR without high-grade focal obstruction.  Not against intervention.  Recent nuclear study did not demonstrate anterior wall ischemia.  Difficult procedure from the femoral due to major tortuosity in the iliac distal aorta and femoral requiring a 25 cm 6 French sheath to allow torque ability of guide catheters.  Still difficult despite the long sheath.  Plan to pull sheath, ambulate late today.  If no bleeding can be same day discharge.

## 2019-02-14 NOTE — Progress Notes (Signed)
Up and walked and tolerated well; right groin stable, no bleeding or hematoma

## 2019-02-14 NOTE — Progress Notes (Signed)
Site area: rt groin fa sheath Site Prior to Removal:  Level 0 Pressure Applied For: 25 minutes Manual:   yes Patient Status During Pull:  stable Post Pull Site:  Level  0 Post Pull Instructions Given:  yes Post Pull Pulses Present: rt dp palpable Dressing Applied:  Gauze and tegaderm Bedrest begins @ 1110 Comments:

## 2019-02-14 NOTE — Discharge Summary (Addendum)
The patient has been seen in conjunction with Cole Bellis, NP. All aspects of care have been considered and discussed. The patient has been personally interviewed, examined, and all clinical data has been reviewed.   No post procedure complications.  No angina  Educated on progression.  ASA and Plavix will continue.    Discharge Summary for Same Day PCI   Patient ID: Cole Martinez MRN: [id number]; DOB: [birth year]  Admit date: 02/14/2019 Discharge date: 02/14/2019  Primary Care Provider: Josetta Huddle, MD  Primary Cardiologist: Cole Grooms, MD  Primary Electrophysiologist:  None   Discharge Diagnoses    Principal Problem:   Angina pectoris Cole Martinez) Active Problems:   Coronary artery disease involving native heart   Hyperlipidemia   Essential hypertension    Diagnostic Studies/Procedures    Cardiac Catheterization 02/13/2019:   Widely patent left main  70 to 80% ISR and previously placed proximal LAD bare-metal stent.  The stent crosses over a large diagonal which contains 85% in the ostium due to jailing.  Circumflex gives origin to 3 marginals.  No significant obstruction is noted.  RCA is severely obstructed, with eccentric 90 to 95% stenosis.  Mild mid anterior wall hypokinesis.  EF 45 to 50%.  RECOMMENDATIONS:   We discussed treatment options that could include two-vessel coronary bypass grafting including a graft to the a relatively large diagonal that is jailed by the previously placed LAD stent versus culprit stenting of the RCA and medical follow-up of the LAD versus RCA and LAD stenting.  LAD stenting would carry a slightly higher acute ischemic complication risk as the diagonal would be threatened due to a double layer of stent.  After discussing with the patient and his wife we will proceed with percutaneous therapy and perform surgery after/if percutaneous options are exhausted.  CT will need to be performed from the femoral approach  which will have a slightly increased risk of bleeding.  Plan today will be to go home and come back in a.m. for double vessel PCI.  Diagnostic Dominance: Right    Cardiac Catheterization 02/14/2019:   95% RCA reduced to 0% with TIMI grade III flow using a 2.75 x 15 Onyx deployed at 14 atm.    Angiographic reevaluation of LAD in-stent restenosis demonstrated 50 to 70%.  In absence of ischemia noted on recent nuclear study in the anteroapical segment the decision was made to continue medical therapy.  RECOMMENDATIONS:   Plan same-day discharge assuming no groin complications.  Increase intensity of statin therapy from 20 mg atorvastatin every other day to daily dosing.  Change proton pump inhibitor to Protonix.  Aspirin and clopidogrel times at least 6 months.  Statin panel should be done in 6 weeks.  Continue beta-blocker therapy.  Needs follow-up with me or team member in 7 to 10 days.  Diagnostic Dominance: Right  Intervention    _____________   History of Present Illness     Cole Martinez is a 73 y.o. male with PMH of remote CAD/PCI, HL, HTN and obesity. He is followed by Dr. Tamala Martinez as an outpatient. Presented to the office with reports of recurrent angina. Attempts were made to further titrate his medications, but when he presented back to the office on 02/07/19 reported on chest discomfort. It occurred with activity.  It resolved with rest. Perhaps slightly better since he was started on metoprolol.  Myocardial perfusion imaging was normal recently.  He was still feeling short of breath when he walked. Given  his ongoing symptoms, outpatient cardiac catheterization was arranged for further evaluation.  Underwent cardiac catheterization on 02/13/2019 showing 70 to 80% in-stent restenosis of previously placed proximal LAD bare-metal stent.  RCA with severely obstructed lesion of 90 to 95%.  Options were discussed with the patient and he was loaded with Plavix with plans  to bring back the following day for a femoral approach and double vessel PCI.  Martinez Course     The patient underwent cardiac cath as noted above with successful PCI/DES x1 to RCA.  In the absence of ischemia noted on recent nuclear study in the anterior apical segment the decision was made to treat in-stent stenosis within the LAD medically.  Plan for DAPT with ASA/Plavix for at least 6 months. The patient was seen by cardiac rehab while in short stay. There were no observed complications post cath.  Right femoral cath site was re-evaluated prior to discharge and found to be stable without any complications.   Cole Martinez was seen by Dr. Tamala Martinez and determined stable for discharge home. Follow up with our office has been arranged. Instructions/precautions regarding cath site care were given prior to discharge. Medications are listed below. Pertinent changes include addition of plavix.  _____________  Did the patient have an acute coronary syndrome (MI, NSTEMI, STEMI, etc) this admission?:  No                               Did the patient have a percutaneous coronary intervention (stent / angioplasty)?:  Yes.     Cath/PCI Registry Performance & Quality Measures: 5. Aspirin prescribed? - Yes 6. ADP Receptor Inhibitor (Plavix/Clopidogrel, Brilinta/Ticagrelor or Effient/Prasugrel) prescribed (includes medically managed patients)? - Yes 7. High Intensity Statin (Lipitor 40-20m or Crestor 20-452m prescribed? - No, defer increase to PCP cardiologist 8. For EF <40%, was ACEI/ARB prescribed? - Not Applicable (EF >/= 4063%9. For EF <40%, Aldosterone Antagonist (Spironolactone or Eplerenone) prescribed? - Not Applicable (EF >/= 4089%10. Cardiac Rehab Phase II ordered (Included Medically managed Patients)? - Yes   _____________   Discharge Vitals Blood pressure 136/62, pulse 66, temperature (!) 97.2 F (36.2 C), temperature source Skin, resp. rate 18, height 5' 7.5" (1.715 m), weight 80.7 kg,  SpO2 97 %.  Filed Weights   02/14/19 0559  Weight: 80.7 kg    Last Labs & Radiologic Studies    CBC No results for input(s): WBC, NEUTROABS, HGB, HCT, MCV, PLT in the last 72 hours. Basic Metabolic Panel Recent Labs    02/14/19 0725  NA 140  K 5.3*  CL 105  CO2 26  GLUCOSE 109*  BUN 17  CREATININE 0.97  CALCIUM 8.9   Liver Function Tests No results for input(s): AST, ALT, ALKPHOS, BILITOT, PROT, ALBUMIN in the last 72 hours. No results for input(s): LIPASE, AMYLASE in the last 72 hours. High Sensitivity Troponin:   No results for input(s): TROPONINIHS in the last 720 hours.  BNP Invalid input(s): POCBNP D-Dimer No results for input(s): DDIMER in the last 72 hours. Hemoglobin A1C No results for input(s): HGBA1C in the last 72 hours. Fasting Lipid Panel No results for input(s): CHOL, HDL, LDLCALC, TRIG, CHOLHDL, LDLDIRECT in the last 72 hours. Thyroid Function Tests No results for input(s): TSH, T4TOTAL, T3FREE, THYROIDAB in the last 72 hours.  Invalid input(s): FREET3 _____________  CARDIAC CATHETERIZATION  Result Date: 02/14/2019  95% RCA reduced to 0% with TIMI grade III flow using  a 2.75 x 15 Onyx deployed at 14 atm.   Angiographic reevaluation of LAD in-stent restenosis demonstrated 50 to 70%.  In absence of ischemia noted on recent nuclear study in the anteroapical segment the decision was made to continue medical therapy. RECOMMENDATIONS:  Plan same-day discharge assuming no groin complications.  Increase intensity of statin therapy from 20 mg atorvastatin every other day to daily dosing.  Change proton pump inhibitor to Protonix.  Aspirin and clopidogrel times at least 6 months.  Statin panel should be done in 6 weeks.  Continue beta-blocker therapy.  Needs follow-up with me or team member in 7 to 10 days.   CARDIAC CATHETERIZATION  Result Date: 02/13/2019  Widely patent left main  70 to 80% ISR and previously placed proximal LAD bare-metal stent.   The stent crosses over a large diagonal which contains 85% in the ostium due to jailing.  Circumflex gives origin to 3 marginals.  No significant obstruction is noted.  RCA is severely obstructed, with eccentric 90 to 95% stenosis.  Mild mid anterior wall hypokinesis.  EF 45 to 50%. RECOMMENDATIONS:  We discussed treatment options that could include two-vessel coronary bypass grafting including a graft to the a relatively large diagonal that is jailed by the previously placed LAD stent versus culprit stenting of the RCA and medical follow-up of the LAD versus RCA and LAD stenting.  LAD stenting would carry a slightly higher acute ischemic complication risk as the diagonal would be threatened due to a double layer of stent.  After discussing with the patient and his wife we will proceed with percutaneous therapy and perform surgery after/if percutaneous options are exhausted.  CT will need to be performed from the femoral approach which will have a slightly increased risk of bleeding.  Plan today will be to go home and come back in a.m. for double vessel PCI.  Myocardial Perfusion Imaging  Result Date: 01/24/2019  Nuclear stress EF: 62%.  There was no ST segment deviation noted during stress.  The study is normal.  This is a low risk study.  The left ventricular ejection fraction is normal (55-65%).     Disposition   Pt is being discharged home today in good condition.  Follow-up Plans & Appointments    Follow-up Information    Tommie Raymond, NP Follow up on 03/03/2019.   Specialty: Cardiology Why: at 8:30am for your follow up appt. Contact information: [place] [address] [phone]          Discharge Instructions    Amb Referral to Cardiac Rehabilitation   Complete by: As directed    Diagnosis: Coronary Stents   After initial evaluation and assessments completed: Virtual Based Care may be provided alone or in conjunction with Phase 2 Cardiac  Rehab based on patient barriers.: Yes       Discharge Medications   Allergies as of 02/14/2019   No Known Allergies     Medication List    TAKE these medications   amLODipine 2.5 MG tablet Commonly known as: NORVASC TAKE 1 TABLET BY MOUTH  DAILY   amphetamine-dextroamphetamine 5 MG tablet Commonly known as: ADDERALL Take 5 mg by mouth 2 (two) times daily.   aspirin EC 81 MG tablet Take 81 mg by mouth daily.   atorvastatin 20 MG tablet Commonly known as: LIPITOR Take 1 tablet (20 mg total) by mouth daily at 6 PM. What changed: when to take this   calcium carbonate 1500 (600  Ca) MG Tabs tablet Commonly known as: OSCAL Take 1,500 mg by mouth daily.   clopidogrel 75 MG tablet Commonly known as: Plavix Take 1 tablet (75 mg total) by mouth daily.   Co Q-10 200 MG Caps Take 200 mg by mouth daily.   LUBRICATING EYE DROPS OP Place 1 drop into both eyes daily as needed (dry eyes).   metoprolol succinate 25 MG 24 hr tablet Commonly known as: TOPROL-XL Take 25 mg by mouth daily.   pantoprazole 40 MG tablet Commonly known as: Protonix Take 1 tablet (40 mg total) by mouth daily.   Vitamin B-12 2500 MCG Subl Place 2,500 mcg under the tongue daily.   Vitamin D3 50 MCG (2000 UT) capsule Take 2,000 Units by mouth daily.        Allergies No Known Allergies  Outstanding Labs/Studies   N/a   Duration of Discharge Encounter   Greater than 30 minutes including physician time.  Signed, Cole Bellis, NP 02/14/2019, 2:26 PM

## 2019-02-17 ENCOUNTER — Telehealth: Payer: Self-pay | Admitting: *Deleted

## 2019-02-17 ENCOUNTER — Telehealth (HOSPITAL_COMMUNITY): Payer: Self-pay

## 2019-02-17 MED FILL — Heparin Sodium (Porcine) Inj 1000 Unit/ML: INTRAMUSCULAR | Qty: 10 | Status: AC

## 2019-02-17 MED FILL — Verapamil HCl IV Soln 2.5 MG/ML: INTRAVENOUS | Qty: 2 | Status: AC

## 2019-02-17 NOTE — Telephone Encounter (Signed)
Pt insurance is active and benefits verified through Medicare A/B. Co-pay $0.00, DED $198.00/$198.00 met, out of pocket $0.00/$0.00 met, co-insurance 0%. No pre-authorization required. Passport, 02/17/2019 @ 10:47AM, OBS#[id number]  2ndary insurance is active and benefits verified through [place]. Co-pay $0.00, DED $0.00/$0.00 met, out of pocket $0.00/$0.00 met, co-insurance 0%. No pre-authorization required. Passport, 02/17/2019 @ 10:49AM, TKP#[id number]  Will pass to RN Navigator for review.

## 2019-02-17 NOTE — Telephone Encounter (Signed)
Asking if we will getting COVID vaccines to administer to patients. Informed him that vaccines for our patients will not be done anytime soon. There are not definite plans in place at this time. Some pharmacies will be able to provide these at a future date. Suggested he continue to watch the news to learn when community vaccines will start.

## 2019-02-18 NOTE — Progress Notes (Signed)
Cardiology Office Note   Date:  03/03/2019   ID:  Cole Martinez, DOB 1946/02/01, MRN [id number]  PCP:  Josetta Huddle, MD  Cardiologist: Dr. Tamala Julian, MD   Chief Complaint  Patient presents with  . Hospitalization Follow-up    History of Present Illness: Cole Martinez is a 73 y.o. male who presents for hospital follow-up, seen for Dr. Tamala Julian.  Cole Martinez has a history of remote CAD/PCI, HLD, HTN and obesity.  He presented to the office with reports of recurrent angina.  Attempts were made to further titrate his medications however on 02/07/2019 given his reported chest discomfort with activity with resolution at rest and outpatient cardiac cath was arranged for further evaluation despite a relatively normal myocardial perfusion study.  He underwent cardiac catheterization on 02/13/2019 showing a 70 to 80% in-stent restenosis of previously placed proximal LAD bare-metal stent.  RCA with severely obstructive lesion of 90 to 95%.  Options discussed with the patient and he was therefore loaded with Plavix with plans to bring back the following day for femoral approach and double vessel PCI.    The patient underwent cardiac cath as noted above with successful PCI/DES x1 to the RCA.  In the absence of ischemia noted on recent nuclear study in the anterior apical segment, the decision was made to treat in-stent stenosis within the LAD medically.  Plans were for DAPT with ASA/Plavix for at least 6 months.  The patient was seen and followed by cardiac rehabilitation while in the short stay center.  Right femoral cath was stable without signs or symptoms of hematoma or acute bleeding.  Today Cole Martinez presents alone for CAD follow-up.  He is doing well from a cardiac perspective.  Reports he has been feeling well since hospital discharge.  Is asking how long he needs to be on ASA and Plavix therapy given increased bruising.  We discussed a minimum of 6 months and final decision will come from Dr. Tamala Julian  as to when he can discontinue Plavix therapy.  Reports he has no anginal symptoms with activity however states that in heated arguments with his wife he will get mild chest/axillary pressure.  We discussed that he has residual CAD which can be managed medically.  He has disinterested in adding nitrates at this point.  We discussed increasing his amlodipine however he prefers to try coping mechanisms first with close follow-up.  He continues to walk daily.  He is up to 20 minutes but is asking to increase this further.  I recommended increasing to 40 minutes and if no symptoms, may increase back to 1 hour if he prefers.  BP is well controlled today.  No other complaints.   Past Medical History:  Diagnosis Date  . ADHD (attention deficit hyperactivity disorder)   . B12 deficiency   . BPH (benign prostatic hypertrophy)   . 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 Coronary artery disease CARDIOLOGIST- DR Daneen Schick   S/P STENTING LAD 1999 // Myoview 01/2019: EF 62, normal perfusion; Low Risk  . Crohn's disease of ileum ([place]) SINCE 1988  . ED (erectile dysfunction)   . Elevated PSA   . Essential and other specified forms of tremor 11/25/2012  . H/O adenomatous polyp of colon   . Hyperlipidemia   . Nocturia   . OA (osteoarthritis)   . Peripheral neuropathy  hx of, none current as of 08-04-13  . Peyronie disease   . S/P coronary artery stent placement OCT 1999 OF LAD  . Tremor, hereditary, benign MILD RIGHT HAND    Past Surgical History:  Procedure Laterality Date  . CARDIOVASCULAR STRESS TEST  11-01-2010 DR Daneen Schick   NORMAL PERFUSION STUDY/ EF 64%/ NO ISCHEMIA  . cataract surgery  Bilateral feburary 2020   with lens placement   . COLONOSCOPY WITH PROPOFOL N/A 09/24/2012   Procedure: COLONOSCOPY WITH PROPOFOL;  Surgeon: [name], MD;  Location: [facility];  Service:  Endoscopy;  Laterality: N/A;  . CORONARY ANGIOPLASTY WITH STENT PLACEMENT  OCT 1999   STENT OF LAD  . CORONARY STENT INTERVENTION N/A 02/14/2019   Procedure: CORONARY STENT INTERVENTION;  Surgeon: Belva Crome, MD;  Location: [facility];  Service: Cardiovascular;  Laterality: N/A;  . CYSTOSCOPY WITH URETHRAL DILATATION  03/12/2017   Procedure: CYSTOSCOPY WITH URETHRAL DILATATION;  Surgeon: Gaynelle Arabian, MD;  Location: [facility];  Service: Orthopedics;;  Ammie Dalton, Resident Assisting  . CYSTOSCOPY WITH URETHRAL DILATATION N/A 10/03/2018   Procedure: CYSTOSCOPY WITH URETHRAL BALLOON DILATATION WITH BILATERAL RETROGRADE PYELOGRAPHY;  Surgeon: Raynelle Bring, MD;  Location: [facility];  Service: Urology;  Laterality: N/A;  . LAPAROSCOPIC INGUINAL HERNIA REPAIR Bilateral 01-10-2004   W/ MESH  . LEFT HEART CATH AND CORONARY ANGIOGRAPHY N/A 02/13/2019   Procedure: LEFT HEART CATH AND CORONARY ANGIOGRAPHY;  Surgeon: Belva Crome, MD;  Location: [facility];  Service: Cardiovascular;  Laterality: N/A;  . neck benign removed from neck  yrs ago  . PROSTATE BIOPSY N/A 07/12/2012   Procedure: PROSTATE BIOPSY AND ULTRASOUND;  Surgeon: Ailene Rud, MD;  Location: [facility];  Service: Urology;  Laterality: N/A;  . PROSTATE SURGERY  2002   tuna  . REMOVAL LEFT NECK LYMPH NODE  1996  . TONSILLECTOMY  CHILD  . TOTAL KNEE ARTHROPLASTY Right 03/12/2017   Procedure: RIGHT TOTAL KNEE ARTHROPLASTY;  Surgeon: Gaynelle Arabian, MD;  Location: [facility];  Service: Orthopedics;  Laterality: Right;  Adductor Block  . TRANSURETHRAL RESECTION OF PROSTATE N/A 08/08/2013   Procedure: TRANSURETHRAL RESECTION OF THE PROSTATE WITH GYRUS INSTRUMENTS;  Surgeon: Ailene Rud, MD;  Location: [facility];  Service: Urology;  Laterality: N/A;  . TRANSURETHRAL RESECTION OF PROSTATE       Current Outpatient Medications  Medication Sig Dispense Refill  . amLODipine (NORVASC) 2.5 MG tablet TAKE 1 TABLET  BY MOUTH  DAILY 90 tablet 3  . amphetamine-dextroamphetamine (ADDERALL) 5 MG tablet Take 5 mg by mouth 2 (two) times daily.    Marland Kitchen aspirin EC 81 MG tablet Take 81 mg by mouth daily.     Marland Kitchen atorvastatin (LIPITOR) 20 MG tablet Take 1 tablet (20 mg total) by mouth daily at 6 PM. 30 tablet 6  . calcium carbonate (OSCAL) 1500 (600 Ca) MG TABS tablet Take 1,500 mg by mouth daily.     . Carboxymethylcellul-Glycerin (LUBRICATING EYE DROPS OP) Place 1 drop into both eyes daily as needed (dry eyes).    . Cholecalciferol (VITAMIN D3) 50 MCG (2000 UT) capsule Take 2,000 Units by mouth daily.     . clopidogrel (PLAVIX) 75 MG tablet Take 1 tablet (75 mg total) by mouth daily. 90 tablet 1  . Coenzyme Q10 (CO Q-10) 200 MG CAPS Take 200 mg by mouth daily.     . Cyanocobalamin (VITAMIN B-12) 2500 MCG SUBL Place 2,500 mcg under the  tongue daily.    . metoprolol succinate (TOPROL-XL) 25 MG 24 hr tablet Take 25 mg by mouth daily.    . pantoprazole (PROTONIX) 40 MG tablet Take 1 tablet (40 mg total) by mouth daily. 30 tablet 1   No current facility-administered medications for this visit.    Allergies:   Patient has no known allergies.    Social History:  The patient  reports that he quit smoking about 34 years ago. His smoking use included cigarettes. He has a 10.00 pack-year smoking history. He has never used smokeless tobacco. He reports current alcohol use of about 2.0 standard drinks of alcohol per week. He reports that he does not use drugs.   Family History:  The patient's family history includes Diabetes in his brother; Obesity in his brother; Parkinsonism in his brother.    ROS:  Please see the history of present illness.   Otherwise, review of systems are positive for none.   All other systems are reviewed and negative.    PHYSICAL EXAM: VS:  BP 128/78   Pulse 67   Ht 5' 7.5" (1.715 m)   Wt 179 lb (81.2 kg)   SpO2 100%   BMI 27.62 kg/m  , BMI Body mass index is 27.62 kg/m.   General: Well  developed, well nourished, NAD Neck: Negative for carotid bruits. No JVD Lungs:Clear to ausculation bilaterally. No wheezes, rales, or rhonchi. Breathing is unlabored. Cardiovascular: RRR with S1 S2. No murmurs Extremities: No edema. DP pulses 2+ bilaterally Neuro: Alert and oriented. No focal deficits. No facial asymmetry. MAE spontaneously. Psych: Responds to questions appropriately with normal affect.    EKG:  EKG is not ordered today.  Recent Labs: 02/07/2019: Hemoglobin 15.0; Platelets 243 02/14/2019: BUN 17; Creatinine, Ser 0.97; Potassium 3.4; Sodium 140    Lipid Panel    Component Value Date/Time   CHOL 154 12/14/2015 0806   TRIG 118 12/14/2015 0806   HDL 47 12/14/2015 0806   CHOLHDL 3.3 12/14/2015 0806   VLDL 24 12/14/2015 0806   LDLCALC 83 12/14/2015 0806      Wt Readings from Last 3 Encounters:  03/03/19 179 lb (81.2 kg)  02/14/19 177 lb 14.6 oz (80.7 kg)  02/13/19 178 lb (80.7 kg)      Other studies Reviewed: Additional studies/ records that were reviewed today include:   Cardiac Catheterization 02/13/2019:   Widely patent left main  70 to 80% ISR and previously placed proximal LAD bare-metal stent. The stent crosses over a large diagonal which contains 85% in the ostium due to jailing.  Circumflex gives origin to 3 marginals. No significant obstruction is noted.  RCA is severely obstructed, with eccentric 90 to 95% stenosis.  Mild mid anterior wall hypokinesis. EF 45 to 50%.  RECOMMENDATIONS:   We discussed treatment options that could include two-vessel coronary bypass grafting including a graft to the a relatively large diagonal that is jailed by the previously placed LAD stent versus culprit stenting of the RCA and medical follow-up of the LAD versus RCA and LAD stenting. LAD stenting would carry a slightly higher acute ischemic complication risk as the diagonal would be threatened due to a double layer of stent. After discussing with the  patient and his wife we will proceed with percutaneous therapy and perform surgery after/if percutaneous options are exhausted.  CT will need to be performed from the femoral approach which will have a slightly increased risk of bleeding. Plan today will be to go home and come back  in a.m. for double vessel PCI.  Diagnostic Dominance: Right    Cardiac Catheterization 02/14/2019:   95% RCA reduced to 0% with TIMI grade III flow using a 2.75 x 15 Onyx deployed at 14 atm.   Angiographic reevaluation of LAD in-stent restenosis demonstrated 50 to 70%. In absence of ischemia noted on recent nuclear study in the anteroapical segment the decision was made to continue medical therapy.  RECOMMENDATIONS:   Plan same-day discharge assuming no groin complications.  Increase intensity of statin therapy from 20 mg atorvastatin every other day to daily dosing.  Change proton pump inhibitor to Protonix.  Aspirin and clopidogrel times at least 6 months.  Statin panel should be done in 6 weeks.  Continue beta-blocker therapy.  Needs follow-up with me or team member in 7 to 10 days.  Diagnostic Dominance: Right  Intervention      ASSESSMENT AND PLAN:  1.  CAD s/p recent PCI to the RCA: -Has residual in-stent restenosis in the LAD which was noted to treat medically with DAPT with ASA and Plavix therapy for at least 6 months duration>>PCI to RCA -Femoral site without hematoma or bleeding  -Mild chest/axillary pressure with increased stress however not with exertion>>offered to increase amlodipine versus adding long acting nitrates however patient deferred both and would prefer close follow-up in 2 months. -ED precautions reviewed -Denies shortness of breath, recurrent exertional angina, diaphoresis  2.  HLD: -Continue atorvastatin  3.  Hypertension: -Stable, 128/78 -Continue Toprol-XL 25 mg daily, amlodipine 2.5  4.  Hyperkalemia/hypokalemia: -In review of patient's  most recent lab work, appears to have been hyperkalemic at 5.3 with repeat lab work at 3.4 -Repeat lab work today -Patient not on a diuretic or K+ supplementation   Current medicines are reviewed at length with the patient today.  The patient does not have concerns regarding medicines.  The following changes have been made:  no change  Labs/ tests ordered today include: BMET  No orders of the defined types were placed in this encounter.   Disposition:   FU with Dr. Tamala Julian in 2 months  Signed, Kathyrn Drown, NP  03/03/2019 8:44 AM    [address], [place], [place]  [address] Phone: [phone]; Fax: [phone]

## 2019-03-03 ENCOUNTER — Encounter: Payer: Self-pay | Admitting: Cardiology

## 2019-03-03 ENCOUNTER — Other Ambulatory Visit: Payer: Self-pay

## 2019-03-03 ENCOUNTER — Ambulatory Visit (INDEPENDENT_AMBULATORY_CARE_PROVIDER_SITE_OTHER): Payer: Medicare Other | Admitting: Cardiology

## 2019-03-03 VITALS — BP 128/78 | HR 67 | Ht 67.5 in | Wt 179.0 lb

## 2019-03-03 DIAGNOSIS — I10 Essential (primary) hypertension: Secondary | ICD-10-CM | POA: Diagnosis not present

## 2019-03-03 DIAGNOSIS — Z95820 Peripheral vascular angioplasty status with implants and grafts: Secondary | ICD-10-CM

## 2019-03-03 DIAGNOSIS — Z7189 Other specified counseling: Secondary | ICD-10-CM | POA: Diagnosis not present

## 2019-03-03 DIAGNOSIS — I2510 Atherosclerotic heart disease of native coronary artery without angina pectoris: Secondary | ICD-10-CM

## 2019-03-03 DIAGNOSIS — E782 Mixed hyperlipidemia: Secondary | ICD-10-CM

## 2019-03-03 DIAGNOSIS — E875 Hyperkalemia: Secondary | ICD-10-CM

## 2019-03-03 LAB — BASIC METABOLIC PANEL
BUN/Creatinine Ratio: 17 (ref 10–24)
BUN: 22 mg/dL (ref 8–27)
CO2: 23 mmol/L (ref 20–29)
Calcium: 9 mg/dL (ref 8.6–10.2)
Chloride: 104 mmol/L (ref 96–106)
Creatinine, Ser: 1.27 mg/dL (ref 0.76–1.27)
GFR calc Af Amer: 64 mL/min/{1.73_m2} (ref >59)
GFR calc non Af Amer: 55 mL/min/{1.73_m2} — ABNORMAL LOW (ref >59)
Glucose: 111 mg/dL — ABNORMAL HIGH (ref 65–99)
Potassium: 4.4 mmol/L (ref 3.5–5.2)
Sodium: 140 mmol/L (ref 134–144)

## 2019-03-03 MED ORDER — PANTOPRAZOLE SODIUM 40 MG PO TBEC: 40.0000 mg | DELAYED_RELEASE_TABLET | Freq: Every day | ORAL | 3 refills | Status: DC

## 2019-03-03 NOTE — Patient Instructions (Signed)
Medication Instructions:  Your physician recommends that you continue on your current medications as directed. Please refer to the Current Medication list given to you today.  *If you need a refill on your cardiac medications before your next appointment, please call your pharmacy*  Lab Work:  You will have labs drawn today: BMET  If you have labs (blood work) drawn today and your tests are completely normal, you will receive your results only by: Marland Kitchen MyChart Message (if you have MyChart) OR . A paper copy in the mail If you have any lab test that is abnormal or we need to change your treatment, we will call you to review the results.  Testing/Procedures:  None ordered today  Follow-Up: At [facility], you and your health needs are our priority.  As part of our continuing mission to provide you with exceptional heart care, we have created designated Provider Care Teams.  These Care Teams include your primary Cardiologist (physician) and Advanced Practice Providers (APPs -  Physician Assistants and Nurse Practitioners) who all work together to provide you with the care you need, when you need it.  Your next appointment:    05/19/2019 at 4:20PM with Daneen Schick, MD

## 2019-03-12 ENCOUNTER — Telehealth (HOSPITAL_COMMUNITY): Payer: Self-pay | Admitting: Pharmacist

## 2019-03-12 NOTE — Telephone Encounter (Signed)
Cardiac Rehab Medication Review by a Pharmacist  Does the patient  feel that his/her medications are working for him/her?  yes  Has the patient been experiencing any side effects to the medications prescribed?  Yes - reports excessive bruising with the clopidogrel. It is noted that patient is likely to only be on it for 6 months from initiation (started 02/2019, likely end date 08/2019). Also reports some joint pain he attributes to be from atorvastatin dose increase.   Does the patient measure his/her own blood pressure or blood glucose at home?  no   Does the patient have any problems obtaining medications due to transportation or finances?   no  Understanding of regimen: good Understanding of indications: excellent Potential of compliance: good    Pharmacist comments:  - patient reports that he takes acetaminophen as needed at night for joint pain he attributes to be from dose increase of atorvastatin (from every other day to daily). He reports the acetaminophen helps and he doesn't use it every night.   - drug interaction check ran for medications that may increase the bleeding risk with clopidogrel performed with no such interactions found. Of note, atorvastatin, pantoprazole, and amlodipine may actually decrease the effect of clopidogrel (category B and C interactions)  - patient requests to not be called on cell phone and for calls to go to work phone. Cell phone number removed from patient profile.    Thank you,   Eddie Candle, PharmD PGY-1 Pharmacy Resident   03/12/2019 2:07 PM

## 2019-03-17 ENCOUNTER — Telehealth: Payer: Self-pay | Admitting: Interventional Cardiology

## 2019-03-17 NOTE — Telephone Encounter (Signed)
We are recommending the COVID-19 vaccine to all of our patients. Cardiac medications (including blood thinners) should not deter anyone from being vaccinated and there is no need to hold any of those medications prior to vaccine administration.     Currently, there is a hotline to call (active 03/14/19) to schedule vaccination appointments as no walk-ins will be accepted.   Number: [phone]    If you have further questions or concerns about the vaccine process, please visit [email or web address] or contact your primary care physician.  I have informed patient of instructions.

## 2019-03-18 ENCOUNTER — Ambulatory Visit: Payer: Medicare Other | Attending: Internal Medicine

## 2019-03-18 DIAGNOSIS — Z23 Encounter for immunization: Secondary | ICD-10-CM | POA: Insufficient documentation

## 2019-03-18 NOTE — Progress Notes (Signed)
   Covid-19 Vaccination Clinic  Name:  INDALECIO MALMSTROM    MRN: [id number] DOB: [birth year]  03/18/2019  Mr. Prouty was observed post Covid-19 immunization for 30 minutes based on pre-vaccination screening without incidence. He was provided with Vaccine Information Sheet and instruction to access the V-Safe system.   Mr. Dibello was instructed to call 911 with any severe reactions post vaccine: Marland Kitchen Difficulty breathing  . Swelling of your face and throat  . A fast heartbeat  . A bad rash all over your body  . Dizziness and weakness    Immunizations Administered    Name Date Dose VIS Date Route   Pfizer COVID-19 Vaccine 03/18/2019 10:34 AM 0.3 mL 02/14/2019 Intramuscular   Manufacturer: [place]   Lot: [id number]   [place]: [id number]

## 2019-03-19 ENCOUNTER — Encounter (HOSPITAL_COMMUNITY)
Admission: RE | Admit: 2019-03-19 | Discharge: 2019-03-19 | Disposition: A | Discharge status: Home / Self Care | Payer: Self-pay | Source: Ambulatory Visit | Attending: Interventional Cardiology | Admitting: Interventional Cardiology

## 2019-03-19 ENCOUNTER — Telehealth (HOSPITAL_COMMUNITY): Payer: Self-pay | Admitting: *Deleted

## 2019-03-19 ENCOUNTER — Other Ambulatory Visit: Payer: Self-pay

## 2019-03-19 DIAGNOSIS — Z955 Presence of coronary angioplasty implant and graft: Secondary | ICD-10-CM | POA: Insufficient documentation

## 2019-03-19 NOTE — Telephone Encounter (Signed)
Spoke to Cole Martinez completed health history over the phone. Confirmed orientation cardiac rehab appointment for tomorrow. Pt is interested in participating in Virtual Cardiac and Pulmonary Rehab. Pt advised that Virtual Cardiac and Pulmonary Rehab is provided at no cost to the patient.  Checklist:  1. Pt has smart device  ie smartphone and/or ipad for downloading an app  Yes 2. Reliable internet/wifi service    Yes 3. Understands how to use their smartphone and navigate within an app.  Yes   Pt verbalized understanding and is in agreement.            Confirm Consent - In the setting of the current Covid19 crisis, you are scheduled for a phone visit with your Cardiac or Pulmonary team member.  Just as we do with many in-gym visits, in order for you to participate in this visit, we must obtain consent.  If you'd like, I can send this to your mychart (if signed up) or email for you to review.  Otherwise, I can obtain your verbal consent now.  By agreeing to a telephone visit, we'd like you to understand that the technology does not allow for your Cardiac or Pulmonary Rehab team member to perform a physical assessment, and thus may limit their ability to fully assess your ability to perform exercise programs. If your provider identifies any concerns that need to be evaluated in person, we will make arrangements to do so.  Finally, though the technology is pretty good, we cannot assure that it will always work on either your or our end and we cannot ensure that we have a secure connection.  Cardiac and Pulmonary Rehab Telehealth visits and "At Home" cardiac and pulmonary rehab are provided at no cost to you.        Are you willing to proceed?"        STAFF: Did the patient verbally acknowledge consent to telehealth visit? Document YES/NO here: Yes     Barnet Pall RN  Cardiac and Pulmonary Rehab Staff        Date 03/19/19    @ Time 11:16

## 2019-03-20 ENCOUNTER — Encounter (HOSPITAL_COMMUNITY): Payer: Self-pay

## 2019-03-20 ENCOUNTER — Encounter (HOSPITAL_COMMUNITY)
Admission: RE | Admit: 2019-03-20 | Discharge: 2019-03-20 | Disposition: A | Discharge status: Home / Self Care | Payer: Self-pay | Source: Ambulatory Visit | Attending: Interventional Cardiology | Admitting: Interventional Cardiology

## 2019-03-20 VITALS — BP 114/70 | HR 64 | Temp 97.9°F | Ht 67.0 in | Wt 180.8 lb

## 2019-03-20 DIAGNOSIS — Z955 Presence of coronary angioplasty implant and graft: Secondary | ICD-10-CM

## 2019-03-20 NOTE — Progress Notes (Signed)
Reviewed home exercise guidelines with patient including endpoints, temperature precautions, target heart rate and rate of perceived exertion. Pt is accumulating 60-60.25 minutes walking , 6-7 days/week as his mode of home exercise. Pt also has 3 & 5 lb. weights for his resistance training. Pt voices understanding of instructions given.  Sol Passer, MS, ACSM CEP 03/19/2018 [id number]

## 2019-03-20 NOTE — Progress Notes (Signed)
Cardiac Rehab Note:  Spoke to pt regarding Virtual Cardiac  and Pulmonary Rehab.  Pt  was able to download the [facility] app on their smart device with no issues. Pt set up their account and received the following welcome message -"Welcome to the [facility] and Pulmonary Rehabilitation program. We hope that you will find the exercise program beneficial in your recovery process. Our staff is available to assist with any questions/concerns about your exercise routine. Best wishes". Brief orientation provided to with the advisement to watch the "Intro to Rehab" series located under the Resource tab. Pt verbalized understanding. Will continue to follow and monitor pt progress with feedback as needed.  Karalyn Kadel E. Laray Anger, BSN

## 2019-03-20 NOTE — Progress Notes (Signed)
Cardiac Individual Treatment Plan  Patient Details  Name: Cole Martinez MRN: [id number] Date of Birth: Feb 03, 1946 Referring Provider:     CARDIAC REHAB PHASE II ORIENTATION from 03/20/2019 in [place]  Referring Provider  Belva Crome, MD      Initial Encounter Date:    CARDIAC REHAB PHASE II ORIENTATION from 03/20/2019 in [place]  Date  03/20/19      Visit Diagnosis: Status post coronary artery stent placement  Patient's Home Medications on Admission:  Current Outpatient Medications:  .  acetaminophen (TYLENOL) 500 MG tablet, Take 1,000 mg by mouth at bedtime as needed (joint pain)., Disp: , Rfl:  .  amLODipine (NORVASC) 2.5 MG tablet, TAKE 1 TABLET BY MOUTH  DAILY, Disp: 90 tablet, Rfl: 3 .  amphetamine-dextroamphetamine (ADDERALL) 5 MG tablet, Take 5 mg by mouth 2 (two) times daily., Disp: , Rfl:  .  aspirin EC 81 MG tablet, Take 81 mg by mouth daily. , Disp: , Rfl:  .  atorvastatin (LIPITOR) 20 MG tablet, Take 1 tablet (20 mg total) by mouth daily at 6 PM., Disp: 30 tablet, Rfl: 6 .  calcium citrate (CALCITRATE - DOSED IN MG ELEMENTAL CALCIUM) 950 (200 Ca) MG tablet, Take 200 mg of elemental calcium by mouth daily., Disp: , Rfl:  .  Carboxymethylcellul-Glycerin (LUBRICATING EYE DROPS OP), Place 1 drop into both eyes daily as needed (dry eyes)., Disp: , Rfl:  .  Cholecalciferol (VITAMIN D3) 50 MCG (2000 UT) capsule, Take 2,000 Units by mouth daily. , Disp: , Rfl:  .  clopidogrel (PLAVIX) 75 MG tablet, Take 1 tablet (75 mg total) by mouth daily., Disp: 90 tablet, Rfl: 1 .  Coenzyme Q10 (CO Q-10) 200 MG CAPS, Take 200 mg by mouth daily. , Disp: , Rfl:  .  Cyanocobalamin (VITAMIN B-12) 2500 MCG SUBL, Place 2,500 mcg under the tongue daily., Disp: , Rfl:  .  metoprolol succinate (TOPROL-XL) 25 MG 24 hr tablet, Take 25 mg by mouth daily., Disp: , Rfl:  .  pantoprazole (PROTONIX) 40 MG tablet, Take 1 tablet (40 mg  total) by mouth daily., Disp: 90 tablet, Rfl: 3  Past Medical History: Past Medical History:  Diagnosis Date  . ADHD (attention deficit hyperactivity disorder)   . B12 deficiency   . BPH (benign prostatic hypertrophy)   . 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 Coronary artery disease CARDIOLOGIST- DR Daneen Schick   S/P STENTING LAD 1999 // Myoview 01/2019: EF 62, normal perfusion; Low Risk  . Crohn's disease of ileum ([place]) SINCE 1988  . ED (erectile dysfunction)   . Elevated PSA   . Essential and other specified forms of tremor 11/25/2012  . H/O adenomatous polyp of colon   . Hyperlipidemia   . Nocturia   . OA (osteoarthritis)   . Peripheral neuropathy    hx of, none current as of 08-04-13  . Peyronie disease   . S/P coronary artery stent placement OCT 1999 OF LAD  . Tremor, hereditary, benign MILD RIGHT HAND    Tobacco Use: Social History   Tobacco Use  Smoking Status Former Smoker  . Packs/day: 1.00  . Years: 10.00  . Pack years: 10.00  . Types: Cigarettes  . Quit date: 07/09/1984  . Years since quitting: 34.7  Smokeless Tobacco Never Used    Labs: Recent Review  Flowsheet Data    Labs for ITP Cardiac and Pulmonary Rehab Latest Ref Rng & Units 05/13/2010 10/20/2013 10/22/2014 12/14/2015   Cholestrol 125 - 200 mg/dL - 134 138 154   LDLCALC <130 mg/dL - 70 74 83   HDL >=40 mg/dL - 45.30 42.50 47   Trlycerides <150 mg/dL - 93.0 111.0 118   TCO2 0 - 100 mmol/L 22 - - -      Capillary Blood Glucose: No results found for: GLUCAP   Exercise Target Goals: Exercise Program Goal: Individual exercise prescription set using results from initial 6 min walk test and THRR while considering  patient's activity barriers and safety.   Exercise Prescription Goal: Initial exercise prescription builds to 30-45 minutes a day of aerobic activity, 2-3 days per week.  Home exercise  guidelines will be given to patient during program as part of exercise prescription that the participant will acknowledge.  Activity Barriers & Risk Stratification: Activity Barriers & Cardiac Risk Stratification - 03/20/19 0942      Activity Barriers & Cardiac Risk Stratification   Activity Barriers  Arthritis;Right Knee Replacement;Balance Concerns    Cardiac Risk Stratification  Moderate       6 Minute Walk: 6 Minute Walk    Row Name 03/20/19 1004         6 Minute Walk   Phase  Initial     Distance  1614 feet     Walk Time  6 minutes     # of Rest Breaks  0     MPH  3.06     METS  3.15     RPE  12     Perceived Dyspnea   0     VO2 Peak  11.02     Symptoms  No     Resting HR  64 bpm     Resting BP  114/70     Resting Oxygen Saturation   100 %     Exercise Oxygen Saturation  during 6 min walk  100 %     Max Ex. HR  89 bpm     Max Ex. BP  142/82     2 Minute Post BP  128/82        Oxygen Initial Assessment:   Oxygen Re-Evaluation:   Oxygen Discharge (Final Oxygen Re-Evaluation):   Initial Exercise Prescription: Initial Exercise Prescription - 03/20/19 1100      Date of Initial Exercise RX and Referring Provider   Date  03/20/19    Referring Provider  Belva Crome, MD      Track   Minutes  60      Prescription Details   Frequency (times per week)  6-7    Duration  Progress to 50 minutes of aerobic without signs/symptoms of physical distress   Patient accumulates 60 -60.25 minutes of walking     Intensity   THRR 40-80% of Max Heartrate  58-117    Ratings of Perceived Exertion  11-13    Perceived Dyspnea  0-4      Progression   Progression  Continue to progress workloads to maintain intensity without signs/symptoms of physical distress.      Resistance Training   Training Prescription  Yes    Weight  3-5lbs    Reps  10-15       Perform Capillary Blood Glucose checks as needed.  Exercise Prescription Changes:   Exercise  Comments:  Exercise Comments    Row Name 03/20/19 [phone]  Exercise Comments  Reviewed home exercise guidelines with paitent.          Exercise Goals and Review:  Exercise Goals    Row Name 03/20/19 0942             Exercise Goals   Increase Physical Activity  Yes       Intervention  Provide advice, education, support and counseling about physical activity/exercise needs.;Develop an individualized exercise prescription for aerobic and resistive training based on initial evaluation findings, risk stratification, comorbidities and participant's personal goals.       Expected Outcomes  Short Term: Attend rehab on a regular basis to increase amount of physical activity.;Long Term: Exercising regularly at least 3-5 days a week.;Long Term: Add in home exercise to make exercise part of routine and to increase amount of physical activity.       Increase Strength and Stamina  Yes       Intervention  Provide advice, education, support and counseling about physical activity/exercise needs.;Develop an individualized exercise prescription for aerobic and resistive training based on initial evaluation findings, risk stratification, comorbidities and participant's personal goals.       Expected Outcomes  Short Term: Increase workloads from initial exercise prescription for resistance, speed, and METs.;Short Term: Perform resistance training exercises routinely during rehab and add in resistance training at home;Long Term: Improve cardiorespiratory fitness, muscular endurance and strength as measured by increased METs and functional capacity (6MWT)       Able to understand and use rate of perceived exertion (RPE) scale  Yes       Intervention  Provide education and explanation on how to use RPE scale       Expected Outcomes  Short Term: Able to use RPE daily in rehab to express subjective intensity level;Long Term:  Able to use RPE to guide intensity level when exercising independently        Knowledge and understanding of Target Heart Rate Range (THRR)  Yes       Intervention  Provide education and explanation of THRR including how the numbers were predicted and where they are located for reference       Expected Outcomes  Short Term: Able to state/look up THRR;Long Term: Able to use THRR to govern intensity when exercising independently;Short Term: Able to use daily as guideline for intensity in rehab       Able to check pulse independently  Yes       Intervention  Provide education and demonstration on how to check pulse in carotid and radial arteries.;Review the importance of being able to check your own pulse for safety during independent exercise       Expected Outcomes  Short Term: Able to explain why pulse checking is important during independent exercise;Long Term: Able to check pulse independently and accurately       Understanding of Exercise Prescription  Yes       Intervention  Provide education, explanation, and written materials on patient's individual exercise prescription       Expected Outcomes  Short Term: Able to explain program exercise prescription;Long Term: Able to explain home exercise prescription to exercise independently          Exercise Goals Re-Evaluation :   Discharge Exercise Prescription (Final Exercise Prescription Changes):   Nutrition:  Target Goals: Understanding of nutrition guidelines, daily intake of sodium <1536m, cholesterol <2036m calories 30% from fat and 7% or less from saturated fats, daily to have 5 or more servings of fruits  and vegetables.  Biometrics: Pre Biometrics - 03/20/19 1008      Pre Biometrics   Waist Circumference  42 inches    Hip Circumference  38 inches    Waist to Hip Ratio  1.11 %    Triceps Skinfold  10 mm    % Body Fat  27.4 %    Grip Strength  23.5 kg    Flexibility  12 in    Single Leg Stand  9.52 seconds        Nutrition Therapy Plan and Nutrition Goals:   Nutrition Assessments:   Nutrition  Goals Re-Evaluation:   Nutrition Goals Re-Evaluation:   Nutrition Goals Discharge (Final Nutrition Goals Re-Evaluation):   Psychosocial: Target Goals: Acknowledge presence or absence of significant depression and/or stress, maximize coping skills, provide positive support system. Participant is able to verbalize types and ability to use techniques and skills needed for reducing stress and depression.  Initial Review & Psychosocial Screening: Initial Psych Review & Screening - 03/20/19 0924      Initial Review   Current issues with  None Identified   patient does admit to feeling down occasionally secondary to covid restrictions and isolation     Family Dynamics   Good Support System?  Yes    Comments  Mr. Passero continurs to have a positive attitude and outlook. He continues to work out of his home which provides him with socialization virtually and via telephone. He has a supportive family and zooms with his grandchildren for dinner each Wednesday. PHQ2 score 0. No psychosocial interventions needed at this time.      Barriers   Psychosocial barriers to participate in program  There are no identifiable barriers or psychosocial needs.      Screening Interventions   Interventions  Encouraged to exercise       Quality of Life Scores: Quality of Life - 03/20/19 1100      Quality of Life   Select  Quality of Life      Quality of Life Scores   Health/Function Pre  21.96 %    Socioeconomic Pre  27.86 %    Psych/Spiritual Pre  18.79 %    Family Pre  26.8 %    GLOBAL Pre  23.31 %      Scores of 19 and below usually indicate a poorer quality of life in these areas.  A difference of  2-3 points is a clinically meaningful difference.  A difference of 2-3 points in the total score of the Quality of Life Index has been associated with significant improvement in overall quality of life, self-image, physical symptoms, and general health in studies assessing change in quality of  life.  PHQ-9: Recent Review Flowsheet Data    Depression screen [facility] 2/9 03/20/2019 11/13/2013   Decreased Interest 0 0   Down, Depressed, Hopeless 1 0   PHQ - 2 Score 1 0     Interpretation of Total Score  Total Score Depression Severity:  1-4 = Minimal depression, 5-9 = Mild depression, 10-14 = Moderate depression, 15-19 = Moderately severe depression, 20-27 = Severe depression   Psychosocial Evaluation and Intervention:   Psychosocial Re-Evaluation:   Psychosocial Discharge (Final Psychosocial Re-Evaluation):   Vocational Rehabilitation: Provide vocational rehab assistance to qualifying candidates.   Vocational Rehab Evaluation & Intervention: Vocational Rehab - 03/20/19 6503      Initial Vocational Rehab Evaluation & Intervention   Assessment shows need for Vocational Rehabilitation  No  Education: Education Goals: Education classes will be provided on a weekly basis, covering required topics. Participant will state understanding/return demonstration of topics presented.  Learning Barriers/Preferences:   Education Topics: Count Your Pulse:  -Group instruction provided by verbal instruction, demonstration, patient participation and written materials to support subject.  Instructors address importance of being able to find your pulse and how to count your pulse when at home without a heart monitor.  Patients get hands on experience counting their pulse with staff help and individually.   Heart Attack, Angina, and Risk Factor Modification:  -Group instruction provided by verbal instruction, video, and written materials to support subject.  Instructors address signs and symptoms of angina and heart attacks.    Also discuss risk factors for heart disease and how to make changes to improve heart health risk factors.   Functional Fitness:  -Group instruction provided by verbal instruction, demonstration, patient participation, and written materials to support subject.   Instructors address safety measures for doing things around the house.  Discuss how to get up and down off the floor, how to pick things up properly, how to safely get out of a chair without assistance, and balance training.   Meditation and Mindfulness:  -Group instruction provided by verbal instruction, patient participation, and written materials to support subject.  Instructor addresses importance of mindfulness and meditation practice to help reduce stress and improve awareness.  Instructor also leads participants through a meditation exercise.    Stretching for Flexibility and Mobility:  -Group instruction provided by verbal instruction, patient participation, and written materials to support subject.  Instructors lead participants through series of stretches that are designed to increase flexibility thus improving mobility.  These stretches are additional exercise for major muscle groups that are typically performed during regular warm up and cool down.   Hands Only CPR:  -Group verbal, video, and participation provides a basic overview of AHA guidelines for community CPR. Role-play of emergencies allow participants the opportunity to practice calling for help and chest compression technique with discussion of AED use.   Hypertension: -Group verbal and written instruction that provides a basic overview of hypertension including the most recent diagnostic guidelines, risk factor reduction with self-care instructions and medication management.    Nutrition I class: Heart Healthy Eating:  -Group instruction provided by PowerPoint slides, verbal discussion, and written materials to support subject matter. The instructor gives an explanation and review of the Therapeutic Lifestyle Changes diet recommendations, which includes a discussion on lipid goals, dietary fat, sodium, fiber, plant stanol/sterol esters, sugar, and the components of a well-balanced, healthy diet.   Nutrition II class:  Lifestyle Skills:  -Group instruction provided by PowerPoint slides, verbal discussion, and written materials to support subject matter. The instructor gives an explanation and review of label reading, grocery shopping for heart health, heart healthy recipe modifications, and ways to make healthier choices when eating out.   Diabetes Question & Answer:  -Group instruction provided by PowerPoint slides, verbal discussion, and written materials to support subject matter. The instructor gives an explanation and review of diabetes co-morbidities, pre- and post-prandial blood glucose goals, pre-exercise blood glucose goals, signs, symptoms, and treatment of hypoglycemia and hyperglycemia, and foot care basics.   Diabetes Blitz:  -Group instruction provided by PowerPoint slides, verbal discussion, and written materials to support subject matter. The instructor gives an explanation and review of the physiology behind type 1 and type 2 diabetes, diabetes medications and rational behind using different medications, pre- and post-prandial blood glucose  recommendations and Hemoglobin A1c goals, diabetes diet, and exercise including blood glucose guidelines for exercising safely.    Portion Distortion:  -Group instruction provided by PowerPoint slides, verbal discussion, written materials, and food models to support subject matter. The instructor gives an explanation of serving size versus portion size, changes in portions sizes over the last 20 years, and what consists of a serving from each food group.   Stress Management:  -Group instruction provided by verbal instruction, video, and written materials to support subject matter.  Instructors review role of stress in heart disease and how to cope with stress positively.     Exercising on Your Own:  -Group instruction provided by verbal instruction, power point, and written materials to support subject.  Instructors discuss benefits of exercise, components  of exercise, frequency and intensity of exercise, and end points for exercise.  Also discuss use of nitroglycerin and activating EMS.  Review options of places to exercise outside of rehab.  Review guidelines for sex with heart disease.   Cardiac Drugs I:  -Group instruction provided by verbal instruction and written materials to support subject.  Instructor reviews cardiac drug classes: antiplatelets, anticoagulants, beta blockers, and statins.  Instructor discusses reasons, side effects, and lifestyle considerations for each drug class.   Cardiac Drugs II:  -Group instruction provided by verbal instruction and written materials to support subject.  Instructor reviews cardiac drug classes: angiotensin converting enzyme inhibitors (ACE-I), angiotensin II receptor blockers (ARBs), nitrates, and calcium channel blockers.  Instructor discusses reasons, side effects, and lifestyle considerations for each drug class.   Anatomy and Physiology of the Circulatory System:  Group verbal and written instruction and models provide basic cardiac anatomy and physiology, with the coronary electrical and arterial systems. Review of: AMI, Angina, Valve disease, Heart Failure, Peripheral Artery Disease, Cardiac Arrhythmia, Pacemakers, and the ICD.   Other Education:  -Group or individual verbal, written, or video instructions that support the educational goals of the cardiac rehab program.   Holiday Eating Survival Tips:  -Group instruction provided by PowerPoint slides, verbal discussion, and written materials to support subject matter. The instructor gives patients tips, tricks, and techniques to help them not only survive but enjoy the holidays despite the onslaught of food that accompanies the holidays.   Knowledge Questionnaire Score: Knowledge Questionnaire Score - 03/20/19 1100      Knowledge Questionnaire Score   Pre Score  19/24       Core Components/Risk Factors/Patient Goals at  Admission: Personal Goals and Risk Factors at Admission - 03/20/19 1100      Core Components/Risk Factors/Patient Goals on Admission   Hypertension  Yes    Intervention  Provide education on lifestyle modifcations including regular physical activity/exercise, weight management, moderate sodium restriction and increased consumption of fresh fruit, vegetables, and low fat dairy, alcohol moderation, and smoking cessation.;Monitor prescription use compliance.    Expected Outcomes  Short Term: Continued assessment and intervention until BP is < 140/27m HG in hypertensive participants. < 130/860mHG in hypertensive participants with diabetes, heart failure or chronic kidney disease.;Long Term: Maintenance of blood pressure at goal levels.    Lipids  Yes    Intervention  Provide education and support for participant on nutrition & aerobic/resistive exercise along with prescribed medications to achieve LDL <7067mHDL >79m67m  Expected Outcomes  Short Term: Participant states understanding of desired cholesterol values and is compliant with medications prescribed. Participant is following exercise prescription and nutrition guidelines.;Long Term: Cholesterol controlled with medications as  prescribed, with individualized exercise RX and with personalized nutrition plan. Value goals: LDL < 31m, HDL > 40 mg.       Core Components/Risk Factors/Patient Goals Review:    Core Components/Risk Factors/Patient Goals at Discharge (Final Review):    ITP Comments: ITP Comments    Row Name 03/20/19 0920           ITP Comments  Dr. TFransico Him Medical Director [facility]          Comments: Patient attended orientation on 03/21/2019 to review rules and guidelines for program.  Completed 6 minute walk test, Intitial ITP, and exercise prescription.  VSS. Telemetry-NSR.  Asymptomatic. Safety measures and social distancing in place per CDC guidelines. Patient successfully documented his VS in the  [facility] App and tutorial provided. Will follow up next week.

## 2019-03-27 ENCOUNTER — Encounter (HOSPITAL_COMMUNITY): Payer: Medicare Other

## 2019-04-01 ENCOUNTER — Other Ambulatory Visit: Payer: Self-pay

## 2019-04-01 ENCOUNTER — Telehealth (HOSPITAL_COMMUNITY): Payer: Self-pay | Admitting: *Deleted

## 2019-04-01 ENCOUNTER — Telehealth (HOSPITAL_COMMUNITY): Payer: Self-pay | Admitting: Internal Medicine

## 2019-04-01 ENCOUNTER — Encounter (HOSPITAL_COMMUNITY)
Admission: RE | Admit: 2019-04-01 | Discharge: 2019-04-01 | Disposition: A | Discharge status: Home / Self Care | Payer: Self-pay | Source: Ambulatory Visit | Attending: Interventional Cardiology | Admitting: Interventional Cardiology

## 2019-04-01 NOTE — Telephone Encounter (Signed)
Spoke with Cole Martinez he is doing well with exercising and documenting his exercise on the virtual cardiac rehab [facility] APP. Cole Martinez is interested in exercising at Cardiac rehab for phase 2 in person rehab. Cole Martinez has lost 2 pounds. Patient congratulated on his progress! Cole Martinez is exercising 3-6 days a week by walking his dog and walking.Barnet Pall, RN,BSN 04/01/2019 1:36 PM

## 2019-04-07 ENCOUNTER — Ambulatory Visit: Payer: Medicare Other | Attending: Internal Medicine

## 2019-04-07 DIAGNOSIS — Z23 Encounter for immunization: Secondary | ICD-10-CM | POA: Insufficient documentation

## 2019-04-07 NOTE — Progress Notes (Signed)
   Covid-19 Vaccination Clinic  Name:  Cole Martinez    MRN: [id number] DOB: 12-13-1945  04/07/2019  [name] was observed post Covid-19 immunization for 15 minutes without incidence. He was provided with Vaccine Information Sheet and instruction to access the V-Safe system.   [name] was instructed to call 911 with any severe reactions post vaccine: Marland Kitchen Difficulty breathing  . Swelling of your face and throat  . A fast heartbeat  . A bad rash all over your body  . Dizziness and weakness    Immunizations Administered    Name Date Dose VIS Date Route   Pfizer COVID-19 Vaccine 04/07/2019  9:54 AM 0.3 mL 02/14/2019 Intramuscular   Manufacturer: [place]   Lot: [id number]   [place]: [id number]

## 2019-04-08 ENCOUNTER — Other Ambulatory Visit: Payer: Self-pay | Admitting: Interventional Cardiology

## 2019-04-08 MED ORDER — METOPROLOL SUCCINATE ER 25 MG PO TB24: 25.0000 mg | ORAL_TABLET | Freq: Every day | ORAL | 3 refills | Status: DC

## 2019-04-08 NOTE — Telephone Encounter (Signed)
Pt's medication was sent to pt's pharmacy as requested. Confirmation received.  °

## 2019-04-14 ENCOUNTER — Other Ambulatory Visit: Payer: Self-pay | Admitting: Interventional Cardiology

## 2019-04-14 NOTE — Addendum Note (Signed)
Encounter addended by: Sol Passer on: 04/14/2019 12:23 PM  Actions taken: Flowsheet data copied forward, Flowsheet accepted

## 2019-04-15 ENCOUNTER — Encounter (HOSPITAL_COMMUNITY): Payer: Self-pay | Admitting: *Deleted

## 2019-04-15 ENCOUNTER — Telehealth (HOSPITAL_COMMUNITY): Payer: Self-pay

## 2019-04-15 DIAGNOSIS — Z955 Presence of coronary angioplasty implant and graft: Secondary | ICD-10-CM

## 2019-04-15 NOTE — Progress Notes (Signed)
PATIENT: Cole Martinez DOB: 1945/10/11  REASON FOR VISIT: follow up HISTORY FROM: patient  HISTORY OF PRESENT ILLNESS: Today 04/16/19  Cole Martinez is a 74 year old male with history of essential tremor.  He has an action and resting component of both upper extremities.  He feels that over the years, his tremor slightly worsens.  The tremor worsens when he exercises or is upset.  He is on Adderall low-dose.  He does not wish to be on medications for tremor.  He had a cardiac stent placed in December 2020.  At times he may have a head and neck component to the tremor.  At times, he may have difficulty with handwriting.  He says he does well with eating, occasionally he may drop food off his fork.  He had 1 fall last summer while carrying something upstairs, fell backwards.  He does feel his balance is sometimes off.  He walks every day.  He feels he is forgetful with his memory.  He may misplace things.  He will be starting cardiac rehab next week.  He has had a tremor since at least 2007, he reports his brother had Parkinson's disease, and that he also has family history of essential tremor.  He presents today for evaluation unaccompanied.  HISTORY 04/15/2018 Dr. Jannifer Martinez: Cole Martinez is a 74 year old right-handed white male with a history of an essential tremor.  The patient has an action and resting components of both upper extremities, he has had the tremor at least since 2007.  There is a head and neck component to the tremor at times, the tremors worsen when he exercises or is upset about something.  He does use Adderall in low-dose.  He is continuing to work.  He is able to operate a mouse with the left hand, but he has difficulty holding a plate or another object in the left hand because of the tremor.  His handwriting has become somewhat sloppy and small.  He denies any speech changes or swallowing changes, he does note some mild gait instability, he reports no recent falls.  He drinks 2 cups of  coffee a day and sometimes he will drink tea as well.  He returns for an evaluation.  He has not wanted medications for tremor   REVIEW OF SYSTEMS: Out of a complete 14 system review of symptoms, the patient complains only of the following symptoms, and all other reviewed systems are negative.  Tremor  ALLERGIES: No Known Allergies  HOME MEDICATIONS: Outpatient Medications Prior to Visit  Medication Sig Dispense Refill  . acetaminophen (TYLENOL) 500 MG tablet Take 1,000 mg by mouth at bedtime as needed (joint pain).    Marland Kitchen amLODipine (NORVASC) 2.5 MG tablet TAKE 1 TABLET BY MOUTH  DAILY 90 tablet 3  . amphetamine-dextroamphetamine (ADDERALL) 5 MG tablet Take 5 mg by mouth 2 (two) times daily.    Marland Kitchen aspirin EC 81 MG tablet Take 81 mg by mouth daily.     Marland Kitchen atorvastatin (LIPITOR) 20 MG tablet TAKE 1 TABLET BY MOUTH IN  THE MORNING 90 tablet 3  . calcium citrate (CALCITRATE - DOSED IN MG ELEMENTAL CALCIUM) 950 (200 Ca) MG tablet Take 200 mg of elemental calcium by mouth daily.    . Carboxymethylcellul-Glycerin (LUBRICATING EYE DROPS OP) Place 1 drop into both eyes daily as needed (dry eyes).    . Cholecalciferol (VITAMIN D3) 50 MCG (2000 UT) capsule Take 2,000 Units by mouth daily.     . clopidogrel (PLAVIX) 75  MG tablet Take 1 tablet (75 mg total) by mouth daily. 90 tablet 1  . Coenzyme Q10 (CO Q-10) 200 MG CAPS Take 200 mg by mouth daily.     . Cyanocobalamin (VITAMIN B-12) 2500 MCG SUBL Place 2,500 mcg under the tongue daily.    . metoprolol succinate (TOPROL-XL) 25 MG 24 hr tablet Take 1 tablet (25 mg total) by mouth daily. 90 tablet 3  . pantoprazole (PROTONIX) 40 MG tablet Take 1 tablet (40 mg total) by mouth daily. 90 tablet 3   No facility-administered medications prior to visit.    PAST MEDICAL HISTORY: Past Medical History:  Diagnosis Date  . ADHD (attention deficit hyperactivity disorder)   . B12 deficiency   . BPH (benign prostatic hypertrophy)   . 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 Coronary artery disease CARDIOLOGIST- DR Daneen Schick   S/P STENTING LAD 1999 // Myoview 01/2019: EF 62, normal perfusion; Low Risk  . Crohn's disease of ileum ([place]) SINCE 1988  . ED (erectile dysfunction)   . Elevated PSA   . Essential and other specified forms of tremor 11/25/2012  . H/O adenomatous polyp of colon   . Hyperlipidemia   . Nocturia   . OA (osteoarthritis)   . Peripheral neuropathy    hx of, none current as of 08-04-13  . Peyronie disease   . S/P coronary artery stent placement OCT 1999 OF LAD  . Tremor, hereditary, benign MILD RIGHT HAND    PAST SURGICAL HISTORY: Past Surgical History:  Procedure Laterality Date  . CARDIOVASCULAR STRESS TEST  11-01-2010 DR Daneen Schick   NORMAL PERFUSION STUDY/ EF 64%/ NO ISCHEMIA  . cataract surgery  Bilateral feburary 2020   with lens placement   . COLONOSCOPY WITH PROPOFOL N/A 09/24/2012   Procedure: COLONOSCOPY WITH PROPOFOL;  Surgeon: [name], MD;  Location: [facility];  Service: Endoscopy;  Laterality: N/A;  . CORONARY ANGIOPLASTY WITH STENT PLACEMENT  OCT 1999   STENT OF LAD  . CORONARY STENT INTERVENTION N/A 02/14/2019   Procedure: CORONARY STENT INTERVENTION;  Surgeon: Belva Crome, MD;  Location: [facility];  Service: Cardiovascular;  Laterality: N/A;  . CYSTOSCOPY WITH URETHRAL DILATATION  03/12/2017   Procedure: CYSTOSCOPY WITH URETHRAL DILATATION;  Surgeon: Gaynelle Arabian, MD;  Location: [facility];  Service: Orthopedics;;  Ammie Dalton, Resident Assisting  . CYSTOSCOPY WITH URETHRAL DILATATION N/A 10/03/2018   Procedure: CYSTOSCOPY WITH URETHRAL BALLOON DILATATION WITH BILATERAL RETROGRADE PYELOGRAPHY;  Surgeon: Raynelle Bring, MD;  Location: [facility];  Service: Urology;  Laterality: N/A;  . LAPAROSCOPIC INGUINAL HERNIA REPAIR Bilateral 01-10-2004   W/ MESH  . LEFT HEART CATH AND CORONARY  ANGIOGRAPHY N/A 02/13/2019   Procedure: LEFT HEART CATH AND CORONARY ANGIOGRAPHY;  Surgeon: Belva Crome, MD;  Location: [facility];  Service: Cardiovascular;  Laterality: N/A;  . neck benign removed from neck  yrs ago  . PROSTATE BIOPSY N/A 07/12/2012   Procedure: PROSTATE BIOPSY AND ULTRASOUND;  Surgeon: Ailene Rud, MD;  Location: [facility];  Service: Urology;  Laterality: N/A;  . PROSTATE SURGERY  2002   tuna  . REMOVAL LEFT NECK LYMPH NODE  1996  . TONSILLECTOMY  CHILD  . TOTAL KNEE ARTHROPLASTY Right 03/12/2017   Procedure: RIGHT TOTAL KNEE ARTHROPLASTY;  Surgeon: Gaynelle Arabian, MD;  Location: [facility];  Service: Orthopedics;  Laterality:  Right;  Adductor Block  . TRANSURETHRAL RESECTION OF PROSTATE N/A 08/08/2013   Procedure: TRANSURETHRAL RESECTION OF THE PROSTATE WITH GYRUS INSTRUMENTS;  Surgeon: Ailene Rud, MD;  Location: [facility];  Service: Urology;  Laterality: N/A;  . TRANSURETHRAL RESECTION OF PROSTATE      FAMILY HISTORY: Family History  Problem Relation Age of Onset  . Obesity Brother   . Diabetes Brother   . Parkinsonism Brother     SOCIAL HISTORY: Social History   Socioeconomic History  . Marital status: Married    Spouse name: Not on file  . Number of children: 3  . Years of education: 16  . Highest education level: Bachelor's degree (e.g., BA, AB, BS)  Occupational History  . Not on file  Tobacco Use  . Smoking status: Former Smoker    Packs/day: 1.00    Years: 10.00    Pack years: 10.00    Types: Cigarettes    Quit date: 07/09/1984    Years since quitting: 34.7  . Smokeless tobacco: Never Used  Substance and Sexual Activity  . Alcohol use: Yes    Alcohol/week: 2.0 standard drinks    Types: 2 Standard drinks or equivalent per week    Comment: daily 1 per day ,   . Drug use: No  . Sexual activity: Not on file  Other Topics Concern  . Not on file  Social History Narrative   Lives at home with is wife, [name].   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  Has BBA.  Has 3 children.    Caffeine 2 cups coffee.     Social Determinants of Health   Financial Resource Strain:   . Difficulty of Paying Living Expenses: Not on file  Food Insecurity: No Food Insecurity  . Worried About Charity fundraiser in the Last Year: Never true  . Ran Out of Food in the Last Year: Never true  Transportation Needs: No Transportation Needs  . Lack of Transportation (Medical): No  . Lack of Transportation (Non-Medical): No  Physical Activity: Insufficiently Active  . Days of Exercise per Week: 3 days  . Minutes of Exercise per Session: 40 min  Stress:   . Feeling of Stress : Not on file  Social Connections: Unknown  . Frequency of Communication with Friends and Family: More than three times a week  . Frequency of Social Gatherings with Friends and Family: Once a week  . Attends Religious Services: Not on file  . Active Member of Clubs or Organizations: Not on file  . Attends Archivist Meetings: Not on file  . Marital Status: Married  Human resources officer Violence:   . Fear of Current or Ex-Partner: Not on file  . Emotionally Abused: Not on file  . Physically Abused: Not on file  . Sexually Abused: Not on file   PHYSICAL EXAM  Vitals:   04/16/19 0845  BP: (!) 141/79  Pulse: 64  Temp: 97.6 F (36.4 C)  Weight: 180 lb (81.6 kg)  Height: 5' 7"  (1.702 m)   Body mass index is 28.19 kg/m.  Generalized: Well developed, in no acute distress   Neurological examination  Mentation: Alert oriented to time, place, history taking. Follows all commands speech and language fluent.  No masking of the face was seen. Cranial nerve II-XII: Pupils were equal round reactive to light. Extraocular movements were full, visual field were full on confrontational test. Facial sensation and strength were normal. Head turning and shoulder shrug  were normal and  symmetric.  Motor: The motor testing reveals 5 over 5 strength of all 4  extremities. Good symmetric motor tone is noted throughout.  Sensory: Sensory testing is intact to soft touch on all 4 extremities. No evidence of extinction is noted.  Coordination: Cerebellar testing reveals good finger-nose-finger and heel-to-shin bilaterally.  He has minimal tremor with finger-nose-finger bilaterally, resting tremor was noted bilaterally (left was greater) Gait and station: Gait is normal, symmetric arm swing, good turns.  Tandem gait is mildly unsteady. Romberg is negative. No drift is seen.  Reflexes: Deep tendon reflexes are symmetric    DIAGNOSTIC DATA (LABS, IMAGING, TESTING) - I reviewed patient records, labs, notes, testing and imaging myself where available.  Lab Results  Component Value Date   WBC 9.1 02/07/2019   HGB 15.0 02/07/2019   HCT 45.0 02/07/2019   MCV 89 02/07/2019   PLT 243 02/07/2019      Component Value Date/Time   NA 140 03/03/2019 0913   K 4.4 03/03/2019 0913   CL 104 03/03/2019 0913   CO2 23 03/03/2019 0913   GLUCOSE 111 (H) 03/03/2019 0913   GLUCOSE 109 (H) 02/14/2019 0725   BUN 22 03/03/2019 0913   CREATININE 1.27 03/03/2019 0913   CALCIUM 9.0 03/03/2019 0913   PROT 7.3 03/20/2017 1539   ALBUMIN 3.9 03/20/2017 1539   AST 28 03/20/2017 1539   ALT 29 03/20/2017 1539   ALKPHOS 75 03/20/2017 1539   BILITOT 1.7 (H) 03/20/2017 1539   GFRNONAA 55 (L) 03/03/2019 0913   GFRAA 64 03/03/2019 0913   Lab Results  Component Value Date   CHOL 154 12/14/2015   HDL 47 12/14/2015   LDLCALC 83 12/14/2015   TRIG 118 12/14/2015   CHOLHDL 3.3 12/14/2015   No results found for: HGBA1C No results found for: VITAMINB12 No results found for: TSH    ASSESSMENT AND PLAN 74 y.o. year old male  has a past medical history of ADHD (attention deficit hyperactivity disorder), B12 deficiency, BPH (benign prostatic hypertrophy), Chronic lymphocytic leukemia (CLL), T-cell ([place]) (DX 1996--  ONCOLOGIST-  DR Benay Spice), Coronary artery disease (CARDIOLOGIST-  DR Daneen Schick), Crohn's disease of ileum ([place]) ([place]), ED (erectile dysfunction), Elevated PSA, Essential and other specified forms of tremor (11/25/2012), H/O adenomatous polyp of colon, Hyperlipidemia, Nocturia, OA (osteoarthritis), Peripheral neuropathy, Peyronie disease, S/P coronary artery stent placement (OCT 1999 OF LAD), and Tremor, hereditary, benign (MILD RIGHT HAND). here with:  1.  History of tremor, likely essential tremor  Overall, his tremors seem stable. He does not wish to be on medications for the tremor.  I do not see any signs of parkinson's disease on exam.  He will follow-up in 1 year or sooner if needed.   I spent 15 minutes with the patient. 50% of this time was spent discussing his plan of care.   Butler Denmark, AGNP-C, DNP 04/16/2019, 9:10 AM [facility] [address] [place], [place] [address] [phone]

## 2019-04-15 NOTE — Telephone Encounter (Signed)
Called patient to see if he was interested in participating in the Cardiac Rehab Program. Patient stated yes. Patient will attend the 10:45am exercise class.

## 2019-04-15 NOTE — Progress Notes (Signed)
Cardiac Individual Treatment Plan  Patient Details  Name: Cole Martinez MRN: [id number] Date of Birth: 10/31/1945 Referring Provider:     CARDIAC REHAB PHASE II ORIENTATION from 03/20/2019 in [place]  Referring Provider  Belva Crome, MD      Initial Encounter Date:    CARDIAC REHAB PHASE II ORIENTATION from 03/20/2019 in [place]  Date  03/20/19      Visit Diagnosis: Status post coronary artery stent placement  Patient's Home Medications on Admission:  Current Outpatient Medications:  .  acetaminophen (TYLENOL) 500 MG tablet, Take 1,000 mg by mouth at bedtime as needed (joint pain)., Disp: , Rfl:  .  amLODipine (NORVASC) 2.5 MG tablet, TAKE 1 TABLET BY MOUTH  DAILY, Disp: 90 tablet, Rfl: 3 .  amphetamine-dextroamphetamine (ADDERALL) 5 MG tablet, Take 5 mg by mouth 2 (two) times daily., Disp: , Rfl:  .  aspirin EC 81 MG tablet, Take 81 mg by mouth daily. , Disp: , Rfl:  .  atorvastatin (LIPITOR) 20 MG tablet, TAKE 1 TABLET BY MOUTH IN  THE MORNING, Disp: 90 tablet, Rfl: 3 .  calcium citrate (CALCITRATE - DOSED IN MG ELEMENTAL CALCIUM) 950 (200 Ca) MG tablet, Take 200 mg of elemental calcium by mouth daily., Disp: , Rfl:  .  Carboxymethylcellul-Glycerin (LUBRICATING EYE DROPS OP), Place 1 drop into both eyes daily as needed (dry eyes)., Disp: , Rfl:  .  Cholecalciferol (VITAMIN D3) 50 MCG (2000 UT) capsule, Take 2,000 Units by mouth daily. , Disp: , Rfl:  .  clopidogrel (PLAVIX) 75 MG tablet, Take 1 tablet (75 mg total) by mouth daily., Disp: 90 tablet, Rfl: 1 .  Coenzyme Q10 (CO Q-10) 200 MG CAPS, Take 200 mg by mouth daily. , Disp: , Rfl:  .  Cyanocobalamin (VITAMIN B-12) 2500 MCG SUBL, Place 2,500 mcg under the tongue daily., Disp: , Rfl:  .  metoprolol succinate (TOPROL-XL) 25 MG 24 hr tablet, Take 1 tablet (25 mg total) by mouth daily., Disp: 90 tablet, Rfl: 3 .  pantoprazole (PROTONIX) 40 MG tablet, Take 1  tablet (40 mg total) by mouth daily., Disp: 90 tablet, Rfl: 3  Past Medical History: Past Medical History:  Diagnosis Date  . ADHD (attention deficit hyperactivity disorder)   . B12 deficiency   . BPH (benign prostatic hypertrophy)   . 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 Coronary artery disease CARDIOLOGIST- DR Daneen Schick   S/P STENTING LAD 1999 // Myoview 01/2019: EF 62, normal perfusion; Low Risk  . Crohn's disease of ileum ([place]) SINCE 1988  . ED (erectile dysfunction)   . Elevated PSA   . Essential and other specified forms of tremor 11/25/2012  . H/O adenomatous polyp of colon   . Hyperlipidemia   . Nocturia   . OA (osteoarthritis)   . Peripheral neuropathy    hx of, none current as of 08-04-13  . Peyronie disease   . S/P coronary artery stent placement OCT 1999 OF LAD  . Tremor, hereditary, benign MILD RIGHT HAND    Tobacco Use: Social History   Tobacco Use  Smoking Status Former Smoker  . Packs/day: 1.00  . Years: 10.00  . Pack years: 10.00  . Types: Cigarettes  . Quit date: 07/09/1984  . Years since quitting: 34.7  Smokeless Tobacco Never Used    Labs: Recent  Review Flowsheet Data    Labs for ITP Cardiac and Pulmonary Rehab Latest Ref Rng & Units 05/13/2010 10/20/2013 10/22/2014 12/14/2015   Cholestrol 125 - 200 mg/dL - 134 138 154   LDLCALC <130 mg/dL - 70 74 83   HDL >=40 mg/dL - 45.30 42.50 47   Trlycerides <150 mg/dL - 93.0 111.0 118   TCO2 0 - 100 mmol/L 22 - - -      Capillary Blood Glucose: No results found for: GLUCAP   Exercise Target Goals: Exercise Program Goal: Individual exercise prescription set using results from initial 6 min walk test and THRR while considering  patient's activity barriers and safety.   Exercise Prescription Goal: Starting with aerobic activity 30 plus minutes a day, 3 days per week for initial exercise  prescription. Provide home exercise prescription and guidelines that participant acknowledges understanding prior to discharge.  Activity Barriers & Risk Stratification: Activity Barriers & Cardiac Risk Stratification - 03/20/19 0942      Activity Barriers & Cardiac Risk Stratification   Activity Barriers  Arthritis;Right Knee Replacement;Balance Concerns    Cardiac Risk Stratification  Moderate       6 Minute Walk: 6 Minute Walk    Row Name 03/20/19 1004         6 Minute Walk   Phase  Initial     Distance  1614 feet     Walk Time  6 minutes     # of Rest Breaks  0     MPH  3.06     METS  3.15     RPE  12     Perceived Dyspnea   0     VO2 Peak  11.02     Symptoms  No     Resting HR  64 bpm     Resting BP  114/70     Resting Oxygen Saturation   100 %     Exercise Oxygen Saturation  during 6 min walk  100 %     Max Ex. HR  89 bpm     Max Ex. BP  142/82     2 Minute Post BP  128/82        Oxygen Initial Assessment:   Oxygen Re-Evaluation:   Oxygen Discharge (Final Oxygen Re-Evaluation):   Initial Exercise Prescription: Initial Exercise Prescription - 03/20/19 1100      Date of Initial Exercise RX and Referring Provider   Date  03/20/19    Referring Provider  Belva Crome, MD      Track   Minutes  60      Prescription Details   Frequency (times per week)  6-7    Duration  Progress to 50 minutes of aerobic without signs/symptoms of physical distress   Patient accumulates 60 -60.25 minutes of walking     Intensity   THRR 40-80% of Max Heartrate  58-117    Ratings of Perceived Exertion  11-13    Perceived Dyspnea  0-4      Progression   Progression  Continue to progress workloads to maintain intensity without signs/symptoms of physical distress.      Resistance Training   Training Prescription  Yes    Weight  3-5lbs    Reps  10-15       Perform Capillary Blood Glucose checks as needed.  Exercise Prescription Changes: Exercise Prescription  Changes    Row Name 04/13/19 1218  Response to Exercise   Heart Rate (Admit)  76 bpm       Heart Rate (Exercise)  83 bpm       Rating of Perceived Exertion (Exercise)  11       Symptoms  none       Comments  Virtual cardiac rehab session       Duration  Continue with 45 min of aerobic exercise without signs/symptoms of physical distress.       Intensity  THRR unchanged         Progression   Progression  Continue to progress workloads to maintain intensity without signs/symptoms of physical distress.         Track   Minutes  48 2.76 mi, 6444 steps         Home Exercise Plan   Plans to continue exercise at  - walking       Initial Home Exercises Provided  03/20/19          Exercise Comments: Exercise Comments    Row Name 03/20/19 0940 04/14/19 1223         Exercise Comments  Reviewed home exercise guidelines with paitent.  Patient will be contacted to begin participation in the onsite cardiac rehab program after temporary closure due to COVID-19 pandemic.         Exercise Goals and Review: Exercise Goals    Row Name 03/20/19 0942             Exercise Goals   Increase Physical Activity  Yes       Intervention  Provide advice, education, support and counseling about physical activity/exercise needs.;Develop an individualized exercise prescription for aerobic and resistive training based on initial evaluation findings, risk stratification, comorbidities and participant's personal goals.       Expected Outcomes  Short Term: Attend rehab on a regular basis to increase amount of physical activity.;Long Term: Exercising regularly at least 3-5 days a week.;Long Term: Add in home exercise to make exercise part of routine and to increase amount of physical activity.       Increase Strength and Stamina  Yes       Intervention  Provide advice, education, support and counseling about physical activity/exercise needs.;Develop an individualized exercise prescription for  aerobic and resistive training based on initial evaluation findings, risk stratification, comorbidities and participant's personal goals.       Expected Outcomes  Short Term: Increase workloads from initial exercise prescription for resistance, speed, and METs.;Short Term: Perform resistance training exercises routinely during rehab and add in resistance training at home;Long Term: Improve cardiorespiratory fitness, muscular endurance and strength as measured by increased METs and functional capacity (6MWT)       Able to understand and use rate of perceived exertion (RPE) scale  Yes       Intervention  Provide education and explanation on how to use RPE scale       Expected Outcomes  Short Term: Able to use RPE daily in rehab to express subjective intensity level;Long Term:  Able to use RPE to guide intensity level when exercising independently       Knowledge and understanding of Target Heart Rate Range (THRR)  Yes       Intervention  Provide education and explanation of THRR including how the numbers were predicted and where they are located for reference       Expected Outcomes  Short Term: Able to state/look up THRR;Long Term: Able to use THRR to govern  intensity when exercising independently;Short Term: Able to use daily as guideline for intensity in rehab       Able to check pulse independently  Yes       Intervention  Provide education and demonstration on how to check pulse in carotid and radial arteries.;Review the importance of being able to check your own pulse for safety during independent exercise       Expected Outcomes  Short Term: Able to explain why pulse checking is important during independent exercise;Long Term: Able to check pulse independently and accurately       Understanding of Exercise Prescription  Yes       Intervention  Provide education, explanation, and written materials on patient's individual exercise prescription       Expected Outcomes  Short Term: Able to explain  program exercise prescription;Long Term: Able to explain home exercise prescription to exercise independently          Exercise Goals Re-Evaluation : Exercise Goals Re-Evaluation    Row Name 04/14/19 1221             Exercise Goal Re-Evaluation   Comments  Patient has been actively participating in the virtual cardiac rehab program via the [facility] app and has been progressing well. Paitent is walking 35-60 minutes, 3-6 days/week without issue. Patient is eager to begin the onsite cardiac rehab program.       Expected Outcomes  Patient will transition to the onsite cardiac rehab program.           Discharge Exercise Prescription (Final Exercise Prescription Changes): Exercise Prescription Changes - 04/13/19 1218      Response to Exercise   Heart Rate (Admit)  76 bpm    Heart Rate (Exercise)  83 bpm    Rating of Perceived Exertion (Exercise)  11    Symptoms  none    Comments  Virtual cardiac rehab session    Duration  Continue with 45 min of aerobic exercise without signs/symptoms of physical distress.    Intensity  THRR unchanged      Progression   Progression  Continue to progress workloads to maintain intensity without signs/symptoms of physical distress.      Track   Minutes  48   2.76 mi, 6444 steps     Home Exercise Plan   Plans to continue exercise at  --   walking   Initial Home Exercises Provided  03/20/19       Nutrition:  Target Goals: Understanding of nutrition guidelines, daily intake of sodium <1596m, cholesterol <2046m calories 30% from fat and 7% or less from saturated fats, daily to have 5 or more servings of fruits and vegetables.  Biometrics: Pre Biometrics - 03/20/19 1008      Pre Biometrics   Waist Circumference  42 inches    Hip Circumference  38 inches    Waist to Hip Ratio  1.11 %    Triceps Skinfold  10 mm    % Body Fat  27.4 %    Grip Strength  23.5 kg    Flexibility  12 in    Single Leg Stand  9.52 seconds         Nutrition Therapy Plan and Nutrition Goals:   Nutrition Assessments:   Nutrition Goals Re-Evaluation:   Nutrition Goals Discharge (Final Nutrition Goals Re-Evaluation):   Psychosocial: Target Goals: Acknowledge presence or absence of significant depression and/or stress, maximize coping skills, provide positive support system. Participant is able to verbalize types and  ability to use techniques and skills needed for reducing stress and depression.  Initial Review & Psychosocial Screening: Initial Psych Review & Screening - 03/20/19 0924      Initial Review   Current issues with  None Identified   patient does admit to feeling down occasionally secondary to covid restrictions and isolation     Family Dynamics   Good Support System?  Yes    Comments  Mr. Orourke continurs to have a positive attitude and outlook. He continues to work out of his home which provides him with socialization virtually and via telephone. He has a supportive family and zooms with his grandchildren for dinner each Wednesday. PHQ2 score 0. No psychosocial interventions needed at this time.      Barriers   Psychosocial barriers to participate in program  There are no identifiable barriers or psychosocial needs.      Screening Interventions   Interventions  Encouraged to exercise       Quality of Life Scores: Quality of Life - 03/20/19 1100      Quality of Life   Select  Quality of Life      Quality of Life Scores   Health/Function Pre  21.96 %    Socioeconomic Pre  27.86 %    Psych/Spiritual Pre  18.79 %    Family Pre  26.8 %    GLOBAL Pre  23.31 %      Scores of 19 and below usually indicate a poorer quality of life in these areas.  A difference of  2-3 points is a clinically meaningful difference.  A difference of 2-3 points in the total score of the Quality of Life Index has been associated with significant improvement in overall quality of life, self-image, physical symptoms, and general  health in studies assessing change in quality of life.  PHQ-9: Recent Review Flowsheet Data    Depression screen [facility] 2/9 03/20/2019 11/13/2013   Decreased Interest 0 0   Down, Depressed, Hopeless 1 0   PHQ - 2 Score 1 0     Interpretation of Total Score  Total Score Depression Severity:  1-4 = Minimal depression, 5-9 = Mild depression, 10-14 = Moderate depression, 15-19 = Moderately severe depression, 20-27 = Severe depression   Psychosocial Evaluation and Intervention:   Psychosocial Re-Evaluation: Psychosocial Re-Evaluation    Mar-Mac Name 04/15/19 0920             Psychosocial Re-Evaluation   Current issues with  None Identified       Interventions  Encouraged to attend Cardiac Rehabilitation for the exercise       Continue Psychosocial Services   No Follow up required          Psychosocial Discharge (Final Psychosocial Re-Evaluation): Psychosocial Re-Evaluation - 04/15/19 0920      Psychosocial Re-Evaluation   Current issues with  None Identified    Interventions  Encouraged to attend Cardiac Rehabilitation for the exercise    Continue Psychosocial Services   No Follow up required       Vocational Rehabilitation: Provide vocational rehab assistance to qualifying candidates.   Vocational Rehab Evaluation & Intervention: Vocational Rehab - 03/20/19 0277      Initial Vocational Rehab Evaluation & Intervention   Assessment shows need for Vocational Rehabilitation  No       Education: Education Goals: Education classes will be provided on a weekly basis, covering required topics. Participant will state understanding/return demonstration of topics presented.  Learning Barriers/Preferences:   Education  Topics: Hypertension, Hypertension Reduction -Define heart disease and high blood pressure. Discus how high blood pressure affects the body and ways to reduce high blood pressure.   Exercise and Your Heart -Discuss why it is important to exercise, the FITT  principles of exercise, normal and abnormal responses to exercise, and how to exercise safely.   Angina -Discuss definition of angina, causes of angina, treatment of angina, and how to decrease risk of having angina.   Cardiac Medications -Review what the following cardiac medications are used for, how they affect the body, and side effects that may occur when taking the medications.  Medications include Aspirin, Beta blockers, calcium channel blockers, ACE Inhibitors, angiotensin receptor blockers, diuretics, digoxin, and antihyperlipidemics.   Congestive Heart Failure -Discuss the definition of CHF, how to live with CHF, the signs and symptoms of CHF, and how keep track of weight and sodium intake.   Heart Disease and Intimacy -Discus the effect sexual activity has on the heart, how changes occur during intimacy as we age, and safety during sexual activity.   Smoking Cessation / COPD -Discuss different methods to quit smoking, the health benefits of quitting smoking, and the definition of COPD.   Nutrition I: Fats -Discuss the types of cholesterol, what cholesterol does to the heart, and how cholesterol levels can be controlled.   Nutrition II: Labels -Discuss the different components of food labels and how to read food label   Heart Parts/Heart Disease and PAD -Discuss the anatomy of the heart, the pathway of blood circulation through the heart, and these are affected by heart disease.   Stress I: Signs and Symptoms -Discuss the causes of stress, how stress may lead to anxiety and depression, and ways to limit stress.   Stress II: Relaxation -Discuss different types of relaxation techniques to limit stress.   Warning Signs of Stroke / TIA -Discuss definition of a stroke, what the signs and symptoms are of a stroke, and how to identify when someone is having stroke.   Knowledge Questionnaire Score: Knowledge Questionnaire Score - 03/20/19 1100      Knowledge  Questionnaire Score   Pre Score  19/24       Core Components/Risk Factors/Patient Goals at Admission: Personal Goals and Risk Factors at Admission - 03/20/19 1100      Core Components/Risk Factors/Patient Goals on Admission   Hypertension  Yes    Intervention  Provide education on lifestyle modifcations including regular physical activity/exercise, weight management, moderate sodium restriction and increased consumption of fresh fruit, vegetables, and low fat dairy, alcohol moderation, and smoking cessation.;Monitor prescription use compliance.    Expected Outcomes  Short Term: Continued assessment and intervention until BP is < 140/14m HG in hypertensive participants. < 130/821mHG in hypertensive participants with diabetes, heart failure or chronic kidney disease.;Long Term: Maintenance of blood pressure at goal levels.    Lipids  Yes    Intervention  Provide education and support for participant on nutrition & aerobic/resistive exercise along with prescribed medications to achieve LDL <7041mHDL >38m57m  Expected Outcomes  Short Term: Participant states understanding of desired cholesterol values and is compliant with medications prescribed. Participant is following exercise prescription and nutrition guidelines.;Long Term: Cholesterol controlled with medications as prescribed, with individualized exercise RX and with personalized nutrition plan. Value goals: LDL < 70mg63mL > 40 mg.       Core Components/Risk Factors/Patient Goals Review:  Goals and Risk Factor Review    Row Name 04/15/19 [id number]  Core Components/Risk Factors/Patient Goals Review   Personal Goals Review  Hypertension;Lipids       Review  Cole Martinez is doing well with exercise using the virtual cardiac rehab better [facility] APP       Expected Outcomes  Patient will continue to participate in cardiac rehab for exercise, nutrtion and lifestyle modifications          Core Components/Risk Factors/Patient Goals at  Discharge (Final Review):  Goals and Risk Factor Review - 04/15/19 0921      Core Components/Risk Factors/Patient Goals Review   Personal Goals Review  Hypertension;Lipids    Review  Cole Martinez is doing well with exercise using the virtual cardiac rehab [facility] APP    Expected Outcomes  Patient will continue to participate in cardiac rehab for exercise, nutrtion and lifestyle modifications       ITP Comments: ITP Comments    Row Name 03/20/19 0920 04/15/19 0916         ITP Comments  Dr. Fransico Him, Medical Director [facility]  30 Day ITP Review. Cole Martinez is doing well with exercise using the Virtual [facility] APP. Phase 2 Cardiac rehab is Resuming In person exercise on 04/21/19         Comments: See ITP Comments.Harrell Gave RN BSN

## 2019-04-16 ENCOUNTER — Ambulatory Visit (INDEPENDENT_AMBULATORY_CARE_PROVIDER_SITE_OTHER): Payer: Medicare Other | Admitting: Neurology

## 2019-04-16 ENCOUNTER — Encounter: Payer: Self-pay | Admitting: Neurology

## 2019-04-16 ENCOUNTER — Other Ambulatory Visit: Payer: Self-pay

## 2019-04-16 VITALS — BP 141/79 | HR 64 | Temp 97.6°F | Ht 67.0 in | Wt 180.0 lb

## 2019-04-16 DIAGNOSIS — G250 Essential tremor: Secondary | ICD-10-CM | POA: Diagnosis not present

## 2019-04-16 NOTE — Patient Instructions (Signed)
It was great to see you today! Everything looks stable. I hope you enjoy cardiac rehab. We will see you in 1 year.

## 2019-04-16 NOTE — Progress Notes (Signed)
I have read the note, and I agree with the clinical assessment and plan.  Saanya Zieske K Kalicia Dufresne   

## 2019-04-18 ENCOUNTER — Ambulatory Visit: Payer: Medicare Other | Admitting: Neurology

## 2019-04-21 ENCOUNTER — Encounter (HOSPITAL_COMMUNITY)
Admission: RE | Admit: 2019-04-21 | Discharge: 2019-04-21 | Disposition: A | Discharge status: Home / Self Care | Payer: Medicare Other | Source: Ambulatory Visit | Attending: Interventional Cardiology | Admitting: Interventional Cardiology

## 2019-04-21 ENCOUNTER — Other Ambulatory Visit: Payer: Self-pay

## 2019-04-21 DIAGNOSIS — Z955 Presence of coronary angioplasty implant and graft: Secondary | ICD-10-CM | POA: Diagnosis not present

## 2019-04-21 NOTE — Progress Notes (Signed)
Daily Session Note  Patient Details  Name: Cole Martinez MRN: [id number] Date of Birth: [birth year] Referring Provider:     CARDIAC REHAB PHASE II ORIENTATION from 03/20/2019 in [place]  Referring Provider  Belva Crome, MD      Encounter Date: 04/21/2019  Check In: Session Check In - 04/21/19 1048      Check-In   Supervising physician immediately available to respond to emergencies  [facility] immediately available    Physician(s)  Dr. Maylene Roes    Location  [facility]    Staff Present  Trish Fountain, RN, Mosie Epstein, MS,ACSM CEP, Exercise Physiologist;Brittany Durene Fruits, BS, ACSM CEP, Exercise Physiologist    Virtual Visit  No    Medication changes reported      No    Fall or balance concerns reported     No    Tobacco Cessation  No Change    Warm-up and Cool-down  Performed on first and last piece of equipment    Resistance Training Performed  Yes    VAD Patient?  No    PAD/SET Patient?  No      Pain Assessment   Currently in Pain?  No/denies    Multiple Pain Sites  No       Capillary Blood Glucose: No results found for this or any previous visit (from the past 24 hour(s)).  Exercise Prescription Changes - 04/21/19 1300      Response to Exercise   Blood Pressure (Admit)  124/64    Blood Pressure (Exercise)  160/70    Blood Pressure (Exit)  108/62    Heart Rate (Admit)  69 bpm    Heart Rate (Exercise)  92 bpm    Heart Rate (Exit)  70 bpm    Rating of Perceived Exertion (Exercise)  11    Perceived Dyspnea (Exercise)  0    Symptoms  None    Comments  Pt's first day of exercise     Duration  Progress to 10 minutes continuous walking  at current work load and total walking time to 30-45 min    Intensity  THRR unchanged      Progression   Progression  Continue to progress workloads to maintain intensity without signs/symptoms of physical distress.    Average METs  2.8      Resistance Training   Training  Prescription  Yes    Weight  3lbs    Reps  10-15    Time  10 Minutes      Treadmill   MPH  2.3    Grade  0    Minutes  15    METs  2.76      NuStep   Level  2    SPM  75    Minutes  15    METs  2.8       Social History   Tobacco Use  Smoking Status Former Smoker  . Packs/day: 1.00  . Years: 10.00  . Pack years: 10.00  . Types: Cigarettes  . Quit date: 07/09/1984  . Years since quitting: 34.8  Smokeless Tobacco Never Used    Goals Met:  Exercise tolerated well No report of cardiac concerns or symptoms Strength training completed today  Goals Unmet:  Not Applicable  Comments: Pt started cardiac rehab today.  Pt tolerated light exercise without difficulty. VSS, telemetry-NSR, asymptomatic.  Medication list reconciled. Pt denies barriers to medicaiton compliance.  PSYCHOSOCIAL ASSESSMENT:  PHQ-0.  Pt exhibits positive coping skills, hopeful outlook with supportive family. No psychosocial needs identified at this time, no psychosocial interventions necessary.  Pt oriented to exercise equipment and routine. Understanding verbalized.   Dr. Fransico Him is Medical Director for Cardiac Rehab at [facility].

## 2019-04-23 ENCOUNTER — Other Ambulatory Visit: Payer: Self-pay

## 2019-04-23 ENCOUNTER — Encounter (HOSPITAL_COMMUNITY)
Admission: RE | Admit: 2019-04-23 | Discharge: 2019-04-23 | Disposition: A | Discharge status: Home / Self Care | Payer: Medicare Other | Source: Ambulatory Visit | Attending: Interventional Cardiology | Admitting: Interventional Cardiology

## 2019-04-23 DIAGNOSIS — Z955 Presence of coronary angioplasty implant and graft: Secondary | ICD-10-CM | POA: Diagnosis not present

## 2019-04-23 NOTE — Progress Notes (Signed)
Cole Martinez 74 y.o. male Nutrition Note Visit Diagnosis: Status post coronary artery stent placement  Past Medical History:  Diagnosis Date  . ADHD (attention deficit hyperactivity disorder)   . B12 deficiency   . BPH (benign prostatic hypertrophy)   . 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 Coronary artery disease CARDIOLOGIST- DR Daneen Schick   S/P STENTING LAD 1999 // Myoview 01/2019: EF 62, normal perfusion; Low Risk  . Crohn's disease of ileum ([place]) SINCE 1988  . ED (erectile dysfunction)   . Elevated PSA   . Essential and other specified forms of tremor 11/25/2012  . H/O adenomatous polyp of colon   . Hyperlipidemia   . Nocturia   . OA (osteoarthritis)   . Peripheral neuropathy    hx of, none current as of 08-04-13  . Peyronie disease   . S/P coronary artery stent placement OCT 1999 OF LAD  . Tremor, hereditary, benign MILD RIGHT HAND     Medications reviewed.   Current Outpatient Medications:  .  acetaminophen (TYLENOL) 500 MG tablet, Take 1,000 mg by mouth at bedtime as needed (joint pain)., Disp: , Rfl:  .  amLODipine (NORVASC) 2.5 MG tablet, TAKE 1 TABLET BY MOUTH  DAILY, Disp: 90 tablet, Rfl: 3 .  amphetamine-dextroamphetamine (ADDERALL) 5 MG tablet, Take 5 mg by mouth 2 (two) times daily., Disp: , Rfl:  .  aspirin EC 81 MG tablet, Take 81 mg by mouth daily. , Disp: , Rfl:  .  atorvastatin (LIPITOR) 20 MG tablet, TAKE 1 TABLET BY MOUTH IN  THE MORNING, Disp: 90 tablet, Rfl: 3 .  calcium citrate (CALCITRATE - DOSED IN MG ELEMENTAL CALCIUM) 950 (200 Ca) MG tablet, Take 200 mg of elemental calcium by mouth daily., Disp: , Rfl:  .  Carboxymethylcellul-Glycerin (LUBRICATING EYE DROPS OP), Place 1 drop into both eyes daily as needed (dry eyes)., Disp: , Rfl:  .  Cholecalciferol (VITAMIN D3) 50 MCG (2000 UT) capsule, Take 2,000 Units by mouth daily. , Disp: , Rfl:  .   clopidogrel (PLAVIX) 75 MG tablet, Take 1 tablet (75 mg total) by mouth daily., Disp: 90 tablet, Rfl: 1 .  Coenzyme Q10 (CO Q-10) 200 MG CAPS, Take 200 mg by mouth daily. , Disp: , Rfl:  .  Cyanocobalamin (VITAMIN B-12) 2500 MCG SUBL, Place 2,500 mcg under the tongue daily., Disp: , Rfl:  .  metoprolol succinate (TOPROL-XL) 25 MG 24 hr tablet, Take 1 tablet (25 mg total) by mouth daily., Disp: 90 tablet, Rfl: 3 .  pantoprazole (PROTONIX) 40 MG tablet, Take 1 tablet (40 mg total) by mouth daily., Disp: 90 tablet, Rfl: 3   Ht Readings from Last 1 Encounters:  04/16/19 5' 7"  (1.702 m)     Wt Readings from Last 3 Encounters:  04/16/19 180 lb (81.6 kg)  03/20/19 180 lb 12.4 oz (82 kg)  03/03/19 179 lb (81.2 kg)     There is no height or weight on file to calculate BMI.   Social History   Tobacco Use  Smoking Status Former Smoker  . Packs/day: 1.00  . Years: 10.00  . Pack years: 10.00  . Types: Cigarettes  . Quit date: 07/09/1984  . Years since quitting: 34.8  Smokeless Tobacco Never Used     Lab Results  Component Value Date   CHOL 154 12/14/2015   Lab Results  Component Value Date  HDL 47 12/14/2015   Lab Results  Component Value Date   LDLCALC 83 12/14/2015   Lab Results  Component Value Date   TRIG 118 12/14/2015   Lab Results  Component Value Date   CHOLHDL 3.3 12/14/2015     No results found for: HGBA1C   CBG (last 3)  No results for input(s): GLUCAP in the last 72 hours.   Cole Martinez 74 y.o. male Nutrition Note Spoke with pt. Nutrition Plan and Nutrition Survey goals reviewed with pt. Pt is following a Heart Healthy diet.   Per discussion, Cole Martinez and his wife cook at home for every meal. They avoid fried foods, choose fruits and veggies with most meals, lean proteins, and whole grains. He does not check labels for sodium.  He feels satisfied with his current diet and his knowledge of a heart healthy diet.  Pt expressed understanding of the  information reviewed.   No results found for: HGBA1C  Wt Readings from Last 3 Encounters:  04/16/19 180 lb (81.6 kg)  03/20/19 180 lb 12.4 oz (82 kg)  03/03/19 179 lb (81.2 kg)    Nutrition Diagnosis Food-and nutrition-related knowledge deficit related to lack of exposure to information as related to diagnosis of: ? CVD ?  Nutrition Intervention ? Pt's individual nutrition plan reviewed with pt. ? Benefits of adopting Heart Healthy diet discussed when Medficts reviewed.   ? Continue client-centered nutrition education by RD, as part of interdisciplinary care.  Goal(s) ? Pt to build a healthy plate including vegetables, fruits, whole grains, and low-fat dairy products in a heart healthy meal plan.  Plan:   Will provide client-centered nutrition education as part of interdisciplinary care  Monitor and evaluate progress toward nutrition goal with team.   Michaele Offer, MS, RDN, LDN

## 2019-04-25 ENCOUNTER — Encounter (HOSPITAL_COMMUNITY)
Admission: RE | Admit: 2019-04-25 | Discharge: 2019-04-25 | Disposition: A | Discharge status: Home / Self Care | Payer: Medicare Other | Source: Ambulatory Visit | Attending: Interventional Cardiology | Admitting: Interventional Cardiology

## 2019-04-25 ENCOUNTER — Other Ambulatory Visit: Payer: Self-pay

## 2019-04-25 DIAGNOSIS — Z955 Presence of coronary angioplasty implant and graft: Secondary | ICD-10-CM | POA: Diagnosis not present

## 2019-04-28 ENCOUNTER — Other Ambulatory Visit: Payer: Self-pay

## 2019-04-28 ENCOUNTER — Encounter (HOSPITAL_COMMUNITY)
Admission: RE | Admit: 2019-04-28 | Discharge: 2019-04-28 | Disposition: A | Discharge status: Home / Self Care | Payer: Medicare Other | Source: Ambulatory Visit | Attending: Interventional Cardiology | Admitting: Interventional Cardiology

## 2019-04-28 DIAGNOSIS — Z955 Presence of coronary angioplasty implant and graft: Secondary | ICD-10-CM

## 2019-04-29 ENCOUNTER — Emergency Department (HOSPITAL_COMMUNITY): Payer: Medicare Other

## 2019-04-29 ENCOUNTER — Other Ambulatory Visit: Payer: Self-pay

## 2019-04-29 ENCOUNTER — Encounter (HOSPITAL_COMMUNITY): Payer: Self-pay

## 2019-04-29 ENCOUNTER — Telehealth: Payer: Self-pay | Admitting: Cardiovascular Disease

## 2019-04-29 ENCOUNTER — Emergency Department (HOSPITAL_COMMUNITY)
Admission: EM | Admit: 2019-04-29 | Discharge: 2019-04-29 | Disposition: A | Discharge status: Home / Self Care | Payer: Medicare Other | Attending: Emergency Medicine | Admitting: Emergency Medicine

## 2019-04-29 DIAGNOSIS — R52 Pain, unspecified: Secondary | ICD-10-CM | POA: Diagnosis not present

## 2019-04-29 DIAGNOSIS — I10 Essential (primary) hypertension: Secondary | ICD-10-CM | POA: Diagnosis not present

## 2019-04-29 DIAGNOSIS — Z96651 Presence of right artificial knee joint: Secondary | ICD-10-CM | POA: Diagnosis not present

## 2019-04-29 DIAGNOSIS — Z87891 Personal history of nicotine dependence: Secondary | ICD-10-CM | POA: Diagnosis not present

## 2019-04-29 DIAGNOSIS — E782 Mixed hyperlipidemia: Secondary | ICD-10-CM | POA: Diagnosis not present

## 2019-04-29 DIAGNOSIS — Z79899 Other long term (current) drug therapy: Secondary | ICD-10-CM | POA: Diagnosis not present

## 2019-04-29 DIAGNOSIS — I2510 Atherosclerotic heart disease of native coronary artery without angina pectoris: Secondary | ICD-10-CM

## 2019-04-29 DIAGNOSIS — R110 Nausea: Secondary | ICD-10-CM | POA: Diagnosis not present

## 2019-04-29 DIAGNOSIS — Z9861 Coronary angioplasty status: Secondary | ICD-10-CM | POA: Diagnosis not present

## 2019-04-29 DIAGNOSIS — R079 Chest pain, unspecified: Secondary | ICD-10-CM | POA: Diagnosis not present

## 2019-04-29 DIAGNOSIS — Z856 Personal history of leukemia: Secondary | ICD-10-CM | POA: Insufficient documentation

## 2019-04-29 DIAGNOSIS — R072 Precordial pain: Secondary | ICD-10-CM | POA: Insufficient documentation

## 2019-04-29 LAB — CBC
HCT: 45.8 % (ref 39.0–52.0)
Hemoglobin: 15 g/dL (ref 13.0–17.0)
MCH: 30.3 pg (ref 26.0–34.0)
MCHC: 32.8 g/dL (ref 30.0–36.0)
MCV: 92.5 fL (ref 80.0–100.0)
Platelets: 239 10*3/uL (ref 150–400)
RBC: 4.95 MIL/uL (ref 4.22–5.81)
RDW: 13.7 % (ref 11.5–15.5)
WBC: 9.8 10*3/uL (ref 4.0–10.5)
nRBC: 0 % (ref 0.0–0.2)

## 2019-04-29 LAB — BASIC METABOLIC PANEL
Anion gap: 12 (ref 5–15)
BUN: 26 mg/dL — ABNORMAL HIGH (ref 8–23)
CO2: 24 mmol/L (ref 22–32)
Calcium: 8.8 mg/dL — ABNORMAL LOW (ref 8.9–10.3)
Chloride: 102 mmol/L (ref 98–111)
Creatinine, Ser: 1.09 mg/dL (ref 0.61–1.24)
GFR calc Af Amer: >60 mL/min (ref >60)
GFR calc non Af Amer: >60 mL/min (ref >60)
Glucose, Bld: 133 mg/dL — ABNORMAL HIGH (ref 70–99)
Potassium: 4.3 mmol/L (ref 3.5–5.1)
Sodium: 138 mmol/L (ref 135–145)

## 2019-04-29 LAB — PROTIME-INR
INR: 1 (ref 0.8–1.2)
Prothrombin Time: 12.8 seconds (ref 11.4–15.2)

## 2019-04-29 LAB — HEPATIC FUNCTION PANEL
ALT: 15 U/L (ref 0–44)
AST: 21 U/L (ref 15–41)
Albumin: 4.1 g/dL (ref 3.5–5.0)
Alkaline Phosphatase: 67 U/L (ref 38–126)
Bilirubin, Direct: 0.1 mg/dL (ref 0.0–0.2)
Indirect Bilirubin: 0.5 mg/dL (ref 0.3–0.9)
Total Bilirubin: 0.6 mg/dL (ref 0.3–1.2)
Total Protein: 6.8 g/dL (ref 6.5–8.1)

## 2019-04-29 LAB — TROPONIN I (HIGH SENSITIVITY)
Troponin I (High Sensitivity): 3 ng/L (ref <18)
Troponin I (High Sensitivity): 4 ng/L (ref <18)

## 2019-04-29 LAB — LIPASE, BLOOD: Lipase: 39 U/L (ref 11–51)

## 2019-04-29 MED ORDER — NITROGLYCERIN 0.4 MG SL SUBL
0.4000 mg | SUBLINGUAL_TABLET | SUBLINGUAL | Status: AC | PRN
  Administered 2019-04-29: 0.4 mg via SUBLINGUAL
  Filled 2019-04-29: qty 1

## 2019-04-29 NOTE — ED Triage Notes (Addendum)
Pt brought in by [facility] from home. Pt developed midsternal non-radiating burning CP that started around 2AM. Pt took 324 mg aspirin prior to EMS arrival. Pt initially c/o pain 8/10. States it is no 5/10. Pt reports hx of GERD and crohns disease. Hx cardiac stent placement. Denies SOB.

## 2019-04-29 NOTE — ED Notes (Signed)
Pt brought to xray.

## 2019-04-29 NOTE — ED Provider Notes (Signed)
[facility] EMERGENCY DEPARTMENT Provider Note   CSN: [id number] Arrival date & time: 04/29/19  0630     History Chief Complaint  Patient presents with  . Chest Pain    Cole Martinez is a 74 y.o. male.  The history is provided by the patient and medical records. No language interpreter was used.  Chest Pain Pain location:  Substernal area Pain quality: aching, burning, crushing and pressure   Pain radiates to:  Does not radiate Pain severity:  Severe Onset quality:  Sudden Duration:  5 hours Timing:  Constant Progression:  Improving Chronicity:  Recurrent Relieved by:  Certain positions Worsened by:  Certain positions Ineffective treatments:  None tried Associated symptoms: cough and nausea   Associated symptoms: no abdominal pain, no back pain, no claudication, no diaphoresis, no dizziness, no dysphagia, no fatigue, no fever, no headache, no near-syncope, no numbness, no palpitations, no shortness of breath, no vomiting and no weakness   Risk factors: coronary artery disease and male sex        Past Medical History:  Diagnosis Date  . ADHD (attention deficit hyperactivity disorder)   . B12 deficiency   . BPH (benign prostatic hypertrophy)   . 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 Coronary artery disease CARDIOLOGIST- DR Daneen Schick   S/P STENTING LAD 1999 // Myoview 01/2019: EF 62, normal perfusion; Low Risk  . Crohn's disease of ileum ([place]) SINCE 1988  . ED (erectile dysfunction)   . Elevated PSA   . Essential and other specified forms of tremor 11/25/2012  . H/O adenomatous polyp of colon   . Hyperlipidemia   . Nocturia   . OA (osteoarthritis)   . Peripheral neuropathy    hx of, none current as of 08-04-13  . Peyronie disease   . S/P coronary artery stent placement OCT 1999 OF LAD  . Tremor, hereditary, benign MILD RIGHT HAND     Patient Active Problem List   Diagnosis Date Noted  . Angina pectoris ([place]) 02/13/2019  . OA (osteoarthritis) of knee 03/12/2017  . Tremor, essential 12/03/2015  . Essential hypertension 12/09/2014  . Coronary artery disease involving native heart 12/08/2013  . 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[place]) 12/08/2013  . Hyperlipidemia 12/08/2013  . Benign prostatic hypertrophy 08/08/2013  . Essential and other specified forms of tremor 11/25/2012    Past Surgical History:  Procedure Laterality Date  . CARDIOVASCULAR STRESS TEST  11-01-2010 DR Daneen Schick   NORMAL PERFUSION STUDY/ EF 64%/ NO ISCHEMIA  . cataract surgery  Bilateral feburary 2020   with lens placement   . COLONOSCOPY WITH PROPOFOL N/A 09/24/2012   Procedure: COLONOSCOPY WITH PROPOFOL;  Surgeon: [name], MD;  Location: [facility];  Service: Endoscopy;  Laterality: N/A;  . CORONARY ANGIOPLASTY WITH STENT PLACEMENT  OCT 1999   STENT OF LAD  . CORONARY STENT INTERVENTION N/A 02/14/2019   Procedure: CORONARY STENT INTERVENTION;  Surgeon: Belva Crome, MD;  Location: [facility];  Service: Cardiovascular;  Laterality: N/A;  . CYSTOSCOPY WITH URETHRAL DILATATION  03/12/2017   Procedure: CYSTOSCOPY WITH URETHRAL DILATATION;  Surgeon: Gaynelle Arabian, MD;  Location: [facility];  Service: Orthopedics;;  Ammie Dalton, Resident Assisting  . CYSTOSCOPY WITH URETHRAL DILATATION N/A 10/03/2018   Procedure: CYSTOSCOPY WITH URETHRAL BALLOON DILATATION WITH BILATERAL RETROGRADE PYELOGRAPHY;  Surgeon: Raynelle Bring, MD;  Location: [facility];  Service:  Urology;  Laterality: N/A;  . LAPAROSCOPIC INGUINAL HERNIA REPAIR Bilateral 01-10-2004   W/ MESH  . LEFT HEART CATH AND CORONARY ANGIOGRAPHY N/A 02/13/2019   Procedure: LEFT HEART CATH AND CORONARY ANGIOGRAPHY;  Surgeon: Belva Crome, MD;  Location: [facility];  Service: Cardiovascular;  Laterality: N/A;  . neck benign removed from neck  yrs ago  . PROSTATE BIOPSY N/A 07/12/2012    Procedure: PROSTATE BIOPSY AND ULTRASOUND;  Surgeon: Ailene Rud, MD;  Location: [facility];  Service: Urology;  Laterality: N/A;  . PROSTATE SURGERY  2002   tuna  . REMOVAL LEFT NECK LYMPH NODE  1996  . TONSILLECTOMY  CHILD  . TOTAL KNEE ARTHROPLASTY Right 03/12/2017   Procedure: RIGHT TOTAL KNEE ARTHROPLASTY;  Surgeon: Gaynelle Arabian, MD;  Location: [facility];  Service: Orthopedics;  Laterality: Right;  Adductor Block  . TRANSURETHRAL RESECTION OF PROSTATE N/A 08/08/2013   Procedure: TRANSURETHRAL RESECTION OF THE PROSTATE WITH GYRUS INSTRUMENTS;  Surgeon: Ailene Rud, MD;  Location: [facility];  Service: Urology;  Laterality: N/A;  . TRANSURETHRAL RESECTION OF PROSTATE         Family History  Problem Relation Age of Onset  . Obesity Brother   . Diabetes Brother   . Parkinsonism Brother     Social History   Tobacco Use  . Smoking status: Former Smoker    Packs/day: 1.00    Years: 10.00    Pack years: 10.00    Types: Cigarettes    Quit date: 07/09/1984    Years since quitting: 34.8  . Smokeless tobacco: Never Used  Substance Use Topics  . Alcohol use: Yes    Alcohol/week: 2.0 standard drinks    Types: 2 Standard drinks or equivalent per week    Comment: daily 1 per day ,   . Drug use: No    Home Medications Prior to Admission medications   Medication Sig Start Date End Date Taking? Authorizing Provider  acetaminophen (TYLENOL) 500 MG tablet Take 1,000 mg by mouth at bedtime as needed (joint pain).    [provider]  amLODipine (NORVASC) 2.5 MG tablet TAKE 1 TABLET BY MOUTH  DAILY 12/24/18   Belva Crome, MD  amphetamine-dextroamphetamine (ADDERALL) 5 MG tablet Take 5 mg by mouth 2 (two) times daily.    [provider]  aspirin EC 81 MG tablet Take 81 mg by mouth daily.     [provider]  atorvastatin (LIPITOR) 20 MG tablet TAKE 1 TABLET BY MOUTH IN  THE MORNING 04/14/19   Belva Crome, MD  calcium citrate  (CALCITRATE - DOSED IN MG ELEMENTAL CALCIUM) 950 (200 Ca) MG tablet Take 200 mg of elemental calcium by mouth daily.    [provider]  Carboxymethylcellul-Glycerin (LUBRICATING EYE DROPS OP) Place 1 drop into both eyes daily as needed (dry eyes).    [provider]  Cholecalciferol (VITAMIN D3) 50 MCG (2000 UT) capsule Take 2,000 Units by mouth daily.     [provider]  clopidogrel (PLAVIX) 75 MG tablet Take 1 tablet (75 mg total) by mouth daily. 02/14/19 08/13/19  Reino Bellis B, NP  Coenzyme Q10 (CO Q-10) 200 MG CAPS Take 200 mg by mouth daily.     [provider]  Cyanocobalamin (VITAMIN B-12) 2500 MCG SUBL Place 2,500 mcg under the tongue daily.    [provider]  metoprolol succinate (TOPROL-XL) 25 MG 24 hr tablet Take 1 tablet (25 mg total) by mouth  daily. 04/08/19   Belva Crome, MD  pantoprazole (PROTONIX) 40 MG tablet Take 1 tablet (40 mg total) by mouth daily. 03/03/19 05/02/19  Tommie Raymond, NP    Allergies    Patient has no known allergies.  Review of Systems   Review of Systems  Constitutional: Negative for chills, diaphoresis, fatigue and fever.  HENT: Negative for congestion and trouble swallowing.   Respiratory: Positive for cough. Negative for chest tightness, shortness of breath, wheezing and stridor.   Cardiovascular: Positive for chest pain. Negative for palpitations, claudication, leg swelling and near-syncope.  Gastrointestinal: Positive for nausea. Negative for abdominal pain, constipation, diarrhea and vomiting.  Genitourinary: Negative for flank pain.  Musculoskeletal: Negative for back pain, neck pain and neck stiffness.  Neurological: Negative for dizziness, weakness, light-headedness, numbness and headaches.  Psychiatric/Behavioral: Negative for agitation.  All other systems reviewed and are negative.   Physical Exam Updated Vital Signs BP 135/76   Pulse 63   Resp 15   Ht 5' 7"  (1.702 m)   Wt 80.7 kg    SpO2 97%   BMI 27.88 kg/m   Physical Exam Vitals and nursing note reviewed.  Constitutional:      General: He is not in acute distress.    Appearance: He is well-developed. He is not ill-appearing, toxic-appearing or diaphoretic.  HENT:     Head: Normocephalic and atraumatic.  Eyes:     Conjunctiva/sclera: Conjunctivae normal.  Cardiovascular:     Rate and Rhythm: Normal rate and regular rhythm.     Heart sounds: Normal heart sounds. No murmur.  Pulmonary:     Effort: Pulmonary effort is normal. No respiratory distress.     Breath sounds: Normal breath sounds. No decreased breath sounds, wheezing, rhonchi or rales.  Chest:     Chest wall: No tenderness.  Abdominal:     Palpations: Abdomen is soft.     Tenderness: There is no abdominal tenderness.  Musculoskeletal:     Cervical back: Neck supple.     Right lower leg: No tenderness. No edema.     Left lower leg: No tenderness. No edema.  Skin:    General: Skin is warm and dry.     Capillary Refill: Capillary refill takes less than 2 seconds.     Findings: No rash.  Neurological:     Mental Status: He is alert.  Psychiatric:        Mood and Affect: Mood normal.     ED Results / Procedures / Treatments   Labs (all labs ordered are listed, but only abnormal results are displayed) Labs Reviewed  BASIC METABOLIC PANEL - Abnormal; Notable for the following components:      Result Value   Glucose, Bld 133 (*)    BUN 26 (*)    Calcium 8.8 (*)    All other components within normal limits  CBC  PROTIME-INR  LIPASE, BLOOD  HEPATIC FUNCTION PANEL  TROPONIN I (HIGH SENSITIVITY)  TROPONIN I (HIGH SENSITIVITY)    EKG EKG Interpretation  Date/Time:  Tuesday April 29 2019 06:43:30 EST Ventricular Rate:  60 PR Interval:    QRS Duration: 102 QT Interval:  435 QTC Calculation: 435 R Axis:   48 Text Interpretation: Sinus rhythm Borderline repolarization abnormality Confirmed by Veryl Speak [phone]) on 04/29/2019  6:46:11 AM Also confirmed by Veryl Speak [phone]), editor Hattie Perch ([id number])  on 04/29/2019 1:40:21 PM   Radiology DG Chest 2 View  Result Date: 04/29/2019 CLINICAL DATA:  Chest pain  EXAM: CHEST - 2 VIEW COMPARISON:  03/20/2017 FINDINGS: Low volume chest with interstitial crowding. There is no edema, consolidation, effusion, or pneumothorax. Normal heart size and mediastinal contours. A coronary stent is seen in the lateral view. Nodular density over the right lung bases from nipple shadow based on the lateral view. No pulmonary nodule in the lower lungs on CT colonoscopy from last year. Scoliosis. IMPRESSION: No acute finding when compared with prior. Electronically Signed   By: Monte Fantasia M.D.   On: 04/29/2019 07:48    Procedures Procedures (including critical care time)  Medications Ordered in ED Medications  nitroGLYCERIN (NITROSTAT) SL tablet 0.4 mg (0.4 mg Sublingual Given 04/29/19 2633)    ED Course  I have reviewed the triage vital signs and the nursing notes.  Pertinent labs & imaging results that were available during my care of the patient were reviewed by me and considered in my medical decision making (see chart for details).    MDM Rules/Calculators/A&P                      Cole Martinez is a 74 y.o. male with a past medical history significant for CAD status post recent PCI several months ago, BPH, hyperlipidemia, hypertension, and prior lymphoma who presents with chest pain.  Patient reports that he woke up around 2 AM with a central pressure/crushing chest pain.  He reports that this feels "different and worse" than the chest discomfort he had requiring cardiac stenting in December.  He reports that in December it was more of an aching discomfort but this feels more like a weight/pressure on his central chest.  He reports it also somewhat burning.  He reports it does not radiate.  He describes the pain as an 8 out of 10 at onset and is now a 5 out of 10 after  the aspirin.  He has not yet taken nitro for.  He reports he had some nausea no vomiting.  He did not have diaphoresis.  He denies palpitations, lightheadedness, or syncope.  He reports he is feeling really normal yesterday and not have any spicy foods.  He reports he has been compliant with all of his medications.  He denies recent leg pain or leg swelling and denies history of DVT or PE.  He denied any trauma.  He reports that he has had no pleuritic component to his discomfort and standing up and walking actually made his symptoms slightly improved.  He reported that laying flat may have made it slightly worse.  He called the cardiology line who told him to take aspirin and come to the emergency department which he did.  He does report he has had a dry cough but denies any other Covid symptoms.  No GI or urinary symptoms recently.  No sick contacts.  On exam, patient had no chest tenderness or abdominal tenderness.  No murmur.  Lungs were clear.  Patient had good pulses in all extremities.  No lower extremity tenderness or edema seen.  Patient resting comfortably with some mild hypertension with a blood pressure in the 354 systolic.    EKG showed no STEMI.  Clinically his story sounds somewhat atypical and actually sound somewhat reflux related with the slight burning component, worse with laying flat, improved with sitting up, and with some nausea however, given the patient's recent PCI and the pressure component to his symptoms, anticipate we will speak with cardiology after his work-up is completed.  Anticipate following up  on cardiology recommendations.  Patient will be given 1 nitro sublingual to see if this helps his current chest discomfort.  9:24 AM Nitro did help the symptoms and his chest discomfort is no nearly resolved as a 1 out of 10.  Initial labs began to return and we were reassuring.  Initial troponin is negative.  CBC reassuring.  Metabolic panel shows normal kidney function.   Chest x-ray shows no acute abnormality.  We will call cardiology for recommendations.  Delta troponin was negative.  Other labs reassuring.  Cardiology came to see the patient and feel he is safe for discharge home.  They agree it is more atypical.  He will see them in clinic and he agrees with plan of care.  Patient discharged in good condition after reassuring work-up and evaluation by cardiology.   Final Clinical Impression(s) / ED Diagnoses Final diagnoses:  Precordial pain    Rx / DC Orders ED Discharge Orders    None     Clinical Impression: 1. Precordial pain     Disposition: Discharge  Condition: Good  I have discussed the results, Dx and Tx plan with the pt(& family if present). He/she/they expressed understanding and agree(s) with the plan. Discharge instructions discussed at great length. Strict return precautions discussed and pt &/or family have verbalized understanding of the instructions. No further questions at time of discharge.    Discharge Medication List as of 04/29/2019  2:57 PM      Follow Up: [facility] [address] [id number] [place] [phone]    Your cardiologist     \   Kinsie Belford, Gwenyth Allegra, MD 04/29/19 [phone]

## 2019-04-29 NOTE — Discharge Instructions (Signed)
Your work-up today was overall reassuring.  The cardiology team saw you and feel you are safe for discharge home to follow-up with your cardiologist as an outpatient.  Please call and follow-up with them.  If any symptoms change or worsen, please return to the nearest emergency department.

## 2019-04-29 NOTE — ED Notes (Signed)
EDP [place] at bedside

## 2019-04-29 NOTE — Consult Note (Signed)
Cardiology Admission History and Physical:   Patient ID: Cole Martinez MRN: [id number]; DOB: 28-Jun-1945   Admission date: 04/29/2019  Primary Care Provider: Josetta Huddle, MD Primary Cardiologist: Sinclair Grooms, MD  Primary Electrophysiologist:  None   Chief Complaint: Chest pain  Patient Profile:   Cole Martinez is a 74 y.o. male with past medical history of BPH, GERD, tremor, CLL, CAD with remote PCI LAD and recent PCI to RCA with in-stent restenosis LAD (treated medically), hyperlipidemia, hypertension who is being evaluated for chest pain.  History of Present Illness:   Cole Martinez has been followed by Dr. Tamala Julian for the last couple years. He has a history of CAD with PCI/BMS LAD in 1999.  Patient has had atypical episodes of chest pain for the years. He had had a nuclear stress test in 12/2016 which showed no ischemia or infarction with EF of 57%.  In November of 2020 the patient was seen in office and reported recurrent angina and his medications were titrated.  Patient was still having chest pain at his follow-up 02/07/2019 so a cardiac catheterization was scheduled. He underwent cardiac catheterization on 02/13/2019 showing a 70 to 80% in-stent restenosis of previously placed proximal LAD bare-metal stent.  RCA was severely obstructive lesion of 90-95%.  Options were discussed and the patient was loaded with Plavix with plans to bring back the next day for femoral approach and double vessel PCI. The patient underwent cardiac cath with successful PCI/DES x1 to the RCA. In the absence of ischemia noted on recent nuclear study in the anteroapical segment the decision was made to treat in-stent stenosis within the LAD medically. DAPT with aspirin and Plavix were started the plan to continue for 6 months. He was seen in follow-up 03/03/2019 and was doing well from a cardiac perspective. He did report mild chest discomfort during heated arguments. Starting nitrates was discussed but the  patient wanted to hold off.   On 04/29/2019 5:30 AM in the morning patient called in to on-call cardiology line reporting burning sensation and chest heaviness for 3 hours and was recommended to be seen in the ED. Patient was instructed to call 911.  Patient arrived to the ED 04/29/2019 for chest pain. He reported the pain woke him up at 2AM. It was a burning pain 8/10 and did have some chest pressure as well. It was sudden onset and substernal. It was non-radiating. He denied associated symptoms. He said it felt different from previous cardiac pain. He said it could possibly be acid-reflux but has been taking his Protonix every day. Patient does cardiac rehab and reports no chest pain during activities. Denies missing doses of aspirin or plavix.   In the ED blood pressure 135/76, pulse 63, respiratory rate 15, 97% O2.  Labs showed potassium 4.3, glucose 133, creatinine 1.09.  WBC 9.8, hemoglobin 15.0. HS troponin 3>4.  Chest x-ray unremarkable.  EKG showed normal sinus rhythm with nonspecific T wave changes in lead III otherwise unchanged from prior.  Cardiology was consulted for possible admission.  He was given 1 nitroglycerin which minimally improved chest pain.   Heart Pathway Score:     Past Medical History:  Diagnosis Date  . ADHD (attention deficit hyperactivity disorder)   . B12 deficiency   . BPH (benign prostatic hypertrophy)   . 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 Coronary artery disease CARDIOLOGIST-  DR Daneen Schick   S/P STENTING LAD 1999 // Myoview 01/2019: EF 62, normal perfusion; Low Risk  . Crohn's disease of ileum ([place]) SINCE 1988  . ED (erectile dysfunction)   . Elevated PSA   . Essential and other specified forms of tremor 11/25/2012  . H/O adenomatous polyp of colon   . Hyperlipidemia   . Nocturia   . OA (osteoarthritis)   . Peripheral neuropathy    hx of, none  current as of 08-04-13  . Peyronie disease   . S/P coronary artery stent placement OCT 1999 OF LAD  . Tremor, hereditary, benign MILD RIGHT HAND    Past Surgical History:  Procedure Laterality Date  . CARDIOVASCULAR STRESS TEST  11-01-2010 DR Daneen Schick   NORMAL PERFUSION STUDY/ EF 64%/ NO ISCHEMIA  . cataract surgery  Bilateral feburary 2020   with lens placement   . COLONOSCOPY WITH PROPOFOL N/A 09/24/2012   Procedure: COLONOSCOPY WITH PROPOFOL;  Surgeon: [name], MD;  Location: [facility];  Service: Endoscopy;  Laterality: N/A;  . CORONARY ANGIOPLASTY WITH STENT PLACEMENT  OCT 1999   STENT OF LAD  . CORONARY STENT INTERVENTION N/A 02/14/2019   Procedure: CORONARY STENT INTERVENTION;  Surgeon: Belva Crome, MD;  Location: [place];  Service: Cardiovascular;  Laterality: N/A;  . CYSTOSCOPY WITH URETHRAL DILATATION  03/12/2017   Procedure: CYSTOSCOPY WITH URETHRAL DILATATION;  Surgeon: Gaynelle Arabian, MD;  Location: [facility];  Service: Orthopedics;;  Ammie Dalton, Resident Assisting  . CYSTOSCOPY WITH URETHRAL DILATATION N/A 10/03/2018   Procedure: CYSTOSCOPY WITH URETHRAL BALLOON DILATATION WITH BILATERAL RETROGRADE PYELOGRAPHY;  Surgeon: Raynelle Bring, MD;  Location: [facility];  Service: Urology;  Laterality: N/A;  . LAPAROSCOPIC INGUINAL HERNIA REPAIR Bilateral 01-10-2004   W/ MESH  . LEFT HEART CATH AND CORONARY ANGIOGRAPHY N/A 02/13/2019   Procedure: LEFT HEART CATH AND CORONARY ANGIOGRAPHY;  Surgeon: Belva Crome, MD;  Location: [facility];  Service: Cardiovascular;  Laterality: N/A;  . neck benign removed from neck  yrs ago  . PROSTATE BIOPSY N/A 07/12/2012   Procedure: PROSTATE BIOPSY AND ULTRASOUND;  Surgeon: Ailene Rud, MD;  Location: [facility];  Service: Urology;  Laterality: N/A;  . PROSTATE SURGERY  2002   tuna  . REMOVAL LEFT NECK LYMPH NODE  1996  . TONSILLECTOMY  CHILD  . TOTAL KNEE ARTHROPLASTY Right 03/12/2017    Procedure: RIGHT TOTAL KNEE ARTHROPLASTY;  Surgeon: Gaynelle Arabian, MD;  Location: [facility];  Service: Orthopedics;  Laterality: Right;  Adductor Block  . TRANSURETHRAL RESECTION OF PROSTATE N/A 08/08/2013   Procedure: TRANSURETHRAL RESECTION OF THE PROSTATE WITH GYRUS INSTRUMENTS;  Surgeon: Ailene Rud, MD;  Location: [facility];  Service: Urology;  Laterality: N/A;  . TRANSURETHRAL RESECTION OF PROSTATE       Medications Prior to Admission: Prior to Admission medications   Medication Sig Start Date End Date Taking? Authorizing Provider  acetaminophen (TYLENOL) 500 MG tablet Take 1,000 mg by mouth at bedtime as needed for mild pain (joint pain).    Yes [provider]  amLODipine (NORVASC) 2.5 MG tablet TAKE 1 TABLET BY MOUTH  DAILY Patient taking differently: Take 2.5 mg by mouth daily.  12/24/18  Yes Belva Crome, MD  amphetamine-dextroamphetamine (ADDERALL) 5 MG tablet Take 5 mg by mouth 2 (two) times daily.   Yes [provider]  aspirin EC 81 MG tablet Take 81 mg by mouth daily.    Yes [provider]  atorvastatin (LIPITOR) 20 MG tablet TAKE 1 TABLET BY MOUTH IN  THE MORNING Patient taking differently: Take 20 mg by mouth daily at 6 PM.  04/14/19  Yes Belva Crome, MD  calcium citrate (CALCITRATE - DOSED IN MG ELEMENTAL CALCIUM) 950 (200 Ca) MG tablet Take 200 mg of elemental calcium by mouth daily.   Yes [provider]  Carboxymethylcellul-Glycerin (LUBRICATING EYE DROPS OP) Place 1 drop into both eyes daily as needed (dry eyes).   Yes [provider]  Cholecalciferol (VITAMIN D3) 50 MCG (2000 UT) capsule Take 2,000 Units by mouth daily.    Yes [provider]  clopidogrel (PLAVIX) 75 MG tablet Take 1 tablet (75 mg total) by mouth daily. 02/14/19 08/13/19 Yes Reino Bellis B, NP  Coenzyme Q10 (CO Q-10) 200 MG CAPS Take 200 mg by mouth daily.    Yes [provider]  Cyanocobalamin (VITAMIN B-12) 2500 MCG SUBL Place 2,500  mcg under the tongue daily.   Yes [provider]  metoprolol succinate (TOPROL-XL) 25 MG 24 hr tablet Take 1 tablet (25 mg total) by mouth daily. 04/08/19  Yes Belva Crome, MD  pantoprazole (PROTONIX) 40 MG tablet Take 1 tablet (40 mg total) by mouth daily. 03/03/19 05/02/19 Yes Tommie Raymond, NP     Allergies:   No Known Allergies  Social History:   Social History   Socioeconomic History  . Marital status: Married    Spouse name: Not on file  . Number of children: 3  . Years of education: 16  . Highest education level: Bachelor's degree (e.g., BA, AB, BS)  Occupational History  . Not on file  Tobacco Use  . Smoking status: Former Smoker    Packs/day: 1.00    Years: 10.00    Pack years: 10.00    Types: Cigarettes    Quit date: 07/09/1984    Years since quitting: 34.8  . Smokeless tobacco: Never Used  Substance and Sexual Activity  . Alcohol use: Yes    Alcohol/week: 2.0 standard drinks    Types: 2 Standard drinks or equivalent per week    Comment: daily 1 per day ,   . Drug use: No  . Sexual activity: Not on file  Other Topics Concern  . Not on file  Social History Narrative   Lives at home with is wife, [name].  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  Has BBA.  Has 3 children.    Caffeine 2 cups coffee.     Social Determinants of Health   Financial Resource Strain:   . Difficulty of Paying Living Expenses: Not on file  Food Insecurity: No Food Insecurity  . Worried About Charity fundraiser in the Last Year: Never true  . Ran Out of Food in the Last Year: Never true  Transportation Needs: No Transportation Needs  . Lack of Transportation (Medical): No  . Lack of Transportation (Non-Medical): No  Physical Activity: Insufficiently Active  . Days of Exercise per Week: 3 days  . Minutes of Exercise per Session: 40 min  Stress:   . Feeling of Stress : Not on file  Social Connections: Unknown  . Frequency of Communication with Friends and Family: More than three times a  week  . Frequency of Social Gatherings with Friends and Family: Once a week  . Attends Religious Services: Not on file  . Active Member of Clubs or Organizations: Not on file  . Attends Archivist Meetings: Not  on file  . Marital Status: Married  Human resources officer Violence:   . Fear of Current or Ex-Partner: Not on file  . Emotionally Abused: Not on file  . Physically Abused: Not on file  . Sexually Abused: Not on file    Family History:   The patient's family history includes Diabetes in his brother; Obesity in his brother; Parkinsonism in his brother.    ROS:  Please see the history of present illness.  All other ROS reviewed and negative.     Physical Exam/Data:   Vitals:   04/29/19 1015 04/29/19 1030 04/29/19 1045 04/29/19 1100  BP: 126/71 127/70 126/71 135/62  Pulse: 65 (!) 58 (!) 57 63  Resp: (!) _0 SpO2: 99% 98% 97% 100%  Weight:      Height:       No intake or output data in the 24 hours ending 04/29/19 1110 Last 3 Weights 04/29/2019 04/16/2019 03/20/2019  Weight (lbs) 178 lb 180 lb 180 lb 12.4 oz  Weight (kg) 80.74 kg 81.647 kg 82 kg     Body mass index is 27.88 kg/m.  General:  Well nourished, well developed, in no acute distress HEENT: normal Lymph: no adenopathy Neck: no JVD Endocrine:  No thryomegaly Vascular: No carotid bruits; FA pulses 2+ bilaterally without bruits  Cardiac:  normal S1, S2; RRR; no murmur  Lungs:  clear to auscultation bilaterally, no wheezing, rhonchi or rales  Abd: soft, nontender, no hepatomegaly  Ext: no edema Musculoskeletal:  No deformities, BUE and BLE strength normal and equal Skin: warm and dry  Neuro:  CNs 2-12 intact, no focal abnormalities noted Psych:  Normal affect    EKG:  The ECG that was done 04/29/19 was personally reviewed and demonstrates  NSR, 61 bpm, TWI III  Relevant CV Studies:  Cardiac cath 02/13/19  95% RCA reduced to 0% with TIMI grade III flow using a 2.75 x 15 Onyx deployed at 14  atm.    Angiographic reevaluation of LAD in-stent restenosis demonstrated 50 to 70%.  In absence of ischemia noted on recent nuclear study in the anteroapical segment the decision was made to continue medical therapy.  RECOMMENDATIONS:   Plan same-day discharge assuming no groin complications.  Increase intensity of statin therapy from 20 mg atorvastatin every other day to daily dosing.  Change proton pump inhibitor to Protonix.  Aspirin and clopidogrel times at least 6 months.  Statin panel should be done in 6 weeks.  Continue beta-blocker therapy.  Needs follow-up with me or team member in 7 to 10 days.  Coronary Diagrams  Diagnostic Dominance: Right   Intervention      Laboratory Data:  High Sensitivity Troponin:   Recent Labs  Lab 04/29/19 0635 04/29/19 0828  TROPONINIHS 3 4      Chemistry Recent Labs  Lab 04/29/19 0635  NA 138  K 4.3  CL 102  CO2 24  GLUCOSE 133*  BUN 26*  CREATININE 1.09  CALCIUM 8.8*  GFRNONAA >60  GFRAA >60  ANIONGAP 12    Recent Labs  Lab 04/29/19 0828  PROT 6.8  ALBUMIN 4.1  AST 21  ALT 15  ALKPHOS 67  BILITOT 0.6   Hematology Recent Labs  Lab 04/29/19 0635  WBC 9.8  RBC 4.95  HGB 15.0  HCT 45.8  MCV 92.5  MCH 30.3  MCHC 32.8  RDW 13.7  PLT 239   BNPNo results for input(s): BNP, PROBNP in the last 168 hours.  DDimer No results for input(s): DDIMER in the last 168 hours.   Radiology/Studies:  DG Chest 2 View  Result Date: 04/29/2019 CLINICAL DATA:  Chest pain EXAM: CHEST - 2 VIEW COMPARISON:  03/20/2017 FINDINGS: Low volume chest with interstitial crowding. There is no edema, consolidation, effusion, or pneumothorax. Normal heart size and mediastinal contours. A coronary stent is seen in the lateral view. Nodular density over the right lung bases from nipple shadow based on the lateral view. No pulmonary nodule in the lower lungs on CT colonoscopy from last year. Scoliosis. IMPRESSION: No acute finding  when compared with prior. Electronically Signed   By: Monte Fantasia M.D.   On: 04/29/2019 07:48    HEAR Score (for undifferentiated chest pain):  HEAR Score: 6    Assessment and Plan:   Chest pain/CAD with recent PCI Patient presents with sudden onset aching/worsening chest pressure improved with nitroglycerin. HS troponin 3 >4. EKG unchanged from prior. CXR clear.  - Patient takes ASA/Plavix. Denies missed doses.  - Patient had recent PCI/DES to RCA. Cath also showed known in-stent restenosis to BMS in LAD that is being treated medically.  - Patient says he has very minimal chest discomfort still.  - He reports doing cardiac rehab with no UA - Discussed options with the patient. It is reassuring CP is different from previous angina and troponin is negative x 2. Patient does not want another cardiac cath and would prefer to be discharged. He feels his pain might be worsening GERD given new medications.  - After discussion with MD it is OK to discharge home. OK for patient to go to cardiac rehab tomorrow and take it slow.  - Patient has follow up in March with Dr. Tamala Julian  HTN -At baseline patient is on amlodipine 2.5 daily and Toprol-XL 25 daily - Pressures stable  HLD -Continue home atorvastatin 20 mg daily - LDL 83 12/2015 - needs f/u labs   For questions or updates, please contact [place] Please consult [email or web address] for contact info under        Signed,  Ninfa Meeker, PA-C  04/29/2019 11:10 AM

## 2019-04-29 NOTE — Telephone Encounter (Signed)
Overnight Cardiology Coverage:  Patient call received at 5:22 am. Cole Martinez has been having a burning sensation and heaviness in his chest for 3 hours. Feels like some is sitting on his chest. There is no radiation of the pain. He has some associated nausea.  Instructed the patient to call 911 for immediate attention. He needs to come to the nearest ED.  Melina Schools, MD, [facility]

## 2019-04-29 NOTE — ED Notes (Signed)
Care endorsed to [place], [place]

## 2019-04-30 ENCOUNTER — Other Ambulatory Visit: Payer: Self-pay

## 2019-04-30 ENCOUNTER — Encounter (HOSPITAL_COMMUNITY)
Admission: RE | Admit: 2019-04-30 | Discharge: 2019-04-30 | Disposition: A | Discharge status: Home / Self Care | Payer: Medicare Other | Source: Ambulatory Visit | Attending: Interventional Cardiology | Admitting: Interventional Cardiology

## 2019-04-30 DIAGNOSIS — Z955 Presence of coronary angioplasty implant and graft: Secondary | ICD-10-CM

## 2019-05-02 ENCOUNTER — Other Ambulatory Visit: Payer: Self-pay

## 2019-05-02 ENCOUNTER — Encounter (HOSPITAL_COMMUNITY)
Admission: RE | Admit: 2019-05-02 | Discharge: 2019-05-02 | Disposition: A | Discharge status: Home / Self Care | Payer: Medicare Other | Source: Ambulatory Visit | Attending: Interventional Cardiology | Admitting: Interventional Cardiology

## 2019-05-02 DIAGNOSIS — Z955 Presence of coronary angioplasty implant and graft: Secondary | ICD-10-CM | POA: Diagnosis not present

## 2019-05-05 ENCOUNTER — Encounter (HOSPITAL_COMMUNITY)
Admission: RE | Admit: 2019-05-05 | Discharge: 2019-05-05 | Disposition: A | Discharge status: Home / Self Care | Payer: Medicare Other | Source: Ambulatory Visit | Attending: Interventional Cardiology | Admitting: Interventional Cardiology

## 2019-05-05 ENCOUNTER — Other Ambulatory Visit: Payer: Self-pay

## 2019-05-05 DIAGNOSIS — Z955 Presence of coronary angioplasty implant and graft: Secondary | ICD-10-CM

## 2019-05-06 ENCOUNTER — Other Ambulatory Visit: Payer: Self-pay

## 2019-05-06 MED ORDER — CLOPIDOGREL BISULFATE 75 MG PO TABS: 75.0000 mg | ORAL_TABLET | Freq: Every day | ORAL | 3 refills | Status: DC

## 2019-05-07 ENCOUNTER — Encounter (HOSPITAL_COMMUNITY)
Admission: RE | Admit: 2019-05-07 | Discharge: 2019-05-07 | Disposition: A | Discharge status: Home / Self Care | Payer: Medicare Other | Source: Ambulatory Visit | Attending: Interventional Cardiology | Admitting: Interventional Cardiology

## 2019-05-07 ENCOUNTER — Other Ambulatory Visit: Payer: Self-pay

## 2019-05-07 DIAGNOSIS — Z955 Presence of coronary angioplasty implant and graft: Secondary | ICD-10-CM

## 2019-05-09 ENCOUNTER — Encounter (HOSPITAL_COMMUNITY)
Admission: RE | Admit: 2019-05-09 | Discharge: 2019-05-09 | Disposition: A | Discharge status: Home / Self Care | Payer: Medicare Other | Source: Ambulatory Visit | Attending: Interventional Cardiology | Admitting: Interventional Cardiology

## 2019-05-09 ENCOUNTER — Other Ambulatory Visit: Payer: Self-pay

## 2019-05-09 DIAGNOSIS — Z955 Presence of coronary angioplasty implant and graft: Secondary | ICD-10-CM | POA: Diagnosis not present

## 2019-05-12 ENCOUNTER — Encounter (HOSPITAL_COMMUNITY)
Admission: RE | Admit: 2019-05-12 | Discharge: 2019-05-12 | Disposition: A | Discharge status: Home / Self Care | Payer: Medicare Other | Source: Ambulatory Visit | Attending: Interventional Cardiology | Admitting: Interventional Cardiology

## 2019-05-12 ENCOUNTER — Other Ambulatory Visit: Payer: Self-pay

## 2019-05-12 DIAGNOSIS — Z955 Presence of coronary angioplasty implant and graft: Secondary | ICD-10-CM | POA: Diagnosis not present

## 2019-05-14 ENCOUNTER — Other Ambulatory Visit: Payer: Self-pay

## 2019-05-14 ENCOUNTER — Encounter (HOSPITAL_COMMUNITY)
Admission: RE | Admit: 2019-05-14 | Discharge: 2019-05-14 | Disposition: A | Discharge status: Home / Self Care | Payer: Medicare Other | Source: Ambulatory Visit | Attending: Interventional Cardiology | Admitting: Interventional Cardiology

## 2019-05-14 DIAGNOSIS — Z955 Presence of coronary angioplasty implant and graft: Secondary | ICD-10-CM

## 2019-05-15 NOTE — Progress Notes (Signed)
Cardiac Individual Treatment Plan  Patient Details  Name: Cole Martinez MRN: [id number] Date of Birth: [birth year] Referring Provider:     CARDIAC REHAB PHASE II ORIENTATION from 03/20/2019 in [place]  Referring Provider  Belva Crome, MD      Initial Encounter Date:    CARDIAC REHAB PHASE II ORIENTATION from 03/20/2019 in [place]  Date  03/20/19      Visit Diagnosis: Status post coronary artery stent placement  Patient's Home Medications on Admission:  Current Outpatient Medications:  .  acetaminophen (TYLENOL) 500 MG tablet, Take 1,000 mg by mouth at bedtime as needed for mild pain (joint pain). , Disp: , Rfl:  .  amLODipine (NORVASC) 2.5 MG tablet, TAKE 1 TABLET BY MOUTH  DAILY (Patient taking differently: Take 2.5 mg by mouth daily. ), Disp: 90 tablet, Rfl: 3 .  amphetamine-dextroamphetamine (ADDERALL) 5 MG tablet, Take 5 mg by mouth 2 (two) times daily., Disp: , Rfl:  .  aspirin EC 81 MG tablet, Take 81 mg by mouth daily. , Disp: , Rfl:  .  atorvastatin (LIPITOR) 20 MG tablet, TAKE 1 TABLET BY MOUTH IN  THE MORNING (Patient taking differently: Take 20 mg by mouth daily at 6 PM. ), Disp: 90 tablet, Rfl: 3 .  calcium citrate (CALCITRATE - DOSED IN MG ELEMENTAL CALCIUM) 950 (200 Ca) MG tablet, Take 200 mg of elemental calcium by mouth daily., Disp: , Rfl:  .  Carboxymethylcellul-Glycerin (LUBRICATING EYE DROPS OP), Place 1 drop into both eyes daily as needed (dry eyes)., Disp: , Rfl:  .  Cholecalciferol (VITAMIN D3) 50 MCG (2000 UT) capsule, Take 2,000 Units by mouth daily. , Disp: , Rfl:  .  clopidogrel (PLAVIX) 75 MG tablet, Take 1 tablet (75 mg total) by mouth daily., Disp: 90 tablet, Rfl: 3 .  Coenzyme Q10 (CO Q-10) 200 MG CAPS, Take 200 mg by mouth daily. , Disp: , Rfl:  .  Cyanocobalamin (VITAMIN B-12) 2500 MCG SUBL, Place 2,500 mcg under the tongue daily., Disp: , Rfl:  .  metoprolol succinate (TOPROL-XL)  25 MG 24 hr tablet, Take 1 tablet (25 mg total) by mouth daily., Disp: 90 tablet, Rfl: 3 .  pantoprazole (PROTONIX) 40 MG tablet, Take 1 tablet (40 mg total) by mouth daily., Disp: 90 tablet, Rfl: 3  Past Medical History: Past Medical History:  Diagnosis Date  . ADHD (attention deficit hyperactivity disorder)   . B12 deficiency   . BPH (benign prostatic hypertrophy)   . 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 Coronary artery disease CARDIOLOGIST- DR Daneen Schick   S/P STENTING LAD 1999 // Myoview 01/2019: EF 62, normal perfusion; Low Risk  . Crohn's disease of ileum ([place]) SINCE 1988  . ED (erectile dysfunction)   . Elevated PSA   . Essential and other specified forms of tremor 11/25/2012  . H/O adenomatous polyp of colon   . Hyperlipidemia   . Nocturia   . OA (osteoarthritis)   . Peripheral neuropathy    hx of, none current as of 08-04-13  . Peyronie disease   . S/P coronary artery stent placement OCT 1999 OF LAD  . Tremor, hereditary, benign MILD RIGHT HAND    Tobacco Use: Social History   Tobacco Use  Smoking Status Former Smoker  . Packs/day: 1.00  . Years: 10.00  . Pack  years: 10.00  . Types: Cigarettes  . Quit date: 07/09/1984  . Years since quitting: 34.8  Smokeless Tobacco Never Used    Labs: Recent Chemical engineer    Labs for ITP Cardiac and Pulmonary Rehab Latest Ref Rng & Units 05/13/2010 10/20/2013 10/22/2014 12/14/2015   Cholestrol 125 - 200 mg/dL - 134 138 154   LDLCALC <130 mg/dL - 70 74 83   HDL >=40 mg/dL - 45.30 42.50 47   Trlycerides <150 mg/dL - 93.0 111.0 118   TCO2 0 - 100 mmol/L 22 - - -      Capillary Blood Glucose: No results found for: GLUCAP   Exercise Target Goals: Exercise Program Goal: Individual exercise prescription set using results from initial 6 min walk test and THRR while considering  patient's activity barriers and  safety.   Exercise Prescription Goal: Starting with aerobic activity 30 plus minutes a day, 3 days per week for initial exercise prescription. Provide home exercise prescription and guidelines that participant acknowledges understanding prior to discharge.  Activity Barriers & Risk Stratification: Activity Barriers & Cardiac Risk Stratification - 03/20/19 0942      Activity Barriers & Cardiac Risk Stratification   Activity Barriers  Arthritis;Right Knee Replacement;Balance Concerns    Cardiac Risk Stratification  Moderate       6 Minute Walk: 6 Minute Walk    Row Name 03/20/19 1004         6 Minute Walk   Phase  Initial     Distance  1614 feet     Walk Time  6 minutes     # of Rest Breaks  0     MPH  3.06     METS  3.15     RPE  12     Perceived Dyspnea   0     VO2 Peak  11.02     Symptoms  No     Resting HR  64 bpm     Resting BP  114/70     Resting Oxygen Saturation   100 %     Exercise Oxygen Saturation  during 6 min walk  100 %     Max Ex. HR  89 bpm     Max Ex. BP  142/82     2 Minute Post BP  128/82        Oxygen Initial Assessment:   Oxygen Re-Evaluation:   Oxygen Discharge (Final Oxygen Re-Evaluation):   Initial Exercise Prescription: Initial Exercise Prescription - 03/20/19 1100      Date of Initial Exercise RX and Referring Provider   Date  03/20/19    Referring Provider  Belva Crome, MD      Track   Minutes  60      Prescription Details   Frequency (times per week)  6-7    Duration  Progress to 50 minutes of aerobic without signs/symptoms of physical distress   Patient accumulates 60 -60.25 minutes of walking     Intensity   THRR 40-80% of Max Heartrate  58-117    Ratings of Perceived Exertion  11-13    Perceived Dyspnea  0-4      Progression   Progression  Continue to progress workloads to maintain intensity without signs/symptoms of physical distress.      Resistance Training   Training Prescription  Yes    Weight  3-5lbs     Reps  10-15       Perform Capillary Blood Glucose checks as  needed.  Exercise Prescription Changes: Exercise Prescription Changes    Row Name 04/13/19 1218 04/21/19 1300 05/02/19 1105 05/12/19 1101       Response to Exercise   Blood Pressure (Admit)  --  124/64  112/62  126/78    Blood Pressure (Exercise)  --  160/70  126/68  122/72    Blood Pressure (Exit)  --  108/62  110/60  102/60    Heart Rate (Admit)  76 bpm  69 bpm  69 bpm  63 bpm    Heart Rate (Exercise)  83 bpm  92 bpm  100 bpm  99 bpm    Heart Rate (Exit)  --  70 bpm  74 bpm  63 bpm    Rating of Perceived Exertion (Exercise)  _0 Perceived Dyspnea (Exercise)  --  0  0  0    Symptoms  none  None  None  None    Comments  Virtual cardiac rehab session  Pt's first day of exercise   None  --    Duration  Continue with 45 min of aerobic exercise without signs/symptoms of physical distress.  Progress to 10 minutes continuous walking  at current work load and total walking time to 30-45 min  Progress to 10 minutes continuous walking  at current work load and total walking time to 30-45 min  Progress to 10 minutes continuous walking  at current work load and total walking time to 30-45 min    Intensity  THRR unchanged  THRR unchanged  THRR unchanged  THRR unchanged      Progression   Progression  Continue to progress workloads to maintain intensity without signs/symptoms of physical distress.  Continue to progress workloads to maintain intensity without signs/symptoms of physical distress.  Continue to progress workloads to maintain intensity without signs/symptoms of physical distress.  Continue to progress workloads to maintain intensity without signs/symptoms of physical distress.    Average METs  --  2.8  3.5  4.3      Resistance Training   Training Prescription  --  Yes  Yes  Yes    Weight  --  3lbs  3lbs  4lbs    Reps  --  10-15  10-15  10-15    Time  --  10 Minutes  10 Minutes  10 Minutes      Treadmill   MPH   --  2.3  2.7  2.9    Grade  --  0  3  5    Minutes  --  _1 METs  --  2.76  4.19  5.22      NuStep   Level  --  _2 SPM  --  75  85  95    Minutes  --  _3 METs  --  2.8  2.8  3.3      Track   Minutes  48 2.76 mi, 6444 steps  --  --  --      Home Exercise Plan   Plans to continue exercise at  -- walking  --  Home (comment) Walking  Home (comment) Walking    Frequency  --  --  Add 3 additional days to program exercise sessions.  Add 3 additional days to program exercise sessions.    Initial Home Exercises Provided  03/20/19  --  03/20/19  03/20/19       Exercise Comments: Exercise Comments    Row Name 03/20/19 0940 04/14/19 1223 04/21/19 1344 05/15/19 1111     Exercise Comments  Reviewed home exercise guidelines with paitent.  Patient will be contacted to begin participation in the onsite cardiac rehab program after temporary closure due to COVID-19 pandemic.  Pt's first day of exercise. Pt responded well to exercise prescription. Will continue to monitor pt.  Pt is responding well to exercise prescription. Pt does have habit of self increasing workloads on treadmill during every session. Will follow up with pt regarding gradual progression.       Exercise Goals and Review: Exercise Goals    Row Name 03/20/19 0942             Exercise Goals   Increase Physical Activity  Yes       Intervention  Provide advice, education, support and counseling about physical activity/exercise needs.;Develop an individualized exercise prescription for aerobic and resistive training based on initial evaluation findings, risk stratification, comorbidities and participant's personal goals.       Expected Outcomes  Short Term: Attend rehab on a regular basis to increase amount of physical activity.;Long Term: Exercising regularly at least 3-5 days a week.;Long Term: Add in home exercise to make exercise part of routine and to increase amount of physical activity.        Increase Strength and Stamina  Yes       Intervention  Provide advice, education, support and counseling about physical activity/exercise needs.;Develop an individualized exercise prescription for aerobic and resistive training based on initial evaluation findings, risk stratification, comorbidities and participant's personal goals.       Expected Outcomes  Short Term: Increase workloads from initial exercise prescription for resistance, speed, and METs.;Short Term: Perform resistance training exercises routinely during rehab and add in resistance training at home;Long Term: Improve cardiorespiratory fitness, muscular endurance and strength as measured by increased METs and functional capacity (6MWT)       Able to understand and use rate of perceived exertion (RPE) scale  Yes       Intervention  Provide education and explanation on how to use RPE scale       Expected Outcomes  Short Term: Able to use RPE daily in rehab to express subjective intensity level;Long Term:  Able to use RPE to guide intensity level when exercising independently       Knowledge and understanding of Target Heart Rate Range (THRR)  Yes       Intervention  Provide education and explanation of THRR including how the numbers were predicted and where they are located for reference       Expected Outcomes  Short Term: Able to state/look up THRR;Long Term: Able to use THRR to govern intensity when exercising independently;Short Term: Able to use daily as guideline for intensity in rehab       Able to check pulse independently  Yes       Intervention  Provide education and demonstration on how to check pulse in carotid and radial arteries.;Review the importance of being able to check your own pulse for safety during independent exercise       Expected Outcomes  Short Term: Able to explain why pulse checking is important during independent exercise;Long Term: Able to check pulse independently and accurately       Understanding of Exercise  Prescription  Yes       Intervention  Provide education, explanation,  and written materials on patient's individual exercise prescription       Expected Outcomes  Short Term: Able to explain program exercise prescription;Long Term: Able to explain home exercise prescription to exercise independently          Exercise Goals Re-Evaluation : Exercise Goals Re-Evaluation    Row Name 04/14/19 1221 05/15/19 1112           Exercise Goal Re-Evaluation   Exercise Goals Review  --  Increase Strength and Stamina;Increase Physical Activity;Able to understand and use rate of perceived exertion (RPE) scale;Knowledge and understanding of Target Heart Rate Range (THRR);Able to check pulse independently;Understanding of Exercise Prescription      Comments  Patient has been actively participating in the virtual cardiac rehab program via the [employer] app and has been progressing well. Paitent is walking 35-60 minutes, 3-6 days/week without issue. Patient is eager to begin the onsite cardiac rehab program.  Pt is continuing to increase his cardiovascular fitness. Pt is very eager to increase workloads. Will continue to work with pt to learn when it is appropriate to increase and when he should wait.      Expected Outcomes  Patient will transition to the onsite cardiac rehab program.  Pt will continue to walk daily for 30-45 minutes.          Discharge Exercise Prescription (Final Exercise Prescription Changes): Exercise Prescription Changes - 05/12/19 1101      Response to Exercise   Blood Pressure (Admit)  126/78    Blood Pressure (Exercise)  122/72    Blood Pressure (Exit)  102/60    Heart Rate (Admit)  63 bpm    Heart Rate (Exercise)  99 bpm    Heart Rate (Exit)  63 bpm    Rating of Perceived Exertion (Exercise)  11    Perceived Dyspnea (Exercise)  0    Symptoms  None    Duration  Progress to 10 minutes continuous walking  at current work load and total walking time to 30-45 min    Intensity   THRR unchanged      Progression   Progression  Continue to progress workloads to maintain intensity without signs/symptoms of physical distress.    Average METs  4.3      Resistance Training   Training Prescription  Yes    Weight  4lbs    Reps  10-15    Time  10 Minutes      Treadmill   MPH  2.9    Grade  5    Minutes  15    METs  5.22      NuStep   Level  5    SPM  95    Minutes  15    METs  3.3      Home Exercise Plan   Plans to continue exercise at  Home (comment)   Walking   Frequency  Add 3 additional days to program exercise sessions.    Initial Home Exercises Provided  03/20/19       Nutrition:  Target Goals: Understanding of nutrition guidelines, daily intake of sodium <1577m, cholesterol <2028m calories 30% from fat and 7% or less from saturated fats, daily to have 5 or more servings of fruits and vegetables.  Biometrics: Pre Biometrics - 03/20/19 1008      Pre Biometrics   Waist Circumference  42 inches    Hip Circumference  38 inches    Waist to Hip Ratio  1.11 %  Triceps Skinfold  10 mm    % Body Fat  27.4 %    Grip Strength  23.5 kg    Flexibility  12 in    Single Leg Stand  9.52 seconds        Nutrition Therapy Plan and Nutrition Goals: Nutrition Therapy & Goals - 04/23/19 1201      Nutrition Therapy   Diet  Heart Healthy      Personal Nutrition Goals   Nutrition Goal  Pt to build a healthy plate including vegetables, fruits, whole grains, and low-fat dairy products in a heart healthy meal plan.      Intervention Plan   Intervention  Nutrition handout(s) given to patient.;Prescribe, educate and counsel regarding individualized specific dietary modifications aiming towards targeted core components such as weight, hypertension, lipid management, diabetes, heart failure and other comorbidities.    Expected Outcomes  Short Term Goal: Understand basic principles of dietary content, such as calories, fat, sodium, cholesterol and nutrients.        Nutrition Assessments: Nutrition Assessments - 04/23/19 1202      MEDFICTS Scores   Pre Score  35       Nutrition Goals Re-Evaluation: Nutrition Goals Re-Evaluation    [place] Name 04/23/19 1202             Goals   Current Weight  180 lb (81.6 kg)       Nutrition Goal  Pt to build a healthy plate including vegetables, fruits, whole grains, and low-fat dairy products in a heart healthy meal plan.          Nutrition Goals Discharge (Final Nutrition Goals Re-Evaluation): Nutrition Goals Re-Evaluation - 04/23/19 1202      Goals   Current Weight  180 lb (81.6 kg)    Nutrition Goal  Pt to build a healthy plate including vegetables, fruits, whole grains, and low-fat dairy products in a heart healthy meal plan.       Psychosocial: Target Goals: Acknowledge presence or absence of significant depression and/or stress, maximize coping skills, provide positive support system. Participant is able to verbalize types and ability to use techniques and skills needed for reducing stress and depression.  Initial Review & Psychosocial Screening: Initial Psych Review & Screening - 03/20/19 0924      Initial Review   Current issues with  None Identified   patient does admit to feeling down occasionally secondary to covid restrictions and isolation     Family Dynamics   Good Support System?  Yes    Comments  Cole Martinez continurs to have a positive attitude and outlook. He continues to work out of his home which provides him with socialization virtually and via telephone. He has a supportive family and zooms with his grandchildren for dinner each Wednesday. PHQ2 score 0. No psychosocial interventions needed at this time.      Barriers   Psychosocial barriers to participate in program  There are no identifiable barriers or psychosocial needs.      Screening Interventions   Interventions  Encouraged to exercise       Quality of Life Scores: Quality of Life - 03/20/19 1100      Quality  of Life   Select  Quality of Life      Quality of Life Scores   Health/Function Pre  21.96 %    Socioeconomic Pre  27.86 %    Psych/Spiritual Pre  18.79 %    Family Pre  26.8 %  GLOBAL Pre  23.31 %      Scores of 19 and below usually indicate a poorer quality of life in these areas.  A difference of  2-3 points is a clinically meaningful difference.  A difference of 2-3 points in the total score of the Quality of Life Index has been associated with significant improvement in overall quality of life, self-image, physical symptoms, and general health in studies assessing change in quality of life.  PHQ-9: Recent Review Flowsheet Data    Depression screen [facility] 2/9 03/20/2019 11/13/2013   Decreased Interest 0 0   Down, Depressed, Hopeless 1 0   PHQ - 2 Score 1 0     Interpretation of Total Score  Total Score Depression Severity:  1-4 = Minimal depression, 5-9 = Mild depression, 10-14 = Moderate depression, 15-19 = Moderately severe depression, 20-27 = Severe depression   Psychosocial Evaluation and Intervention: Psychosocial Evaluation - 04/21/19 1634      Psychosocial Evaluation & Interventions   Interventions  Encouraged to exercise with the program and follow exercise prescription    Comments  Cole Martinez continues to deny psychosocial barriers to participation in CR and self health management. He has a positive attitude and outlook. He continues to zoom with his family and friends.    Expected Outcomes  Patient will continue to have a positive attitdue and outlook. He will utilize his support system for encouragement.    Continue Psychosocial Services   No Follow up required       Psychosocial Re-Evaluation: Psychosocial Re-Evaluation    [place] Name 04/15/19 0920 05/13/19 1027           Psychosocial Re-Evaluation   Current issues with  None Identified  None Identified      Comments  --  Cole Martinez continues to deny psychosocial barriers to self health management and  participation in CR. He continues to have a positive attitude and outlook. He utilizes his family for support and optimistic that he will be able to visit soon once his family is completely vaccinated.      Expected Outcomes  --  Cole Martinez will continue to have a positive attitude and outlook. He will voice barriers to CR staff if they arise.      Interventions  Encouraged to attend Cardiac Rehabilitation for the exercise  Encouraged to attend Cardiac Rehabilitation for the exercise      Continue Psychosocial Services   No Follow up required  No Follow up required         Psychosocial Discharge (Final Psychosocial Re-Evaluation): Psychosocial Re-Evaluation - 05/13/19 1027      Psychosocial Re-Evaluation   Current issues with  None Identified    Comments  Cole Martinez continues to deny psychosocial barriers to self health management and participation in CR. He continues to have a positive attitude and outlook. He utilizes his family for support and optimistic that he will be able to visit soon once his family is completely vaccinated.    Expected Outcomes  Cole Martinez will continue to have a positive attitude and outlook. He will voice barriers to CR staff if they arise.    Interventions  Encouraged to attend Cardiac Rehabilitation for the exercise    Continue Psychosocial Services   No Follow up required       Vocational Rehabilitation: Provide vocational rehab assistance to qualifying candidates.   Vocational Rehab Evaluation & Intervention: Vocational Rehab - 03/20/19 8563      Initial Vocational Rehab Evaluation &  Intervention   Assessment shows need for Vocational Rehabilitation  No       Education: Education Goals: Education classes will be provided on a weekly basis, covering required topics. Participant will state understanding/return demonstration of topics presented.  Learning Barriers/Preferences:   Education Topics: Hypertension, Hypertension Reduction -Define heart  disease and high blood pressure. Discus how high blood pressure affects the body and ways to reduce high blood pressure.   Exercise and Your Heart -Discuss why it is important to exercise, the FITT principles of exercise, normal and abnormal responses to exercise, and how to exercise safely.   Angina -Discuss definition of angina, causes of angina, treatment of angina, and how to decrease risk of having angina.   Cardiac Medications -Review what the following cardiac medications are used for, how they affect the body, and side effects that may occur when taking the medications.  Medications include Aspirin, Beta blockers, calcium channel blockers, ACE Inhibitors, angiotensin receptor blockers, diuretics, digoxin, and antihyperlipidemics.   Congestive Heart Failure -Discuss the definition of CHF, how to live with CHF, the signs and symptoms of CHF, and how keep track of weight and sodium intake.   Heart Disease and Intimacy -Discus the effect sexual activity has on the heart, how changes occur during intimacy as we age, and safety during sexual activity.   Smoking Cessation / COPD -Discuss different methods to quit smoking, the health benefits of quitting smoking, and the definition of COPD.   Nutrition I: Fats -Discuss the types of cholesterol, what cholesterol does to the heart, and how cholesterol levels can be controlled.   Nutrition II: Labels -Discuss the different components of food labels and how to read food label   Heart Parts/Heart Disease and PAD -Discuss the anatomy of the heart, the pathway of blood circulation through the heart, and these are affected by heart disease.   Stress I: Signs and Symptoms -Discuss the causes of stress, how stress may lead to anxiety and depression, and ways to limit stress.   Stress II: Relaxation -Discuss different types of relaxation techniques to limit stress.   Warning Signs of Stroke / TIA -Discuss definition of a stroke,  what the signs and symptoms are of a stroke, and how to identify when someone is having stroke.   Knowledge Questionnaire Score: Knowledge Questionnaire Score - 03/20/19 1100      Knowledge Questionnaire Score   Pre Score  19/24       Core Components/Risk Factors/Patient Goals at Admission: Personal Goals and Risk Factors at Admission - 03/20/19 1100      Core Components/Risk Factors/Patient Goals on Admission   Hypertension  Yes    Intervention  Provide education on lifestyle modifcations including regular physical activity/exercise, weight management, moderate sodium restriction and increased consumption of fresh fruit, vegetables, and low fat dairy, alcohol moderation, and smoking cessation.;Monitor prescription use compliance.    Expected Outcomes  Short Term: Continued assessment and intervention until BP is < 140/75m HG in hypertensive participants. < 130/850mHG in hypertensive participants with diabetes, heart failure or chronic kidney disease.;Long Term: Maintenance of blood pressure at goal levels.    Lipids  Yes    Intervention  Provide education and support for participant on nutrition & aerobic/resistive exercise along with prescribed medications to achieve LDL <702mHDL >75m73m  Expected Outcomes  Short Term: Participant states understanding of desired cholesterol values and is compliant with medications prescribed. Participant is following exercise prescription and nutrition guidelines.;Long Term: Cholesterol controlled with medications  as prescribed, with individualized exercise RX and with personalized nutrition plan. Value goals: LDL < 27m, HDL > 40 mg.       Core Components/Risk Factors/Patient Goals Review:  Goals and Risk Factor Review    Row Name 04/15/19 0921 04/21/19 1640 05/13/19 1030         Core Components/Risk Factors/Patient Goals Review   Personal Goals Review  Hypertension;Lipids  --  Hypertension;Lipids     Review  Cole Martinez doing well with exercise  using the virtual cardiac rehab better hearts APP  Cole Martinez well in his first CR in-person exercise session today. He denied cardiac symptoms and was eager to work to an RPE of 11-13.  Cole Martinez to do very well in CR. His VS remain stable and he continues to work to an RPE of 11-13. Cole Martinez he sees a significant improvement in his stamina and strength and ADLs are much easier. He has met with the RD for dietary modifications.     Expected Outcomes  Patient will continue to participate in cardiac rehab for exercise, nutrtion and lifestyle modifications  Patient will continue to participate in cardiac rehab for exercise, nutrtion and lifestyle modifications  Patient will continue to participate in cardiac rehab for exercise, nutrtion and lifestyle modifications        Core Components/Risk Factors/Patient Goals at Discharge (Final Review):  Goals and Risk Factor Review - 05/13/19 1030      Core Components/Risk Factors/Patient Goals Review   Personal Goals Review  Hypertension;Lipids    Review  Cole Martinez to do very well in CR. His VS remain stable and he continues to work to an RPE of 11-13. Cole Martinez he sees a significant improvement in his stamina and strength and ADLs are much easier. He has met with the RD for dietary modifications.    Expected Outcomes  Patient will continue to participate in cardiac rehab for exercise, nutrtion and lifestyle modifications       ITP Comments: ITP Comments    Row Name 03/20/19 0920 04/15/19 0916 04/21/19 1631 05/13/19 1025     ITP Comments  Dr. TFransico Him Medical Director [facility]  30 Day ITP Review. Cole Martinez doing well with exercise using the Virtual Better Hearts APP. Phase 2 Cardiac rehab is Resuming In person exercise on 04/21/19  Mr. SBorowiakcompleted his first in-person exercise sessions today and tolerated well. VSS. Denied complaints.  30 day ITP review: Mr. SBarniercontinues to do very well in  CR. He tolerates work load increases and is self motivated. He consistantly works to an RPE level of 11-13 and self increases workload if RPE<11. He feels his stamina and strength are improving as is his balance. He is on track to meet his personal goals at discharge. VSS       Comments: See ITP comments.MBarnet Pall RN,BSN 05/15/2019 1:51 PM

## 2019-05-16 ENCOUNTER — Encounter (HOSPITAL_COMMUNITY)
Admission: RE | Admit: 2019-05-16 | Discharge: 2019-05-16 | Disposition: A | Discharge status: Home / Self Care | Payer: Medicare Other | Source: Ambulatory Visit | Attending: Interventional Cardiology | Admitting: Interventional Cardiology

## 2019-05-16 ENCOUNTER — Other Ambulatory Visit: Payer: Self-pay

## 2019-05-16 DIAGNOSIS — Z955 Presence of coronary angioplasty implant and graft: Secondary | ICD-10-CM | POA: Diagnosis not present

## 2019-05-17 NOTE — Progress Notes (Signed)
Cardiology Office Note:    Date:  05/19/2019   ID:  Cole Martinez, DOB Apr 20, 1945, MRN [id number]  PCP:  Cole Huddle, MD  Cardiologist:  Cole Grooms, MD   Referring MD: Cole Huddle, MD   Chief Complaint  Patient presents with  . Coronary Artery Disease    History of Present Illness:    Cole Martinez is a 74 y.o. male with a hx of  remote CAD with LAD stent 1999,, hyperlipidemia, hypertension, obesity and recurrent angina leading to DES RCA 02/2019. Residual LAD ISR being treated medically.  Cole Martinez is walking every day.  Since stenting of the right coronary he has had no recurrence of angina.  He denies palpitations, dyspnea, syncope, lower extremity swelling, and claudication.  Past Medical History:  Diagnosis Date  . ADHD (attention deficit hyperactivity disorder)   . B12 deficiency   . BPH (benign prostatic hypertrophy)   . 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 Coronary artery disease CARDIOLOGIST- Cole Martinez   S/P STENTING LAD 1999 // Myoview 01/2019: EF 62, normal perfusion; Low Risk  . Crohn's disease of ileum ([place]) SINCE 1988  . ED (erectile dysfunction)   . Elevated PSA   . Essential and other specified forms of tremor 11/25/2012  . H/O adenomatous polyp of colon   . Hyperlipidemia   . Nocturia   . OA (osteoarthritis)   . Peripheral neuropathy    hx of, none current as of 08-04-13  . Peyronie disease   . S/P coronary artery stent placement OCT 1999 OF LAD  . Tremor, hereditary, benign MILD RIGHT HAND    Past Surgical History:  Procedure Laterality Date  . CARDIOVASCULAR STRESS TEST  11-01-2010 Cole Martinez   NORMAL PERFUSION STUDY/ EF 64%/ NO ISCHEMIA  . cataract Martinez  Bilateral feburary 2020   with lens placement   . COLONOSCOPY WITH PROPOFOL N/A 09/24/2012   Procedure: COLONOSCOPY WITH PROPOFOL;  Surgeon: Cole Fair, MD;   Location: [facility];  Service: Endoscopy;  Laterality: N/A;  . CORONARY ANGIOPLASTY WITH STENT PLACEMENT  OCT 1999   STENT OF LAD  . CORONARY STENT INTERVENTION N/A 02/14/2019   Procedure: CORONARY STENT INTERVENTION;  Surgeon: Cole Crome, MD;  Location: [facility];  Service: Cardiovascular;  Laterality: N/A;  . CYSTOSCOPY WITH URETHRAL DILATATION  03/12/2017   Procedure: CYSTOSCOPY WITH URETHRAL DILATATION;  Surgeon: Cole Arabian, MD;  Location: [facility];  Service: Orthopedics;;  Ammie Martinez, Resident Assisting  . CYSTOSCOPY WITH URETHRAL DILATATION N/A 10/03/2018   Procedure: CYSTOSCOPY WITH URETHRAL BALLOON DILATATION WITH BILATERAL RETROGRADE PYELOGRAPHY;  Surgeon: Cole Bring, MD;  Location: [facility];  Service: Urology;  Laterality: N/A;  . LAPAROSCOPIC INGUINAL HERNIA REPAIR Bilateral 01-10-2004   W/ MESH  . LEFT HEART CATH AND CORONARY ANGIOGRAPHY N/A 02/13/2019   Procedure: LEFT HEART CATH AND CORONARY ANGIOGRAPHY;  Surgeon: Cole Crome, MD;  Location: [facility];  Service: Cardiovascular;  Laterality: N/A;  . neck benign removed from neck  yrs ago  . PROSTATE BIOPSY N/A 07/12/2012   Procedure: PROSTATE BIOPSY AND ULTRASOUND;  Surgeon: Cole Rud, MD;  Location: [facility];  Service: Urology;  Laterality: N/A;  . PROSTATE Martinez  2002   tuna  . REMOVAL LEFT NECK LYMPH NODE  1996  . TONSILLECTOMY  CHILD  . TOTAL KNEE  ARTHROPLASTY Right 03/12/2017   Procedure: RIGHT TOTAL KNEE ARTHROPLASTY;  Surgeon: Cole Arabian, MD;  Location: [facility];  Service: Orthopedics;  Laterality: Right;  Adductor Block  . TRANSURETHRAL RESECTION OF PROSTATE N/A 08/08/2013   Procedure: TRANSURETHRAL RESECTION OF THE PROSTATE WITH GYRUS INSTRUMENTS;  Surgeon: Cole Rud, MD;  Location: [facility];  Service: Urology;  Laterality: N/A;  . TRANSURETHRAL RESECTION OF PROSTATE      Current Medications: Current Meds  Medication Sig  . acetaminophen (TYLENOL) 500  MG tablet Take 1,000 mg by mouth at bedtime as needed for mild pain (joint pain).   Marland Kitchen amLODipine (NORVASC) 2.5 MG tablet TAKE 1 TABLET BY MOUTH  DAILY  . amphetamine-dextroamphetamine (ADDERALL) 5 MG tablet Take 5 mg by mouth 2 (two) times daily.  Marland Kitchen aspirin EC 81 MG tablet Take 81 mg by mouth daily.   Marland Kitchen atorvastatin (LIPITOR) 20 MG tablet TAKE 1 TABLET BY MOUTH IN  THE MORNING  . calcium citrate (CALCITRATE - DOSED IN MG ELEMENTAL CALCIUM) 950 (200 Ca) MG tablet Take 200 mg of elemental calcium by mouth daily.  . Carboxymethylcellul-Glycerin (LUBRICATING EYE DROPS OP) Place 1 drop into both eyes daily as needed (dry eyes).  . Cholecalciferol (VITAMIN D3) 50 MCG (2000 UT) capsule Take 2,000 Units by mouth daily.   . clopidogrel (PLAVIX) 75 MG tablet Take 1 tablet (75 mg total) by mouth daily.  . Coenzyme Q10 (CO Q-10) 200 MG CAPS Take 200 mg by mouth daily.   . Cyanocobalamin (VITAMIN B-12) 2500 MCG SUBL Place 2,500 mcg under the tongue daily.  . metoprolol succinate (TOPROL-XL) 25 MG 24 hr tablet Take 1 tablet (25 mg total) by mouth daily.  . pantoprazole (PROTONIX) 40 MG tablet Take 1 tablet (40 mg total) by mouth daily.     Allergies:   Patient has no known allergies.   Social History   Socioeconomic History  . Marital status: Married    Spouse name: Not on file  . Number of children: 3  . Years of education: 16  . Highest education level: Bachelor's degree (e.g., BA, AB, BS)  Occupational History  . Not on file  Tobacco Use  . Smoking status: Former Smoker    Packs/day: 1.00    Years: 10.00    Pack years: 10.00    Types: Cigarettes    Quit date: 07/09/1984    Years since quitting: 34.8  . Smokeless tobacco: Never Used  Substance and Sexual Activity  . Alcohol use: Yes    Alcohol/week: 2.0 standard drinks    Types: 2 Standard drinks or equivalent per week    Comment: daily 1 per day ,   . Drug use: No  . Sexual activity: Not on file  Other Topics Concern  . Not on file    Social History Narrative   Lives at home with is wife, Cole Martinez.  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  Has BBA.  Has 3 children.    Caffeine 2 cups coffee.     Social Determinants of Health   Financial Resource Strain:   . Difficulty of Paying Living Expenses:   Food Insecurity: No Food Insecurity  . Worried About Charity fundraiser in the Last Year: Never true  . Ran Out of Food in the Last Year: Never true  Transportation Needs: No Transportation Needs  . Lack of Transportation (Medical): No  . Lack of Transportation (Non-Medical): No  Physical Activity: Insufficiently Active  . Days of Exercise per Week: 3  days  . Minutes of Exercise per Session: 40 min  Stress:   . Feeling of Stress :   Social Connections: Unknown  . Frequency of Communication with Friends and Family: More than three times a week  . Frequency of Social Gatherings with Friends and Family: Once a week  . Attends Religious Services: Not on file  . Active Member of Clubs or Organizations: Not on file  . Attends Archivist Meetings: Not on file  . Marital Status: Married     Family History: The patient's family history includes Diabetes in his brother; Obesity in his brother; Parkinsonism in his brother.  ROS:   Please see the history of present illness.    He inquires about whether it is safe to use Cialis.  States he has not been able to have sexual intercourse for 8 months.  He is unable to get an erection.  He has not needed sublingual nitroglycerin.  He suffers from Peyronie's disease.  All other systems reviewed and are negative.  EKGs/Labs/Other Studies Reviewed:    The following studies were reviewed today: No new cardiac data  EKG:  EKG an EKG is not repeated  Recent Labs: 04/29/2019: ALT 15; BUN 26; Creatinine, Ser 1.09; Hemoglobin 15.0; Platelets 239; Potassium 4.3; Sodium 138  Recent Lipid Panel    Component Value Date/Time   CHOL 154 12/14/2015 0806   TRIG 118 12/14/2015 0806   HDL 47  12/14/2015 0806   CHOLHDL 3.3 12/14/2015 0806   VLDL 24 12/14/2015 0806   LDLCALC 83 12/14/2015 0806    Physical Exam:    VS:  BP 128/68   Pulse 63   Ht 5' 7"  (1.702 m)   Wt 179 lb 9.6 oz (81.5 kg)   SpO2 98%   BMI 28.13 kg/m     Wt Readings from Last 3 Encounters:  05/19/19 179 lb 9.6 oz (81.5 kg)  04/29/19 178 lb (80.7 kg)  04/16/19 180 lb (81.6 kg)     GEN: Abdominal obesity and kyphosis is noted on observation.. No acute distress HEENT: Normal NECK: No JVD. LYMPHATICS: No lymphadenopathy CARDIAC:  RRR without murmur, gallop, or edema. VASCULAR:  Normal Pulses. No bruits. RESPIRATORY:  Clear to auscultation without rales, wheezing or rhonchi  ABDOMEN: Soft, non-tender, non-distended, No pulsatile mass, MUSCULOSKELETAL: No deformity  SKIN: Warm and dry NEUROLOGIC:  Alert and oriented x 3 PSYCHIATRIC:  Normal affect   ASSESSMENT:    1. Coronary artery disease involving native coronary artery of native heart with angina pectoris ([place])   2. Essential hypertension   3. Mixed hyperlipidemia   4. Erectile dysfunction, unspecified erectile dysfunction type   5. Educated about COVID-19 virus infection    PLAN:    In order of problems listed above:  1. Secondary prevention discussed.  I think it is okay for him to use Cialis.  He does not have nitroglycerin.  I urged him to remember that Cialis and nitroglycerin cannot be used within 48 hours of each other. 2. The blood pressure is well controlled.  Target 130/80 mmHg or less. 3. Target LDL is less than 70.  LDL target is being achieved with Lipitor 20 mg/day which shall be continued. 4. It is okay to use PDE 5 inhibitor therapy since he is not on nitrates.  If no help with erection, he should check with his primary care physician. 5. He has gotten the COVID-19 vaccine.  3W's is being practiced.  Overall education and awareness concerning secondary risk  prevention was discussed in detail: LDL less than 70, hemoglobin A1c  less than 7, blood pressure target less than 130/80 mmHg, >150 minutes of moderate aerobic activity per week, avoidance of smoking, weight control (via diet and exercise), and continued surveillance/management of/for obstructive sleep apnea.    Medication Adjustments/Labs and Tests Ordered: Current medicines are reviewed at length with the patient today.  Concerns regarding medicines are outlined above.  No orders of the defined types were placed in this encounter.  No orders of the defined types were placed in this encounter.   Patient Instructions  Medication Instructions:  Your physician recommends that you continue on your current medications as directed. Please refer to the Current Medication list given to you today.  *If you need a refill on your cardiac medications before your next appointment, please call your pharmacy*   Lab Work: None If you have labs (blood work) drawn today and your tests are completely normal, you will receive your results only by: Marland Kitchen MyChart Message (if you have MyChart) OR . A paper copy in the mail If you have any lab test that is abnormal or we need to change your treatment, we will call you to review the results.   Testing/Procedures: None   Follow-Up: At [facility], you and your health needs are our priority.  As part of our continuing mission to provide you with exceptional heart care, we have created designated Provider Care Teams.  These Care Teams include your primary Cardiologist (physician) and Advanced Practice Providers (APPs -  Physician Assistants and Nurse Practitioners) who all work together to provide you with the care you need, when you need it.  We recommend signing up for the patient portal called "MyChart".  Sign up information is provided on this After Visit Summary.  MyChart is used to connect with patients for Virtual Visits (Telemedicine).  Patients are able to view lab/test results, encounter notes, upcoming appointments,  etc.  Non-urgent messages can be sent to your provider as well.   To learn more about what you can do with MyChart, go to [email or web address].    Your next appointment:   6 month(s)  The format for your next appointment:   In Person  Provider:   You may see Cole Grooms, MD or one of the following Advanced Practice Providers on your designated Care Team:    Truitt Merle, NP  Cecilie Kicks, NP  Kathyrn Drown, NP    Other Instructions      Signed, Cole Grooms, MD  05/19/2019 5:27 PM    [place]

## 2019-05-19 ENCOUNTER — Other Ambulatory Visit: Payer: Self-pay

## 2019-05-19 ENCOUNTER — Encounter (HOSPITAL_COMMUNITY)
Admission: RE | Admit: 2019-05-19 | Discharge: 2019-05-19 | Disposition: A | Discharge status: Home / Self Care | Payer: Medicare Other | Source: Ambulatory Visit | Attending: Interventional Cardiology | Admitting: Interventional Cardiology

## 2019-05-19 ENCOUNTER — Ambulatory Visit (INDEPENDENT_AMBULATORY_CARE_PROVIDER_SITE_OTHER): Payer: Medicare Other | Admitting: Interventional Cardiology

## 2019-05-19 ENCOUNTER — Encounter: Payer: Self-pay | Admitting: Interventional Cardiology

## 2019-05-19 VITALS — BP 128/68 | HR 63 | Ht 67.0 in | Wt 179.6 lb

## 2019-05-19 DIAGNOSIS — Z7189 Other specified counseling: Secondary | ICD-10-CM | POA: Diagnosis not present

## 2019-05-19 DIAGNOSIS — I10 Essential (primary) hypertension: Secondary | ICD-10-CM

## 2019-05-19 DIAGNOSIS — E782 Mixed hyperlipidemia: Secondary | ICD-10-CM | POA: Diagnosis not present

## 2019-05-19 DIAGNOSIS — Z955 Presence of coronary angioplasty implant and graft: Secondary | ICD-10-CM | POA: Diagnosis not present

## 2019-05-19 DIAGNOSIS — N529 Male erectile dysfunction, unspecified: Secondary | ICD-10-CM | POA: Diagnosis not present

## 2019-05-19 DIAGNOSIS — I25119 Atherosclerotic heart disease of native coronary artery with unspecified angina pectoris: Secondary | ICD-10-CM | POA: Diagnosis not present

## 2019-05-19 NOTE — Patient Instructions (Signed)
Medication Instructions:  Your physician recommends that you continue on your current medications as directed. Please refer to the Current Medication list given to you today.  *If you need a refill on your cardiac medications before your next appointment, please call your pharmacy*   Lab Work: None If you have labs (blood work) drawn today and your tests are completely normal, you will receive your results only by: Marland Kitchen MyChart Message (if you have MyChart) OR . A paper copy in the mail If you have any lab test that is abnormal or we need to change your treatment, we will call you to review the results.   Testing/Procedures: None   Follow-Up: At [facility], you and your health needs are our priority.  As part of our continuing mission to provide you with exceptional heart care, we have created designated Provider Care Teams.  These Care Teams include your primary Cardiologist (physician) and Advanced Practice Providers (APPs -  Physician Assistants and Nurse Practitioners) who all work together to provide you with the care you need, when you need it.  We recommend signing up for the patient portal called "MyChart".  Sign up information is provided on this After Visit Summary.  MyChart is used to connect with patients for Virtual Visits (Telemedicine).  Patients are able to view lab/test results, encounter notes, upcoming appointments, etc.  Non-urgent messages can be sent to your provider as well.   To learn more about what you can do with MyChart, go to [email or web address].    Your next appointment:   6 month(s)  The format for your next appointment:   In Person  Provider:   You may see Sinclair Grooms, MD or one of the following Advanced Practice Providers on your designated Care Team:    Truitt Merle, NP  Cecilie Kicks, NP  Kathyrn Drown, NP    Other Instructions

## 2019-05-21 ENCOUNTER — Other Ambulatory Visit: Payer: Self-pay

## 2019-05-21 ENCOUNTER — Encounter (HOSPITAL_COMMUNITY)
Admission: RE | Admit: 2019-05-21 | Discharge: 2019-05-21 | Disposition: A | Discharge status: Home / Self Care | Payer: Medicare Other | Source: Ambulatory Visit | Attending: Interventional Cardiology | Admitting: Interventional Cardiology

## 2019-05-21 DIAGNOSIS — R7303 Prediabetes: Secondary | ICD-10-CM | POA: Diagnosis not present

## 2019-05-21 DIAGNOSIS — Z955 Presence of coronary angioplasty implant and graft: Secondary | ICD-10-CM

## 2019-05-21 DIAGNOSIS — R6882 Decreased libido: Secondary | ICD-10-CM | POA: Diagnosis not present

## 2019-05-21 DIAGNOSIS — E230 Hypopituitarism: Secondary | ICD-10-CM | POA: Diagnosis not present

## 2019-05-21 DIAGNOSIS — M8580 Other specified disorders of bone density and structure, unspecified site: Secondary | ICD-10-CM | POA: Diagnosis not present

## 2019-05-23 ENCOUNTER — Encounter (HOSPITAL_COMMUNITY)
Admission: RE | Admit: 2019-05-23 | Discharge: 2019-05-23 | Disposition: A | Discharge status: Home / Self Care | Payer: Medicare Other | Source: Ambulatory Visit | Attending: Interventional Cardiology | Admitting: Interventional Cardiology

## 2019-05-23 ENCOUNTER — Other Ambulatory Visit: Payer: Self-pay

## 2019-05-23 DIAGNOSIS — Z955 Presence of coronary angioplasty implant and graft: Secondary | ICD-10-CM | POA: Diagnosis not present

## 2019-05-26 ENCOUNTER — Other Ambulatory Visit: Payer: Self-pay

## 2019-05-26 ENCOUNTER — Encounter (HOSPITAL_COMMUNITY)
Admission: RE | Admit: 2019-05-26 | Discharge: 2019-05-26 | Disposition: A | Discharge status: Home / Self Care | Payer: Medicare Other | Source: Ambulatory Visit | Attending: Interventional Cardiology | Admitting: Interventional Cardiology

## 2019-05-26 DIAGNOSIS — Z955 Presence of coronary angioplasty implant and graft: Secondary | ICD-10-CM

## 2019-05-28 ENCOUNTER — Other Ambulatory Visit: Payer: Self-pay

## 2019-05-28 ENCOUNTER — Encounter (HOSPITAL_COMMUNITY)
Admission: RE | Admit: 2019-05-28 | Discharge: 2019-05-28 | Disposition: A | Discharge status: Home / Self Care | Payer: Medicare Other | Source: Ambulatory Visit | Attending: Interventional Cardiology | Admitting: Interventional Cardiology

## 2019-05-28 DIAGNOSIS — Z955 Presence of coronary angioplasty implant and graft: Secondary | ICD-10-CM | POA: Diagnosis not present

## 2019-05-30 ENCOUNTER — Other Ambulatory Visit: Payer: Self-pay

## 2019-05-30 ENCOUNTER — Encounter (HOSPITAL_COMMUNITY)
Admission: RE | Admit: 2019-05-30 | Discharge: 2019-05-30 | Disposition: A | Discharge status: Home / Self Care | Payer: Medicare Other | Source: Ambulatory Visit | Attending: Interventional Cardiology | Admitting: Interventional Cardiology

## 2019-05-30 VITALS — Ht 67.0 in | Wt 180.3 lb

## 2019-05-30 DIAGNOSIS — Z955 Presence of coronary angioplasty implant and graft: Secondary | ICD-10-CM

## 2019-06-02 ENCOUNTER — Other Ambulatory Visit: Payer: Self-pay

## 2019-06-02 ENCOUNTER — Encounter (HOSPITAL_COMMUNITY)
Admission: RE | Admit: 2019-06-02 | Discharge: 2019-06-02 | Disposition: A | Discharge status: Home / Self Care | Payer: Medicare Other | Source: Ambulatory Visit | Attending: Interventional Cardiology | Admitting: Interventional Cardiology

## 2019-06-02 DIAGNOSIS — Z955 Presence of coronary angioplasty implant and graft: Secondary | ICD-10-CM

## 2019-06-04 ENCOUNTER — Other Ambulatory Visit: Payer: Self-pay

## 2019-06-04 ENCOUNTER — Encounter (HOSPITAL_COMMUNITY)
Admission: RE | Admit: 2019-06-04 | Discharge: 2019-06-04 | Disposition: A | Discharge status: Home / Self Care | Payer: Medicare Other | Source: Ambulatory Visit | Attending: Interventional Cardiology | Admitting: Interventional Cardiology

## 2019-06-04 DIAGNOSIS — Z955 Presence of coronary angioplasty implant and graft: Secondary | ICD-10-CM | POA: Diagnosis not present

## 2019-06-06 ENCOUNTER — Encounter (HOSPITAL_COMMUNITY)
Admission: RE | Admit: 2019-06-06 | Discharge: 2019-06-06 | Disposition: A | Discharge status: Home / Self Care | Payer: Medicare Other | Source: Ambulatory Visit | Attending: Interventional Cardiology | Admitting: Interventional Cardiology

## 2019-06-06 ENCOUNTER — Other Ambulatory Visit: Payer: Self-pay

## 2019-06-06 DIAGNOSIS — Z955 Presence of coronary angioplasty implant and graft: Secondary | ICD-10-CM | POA: Insufficient documentation

## 2019-06-09 ENCOUNTER — Other Ambulatory Visit: Payer: Self-pay

## 2019-06-09 ENCOUNTER — Encounter (HOSPITAL_COMMUNITY)
Admission: RE | Admit: 2019-06-09 | Discharge: 2019-06-09 | Disposition: A | Discharge status: Home / Self Care | Payer: Medicare Other | Source: Ambulatory Visit | Attending: Interventional Cardiology | Admitting: Interventional Cardiology

## 2019-06-09 DIAGNOSIS — Z955 Presence of coronary angioplasty implant and graft: Secondary | ICD-10-CM

## 2019-06-11 ENCOUNTER — Other Ambulatory Visit: Payer: Self-pay

## 2019-06-11 ENCOUNTER — Encounter (HOSPITAL_COMMUNITY)
Admission: RE | Admit: 2019-06-11 | Discharge: 2019-06-11 | Disposition: A | Discharge status: Home / Self Care | Payer: Medicare Other | Source: Ambulatory Visit | Attending: Interventional Cardiology | Admitting: Interventional Cardiology

## 2019-06-11 DIAGNOSIS — Z955 Presence of coronary angioplasty implant and graft: Secondary | ICD-10-CM | POA: Diagnosis not present

## 2019-06-11 NOTE — Progress Notes (Signed)
Cardiac Individual Treatment Plan  Patient Details  Name: Cole Martinez MRN: [id number] Date of Birth: Jun 23, 1945 Referring Provider:     CARDIAC REHAB PHASE II ORIENTATION from 03/20/2019 in [place]  Referring Provider  Belva Crome, MD      Initial Encounter Date:    CARDIAC REHAB PHASE II ORIENTATION from 03/20/2019 in [place]  Date  03/20/19      Visit Diagnosis: Status post coronary artery stent placement  Patient's Home Medications on Admission:  Current Outpatient Medications:  .  acetaminophen (TYLENOL) 500 MG tablet, Take 1,000 mg by mouth at bedtime as needed for mild pain (joint pain). , Disp: , Rfl:  .  amLODipine (NORVASC) 2.5 MG tablet, TAKE 1 TABLET BY MOUTH  DAILY, Disp: 90 tablet, Rfl: 3 .  amphetamine-dextroamphetamine (ADDERALL) 5 MG tablet, Take 5 mg by mouth 2 (two) times daily., Disp: , Rfl:  .  aspirin EC 81 MG tablet, Take 81 mg by mouth daily. , Disp: , Rfl:  .  atorvastatin (LIPITOR) 20 MG tablet, TAKE 1 TABLET BY MOUTH IN  THE MORNING, Disp: 90 tablet, Rfl: 3 .  calcium citrate (CALCITRATE - DOSED IN MG ELEMENTAL CALCIUM) 950 (200 Ca) MG tablet, Take 200 mg of elemental calcium by mouth daily., Disp: , Rfl:  .  Carboxymethylcellul-Glycerin (LUBRICATING EYE DROPS OP), Place 1 drop into both eyes daily as needed (dry eyes)., Disp: , Rfl:  .  Cholecalciferol (VITAMIN D3) 50 MCG (2000 UT) capsule, Take 2,000 Units by mouth daily. , Disp: , Rfl:  .  clopidogrel (PLAVIX) 75 MG tablet, Take 1 tablet (75 mg total) by mouth daily., Disp: 90 tablet, Rfl: 3 .  Coenzyme Q10 (CO Q-10) 200 MG CAPS, Take 200 mg by mouth daily. , Disp: , Rfl:  .  Cyanocobalamin (VITAMIN B-12) 2500 MCG SUBL, Place 2,500 mcg under the tongue daily., Disp: , Rfl:  .  metoprolol succinate (TOPROL-XL) 25 MG 24 hr tablet, Take 1 tablet (25 mg total) by mouth daily., Disp: 90 tablet, Rfl: 3 .  pantoprazole (PROTONIX) 40 MG  tablet, Take 1 tablet (40 mg total) by mouth daily., Disp: 90 tablet, Rfl: 3  Past Medical History: Past Medical History:  Diagnosis Date  . ADHD (attention deficit hyperactivity disorder)   . B12 deficiency   . BPH (benign prostatic hypertrophy)   . 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 Coronary artery disease CARDIOLOGIST- DR Daneen Schick   S/P STENTING LAD 1999 // Myoview 01/2019: EF 62, normal perfusion; Low Risk  . Crohn's disease of ileum ([place]) SINCE 1988  . ED (erectile dysfunction)   . Elevated PSA   . Essential and other specified forms of tremor 11/25/2012  . H/O adenomatous polyp of colon   . Hyperlipidemia   . Nocturia   . OA (osteoarthritis)   . Peripheral neuropathy    hx of, none current as of 08-04-13  . Peyronie disease   . S/P coronary artery stent placement OCT 1999 OF LAD  . Tremor, hereditary, benign MILD RIGHT HAND    Tobacco Use: Social History   Tobacco Use  Smoking Status Former Smoker  . Packs/day: 1.00  . Years: 10.00  . Pack years: 10.00  . Types: Cigarettes  . Quit date: 07/09/1984  . Years since quitting: 34.9  Smokeless Tobacco Never Used  Labs: Recent Review Flowsheet Data    Labs for ITP Cardiac and Pulmonary Rehab Latest Ref Rng & Units 05/13/2010 10/20/2013 10/22/2014 12/14/2015   Cholestrol 125 - 200 mg/dL - 134 138 154   LDLCALC <130 mg/dL - 70 74 83   HDL >=40 mg/dL - 45.30 42.50 47   Trlycerides <150 mg/dL - 93.0 111.0 118   TCO2 0 - 100 mmol/L 22 - - -      Capillary Blood Glucose: No results found for: GLUCAP   Exercise Target Goals: Exercise Program Goal: Individual exercise prescription set using results from initial 6 min walk test and THRR while considering  patient's activity barriers and safety.   Exercise Prescription Goal: Starting with aerobic activity 30 plus minutes a day, 3 days per week for initial  exercise prescription. Provide home exercise prescription and guidelines that participant acknowledges understanding prior to discharge.  Activity Barriers & Risk Stratification: Activity Barriers & Cardiac Risk Stratification - 03/20/19 0942      Activity Barriers & Cardiac Risk Stratification   Activity Barriers  Arthritis;Right Knee Replacement;Balance Concerns    Cardiac Risk Stratification  Moderate       6 Minute Walk: 6 Minute Walk    Row Name 03/20/19 1004 05/30/19 1333       6 Minute Walk   Phase  Initial  Discharge    Distance  1614 feet  1650 feet    Distance % Change  --  2.23 %    Distance Feet Change  --  36 ft    Walk Time  6 minutes  6 minutes    # of Rest Breaks  0  0    MPH  3.06  3.1    METS  3.15  3.3    RPE  12  10    Perceived Dyspnea   0  0    VO2 Peak  11.02  11.4    Symptoms  No  No    Resting HR  64 bpm  82 bpm    Resting BP  114/70  122/72    Resting Oxygen Saturation   100 %  --    Exercise Oxygen Saturation  during 6 min walk  100 %  --    Max Ex. HR  89 bpm  95 bpm    Max Ex. BP  142/82  138/70    2 Minute Post BP  128/82  118/60       Oxygen Initial Assessment:   Oxygen Re-Evaluation:   Oxygen Discharge (Final Oxygen Re-Evaluation):   Initial Exercise Prescription: Initial Exercise Prescription - 03/20/19 1100      Date of Initial Exercise RX and Referring Provider   Date  03/20/19    Referring Provider  Belva Crome, MD      Track   Minutes  60      Prescription Details   Frequency (times per week)  6-7    Duration  Progress to 50 minutes of aerobic without signs/symptoms of physical distress   Patient accumulates 60 -60.25 minutes of walking     Intensity   THRR 40-80% of Max Heartrate  58-117    Ratings of Perceived Exertion  11-13    Perceived Dyspnea  0-4      Progression   Progression  Continue to progress workloads to maintain intensity without signs/symptoms of physical distress.      Resistance Training    Training Prescription  Yes    Weight  3-5lbs  Reps  10-15       Perform Capillary Blood Glucose checks as needed.  Exercise Prescription Changes:  Exercise Prescription Changes    Row Name 04/13/19 1218 04/21/19 1300 05/02/19 1105 05/12/19 1101 05/23/19 1436     Response to Exercise   Blood Pressure (Admit)  --  124/64  112/62  126/78  108/60   Blood Pressure (Exercise)  --  160/70  126/68  122/72  132/64   Blood Pressure (Exit)  --  108/62  110/60  102/60  104/66   Heart Rate (Admit)  76 bpm  69 bpm  69 bpm  63 bpm  77 bpm   Heart Rate (Exercise)  83 bpm  92 bpm  100 bpm  99 bpm  102 bpm   Heart Rate (Exit)  --  70 bpm  74 bpm  63 bpm  71 bpm   Rating of Perceived Exertion (Exercise)  _0 Perceived Dyspnea (Exercise)  --  0  0  0  0   Symptoms  none  None  None  None  None   Comments  Virtual cardiac rehab session  Pt's first day of exercise   None  --  None   Duration  Continue with 45 min of aerobic exercise without signs/symptoms of physical distress.  Progress to 10 minutes continuous walking  at current work load and total walking time to 30-45 min  Progress to 10 minutes continuous walking  at current work load and total walking time to 30-45 min  Progress to 10 minutes continuous walking  at current work load and total walking time to 30-45 min  Progress to 30 minutes of  aerobic without signs/symptoms of physical distress   Intensity  THRR unchanged  THRR unchanged  THRR unchanged  THRR unchanged  THRR unchanged     Progression   Progression  Continue to progress workloads to maintain intensity without signs/symptoms of physical distress.  Continue to progress workloads to maintain intensity without signs/symptoms of physical distress.  Continue to progress workloads to maintain intensity without signs/symptoms of physical distress.  Continue to progress workloads to maintain intensity without signs/symptoms of physical distress.  Continue to progress  workloads to maintain intensity without signs/symptoms of physical distress.   Average METs  --  2.8  3.5  4.3  4.3     Resistance Training   Training Prescription  --  Yes  Yes  Yes  Yes   Weight  --  3lbs  3lbs  4lbs  4lbs   Reps  --  10-15  10-15  10-15  10-15   Time  --  10 Minutes  10 Minutes  10 Minutes  10 Minutes     Treadmill   MPH  --  2.3  2.7  2.9  3   Grade  --  0  _1 Minutes  --  _2 METs  --  2.76  4.19  5.22  5.78     NuStep   Level  --  _3 SPM  --  75  85  95  95   Minutes  --  _4 METs  --  2.8  2.8  3.3  3.5     Track   Minutes  48 2.76 mi, 6444 steps  --  --  --  --  Home Exercise Plan   Plans to continue exercise at  -- walking  --  Home (comment) Walking  Home (comment) Walking  Home (comment) Walking   Frequency  --  --  Add 3 additional days to program exercise sessions.  Add 3 additional days to program exercise sessions.  Add 3 additional days to program exercise sessions.   Initial Home Exercises Provided  03/20/19  --  03/20/19  03/20/19  03/20/19   Row Name 06/06/19 1434             Response to Exercise   Blood Pressure (Admit)  122/74       Blood Pressure (Exercise)  162/76       Blood Pressure (Exit)  114/60       Heart Rate (Admit)  77 bpm       Heart Rate (Exercise)  104 bpm       Heart Rate (Exit)  73 bpm       Rating of Perceived Exertion (Exercise)  12       Perceived Dyspnea (Exercise)  0       Symptoms  None       Comments  Pt tolerated exercise well       Duration  Continue with 30 min of aerobic exercise without signs/symptoms of physical distress.       Intensity  THRR unchanged         Progression   Progression  Continue to progress workloads to maintain intensity without signs/symptoms of physical distress.       Average METs  4.7         Resistance Training   Training Prescription  Yes       Weight  4lbs       Reps  10-15       Time  10 Minutes         Treadmill   MPH   3       Grade  6       Minutes  15       METs  5.78         NuStep   Level  6       SPM  95       Minutes  15       METs  3.6         Home Exercise Plan   Plans to continue exercise at  Home (comment) Walking       Frequency  Add 3 additional days to program exercise sessions.       Initial Home Exercises Provided  03/20/19          Exercise Comments:  Exercise Comments    Row Name 03/20/19 0940 04/14/19 1223 04/21/19 1344 05/15/19 1111 06/12/19 1438   Exercise Comments  Reviewed home exercise guidelines with paitent.  Patient will be contacted to begin participation in the onsite cardiac rehab program after temporary closure due to COVID-19 pandemic.  Pt's first day of exercise. Pt responded well to exercise prescription. Will continue to monitor pt.  Pt is responding well to exercise prescription. Pt does have habit of self increasing workloads on treadmill during every session. Will follow up with pt regarding gradual progression.  Pt is continuing to respond well to his exercise workloads and tolerating increases well. Pt states he is considering going to local gym for exercise after rehab. Will follow up with pt.      Exercise Goals and Review:  Exercise Goals    Row Name 03/20/19 0942             Exercise Goals   Increase Physical Activity  Yes       Intervention  Provide advice, education, support and counseling about physical activity/exercise needs.;Develop an individualized exercise prescription for aerobic and resistive training based on initial evaluation findings, risk stratification, comorbidities and participant's personal goals.       Expected Outcomes  Short Term: Attend rehab on a regular basis to increase amount of physical activity.;Long Term: Exercising regularly at least 3-5 days a week.;Long Term: Add in home exercise to make exercise part of routine and to increase amount of physical activity.       Increase Strength and Stamina  Yes       Intervention   Provide advice, education, support and counseling about physical activity/exercise needs.;Develop an individualized exercise prescription for aerobic and resistive training based on initial evaluation findings, risk stratification, comorbidities and participant's personal goals.       Expected Outcomes  Short Term: Increase workloads from initial exercise prescription for resistance, speed, and METs.;Short Term: Perform resistance training exercises routinely during rehab and add in resistance training at home;Long Term: Improve cardiorespiratory fitness, muscular endurance and strength as measured by increased METs and functional capacity (6MWT)       Able to understand and use rate of perceived exertion (RPE) scale  Yes       Intervention  Provide education and explanation on how to use RPE scale       Expected Outcomes  Short Term: Able to use RPE daily in rehab to express subjective intensity level;Long Term:  Able to use RPE to guide intensity level when exercising independently       Knowledge and understanding of Target Heart Rate Range (THRR)  Yes       Intervention  Provide education and explanation of THRR including how the numbers were predicted and where they are located for reference       Expected Outcomes  Short Term: Able to state/look up THRR;Long Term: Able to use THRR to govern intensity when exercising independently;Short Term: Able to use daily as guideline for intensity in rehab       Able to check pulse independently  Yes       Intervention  Provide education and demonstration on how to check pulse in carotid and radial arteries.;Review the importance of being able to check your own pulse for safety during independent exercise       Expected Outcomes  Short Term: Able to explain why pulse checking is important during independent exercise;Long Term: Able to check pulse independently and accurately       Understanding of Exercise Prescription  Yes       Intervention  Provide  education, explanation, and written materials on patient's individual exercise prescription       Expected Outcomes  Short Term: Able to explain program exercise prescription;Long Term: Able to explain home exercise prescription to exercise independently          Exercise Goals Re-Evaluation : Exercise Goals Re-Evaluation    Row Name 04/14/19 1221 05/15/19 1112 06/12/19 1437         Exercise Goal Re-Evaluation   Exercise Goals Review  --  Increase Strength and Stamina;Increase Physical Activity;Able to understand and use rate of perceived exertion (RPE) scale;Knowledge and understanding of Target Heart Rate Range (THRR);Able to check pulse independently;Understanding of Exercise Prescription  Increase Strength and  Stamina;Increase Physical Activity;Able to understand and use rate of perceived exertion (RPE) scale;Knowledge and understanding of Target Heart Rate Range (THRR);Understanding of Exercise Prescription;Able to check pulse independently     Comments  Patient has been actively participating in the virtual cardiac rehab program via the [employer] app and has been progressing well. Paitent is walking 35-60 minutes, 3-6 days/week without issue. Patient is eager to begin the onsite cardiac rehab program.  Pt is continuing to increase his cardiovascular fitness. Pt is very eager to increase workloads. Will continue to work with pt to learn when it is appropriate to increase and when he should wait.  Pt is continuing to put forth great effort with exercise. Pt thoroughly enjoys rehab. Pt will be graduating rehab soon and is confident he can continue to exercise on his own.     Expected Outcomes  Patient will transition to the onsite cardiac rehab program.  Pt will continue to walk daily for 30-45 minutes.  Pt will continue to increase their cardiovascular fitness and stamina. Will continue to monitor.         Discharge Exercise Prescription (Final Exercise Prescription Changes): Exercise  Prescription Changes - 06/06/19 1434      Response to Exercise   Blood Pressure (Admit)  122/74    Blood Pressure (Exercise)  162/76    Blood Pressure (Exit)  114/60    Heart Rate (Admit)  77 bpm    Heart Rate (Exercise)  104 bpm    Heart Rate (Exit)  73 bpm    Rating of Perceived Exertion (Exercise)  12    Perceived Dyspnea (Exercise)  0    Symptoms  None    Comments  Pt tolerated exercise well    Duration  Continue with 30 min of aerobic exercise without signs/symptoms of physical distress.    Intensity  THRR unchanged      Progression   Progression  Continue to progress workloads to maintain intensity without signs/symptoms of physical distress.    Average METs  4.7      Resistance Training   Training Prescription  Yes    Weight  4lbs    Reps  10-15    Time  10 Minutes      Treadmill   MPH  3    Grade  6    Minutes  15    METs  5.78      NuStep   Level  6    SPM  95    Minutes  15    METs  3.6      Home Exercise Plan   Plans to continue exercise at  Home (comment)   Walking   Frequency  Add 3 additional days to program exercise sessions.    Initial Home Exercises Provided  03/20/19       Nutrition:  Target Goals: Understanding of nutrition guidelines, daily intake of sodium <1547m, cholesterol <2026m calories 30% from fat and 7% or less from saturated fats, daily to have 5 or more servings of fruits and vegetables.  Biometrics: Pre Biometrics - 03/20/19 1008      Pre Biometrics   Waist Circumference  42 inches    Hip Circumference  38 inches    Waist to Hip Ratio  1.11 %    Triceps Skinfold  10 mm    % Body Fat  27.4 %    Grip Strength  23.5 kg    Flexibility  12 in    Single Leg Stand  9.52 seconds      Post Biometrics - 05/30/19 1334       Post  Biometrics   Height  _0  (1.702 m)    Weight  81.8 kg    Waist Circumference  41 inches    Hip Circumference  39 inches    Waist to Hip Ratio  1.05 %    BMI (Calculated)  28.24    Triceps  Skinfold  10 mm    % Body Fat  26.9 %    Grip Strength  27 kg    Flexibility  16 in    Single Leg Stand  10.05 seconds       Nutrition Therapy Plan and Nutrition Goals: Nutrition Therapy & Goals - 04/23/19 1201      Nutrition Therapy   Diet  Heart Healthy      Personal Nutrition Goals   Nutrition Goal  Pt to build a healthy plate including vegetables, fruits, whole grains, and low-fat dairy products in a heart healthy meal plan.      Intervention Plan   Intervention  Nutrition handout(s) given to patient.;Prescribe, educate and counsel regarding individualized specific dietary modifications aiming towards targeted core components such as weight, hypertension, lipid management, diabetes, heart failure and other comorbidities.    Expected Outcomes  Short Term Goal: Understand basic principles of dietary content, such as calories, fat, sodium, cholesterol and nutrients.       Nutrition Assessments: Nutrition Assessments - 06/10/19 1332      MEDFICTS Scores   Post Score  18       Nutrition Goals Re-Evaluation: Nutrition Goals Re-Evaluation    Row Name 04/23/19 1202 06/10/19 1333           Goals   Current Weight  180 lb (81.6 kg)  180 lb (81.6 kg)      Nutrition Goal  Pt to build a healthy plate including vegetables, fruits, whole grains, and low-fat dairy products in a heart healthy meal plan.  Pt to build a healthy plate including vegetables, fruits, whole grains, and low-fat dairy products in a heart healthy meal plan.      Expected Outcome  --  Goal - met         Nutrition Goals Discharge (Final Nutrition Goals Re-Evaluation): Nutrition Goals Re-Evaluation - 06/10/19 1333      Goals   Current Weight  180 lb (81.6 kg)    Nutrition Goal  Pt to build a healthy plate including vegetables, fruits, whole grains, and low-fat dairy products in a heart healthy meal plan.    Expected Outcome  Goal - met       Psychosocial: Target Goals: Acknowledge presence or absence of  significant depression and/or stress, maximize coping skills, provide positive support system. Participant is able to verbalize types and ability to use techniques and skills needed for reducing stress and depression.  Initial Review & Psychosocial Screening: Initial Psych Review & Screening - 03/20/19 0924      Initial Review   Current issues with  None Identified   patient does admit to feeling down occasionally secondary to covid restrictions and isolation     Family Dynamics   Good Support System?  Yes    Comments  Mr. Chaves continurs to have a positive attitude and outlook. He continues to work out of his home which provides him with socialization virtually and via telephone. He has a supportive family and zooms with his grandchildren for dinner each Wednesday. PHQ2 score 0.  No psychosocial interventions needed at this time.      Barriers   Psychosocial barriers to participate in program  There are no identifiable barriers or psychosocial needs.      Screening Interventions   Interventions  Encouraged to exercise       Quality of Life Scores: Quality of Life - 06/05/19 0658      Quality of Life   Select  Quality of Life      Quality of Life Scores   Health/Function Pre  21.96 %    Health/Function Post  27.79 %    Health/Function % Change  26.55 %    Socioeconomic Pre  27.86 %    Socioeconomic Post  30 %    Socioeconomic % Change   7.68 %    Psych/Spiritual Pre  18.79 %    Psych/Spiritual Post  29.14 %    Psych/Spiritual % Change  55.08 %    Family Pre  26.8 %    Family Post  30 %    Family % Change  11.94 %    GLOBAL Pre  23.31 %    GLOBAL Post  28.88 %    GLOBAL % Change  23.9 %      Scores of 19 and below usually indicate a poorer quality of life in these areas.  A difference of  2-3 points is a clinically meaningful difference.  A difference of 2-3 points in the total score of the Quality of Life Index has been associated with significant improvement in overall  quality of life, self-image, physical symptoms, and general health in studies assessing change in quality of life.  PHQ-9: Recent Review Flowsheet Data    Depression screen [facility] 2/9 06/11/2019 06/11/2019 03/20/2019 11/13/2013   Decreased Interest 0 0 0 0   Down, Depressed, Hopeless 0 0 1 0   PHQ - 2 Score 0 0 1 0     Interpretation of Total Score  Total Score Depression Severity:  1-4 = Minimal depression, 5-9 = Mild depression, 10-14 = Moderate depression, 15-19 = Moderately severe depression, 20-27 = Severe depression   Psychosocial Evaluation and Intervention: Psychosocial Evaluation - 04/21/19 1634      Psychosocial Evaluation & Interventions   Interventions  Encouraged to exercise with the program and follow exercise prescription    Comments  Mr. Dennie continues to deny psychosocial barriers to participation in CR and self health management. He has a positive attitude and outlook. He continues to zoom with his family and friends.    Expected Outcomes  Patient will continue to have a positive attitdue and outlook. He will utilize his support system for encouragement.    Continue Psychosocial Services   No Follow up required       Psychosocial Re-Evaluation: Psychosocial Re-Evaluation    [place] Name 04/15/19 0920 05/13/19 1027 06/11/19 0834         Psychosocial Re-Evaluation   Current issues with  None Identified  None Identified  None Identified     Comments  --  Mr. Jasmin continues to deny psychosocial barriers to self health management and participation in CR. He continues to have a positive attitude and outlook. He utilizes his family for support and optimistic that he will be able to visit soon once his family is completely vaccinated.  Mr. Viney continues to deny psychosocial barriers to self health management and participation in CR. He continues to have a positive attitude and outlook. Mikki Santee graduates on 06/13/19  Expected Outcomes  --  Mr. Natzke will continue to have a positive  attitude and outlook. He will voice barriers to CR staff if they arise.  Mr. Schiller will continue to have a positive attitude and outlook. He will voice barriers to CR staff if they arise.     Interventions  Encouraged to attend Cardiac Rehabilitation for the exercise  Encouraged to attend Cardiac Rehabilitation for the exercise  Encouraged to attend Cardiac Rehabilitation for the exercise     Continue Psychosocial Services   No Follow up required  No Follow up required  No Follow up required        Psychosocial Discharge (Final Psychosocial Re-Evaluation): Psychosocial Re-Evaluation - 06/11/19 0834      Psychosocial Re-Evaluation   Current issues with  None Identified    Comments  Mr. Wing continues to deny psychosocial barriers to self health management and participation in CR. He continues to have a positive attitude and outlook. [employer]ki Santee graduates on 06/13/19    Expected Outcomes  Mr. Paredez will continue to have a positive attitude and outlook. He will voice barriers to CR staff if they arise.    Interventions  Encouraged to attend Cardiac Rehabilitation for the exercise    Continue Psychosocial Services   No Follow up required       Vocational Rehabilitation: Provide vocational rehab assistance to qualifying candidates.   Vocational Rehab Evaluation & Intervention: Vocational Rehab - 03/20/19 7494      Initial Vocational Rehab Evaluation & Intervention   Assessment shows need for Vocational Rehabilitation  No       Education: Education Goals: Education classes will be provided on a weekly basis, covering required topics. Participant will state understanding/return demonstration of topics presented.  Learning Barriers/Preferences:   Education Topics: Hypertension, Hypertension Reduction -Define heart disease and high blood pressure. Discus how high blood pressure affects the body and ways to reduce high blood pressure.   Exercise and Your Heart -Discuss why it is important to  exercise, the FITT principles of exercise, normal and abnormal responses to exercise, and how to exercise safely.   Angina -Discuss definition of angina, causes of angina, treatment of angina, and how to decrease risk of having angina.   Cardiac Medications -Review what the following cardiac medications are used for, how they affect the body, and side effects that may occur when taking the medications.  Medications include Aspirin, Beta blockers, calcium channel blockers, ACE Inhibitors, angiotensin receptor blockers, diuretics, digoxin, and antihyperlipidemics.   Congestive Heart Failure -Discuss the definition of CHF, how to live with CHF, the signs and symptoms of CHF, and how keep track of weight and sodium intake.   Heart Disease and Intimacy -Discus the effect sexual activity has on the heart, how changes occur during intimacy as we age, and safety during sexual activity.   Smoking Cessation / COPD -Discuss different methods to quit smoking, the health benefits of quitting smoking, and the definition of COPD.   Nutrition I: Fats -Discuss the types of cholesterol, what cholesterol does to the heart, and how cholesterol levels can be controlled.   Nutrition II: Labels -Discuss the different components of food labels and how to read food label   Heart Parts/Heart Disease and PAD -Discuss the anatomy of the heart, the pathway of blood circulation through the heart, and these are affected by heart disease.   Stress I: Signs and Symptoms -Discuss the causes of stress, how stress may lead to anxiety and depression, and ways to  limit stress.   Stress II: Relaxation -Discuss different types of relaxation techniques to limit stress.   Warning Signs of Stroke / TIA -Discuss definition of a stroke, what the signs and symptoms are of a stroke, and how to identify when someone is having stroke.   Knowledge Questionnaire Score: Knowledge Questionnaire Score - 06/06/19 1402       Knowledge Questionnaire Score   Pre Score  19/24    Post Score  20/24       Core Components/Risk Factors/Patient Goals at Admission: Personal Goals and Risk Factors at Admission - 03/20/19 1100      Core Components/Risk Factors/Patient Goals on Admission   Hypertension  Yes    Intervention  Provide education on lifestyle modifcations including regular physical activity/exercise, weight management, moderate sodium restriction and increased consumption of fresh fruit, vegetables, and low fat dairy, alcohol moderation, and smoking cessation.;Monitor prescription use compliance.    Expected Outcomes  Short Term: Continued assessment and intervention until BP is < 140/58m HG in hypertensive participants. < 130/862mHG in hypertensive participants with diabetes, heart failure or chronic kidney disease.;Long Term: Maintenance of blood pressure at goal levels.    Lipids  Yes    Intervention  Provide education and support for participant on nutrition & aerobic/resistive exercise along with prescribed medications to achieve LDL <7032mHDL >22m42m  Expected Outcomes  Short Term: Participant states understanding of desired cholesterol values and is compliant with medications prescribed. Participant is following exercise prescription and nutrition guidelines.;Long Term: Cholesterol controlled with medications as prescribed, with individualized exercise RX and with personalized nutrition plan. Value goals: LDL < 70mg24mL > 40 mg.       Core Components/Risk Factors/Patient Goals Review:  Goals and Risk Factor Review    Row Name 04/15/19 0921 65535/21 1640 05/13/19 1030 06/11/19 0837       Core Components/Risk Factors/Patient Goals Review   Personal Goals Review  Hypertension;Lipids  --  Hypertension;Lipids  Hypertension;Lipids    Review  Bob iMikki Santeeoing well with exercise using the virtual cardiac rehab better hearts APP  Mr. ShumaCousewell in his first CR in-person exercise session today. He denied  cardiac symptoms and was eager to work to an RPE of 11-13.  Mr. ShumaMelvininues to do very well in CR. His VS remain stable and he continues to work to an RPE of 11-13. Mr. ShumaRumblees he sees a significant improvement in his stamina and strength and ADLs are much easier. He has met with the RD for dietary modifications.  Mr. ShumaHoppinginues to do very well in CR. His VS remain stable and he continues to work to an RPE of 11-13.    Expected Outcomes  Patient will continue to participate in cardiac rehab for exercise, nutrtion and lifestyle modifications  Patient will continue to participate in cardiac rehab for exercise, nutrtion and lifestyle modifications  Patient will continue to participate in cardiac rehab for exercise, nutrtion and lifestyle modifications  Bob wMikki Santee continue to exercise at home upon discharge from phase 2 cardiac rehab follow nutrtion and lifestyle modifications       Core Components/Risk Factors/Patient Goals at Discharge (Final Review):  Goals and Risk Factor Review - 06/11/19 0837      Core Components/Risk Factors/Patient Goals Review   Personal Goals Review  Hypertension;Lipids    Review  Mr. ShumaPickneyinues to do very well in CR. His VS remain stable and he continues to work to an RPE of 11-13.  Expected Outcomes  Mikki Santee will continue to exercise at home upon discharge from phase 2 cardiac rehab follow nutrtion and lifestyle modifications       ITP Comments: ITP Comments    Row Name 03/20/19 0920 04/15/19 0916 04/21/19 1631 05/13/19 1025 06/11/19 1415   ITP Comments  Dr. Fransico Him, Medical Director [facility]  30 Day ITP Review. Mikki Santee is doing well with exercise using the Virtual [facility] APP. Phase 2 Cardiac rehab is Resuming In person exercise on 04/21/19  Mr. Liz completed his first in-person exercise sessions today and tolerated well. VSS. Denied complaints.  30 day ITP review: Mr. Vanblarcom continues to do very well in CR. He tolerates work  load increases and is self motivated. He consistantly works to an RPE level of 11-13 and self increases workload if RPE<11. He feels his stamina and strength are improving as is his balance. He is on track to meet his personal goals at discharge. VSS  30 Day ITP Review. Patiet with good participation and attendance in phase 2 cardiac rehab. Mikki Santee graduates from cardiac rehab on Friday.      Comments: See ITP Review.Barnet Pall, RN,BSN 06/12/2019 4:49 PM

## 2019-06-13 ENCOUNTER — Other Ambulatory Visit: Payer: Self-pay

## 2019-06-13 ENCOUNTER — Encounter (HOSPITAL_COMMUNITY)
Admission: RE | Admit: 2019-06-13 | Discharge: 2019-06-13 | Disposition: A | Discharge status: Home / Self Care | Payer: Medicare Other | Source: Ambulatory Visit | Attending: Interventional Cardiology | Admitting: Interventional Cardiology

## 2019-06-13 DIAGNOSIS — Z955 Presence of coronary angioplasty implant and graft: Secondary | ICD-10-CM | POA: Diagnosis not present

## 2019-06-16 DIAGNOSIS — F900 Attention-deficit hyperactivity disorder, predominantly inattentive type: Secondary | ICD-10-CM | POA: Diagnosis not present

## 2019-06-16 DIAGNOSIS — E291 Testicular hypofunction: Secondary | ICD-10-CM | POA: Diagnosis not present

## 2019-07-04 NOTE — Progress Notes (Signed)
Discharge Progress Report  Patient Details  Name: Cole Martinez MRN: [id number] Date of Birth: 09/26/1945 Referring Provider:     CARDIAC REHAB PHASE II ORIENTATION from 03/20/2019 in [facility]  Referring Provider  Cole Crome, MD       Number of Visits: 24  Reason for Discharge:  Patient reached a stable level of exercise. Patient independent in their exercise. Patient has met program and personal goals.  Smoking History:  Social History   Tobacco Use  Smoking Status Former Smoker  . Packs/day: 1.00  . Years: 10.00  . Pack years: 10.00  . Types: Cigarettes  . Quit date: 07/09/1984  . Years since quitting: 35.0  Smokeless Tobacco Never Used    Diagnosis:  Status post coronary artery stent placement  ADL UCSD:   Initial Exercise Prescription: Initial Exercise Prescription - 03/20/19 1100      Date of Initial Exercise RX and Referring Provider   Date  03/20/19    Referring Provider  Cole Crome, MD      Track   Minutes  60      Prescription Details   Frequency (times per week)  6-7    Duration  Progress to 50 minutes of aerobic without signs/symptoms of physical distress   Patient accumulates 60 -60.25 minutes of walking     Intensity   THRR 40-80% of Max Heartrate  58-117    Ratings of Perceived Exertion  11-13    Perceived Dyspnea  0-4      Progression   Progression  Continue to progress workloads to maintain intensity without signs/symptoms of physical distress.      Resistance Training   Training Prescription  Yes    Weight  3-5lbs    Reps  10-15       Discharge Exercise Prescription (Final Exercise Prescription Changes): Exercise Prescription Changes - 06/13/19 0800      Response to Exercise   Blood Pressure (Admit)  128/68    Blood Pressure (Exercise)  128/78    Blood Pressure (Exit)  114/78    Heart Rate (Admit)  75 bpm    Heart Rate (Exercise)  103 bpm    Heart Rate (Exit)  73 bpm    Rating of  Perceived Exertion (Exercise)  12    Perceived Dyspnea (Exercise)  0    Symptoms  None    Comments  Pt graduated from Cardiac Rehab    Duration  Continue with 30 min of aerobic exercise without signs/symptoms of physical distress.    Intensity  THRR unchanged      Progression   Progression  Continue to progress workloads to maintain intensity without signs/symptoms of physical distress.    Average METs  4.74      Resistance Training   Training Prescription  Yes    Weight  5lbs    Reps  10-15    Time  10 Minutes      Treadmill   MPH  3    Grade  6    Minutes  15    METs  5.78      NuStep   Level  6    SPM  95    Minutes  15    METs  3.7      Home Exercise Plan   Plans to continue exercise at  [employer] (comment)   Walking   Frequency  Add 4 additional days to program exercise sessions.  Initial Home Exercises Provided  03/20/19       Functional Capacity: 6 Minute Walk    Row Name 03/20/19 1004 05/30/19 1333       6 Minute Walk   Phase  Initial  Discharge    Distance  1614 feet  1650 feet    Distance % Change  --  2.23 %    Distance Feet Change  --  36 ft    Walk Time  6 minutes  6 minutes    # of Rest Breaks  0  0    MPH  3.06  3.1    METS  3.15  3.3    RPE  12  10    Perceived Dyspnea   0  0    VO2 Peak  11.02  11.4    Symptoms  No  No    Resting HR  64 bpm  82 bpm    Resting BP  114/70  122/72    Resting Oxygen Saturation   100 %  --    Exercise Oxygen Saturation  during 6 min walk  100 %  --    Max Ex. HR  89 bpm  95 bpm    Max Ex. BP  142/82  138/70    2 Minute Post BP  128/82  118/60       Psychological, QOL, Others - Outcomes: PHQ 2/9: Depression screen [facility] 2/9 06/11/2019 06/11/2019 03/20/2019 11/13/2013  Decreased Interest 0 0 0 0  Down, Depressed, Hopeless 0 0 1 0  PHQ - 2 Score 0 0 1 0    Quality of Life: Quality of Life - 06/05/19 0658      Quality of Life   Select  Quality of Life      Quality of Life Scores    Health/Function Pre  21.96 %    Health/Function Post  27.79 %    Health/Function % Change  26.55 %    Socioeconomic Pre  27.86 %    Socioeconomic Post  30 %    Socioeconomic % Change   7.68 %    Psych/Spiritual Pre  18.79 %    Psych/Spiritual Post  29.14 %    Psych/Spiritual % Change  55.08 %    Family Pre  26.8 %    Family Post  30 %    Family % Change  11.94 %    GLOBAL Pre  23.31 %    GLOBAL Post  28.88 %    GLOBAL % Change  23.9 %       Personal Goals: Goals established at orientation with interventions provided to work toward goal. Personal Goals and Risk Factors at Admission - 03/20/19 1100      Core Components/Risk Factors/Patient Goals on Admission   Hypertension  Yes    Intervention  Provide education on lifestyle modifcations including regular physical activity/exercise, weight management, moderate sodium restriction and increased consumption of fresh fruit, vegetables, and low fat dairy, alcohol moderation, and smoking cessation.;Monitor prescription use compliance.    Expected Outcomes  Short Term: Continued assessment and intervention until BP is < 140/38m HG in hypertensive participants. < 130/810mHG in hypertensive participants with diabetes, heart failure or chronic kidney disease.;Long Term: Maintenance of blood pressure at goal levels.    Lipids  Yes    Intervention  Provide education and support for participant on nutrition & aerobic/resistive exercise along with prescribed medications to achieve LDL <7027mHDL >17m10m  Expected Outcomes  Short  Term: Participant states understanding of desired cholesterol values and is compliant with medications prescribed. Participant is following exercise prescription and nutrition guidelines.;Long Term: Cholesterol controlled with medications as prescribed, with individualized exercise RX and with personalized nutrition plan. Value goals: LDL < 74m, HDL > 40 mg.        Personal Goals Discharge: Goals and Risk Factor Review     Row Name 04/15/19 0962202/15/21 1640 05/13/19 1030 06/11/19 0837       Core Components/Risk Factors/Patient Goals Review   Personal Goals Review  Hypertension;Lipids  --  Hypertension;Lipids  Hypertension;Lipids    Review  Cole Martinez doing well with exercise using the virtual cardiac rehab better hearts APP  Cole Martinez well in his first CR in-person exercise session today. He denied cardiac symptoms and was eager to work to an RPE of 11-13.  Mr. SArizpecontinues to do very well in CR. His VS remain stable and he continues to work to an RPE of 11-13. Mr. SCapuanostates he sees a significant improvement in his stamina and strength and ADLs are much easier. He has met with the RD for dietary modifications.  Mr. SHeggiecontinues to do very well in CR. His VS remain stable and he continues to work to an RPE of 11-13.    Expected Outcomes  Patient will continue to participate in cardiac rehab for exercise, nutrtion and lifestyle modifications  Patient will continue to participate in cardiac rehab for exercise, nutrtion and lifestyle modifications  Patient will continue to participate in cardiac rehab for exercise, nutrtion and lifestyle modifications  Cole Santeewill continue to exercise at home upon discharge from phase 2 cardiac rehab follow nutrtion and lifestyle modifications       Exercise Goals and Review: Exercise Goals    Row Name 03/20/19 0942             Exercise Goals   Increase Physical Activity  Yes       Intervention  Provide advice, education, support and counseling about physical activity/exercise needs.;Develop an individualized exercise prescription for aerobic and resistive training based on initial evaluation findings, risk stratification, comorbidities and participant's personal goals.       Expected Outcomes  Short Term: Attend rehab on a regular basis to increase amount of physical activity.;Long Term: Exercising regularly at least 3-5 days a week.;Long Term: Add in home exercise to  make exercise part of routine and to increase amount of physical activity.       Increase Strength and Stamina  Yes       Intervention  Provide advice, education, support and counseling about physical activity/exercise needs.;Develop an individualized exercise prescription for aerobic and resistive training based on initial evaluation findings, risk stratification, comorbidities and participant's personal goals.       Expected Outcomes  Short Term: Increase workloads from initial exercise prescription for resistance, speed, and METs.;Short Term: Perform resistance training exercises routinely during rehab and add in resistance training at home;Long Term: Improve cardiorespiratory fitness, muscular endurance and strength as measured by increased METs and functional capacity (6MWT)       Able to understand and use rate of perceived exertion (RPE) scale  Yes       Intervention  Provide education and explanation on how to use RPE scale       Expected Outcomes  Short Term: Able to use RPE daily in rehab to express subjective intensity level;Long Term:  Able to use RPE to guide intensity level when exercising independently  Knowledge and understanding of Target Heart Rate Range (THRR)  Yes       Intervention  Provide education and explanation of THRR including how the numbers were predicted and where they are located for reference       Expected Outcomes  Short Term: Able to state/look up THRR;Long Term: Able to use THRR to govern intensity when exercising independently;Short Term: Able to use daily as guideline for intensity in rehab       Able to check pulse independently  Yes       Intervention  Provide education and demonstration on how to check pulse in carotid and radial arteries.;Review the importance of being able to check your own pulse for safety during independent exercise       Expected Outcomes  Short Term: Able to explain why pulse checking is important during independent exercise;Long Term:  Able to check pulse independently and accurately       Understanding of Exercise Prescription  Yes       Intervention  Provide education, explanation, and written materials on patient's individual exercise prescription       Expected Outcomes  Short Term: Able to explain program exercise prescription;Long Term: Able to explain home exercise prescription to exercise independently          Exercise Goals Re-Evaluation: Exercise Goals Re-Evaluation    Row Name 04/14/19 1221 05/15/19 1112 06/12/19 1437 06/19/19 0803       Exercise Goal Re-Evaluation   Exercise Goals Review  --  Increase Strength and Stamina;Increase Physical Activity;Able to understand and use rate of perceived exertion (RPE) scale;Knowledge and understanding of Target Heart Rate Range (THRR);Able to check pulse independently;Understanding of Exercise Prescription  Increase Strength and Stamina;Increase Physical Activity;Able to understand and use rate of perceived exertion (RPE) scale;Knowledge and understanding of Target Heart Rate Range (THRR);Understanding of Exercise Prescription;Able to check pulse independently  Increase Strength and Stamina;Increase Physical Activity;Able to understand and use rate of perceived exertion (RPE) scale;Knowledge and understanding of Target Heart Rate Range (THRR);Understanding of Exercise Prescription;Able to check pulse independently    Comments  Patient has been actively participating in the virtual cardiac rehab program via the [facility] app and has been progressing well. Paitent is walking 35-60 minutes, 3-6 days/week without issue. Patient is eager to begin the onsite cardiac rehab program.  Pt is continuing to increase his cardiovascular fitness. Pt is very eager to increase workloads. Will continue to work with pt to learn when it is appropriate to increase and when he should wait.  Pt is continuing to put forth great effort with exercise. Pt thoroughly enjoys rehab. Pt will be graduating  rehab soon and is confident he can continue to exercise on his own.  Pt completed 24 sessions of cardiac rehab. Pt increased functional capacity by 2.23%, increasing post 6WMT distance by 42f. Pt started walking on the treadmill at 2.3/0 at the beginning of the program and was able to progress to 3.0/6.0. Pt consistently put forth great effort with exercise. He not only increased cardiovascular fitness, but also muscular strength.    Expected Outcomes  Patient will transition to the onsite cardiac rehab program.  Pt will continue to walk daily for 30-45 minutes.  Pt will continue to increase their cardiovascular fitness and stamina. Will continue to monitor.  Pt will continue to exercise most days of the week at local gym facility and walking. Pt plans to exercise 4-6 days a week for 45-60 minutes.  Nutrition & Weight - Outcomes: Pre Biometrics - 03/20/19 1008      Pre Biometrics   Waist Circumference  42 inches    Hip Circumference  38 inches    Waist to Hip Ratio  1.11 %    Triceps Skinfold  10 mm    % Body Fat  27.4 %    Grip Strength  23.5 kg    Flexibility  12 in    Single Leg Stand  9.52 seconds      Post Biometrics - 05/30/19 1334       Post  Biometrics   Height  5' 7"  (1.702 m)    Weight  81.8 kg    Waist Circumference  41 inches    Hip Circumference  39 inches    Waist to Hip Ratio  1.05 %    BMI (Calculated)  28.24    Triceps Skinfold  10 mm    % Body Fat  26.9 %    Grip Strength  27 kg    Flexibility  16 in    Single Leg Stand  10.05 seconds       Nutrition: Nutrition Therapy & Goals - 04/23/19 1201      Nutrition Therapy   Diet  Heart Healthy      Personal Nutrition Goals   Nutrition Goal  Pt to build a healthy plate including vegetables, fruits, whole grains, and low-fat dairy products in a heart healthy meal plan.      Intervention Plan   Intervention  Nutrition handout(s) given to patient.;Prescribe, educate and counsel regarding individualized  specific dietary modifications aiming towards targeted core components such as weight, hypertension, lipid management, diabetes, heart failure and other comorbidities.    Expected Outcomes  Short Term Goal: Understand basic principles of dietary content, such as calories, fat, sodium, cholesterol and nutrients.       Nutrition Discharge: Nutrition Assessments - 06/10/19 1332      MEDFICTS Scores   Post Score  18       Education Questionnaire Score: Knowledge Questionnaire Score - 06/06/19 1402      Knowledge Questionnaire Score   Pre Score  19/24    Post Score  20/24       Goals reviewed with patient; copy given to patient. Pt graduated from cardiac rehab program on 06/13/19 with completion of 24 exercise sessions in Phase II. Pt maintained good attendance and progressed nicely during his participation in rehab as evidenced by increased MET level. Cole Martinez increased his distance on his post exercise walk test  Medication list reconciled. Repeat  PHQ score-  0.  Pt has made significant lifestyle changes and should be commended for his success. Pt feels he has achieved his goals during cardiac rehab.   Pt plans to continue exercise by continuing exercise at his local gym and walking 4-6- days a week. We are proud of Cole Martinez's progress! Barnet Pall, RN,BSN 07/04/2019 2:25 PM

## 2019-07-24 DIAGNOSIS — E038 Other specified hypothyroidism: Secondary | ICD-10-CM | POA: Diagnosis not present

## 2019-07-24 DIAGNOSIS — N400 Enlarged prostate without lower urinary tract symptoms: Secondary | ICD-10-CM | POA: Diagnosis not present

## 2019-07-24 DIAGNOSIS — M1990 Unspecified osteoarthritis, unspecified site: Secondary | ICD-10-CM | POA: Diagnosis not present

## 2019-07-24 DIAGNOSIS — E7800 Pure hypercholesterolemia, unspecified: Secondary | ICD-10-CM | POA: Diagnosis not present

## 2019-07-24 DIAGNOSIS — I2510 Atherosclerotic heart disease of native coronary artery without angina pectoris: Secondary | ICD-10-CM | POA: Diagnosis not present

## 2019-07-24 DIAGNOSIS — M47816 Spondylosis without myelopathy or radiculopathy, lumbar region: Secondary | ICD-10-CM | POA: Diagnosis not present

## 2019-07-24 DIAGNOSIS — I10 Essential (primary) hypertension: Secondary | ICD-10-CM | POA: Diagnosis not present

## 2019-08-13 ENCOUNTER — Telehealth: Payer: Self-pay | Admitting: Interventional Cardiology

## 2019-08-13 NOTE — Telephone Encounter (Signed)
New Message  Patient called and stated that he is requesting to speak with a nurse about an important question due to plavix. Please call to discuss

## 2019-08-13 NOTE — Telephone Encounter (Signed)
Okay to stop aspirin. Give follow up report in 2 weeks

## 2019-08-13 NOTE — Telephone Encounter (Signed)
Pt states that he is having terrible bruising on ASA and Plavix.  States anytime he gets a cut it bleeds a lot.  Stent was placed 02/2019. Pt and wife concerned about all of the bruising.  He does know this is a side effect of these medications.  States it was mentioned that sometimes pt's only stay on both meds for 6 months.  Advised I would send to Dr. Tamala Julian for review.

## 2019-08-14 NOTE — Telephone Encounter (Signed)
Spoke with pt and made him aware of recommendations.  Pt agreeable to plan and was appreciative for call.

## 2019-09-25 DIAGNOSIS — K219 Gastro-esophageal reflux disease without esophagitis: Secondary | ICD-10-CM | POA: Diagnosis not present

## 2019-09-25 DIAGNOSIS — I2510 Atherosclerotic heart disease of native coronary artery without angina pectoris: Secondary | ICD-10-CM | POA: Diagnosis not present

## 2019-09-25 DIAGNOSIS — C9110 Chronic lymphocytic leukemia of B-cell type not having achieved remission: Secondary | ICD-10-CM | POA: Diagnosis not present

## 2019-09-25 DIAGNOSIS — F900 Attention-deficit hyperactivity disorder, predominantly inattentive type: Secondary | ICD-10-CM | POA: Diagnosis not present

## 2019-09-25 DIAGNOSIS — E291 Testicular hypofunction: Secondary | ICD-10-CM | POA: Diagnosis not present

## 2019-09-25 DIAGNOSIS — I10 Essential (primary) hypertension: Secondary | ICD-10-CM | POA: Diagnosis not present

## 2019-09-25 DIAGNOSIS — E7800 Pure hypercholesterolemia, unspecified: Secondary | ICD-10-CM | POA: Diagnosis not present

## 2019-09-25 DIAGNOSIS — N400 Enlarged prostate without lower urinary tract symptoms: Secondary | ICD-10-CM | POA: Diagnosis not present

## 2019-10-08 ENCOUNTER — Encounter: Payer: Self-pay | Admitting: Dermatology

## 2019-10-08 ENCOUNTER — Ambulatory Visit (INDEPENDENT_AMBULATORY_CARE_PROVIDER_SITE_OTHER): Payer: Medicare Other | Admitting: Dermatology

## 2019-10-08 ENCOUNTER — Other Ambulatory Visit: Payer: Self-pay

## 2019-10-08 DIAGNOSIS — Z1283 Encounter for screening for malignant neoplasm of skin: Secondary | ICD-10-CM

## 2019-10-08 DIAGNOSIS — D225 Melanocytic nevi of trunk: Secondary | ICD-10-CM | POA: Diagnosis not present

## 2019-10-08 DIAGNOSIS — L821 Other seborrheic keratosis: Secondary | ICD-10-CM | POA: Diagnosis not present

## 2019-10-08 DIAGNOSIS — I25119 Atherosclerotic heart disease of native coronary artery with unspecified angina pectoris: Secondary | ICD-10-CM | POA: Diagnosis not present

## 2019-10-08 DIAGNOSIS — D229 Melanocytic nevi, unspecified: Secondary | ICD-10-CM

## 2019-10-20 ENCOUNTER — Telehealth: Payer: Self-pay

## 2019-10-20 DIAGNOSIS — Z23 Encounter for immunization: Secondary | ICD-10-CM | POA: Diagnosis not present

## 2019-10-20 NOTE — Telephone Encounter (Signed)
Returned patient's phone call about taking third COVID shot. 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 Advised patient we are not giving them out at the [facility] just yet, he could get a third dose at [facility] or [facility].

## 2019-10-29 DIAGNOSIS — N401 Enlarged prostate with lower urinary tract symptoms: Secondary | ICD-10-CM | POA: Diagnosis not present

## 2019-10-29 DIAGNOSIS — N5201 Erectile dysfunction due to arterial insufficiency: Secondary | ICD-10-CM | POA: Diagnosis not present

## 2019-10-29 DIAGNOSIS — R351 Nocturia: Secondary | ICD-10-CM | POA: Diagnosis not present

## 2019-10-29 DIAGNOSIS — R972 Elevated prostate specific antigen [PSA]: Secondary | ICD-10-CM | POA: Diagnosis not present

## 2019-10-29 DIAGNOSIS — N35011 Post-traumatic bulbous urethral stricture: Secondary | ICD-10-CM | POA: Diagnosis not present

## 2019-11-04 ENCOUNTER — Encounter: Payer: Self-pay | Admitting: Podiatry

## 2019-11-04 ENCOUNTER — Other Ambulatory Visit: Payer: Self-pay

## 2019-11-04 ENCOUNTER — Ambulatory Visit (INDEPENDENT_AMBULATORY_CARE_PROVIDER_SITE_OTHER): Payer: Medicare Other | Admitting: Podiatry

## 2019-11-04 VITALS — Temp 98.0°F

## 2019-11-04 DIAGNOSIS — M79676 Pain in unspecified toe(s): Secondary | ICD-10-CM | POA: Diagnosis not present

## 2019-11-04 DIAGNOSIS — B351 Tinea unguium: Secondary | ICD-10-CM

## 2019-11-04 DIAGNOSIS — M79672 Pain in left foot: Secondary | ICD-10-CM

## 2019-11-04 DIAGNOSIS — M79671 Pain in right foot: Secondary | ICD-10-CM

## 2019-11-04 DIAGNOSIS — L84 Corns and callosities: Secondary | ICD-10-CM

## 2019-11-04 NOTE — Progress Notes (Signed)
Subjective:  Patient ID: Cole Martinez, male    DOB: 04/24/1945,  MRN: [id number]  74 y.o. male presents with painful callus(es) b/l and painful thick toenails that are difficult to trim. Painful toenails interfere with ambulation. Aggravating factors include wearing enclosed shoe gear. Pain is relieved with periodic professional debridement. Painful calluses are aggravated when weightbearing with and without shoegear. Pain is relieved with periodic professional debridement.   He states he has been away so long due to the [place] pandemic.  Review of Systems: Negative except as noted in the HPI.  Past Medical History:  Diagnosis Date  . ADHD (attention deficit hyperactivity disorder)   . B12 deficiency   . BPH (benign prostatic hypertrophy)   . 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 Coronary artery disease CARDIOLOGIST- DR Daneen Schick   S/P STENTING LAD 1999 // Myoview 01/2019: EF 62, normal perfusion; Low Risk  . Crohn's disease of ileum ([place]) SINCE 1988  . ED (erectile dysfunction)   . Elevated PSA   . Essential and other specified forms of tremor 11/25/2012  . H/O adenomatous polyp of colon   . Hyperlipidemia   . Nocturia   . OA (osteoarthritis)   . Peripheral neuropathy    hx of, none current as of 08-04-13  . Peyronie disease   . S/P coronary artery stent placement OCT 1999 OF LAD  . Tremor, hereditary, benign MILD RIGHT HAND   Past Surgical History:  Procedure Laterality Date  . CARDIOVASCULAR STRESS TEST  11-01-2010 DR Daneen Schick   NORMAL PERFUSION STUDY/ EF 64%/ NO ISCHEMIA  . cataract surgery  Bilateral feburary 2020   with lens placement   . COLONOSCOPY WITH PROPOFOL N/A 09/24/2012   Procedure: COLONOSCOPY WITH PROPOFOL;  Surgeon: [name], MD;  [facility];  Service: Endoscopy;  Laterality: N/A;  . CORONARY ANGIOPLASTY WITH STENT PLACEMENT  OCT  1999   STENT OF LAD  . CORONARY STENT INTERVENTION N/A 02/14/2019   Procedure: CORONARY STENT INTERVENTION;  Surgeon: Belva Crome, MD;  Location: [place] [facility];  Service: Cardiovascular;  Laterality: N/A;  . CYSTOSCOPY WITH URETHRAL DILATATION  03/12/2017   Procedure: CYSTOSCOPY WITH URETHRAL DILATATION;  Surgeon: Gaynelle Arabian, MD;  [facility];  Service: Orthopedics;;  Ammie Dalton, Resident Assisting  . CYSTOSCOPY WITH URETHRAL DILATATION N/A 10/03/2018   Procedure: CYSTOSCOPY WITH URETHRAL BALLOON DILATATION WITH BILATERAL RETROGRADE PYELOGRAPHY;  Surgeon: Raynelle Bring, MD;  [facility];  Service: Urology;  Laterality: N/A;  . LAPAROSCOPIC INGUINAL HERNIA REPAIR Bilateral 01-10-2004   W/ MESH  . LEFT HEART CATH AND CORONARY ANGIOGRAPHY N/A 02/13/2019   Procedure: LEFT HEART CATH AND CORONARY ANGIOGRAPHY;  Surgeon: Belva Crome, MD;  Location: [place] [facility];  Service: Cardiovascular;  Laterality: N/A;  . neck benign removed from neck  yrs ago  . PROSTATE BIOPSY N/A 07/12/2012   Procedure: PROSTATE BIOPSY AND ULTRASOUND;  Surgeon: Ailene Rud, MD;  [facility]: [facility];  Service: Urology;  Laterality: N/A;  . PROSTATE SURGERY  2002   tuna  . REMOVAL LEFT NECK LYMPH NODE  1996  . TONSILLECTOMY  CHILD  . TOTAL KNEE ARTHROPLASTY Right 03/12/2017   Procedure: RIGHT TOTAL KNEE ARTHROPLASTY;  Surgeon: Gaynelle Arabian, MD;  [facility];  Service: Orthopedics;  Laterality: Right;  Adductor Block  . TRANSURETHRAL RESECTION OF PROSTATE N/A 08/08/2013  Procedure: TRANSURETHRAL RESECTION OF THE PROSTATE WITH GYRUS INSTRUMENTS;  Surgeon: Ailene Rud, MD;  [facility];  Service: Urology;  Laterality: N/A;  . TRANSURETHRAL RESECTION OF PROSTATE     Patient Active Problem List   Diagnosis Date Noted  . Angina pectoris ([place]) 02/13/2019  . OA (osteoarthritis) of knee 03/12/2017  . Tremor, essential 12/03/2015  . Essential hypertension  12/09/2014  . Coronary artery disease involving native heart 12/08/2013  . 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[place]) 12/08/2013  . Hyperlipidemia 12/08/2013  . Benign prostatic hypertrophy 08/08/2013  . Essential and other specified forms of tremor 11/25/2012    Current Outpatient Medications:  .  acetaminophen (TYLENOL) 500 MG tablet, Take 1,000 mg by mouth at bedtime as needed for mild pain (joint pain). , Disp: , Rfl:  .  amLODipine (NORVASC) 2.5 MG tablet, TAKE 1 TABLET BY MOUTH  DAILY, Disp: 90 tablet, Rfl: 3 .  amphetamine-dextroamphetamine (ADDERALL) 5 MG tablet, Take 5 mg by mouth 2 (two) times daily., Disp: , Rfl:  .  aspirin EC 81 MG tablet, Take 81 mg by mouth daily. , Disp: , Rfl:  .  atorvastatin (LIPITOR) 20 MG tablet, TAKE 1 TABLET BY MOUTH IN  THE MORNING, Disp: 90 tablet, Rfl: 3 .  calcium citrate (CALCITRATE - DOSED IN MG ELEMENTAL CALCIUM) 950 (200 Ca) MG tablet, Take 200 mg of elemental calcium by mouth daily., Disp: , Rfl:  .  Carboxymethylcellul-Glycerin (LUBRICATING EYE DROPS OP), Place 1 drop into both eyes daily as needed (dry eyes)., Disp: , Rfl:  .  Cholecalciferol (VITAMIN D3) 50 MCG (2000 UT) capsule, Take 2,000 Units by mouth daily. , Disp: , Rfl:  .  clopidogrel (PLAVIX) 75 MG tablet, Take 1 tablet (75 mg total) by mouth daily., Disp: 90 tablet, Rfl: 3 .  Coenzyme Q10 (CO Q-10) 200 MG CAPS, Take 200 mg by mouth daily. , Disp: , Rfl:  .  Cyanocobalamin (VITAMIN B-12) 2500 MCG SUBL, Place 2,500 mcg under the tongue daily., Disp: , Rfl:  .  metoprolol succinate (TOPROL-XL) 25 MG 24 hr tablet, Take 1 tablet (25 mg total) by mouth daily., Disp: 90 tablet, Rfl: 3 .  pantoprazole (PROTONIX) 40 MG tablet, Take 1 tablet (40 mg total) by mouth daily., Disp: 90 tablet, Rfl: 3 No Known Allergies Social History   Occupational History  . Not on file  Tobacco Use  . Smoking status: Former Smoker    Packs/day: 1.00    Years: 10.00    Pack years: 10.00    Types: Cigarettes     Quit date: 07/09/1984    Years since quitting: 35.3  . Smokeless tobacco: Never Used  Vaping Use  . Vaping Use: Never used  Substance and Sexual Activity  . Alcohol use: Yes    Alcohol/week: 2.0 standard drinks    Types: 2 Standard drinks or equivalent per week    Comment: daily 1 per day ,   . Drug use: No  . Sexual activity: Not on file    Objective:   Constitutional Pt is a pleasant 74 y.o. Caucasian male in NAD. AAO x 3.   Vascular Capillary refill time to digits immediate b/l. Palpable pedal pulses b/l LE. Pedal hair absent. Lower extremity skin temperature gradient within normal limits. No pain with calf compression b/l. No edema noted b/l lower extremities. No cyanosis or clubbing noted.  Neurologic Normal speech. Oriented to person, place, and time. Protective sensation intact 5/5 intact bilaterally with 10g monofilament b/l. Vibratory sensation  intact b/l. Proprioception intact bilaterally.  Dermatologic Pedal skin is thin shiny, atrophic b/l lower extremities. No open wounds bilaterally. No interdigital macerations bilaterally. Toenails 1-5 b/l elongated, discolored, dystrophic, thickened, crumbly with subungual debris and tenderness to dorsal palpation. Hyperkeratotic lesion(s) submet head 2 left foot, submet head 2 right foot and submet head 5 left foot.  No erythema, no edema, no drainage, no flocculence.  Orthopedic: Normal muscle strength 5/5 to all lower extremity muscle groups bilaterally. No pain crepitus or joint limitation noted with ROM b/l. Hammertoes noted to the 1-5 left.   Radiographs: None Assessment:   1. Pain due to onychomycosis of toenail   2. Callus   3. Pain in both feet    Plan:  Patient was evaluated and treated and all questions answered.  Onychomycosis with pain -Nails palliatively debridement as below. -Educated on self-care  Procedure: Nail Debridement Rationale: Pain Type of Debridement: manual, sharp debridement. Instrumentation: Nail  nipper, rotary burr. Number of Nails: 10  -Examined patient. -Toenails 1-5 b/l were debrided in length and girth with sterile nail nippers and dremel without iatrogenic bleeding.  -Callus(es) submet head 2 left foot, submet head 2 right foot and submet head 5 right foot pared utilizing sterile scalpel blade without complication or incident. Total number debrided =3. -Patient to report any pedal injuries to medical professional immediately. -Patient to continue soft, supportive shoe gear daily. -Patient/POA to call should there be question/concern in the interim.  Return in about 3 months (around 02/03/2020).  Marzetta Board, DPM

## 2019-11-18 NOTE — Progress Notes (Signed)
Cardiology Office Note:    Date:  11/19/2019   ID:  Cole Martinez, DOB 04/26/1945, MRN [id number]  PCP:  Josetta Huddle, MD  Cardiologist:  Sinclair Grooms, MD   Referring MD: Josetta Huddle, MD   Chief Complaint  Patient presents with  . Coronary Artery Disease  . Advice Only    Anxiety and Depression    History of Present Illness:    Cole Martinez is a 74 y.o. male with a hx of CAD with LAD stent 1999,, hyperlipidemia, hypertension, obesity and recurrent angina leading to DES RCA 02/2019. Residual LAD ISR being treated medically.  [name] is not having angina.  He has not been as engaged recently because of stress and anxiety related to many factors including the pandemic and relationship with his wife.  He has not needed sublingual nitroglycerin.  He reveals that he is having some difficulty with his memory which is also stressful.  Past Medical History:  Diagnosis Date  . ADHD (attention deficit hyperactivity disorder)   . B12 deficiency   . BPH (benign prostatic hypertrophy)   . 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 Coronary artery disease CARDIOLOGIST- DR Daneen Schick   S/P STENTING LAD 1999 // Myoview 01/2019: EF 62, normal perfusion; Low Risk  . Crohn's disease of ileum ([place]) SINCE 1988  . ED (erectile dysfunction)   . Elevated PSA   . Essential and other specified forms of tremor 11/25/2012  . H/O adenomatous polyp of colon   . Hyperlipidemia   . Nocturia   . OA (osteoarthritis)   . Peripheral neuropathy    hx of, none current as of 08-04-13  . Peyronie disease   . S/P coronary artery stent placement OCT 1999 OF LAD  . Tremor, hereditary, benign MILD RIGHT HAND    Past Surgical History:  Procedure Laterality Date  . CARDIOVASCULAR STRESS TEST  11-01-2010 DR Daneen Schick   NORMAL PERFUSION STUDY/ EF 64%/ NO ISCHEMIA  . cataract surgery  Bilateral  feburary 2020   with lens placement   . COLONOSCOPY WITH PROPOFOL N/A 09/24/2012   Procedure: COLONOSCOPY WITH PROPOFOL;  Surgeon: [name], MD;  [facility];  Service: Endoscopy;  Laterality: N/A;  . CORONARY ANGIOPLASTY WITH STENT PLACEMENT  OCT 1999   STENT OF LAD  . CORONARY STENT INTERVENTION N/A 02/14/2019   Procedure: CORONARY STENT INTERVENTION;  Surgeon: Belva Crome, MD;  Location: [place] [facility];  Service: Cardiovascular;  Laterality: N/A;  . CYSTOSCOPY WITH URETHRAL DILATATION  03/12/2017   Procedure: CYSTOSCOPY WITH URETHRAL DILATATION;  Surgeon: Gaynelle Arabian, MD;  [facility];  Service: Orthopedics;;  Ammie Dalton, Resident Assisting  . CYSTOSCOPY WITH URETHRAL DILATATION N/A 10/03/2018   Procedure: CYSTOSCOPY WITH URETHRAL BALLOON DILATATION WITH BILATERAL RETROGRADE PYELOGRAPHY;  Surgeon: Raynelle Bring, MD;  [facility];  Service: Urology;  Laterality: N/A;  . LAPAROSCOPIC INGUINAL HERNIA REPAIR Bilateral 01-10-2004   W/ MESH  . LEFT HEART CATH AND CORONARY ANGIOGRAPHY N/A 02/13/2019   Procedure: LEFT HEART CATH AND CORONARY ANGIOGRAPHY;  Surgeon: Belva Crome, MD;  Location: [place] [facility];  Service: Cardiovascular;  Laterality: N/A;  . neck benign removed from neck  yrs ago  . PROSTATE BIOPSY N/A 07/12/2012   Procedure: PROSTATE BIOPSY AND ULTRASOUND;  Surgeon: Ailene Rud, MD;  Location: [place];  Service:  Urology;  Laterality: N/A;  . PROSTATE SURGERY  2002   tuna  . REMOVAL LEFT NECK LYMPH NODE  1996  . TONSILLECTOMY  CHILD  . TOTAL KNEE ARTHROPLASTY Right 03/12/2017   Procedure: RIGHT TOTAL KNEE ARTHROPLASTY;  Surgeon: Gaynelle Arabian, MD;  [facility];  Service: Orthopedics;  Laterality: Right;  Adductor Block  . TRANSURETHRAL RESECTION OF PROSTATE N/A 08/08/2013   Procedure: TRANSURETHRAL RESECTION OF THE PROSTATE WITH GYRUS INSTRUMENTS;  Surgeon: Ailene Rud, MD;  [facility];  Service:  Urology;  Laterality: N/A;  . TRANSURETHRAL RESECTION OF PROSTATE      Current Medications: Current Meds  Medication Sig  . acetaminophen (TYLENOL) 500 MG tablet Take 1,000 mg by mouth at bedtime as needed for mild pain (joint pain).   Marland Kitchen amLODipine (NORVASC) 2.5 MG tablet TAKE 1 TABLET BY MOUTH  DAILY  . amphetamine-dextroamphetamine (ADDERALL) 5 MG tablet Take 5 mg by mouth 2 (two) times daily.  Marland Kitchen aspirin EC 81 MG tablet Take 81 mg by mouth daily.   Marland Kitchen atorvastatin (LIPITOR) 20 MG tablet TAKE 1 TABLET BY MOUTH IN  THE MORNING  . calcium citrate (CALCITRATE - DOSED IN MG ELEMENTAL CALCIUM) 950 (200 Ca) MG tablet Take 200 mg of elemental calcium by mouth daily.  . Cholecalciferol (VITAMIN D3) 50 MCG (2000 UT) capsule Take 2,000 Units by mouth daily.   . clopidogrel (PLAVIX) 75 MG tablet Take 1 tablet (75 mg total) by mouth daily.  . Coenzyme Q10 (CO Q-10) 200 MG CAPS Take 200 mg by mouth daily.   . Cyanocobalamin (VITAMIN B-12) 2500 MCG SUBL Place 2,500 mcg under the tongue daily.  . metoprolol succinate (TOPROL-XL) 25 MG 24 hr tablet Take 1 tablet (25 mg total) by mouth daily.     Allergies:   Patient has no known allergies.   Social History   Socioeconomic History  . Marital status: Married    Spouse name: Not on file  . Number of children: 3  . Years of education: 16  . Highest education level: Bachelor's degree (e.g., BA, AB, BS)  Occupational History  . Not on file  Tobacco Use  . Smoking status: Former Smoker    Packs/day: 1.00    Years: 10.00    Pack years: 10.00    Types: Cigarettes    Quit date: 07/09/1984    Years since quitting: 35.3  . Smokeless tobacco: Never Used  Vaping Use  . Vaping Use: Never used  Substance and Sexual Activity  . Alcohol use: Yes    Alcohol/week: 2.0 standard drinks    Types: 2 Standard drinks or equivalent per week    Comment: daily 1 per day ,   . Drug use: No  . Sexual activity: Not on file  Other Topics Concern  . Not on file    Social History Narrative   Lives at home with is wife, [name].  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  Has BBA.  Has 3 children.    Caffeine 2 cups coffee.     Social Determinants of Health   Financial Resource Strain:   . Difficulty of Paying Living Expenses: Not on file  Food Insecurity: No Food Insecurity  . Worried About Charity fundraiser in the Last Year: Never true  . Ran Out of Food in the Last Year: Never true  Transportation Needs: No Transportation Needs  . Lack of Transportation (Medical): No  . Lack of Transportation (Non-Medical): No  Physical Activity: Insufficiently Active  .  Days of Exercise per Week: 3 days  . Minutes of Exercise per Session: 40 min  Stress:   . Feeling of Stress : Not on file  Social Connections: Unknown  . Frequency of Communication with Friends and Family: More than three times a week  . Frequency of Social Gatherings with Friends and Family: Once a week  . Attends Religious Services: Not on file  . Active Member of Clubs or Organizations: Not on file  . Attends Archivist Meetings: Not on file  . Marital Status: Married     Family History: The patient's family history includes Diabetes in his brother; Obesity in his brother; Parkinsonism in his brother.  ROS:   Please see the history of present illness.    He is worried about possibly getting COVID-19.  He feels he is under stress and having anxiety.  His memory is significantly decreased.  He has not spoken to his primary physician about this.  I explained there could be a chemical imbalance contributing that could be improved with medical therapy.  He should also have the memory issue vetted/evaluated by his primary care.  All other systems reviewed and are negative.  EKGs/Labs/Other Studies Reviewed:    The following studies were reviewed today: No new data  EKG:  EKG a new tracing is not performed  Recent Labs: 04/29/2019: ALT 15; BUN 26; Creatinine, Ser 1.09; Hemoglobin 15.0;  Platelets 239; Potassium 4.3; Sodium 138  Recent Lipid Panel    Component Value Date/Time   CHOL 154 12/14/2015 0806   TRIG 118 12/14/2015 0806   HDL 47 12/14/2015 0806   CHOLHDL 3.3 12/14/2015 0806   VLDL 24 12/14/2015 0806   LDLCALC 83 12/14/2015 0806    Physical Exam:    VS:  BP 122/72   Pulse 62   Ht 5' 8"  (1.727 m)   Wt 180 lb 3.2 oz (81.7 kg)   SpO2 99%   BMI 27.40 kg/m     Wt Readings from Last 3 Encounters:  11/19/19 180 lb 3.2 oz (81.7 kg)  05/30/19 180 lb 5.4 oz (81.8 kg)  05/19/19 179 lb 9.6 oz (81.5 kg)     GEN: Overweight.  Obvious tremor.. No acute distress HEENT: Normal NECK: No JVD. LYMPHATICS: No lymphadenopathy CARDIAC: RRR without murmur, gallop, or edema. VASCULAR:  Normal Pulses. No bruits. RESPIRATORY:  Clear to auscultation without rales, wheezing or rhonchi  ABDOMEN: Soft, non-tender, non-distended, No pulsatile mass, MUSCULOSKELETAL: Kyphosis is prominent.   SKIN: Warm and dry NEUROLOGIC: Bilateral tremor of arms.  Alert and oriented x 3 PSYCHIATRIC:  Normal affect   ASSESSMENT:    1. Coronary artery disease involving native coronary artery of native heart with angina pectoris ([place])   2. Essential hypertension   3. Mixed hyperlipidemia   4. Anxiety and depression   5. Memory deficit   6. Educated about COVID-19 virus infection    PLAN:    In order of problems listed above:  1. Secondary prevention discussed.  The impact of stress/depression on cardiac symptoms and survival is discussed.  I have recommended that he revealed to Dr. Inda Merlin he has difficulty with memory and also his anxiety/depression which may be situational and treatable with medications. 2. Excellent blood pressure control.  Encouraged to continue amlodipine and Toprol-XL at the current doses. 3. Excellent lipids the last time evaluated but this was 1 year ago.  A lipid panel and liver panel will be done today.  For the time being we will  continue atorvastatin 20  mg/day. 4. Discuss mood/anxiety/depression with Dr. Inda Merlin to determine if this is a situational problem that may be helped by therapy. 5. May need neurology consultation to determine if there is a treatable issue before he gets worse.  Will discuss with primary care, Dr. Mertha Finders. 6. He has been vaccinated with mRNA and looking forward to getting a boost when recommended.  Overall education and awareness concerning secondary risk prevention was discussed in detail: LDL less than 70, hemoglobin A1c less than 7, blood pressure target less than 130/80 mmHg, >150 minutes of moderate aerobic activity per week, avoidance of smoking, weight control (via diet and exercise), and continued surveillance/management of/for obstructive sleep apnea.    Medication Adjustments/Labs and Tests Ordered: Current medicines are reviewed at length with the patient today.  Concerns regarding medicines are outlined above.  Orders Placed This Encounter  Procedures  . Hepatic function panel  . Lipid panel   No orders of the defined types were placed in this encounter.   Patient Instructions  Medication Instructions:  Your physician recommends that you continue on your current medications as directed. Please refer to the Current Medication list given to you today.  *If you need a refill on your cardiac medications before your next appointment, please call your pharmacy*   Lab Work: Lipid and Liver today  If you have labs (blood work) drawn today and your tests are completely normal, you will receive your results only by: Marland Kitchen MyChart Message (if you have MyChart) OR . A paper copy in the mail If you have any lab test that is abnormal or we need to change your treatment, we will call you to review the results.   Testing/Procedures: None   Follow-Up: At [facility], you and your health needs are our priority.  As part of our continuing mission to provide you with exceptional heart care, we have created  designated Provider Care Teams.  These Care Teams include your primary Cardiologist (physician) and Advanced Practice Providers (APPs -  Physician Assistants and Nurse Practitioners) who all work together to provide you with the care you need, when you need it.  We recommend signing up for the patient portal called "MyChart".  Sign up information is provided on this After Visit Summary.  MyChart is used to connect with patients for Virtual Visits (Telemedicine).  Patients are able to view lab/test results, encounter notes, upcoming appointments, etc.  Non-urgent messages can be sent to your provider as well.   To learn more about what you can do with MyChart, go to [email or web address].    Your next appointment:   12 month(s)  The format for your next appointment:   In Person  Provider:   You may see Sinclair Grooms, MD or one of the following Advanced Practice Providers on your designated Care Team:    Truitt Merle, NP  Cecilie Kicks, NP  Kathyrn Drown, NP    Other Instructions      Signed, Sinclair Grooms, MD  11/19/2019 1:02 PM    [place]

## 2019-11-19 ENCOUNTER — Encounter: Payer: Self-pay | Admitting: Interventional Cardiology

## 2019-11-19 ENCOUNTER — Ambulatory Visit (INDEPENDENT_AMBULATORY_CARE_PROVIDER_SITE_OTHER): Payer: Medicare Other | Admitting: Interventional Cardiology

## 2019-11-19 ENCOUNTER — Other Ambulatory Visit: Payer: Self-pay

## 2019-11-19 VITALS — BP 122/72 | HR 62 | Ht 68.0 in | Wt 180.2 lb

## 2019-11-19 DIAGNOSIS — F32A Depression, unspecified: Secondary | ICD-10-CM

## 2019-11-19 DIAGNOSIS — R413 Other amnesia: Secondary | ICD-10-CM | POA: Diagnosis not present

## 2019-11-19 DIAGNOSIS — I25119 Atherosclerotic heart disease of native coronary artery with unspecified angina pectoris: Secondary | ICD-10-CM

## 2019-11-19 DIAGNOSIS — F329 Major depressive disorder, single episode, unspecified: Secondary | ICD-10-CM | POA: Diagnosis not present

## 2019-11-19 DIAGNOSIS — Z7189 Other specified counseling: Secondary | ICD-10-CM

## 2019-11-19 DIAGNOSIS — F419 Anxiety disorder, unspecified: Secondary | ICD-10-CM

## 2019-11-19 DIAGNOSIS — I10 Essential (primary) hypertension: Secondary | ICD-10-CM | POA: Diagnosis not present

## 2019-11-19 DIAGNOSIS — E782 Mixed hyperlipidemia: Secondary | ICD-10-CM

## 2019-11-19 LAB — LIPID PANEL
Chol/HDL Ratio: 3.3 ratio (ref 0.0–5.0)
Cholesterol, Total: 160 mg/dL (ref 100–199)
HDL: 49 mg/dL (ref >39)
LDL Chol Calc (NIH): 89 mg/dL (ref 0–99)
Triglycerides: 126 mg/dL (ref 0–149)
VLDL Cholesterol Cal: 22 mg/dL (ref 5–40)

## 2019-11-19 LAB — HEPATIC FUNCTION PANEL
ALT: 12 IU/L (ref 0–44)
AST: 16 IU/L (ref 0–40)
Albumin: 4.2 g/dL (ref 3.7–4.7)
Alkaline Phosphatase: 74 IU/L (ref 44–121)
Bilirubin Total: 0.9 mg/dL (ref 0.0–1.2)
Bilirubin, Direct: 0.25 mg/dL (ref 0.00–0.40)
Total Protein: 6.3 g/dL (ref 6.0–8.5)

## 2019-11-19 NOTE — Patient Instructions (Signed)
Medication Instructions:  Your physician recommends that you continue on your current medications as directed. Please refer to the Current Medication list given to you today.  *If you need a refill on your cardiac medications before your next appointment, please call your pharmacy*   Lab Work: Lipid and Liver today  If you have labs (blood work) drawn today and your tests are completely normal, you will receive your results only by: Marland Kitchen MyChart Message (if you have MyChart) OR . A paper copy in the mail If you have any lab test that is abnormal or we need to change your treatment, we will call you to review the results.   Testing/Procedures: None   Follow-Up: At [facility], you and your health needs are our priority.  As part of our continuing mission to provide you with exceptional heart care, we have created designated Provider Care Teams.  These Care Teams include your primary Cardiologist (physician) and Advanced Practice Providers (APPs -  Physician Assistants and Nurse Practitioners) who all work together to provide you with the care you need, when you need it.  We recommend signing up for the patient portal called "MyChart".  Sign up information is provided on this After Visit Summary.  MyChart is used to connect with patients for Virtual Visits (Telemedicine).  Patients are able to view lab/test results, encounter notes, upcoming appointments, etc.  Non-urgent messages can be sent to your provider as well.   To learn more about what you can do with MyChart, go to [email or web address].    Your next appointment:   12 month(s)  The format for your next appointment:   In Person  Provider:   You may see Sinclair Grooms, MD or one of the following Advanced Practice Providers on your designated Care Team:    Truitt Merle, NP  Cecilie Kicks, NP  Kathyrn Drown, NP    Other Instructions

## 2019-11-21 ENCOUNTER — Encounter: Payer: Self-pay | Admitting: Dermatology

## 2019-11-21 DIAGNOSIS — R7309 Other abnormal glucose: Secondary | ICD-10-CM | POA: Diagnosis not present

## 2019-11-21 DIAGNOSIS — R6882 Decreased libido: Secondary | ICD-10-CM | POA: Diagnosis not present

## 2019-11-21 DIAGNOSIS — E230 Hypopituitarism: Secondary | ICD-10-CM | POA: Diagnosis not present

## 2019-11-21 DIAGNOSIS — R296 Repeated falls: Secondary | ICD-10-CM | POA: Diagnosis not present

## 2019-11-21 DIAGNOSIS — M8580 Other specified disorders of bone density and structure, unspecified site: Secondary | ICD-10-CM | POA: Diagnosis not present

## 2019-11-21 DIAGNOSIS — E559 Vitamin D deficiency, unspecified: Secondary | ICD-10-CM | POA: Diagnosis not present

## 2019-11-21 NOTE — Progress Notes (Signed)
   Follow-Up Visit   Subjective  Cole Martinez is a 74 y.o. male who presents for the following: Annual Exam (right cheek- ? sk, & left temple- scaly spots).  General skin examination Location:  Duration:  Quality: Possible new spots on face Associated Signs/Symptoms: Modifying Factors:  Severity:  Timing: Context:   Objective  Well appearing patient in no apparent distress; mood and affect are within normal limits.  All skin waist up examined.   Assessment & Plan    Nevus Mid Back  Annual skin examination  Seborrheic keratosis (2) Left Temple; Right Buccal Cheek   Leave if stable     I, Lavonna Monarch, MD, have reviewed all documentation for this visit.  The documentation on 11/21/19 for the exam, diagnosis, procedures, and orders are all accurate and complete.

## 2019-11-26 ENCOUNTER — Ambulatory Visit: Payer: Medicare Other | Attending: Internal Medicine

## 2019-11-26 ENCOUNTER — Other Ambulatory Visit: Payer: Self-pay

## 2019-11-26 DIAGNOSIS — R293 Abnormal posture: Secondary | ICD-10-CM

## 2019-11-26 DIAGNOSIS — Z9181 History of falling: Secondary | ICD-10-CM | POA: Diagnosis not present

## 2019-11-26 DIAGNOSIS — R2689 Other abnormalities of gait and mobility: Secondary | ICD-10-CM

## 2019-11-26 DIAGNOSIS — M6281 Muscle weakness (generalized): Secondary | ICD-10-CM

## 2019-11-26 DIAGNOSIS — R2681 Unsteadiness on feet: Secondary | ICD-10-CM | POA: Diagnosis not present

## 2019-11-26 NOTE — Therapy (Signed)
[place] [address] [place], [place], [address] Phone: [phone]   Fax:  [phone]  Physical Therapy Evaluation  Patient Details  Name: DIAN MINAHAN MRN: [id number] Date of Birth: 27-Nov-1945 Referring Provider (PT): Referred by: Delrae Rend, MD. PCP is Josetta Huddle, MD   Encounter Date: 11/26/2019   PT End of Session - 11/26/19 0927    Visit Number 1    Number of Visits 13    Date for PT Re-Evaluation 01/25/20   POC for 6 weeks, Cert for 60 days   Authorization Type Medicare (10th visit PN)    Progress Note Due on Visit 10    PT Start Time 0845    PT Stop Time 0927    PT Time Calculation (min) 42 min    Equipment Utilized During Treatment Gait belt    Activity Tolerance Patient tolerated treatment well    Behavior During Therapy [facility] for tasks assessed/performed           Past Medical History:  Diagnosis Date  . ADHD (attention deficit hyperactivity disorder)   . B12 deficiency   . BPH (benign prostatic hypertrophy)   . 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 Coronary artery disease CARDIOLOGIST- DR Daneen Schick   S/P STENTING LAD 1999 // Myoview 01/2019: EF 62, normal perfusion; Low Risk  . Crohn's disease of ileum ([place]) SINCE 1988  . ED (erectile dysfunction)   . Elevated PSA   . Essential and other specified forms of tremor 11/25/2012  . H/O adenomatous polyp of colon   . Hyperlipidemia   . Nocturia   . OA (osteoarthritis)   . Peripheral neuropathy    hx of, none current as of 08-04-13  . Peyronie disease   . S/P coronary artery stent placement OCT 1999 OF LAD  . Tremor, hereditary, benign MILD RIGHT HAND    Past Surgical History:  Procedure Laterality Date  . CARDIOVASCULAR STRESS TEST  11-01-2010 DR Daneen Schick   NORMAL PERFUSION STUDY/ EF 64%/ NO ISCHEMIA  . cataract surgery   Bilateral feburary 2020   with lens placement   . COLONOSCOPY WITH PROPOFOL N/A 09/24/2012   Procedure: COLONOSCOPY WITH PROPOFOL;  Surgeon: [name], MD;  Location: [facility];  Service: Endoscopy;  Laterality: N/A;  . CORONARY ANGIOPLASTY WITH STENT PLACEMENT  OCT 1999   STENT OF LAD  . CORONARY STENT INTERVENTION N/A 02/14/2019   Procedure: CORONARY STENT INTERVENTION;  Surgeon: Belva Crome, MD;  Location: [place] [facility];  Service: Cardiovascular;  Laterality: N/A;  . CYSTOSCOPY WITH URETHRAL DILATATION  03/12/2017   Procedure: CYSTOSCOPY WITH URETHRAL DILATATION;  Surgeon: Gaynelle Arabian, MD;  Location: [facility];  Service: Orthopedics;;  Ammie Dalton, Resident Assisting  . CYSTOSCOPY WITH URETHRAL DILATATION N/A 10/03/2018   Procedure: CYSTOSCOPY WITH URETHRAL BALLOON DILATATION WITH BILATERAL RETROGRADE PYELOGRAPHY;  Surgeon: Raynelle Bring, MD;  Location: [facility];  Service: Urology;  Laterality: N/A;  . LAPAROSCOPIC INGUINAL HERNIA REPAIR Bilateral 01-10-2004   W/ MESH  . LEFT HEART CATH AND CORONARY ANGIOGRAPHY N/A 02/13/2019   Procedure: LEFT HEART CATH AND CORONARY ANGIOGRAPHY;  Surgeon: Belva Crome, MD;  Location: [place] [facility];  Service: Cardiovascular;  Laterality: N/A;  . neck benign removed from neck  yrs ago  . PROSTATE BIOPSY N/A 07/12/2012   Procedure: PROSTATE BIOPSY AND ULTRASOUND;  Surgeon:  Ailene Rud, MD;  Location: [facility];  Service: Urology;  Laterality: N/A;  . PROSTATE SURGERY  2002   tuna  . REMOVAL LEFT NECK LYMPH NODE  1996  . TONSILLECTOMY  CHILD  . TOTAL KNEE ARTHROPLASTY Right 03/12/2017   Procedure: RIGHT TOTAL KNEE ARTHROPLASTY;  Surgeon: Gaynelle Arabian, MD;  Location: [facility];  Service: Orthopedics;  Laterality: Right;  Adductor Block  . TRANSURETHRAL RESECTION OF PROSTATE N/A 08/08/2013   Procedure: TRANSURETHRAL RESECTION OF THE PROSTATE WITH GYRUS INSTRUMENTS;  Surgeon: Ailene Rud, MD;  Location: [facility];   Service: Urology;  Laterality: N/A;  . TRANSURETHRAL RESECTION OF PROSTATE      There were no vitals filed for this visit.    Subjective Assessment - 11/26/19 0850    Subjective Patient reports that he has began to have balance problems that has gradually worsened. Patient reports that he has had 3 falls in the past year. One was carrying an object, the other two was when walking the dog. Does not currently using any AD at this time, and reports he does not want too. Patient does have some back pain, but none this morning. Patient reports that he feels clumsy, and somtimes furniture walks. Patient reports that goes to gym for exercise, 5x/week.    Pertinent History ADHD. B12 Deficiency, BPH, Chronic Lymphocystic Leukemia, Crohn's Disease, CAD, Benign Tremor, Hyperlipidemia, Osteoporosis    Limitations Walking    Patient Stated Goals Be able to feel secure    Currently in Pain? No/denies              [facility] PT Assessment - 11/26/19 0001      Assessment   Medical Diagnosis Falls/Imbalance    Referring Provider (PT) Referred by: Delrae Rend, MD. PCP is Josetta Huddle, MD    Onset Date/Surgical Date 11/21/19   Date of Referral   Hand Dominance Right    Prior Therapy Prior PT for Knee Replacement      Precautions   Precautions Fall      Balance Screen   Has the patient fallen in the past 6 months Yes    How many times? 1    Has the patient had a decrease in activity level because of a fear of falling?  No    Is the patient reluctant to leave their home because of a fear of falling?  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      ROM / Strength   AROM / PROM / Strength AROM;Strength      AROM   Overall AROM  Within functional limits for tasks performed    Overall AROM Comments for BLE's      Strength   Overall Strength Deficits    Strength Assessment Site Hip;Knee;Ankle    Right/Left Hip Right;Left    Right Hip Flexion 4/5    Right Hip ABduction 4/5    Right Hip ADduction 4+/5    Left Hip Flexion 4/5    Left Hip ABduction 4/5    Left Hip ADduction 4+/5    Right/Left Knee Right;Left    Right Knee Flexion 4+/5    Right Knee Extension 4+/5    Left Knee Flexion 4+/5    Left Knee Extension 4+/5    Right/Left Ankle Right;Left    Right Ankle Dorsiflexion 4/5    Left Ankle Dorsiflexion 4/5      Transfers   Transfers Sit to Stand;Stand to Sit    Sit to Stand 5: Supervision    Five time sit to stand comments  12.59 secs w/o UE support, from standard chair    Stand to Sit 5: Supervision      Ambulation/Gait   Ambulation/Gait Yes    Ambulation/Gait Assistance 5: Supervision;4: Min guard    Ambulation/Gait Assistance Details patient ambulating without AD, supervision into/out of therapy session. Min Guard for ambulation with added balance challenge. Patient demonstrates ambulating  with narrow BOS    Ambulation Distance (Feet) 115 Feet    Assistive device None    Gait Pattern Step-through pattern;Narrow base of support    Ambulation Surface Level;Indoor    Gait velocity 9.44 secs = 3.47 ft/sec    Stairs Yes    Stairs Assistance 5: Supervision    Stairs Assistance Details (indicate cue type and reason) ascend/descend stairs with single rail and alternating pattern, no instances of imbalance noted with use of handrail.     Stair Management Technique One rail Right;Alternating pattern;Forwards    Number of Stairs 8    Height of Stairs 6      Standardized Balance Assessment   Standardized Balance Assessment Timed Up and Go Test      Timed Up and Go Test   TUG Normal TUG    Normal TUG (seconds) 11.41    TUG Comments completed w/o AD      Functional Gait  Assessment   Gait assessed  Yes    Gait Level Surface Walks 20 ft in less than 7 sec but greater than 5.5 sec, uses assistive device, slower speed, mild gait deviations, or deviates 6-10 in outside of the 12 in walkway width.    Change in Gait Speed Able to change speed, demonstrates mild gait deviations, deviates 6-10 in outside of the 12 in walkway width, or no gait deviations, unable to achieve a major change in velocity, or uses a change in velocity, or uses an assistive device.    Gait with Horizontal Head Turns Performs head turns smoothly with slight change in gait velocity (eg, minor disruption to smooth gait path), deviates 6-10 in outside 12 in walkway width, or uses an assistive device.    Gait with Vertical Head Turns Performs task with slight change in gait velocity (eg, minor  disruption to smooth gait path), deviates 6 - 10 in outside 12 in walkway width or uses assistive device    Gait and Pivot Turn Pivot turns safely in greater than 3 sec and stops with no loss of balance, or pivot turns safely within 3 sec and stops with mild imbalance, requires small steps to catch balance.    Step Over Obstacle Is  able to step over one shoe box (4.5 in total height) without changing gait speed. No evidence of imbalance.    Gait with Narrow Base of Support Ambulates 4-7 steps.    Gait with Eyes Closed Walks 20 ft, slow speed, abnormal gait pattern, evidence for imbalance, deviates 10-15 in outside 12 in walkway width. Requires more than 9 sec to ambulate 20 ft.    Ambulating Backwards Walks 20 ft, slow speed, abnormal gait pattern, evidence for imbalance, deviates 10-15 in outside 12 in walkway width.    Steps Alternating feet, must use rail.    Total Score 17    FGA comment: 17/30: High Fall Risk                      Objective measurements completed on examination: See above findings.               PT Education - 11/26/19 1119    Education Details Patient educated on evaluation findings, FGA score, and POC    Person(s) Educated Patient    Methods Explanation    Comprehension Verbalized understanding            PT Short Term Goals - 11/26/19 1129      PT SHORT TERM GOAL #1   Title Patient will be independent with initial balance HEP (ALL STG's Due: 12/24/2019)    Baseline no HEP established    Time 3    Period Weeks    Status New    Target Date 12/24/19   due to delay in scheduling     PT SHORT TERM GOAL #2   Title Patient will completed 5x sit <> stand in </= 10 seconds without UE support to demo improved functional strength    Baseline 12.59 secs    Time 3    Period Weeks    Status New      PT SHORT TERM GOAL #3   Title Patient will improve FGA to >/= 19/30 to demonstrate reduced fall risk    Baseline 17/30    Time 3    Period Weeks    Status New             PT Long Term Goals - 11/26/19 1131      PT LONG TERM GOAL #1   Title Patient will be independent with final HEP for balance/strengthening (ALL LTGs Due: 01/14/20)    Baseline no HEP established    Time 6    Period Weeks    Status New    Target Date 01/14/20      PT LONG TERM GOAL #2    Title Patient will improve FGA to >/= 22/30 to demonstrate improved balance and reduced risk for falls    Baseline 17/30    Time 6    Period Weeks    Status New      PT LONG TERM GOAL #3   Title Patient will complete TUG in </= 10 seconds to demonstrate reduced risk for falls    Baseline 11.41 secs    Time 6    Period  Weeks    Status New      PT LONG TERM GOAL #4   Title Patient will demo ability to ambulate >500 ft on outdoor unlevel surfaces, Mod I to demonstrate improved community mobility    Baseline TBD    Time 6    Period Weeks    Status New                  Plan - 11/26/19 1124    Clinical Impression Statement Patient is a 74 y.o. male that was referred to Neuro OPPT services for history of falling and imbalance. Patient's PMH is significant for the following: ADHD. B12 Deficiency, BPH, Chronic Lymphocystic Leukemia, Crohn's Disease, CAD, Benign Tremor, Hyperlipidemia, and Osteoporosis. Upon evaluation, patient presents with the following impairments: abnormal posture, impaired balance, decreased strength, abnormal gait, and increased risk for falls. With TUG and FGA score patient is deemed at increased risk for falls. Patient will benefit ftrom skilled PT services to reduce fall risk and maximize functional mobility.    Personal Factors and Comorbidities Comorbidity 3+    Comorbidities ADHD. B12 Deficiency, BPH, Chronic Lymphocystic Leukemia, Crohn's Disease, CAD, Benign Tremor, Hyperlipidemia, Osteoperosis    Examination-Activity Limitations Stairs;Stand;Locomotion Level    Examination-Participation Restrictions Community Activity;Yard Work;Occupation    Stability/Clinical Decision Making Stable/Uncomplicated    Clinical Decision Making Low    Rehab Potential Good    PT Frequency 2x / week    PT Duration 6 weeks    PT Treatment/Interventions ADLs/Self Care Home Management;Cryotherapy;Moist Heat;DME Instruction;Stair training;Functional mobility training;Therapeutic  activities;Gait training;Therapeutic exercise;Balance training;Neuromuscular re-education;Patient/family education;Orthotic Fit/Training;Manual techniques;Passive range of motion;Vestibular    PT Next Visit Plan Initiate Balance HEP    Consulted and Agree with Plan of Care Patient           Patient will benefit from skilled therapeutic intervention in order to improve the following deficits and impairments:  Abnormal gait, Decreased balance, Difficulty walking, Postural dysfunction, Pain, Decreased strength, Decreased activity tolerance, Improper body mechanics  Visit Diagnosis: History of falling  Unsteadiness on feet  Abnormal posture  Other abnormalities of gait and mobility  Muscle weakness (generalized)     Problem List Patient Active Problem List   Diagnosis Date Noted  . Angina pectoris ([place]) 02/13/2019  . Pseudophakia of both eyes 08/01/2018  . History of total knee arthroplasty 03/29/2017  . Stiffness of right knee 03/16/2017  . OA (osteoarthritis) of knee 03/12/2017  . Tremor, essential 12/03/2015  . Essential hypertension 12/09/2014  . Coronary artery disease involving native heart 12/08/2013  . 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[place]) 12/08/2013  . Hyperlipidemia 12/08/2013  . Benign prostatic hypertrophy 08/08/2013  . Essential and other specified forms of tremor 11/25/2012    Jones Bales, PT, DPT 11/26/2019, 11:33 AM  [facility] [address] [place], [place], [address] Phone: [phone]   Fax:  [phone]  Name: MIZAEL SAGAR MRN: [id number] Date of Birth: 03-26-1945

## 2019-12-08 ENCOUNTER — Other Ambulatory Visit (HOSPITAL_COMMUNITY): Payer: Self-pay | Admitting: Internal Medicine

## 2019-12-08 DIAGNOSIS — C9110 Chronic lymphocytic leukemia of B-cell type not having achieved remission: Secondary | ICD-10-CM | POA: Diagnosis not present

## 2019-12-08 DIAGNOSIS — R7303 Prediabetes: Secondary | ICD-10-CM | POA: Diagnosis not present

## 2019-12-08 DIAGNOSIS — K219 Gastro-esophageal reflux disease without esophagitis: Secondary | ICD-10-CM | POA: Diagnosis not present

## 2019-12-08 DIAGNOSIS — R6882 Decreased libido: Secondary | ICD-10-CM | POA: Diagnosis not present

## 2019-12-08 DIAGNOSIS — E230 Hypopituitarism: Secondary | ICD-10-CM | POA: Diagnosis not present

## 2019-12-08 DIAGNOSIS — E538 Deficiency of other specified B group vitamins: Secondary | ICD-10-CM | POA: Diagnosis not present

## 2019-12-08 DIAGNOSIS — Z0001 Encounter for general adult medical examination with abnormal findings: Secondary | ICD-10-CM | POA: Diagnosis not present

## 2019-12-08 DIAGNOSIS — Z0000 Encounter for general adult medical examination without abnormal findings: Secondary | ICD-10-CM | POA: Diagnosis not present

## 2019-12-08 DIAGNOSIS — H6120 Impacted cerumen, unspecified ear: Secondary | ICD-10-CM | POA: Diagnosis not present

## 2019-12-08 DIAGNOSIS — M8580 Other specified disorders of bone density and structure, unspecified site: Secondary | ICD-10-CM | POA: Diagnosis not present

## 2019-12-08 DIAGNOSIS — E7800 Pure hypercholesterolemia, unspecified: Secondary | ICD-10-CM | POA: Diagnosis not present

## 2019-12-08 DIAGNOSIS — N400 Enlarged prostate without lower urinary tract symptoms: Secondary | ICD-10-CM | POA: Diagnosis not present

## 2019-12-08 DIAGNOSIS — I10 Essential (primary) hypertension: Secondary | ICD-10-CM | POA: Diagnosis not present

## 2019-12-08 DIAGNOSIS — I2510 Atherosclerotic heart disease of native coronary artery without angina pectoris: Secondary | ICD-10-CM | POA: Diagnosis not present

## 2019-12-08 DIAGNOSIS — R296 Repeated falls: Secondary | ICD-10-CM | POA: Diagnosis not present

## 2019-12-09 ENCOUNTER — Encounter: Payer: Self-pay | Admitting: Dermatology

## 2019-12-09 ENCOUNTER — Other Ambulatory Visit: Payer: Self-pay

## 2019-12-09 ENCOUNTER — Inpatient Hospital Stay: Payer: Medicare Other

## 2019-12-09 ENCOUNTER — Inpatient Hospital Stay: Payer: Medicare Other | Attending: Oncology | Admitting: Oncology

## 2019-12-09 ENCOUNTER — Telehealth: Payer: Self-pay | Admitting: Oncology

## 2019-12-09 ENCOUNTER — Ambulatory Visit (INDEPENDENT_AMBULATORY_CARE_PROVIDER_SITE_OTHER): Payer: Medicare Other | Admitting: Dermatology

## 2019-12-09 VITALS — BP 134/76 | HR 62 | Temp 97.5°F | Resp 17 | Ht 68.0 in | Wt 179.8 lb

## 2019-12-09 DIAGNOSIS — C9110 Chronic lymphocytic leukemia of B-cell type not having achieved remission: Secondary | ICD-10-CM | POA: Diagnosis not present

## 2019-12-09 DIAGNOSIS — L570 Actinic keratosis: Secondary | ICD-10-CM | POA: Diagnosis not present

## 2019-12-09 DIAGNOSIS — I25119 Atherosclerotic heart disease of native coronary artery with unspecified angina pectoris: Secondary | ICD-10-CM | POA: Diagnosis not present

## 2019-12-09 DIAGNOSIS — D489 Neoplasm of uncertain behavior, unspecified: Secondary | ICD-10-CM

## 2019-12-09 DIAGNOSIS — D485 Neoplasm of uncertain behavior of skin: Secondary | ICD-10-CM

## 2019-12-09 DIAGNOSIS — L820 Inflamed seborrheic keratosis: Secondary | ICD-10-CM | POA: Diagnosis not present

## 2019-12-09 LAB — CBC WITH DIFFERENTIAL (CANCER CENTER ONLY)
Abs Immature Granulocytes: 0.01 10*3/uL (ref 0.00–0.07)
Basophils Absolute: 0.1 10*3/uL (ref 0.0–0.1)
Basophils Relative: 1 %
Eosinophils Absolute: 0.2 10*3/uL (ref 0.0–0.5)
Eosinophils Relative: 3 %
HCT: 43.7 % (ref 39.0–52.0)
Hemoglobin: 14.5 g/dL (ref 13.0–17.0)
Immature Granulocytes: 0 %
Lymphocytes Relative: 23 %
Lymphs Abs: 1.8 10*3/uL (ref 0.7–4.0)
MCH: 30 pg (ref 26.0–34.0)
MCHC: 33.2 g/dL (ref 30.0–36.0)
MCV: 90.5 fL (ref 80.0–100.0)
Monocytes Absolute: 0.9 10*3/uL (ref 0.1–1.0)
Monocytes Relative: 12 %
Neutro Abs: 4.8 10*3/uL (ref 1.7–7.7)
Neutrophils Relative %: 61 %
Platelet Count: 232 10*3/uL (ref 150–400)
RBC: 4.83 MIL/uL (ref 4.22–5.81)
RDW: 13.9 % (ref 11.5–15.5)
WBC Count: 7.8 10*3/uL (ref 4.0–10.5)
nRBC: 0 % (ref 0.0–0.2)

## 2019-12-09 NOTE — Patient Instructions (Signed)

## 2019-12-09 NOTE — Telephone Encounter (Signed)
Scheduled appointments per 10/5 los. Patient is aware of appointments. Gave patient updated calendar.

## 2019-12-09 NOTE — Progress Notes (Signed)
ln2 scalp x1

## 2019-12-09 NOTE — Progress Notes (Signed)
  [place] OFFICE PROGRESS NOTE   Diagnosis: CLL  INTERVAL HISTORY:   Mr. Kelsay returns for a scheduled visit.  He feels well.  No recent infection.  No fever or night sweats.  No palpable lymph nodes.  He has received the Covid booster vaccine.  He plans to get an influenza vaccine this fall.  Objective:  Vital signs in last 24 hours:  Blood pressure 134/76, pulse 62, temperature (!) 97.5 F (36.4 C), temperature source Tympanic, resp. rate 17, height 5' 8"  (1.727 m), weight 179 lb 12.8 oz (81.6 kg), SpO2 98 %.    Lymphatics: No cervical, supraclavicular, axillary, or inguinal nodes Resp: Lungs clear bilaterally Cardio: Regular rate and rhythm GI: No hepatosplenomegaly, no mass, nontender Vascular: No leg edema, bilateral lower leg varicosities with chronic stasis change     Lab Results:  Lab Results  Component Value Date   WBC 7.8 12/09/2019   HGB 14.5 12/09/2019   HCT 43.7 12/09/2019   MCV 90.5 12/09/2019   PLT 232 12/09/2019   NEUTROABS 4.8 12/09/2019     Medications: I have reviewed the patient's current medications.   Assessment/Plan: 1. Chronic lymphocytic leukemia, diagnosed in 1996. He remains asymptomatic and stable from a hematologic standpoint. 2. pneumococcal vaccine given on 11/30/2015 , 13 valent pneumonia vaccine given 11/13/2013    Disposition: Mr. Simien is stable from a hematologic standpoint.  There is no clinical evidence for progression of the CLL.  He would like to continue follow-up in the oncology clinic.  He will return for an office visit and 23 valent pneumococcal vaccine in 1 year.  He will seek medical attention for symptoms of an infection, including COVID-19 symptoms.  We discussed the potential for a decreased response to the COVID-19 vaccine in the setting of CLL.  Betsy Coder, MD  12/09/2019  3:42 PM

## 2019-12-10 ENCOUNTER — Ambulatory Visit: Payer: Medicare Other | Attending: Internal Medicine

## 2019-12-10 DIAGNOSIS — R2681 Unsteadiness on feet: Secondary | ICD-10-CM

## 2019-12-10 DIAGNOSIS — Z9181 History of falling: Secondary | ICD-10-CM

## 2019-12-10 DIAGNOSIS — R293 Abnormal posture: Secondary | ICD-10-CM

## 2019-12-10 DIAGNOSIS — R2689 Other abnormalities of gait and mobility: Secondary | ICD-10-CM | POA: Diagnosis not present

## 2019-12-10 DIAGNOSIS — M6281 Muscle weakness (generalized): Secondary | ICD-10-CM | POA: Diagnosis not present

## 2019-12-10 NOTE — Therapy (Signed)
[facility] [address] [place], [place], [address] Phone: [phone]   Fax:  [phone]  Physical Therapy Treatment  Patient Details  Name: Cole Martinez MRN: [id number] Date of Birth: [birth year] Referring Provider (PT): Referred by: Delrae Rend, MD. PCP is Josetta Huddle, MD   Encounter Date: 12/10/2019   PT End of Session - 12/10/19 1107    Visit Number 2    Number of Visits 13    Date for PT Re-Evaluation 01/25/20   POC for 6 weeks, Cert for 60 days   Authorization Type Medicare (10th visit PN)    Progress Note Due on Visit 10    PT Start Time 1104    PT Stop Time 5374    PT Time Calculation (min) 41 min    Equipment Utilized During Treatment Gait belt    Activity Tolerance Patient tolerated treatment well    Behavior During Therapy WFL for tasks assessed/performed           Past Medical History:  Diagnosis Date   ADHD (attention deficit hyperactivity disorder)    B12 deficiency    BPH (benign prostatic hypertrophy)    Chronic lymphocytic leukemia (CLL), T-cell ([place]) [place]--  ONCOLOGIST-  DR Benay Spice   PT IS ASYMPTOMATIC--- LAST CBC W/ DIFF 06-24-2012 STABLE   Coronary artery disease CARDIOLOGIST- DR Daneen Schick   S/P STENTING LAD 1999 // Myoview 01/2019: EF 62, normal perfusion; Low Risk   Crohn's disease of ileum ([place]) [place]   ED (erectile dysfunction)    Elevated PSA    Essential and other specified forms of tremor 11/25/2012   H/O adenomatous polyp of colon    Hyperlipidemia    Nocturia    OA (osteoarthritis)    Peripheral neuropathy    hx of, none current as of 08-04-13   Peyronie disease    S/P coronary artery stent placement OCT 1999 OF LAD   Tremor, hereditary, benign MILD RIGHT HAND    Past Surgical History:  Procedure Laterality Date   CARDIOVASCULAR STRESS TEST  11-01-2010 DR Daneen Schick   NORMAL PERFUSION STUDY/ EF 64%/ NO ISCHEMIA   cataract surgery   Bilateral feburary 2020   with lens placement    COLONOSCOPY WITH PROPOFOL N/A 09/24/2012   Procedure: COLONOSCOPY WITH PROPOFOL;  Surgeon: [name], MD;  [facility];  Service: Endoscopy;  Laterality: N/A;   CORONARY ANGIOPLASTY WITH STENT PLACEMENT  OCT 1999   STENT OF LAD   CORONARY STENT INTERVENTION N/A 02/14/2019   Procedure: CORONARY STENT INTERVENTION;  Surgeon: Belva Crome, MD;  Location: [facility];  Service: Cardiovascular;  Laterality: N/A;   CYSTOSCOPY WITH URETHRAL DILATATION  03/12/2017   Procedure: CYSTOSCOPY WITH URETHRAL DILATATION;  Surgeon: Gaynelle Arabian, MD;  Location: [facility];  Service: Orthopedics;;  Ammie Dalton, Resident Assisting   CYSTOSCOPY WITH URETHRAL DILATATION N/A 10/03/2018   Procedure: CYSTOSCOPY WITH URETHRAL BALLOON DILATATION WITH BILATERAL RETROGRADE PYELOGRAPHY;  Surgeon: Raynelle Bring, MD;  [facility]: [facility];  Service: Urology;  Laterality: N/A;   LAPAROSCOPIC INGUINAL HERNIA REPAIR Bilateral 01-10-2004   W/ MESH   LEFT HEART CATH AND CORONARY ANGIOGRAPHY N/A 02/13/2019   Procedure: LEFT HEART CATH AND CORONARY ANGIOGRAPHY;  Surgeon: Belva Crome, MD;  Location: [facility];  Service: Cardiovascular;  Laterality: N/A;   neck benign removed from neck  yrs ago   PROSTATE BIOPSY N/A 07/12/2012   Procedure: PROSTATE BIOPSY AND ULTRASOUND;  Surgeon:  Ailene Rud, MD;  Location: [facility];  Service: Urology;  Laterality: N/A;   PROSTATE SURGERY  2002   tuna   REMOVAL LEFT NECK LYMPH NODE  1996   TONSILLECTOMY  CHILD   TOTAL KNEE ARTHROPLASTY Right 03/12/2017   Procedure: RIGHT TOTAL KNEE ARTHROPLASTY;  Surgeon: Gaynelle Arabian, MD;  [facility]: [facility];  Service: Orthopedics;  Laterality: Right;  Adductor Block   TRANSURETHRAL RESECTION OF PROSTATE N/A 08/08/2013   Procedure: TRANSURETHRAL RESECTION OF THE PROSTATE WITH GYRUS INSTRUMENTS;  Surgeon: Ailene Rud, MD;  [facility];   Service: Urology;  Laterality: N/A;   TRANSURETHRAL RESECTION OF PROSTATE      There were no vitals filed for this visit.   Subjective Assessment - 12/10/19 1105    Subjective Patient reports no changes since initial visit. No falls to report. Still going to the gym as he can with busy schedule.    Pertinent History ADHD. B12 Deficiency, BPH, Chronic Lymphocystic Leukemia, Crohn's Disease, CAD, Benign Tremor, Hyperlipidemia, Osteoporosis    Limitations Walking    Patient Stated Goals Be able to feel secure    Currently in Pain? Yes    Pain Score 4     Pain Location Back    Pain Orientation Lower    Pain Descriptors / Indicators Aching    Pain Type Chronic pain    Pain Onset More than a month ago    Pain Frequency Intermittent             Treatment:  Completed all of the following exercises during session as establishing initial HEP for balance and posture. Patient require verbal cues for proper completion at times, but tolerating all exercises well. Increased challenge noted with vision removed and complaint surfaces. PT educating on completion of all exercises in a corner with chair placed in front for safety, patient verbalizing understanding. PT provided patient with HEP handout, and educating on downloading [place] as patient would like to have HEP on phone for when he goes to the gym.   Access Code: [email or web address] URL: [email or web address] Date: 12/10/2019 Prepared by: Baldomero Lamy  Exercises Romberg Stance with Eyes Closed - 1 x daily - 5 x weekly - 1 sets - 3 reps - 30 hold Feet Apart with Head Nods on Foam Pad - 1 x daily - 5 x weekly - 2 sets - 10 reps Tandem Stance - 1 x daily - 5 x weekly - 1 sets - 3 reps - 30 hold Single Leg Stance with Support - 1 x daily - 5 x weekly - 1 sets - 3 reps - 5-15 secs hold - patient demo increased challenge with SLS on LLE>RLE Tandem Walking with Counter Support - 1 x daily - 5 x weekly - 3 sets - 10 reps Walking  March with Countertop Support - 1 x daily - 5 x weekly - 3 sets - 10 reps - PT educating on counting "1-2" to further promote SLS and use UE support from countertop as needed.  Seated Scapular Retraction - 1 x daily - 5 x weekly - 2 sets - 10 reps                         PT Education - 12/10/19 1153    Education Details Initiated Balance and Posture HEP    Person(s) Educated Patient    Methods Explanation    Comprehension Verbalized understanding  PT Short Term Goals - 11/26/19 1129      PT SHORT TERM GOAL #1   Title Patient will be independent with initial balance HEP (ALL STG's Due: 12/24/2019)    Baseline no HEP established    Time 3    Period Weeks    Status New    Target Date 12/24/19   due to delay in scheduling     PT SHORT TERM GOAL #2   Title Patient will completed 5x sit <> stand in </= 10 seconds without UE support to demo improved functional strength    Baseline 12.59 secs    Time 3    Period Weeks    Status New      PT SHORT TERM GOAL #3   Title Patient will improve FGA to >/= 19/30 to demonstrate reduced fall risk    Baseline 17/30    Time 3    Period Weeks    Status New             PT Long Term Goals - 11/26/19 1131      PT LONG TERM GOAL #1   Title Patient will be independent with final HEP for balance/strengthening (ALL LTGs Due: 01/14/20)    Baseline no HEP established    Time 6    Period Weeks    Status New    Target Date 01/14/20      PT LONG TERM GOAL #2   Title Patient will improve FGA to >/= 22/30 to demonstrate improved balance and reduced risk for falls    Baseline 17/30    Time 6    Period Weeks    Status New      PT LONG TERM GOAL #3   Title Patient will complete TUG in </= 10 seconds to demonstrate reduced risk for falls    Baseline 11.41 secs    Time 6    Period Weeks    Status New      PT LONG TERM GOAL #4   Title Patient will demo ability to ambulate >500 ft on outdoor unlevel surfaces,  Mod I to demonstrate improved community mobility    Baseline TBD    Time 6    Period Weeks    Status New                 Plan - 12/10/19 1154    Clinical Impression Statement Today's skilled PT session included establishing initial HEP for balance and posture. Patient tolerating all exercises well during session, increased challenge noted with vision removed and complaint surfaces. Along with balance/posture HEP, PT encouraging to continue activites at gym and exercise class (chair yoga). Will continue to progress towrad all goals.    Personal Factors and Comorbidities Comorbidity 3+    Comorbidities ADHD. B12 Deficiency, BPH, Chronic Lymphocystic Leukemia, Crohn's Disease, CAD, Benign Tremor, Hyperlipidemia, Osteoperosis    Examination-Activity Limitations Stairs;Stand;Locomotion Level    Examination-Participation Restrictions Community Activity;Yard Work;Occupation    Stability/Clinical Decision Making Stable/Uncomplicated    Rehab Potential Good    PT Frequency 2x / week    PT Duration 6 weeks    PT Treatment/Interventions ADLs/Self Care Home Management;Cryotherapy;Moist Heat;DME Instruction;Stair training;Functional mobility training;Therapeutic activities;Gait training;Therapeutic exercise;Balance training;Neuromuscular re-education;Patient/family education;Orthotic Fit/Training;Manual techniques;Passive range of motion;Vestibular    PT Next Visit Plan How was HEP? Add Cervical Retraction. Continue balance exercises (rockerboard/balance beam)    Consulted and Agree with Plan of Care Patient           Patient will  benefit from skilled therapeutic intervention in order to improve the following deficits and impairments:  Abnormal gait, Decreased balance, Difficulty walking, Postural dysfunction, Pain, Decreased strength, Decreased activity tolerance, Improper body mechanics  Visit Diagnosis: History of falling  Unsteadiness on feet  Abnormal posture  Other abnormalities of  gait and mobility  Muscle weakness (generalized)     Problem List Patient Active Problem List   Diagnosis Date Noted   Angina pectoris ([place]) 02/13/2019   Pseudophakia of both eyes 08/01/2018   History of total knee arthroplasty 03/29/2017   Stiffness of right knee 03/16/2017   OA (osteoarthritis) of knee 03/12/2017   Tremor, essential 12/03/2015   Essential hypertension 12/09/2014   Coronary artery disease involving native heart 12/08/2013   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[place]) 12/08/2013   Hyperlipidemia 12/08/2013   Benign prostatic hypertrophy 08/08/2013   Essential and other specified forms of tremor 11/25/2012    Jones Bales, PT, DPT 12/10/2019, 11:58 AM  [facility] [address] [place], [place], [address] Phone: [phone]   Fax:  [phone]  Name: JAH ALARID MRN: [id number] Date of Birth: 12-May-1945

## 2019-12-10 NOTE — Patient Instructions (Signed)
Access Code: [email or web address] URL: [email or web address] Date: 12/10/2019 Prepared by: Baldomero Lamy  Exercises Romberg Stance with Eyes Closed - 1 x daily - 5 x weekly - 1 sets - 3 reps - 30 hold Feet Apart with Head Nods on Foam Pad - 1 x daily - 5 x weekly - 2 sets - 10 reps Tandem Stance - 1 x daily - 5 x weekly - 1 sets - 3 reps - 30 hold Single Leg Stance with Support - 1 x daily - 5 x weekly - 1 sets - 3 reps - 5-15 secs hold Tandem Walking with Counter Support - 1 x daily - 5 x weekly - 3 sets - 10 reps Walking March with Countertop Support - 1 x daily - 5 x weekly - 3 sets - 10 reps Seated Scapular Retraction - 1 x daily - 5 x weekly - 2 sets - 10 reps

## 2019-12-12 ENCOUNTER — Ambulatory Visit: Payer: Medicare Other

## 2019-12-12 ENCOUNTER — Other Ambulatory Visit: Payer: Self-pay

## 2019-12-12 DIAGNOSIS — Z9181 History of falling: Secondary | ICD-10-CM | POA: Diagnosis not present

## 2019-12-12 DIAGNOSIS — M6281 Muscle weakness (generalized): Secondary | ICD-10-CM | POA: Diagnosis not present

## 2019-12-12 DIAGNOSIS — R2689 Other abnormalities of gait and mobility: Secondary | ICD-10-CM

## 2019-12-12 DIAGNOSIS — R2681 Unsteadiness on feet: Secondary | ICD-10-CM

## 2019-12-12 DIAGNOSIS — R293 Abnormal posture: Secondary | ICD-10-CM | POA: Diagnosis not present

## 2019-12-12 NOTE — Therapy (Signed)
[name] [address] [place], [place], [address] Phone: [phone]   Fax:  [phone]  Physical Therapy Treatment  Patient Details  Name: Cole Martinez MRN: [id number] Date of Birth: 1945/05/14 Referring Provider (PT): Referred by: Delrae Rend, MD. PCP is Josetta Huddle, MD   Encounter Date: 12/12/2019   PT End of Session - 12/12/19 1030    Visit Number 3    Number of Visits 13    Date for PT Re-Evaluation 01/25/20   POC for 6 weeks, Cert for 60 days   Authorization Type Medicare (10th visit PN)    Progress Note Due on Visit 10    PT Start Time 1017    PT Stop Time 1059    PT Time Calculation (min) 42 min    Equipment Utilized During Treatment Gait belt    Activity Tolerance Patient tolerated treatment well    Behavior During Therapy WFL for tasks assessed/performed           Past Medical History:  Diagnosis Date  . ADHD (attention deficit hyperactivity disorder)   . B12 deficiency   . BPH (benign prostatic hypertrophy)   . Chronic lymphocytic leukemia (CLL), T-cell ([place]) [place] 1996--  ONCOLOGIST-  DR [facility]   PT IS ASYMPTOMATIC--- LAST CBC W/ DIFF 06-24-2012 STABLE  . Coronary artery disease CARDIOLOGIST- DR Daneen Schick   S/P STENTING LAD 1999 // Myoview 01/2019: EF 62, normal perfusion; Low Risk  . Crohn's disease of ileum ([place]) SINCE 1988  . ED (erectile dysfunction)   . Elevated PSA   . Essential and other specified forms of tremor 11/25/2012  . H/O adenomatous polyp of colon   . Hyperlipidemia   . Nocturia   . OA (osteoarthritis)   . Peripheral neuropathy    hx of, none current as of 08-04-13  . Peyronie disease   . S/P coronary artery stent placement OCT 1999 OF LAD  . Tremor, hereditary, benign MILD RIGHT HAND    Past Surgical History:  Procedure Laterality Date  . CARDIOVASCULAR STRESS TEST  11-01-2010 DR Daneen Schick   NORMAL PERFUSION STUDY/ EF 64%/ NO ISCHEMIA  . cataract surgery   Bilateral feburary 2020   with lens placement   . COLONOSCOPY WITH PROPOFOL N/A 09/24/2012   Procedure: COLONOSCOPY WITH PROPOFOL;  Surgeon: [name], MD;  [facility];  Service: Endoscopy;  Laterality: N/A;  . CORONARY ANGIOPLASTY WITH STENT PLACEMENT  OCT 1999   STENT OF LAD  . CORONARY STENT INTERVENTION N/A 02/14/2019   Procedure: CORONARY STENT INTERVENTION;  Surgeon: Belva Crome, MD;  Location: [facility];  Service: Cardiovascular;  Laterality: N/A;  . CYSTOSCOPY WITH URETHRAL DILATATION  03/12/2017   Procedure: CYSTOSCOPY WITH URETHRAL DILATATION;  Surgeon: Gaynelle Arabian, MD;  [facility];  Service: Orthopedics;;  Ammie Dalton, Resident Assisting  . CYSTOSCOPY WITH URETHRAL DILATATION N/A 10/03/2018   Procedure: CYSTOSCOPY WITH URETHRAL BALLOON DILATATION WITH BILATERAL RETROGRADE PYELOGRAPHY;  Surgeon: Raynelle Bring, MD;  [facility];  Service: Urology;  Laterality: N/A;  . LAPAROSCOPIC INGUINAL HERNIA REPAIR Bilateral 01-10-2004   W/ MESH  . LEFT HEART CATH AND CORONARY ANGIOGRAPHY N/A 02/13/2019   Procedure: LEFT HEART CATH AND CORONARY ANGIOGRAPHY;  Surgeon: Belva Crome, MD;  Location: [facility];  Service: Cardiovascular;  Laterality: N/A;  . neck benign removed from neck  yrs ago  . PROSTATE BIOPSY N/A 07/12/2012   Procedure: PROSTATE BIOPSY AND ULTRASOUND;  Surgeon:  Ailene Rud, MD;  Location: [facility];  Service: Urology;  Laterality: N/A;  . PROSTATE SURGERY  2002   tuna  . REMOVAL LEFT NECK LYMPH NODE  1996  . TONSILLECTOMY  CHILD  . TOTAL KNEE ARTHROPLASTY Right 03/12/2017   Procedure: RIGHT TOTAL KNEE ARTHROPLASTY;  Surgeon: Gaynelle Arabian, MD;  [facility];  Service: Orthopedics;  Laterality: Right;  Adductor Block  . TRANSURETHRAL RESECTION OF PROSTATE N/A 08/08/2013   Procedure: TRANSURETHRAL RESECTION OF THE PROSTATE WITH GYRUS INSTRUMENTS;  Surgeon: Ailene Rud, MD;  [facility];   Service: Urology;  Laterality: N/A;  . TRANSURETHRAL RESECTION OF PROSTATE      There were no vitals filed for this visit.   Subjective Assessment - 12/12/19 1028    Subjective Patient reports no new changes/complaints since last viist. Did have difficulty with medbridge app and would like help fixing.    Pertinent History ADHD. B12 Deficiency, BPH, Chronic Lymphocystic Leukemia, Crohn's Disease, CAD, Benign Tremor, Hyperlipidemia, Osteoporosis    Limitations Walking    Patient Stated Goals Be able to feel secure    Currently in Pain? No/denies    Pain Onset More than a month ago                             [facility] Adult PT Treatment/Exercise - 12/12/19 0001      Ambulation/Gait   Ambulation/Gait Yes    Ambulation/Gait Assistance 5: Supervision    Ambulation/Gait Assistance Details throughout therapy session with activities    Assistive device None    Gait Pattern Step-through pattern;Narrow base of support    Ambulation Surface Level;Indoor      Self-Care   Self-Care Other Self-Care Comments    Other Self-Care Comments  Patient reporting increased difficulty with use of Medbridge App as it automatically begins counting down and cannot seem to keep up. PT educating on use of webpage with PT providing assistance to patient to pull up on phone, and put in access code. Webpage form of this allows for improved instructions on screen and allow for time to complete exercises appropriately. Educated patient to try this and let us know if this improves compliance.       Exercises   Exercises Other Exercises    Other Exercises  Completed posture exercises as added to HEP, including: cervical retraction x 10 reps with 3 second hold, PT providing verbal cues/demo for proper completion, as well as educating on use of hand to provide pressure into position for proper completion. Completed Pec Stretch in Door Frame with verbal/tactile cues for proper completion, 3  x 30 seconds.                  Balance Exercises - 12/12/19 0001      Balance Exercises: Standing   Rockerboard Anterior/posterior;Lateral;Head turns;Intermittent UE support;Limitations    Rockerboard Limitations standing on rockerboard positioned anterior, completed static standing x 1 minute with focus on holding steady, followed by completion of vertical head turns x 10 reps. Progressed to completing activites on board positioned laterally, with focus on holding steady and then addition of horizontal head turns x 10 reps. Completed lateral weight shifts to R/L without UE support focused on improved control with completion.      Balance Beam Standing across blue balance beam, completed with feet shoulder width apart holding steady x 1 minute, followed by progression to eyes closed 3 x 20-25 seconds.  increased challenge with vision removed, often with posterior LOB requiring UE support and CGA from PT.     Other Standing Exercises Standing on airex without UE support, completed alternating toe taps to pebbles x 15 reps without UE support. Increased challenge noted at times require intermittent UE support and CGA from PT. verbal cues for light tap vs. placing weight on pebble.             PT Education - 12/12/19 1057    Education Details HEP Update (see patient instructions)    Person(s) Educated Patient    Methods Explanation;Demonstration;Handout    Comprehension Verbalized understanding;Returned demonstration            PT Short Term Goals - 11/26/19 1129      PT SHORT TERM GOAL #1   Title Patient will be independent with initial balance HEP (ALL STG's Due: 12/24/2019)    Baseline no HEP established    Time 3    Period Weeks    Status New    Target Date 12/24/19   due to delay in scheduling     PT SHORT TERM GOAL #2   Title Patient will completed 5x sit <> stand in </= 10 seconds without UE support to demo improved functional strength    Baseline 12.59 secs    Time 3    Period Weeks     Status New      PT SHORT TERM GOAL #3   Title Patient will improve FGA to >/= 19/30 to demonstrate reduced fall risk    Baseline 17/30    Time 3    Period Weeks    Status New             PT Long Term Goals - 11/26/19 1131      PT LONG TERM GOAL #1   Title Patient will be independent with final HEP for balance/strengthening (ALL LTGs Due: 01/14/20)    Baseline no HEP established    Time 6    Period Weeks    Status New    Target Date 01/14/20      PT LONG TERM GOAL #2   Title Patient will improve FGA to >/= 22/30 to demonstrate improved balance and reduced risk for falls    Baseline 17/30    Time 6    Period Weeks    Status New      PT LONG TERM GOAL #3   Title Patient will complete TUG in </= 10 seconds to demonstrate reduced risk for falls    Baseline 11.41 secs    Time 6    Period Weeks    Status New      PT LONG TERM GOAL #4   Title Patient will demo ability to ambulate >500 ft on outdoor unlevel surfaces, Mod I to demonstrate improved community mobility    Baseline TBD    Time 6    Period Weeks    Status New                 Plan - 12/12/19 1200    Clinical Impression Statement Today's skilled PT session included further additions to HEP to promote improved posture. Continued progression of balance exercises as tolerated throughout session, continue to demo increased balance challenge with vision removed with frequent posterior LOB requiring CGA and UE support. Will continue to progress toward all goals.    Personal Factors and Comorbidities Comorbidity 3+    Comorbidities ADHD. B12 Deficiency, BPH, Chronic Lymphocystic Leukemia,  Crohn's Disease, CAD, Benign Tremor, Hyperlipidemia, Osteoperosis    Examination-Activity Limitations Stairs;Stand;Locomotion Level    Examination-Participation Restrictions Community Activity;Yard Work;Occupation    Stability/Clinical Decision Making Stable/Uncomplicated    Rehab Potential Good    PT Frequency 2x / week     PT Duration 6 weeks    PT Treatment/Interventions ADLs/Self Care Home Management;Cryotherapy;Moist Heat;DME Instruction;Stair training;Functional mobility training;Therapeutic activities;Gait training;Therapeutic exercise;Balance training;Neuromuscular re-education;Patient/family education;Orthotic Fit/Training;Manual techniques;Passive range of motion;Vestibular    PT Next Visit Plan How did webpage work for exercises? how was additions to HEP?  Continue balance exercises (rockerboard/balance beam). Walking with head turns    Consulted and Agree with Plan of Care Patient           Patient will benefit from skilled therapeutic intervention in order to improve the following deficits and impairments:  Abnormal gait, Decreased balance, Difficulty walking, Postural dysfunction, Pain, Decreased strength, Decreased activity tolerance, Improper body mechanics  Visit Diagnosis: History of falling  Abnormal posture  Other abnormalities of gait and mobility  Unsteadiness on feet  Muscle weakness (generalized)     Problem List Patient Active Problem List   Diagnosis Date Noted  . Angina pectoris ([place]) 02/13/2019  . Pseudophakia of both eyes 08/01/2018  . History of total knee arthroplasty 03/29/2017  . Stiffness of right knee 03/16/2017  . OA (osteoarthritis) of knee 03/12/2017  . Tremor, essential 12/03/2015  . Essential hypertension 12/09/2014  . Coronary artery disease involving native heart 12/08/2013  . 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[place]) 12/08/2013  . Hyperlipidemia 12/08/2013  . Benign prostatic hypertrophy 08/08/2013  . Essential and other specified forms of tremor 11/25/2012    Jones Bales, PT, DPT 12/12/2019, 12:03 PM  [facility] [address] [place], [place], [address] Phone: [phone]   Fax:  [phone]  Name: JARICK HARKINS MRN: [id number] Date of Birth: 1945/07/24

## 2019-12-12 NOTE — Patient Instructions (Signed)
Access Code: [email or web address] URL: [email or web address] Date: 12/12/2019 Prepared by: Baldomero Lamy  Exercises Romberg Stance with Eyes Closed - 1 x daily - 5 x weekly - 1 sets - 3 reps - 30 hold Feet Apart with Head Nods on Foam Pad - 1 x daily - 5 x weekly - 2 sets - 10 reps Tandem Stance - 1 x daily - 5 x weekly - 1 sets - 3 reps - 30 hold Single Leg Stance with Support - 1 x daily - 5 x weekly - 1 sets - 3 reps - 5-15 secs hold Tandem Walking with Counter Support - 1 x daily - 5 x weekly - 3 sets - 10 reps Walking March with Countertop Support - 1 x daily - 5 x weekly - 3 sets - 10 reps Seated Scapular Retraction - 1 x daily - 5 x weekly - 2 sets - 10 reps Seated Cervical Retraction - 1 x daily - 5 x weekly - 2 sets - 10 reps - 3 second hold Doorway Pec Stretch at 60 Elevation - 1 x daily - 5 x weekly - 1 sets - 3 reps - 30 hold

## 2019-12-16 ENCOUNTER — Other Ambulatory Visit: Payer: Self-pay

## 2019-12-16 ENCOUNTER — Ambulatory Visit: Payer: Medicare Other | Admitting: Physical Therapy

## 2019-12-16 DIAGNOSIS — R2689 Other abnormalities of gait and mobility: Secondary | ICD-10-CM | POA: Diagnosis not present

## 2019-12-16 DIAGNOSIS — M6281 Muscle weakness (generalized): Secondary | ICD-10-CM | POA: Diagnosis not present

## 2019-12-16 DIAGNOSIS — R2681 Unsteadiness on feet: Secondary | ICD-10-CM | POA: Diagnosis not present

## 2019-12-16 DIAGNOSIS — R293 Abnormal posture: Secondary | ICD-10-CM

## 2019-12-16 DIAGNOSIS — Z9181 History of falling: Secondary | ICD-10-CM | POA: Diagnosis not present

## 2019-12-16 NOTE — Therapy (Signed)
[facility] [address] [place], [place], [address] Phone: [phone]   Fax:  [phone]  Physical Therapy Treatment  Patient Details  Name: Cole Martinez MRN: [id number] Date of Birth: 03/23/1945 Referring Provider (PT): Referred by: Delrae Rend, MD. PCP is Josetta Huddle, MD   Encounter Date: 12/16/2019   PT End of Session - 12/16/19 1318    Visit Number 4    Number of Visits 13    Date for PT Re-Evaluation 01/25/20   POC for 6 weeks, Cert for 60 days   Authorization Type Medicare (10th visit PN)    Progress Note Due on Visit 10    PT Start Time 6950    PT Stop Time 7225    PT Time Calculation (min) 43 min    Equipment Utilized During Treatment Gait belt    Activity Tolerance Patient tolerated treatment well    Behavior During Therapy WFL for tasks assessed/performed           Past Medical History:  Diagnosis Date   ADHD (attention deficit hyperactivity disorder)    B12 deficiency    BPH (benign prostatic hypertrophy)    Chronic lymphocytic leukemia (CLL), 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--  ONCOLOGIST-  DR Benay Spice   PT IS ASYMPTOMATIC--- LAST CBC W/ DIFF 06-24-2012 STABLE   Coronary artery disease CARDIOLOGIST- DR Daneen Schick   S/P STENTING LAD 1999 // Myoview 01/2019: EF 62, normal perfusion; Low Risk   Crohn's disease of ileum ([place]   ED (erectile dysfunction)    Elevated PSA    Essential and other specified forms of tremor 11/25/2012   H/O adenomatous polyp of colon    Hyperlipidemia    Nocturia    OA (osteoarthritis)    Peripheral neuropathy    hx of, none current as of 08-04-13   Peyronie disease    S/P coronary artery stent placement OCT 1999 OF LAD   Tremor, hereditary, benign MILD RIGHT HAND    Past Surgical History:  Procedure Laterality Date   CARDIOVASCULAR STRESS TEST  11-01-2010 DR Daneen Schick   NORMAL PERFUSION STUDY/ EF 64%/ NO ISCHEMIA   cataract surgery   Bilateral feburary 2020   with lens placement    COLONOSCOPY WITH PROPOFOL N/A 09/24/2012   Procedure: COLONOSCOPY WITH PROPOFOL;  Surgeon: [name], MD;  [facility]: [facility];  Service: Endoscopy;  Laterality: N/A;   CORONARY ANGIOPLASTY WITH STENT PLACEMENT  OCT 1999   STENT OF LAD   CORONARY STENT INTERVENTION N/A 02/14/2019   Procedure: CORONARY STENT INTERVENTION;  Surgeon: Belva Crome, MD;  Location: [facility];  Service: Cardiovascular;  Laterality: N/A;   CYSTOSCOPY WITH URETHRAL DILATATION  03/12/2017   Procedure: CYSTOSCOPY WITH URETHRAL DILATATION;  Surgeon: Gaynelle Arabian, MD;  Location: [facility];  Service: Orthopedics;;  Ammie Dalton, Resident Assisting   CYSTOSCOPY WITH URETHRAL DILATATION N/A 10/03/2018   Procedure: CYSTOSCOPY WITH URETHRAL BALLOON DILATATION WITH BILATERAL RETROGRADE PYELOGRAPHY;  Surgeon: Raynelle Bring, MD;  [facility]: [facility];  Service: Urology;  Laterality: N/A;   LAPAROSCOPIC INGUINAL HERNIA REPAIR Bilateral 01-10-2004   W/ MESH   LEFT HEART CATH AND CORONARY ANGIOGRAPHY N/A 02/13/2019   Procedure: LEFT HEART CATH AND CORONARY ANGIOGRAPHY;  Surgeon: Belva Crome, MD;  Location: [facility];  Service: Cardiovascular;  Laterality: N/A;   neck benign removed from neck  yrs ago   PROSTATE BIOPSY N/A 07/12/2012   Procedure: PROSTATE BIOPSY AND ULTRASOUND;  Surgeon:  Ailene Rud, MD;  Location: [facility];  Service: Urology;  Laterality: N/A;   PROSTATE SURGERY  2002   tuna   REMOVAL LEFT NECK LYMPH NODE  1996   TONSILLECTOMY  CHILD   TOTAL KNEE ARTHROPLASTY Right 03/12/2017   Procedure: RIGHT TOTAL KNEE ARTHROPLASTY;  Surgeon: Gaynelle Arabian, MD;  [facility]: [facility];  Service: Orthopedics;  Laterality: Right;  Adductor Block   TRANSURETHRAL RESECTION OF PROSTATE N/A 08/08/2013   Procedure: TRANSURETHRAL RESECTION OF THE PROSTATE WITH GYRUS INSTRUMENTS;  Surgeon: Ailene Rud, MD;  [facility]: [facility];   Service: Urology;  Laterality: N/A;   TRANSURETHRAL RESECTION OF PROSTATE      There were no vitals filed for this visit.   Subjective Assessment - 12/16/19 1236    Subjective Still having a little bit of difficulty with the medbridge app. No falls.    Pertinent History ADHD. B12 Deficiency, BPH, Chronic Lymphocystic Leukemia, Crohn's Disease, CAD, Benign Tremor, Hyperlipidemia, Osteoporosis    Limitations Walking    Patient Stated Goals Be able to feel secure    Currently in Pain? No/denies    Pain Onset More than a month ago                                  Balance Exercises - 12/16/19 0001      Balance Exercises: Standing   Wall Bumps Hip;Eyes opened;Limitations    Wall Bumps Limitations x12 reps on rockerboard in A/P direction, beginning with UE support and then progressing to none, cues for technique and shifting onto toes with upright posture and hip hinge/hip strategy, intermittent need of chair anteriorly for balance    Rockerboard Anterior/posterior;Lateral;Head turns;Intermittent UE support;Limitations    Rockerboard Limitations scapular squeezes while trying to keep board steady x10 reps, keeping board steady: 2  x 10 reps head turns with intermittent min guard for balance and pt needing wall at times for balance    Other Standing Exercises standing on airex at staircase: altenating toe taps to 2nd step for SLS x10 reps B - cues for slowed and controlled, then performing modified SLS with placing one foot on 2nd step and then with alternating arm raises x3 reps B, min guard for balance.           Access Code: [email or web address] URL: [email or web address] Date: 12/16/2019 Prepared by: [place] app with pt at start of session as some exercises were playing for 6 minutes, with therapist updating parameters on sets and reps. Reviewed previous exercises below as HEP  Swapped out seated chin tuck to supine  chin tuck due to pt performing with improved technique. Tried seated chin tuck and even with verbal, demo and tactile cues pt continuing to perform with cervical extension. Trialed in supine instead with pt with much better technique throughout, added to HEP instead of seated.   Seated Scapular Retraction - 1 x daily - 5 x weekly - 2 sets - 10 reps Doorway Pec Stretch at 60 Elevation - 1 x daily - 5 x weekly - 1 sets - 3 reps - 30 hold - needing initial demo cues for technique as pt with tendency to hold BUE too elevated.  Supine Chin Tuck - 1 x daily - 5 x weekly - 2 sets - 10 reps - 3 hold   PT Education - 12/16/19 1317    Education Details reviewed HEP - changed  seated cervical retraction to supine.    Person(s) Educated Patient    Methods Explanation;Demonstration;Verbal cues;Tactile cues    Comprehension Verbalized understanding;Returned demonstration            PT Short Term Goals - 11/26/19 1129      PT SHORT TERM GOAL #1   Title Patient will be independent with initial balance HEP (ALL STG's Due: 12/24/2019)    Baseline no HEP established    Time 3    Period Weeks    Status New    Target Date 12/24/19   due to delay in scheduling     PT SHORT TERM GOAL #2   Title Patient will completed 5x sit <> stand in </= 10 seconds without UE support to demo improved functional strength    Baseline 12.59 secs    Time 3    Period Weeks    Status New      PT SHORT TERM GOAL #3   Title Patient will improve FGA to >/= 19/30 to demonstrate reduced fall risk    Baseline 17/30    Time 3    Period Weeks    Status New             PT Long Term Goals - 11/26/19 1131      PT LONG TERM GOAL #1   Title Patient will be independent with final HEP for balance/strengthening (ALL LTGs Due: 01/14/20)    Baseline no HEP established    Time 6    Period Weeks    Status New    Target Date 01/14/20      PT LONG TERM GOAL #2   Title Patient will improve FGA to >/= 22/30 to demonstrate  improved balance and reduced risk for falls    Baseline 17/30    Time 6    Period Weeks    Status New      PT LONG TERM GOAL #3   Title Patient will complete TUG in </= 10 seconds to demonstrate reduced risk for falls    Baseline 11.41 secs    Time 6    Period Weeks    Status New      PT LONG TERM GOAL #4   Title Patient will demo ability to ambulate >500 ft on outdoor unlevel surfaces, Mod I to demonstrate improved community mobility    Baseline TBD    Time 6    Period Weeks    Status New                 Plan - 12/16/19 1331    Clinical Impression Statement Reviewed pt's HEP additions for posture, exchanged seated cervical retraction for supine due to pt today performing with proper technique. Despite multi-modal cues, in sitting, pt would continue to perform more with cervical extension. Remainder of sesion focused on balance strategies without UE support, pt with tendecy to lose balance posteriorly esp on rockerboard. Will continue to progress towards LTGs.    Personal Factors and Comorbidities Comorbidity 3+    Comorbidities ADHD. B12 Deficiency, BPH, Chronic Lymphocystic Leukemia, Crohn's Disease, CAD, Benign Tremor, Hyperlipidemia, Osteoperosis    Examination-Activity Limitations Stairs;Stand;Locomotion Level    Examination-Participation Restrictions Community Activity;Yard Work;Occupation    Stability/Clinical Decision Making Stable/Uncomplicated    Rehab Potential Good    PT Frequency 2x / week    PT Duration 6 weeks    PT Treatment/Interventions ADLs/Self Care Home Management;Cryotherapy;Moist Heat;DME Instruction;Stair training;Functional mobility training;Therapeutic activities;Gait training;Therapeutic exercise;Balance training;Neuromuscular re-education;Patient/family education;Orthotic Fit/Training;Manual  techniques;Passive range of motion;Vestibular    PT Next Visit Plan how are additions to HEP?   Continue balance exercises (rockerboard/balance beam) and  incorporate postural exercises into balance. Walking with head turns    Consulted and Agree with Plan of Care Patient           Patient will benefit from skilled therapeutic intervention in order to improve the following deficits and impairments:  Abnormal gait, Decreased balance, Difficulty walking, Postural dysfunction, Pain, Decreased strength, Decreased activity tolerance, Improper body mechanics  Visit Diagnosis: History of falling  Abnormal posture  Other abnormalities of gait and mobility  Unsteadiness on feet     Problem List Patient Active Problem List   Diagnosis Date Noted   Angina pectoris ([place]) 02/13/2019   Pseudophakia of both eyes 08/01/2018   History of total knee arthroplasty 03/29/2017   Stiffness of right knee 03/16/2017   OA (osteoarthritis) of knee 03/12/2017   Tremor, essential 12/03/2015   Essential hypertension 12/09/2014   Coronary artery disease involving native heart 12/08/2013   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[place]) 12/08/2013   Hyperlipidemia 12/08/2013   Benign prostatic hypertrophy 08/08/2013   Essential and other specified forms of tremor 11/25/2012    Arliss Journey, PT, DPT  12/16/2019, 1:33 PM  [facility] [address] [place], [place], [address] Phone: [phone]   Fax:  [phone]  Name: Cole Martinez MRN: [id number] Date of Birth: 1945/06/04

## 2019-12-16 NOTE — Patient Instructions (Signed)
Access Code: [email or web address] URL: [email or web address] Date: 12/16/2019 Prepared by: Janann August  Exercises Romberg Stance with Eyes Closed - 1 x daily - 5 x weekly - 1 sets - 3 reps - 30 hold Feet Apart with Head Nods on Foam Pad - 1 x daily - 5 x weekly - 2 sets - 10 reps Tandem Stance - 1 x daily - 5 x weekly - 1 sets - 3 reps - 30 hold Single Leg Stance with Support - 1 x daily - 5 x weekly - 1 sets - 3 reps - 5-15 secs hold Tandem Walking with Counter Support - 1 x daily - 5 x weekly - 3 sets Walking March with Countertop Support - 1 x daily - 5 x weekly - 3 sets Seated Scapular Retraction - 1 x daily - 5 x weekly - 2 sets - 10 reps Doorway Pec Stretch at 60 Elevation - 1 x daily - 5 x weekly - 1 sets - 3 reps - 30 hold Supine Chin Tuck - 1 x daily - 5 x weekly - 2 sets - 10 reps - 3 hold

## 2019-12-18 DIAGNOSIS — Z23 Encounter for immunization: Secondary | ICD-10-CM | POA: Diagnosis not present

## 2019-12-19 ENCOUNTER — Ambulatory Visit: Payer: Medicare Other

## 2019-12-19 ENCOUNTER — Other Ambulatory Visit: Payer: Self-pay | Admitting: Cardiology

## 2019-12-19 ENCOUNTER — Other Ambulatory Visit: Payer: Self-pay

## 2019-12-19 ENCOUNTER — Other Ambulatory Visit: Payer: Self-pay | Admitting: Interventional Cardiology

## 2019-12-19 DIAGNOSIS — M6281 Muscle weakness (generalized): Secondary | ICD-10-CM

## 2019-12-19 DIAGNOSIS — R2681 Unsteadiness on feet: Secondary | ICD-10-CM

## 2019-12-19 DIAGNOSIS — R2689 Other abnormalities of gait and mobility: Secondary | ICD-10-CM

## 2019-12-19 DIAGNOSIS — Z9181 History of falling: Secondary | ICD-10-CM | POA: Diagnosis not present

## 2019-12-19 DIAGNOSIS — R293 Abnormal posture: Secondary | ICD-10-CM

## 2019-12-19 NOTE — Therapy (Signed)
[facility] [address] [place], [place], [address] Phone: [phone]   Fax:  [phone]  Physical Therapy Treatment  Patient Details  Name: Cole Martinez MRN: [id number] Date of Birth: 1946-02-03 Referring Provider (PT): Referred by: Delrae Rend, MD. PCP is Josetta Huddle, MD   Encounter Date: 12/19/2019   PT End of Session - 12/19/19 1019    Visit Number 5    Number of Visits 13    Date for PT Re-Evaluation 01/25/20   POC for 6 weeks, Cert for 60 days   Authorization Type Medicare (10th visit PN)    Progress Note Due on Visit 10    PT Start Time 1017    PT Stop Time 1100    PT Time Calculation (min) 43 min    Equipment Utilized During Treatment Gait belt    Activity Tolerance Patient tolerated treatment well    Behavior During Therapy WFL for tasks assessed/performed           Past Medical History:  Diagnosis Date  . ADHD (attention deficit hyperactivity disorder)   . B12 deficiency   . BPH (benign prostatic hypertrophy)   . 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 Coronary artery disease CARDIOLOGIST- DR Daneen Schick   S/P STENTING LAD 1999 // Myoview 01/2019: EF 62, normal perfusion; Low Risk  . Crohn's disease of ileum ([place]) SINCE 1988  . ED (erectile dysfunction)   . Elevated PSA   . Essential and other specified forms of tremor 11/25/2012  . H/O adenomatous polyp of colon   . Hyperlipidemia   . Nocturia   . OA (osteoarthritis)   . Peripheral neuropathy    hx of, none current as of 08-04-13  . Peyronie disease   . S/P coronary artery stent placement OCT 1999 OF LAD  . Tremor, hereditary, benign MILD RIGHT HAND    Past Surgical History:  Procedure Laterality Date  . CARDIOVASCULAR STRESS TEST  11-01-2010 DR Daneen Schick   NORMAL PERFUSION STUDY/ EF 64%/ NO ISCHEMIA  . cataract surgery   Bilateral feburary 2020   with lens placement   . COLONOSCOPY WITH PROPOFOL N/A 09/24/2012   Procedure: COLONOSCOPY WITH PROPOFOL;  Surgeon: [name], MD;  [facility];  Service: Endoscopy;  Laterality: N/A;  . CORONARY ANGIOPLASTY WITH STENT PLACEMENT  OCT 1999   STENT OF LAD  . CORONARY STENT INTERVENTION N/A 02/14/2019   Procedure: CORONARY STENT INTERVENTION;  Surgeon: Belva Crome, MD;  Location: [facility];  Service: Cardiovascular;  Laterality: N/A;  . CYSTOSCOPY WITH URETHRAL DILATATION  03/12/2017   Procedure: CYSTOSCOPY WITH URETHRAL DILATATION;  Surgeon: Gaynelle Arabian, MD;  [facility];  Service: Orthopedics;;  Ammie Dalton, Resident Assisting  . CYSTOSCOPY WITH URETHRAL DILATATION N/A 10/03/2018   Procedure: CYSTOSCOPY WITH URETHRAL BALLOON DILATATION WITH BILATERAL RETROGRADE PYELOGRAPHY;  Surgeon: Raynelle Bring, MD;  [facility];  Service: Urology;  Laterality: N/A;  . LAPAROSCOPIC INGUINAL HERNIA REPAIR Bilateral 01-10-2004   W/ MESH  . LEFT HEART CATH AND CORONARY ANGIOGRAPHY N/A 02/13/2019   Procedure: LEFT HEART CATH AND CORONARY ANGIOGRAPHY;  Surgeon: Belva Crome, MD;  Location: [facility];  Service: Cardiovascular;  Laterality: N/A;  . neck benign removed from neck  yrs ago  . PROSTATE BIOPSY N/A 07/12/2012   Procedure: PROSTATE BIOPSY AND ULTRASOUND;  Surgeon:  Ailene Rud, MD;  Location: [facility];  Service: Urology;  Laterality: N/A;  . PROSTATE SURGERY  2002   tuna  . REMOVAL LEFT NECK LYMPH NODE  1996  . TONSILLECTOMY  CHILD  . TOTAL KNEE ARTHROPLASTY Right 03/12/2017   Procedure: RIGHT TOTAL KNEE ARTHROPLASTY;  Surgeon: Gaynelle Arabian, MD;  [facility];  Service: Orthopedics;  Laterality: Right;  Adductor Block  . TRANSURETHRAL RESECTION OF PROSTATE N/A 08/08/2013   Procedure: TRANSURETHRAL RESECTION OF THE PROSTATE WITH GYRUS INSTRUMENTS;  Surgeon: Ailene Rud, MD;  [facility];   Service: Urology;  Laterality: N/A;  . TRANSURETHRAL RESECTION OF PROSTATE      There were no vitals filed for this visit.   Subjective Assessment - 12/19/19 1019    Subjective Patient reports still having difficulty with the [place]. No falls.    Pertinent History ADHD. B12 Deficiency, BPH, Chronic Lymphocystic Leukemia, Crohn's Disease, CAD, Benign Tremor, Hyperlipidemia, Osteoporosis    Limitations Walking    Patient Stated Goals Be able to feel secure    Currently in Pain? Yes    Pain Score 5     Pain Location Back    Pain Orientation Lower    Pain Descriptors / Indicators Aching    Pain Type Chronic pain    Pain Onset More than a month ago                             [facility] Adult PT Treatment/Exercise - 12/19/19 0001      Ambulation/Gait   Ambulation/Gait Yes    Ambulation/Gait Assistance 5: Supervision    Ambulation/Gait Assistance Details throughout therapy session with high level balance    Assistive device None    Gait Pattern Step-through pattern;Narrow base of support    Ambulation Surface Level;Indoor      High Level Balance   High Level Balance Activities Head turns;Negotiating over obstacles;Marching forwards;Side stepping    High Level Balance Comments Completed ambulation with horizontal/vertical head turns x 115 ft each, increased balance challenge with horizontal > vertical. Progressed to completing negoitating over orange hurdles 2 x 40', progressed to addition of dual tasking with negotiating over hurdles including naming animals/food that start with specific letter of [place]. Completed forward marching 3 x 40' with addition of counting backwards by 3's. As well as side stepping 2 x 40' with counting backwards by 4's. Increased challenge noted with counting task often priotizing cognitive task over manual task. CGA throughout.                Balance Exercises - 12/19/19 0001      Balance Exercises: Standing   Standing Eyes  Opened Wide (BOA);Head turns;Foam/compliant surface;Limitations    Standing Eyes Opened Limitations standing with 2-3 inches of space between feet, completed horizontal/vertical head turns x 10 reps each direction.     Standing Eyes Closed Wide (BOA);Foam/compliant surface;3 reps;Time;Limitations    Standing Eyes Closed Time 15-20 secs    Standing Eyes Closed Limitations patient demo increased sway with vision removed, posterior LOB requiring CGA and UE support from // bars to regain balance    Rockerboard Anterior/posterior;Lateral;Head turns;EO;Intermittent UE support;Limitations    Rockerboard Limitations standing on rockerboard positioned anterior, completed static standing x 1 minute with focus on holding steady, followed by completion of vertical head turns x 10 reps. Progressed to completing activites on board positioned laterally, with focus on holding steady and then addition  of horizontal head turns x 10 reps. verbal cues for anterior weight shift.     Tandem Gait Forward;Intermittent upper extremity support;4 reps;Limitations    Tandem Gait Limitations in // bars, completed tandem gait x 4 reps with intermittent UE support    Sidestepping Foam/compliant support;3 reps;Limitations    Sidestepping Limitations on blue balance beams completed side stepping down and back, verbal cues for slowed pace to further promote balance. intermittent UE support required               PT Short Term Goals - 11/26/19 1129      PT SHORT TERM GOAL #1   Title Patient will be independent with initial balance HEP (ALL STG's Due: 12/24/2019)    Baseline no HEP established    Time 3    Period Weeks    Status New    Target Date 12/24/19   due to delay in scheduling     PT [place] #2   Title Patient will completed 5x sit <> stand in </= 10 seconds without UE support to demo improved functional strength    Baseline 12.59 secs    Time 3    Period Weeks    Status New      PT SHORT TERM GOAL  #3   Title Patient will improve FGA to >/= 19/30 to demonstrate reduced fall risk    Baseline 17/30    Time 3    Period Weeks    Status New             PT Long Term Goals - 11/26/19 1131      PT LONG TERM GOAL #1   Title Patient will be independent with final HEP for balance/strengthening (ALL LTGs Due: 01/14/20)    Baseline no HEP established    Time 6    Period Weeks    Status New    Target Date 01/14/20      PT LONG TERM GOAL #2   Title Patient will improve FGA to >/= 22/30 to demonstrate improved balance and reduced risk for falls    Baseline 17/30    Time 6    Period Weeks    Status New      PT LONG TERM GOAL #3   Title Patient will complete TUG in </= 10 seconds to demonstrate reduced risk for falls    Baseline 11.41 secs    Time 6    Period Weeks    Status New      PT LONG TERM GOAL #4   Title Patient will demo ability to ambulate >500 ft on outdoor unlevel surfaces, Mod I to demonstrate improved community mobility    Baseline TBD    Time 6    Period Weeks    Status New                 Plan - 12/19/19 1158    Clinical Impression Statement Continued progression of balance exercises today during session as tolerated by patient. increased challenge still noted with rockerboard and vision removed, often with LOB posteriorly. completed high level balance exercises with addition of dual task with increased challenge noted. Will continue to progress as tolerated by patient.    Personal Factors and Comorbidities Comorbidity 3+    Comorbidities ADHD. B12 Deficiency, BPH, Chronic Lymphocystic Leukemia, Crohn's Disease, CAD, Benign Tremor, Hyperlipidemia, Osteoperosis    Examination-Activity Limitations Stairs;Stand;Locomotion Level    Examination-Participation Restrictions Community Activity;Yard Work;Occupation    Stability/Clinical Environmental health practitioner  Stable/Uncomplicated    Rehab Potential Good    PT Frequency 2x / week    PT Duration 6 weeks    PT  Treatment/Interventions ADLs/Self Care Home Management;Cryotherapy;Moist Heat;DME Instruction;Stair training;Functional mobility training;Therapeutic activities;Gait training;Therapeutic exercise;Balance training;Neuromuscular re-education;Patient/family education;Orthotic Fit/Training;Manual techniques;Passive range of motion;Vestibular    PT Next Visit Plan Continue balance exercises (rockerboard/balance beam) and incorporate postural exercises into balance. Walking with head turns. Dual Tasking    Consulted and Agree with Plan of Care Patient           Patient will benefit from skilled therapeutic intervention in order to improve the following deficits and impairments:  Abnormal gait, Decreased balance, Difficulty walking, Postural dysfunction, Pain, Decreased strength, Decreased activity tolerance, Improper body mechanics  Visit Diagnosis: History of falling  Abnormal posture  Other abnormalities of gait and mobility  Unsteadiness on feet  Muscle weakness (generalized)     Problem List Patient Active Problem List   Diagnosis Date Noted  . Angina pectoris ([place]) 02/13/2019  . Pseudophakia of both eyes 08/01/2018  . History of total knee arthroplasty 03/29/2017  . Stiffness of right knee 03/16/2017  . OA (osteoarthritis) of knee 03/12/2017  . Tremor, essential 12/03/2015  . Essential hypertension 12/09/2014  . Coronary artery disease involving native heart 12/08/2013  . 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[place]) 12/08/2013  . Hyperlipidemia 12/08/2013  . Benign prostatic hypertrophy 08/08/2013  . Essential and other specified forms of tremor 11/25/2012    Jones Bales, PT, DPT 12/19/2019, 12:01 PM  [facility] [address] [place], [place], [address] Phone: [phone]   Fax:  [phone]  Name: Cole Martinez MRN: [id number] Date of Birth: 26-Jan-1946

## 2019-12-24 ENCOUNTER — Other Ambulatory Visit: Payer: Self-pay

## 2019-12-24 ENCOUNTER — Ambulatory Visit: Payer: Medicare Other

## 2019-12-24 DIAGNOSIS — M6281 Muscle weakness (generalized): Secondary | ICD-10-CM

## 2019-12-24 DIAGNOSIS — Z9181 History of falling: Secondary | ICD-10-CM

## 2019-12-24 DIAGNOSIS — R293 Abnormal posture: Secondary | ICD-10-CM

## 2019-12-24 DIAGNOSIS — R2689 Other abnormalities of gait and mobility: Secondary | ICD-10-CM | POA: Diagnosis not present

## 2019-12-24 DIAGNOSIS — R2681 Unsteadiness on feet: Secondary | ICD-10-CM | POA: Diagnosis not present

## 2019-12-24 NOTE — Therapy (Signed)
[facility] [address] [place], [place], [address] Phone: [phone]   Fax:  [phone]  Physical Therapy Treatment  Patient Details  Name: Cole Martinez MRN: [id number] Date of Birth: 23-Sep-1945 Referring Provider (PT): Referred by: Delrae Rend, MD. PCP is Josetta Huddle, MD   Encounter Date: 12/24/2019   PT End of Session - 12/24/19 0717    Visit Number 6    Number of Visits 13    Date for PT Re-Evaluation 01/25/20   POC for 6 weeks, Cert for 60 days   Authorization Type Medicare (10th visit PN)    Progress Note Due on Visit 10    PT Start Time 0718    PT Stop Time 0759    PT Time Calculation (min) 41 min    Equipment Utilized During Treatment Gait belt    Activity Tolerance Patient tolerated treatment well    Behavior During Therapy [facility] for tasks assessed/performed           Past Medical History:  Diagnosis Date  . ADHD (attention deficit hyperactivity disorder)   . B12 deficiency   . BPH (benign prostatic hypertrophy)   . 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 Coronary artery disease CARDIOLOGIST- DR Daneen Schick   S/P STENTING LAD 1999 // Myoview 01/2019: EF 62, normal perfusion; Low Risk  . Crohn's disease of ileum ([place]) SINCE 1988  . ED (erectile dysfunction)   . Elevated PSA   . Essential and other specified forms of tremor 11/25/2012  . H/O adenomatous polyp of colon   . Hyperlipidemia   . Nocturia   . OA (osteoarthritis)   . Peripheral neuropathy    hx of, none current as of 08-04-13  . Peyronie disease   . S/P coronary artery stent placement OCT 1999 OF LAD  . Tremor, hereditary, benign MILD RIGHT HAND    Past Surgical History:  Procedure Laterality Date  . CARDIOVASCULAR STRESS TEST  11-01-2010 DR Daneen Schick   NORMAL PERFUSION STUDY/ EF 64%/ NO ISCHEMIA  . cataract surgery   Bilateral feburary 2020   with lens placement   . COLONOSCOPY WITH PROPOFOL N/A 09/24/2012   Procedure: COLONOSCOPY WITH PROPOFOL;  Surgeon: [name], MD;  Location: [facility];  Service: Endoscopy;  Laterality: N/A;  . CORONARY ANGIOPLASTY WITH STENT PLACEMENT  OCT 1999   STENT OF LAD  . CORONARY STENT INTERVENTION N/A 02/14/2019   Procedure: CORONARY STENT INTERVENTION;  Surgeon: Belva Crome, MD;  Location: [facility];  Service: Cardiovascular;  Laterality: N/A;  . CYSTOSCOPY WITH URETHRAL DILATATION  03/12/2017   Procedure: CYSTOSCOPY WITH URETHRAL DILATATION;  Surgeon: Gaynelle Arabian, MD;  Location: [facility];  Service: Orthopedics;;  Ammie Dalton, Resident Assisting  . CYSTOSCOPY WITH URETHRAL DILATATION N/A 10/03/2018   Procedure: CYSTOSCOPY WITH URETHRAL BALLOON DILATATION WITH BILATERAL RETROGRADE PYELOGRAPHY;  Surgeon: Raynelle Bring, MD;  Location: [facility];  Service: Urology;  Laterality: N/A;  . LAPAROSCOPIC INGUINAL HERNIA REPAIR Bilateral 01-10-2004   W/ MESH  . LEFT HEART CATH AND CORONARY ANGIOGRAPHY N/A 02/13/2019   Procedure: LEFT HEART CATH AND CORONARY ANGIOGRAPHY;  Surgeon: Belva Crome, MD;  Location: [facility];  Service: Cardiovascular;  Laterality: N/A;  . neck benign removed from neck  yrs ago  . PROSTATE BIOPSY N/A 07/12/2012   Procedure: PROSTATE BIOPSY AND ULTRASOUND;  Surgeon:  Ailene Rud, MD;  Location: [facility];  Service: Urology;  Laterality: N/A;  . PROSTATE SURGERY  2002   tuna  . REMOVAL LEFT NECK LYMPH NODE  1996  . TONSILLECTOMY  CHILD  . TOTAL KNEE ARTHROPLASTY Right 03/12/2017   Procedure: RIGHT TOTAL KNEE ARTHROPLASTY;  Surgeon: Gaynelle Arabian, MD;  Location: [facility];  Service: Orthopedics;  Laterality: Right;  Adductor Block  . TRANSURETHRAL RESECTION OF PROSTATE N/A 08/08/2013   Procedure: TRANSURETHRAL RESECTION OF THE PROSTATE WITH GYRUS INSTRUMENTS;  Surgeon: Ailene Rud, MD;  Location: [facility];   Service: Urology;  Laterality: N/A;  . TRANSURETHRAL RESECTION OF PROSTATE      There were no vitals filed for this visit.   Subjective Assessment - 12/24/19 0720    Subjective Patient reports no changes since last visit. No falls.    Pertinent History ADHD. B12 Deficiency, BPH, Chronic Lymphocystic Leukemia, Crohn's Disease, CAD, Benign Tremor, Hyperlipidemia, Osteoporosis    Limitations Walking    Patient Stated Goals Be able to feel secure    Currently in Pain? Yes    Pain Score 3     Pain Location Back    Pain Orientation Lower    Pain Descriptors / Indicators Aching    Pain Type Chronic pain    Pain Onset More than a month ago              [facility] PT Assessment - 12/24/19 0725      Functional Gait  Assessment   Gait assessed  Yes    Gait Level Surface Walks 20 ft in less than 7 sec but greater than 5.5 sec, uses assistive device, slower speed, mild gait deviations, or deviates 6-10 in outside of the 12 in walkway width.    Change in Gait Speed Able to smoothly change walking speed without loss of balance or gait deviation. Deviate no more than 6 in outside of the 12 in walkway width.    Gait with Horizontal Head Turns Performs head turns smoothly with slight change in gait velocity (eg, minor disruption to smooth gait path), deviates 6-10 in outside 12 in walkway width, or uses an assistive device.    Gait with Vertical Head Turns Performs task with slight change in gait velocity (eg, minor disruption to smooth gait path), deviates 6 - 10 in outside 12 in walkway width or uses assistive device    Gait and Pivot Turn Pivot turns safely within 3 sec and stops quickly with no loss of balance.    Step Over Obstacle Is able to step over one shoe box (4.5 in total height) without changing gait speed. No evidence of imbalance.    Gait with Narrow Base of Support Ambulates 7-9 steps.    Gait with Eyes Closed Walks 20 ft, slow speed, abnormal gait pattern, evidence for imbalance,  deviates 10-15 in outside 12 in walkway width. Requires more than 9 sec to ambulate 20 ft.    Ambulating Backwards Walks 20 ft, slow speed, abnormal gait pattern, evidence for imbalance, deviates 10-15 in outside 12 in walkway width.    Steps Alternating feet, must use rail.    Total Score 20    FGA comment: 20/30 = Medium Fall Risk                [facility] Adult PT Treatment/Exercise - 12/24/19 0001      Transfers   Transfers Sit to Stand;Stand to Sit    Sit to Stand 5: Supervision  Five time sit to stand comments  9.95 secs w/o UE support from mat at standard chair height     Stand to Sit 5: Supervision      Ambulation/Gait   Ambulation/Gait Yes    Ambulation/Gait Assistance 5: Supervision    Ambulation/Gait Assistance Details througout therapy gym with activities    Assistive device None    Gait Pattern Step-through pattern;Narrow base of support    Ambulation Surface Level;Indoor      High Level Balance   High Level Balance Activities Head turns    High Level Balance Comments Completed ambulation with horizontal/vertical head turns x 115 ft each. Increased challenge noted with horixontal > vertical.  CGA required, with 1-2 instances of imbalance.                Balance Exercises - 12/24/19 0001      Balance Exercises: Standing   Rockerboard Anterior/posterior;Head turns;EO;Intermittent UE support;Limitations    Rockerboard Limitations standing on rockerboard positioned ant/posterior completed static standing x 2 minutes with board holding steady, progressed to completing ant/posterior weight shifts x 15 reps each direction, intermittent UE support required. Patient demo keeping COG posteriorly. requiring CGA from PT and verbal/tactile cues. With holding board steady, completing horizontal/vertical head turns x 10 reps. Increased balance challenge with head turns.     Sidestepping Foam/compliant support;3 reps;Limitations    Sidestepping Limitations completed side  stepping on blue mat x 4 laps down and back, no UE support. verbal cues for improved step length with completion.     Marching Foam/compliant surface;Intermittent upper extremity assist;Forwards;Limitations    Marching Limitations completed marching forwards on blue mat x 4 laps, down and back. vebral cues for posture and core engagement with completion.     Other Standing Exercises standing on blue mat completed alternating overhead B shoulder flexion with 1# weight ball for improved posture and balance challenge. progressed to narrow BOS x 10 reps. Patient able to complete with CGA and demo improved posture througohut.                PT Short Term Goals - 12/24/19 5465      PT SHORT TERM GOAL #1   Title Patient will be independent with initial balance HEP (ALL STG's Due: 12/24/2019)    Baseline Reports independence, completing daily.    Time 3    Period Weeks    Status Achieved    Target Date 12/24/19   due to delay in scheduling     PT [place] #2   Title Patient will completed 5x sit <> stand in </= 10 seconds without UE support to demo improved functional strength    Baseline 12.59 secs, 9.59 secs w/o UE support    Time 3    Period Weeks    Status Achieved      PT SHORT TERM GOAL #3   Title Patient will improve FGA to >/= 19/30 to demonstrate reduced fall risk    Baseline 17/30, 20/30    Time 3    Period Weeks    Status Achieved             PT Long Term Goals - 11/26/19 1131      PT LONG TERM GOAL #1   Title Patient will be independent with final HEP for balance/strengthening (ALL LTGs Due: 01/14/20)    Baseline no HEP established    Time 6    Period Weeks    Status New    Target Date  01/14/20      PT LONG TERM GOAL #2   Title Patient will improve FGA to >/= 22/30 to demonstrate improved balance and reduced risk for falls    Baseline 17/30    Time 6    Period Weeks    Status New      PT LONG TERM GOAL #3   Title Patient will complete TUG in </=  10 seconds to demonstrate reduced risk for falls    Baseline 11.41 secs    Time 6    Period Weeks    Status New      PT LONG TERM GOAL #4   Title Patient will demo ability to ambulate >500 ft on outdoor unlevel surfaces, Mod I to demonstrate improved community mobility    Baseline TBD    Time 6    Period Weeks    Status New                 Plan - 12/24/19 0801    Clinical Impression Statement Today's skilled PT session included assessment of patient's progress toward all STG's. Patient able to meet all STGs today during session. Patient scored 20/30 on FGA demonstrating progress with PT services with improved balance and reduced fall risk. Continued balance exercises and progressed as able. Will continue to progress toward all LTG's.    Personal Factors and Comorbidities Comorbidity 3+    Comorbidities ADHD. B12 Deficiency, BPH, Chronic Lymphocystic Leukemia, Crohn's Disease, CAD, Benign Tremor, Hyperlipidemia, Osteoperosis    Examination-Activity Limitations Stairs;Stand;Locomotion Level    Examination-Participation Restrictions Community Activity;Yard Work;Occupation    Stability/Clinical Decision Making Stable/Uncomplicated    Rehab Potential Good    PT Frequency 2x / week    PT Duration 6 weeks    PT Treatment/Interventions ADLs/Self Care Home Management;Cryotherapy;Moist Heat;DME Instruction;Stair training;Functional mobility training;Therapeutic activities;Gait training;Therapeutic exercise;Balance training;Neuromuscular re-education;Patient/family education;Orthotic Fit/Training;Manual techniques;Passive range of motion;Vestibular    PT Next Visit Plan Continue balance exercises (rockerboard/balance beam) and incorporate postural exercises into balance. Walking with head turns. Dual Tasking    Consulted and Agree with Plan of Care Patient           Patient will benefit from skilled therapeutic intervention in order to improve the following deficits and impairments:   Abnormal gait, Decreased balance, Difficulty walking, Postural dysfunction, Pain, Decreased strength, Decreased activity tolerance, Improper body mechanics  Visit Diagnosis: History of falling  Abnormal posture  Other abnormalities of gait and mobility  Unsteadiness on feet  Muscle weakness (generalized)     Problem List Patient Active Problem List   Diagnosis Date Noted  . Angina pectoris ([place]) 02/13/2019  . Pseudophakia of both eyes 08/01/2018  . History of total knee arthroplasty 03/29/2017  . Stiffness of right knee 03/16/2017  . OA (osteoarthritis) of knee 03/12/2017  . Tremor, essential 12/03/2015  . Essential hypertension 12/09/2014  . Coronary artery disease involving native heart 12/08/2013  . 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[place]) 12/08/2013  . Hyperlipidemia 12/08/2013  . Benign prostatic hypertrophy 08/08/2013  . Essential and other specified forms of tremor 11/25/2012    Jones Bales, PT, DPT 12/24/2019, 8:05 AM  [facility] [address] [place], [place], [address] Phone: [phone]   Fax:  [phone]  Name: Cole Martinez MRN: [id number] Date of Birth: March 18, 1945

## 2019-12-26 ENCOUNTER — Ambulatory Visit: Payer: Medicare Other

## 2019-12-26 ENCOUNTER — Other Ambulatory Visit: Payer: Self-pay

## 2019-12-26 DIAGNOSIS — R293 Abnormal posture: Secondary | ICD-10-CM

## 2019-12-26 DIAGNOSIS — Z9181 History of falling: Secondary | ICD-10-CM | POA: Diagnosis not present

## 2019-12-26 DIAGNOSIS — R2681 Unsteadiness on feet: Secondary | ICD-10-CM | POA: Diagnosis not present

## 2019-12-26 DIAGNOSIS — M6281 Muscle weakness (generalized): Secondary | ICD-10-CM

## 2019-12-26 DIAGNOSIS — R2689 Other abnormalities of gait and mobility: Secondary | ICD-10-CM | POA: Diagnosis not present

## 2019-12-26 NOTE — Patient Instructions (Signed)
Access Code: [email or web address] URL: [email or web address] Date: 12/26/2019 Prepared by: Baldomero Lamy  Exercises Romberg Stance with Eyes Closed - 1 x daily - 5 x weekly - 1 sets - 3 reps - 30 hold Feet Apart with Head Nods on Foam Pad - 1 x daily - 5 x weekly - 2 sets - 10 reps Tandem Stance - 1 x daily - 5 x weekly - 1 sets - 6 reps - 30 hold Single Leg Stance with Support - 1 x daily - 5 x weekly - 1 sets - 3 reps - 5-15 secs hold Tandem Walking with Counter Support - 1 x daily - 5 x weekly - 3 sets Walking March with Countertop Support - 1 x daily - 5 x weekly - 3 sets Scapular Retraction with Resistance - 1 x daily - 5 x weekly - 2 sets - 10 reps - 3 hold Doorway Pec Stretch at 60 Elevation - 1 x daily - 5 x weekly - 1 sets - 3 reps - 30 hold Supine Chin Tuck - 1 x daily - 5 x weekly - 2 sets - 10 reps - 3 hold

## 2019-12-26 NOTE — Therapy (Signed)
[facility] [address] [place], [place], [address] Phone: [phone]   Fax:  [phone]  Physical Therapy Treatment  Patient Details  Name: Cole Martinez MRN: [id number] Date of Birth: [birth year] Referring Provider (PT): Referred by: Delrae Rend, MD. PCP is Josetta Huddle, MD   Encounter Date: 12/26/2019   PT End of Session - 12/26/19 1020    Visit Number 7    Number of Visits 13    Date for PT Re-Evaluation 01/25/20   POC for 6 weeks, Cert for 60 days   Authorization Type Medicare (10th visit PN)    Progress Note Due on Visit 10    PT Start Time 1018    PT Stop Time 1100    PT Time Calculation (min) 42 min    Equipment Utilized During Treatment Gait belt    Activity Tolerance Patient tolerated treatment well    Behavior During Therapy WFL for tasks assessed/performed           Past Medical History:  Diagnosis Date   ADHD (attention deficit hyperactivity disorder)    B12 deficiency    BPH (benign prostatic hypertrophy)    Chronic lymphocytic leukemia (CLL), T-cell ([place]) [place]--  ONCOLOGIST-  DR Benay Spice   PT IS ASYMPTOMATIC--- LAST CBC W/ DIFF 06-24-2012 STABLE   Coronary artery disease CARDIOLOGIST- DR Daneen Schick   S/P STENTING LAD 1999 // Myoview 01/2019: EF 62, normal perfusion; Low Risk   Crohn's disease of ileum ([place]) [place]   ED (erectile dysfunction)    Elevated PSA    Essential and other specified forms of tremor 11/25/2012   H/O adenomatous polyp of colon    Hyperlipidemia    Nocturia    OA (osteoarthritis)    Peripheral neuropathy    hx of, none current as of 08-04-13   Peyronie disease    S/P coronary artery stent placement OCT 1999 OF LAD   Tremor, hereditary, benign MILD RIGHT HAND    Past Surgical History:  Procedure Laterality Date   CARDIOVASCULAR STRESS TEST  11-01-2010 DR Daneen Schick   NORMAL PERFUSION STUDY/ EF 64%/ NO ISCHEMIA   cataract surgery   Bilateral feburary 2020   with lens placement    COLONOSCOPY WITH PROPOFOL N/A 09/24/2012   Procedure: COLONOSCOPY WITH PROPOFOL;  Surgeon: [name], MD;  [facility];  Service: Endoscopy;  Laterality: N/A;   CORONARY ANGIOPLASTY WITH STENT PLACEMENT  OCT 1999   STENT OF LAD   CORONARY STENT INTERVENTION N/A 02/14/2019   Procedure: CORONARY STENT INTERVENTION;  Surgeon: Belva Crome, MD;  Location: [facility];  Service: Cardiovascular;  Laterality: N/A;   CYSTOSCOPY WITH URETHRAL DILATATION  03/12/2017   Procedure: CYSTOSCOPY WITH URETHRAL DILATATION;  Surgeon: Gaynelle Arabian, MD;  [facility];  Service: Orthopedics;;  Ammie Dalton, Resident Assisting   CYSTOSCOPY WITH URETHRAL DILATATION N/A 10/03/2018   Procedure: CYSTOSCOPY WITH URETHRAL BALLOON DILATATION WITH BILATERAL RETROGRADE PYELOGRAPHY;  Surgeon: Raynelle Bring, MD;  [facility];  Service: Urology;  Laterality: N/A;   LAPAROSCOPIC INGUINAL HERNIA REPAIR Bilateral 01-10-2004   W/ MESH   LEFT HEART CATH AND CORONARY ANGIOGRAPHY N/A 02/13/2019   Procedure: LEFT HEART CATH AND CORONARY ANGIOGRAPHY;  Surgeon: Belva Crome, MD;  Location: [facility];  Service: Cardiovascular;  Laterality: N/A;   neck benign removed from neck  yrs ago   PROSTATE BIOPSY N/A 07/12/2012   Procedure: PROSTATE BIOPSY AND ULTRASOUND;  Surgeon:  Ailene Rud, MD;  Location[facility];  Service: Urology;  Laterality: N/A;   PROSTATE SURGERY  2002   tuna   REMOVAL LEFT NECK LYMPH NODE  1996   TONSILLECTOMY  CHILD   TOTAL KNEE ARTHROPLASTY Right 03/12/2017   Procedure: RIGHT TOTAL KNEE ARTHROPLASTY;  Surgeon: Gaynelle Arabian, MD;  [facility];  Service: Orthopedics;  Laterality: Right;  Adductor Block   TRANSURETHRAL RESECTION OF PROSTATE N/A 08/08/2013   Procedure: TRANSURETHRAL RESECTION OF THE PROSTATE WITH GYRUS INSTRUMENTS;  Surgeon: Ailene Rud, MD;  [facility];   Service: Urology;  Laterality: N/A;   TRANSURETHRAL RESECTION OF PROSTATE      There were no vitals filed for this visit.   Subjective Assessment - 12/26/19 1022    Subjective Patient reports no new changes/complaint since last visit. No falls. Completed HEP this morning.    Pertinent History ADHD. B12 Deficiency, BPH, Chronic Lymphocystic Leukemia, Crohn's Disease, CAD, Benign Tremor, Hyperlipidemia, Osteoporosis    Limitations Walking    Patient Stated Goals Be able to feel secure    Currently in Pain? No/denies    Pain Onset More than a month ago                  [facility] Adult PT Treatment/Exercise - 12/26/19 0001      Ambulation/Gait   Ambulation/Gait Yes    Ambulation/Gait Assistance 5: Supervision    Ambulation/Gait Assistance Details throughout therapy session without AD    Assistive device None    Gait Pattern Step-through pattern;Narrow base of support    Ambulation Surface Level;Indoor      Neuro Re-ed    Neuro Re-ed Details  In // bars: completed forward/backward steps over black balance beam x 10 reps bilaterally, verbal cues for posture with completion and improved step length. increased challenge noted with backward step with LLE. Completed lateral side stepping x 10 reps each direction, with addition of UE movement to promote improved posture.       Exercises   Exercises Other Exercises    Other Exercises  Completed resisted scapular retraction with red theraband, 2 x 10 reps. verbal cues for improved form/technique as well as tactile cues to promote improved scapular retraction. PT educated on completion at home and added as addition to HEP.                Balance Exercises - 12/26/19 0001      Balance Exercises: Standing   Standing Eyes Opened Wide (BOA);Head turns;Foam/compliant surface;Limitations    Standing Eyes Opened Limitations completed horizontal/vertical head turns x 10 reps each with feet slightly narrow BOS (2-3 inches between feet).      Standing Eyes Closed Wide (BOA);Foam/compliant surface;3 reps;Time    Standing Eyes Closed Time 15-20 secs    Sidestepping Foam/compliant support;3 reps;Limitations    Sidestepping Limitations completed side stepping on blue mat x 3 laps down and back, no UE support. verbal cues for improved step length with completion.     Other Standing Exercises At countertop with single UE support: completed negotiating over cones with alternating toe tap to cone to promote SLS, completed down and back at countertop x 4 laps. intermittent CGA.              PT Education - 12/26/19 1042    Education Details HEP Update (Scapular Retraction with Resistance)    Person(s) Educated Patient    Methods Explanation;Demonstration;Handout    Comprehension Verbalized understanding;Returned demonstration;Verbal cues required  PT Short Term Goals - 12/24/19 7654      PT SHORT TERM GOAL #1   Title Patient will be independent with initial balance HEP (ALL STG's Due: 12/24/2019)    Baseline Reports independence, completing daily.    Time 3    Period Weeks    Status Achieved    Target Date 12/24/19   due to delay in scheduling     PT [place] #2   Title Patient will completed 5x sit <> stand in </= 10 seconds without UE support to demo improved functional strength    Baseline 12.59 secs, 9.59 secs w/o UE support    Time 3    Period Weeks    Status Achieved      PT SHORT TERM GOAL #3   Title Patient will improve FGA to >/= 19/30 to demonstrate reduced fall risk    Baseline 17/30, 20/30    Time 3    Period Weeks    Status Achieved             PT Long Term Goals - 11/26/19 1131      PT LONG TERM GOAL #1   Title Patient will be independent with final HEP for balance/strengthening (ALL LTGs Due: 01/14/20)    Baseline no HEP established    Time 6    Period Weeks    Status New    Target Date 01/14/20      PT LONG TERM GOAL #2   Title Patient will improve FGA to >/= 22/30 to  demonstrate improved balance and reduced risk for falls    Baseline 17/30    Time 6    Period Weeks    Status New      PT LONG TERM GOAL #3   Title Patient will complete TUG in </= 10 seconds to demonstrate reduced risk for falls    Baseline 11.41 secs    Time 6    Period Weeks    Status New      PT LONG TERM GOAL #4   Title Patient will demo ability to ambulate >500 ft on outdoor unlevel surfaces, Mod I to demonstrate improved community mobility    Baseline TBD    Time 6    Period Weeks    Status New                 Plan - 12/26/19 1113    Clinical Impression Statement Today's skilled PT session included exercises to continue to promote posture, with progression of scapular retraction exercise with addition of resistance. Continued balance exercises focus with incorporating of stepping strategys and incorporation of posture activites into balance. Patient tolerating well, will cotinue to progress toward all goals.    Personal Factors and Comorbidities Comorbidity 3+    Comorbidities ADHD. B12 Deficiency, BPH, Chronic Lymphocystic Leukemia, Crohn's Disease, CAD, Benign Tremor, Hyperlipidemia, Osteoperosis    Examination-Activity Limitations Stairs;Stand;Locomotion Level    Examination-Participation Restrictions Community Activity;Yard Work;Occupation    Stability/Clinical Decision Making Stable/Uncomplicated    Rehab Potential Good    PT Frequency 2x / week    PT Duration 6 weeks    PT Treatment/Interventions ADLs/Self Care Home Management;Cryotherapy;Moist Heat;DME Instruction;Stair training;Functional mobility training;Therapeutic activities;Gait training;Therapeutic exercise;Balance training;Neuromuscular re-education;Patient/family education;Orthotic Fit/Training;Manual techniques;Passive range of motion;Vestibular    PT Next Visit Plan How was HEP addition? Continue balance exercises (rockerboard/balance beam) and incorporate postural exercises into balance. Walking with  head turns. Dual Tasking    Consulted and Agree with Plan of  Care Patient           Patient will benefit from skilled therapeutic intervention in order to improve the following deficits and impairments:  Abnormal gait, Decreased balance, Difficulty walking, Postural dysfunction, Pain, Decreased strength, Decreased activity tolerance, Improper body mechanics  Visit Diagnosis: History of falling  Abnormal posture  Other abnormalities of gait and mobility  Unsteadiness on feet  Muscle weakness (generalized)     Problem List Patient Active Problem List   Diagnosis Date Noted   Angina pectoris ([place]) 02/13/2019   Pseudophakia of both eyes 08/01/2018   History of total knee arthroplasty 03/29/2017   Stiffness of right knee 03/16/2017   OA (osteoarthritis) of knee 03/12/2017   Tremor, essential 12/03/2015   Essential hypertension 12/09/2014   Coronary artery disease involving native heart 12/08/2013   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[place]) 12/08/2013   Hyperlipidemia 12/08/2013   Benign prostatic hypertrophy 08/08/2013   Essential and other specified forms of tremor 11/25/2012    Jones Bales, PT, DPT 12/26/2019, 11:15 AM  [facility] [address] [place], [place], [address] Phone: [phone]   Fax:  [phone]  Name: Cole Martinez MRN: [id number] Date of Birth: 09/26/1945

## 2019-12-29 ENCOUNTER — Other Ambulatory Visit: Payer: Self-pay

## 2019-12-29 ENCOUNTER — Telehealth: Payer: Self-pay | Admitting: Neurology

## 2019-12-29 ENCOUNTER — Ambulatory Visit: Payer: Medicare Other

## 2019-12-29 DIAGNOSIS — Z9181 History of falling: Secondary | ICD-10-CM

## 2019-12-29 DIAGNOSIS — M6281 Muscle weakness (generalized): Secondary | ICD-10-CM | POA: Diagnosis not present

## 2019-12-29 DIAGNOSIS — R2681 Unsteadiness on feet: Secondary | ICD-10-CM | POA: Diagnosis not present

## 2019-12-29 DIAGNOSIS — R2689 Other abnormalities of gait and mobility: Secondary | ICD-10-CM | POA: Diagnosis not present

## 2019-12-29 DIAGNOSIS — R293 Abnormal posture: Secondary | ICD-10-CM | POA: Diagnosis not present

## 2019-12-29 NOTE — Telephone Encounter (Signed)
Called patient for appointment on Tuesay 10/26 with Judson Roch, NP at [phone].  Patient accepted and agreed to check in by 0830.  Patient expressed appreciation

## 2019-12-29 NOTE — Progress Notes (Signed)
PATIENT: Cole Martinez DOB: 1945-10-21  REASON FOR VISIT: follow up HISTORY FROM: patient  HISTORY OF PRESENT ILLNESS: Today 12/30/19 Cole Martinez is a 74 year old male with history of essential tremor.  He has action and resting components of both upper extremities.  Over time, the tremor has slightly worsened.  He is on low-dose Adderall (5 mg once daily).  At times, he may have a head neck component to the tremor.  The tremor worsens when he is anxious or upset.  Had a fall last year, has noticed overtime, his balance is not as good, is currently in PT.  Is healthy.  At times, he may have difficulty with handwriting, he continues to eat fairly well, rarely drops food.  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  The tremor is more prominent left hand.  He exercises at the gym 5 days a week, does a lot of walking, has a dog.  He still remains quite active.  Is not keen to add on another medication.  Is concerned he may have Parkinson's disease, his brother had it, also 2 cousins.  He presents today for evaluation unaccompanied.  HISTORY 04/16/2019 SS: Cole Martinez is a 74 year old male with history of essential tremor.  He has an action and resting component of both upper extremities.  He feels that over the years, his tremor slightly worsens.  The tremor worsens when he exercises or is upset.  He is on Adderall low-dose.  He does not wish to be on medications for tremor.  He had a cardiac stent placed in December 2020.  At times he may have a head and neck component to the tremor.  At times, he may have difficulty with handwriting.  He says he does well with eating, occasionally he may drop food off his fork.  He had 1 fall last summer while carrying something upstairs, fell backwards.  He does feel his balance is sometimes off.  He walks every day.  He feels he is forgetful with his memory.  He may misplace things.  He will be starting cardiac rehab next  week.  He has had a tremor since at least 2007, he reports his brother had Parkinson's disease, and that he also has family history of essential tremor.  He presents today for evaluation unaccompanied.  REVIEW OF SYSTEMS: Out of a complete 14 system review of symptoms, the patient complains only of the following symptoms, and all other reviewed systems are negative.  Tremor  ALLERGIES: No Known Allergies  HOME MEDICATIONS: Outpatient Medications Prior to Visit  Medication Sig Dispense Refill  . acetaminophen (TYLENOL) 500 MG tablet Take 1,000 mg by mouth at bedtime as needed for mild pain (joint pain).     Marland Kitchen amLODipine (NORVASC) 2.5 MG tablet TAKE 1 TABLET BY MOUTH  DAILY 90 tablet 3  . amphetamine-dextroamphetamine (ADDERALL) 5 MG tablet Take 5 mg by mouth daily.     Marland Kitchen atorvastatin (LIPITOR) 20 MG tablet TAKE 1 TABLET BY MOUTH IN  THE MORNING 90 tablet 3  . calcium citrate (CALCITRATE - DOSED IN MG ELEMENTAL CALCIUM) 950 (200 Ca) MG tablet Take 200 mg of elemental calcium by mouth daily.    . Cholecalciferol (VITAMIN D3) 50 MCG (2000 UT) capsule Take 2,000 Units by mouth daily.     . clopidogrel (PLAVIX) 75 MG tablet Take 1 tablet (75 mg total) by mouth daily. 90 tablet 3  . Coenzyme Q10 (CO Q-10)  200 MG CAPS Take 200 mg by mouth daily.     . Cyanocobalamin (VITAMIN B-12) 2500 MCG SUBL Place 2,500 mcg under the tongue daily.    . metoprolol succinate (TOPROL-XL) 25 MG 24 hr tablet Take 1 tablet (25 mg total) by mouth daily. 90 tablet 3  . pantoprazole (PROTONIX) 40 MG tablet Take 1 tablet (40 mg total) by mouth daily. 90 tablet 3   No facility-administered medications prior to visit.    PAST MEDICAL HISTORY: Past Medical History:  Diagnosis Date  . ADHD (attention deficit hyperactivity disorder)   . B12 deficiency   . BPH (benign prostatic hypertrophy)   . Chronic lymphocytic leukemia (CLL), T-cell ([place]) DX 1996--  ONCOLOGIST-  DR [facility]   PT IS ASYMPTOMATIC--- LAST CBC W/ DIFF  06-24-2012 STABLE  . Coronary artery disease CARDIOLOGIST- DR Daneen Schick   S/P STENTING LAD 1999 // Myoview 01/2019: EF 62, normal perfusion; Low Risk  . Crohn's disease of ileum ([place]) SINCE 1988  . ED (erectile dysfunction)   . Elevated PSA   . Essential and other specified forms of tremor 11/25/2012  . H/O adenomatous polyp of colon   . Hyperlipidemia   . Nocturia   . OA (osteoarthritis)   . Peripheral neuropathy    hx of, none current as of 08-04-13  . Peyronie disease   . S/P coronary artery stent placement OCT 1999 OF LAD  . Tremor, hereditary, benign MILD RIGHT HAND    PAST SURGICAL HISTORY: Past Surgical History:  Procedure Laterality Date  . CARDIOVASCULAR STRESS TEST  11-01-2010 DR Daneen Schick   NORMAL PERFUSION STUDY/ EF 64%/ NO ISCHEMIA  . cataract surgery  Bilateral feburary 2020   with lens placement   . COLONOSCOPY WITH PROPOFOL N/A 09/24/2012   Procedure: COLONOSCOPY WITH PROPOFOL;  Surgeon: [name], MD;  [facility];  Service: Endoscopy;  Laterality: N/A;  . CORONARY ANGIOPLASTY WITH STENT PLACEMENT  OCT 1999   STENT OF LAD  . CORONARY STENT INTERVENTION N/A 02/14/2019   Procedure: CORONARY STENT INTERVENTION;  Surgeon: Belva Crome, MD;  Location: [facility];  Service: Cardiovascular;  Laterality: N/A;  . CYSTOSCOPY WITH URETHRAL DILATATION  03/12/2017   Procedure: CYSTOSCOPY WITH URETHRAL DILATATION;  Surgeon: Gaynelle Arabian, MD;  [facility];  Service: Orthopedics;;  Ammie Dalton, Resident Assisting  . CYSTOSCOPY WITH URETHRAL DILATATION N/A 10/03/2018   Procedure: CYSTOSCOPY WITH URETHRAL BALLOON DILATATION WITH BILATERAL RETROGRADE PYELOGRAPHY;  Surgeon: Raynelle Bring, MD;  [facility];  Service: Urology;  Laterality: N/A;  . LAPAROSCOPIC INGUINAL HERNIA REPAIR Bilateral 01-10-2004   W/ MESH  . LEFT HEART CATH AND CORONARY ANGIOGRAPHY N/A 02/13/2019   Procedure: LEFT HEART CATH AND CORONARY ANGIOGRAPHY;  Surgeon: Belva Crome, MD;  Location: [facility];  Service: Cardiovascular;  Laterality: N/A;  . neck benign removed from neck  yrs ago  . PROSTATE BIOPSY N/A 07/12/2012   Procedure: PROSTATE BIOPSY AND ULTRASOUND;  Surgeon: Ailene Rud, MD;  Location: [facility];  Service: Urology;  Laterality: N/A;  . PROSTATE SURGERY  2002   tuna  . REMOVAL LEFT NECK LYMPH NODE  1996  . TONSILLECTOMY  CHILD  . TOTAL KNEE ARTHROPLASTY Right 03/12/2017   Procedure: RIGHT TOTAL KNEE ARTHROPLASTY;  Surgeon: Gaynelle Arabian, MD;  [facility];  Service: Orthopedics;  Laterality: Right;  Adductor Block  . TRANSURETHRAL RESECTION OF PROSTATE N/A 08/08/2013   Procedure: TRANSURETHRAL RESECTION OF THE PROSTATE  WITH GYRUS INSTRUMENTS;  Surgeon: Ailene Rud, MD;  [facility];  Service: Urology;  Laterality: N/A;  . TRANSURETHRAL RESECTION OF PROSTATE      FAMILY HISTORY: Family History  Problem Relation Age of Onset  . Obesity Brother   . Diabetes Brother   . Parkinsonism Brother     SOCIAL HISTORY: Social History   Socioeconomic History  . Marital status: Married    Spouse name: Not on file  . Number of children: 3  . Years of education: 16  . Highest education level: Bachelor's degree (e.g., BA, AB, BS)  Occupational History  . Not on file  Tobacco Use  . Smoking status: Former Smoker    Packs/day: 1.00    Years: 10.00    Pack years: 10.00    Types: Cigarettes    Quit date: 07/09/1984    Years since quitting: 35.4  . Smokeless tobacco: Never Used  Vaping Use  . Vaping Use: Never used  Substance and Sexual Activity  . Alcohol use: Yes    Alcohol/week: 2.0 standard drinks    Types: 2 Standard drinks or equivalent per week    Comment: daily 1 per day ,   . Drug use: No  . Sexual activity: Not on file  Other Topics Concern  . Not on file  Social History Narrative   Lives at home with is wife, [name].  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  Has BBA.  Has 3 children.    Caffeine 2  cups coffee.     Social Determinants of Health   Financial Resource Strain:   . Difficulty of Paying Living Expenses: Not on file  Food Insecurity: No Food Insecurity  . Worried About Charity fundraiser in the Last Year: Never true  . Ran Out of Food in the Last Year: Never true  Transportation Needs: No Transportation Needs  . Lack of Transportation (Medical): No  . Lack of Transportation (Non-Medical): No  Physical Activity: Insufficiently Active  . Days of Exercise per Week: 3 days  . Minutes of Exercise per Session: 40 min  Stress:   . Feeling of Stress : Not on file  Social Connections: Unknown  . Frequency of Communication with Friends and Family: More than three times a week  . Frequency of Social Gatherings with Friends and Family: Once a week  . Attends Religious Services: Not on file  . Active Member of Clubs or Organizations: Not on file  . Attends Archivist Meetings: Not on file  . Marital Status: Married  Human resources officer Violence:   . Fear of Current or Ex-Partner: Not on file  . Emotionally Abused: Not on file  . Physically Abused: Not on file  . Sexually Abused: Not on file   PHYSICAL EXAM  Vitals:   12/30/19 0851  BP: 127/79  Pulse: 61  Weight: 180 lb 12.8 oz (82 kg)  Height: 5' 8"  (1.727 m)   Body mass index is 27.49 kg/m.  Generalized: Well developed, in no acute distress  Neurological examination  Mentation: Alert oriented to time, place, history taking. Follows all commands speech and language fluent Cranial nerve II-XII: Pupils were equal round reactive to light. Extraocular movements were full, visual field were full on confrontational test. Facial sensation and strength were normal. Head turning and shoulder shrug  were normal and symmetric. Motor: The motor testing reveals 5 over 5 strength of all 4 extremities. Good symmetric motor tone is noted throughout.  Mild resting tremor  of the left hand noted.  Handwriting sample demonstrated  minimal tremor, spiral drawing was well-maintained.  No bradykinesia. Sensory: Sensory testing is intact to soft touch on all 4 extremities. No evidence of extinction is noted.  Coordination: Cerebellar testing reveals good finger-nose-finger and heel-to-shin bilaterally.  Minimal tremor with finger-nose-finger bilaterally. Gait and station: Gait is normal, good arm swing, pace, turns, tremor to left hand noted. Reflexes: Deep tendon reflexes are symmetric and normal bilaterally.   DIAGNOSTIC DATA (LABS, IMAGING, TESTING) - I reviewed patient records, labs, notes, testing and imaging myself where available.  Lab Results  Component Value Date   WBC 7.8 12/09/2019   HGB 14.5 12/09/2019   HCT 43.7 12/09/2019   MCV 90.5 12/09/2019   PLT 232 12/09/2019      Component Value Date/Time   NA 138 04/29/2019 0635   NA 140 03/03/2019 0913   K 4.3 04/29/2019 0635   CL 102 04/29/2019 0635   CO2 24 04/29/2019 0635   GLUCOSE 133 (H) 04/29/2019 0635   BUN 26 (H) 04/29/2019 0635   BUN 22 03/03/2019 0913   CREATININE 1.09 04/29/2019 0635   CALCIUM 8.8 (L) 04/29/2019 0635   PROT 6.3 11/19/2019 1002   ALBUMIN 4.2 11/19/2019 1002   AST 16 11/19/2019 1002   ALT 12 11/19/2019 1002   ALKPHOS 74 11/19/2019 1002   BILITOT 0.9 11/19/2019 1002   GFRNONAA >60 04/29/2019 0635   GFRAA >60 04/29/2019 0635   Lab Results  Component Value Date   CHOL 160 11/19/2019   HDL 49 11/19/2019   LDLCALC 89 11/19/2019   TRIG 126 11/19/2019   CHOLHDL 3.3 11/19/2019   No results found for: HGBA1C No results found for: VITAMINB12 No results found for: TSH  ASSESSMENT AND PLAN 74 y.o. year old male  has a past medical history of ADHD (attention deficit hyperactivity disorder), B12 deficiency, BPH (benign prostatic hypertrophy), Chronic lymphocytic leukemia (CLL), T-cell ([place]) (DX 1996--  ONCOLOGIST-  DR Benay Spice), Coronary artery disease (CARDIOLOGIST- DR Daneen Schick), Crohn's disease of ileum ([place]) ([place]),  ED (erectile dysfunction), Elevated PSA, Essential and other specified forms of tremor (11/25/2012), H/O adenomatous polyp of colon, Hyperlipidemia, Nocturia, OA (osteoarthritis), Peripheral neuropathy, Peyronie disease, S/P coronary artery stent placement (OCT 1999 OF LAD), and Tremor, hereditary, benign (MILD RIGHT HAND). here with:  1.  History of tremor, likely essential tremor  -Mild left resting tremor, also seen with walking, mild intention tremor with finger-nose-finger bilaterally, otherwise, no Parkinson features noted on exam  -Quite concerned with his family history of Parkinson's disease, he may be interested in a DaTscan in the future for peace of mind if insurance approved  -He wishes to hold off on medications for the tremor, it appears to be worse when upset or anxious  -Continue gait training with PT, encouraged to continue exercise and walking  -Keep follow-up with Dr. Jannifer Franklin in February  I spent 30 minutes of face-to-face and non-face-to-face time with patient.  This included previsit chart review, lab review, study review, order entry, electronic health record documentation, patient education.  Butler Denmark, AGNP-C, DNP 12/30/2019, 9:17 AM [facility] [address] [place], [place] [address] [phone]

## 2019-12-29 NOTE — Therapy (Signed)
[facility] [address] [place], [place], [address] Phone: [phone]   Fax:  [phone]  Physical Therapy Treatment  Patient Details  Name: Cole Martinez MRN: [id number] Date of Birth: November 04, 1945 Referring Provider (PT): Referred by: Delrae Rend, MD. PCP is Josetta Huddle, MD   Encounter Date: 12/29/2019   PT End of Session - 12/29/19 1232    Visit Number 8    Number of Visits 13    Date for PT Re-Evaluation 01/25/20   POC for 6 weeks, Cert for 60 days   Authorization Type Medicare (10th visit PN)    Progress Note Due on Visit 10    PT Start Time 1232    PT Stop Time 1314    PT Time Calculation (min) 42 min    Equipment Utilized During Treatment Gait belt    Activity Tolerance Patient tolerated treatment well    Behavior During Therapy [facility] for tasks assessed/performed           Past Medical History:  Diagnosis Date  . ADHD (attention deficit hyperactivity disorder)   . B12 deficiency   . BPH (benign prostatic hypertrophy)   . 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 Coronary artery disease CARDIOLOGIST- DR Daneen Schick   S/P STENTING LAD 1999 // Myoview 01/2019: EF 62, normal perfusion; Low Risk  . Crohn's disease of ileum ([place]) SINCE 1988  . ED (erectile dysfunction)   . Elevated PSA   . Essential and other specified forms of tremor 11/25/2012  . H/O adenomatous polyp of colon   . Hyperlipidemia   . Nocturia   . OA (osteoarthritis)   . Peripheral neuropathy    hx of, none current as of 08-04-13  . Peyronie disease   . S/P coronary artery stent placement OCT 1999 OF LAD  . Tremor, hereditary, benign MILD RIGHT HAND    Past Surgical History:  Procedure Laterality Date  . CARDIOVASCULAR STRESS TEST  11-01-2010 DR Daneen Schick   NORMAL PERFUSION STUDY/ EF 64%/ NO ISCHEMIA  . cataract surgery   Bilateral feburary 2020   with lens placement   . COLONOSCOPY WITH PROPOFOL N/A 09/24/2012   Procedure: COLONOSCOPY WITH PROPOFOL;  Surgeon: [name], MD;  [facility];  Service: Endoscopy;  Laterality: N/A;  . CORONARY ANGIOPLASTY WITH STENT PLACEMENT  OCT 1999   STENT OF LAD  . CORONARY STENT INTERVENTION N/A 02/14/2019   Procedure: CORONARY STENT INTERVENTION;  Surgeon: Belva Crome, MD;  Location: [place] [facility];  Service: Cardiovascular;  Laterality: N/A;  . CYSTOSCOPY WITH URETHRAL DILATATION  03/12/2017   Procedure: CYSTOSCOPY WITH URETHRAL DILATATION;  Surgeon: Gaynelle Arabian, MD;  [facility];  Service: Orthopedics;;  Ammie Dalton, Resident Assisting  . CYSTOSCOPY WITH URETHRAL DILATATION N/A 10/03/2018   Procedure: CYSTOSCOPY WITH URETHRAL BALLOON DILATATION WITH BILATERAL RETROGRADE PYELOGRAPHY;  Surgeon: Raynelle Bring, MD;  [facility];  Service: Urology;  Laterality: N/A;  . LAPAROSCOPIC INGUINAL HERNIA REPAIR Bilateral 01-10-2004   W/ MESH  . LEFT HEART CATH AND CORONARY ANGIOGRAPHY N/A 02/13/2019   Procedure: LEFT HEART CATH AND CORONARY ANGIOGRAPHY;  Surgeon: Belva Crome, MD;  Location: [place] [facility];  Service: Cardiovascular;  Laterality: N/A;  . neck benign removed from neck  yrs ago  . PROSTATE BIOPSY N/A 07/12/2012   Procedure: PROSTATE BIOPSY AND ULTRASOUND;  Surgeon:  Ailene Rud, MD;  Location: [facility];  Service: Urology;  Laterality: N/A;  . PROSTATE SURGERY  2002   tuna  . REMOVAL LEFT NECK LYMPH NODE  1996  . TONSILLECTOMY  CHILD  . TOTAL KNEE ARTHROPLASTY Right 03/12/2017   Procedure: RIGHT TOTAL KNEE ARTHROPLASTY;  Surgeon: Gaynelle Arabian, MD;  [facility];  Service: Orthopedics;  Laterality: Right;  Adductor Block  . TRANSURETHRAL RESECTION OF PROSTATE N/A 08/08/2013   Procedure: TRANSURETHRAL RESECTION OF THE PROSTATE WITH GYRUS INSTRUMENTS;  Surgeon: Ailene Rud, MD;  [facility];   Service: Urology;  Laterality: N/A;  . TRANSURETHRAL RESECTION OF PROSTATE      There were no vitals filed for this visit.   Subjective Assessment - 12/29/19 1234    Subjective Reports no new changes since last visit. No falls. Patient reports HEP addition went well.    Pertinent History ADHD. B12 Deficiency, BPH, Chronic Lymphocystic Leukemia, Crohn's Disease, CAD, Benign Tremor, Hyperlipidemia, Osteoporosis    Limitations Walking    Patient Stated Goals Be able to feel secure    Currently in Pain? Yes    Pain Score 5     Pain Location Back    Pain Orientation Lower    Pain Descriptors / Indicators Aching    Pain Type Chronic pain    Pain Onset More than a month ago               [facility] Adult PT Treatment/Exercise - 12/29/19 0001      Ambulation/Gait   Ambulation/Gait Yes    Ambulation/Gait Assistance 5: Supervision    Ambulation/Gait Assistance Details in/out of therapy session    Ambulation Distance (Feet) --   clinic distances   Assistive device None    Gait Pattern Step-through pattern;Narrow base of support    Ambulation Surface Level;Indoor               Balance Exercises - 12/29/19 0001      Balance Exercises: Standing   Standing Eyes Opened Narrow base of support (BOS);Head turns;Foam/compliant surface;Limitations    Standing Eyes Opened Limitations standing on blue mat with feet together completed horizontal/vertical head turns x 10 reps.     Standing Eyes Closed Narrow base of support (BOS);Head turns;Foam/compliant surface;3 reps;30 secs;Limitations    Standing Eyes Closed Time 30 secs    Standing Eyes Closed Limitations completed horizontal/vertical head turns 1 x 10 reps with vision removed, increased balance challenge required CGA from PT. increased sway with eye closed on blue mat.     Other Standing Exercises Completed marching with eyes open x 1 minute then progressed to marching with eyes closed x 1 minute.  increased challenge with vision removed,  require intermittent CGA from PT.             Neuro re-ed: sensory organization test performed with following results: Conditions: 1: All above age related norms 2: All above age related norms 3: below age related norm on 1st trial, above age related norm on 2nd + 3rd trial  4: All above age related norms 5: 1st trial below age related norm, 2nd + 3rd trial marked as "fall" 6: all trials marked as "fall" Composite score: 27 (below age related norm) Sensory Analysis Som: above age related norms Vis: above age related norms Vest: below age related norm (approx 15% currently)  Pref: above age related norms Strategy analysis: proper use of hip/ankle strategy (more ankle dominant)  COG alignment: WNL     PT educating on results of SOT regarding vestibular system function. Patient verbalized understanding. Copy of results provided to patient.     PT Short Term Goals - 12/24/19 6045      PT SHORT TERM GOAL #1   Title Patient will be independent with initial balance HEP (ALL STG's Due: 12/24/2019)    Baseline Reports independence, completing daily.    Time 3    Period Weeks    Status Achieved    Target Date 12/24/19   due to delay in scheduling     PT [place] #2   Title Patient will completed 5x sit <> stand in </= 10 seconds without UE support to demo improved functional strength    Baseline 12.59 secs, 9.59 secs w/o UE support    Time 3    Period Weeks    Status Achieved      PT SHORT TERM GOAL #3   Title Patient will improve FGA to >/= 19/30 to demonstrate reduced fall risk    Baseline 17/30, 20/30    Time 3    Period Weeks    Status Achieved             PT Long Term Goals - 11/26/19 1131      PT LONG TERM GOAL #1   Title Patient will be independent with final HEP for balance/strengthening (ALL LTGs Due: 01/14/20)    Baseline no HEP established    Time 6    Period Weeks    Status New    Target Date 01/14/20      PT LONG TERM GOAL #2   Title  Patient will improve FGA to >/= 22/30 to demonstrate improved balance and reduced risk for falls    Baseline 17/30    Time 6    Period Weeks    Status New      PT LONG TERM GOAL #3   Title Patient will complete TUG in </= 10 seconds to demonstrate reduced risk for falls    Baseline 11.41 secs    Time 6    Period Weeks    Status New      PT LONG TERM GOAL #4   Title Patient will demo ability to ambulate >500 ft on outdoor unlevel surfaces, Mod I to demonstrate improved community mobility    Baseline TBD    Time 6    Period Weeks    Status New                 Plan - 12/29/19 1509    Clinical Impression Statement Today's skilled PT session included assessment of balance with completion of Sensory Organization Test. Patient demonstrating increased difficulty with Condition 5 +6 with frequent trials marked as "Fall" due to requiring CGA from PT. Sensory Analysis shows vestibular function (approx 15%) is below age related norms. Continued balance exercises targeting vestibular system. Will continue to progress toward goals.    Personal Factors and Comorbidities Comorbidity 3+    Comorbidities ADHD. B12 Deficiency, BPH, Chronic Lymphocystic Leukemia, Crohn's Disease, CAD, Benign Tremor, Hyperlipidemia, Osteoperosis    Examination-Activity Limitations Stairs;Stand;Locomotion Level    Examination-Participation Restrictions Community Activity;Yard Work;Occupation    Stability/Clinical Decision Making Stable/Uncomplicated    Rehab Potential Good    PT Frequency 2x / week    PT Duration 6 weeks    PT Treatment/Interventions ADLs/Self Care Home Management;Cryotherapy;Moist Heat;DME Instruction;Stair training;Functional mobility training;Therapeutic activities;Gait training;Therapeutic exercise;Balance training;Neuromuscular re-education;Patient/family education;Orthotic Fit/Training;Manual techniques;Passive  range of motion;Vestibular    PT Next Visit Plan Continue balance exercises  targeting vestibular system. (rockerboard/balance beam) and incorporate postural exercises into balance. Walking with head turns. Dual Tasking    Consulted and Agree with Plan of Care Patient           Patient will benefit from skilled therapeutic intervention in order to improve the following deficits and impairments:  Abnormal gait, Decreased balance, Difficulty walking, Postural dysfunction, Pain, Decreased strength, Decreased activity tolerance, Improper body mechanics  Visit Diagnosis: History of falling  Abnormal posture  Other abnormalities of gait and mobility  Unsteadiness on feet  Muscle weakness (generalized)     Problem List Patient Active Problem List   Diagnosis Date Noted  . Angina pectoris ([place]) 02/13/2019  . Pseudophakia of both eyes 08/01/2018  . History of total knee arthroplasty 03/29/2017  . Stiffness of right knee 03/16/2017  . OA (osteoarthritis) of knee 03/12/2017  . Tremor, essential 12/03/2015  . Essential hypertension 12/09/2014  . Coronary artery disease involving native heart 12/08/2013  . 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[place]) 12/08/2013  . Hyperlipidemia 12/08/2013  . Benign prostatic hypertrophy 08/08/2013  . Essential and other specified forms of tremor 11/25/2012    Jones Bales, PT, DPT 12/29/2019, 3:11 PM  [facility] [address] [place], [place], [address] Phone: [phone]   Fax:  [phone]  Name: DANNI LEABO MRN: [id number] Date of Birth: 05-23-1945

## 2019-12-29 NOTE — Telephone Encounter (Signed)
Patient requesting a sooner apt for tremors. He has a follow up in February but states the tremors are worsening and requesting to get in sooner. Next available was in March now. His best call back is [phone]

## 2019-12-30 ENCOUNTER — Ambulatory Visit (INDEPENDENT_AMBULATORY_CARE_PROVIDER_SITE_OTHER): Payer: Medicare Other | Admitting: Neurology

## 2019-12-30 ENCOUNTER — Encounter: Payer: Self-pay | Admitting: Neurology

## 2019-12-30 VITALS — BP 127/79 | HR 61 | Ht 68.0 in | Wt 180.8 lb

## 2019-12-30 DIAGNOSIS — G250 Essential tremor: Secondary | ICD-10-CM

## 2019-12-30 NOTE — Patient Instructions (Signed)
Great to see you today We'll hold off on other medications  Continue with your therapy  Keep appointment with Dr. Jannifer Franklin in Feb

## 2019-12-30 NOTE — Progress Notes (Signed)
I have read the note, and I agree with the clinical assessment and plan.  Jerney Baksh K Caswell [place]   

## 2019-12-31 ENCOUNTER — Ambulatory Visit: Payer: Medicare Other

## 2019-12-31 ENCOUNTER — Other Ambulatory Visit: Payer: Self-pay

## 2019-12-31 DIAGNOSIS — R293 Abnormal posture: Secondary | ICD-10-CM

## 2019-12-31 DIAGNOSIS — Z9181 History of falling: Secondary | ICD-10-CM

## 2019-12-31 DIAGNOSIS — M6281 Muscle weakness (generalized): Secondary | ICD-10-CM

## 2019-12-31 DIAGNOSIS — R2689 Other abnormalities of gait and mobility: Secondary | ICD-10-CM

## 2019-12-31 DIAGNOSIS — R2681 Unsteadiness on feet: Secondary | ICD-10-CM | POA: Diagnosis not present

## 2019-12-31 NOTE — Therapy (Signed)
[facility] [address] [place], [place], [address] Phone: [phone]   Fax:  [phone]  Physical Therapy Treatment  Patient Details  Name: Cole Martinez MRN: [id number] Date of Birth: [birth year] Referring Provider (PT): Referred by: Delrae Rend, MD. PCP is Josetta Huddle, MD   Encounter Date: 12/31/2019   PT End of Session - 12/31/19 1019    Visit Number 9    Number of Visits 13    Date for PT Re-Evaluation 01/25/20   POC for 6 weeks, Cert for 60 days   Authorization Type Medicare (10th visit PN)    Progress Note Due on Visit 10    PT Start Time 1017    PT Stop Time 1058    PT Time Calculation (min) 41 min    Equipment Utilized During Treatment Gait belt    Activity Tolerance Patient tolerated treatment well    Behavior During Therapy WFL for tasks assessed/performed           Past Medical History:  Diagnosis Date  . ADHD (attention deficit hyperactivity disorder)   . B12 deficiency   . BPH (benign prostatic hypertrophy)   . 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   PT IS ASYMPTOMATIC--- LAST CBC W/ DIFF 06-24-2012 STABLE  . Coronary artery disease CARDIOLOGIST- DR Daneen Schick   S/P STENTING LAD 1999 // Myoview 01/2019: EF 62, normal perfusion; Low Risk  . Crohn's disease of ileum ([place]) SINCE 1988  . ED (erectile dysfunction)   . Elevated PSA   . Essential and other specified forms of tremor 11/25/2012  . H/O adenomatous polyp of colon   . Hyperlipidemia   . Nocturia   . OA (osteoarthritis)   . Peripheral neuropathy    hx of, none current as of 08-04-13  . Peyronie disease   . S/P coronary artery stent placement OCT 1999 OF LAD  . Tremor, hereditary, benign MILD RIGHT HAND    Past Surgical History:  Procedure Laterality Date  . CARDIOVASCULAR STRESS TEST  11-01-2010 DR Daneen Schick   NORMAL PERFUSION STUDY/ EF 64%/ NO ISCHEMIA  . cataract surgery   Bilateral feburary 2020   with lens placement   . COLONOSCOPY WITH PROPOFOL N/A 09/24/2012   Procedure: COLONOSCOPY WITH PROPOFOL;  Surgeon: [name], MD;  [facility];  Service: Endoscopy;  Laterality: N/A;  . CORONARY ANGIOPLASTY WITH STENT PLACEMENT  OCT 1999   STENT OF LAD  . CORONARY STENT INTERVENTION N/A 02/14/2019   Procedure: CORONARY STENT INTERVENTION;  Surgeon: Belva Crome, MD;  Location: [place] [facility];  Service: Cardiovascular;  Laterality: N/A;  . CYSTOSCOPY WITH URETHRAL DILATATION  03/12/2017   Procedure: CYSTOSCOPY WITH URETHRAL DILATATION;  Surgeon: Gaynelle Arabian, MD;  [facility];  Service: Orthopedics;;  Ammie Dalton, Resident Assisting  . CYSTOSCOPY WITH URETHRAL DILATATION N/A 10/03/2018   Procedure: CYSTOSCOPY WITH URETHRAL BALLOON DILATATION WITH BILATERAL RETROGRADE PYELOGRAPHY;  Surgeon: Raynelle Bring, MD;  [facility];  Service: Urology;  Laterality: N/A;  . LAPAROSCOPIC INGUINAL HERNIA REPAIR Bilateral 01-10-2004   W/ MESH  . LEFT HEART CATH AND CORONARY ANGIOGRAPHY N/A 02/13/2019   Procedure: LEFT HEART CATH AND CORONARY ANGIOGRAPHY;  Surgeon: Belva Crome, MD;  Location: [place] [facility];  Service: Cardiovascular;  Laterality: N/A;  . neck benign removed from neck  yrs ago  . PROSTATE BIOPSY N/A 07/12/2012   Procedure: PROSTATE BIOPSY AND ULTRASOUND;  Surgeon:  Ailene Rud, MD;  Location: [facility];  Service: Urology;  Laterality: N/A;  . PROSTATE SURGERY  2002   tuna  . REMOVAL LEFT NECK LYMPH NODE  1996  . TONSILLECTOMY  CHILD  . TOTAL KNEE ARTHROPLASTY Right 03/12/2017   Procedure: RIGHT TOTAL KNEE ARTHROPLASTY;  Surgeon: Gaynelle Arabian, MD;  [facility];  Service: Orthopedics;  Laterality: Right;  Adductor Block  . TRANSURETHRAL RESECTION OF PROSTATE N/A 08/08/2013   Procedure: TRANSURETHRAL RESECTION OF THE PROSTATE WITH GYRUS INSTRUMENTS;  Surgeon: Ailene Rud, MD;  [facility];   Service: Urology;  Laterality: N/A;  . TRANSURETHRAL RESECTION OF PROSTATE      There were no vitals filed for this visit.   Subjective Assessment - 12/31/19 1020    Subjective Reports that had appt with [facility] yesterday that went well. Does not want to be on medications for tremor at this time per patient reports. No falls.    Pertinent History ADHD. B12 Deficiency, BPH, Chronic Lymphocystic Leukemia, Crohn's Disease, CAD, Benign Tremor, Hyperlipidemia, Osteoporosis    Limitations Walking    Patient Stated Goals Be able to feel secure    Currently in Pain? Yes    Pain Score 5     Pain Location Back    Pain Orientation Lower    Pain Descriptors / Indicators Aching    Pain Type Chronic pain    Pain Onset More than a month ago                             [facility] Adult PT Treatment/Exercise - 12/31/19 0001      Ambulation/Gait   Ambulation/Gait Yes    Ambulation/Gait Assistance 5: Supervision    Ambulation/Gait Assistance Details in/out of therapy session    Ambulation Distance (Feet) --   clinic distances   Assistive device None    Gait Pattern Step-through pattern;Narrow base of support    Ambulation Surface Level;Indoor      Neuro Re-ed    Neuro Re-ed Details  In // bars standing on airex pad completed self ball toss with visual tracking x 15 reps, then progressed to clockwise/counterclockwise circles with visual tracking 2 x 10 reps each. completed initially with wide BOS and progressed to narrow BOS, increased challenge with narrow BOS noted. Standing on airexpad completed mini squats x 10 reps with EO, then x 10 reps with EC. intermittent UE suppport and CGA required to maintain balance. Standing on airex in corner, completed alternating trunk rotation with multidirectional taps to wall, patient demo increased challenge with taps to lower height often demo LOB posteriorly and requiring CGA from PT.                Balance Exercises - 12/31/19 1022       Balance Exercises: Standing   Standing Eyes Opened Narrow base of support (BOS);Head turns;Foam/compliant surface;Limitations    Standing Eyes Opened Limitations standing on blue mat with feet together completed horizontal/vertical head turns x 10 reps. progressed to completing with vision removed x 10 reps each.     Standing Eyes Closed Narrow base of support (BOS);Foam/compliant surface;3 reps;30 secs;Limitations    Standing Eyes Closed Time 30 secs    Standing Eyes Closed Limitations continue to demo increased sway with EC    Tandem Stance Eyes open;Intermittent upper extremity support;3 reps;30 secs;Limitations    Tandem Stance Time completed intially with EO anf full tandem 3 x 30  seconds bilatearlly, then progressed to visoin removed but only able to complete with partial tandem today, 2 x 30 seconds each.     Wall Bumps Hip;Eyes opened;15 reps;Limitations    Wall Bumps Limitations completed x 15 reps on wall, verbal cues for completion.     Rockerboard Anterior/posterior;EO;EC;Intermittent UE support;Limitations    Rockerboard Limitations standing on rockerboard A/P completed weight shifts anterior/posterior with EO x 10 reps without UE support. PT noticing that patient keeps weight on heels, requiring verbal cues from PT. Completed ant/posterior weight shift with vision removed x 10 reps increased balance challenge noted with completion.                PT Short Term Goals - 12/24/19 2831      PT SHORT TERM GOAL #1   Title Patient will be independent with initial balance HEP (ALL STG's Due: 12/24/2019)    Baseline Reports independence, completing daily.    Time 3    Period Weeks    Status Achieved    Target Date 12/24/19   due to delay in scheduling     PT [place] #2   Title Patient will completed 5x sit <> stand in </= 10 seconds without UE support to demo improved functional strength    Baseline 12.59 secs, 9.59 secs w/o UE support    Time 3    Period Weeks     Status Achieved      PT SHORT TERM GOAL #3   Title Patient will improve FGA to >/= 19/30 to demonstrate reduced fall risk    Baseline 17/30, 20/30    Time 3    Period Weeks    Status Achieved             PT Long Term Goals - 11/26/19 1131      PT LONG TERM GOAL #1   Title Patient will be independent with final HEP for balance/strengthening (ALL LTGs Due: 01/14/20)    Baseline no HEP established    Time 6    Period Weeks    Status New    Target Date 01/14/20      PT LONG TERM GOAL #2   Title Patient will improve FGA to >/= 22/30 to demonstrate improved balance and reduced risk for falls    Baseline 17/30    Time 6    Period Weeks    Status New      PT LONG TERM GOAL #3   Title Patient will complete TUG in </= 10 seconds to demonstrate reduced risk for falls    Baseline 11.41 secs    Time 6    Period Weeks    Status New      PT LONG TERM GOAL #4   Title Patient will demo ability to ambulate >500 ft on outdoor unlevel surfaces, Mod I to demonstrate improved community mobility    Baseline TBD    Time 6    Period Weeks    Status New                 Plan - 12/31/19 1212    Clinical Impression Statement Today's skilled PT session included continued progression of balance exercises, with focus on exercises targeting vestibular system to promote improvements in balance. Will continue to progress as tolerated by patient and towards all LTGs.    Personal Factors and Comorbidities Comorbidity 3+    Comorbidities ADHD. B12 Deficiency, BPH, Chronic Lymphocystic Leukemia, Crohn's Disease, CAD, Benign Tremor, Hyperlipidemia, Osteoperosis  Examination-Activity Limitations Stairs;Stand;Locomotion Level    Examination-Participation Restrictions Community Activity;Yard Work;Occupation    Stability/Clinical Decision Making Stable/Uncomplicated    Rehab Potential Good    PT Frequency 2x / week    PT Duration 6 weeks    PT Treatment/Interventions ADLs/Self Care Home  Management;Cryotherapy;Moist Heat;DME Instruction;Stair training;Functional mobility training;Therapeutic activities;Gait training;Therapeutic exercise;Balance training;Neuromuscular re-education;Patient/family education;Orthotic Fit/Training;Manual techniques;Passive range of motion;Vestibular    PT Next Visit Plan Continue balance exercises targeting vestibular system. (rockerboard/balance beam) and incorporate postural exercises into balance. Walking with head turns. Dual Tasking    Consulted and Agree with Plan of Care Patient           Patient will benefit from skilled therapeutic intervention in order to improve the following deficits and impairments:  Abnormal gait, Decreased balance, Difficulty walking, Postural dysfunction, Pain, Decreased strength, Decreased activity tolerance, Improper body mechanics  Visit Diagnosis: History of falling  Abnormal posture  Other abnormalities of gait and mobility  Unsteadiness on feet  Muscle weakness (generalized)     Problem List Patient Active Problem List   Diagnosis Date Noted  . Angina pectoris ([place]) 02/13/2019  . Pseudophakia of both eyes 08/01/2018  . History of total knee arthroplasty 03/29/2017  . Stiffness of right knee 03/16/2017  . OA (osteoarthritis) of knee 03/12/2017  . Tremor, essential 12/03/2015  . Essential hypertension 12/09/2014  . Coronary artery disease involving native heart 12/08/2013  . 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[place]) 12/08/2013  . Hyperlipidemia 12/08/2013  . Benign prostatic hypertrophy 08/08/2013  . Essential and other specified forms of tremor 11/25/2012    Jones Bales, PT, DPT 12/31/2019, 12:16 PM  [facility] [address] [place], [place], [address] Phone: [phone]   Fax:  [phone]  Name: Cole Martinez MRN: [id number] Date of Birth: Jul 30, 1945

## 2020-01-04 ENCOUNTER — Other Ambulatory Visit: Payer: Self-pay | Admitting: Cardiology

## 2020-01-05 ENCOUNTER — Other Ambulatory Visit: Payer: Self-pay

## 2020-01-05 ENCOUNTER — Ambulatory Visit: Payer: Medicare Other | Attending: Internal Medicine

## 2020-01-05 DIAGNOSIS — R2681 Unsteadiness on feet: Secondary | ICD-10-CM | POA: Insufficient documentation

## 2020-01-05 DIAGNOSIS — M6281 Muscle weakness (generalized): Secondary | ICD-10-CM | POA: Diagnosis not present

## 2020-01-05 DIAGNOSIS — R2689 Other abnormalities of gait and mobility: Secondary | ICD-10-CM | POA: Diagnosis not present

## 2020-01-05 DIAGNOSIS — R293 Abnormal posture: Secondary | ICD-10-CM | POA: Diagnosis not present

## 2020-01-05 DIAGNOSIS — Z9181 History of falling: Secondary | ICD-10-CM | POA: Diagnosis not present

## 2020-01-05 NOTE — Patient Instructions (Signed)
Access Code: [email or web address] URL: [email or web address] Date: 01/05/2020 Prepared by: Baldomero Lamy  Exercises Romberg Stance with Eyes Closed - 1 x daily - 5 x weekly - 1 sets - 3 reps - 30 hold Feet Apart with Head Nods on Foam Pad - 1 x daily - 5 x weekly - 2 sets - 10 reps Tandem Stance - 1 x daily - 5 x weekly - 1 sets - 6 reps - 30 hold Single Leg Stance with Support - 1 x daily - 5 x weekly - 1 sets - 3 reps - 5-15 secs hold Tandem Walking with Counter Support - 1 x daily - 5 x weekly - 3 sets Walking March with Countertop Support - 1 x daily - 5 x weekly - 3 sets Scapular Retraction with Resistance - 1 x daily - 5 x weekly - 2 sets - 10 reps - 3 hold Doorway Pec Stretch at 60 Elevation - 1 x daily - 5 x weekly - 1 sets - 3 reps - 30 hold Supine Chin Tuck - 1 x daily - 5 x weekly - 2 sets - 10 reps - 3 hold Standing Shoulder Horizontal Abduction with Resistance - 1 x daily - 5 x weekly - 2 sets - 10 reps - 2-3 hold

## 2020-01-05 NOTE — Therapy (Signed)
[facility] [address] [place], [place], [address] Phone: [phone]   Fax:  [phone]  Physical Therapy Treatment/Progress Note  Patient Details  Name: Cole Martinez MRN: [id number] Date of Birth: [birth year] Referring Provider (PT): Referred by: Delrae Rend, MD. PCP is Josetta Huddle, MD  Physical Therapy Progress Note   Dates of Reporting Period: 11/26/2019 - 01/05/20  See Note below for Objective Data and Assessment of Progress/Goals.  Thank you for the referral of this patient. Guillermina [place], PT, DPT  Encounter Date: 01/05/2020   PT End of Session - 01/05/20 1021    Visit Number 10    Number of Visits 13    Date for PT Re-Evaluation 01/25/20   POC for 6 weeks, Cert for 60 days   Authorization Type Medicare (10th visit PN)    Progress Note Due on Visit 10    PT Start Time 1017    PT Stop Time 1100    PT Time Calculation (min) 43 min    Equipment Utilized During Treatment Gait belt    Activity Tolerance Patient tolerated treatment well    Behavior During Therapy WFL for tasks assessed/performed           Past Medical History:  Diagnosis Date   ADHD (attention deficit hyperactivity disorder)    B12 deficiency    BPH (benign prostatic hypertrophy)    Chronic lymphocytic leukemia (CLL), 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--  ONCOLOGIST-  DR Benay Spice   PT IS ASYMPTOMATIC--- LAST CBC W/ DIFF 06-24-2012 STABLE   Coronary artery disease CARDIOLOGIST- DR Daneen Schick   S/P STENTING LAD 1999 // Myoview 01/2019: EF 62, normal perfusion; Low Risk   Crohn's disease of ileum ([place]) [place]   ED (erectile dysfunction)    Elevated PSA    Essential and other specified forms of tremor 11/25/2012   H/O adenomatous polyp of colon    Hyperlipidemia    Nocturia    OA (osteoarthritis)    Peripheral neuropathy    hx of, none current as of 08-04-13   Peyronie disease    S/P coronary artery stent placement OCT 1999 OF  LAD   Tremor, hereditary, benign MILD RIGHT HAND    Past Surgical History:  Procedure Laterality Date   CARDIOVASCULAR STRESS TEST  11-01-2010 DR Daneen Schick   NORMAL PERFUSION STUDY/ EF 64%/ NO ISCHEMIA   cataract surgery  Bilateral feburary 2020   with lens placement    COLONOSCOPY WITH PROPOFOL N/A 09/24/2012   Procedure: COLONOSCOPY WITH PROPOFOL;  Surgeon: [name], MD;  Location: [facility];  Service: Endoscopy;  Laterality: N/A;   CORONARY ANGIOPLASTY WITH STENT PLACEMENT  OCT 1999   STENT OF LAD   CORONARY STENT INTERVENTION N/A 02/14/2019   Procedure: CORONARY STENT INTERVENTION;  Surgeon: Belva Crome, MD;  Location: [facility];  Service: Cardiovascular;  Laterality: N/A;   CYSTOSCOPY WITH URETHRAL DILATATION  03/12/2017   Procedure: CYSTOSCOPY WITH URETHRAL DILATATION;  Surgeon: Gaynelle Arabian, MD;  [facility]: [facility];  Service: Orthopedics;;  Ammie Dalton, Resident Assisting   CYSTOSCOPY WITH URETHRAL DILATATION N/A 10/03/2018   Procedure: CYSTOSCOPY WITH URETHRAL BALLOON DILATATION WITH BILATERAL RETROGRADE PYELOGRAPHY;  Surgeon: Raynelle Bring, MD;  [facility];  Service: Urology;  Laterality: N/A;   LAPAROSCOPIC INGUINAL HERNIA REPAIR Bilateral 01-10-2004   W/ MESH   LEFT HEART CATH AND CORONARY ANGIOGRAPHY N/A 02/13/2019   Procedure: LEFT HEART CATH AND CORONARY ANGIOGRAPHY;  Surgeon: Belva Crome,  MD;  Location: [facility];  Service: Cardiovascular;  Laterality: N/A;   neck benign removed from neck  yrs ago   PROSTATE BIOPSY N/A 07/12/2012   Procedure: PROSTATE BIOPSY AND ULTRASOUND;  Surgeon: Ailene Rud, MD;  [facility];  Service: Urology;  Laterality: N/A;   PROSTATE SURGERY  2002   tuna   REMOVAL LEFT NECK LYMPH NODE  1996   TONSILLECTOMY  CHILD   TOTAL KNEE ARTHROPLASTY Right 03/12/2017   Procedure: RIGHT TOTAL KNEE ARTHROPLASTY;  Surgeon: Gaynelle Arabian, MD;  [facility]: [facility];  Service:  Orthopedics;  Laterality: Right;  Adductor Block   TRANSURETHRAL RESECTION OF PROSTATE N/A 08/08/2013   Procedure: TRANSURETHRAL RESECTION OF THE PROSTATE WITH GYRUS INSTRUMENTS;  Surgeon: Ailene Rud, MD;  [facility];  Service: Urology;  Laterality: N/A;   TRANSURETHRAL RESECTION OF PROSTATE      There were no vitals filed for this visit.   Subjective Assessment - 01/05/20 1020    Subjective Patient reports had a good weekend. Requesting to review scapular retraction. No falls or near falls to report.    Pertinent History ADHD. B12 Deficiency, BPH, Chronic Lymphocystic Leukemia, Crohn's Disease, CAD, Benign Tremor, Hyperlipidemia, Osteoporosis    Limitations Walking    Patient Stated Goals Be able to feel secure    Currently in Pain? Yes    Pain Score 5     Pain Location Back    Pain Orientation Lower    Pain Descriptors / Indicators Aching    Pain Type Chronic pain    Pain Onset More than a month ago                             [facility] Adult PT Treatment/Exercise - 01/05/20 1042      Transfers   Transfers Sit to Stand;Stand to Sit    Sit to Stand 4: Min guard    Stand to Sit 4: Min guard    Number of Reps 10 reps;2 sets    Comments with BLEs placed on airex pad, completed sit <> stands without UE support, completed 2 x 10 reps. 2-3 instances of patient using BLE against mat to stabilize but able to correct with verbal cues.       Ambulation/Gait   Ambulation/Gait Yes    Ambulation/Gait Assistance 5: Supervision    Ambulation/Gait Assistance Details with completion of balance activites and throughout therapy session    Assistive device None    Gait Pattern Step-through pattern;Narrow base of support    Ambulation Surface Level;Indoor      High Level Balance   High Level Balance Activities Head turns;Other (comment)    High Level Balance Comments Completed ambulation with horizontal/vertical head turns x 115 ft each, increased balance challenge  with horizontal > vertical requiring CGA from PT. To further challenge vestibular system, completed ambulation with self ball toss and visual tracking x 115 ft. Verbal cues required to ensure visual tracking with completion.       Neuro Re-ed    Neuro Re-ed Details  Completed M-CSTIB. Patient completing situation 1-3 for full 30 seconds, situation 4 on average <5 seconds.       Exercises   Exercises Other Exercises    Other Exercises  Completed review of resisted scapular retraction with red theraband, completed 2 x 10 reps with 3 second hold. PT providing verbal cues for slowed pace, improved technique/form to promoete improved  posture. Completed "W" Pec Doorway Stretch 3 x 30 seconds, PT providing verbal cues for proper form as patient leaning forwad and completing with abnormal posture intially, improved form with tactile/verbal cues. Standing with back against door frame to promote improved posture and scapular retraction completed horizontal abduction x 10 reps with red theraband, verbal cues for form/technique with completion               Balance Exercises - 01/05/20 1206      Balance Exercises: Standing   Standing Eyes Closed Wide (BOA);Foam/compliant surface;4 reps;30 secs    Standing Eyes Closed Time 30 secs    Standing Eyes Closed Limitations continue to demo increased sway with EC             PT Education - 01/05/20 1211    Education Details HEP update (horizontal abduction with resistance)    Person(s) Educated Patient    Methods Explanation;Demonstration;Handout    Comprehension Verbalized understanding;Returned demonstration;Verbal cues required            PT Short Term Goals - 12/24/19 0721      PT SHORT TERM GOAL #1   Title Patient will be independent with initial balance HEP (ALL STG's Due: 12/24/2019)    Baseline Reports independence, completing daily.    Time 3    Period Weeks    Status Achieved    Target Date 12/24/19   due to delay in scheduling       PT [place] #2   Title Patient will completed 5x sit <> stand in </= 10 seconds without UE support to demo improved functional strength    Baseline 12.59 secs, 9.59 secs w/o UE support    Time 3    Period Weeks    Status Achieved      PT SHORT TERM GOAL #3   Title Patient will improve FGA to >/= 19/30 to demonstrate reduced fall risk    Baseline 17/30, 20/30    Time 3    Period Weeks    Status Achieved             PT Long Term Goals - 11/26/19 1131      PT LONG TERM GOAL #1   Title Patient will be independent with final HEP for balance/strengthening (ALL LTGs Due: 01/14/20)    Baseline no HEP established    Time 6    Period Weeks    Status New    Target Date 01/14/20      PT LONG TERM GOAL #2   Title Patient will improve FGA to >/= 22/30 to demonstrate improved balance and reduced risk for falls    Baseline 17/30    Time 6    Period Weeks    Status New      PT LONG TERM GOAL #3   Title Patient will complete TUG in </= 10 seconds to demonstrate reduced risk for falls    Baseline 11.41 secs    Time 6    Period Weeks    Status New      PT LONG TERM GOAL #4   Title Patient will demo ability to ambulate >500 ft on outdoor unlevel surfaces, Mod I to demonstrate improved community mobility    Baseline TBD    Time 6    Period Weeks    Status New                 Plan - 01/05/20 1213    Clinical Impression Statement  Today's skilled PT session included completion of M-CTSIB, and progression of balance exercises. Patient is demonstrating progress in terms of improved balance and improved posture. However still demonstrate decreased balance with vision removed due to decreased vestibular input. Patient will benefit form continue PT services to progress toward all LTGs and further reduce fall risk.    Personal Factors and Comorbidities Comorbidity 3+    Comorbidities ADHD. B12 Deficiency, BPH, Chronic Lymphocystic Leukemia, Crohn's Disease, CAD, Benign  Tremor, Hyperlipidemia, Osteoperosis    Examination-Activity Limitations Stairs;Stand;Locomotion Level    Examination-Participation Restrictions Community Activity;Yard Work;Occupation    Stability/Clinical Decision Making Stable/Uncomplicated    Rehab Potential Good    PT Frequency 2x / week    PT Duration 6 weeks    PT Treatment/Interventions ADLs/Self Care Home Management;Cryotherapy;Moist Heat;DME Instruction;Stair training;Functional mobility training;Therapeutic activities;Gait training;Therapeutic exercise;Balance training;Neuromuscular re-education;Patient/family education;Orthotic Fit/Training;Manual techniques;Passive range of motion;Vestibular    PT Next Visit Plan Continue balance exercises targeting vestibular system. (rockerboard/balance beam) and incorporate postural exercises into balance. Walking with head turns. Dual Tasking    Consulted and Agree with Plan of Care Patient           Patient will benefit from skilled therapeutic intervention in order to improve the following deficits and impairments:  Abnormal gait, Decreased balance, Difficulty walking, Postural dysfunction, Pain, Decreased strength, Decreased activity tolerance, Improper body mechanics  Visit Diagnosis: History of falling  Abnormal posture  Other abnormalities of gait and mobility  Unsteadiness on feet  Muscle weakness (generalized)     Problem List Patient Active Problem List   Diagnosis Date Noted   Angina pectoris ([place]) 02/13/2019   Pseudophakia of both eyes 08/01/2018   History of total knee arthroplasty 03/29/2017   Stiffness of right knee 03/16/2017   OA (osteoarthritis) of knee 03/12/2017   Tremor, essential 12/03/2015   Essential hypertension 12/09/2014   Coronary artery disease involving native heart 12/08/2013   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 [place], [place], [address] Phone: [phone]   Fax:  [phone]  Name: Cole Martinez MRN: [id number] Date of Birth: 11/16/1945

## 2020-01-06 NOTE — Telephone Encounter (Signed)
Pt's pharmacy is requesting a refill on pantoprazole. Would Dr. Tamala Julian like tor refill this medication? Please address

## 2020-01-07 ENCOUNTER — Ambulatory Visit: Payer: Medicare Other

## 2020-01-07 ENCOUNTER — Other Ambulatory Visit: Payer: Self-pay

## 2020-01-07 DIAGNOSIS — R2689 Other abnormalities of gait and mobility: Secondary | ICD-10-CM | POA: Diagnosis not present

## 2020-01-07 DIAGNOSIS — R293 Abnormal posture: Secondary | ICD-10-CM

## 2020-01-07 DIAGNOSIS — M6281 Muscle weakness (generalized): Secondary | ICD-10-CM

## 2020-01-07 DIAGNOSIS — Z9181 History of falling: Secondary | ICD-10-CM | POA: Diagnosis not present

## 2020-01-07 DIAGNOSIS — R2681 Unsteadiness on feet: Secondary | ICD-10-CM

## 2020-01-07 NOTE — Therapy (Signed)
[facility] [address] [place], [place], [address] Phone: [phone]   Fax:  [phone]  Physical Therapy Treatment  Patient Details  Name: Cole Martinez MRN: [id number] Date of Birth: 1945/10/23 Referring Provider (PT): Referred by: Delrae Rend, MD. PCP is Josetta Huddle, MD   Encounter Date: 01/07/2020   PT End of Session - 01/07/20 1019    Visit Number 11    Number of Visits 13    Date for PT Re-Evaluation 01/25/20   POC for 6 weeks, Cert for 60 days   Authorization Type Medicare (10th visit PN)    Progress Note Due on Visit 10    PT Start Time 1016    PT Stop Time 1058    PT Time Calculation (min) 42 min    Equipment Utilized During Treatment Gait belt    Activity Tolerance Patient tolerated treatment well    Behavior During Therapy WFL for tasks assessed/performed           Past Medical History:  Diagnosis Date  . ADHD (attention deficit hyperactivity disorder)   . B12 deficiency   . BPH (benign prostatic hypertrophy)   . 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 Coronary artery disease CARDIOLOGIST- DR Daneen Schick   S/P STENTING LAD 1999 // Myoview 01/2019: EF 62, normal perfusion; Low Risk  . Crohn's disease of ileum ([place]) SINCE 1988  . ED (erectile dysfunction)   . Elevated PSA   . Essential and other specified forms of tremor 11/25/2012  . H/O adenomatous polyp of colon   . Hyperlipidemia   . Nocturia   . OA (osteoarthritis)   . Peripheral neuropathy    hx of, none current as of 08-04-13  . Peyronie disease   . S/P coronary artery stent placement OCT 1999 OF LAD  . Tremor, hereditary, benign MILD RIGHT HAND    Past Surgical History:  Procedure Laterality Date  . CARDIOVASCULAR STRESS TEST  11-01-2010 DR Daneen Schick   NORMAL PERFUSION STUDY/ EF 64%/ NO ISCHEMIA  . cataract surgery   Bilateral feburary 2020   with lens placement   . COLONOSCOPY WITH PROPOFOL N/A 09/24/2012   Procedure: COLONOSCOPY WITH PROPOFOL;  Surgeon: [name], MD;  [facility];  Service: Endoscopy;  Laterality: N/A;  . CORONARY ANGIOPLASTY WITH STENT PLACEMENT  OCT 1999   STENT OF LAD  . CORONARY STENT INTERVENTION N/A 02/14/2019   Procedure: CORONARY STENT INTERVENTION;  Surgeon: Belva Crome, MD;  Location: [facility];  Service: Cardiovascular;  Laterality: N/A;  . CYSTOSCOPY WITH URETHRAL DILATATION  03/12/2017   Procedure: CYSTOSCOPY WITH URETHRAL DILATATION;  Surgeon: Gaynelle Arabian, MD;  [facility];  Service: Orthopedics;;  Ammie Dalton, Resident Assisting  . CYSTOSCOPY WITH URETHRAL DILATATION N/A 10/03/2018   Procedure: CYSTOSCOPY WITH URETHRAL BALLOON DILATATION WITH BILATERAL RETROGRADE PYELOGRAPHY;  Surgeon: Raynelle Bring, MD;  [facility];  Service: Urology;  Laterality: N/A;  . LAPAROSCOPIC INGUINAL HERNIA REPAIR Bilateral 01-10-2004   W/ MESH  . LEFT HEART CATH AND CORONARY ANGIOGRAPHY N/A 02/13/2019   Procedure: LEFT HEART CATH AND CORONARY ANGIOGRAPHY;  Surgeon: Belva Crome, MD;  Location: [facility];  Service: Cardiovascular;  Laterality: N/A;  . neck benign removed from neck  yrs ago  . PROSTATE BIOPSY N/A 07/12/2012   Procedure: PROSTATE BIOPSY AND ULTRASOUND;  Surgeon:  Ailene Rud, MD;  Location: [facility];  Service: Urology;  Laterality: N/A;  . PROSTATE SURGERY  2002   tuna  . REMOVAL LEFT NECK LYMPH NODE  1996  . TONSILLECTOMY  CHILD  . TOTAL KNEE ARTHROPLASTY Right 03/12/2017   Procedure: RIGHT TOTAL KNEE ARTHROPLASTY;  Surgeon: Gaynelle Arabian, MD;  [facility];  Service: Orthopedics;  Laterality: Right;  Adductor Block  . TRANSURETHRAL RESECTION OF PROSTATE N/A 08/08/2013   Procedure: TRANSURETHRAL RESECTION OF THE PROSTATE WITH GYRUS INSTRUMENTS;  Surgeon: Ailene Rud, MD;  [facility];   Service: Urology;  Laterality: N/A;  . TRANSURETHRAL RESECTION OF PROSTATE      There were no vitals filed for this visit.   Subjective Assessment - 01/07/20 1019    Subjective Patient reports HEP addition went well. Reports still feel like balance with eyes closed is challenging. No falls.    Pertinent History ADHD. B12 Deficiency, BPH, Chronic Lymphocystic Leukemia, Crohn's Disease, CAD, Benign Tremor, Hyperlipidemia, Osteoporosis    Limitations Walking    Patient Stated Goals Be able to feel secure    Currently in Pain? Yes    Pain Score 4     Pain Location Back    Pain Orientation Lower    Pain Descriptors / Indicators Aching    Pain Type Chronic pain    Pain Onset More than a month ago                             [facility] Adult PT Treatment/Exercise - 01/07/20 0001      Ambulation/Gait   Ambulation/Gait Yes    Ambulation/Gait Assistance 5: Supervision    Ambulation/Gait Assistance Details with completion of high level balance    Assistive device None    Gait Pattern Step-through pattern;Narrow base of support    Ambulation Surface Level;Indoor      High Level Balance   High Level Balance Activities Negotiating over obstacles    High Level Balance Comments Completed negotiating over orange hurdles 2 x 40', then progressed to addition of dual tasking with negotiating over hurdles including counting backwards by 1's, 2's from various number. increased time required with addition of dual task. CGA required.       Self-Care   Self-Care Other Self-Care Comments    Other Self-Care Comments  Patient asking if he should be evaluated by ENT. PT educating due to hearing loss that it may be beneficial. Patient also feelign frustrated with balance exercises, PT educating on trying to complete balance exercises prior to strength due to impact of fatigue.                 Balance Exercises - 01/07/20 1021      Balance Exercises: Standing   Standing Eyes Closed  Wide (BOA);Foam/compliant surface    Standing Eyes Closed Time 25-30 secs, intermittent UE support/CGA from PT    Standing Eyes Closed Limitations continue to demo increased sway with EC. patient feeling frustrated with balance with EC.     Tandem Stance Eyes open;Intermittent upper extremity support;3 reps;30 secs;Limitations    Tandem Stance Time completed standing full tandem 2 x 30 seconds each with EO. progressed to completing horizontal/vertical head turns 1 x 15 reps each. increased balance challenge with addition of head turns requiring intermittent UE suppot. CGA from PT    SLS with Vectors Solid surface;Intermittent upper extremity assist;Limitations    SLS with Vectors Limitations completed  ambulation with toe tap to cone to further promote SLS. completed initially with single UE support and patient able to complete reciprocally x 3 laps at countertop. Patient progressed to completing without UE support x 3 laps, patient using step to pattern as unable to complete reciprocal stepping. increased balance challenge without UE support requiring CGA from PT. verbal cues to avoid circumduction with completion.    Tandem Gait Forward;Upper extremity support;3 reps;Limitations    Tandem Gait Limitations x 3 laps down and back in // bars. intermittent UE support required on single side for improved completion    Other Standing Exercises completed backwards walking 4 x 30', increased verbal cues for improved step length to promote improved completion. one instance of LOB requiring CGA from PT due to staggering.                PT Short Term Goals - 12/24/19 3220      PT SHORT TERM GOAL #1   Title Patient will be independent with initial balance HEP (ALL STG's Due: 12/24/2019)    Baseline Reports independence, completing daily.    Time 3    Period Weeks    Status Achieved    Target Date 12/24/19   due to delay in scheduling     PT [place] #2   Title Patient will completed 5x sit  <> stand in </= 10 seconds without UE support to demo improved functional strength    Baseline 12.59 secs, 9.59 secs w/o UE support    Time 3    Period Weeks    Status Achieved      PT SHORT TERM GOAL #3   Title Patient will improve FGA to >/= 19/30 to demonstrate reduced fall risk    Baseline 17/30, 20/30    Time 3    Period Weeks    Status Achieved             PT Long Term Goals - 11/26/19 1131      PT LONG TERM GOAL #1   Title Patient will be independent with final HEP for balance/strengthening (ALL LTGs Due: 01/14/20)    Baseline no HEP established    Time 6    Period Weeks    Status New    Target Date 01/14/20      PT LONG TERM GOAL #2   Title Patient will improve FGA to >/= 22/30 to demonstrate improved balance and reduced risk for falls    Baseline 17/30    Time 6    Period Weeks    Status New      PT LONG TERM GOAL #3   Title Patient will complete TUG in </= 10 seconds to demonstrate reduced risk for falls    Baseline 11.41 secs    Time 6    Period Weeks    Status New      PT LONG TERM GOAL #4   Title Patient will demo ability to ambulate >500 ft on outdoor unlevel surfaces, Mod I to demonstrate improved community mobility    Baseline TBD    Time 6    Period Weeks    Status New                 Plan - 01/07/20 1203    Clinical Impression Statement Today's skilled PT session included continued balance training with focus on vision removed. Patient continues to demonstrate decreased balance with vision removed. Continued progression of balance exercises with completion of negotiating over  obstacles and activites promoting SLS. Will continue to progress toward all goals.    Personal Factors and Comorbidities Comorbidity 3+    Comorbidities ADHD. B12 Deficiency, BPH, Chronic Lymphocystic Leukemia, Crohn's Disease, CAD, Benign Tremor, Hyperlipidemia, Osteoperosis    Examination-Activity Limitations Stairs;Stand;Locomotion Level     Examination-Participation Restrictions Community Activity;Yard Work;Occupation    Stability/Clinical Decision Making Stable/Uncomplicated    Rehab Potential Good    PT Frequency 2x / week    PT Duration 6 weeks    PT Treatment/Interventions ADLs/Self Care Home Management;Cryotherapy;Moist Heat;DME Instruction;Stair training;Functional mobility training;Therapeutic activities;Gait training;Therapeutic exercise;Balance training;Neuromuscular re-education;Patient/family education;Orthotic Fit/Training;Manual techniques;Passive range of motion;Vestibular    PT Next Visit Plan Update about ENT? Continue balance exercises targeting vestibular system. (rockerboard/balance beam) and incorporate postural exercises into balance. Walking with head turns. Dual Tasking    Consulted and Agree with Plan of Care Patient           Patient will benefit from skilled therapeutic intervention in order to improve the following deficits and impairments:  Abnormal gait, Decreased balance, Difficulty walking, Postural dysfunction, Pain, Decreased strength, Decreased activity tolerance, Improper body mechanics  Visit Diagnosis: History of falling  Abnormal posture  Other abnormalities of gait and mobility  Unsteadiness on feet  Muscle weakness (generalized)     Problem List Patient Active Problem List   Diagnosis Date Noted  . Angina pectoris ([place]) 02/13/2019  . Pseudophakia of both eyes 08/01/2018  . History of total knee arthroplasty 03/29/2017  . Stiffness of right knee 03/16/2017  . OA (osteoarthritis) of knee 03/12/2017  . Tremor, essential 12/03/2015  . Essential hypertension 12/09/2014  . Coronary artery disease involving native heart 12/08/2013  . 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[place]) 12/08/2013  . Hyperlipidemia 12/08/2013  . Benign prostatic hypertrophy 08/08/2013  . Essential and other specified forms of tremor 11/25/2012    Jones Bales, PT, DPT 01/07/2020, 12:08 PM  [facility] [address] [place], [place], [address] Phone: [phone]   Fax:  [phone]  Name: Cole Martinez MRN: [id number] Date of Birth: [birth year]

## 2020-01-07 NOTE — Telephone Encounter (Signed)
Ok to fill 

## 2020-01-11 ENCOUNTER — Encounter: Payer: Self-pay | Admitting: Dermatology

## 2020-01-11 NOTE — Progress Notes (Signed)
   Follow-Up Visit   Subjective  Cole Martinez is a 74 y.o. male who presents for the following: Skin Problem (right cheek- check spot).  Crusts Location: Cheek and scalp Duration:  Quality:  Associated Signs/Symptoms: Modifying Factors:  Severity:  Timing: Context:   Objective  Well appearing patient in no apparent distress; mood and affect are within normal limits.  A focused examination was performed including Head and neck.. Relevant physical exam findings are noted in the Assessment and Plan.   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  Skin / nail biopsy Type of biopsy: tangential   Informed consent: discussed and consent obtained   Timeout: patient name, date of birth, surgical site, and procedure verified   Anesthesia: the lesion was anesthetized in a standard fashion   Anesthetic:  1% lidocaine w/ epinephrine 1-100,000 local infiltration Instrument used: flexible razor blade   Hemostasis achieved with: ferric subsulfate   Outcome: patient tolerated procedure well   Post-procedure details: sterile dressing applied and wound care instructions given   Dressing type: bandage and petrolatum    Specimen 1 - Surgical pathology Differential Diagnosis: ak isk Check Margins: No  AK (actinic keratosis) Mid Frontal Scalp  Destruction of lesion - Mid Frontal Scalp Complexity: simple   Destruction method: cryotherapy   Informed consent: discussed and consent obtained   Lesion destroyed using liquid nitrogen: Yes   Cryotherapy cycles:  5 Outcome: patient tolerated procedure well with no complications       I, Lavonna Monarch, MD, have reviewed all documentation for this visit.  The documentation on 01/11/20 for the exam, diagnosis, procedures, and orders are all accurate and complete.

## 2020-01-14 ENCOUNTER — Ambulatory Visit: Payer: Medicare Other

## 2020-01-14 ENCOUNTER — Other Ambulatory Visit: Payer: Self-pay

## 2020-01-14 DIAGNOSIS — Z9181 History of falling: Secondary | ICD-10-CM

## 2020-01-14 DIAGNOSIS — R2681 Unsteadiness on feet: Secondary | ICD-10-CM | POA: Diagnosis not present

## 2020-01-14 DIAGNOSIS — R293 Abnormal posture: Secondary | ICD-10-CM | POA: Diagnosis not present

## 2020-01-14 DIAGNOSIS — M6281 Muscle weakness (generalized): Secondary | ICD-10-CM

## 2020-01-14 DIAGNOSIS — R2689 Other abnormalities of gait and mobility: Secondary | ICD-10-CM | POA: Diagnosis not present

## 2020-01-14 NOTE — Therapy (Signed)
[facility] [address] [place], [place], [address] Phone: [phone]   Fax:  [phone]  Physical Therapy Treatment  Patient Details  Name: Cole Martinez MRN: [id number] Date of Birth: April 15, 1945 Referring Provider (PT): Referred by: Delrae Rend, MD. PCP is Josetta Huddle, MD   Encounter Date: 01/14/2020   PT End of Session - 01/14/20 1020    Visit Number 12    Number of Visits 13    Date for PT Re-Evaluation 01/25/20   POC for 6 weeks, Cert for 60 days   Authorization Type Medicare (10th visit PN)    Progress Note Due on Visit 10    PT Start Time 1017    PT Stop Time 1059    PT Time Calculation (min) 42 min    Equipment Utilized During Treatment Gait belt    Activity Tolerance Patient tolerated treatment well    Behavior During Therapy WFL for tasks assessed/performed           Past Medical History:  Diagnosis Date  . ADHD (attention deficit hyperactivity disorder)   . B12 deficiency   . BPH (benign prostatic hypertrophy)   . 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   PT IS ASYMPTOMATIC--- LAST CBC W/ DIFF 06-24-2012 STABLE  . Coronary artery disease CARDIOLOGIST- DR Daneen Schick   S/P STENTING LAD 1999 // Myoview 01/2019: EF 62, normal perfusion; Low Risk  . Crohn's disease of ileum ([place]) SINCE 1988  . ED (erectile dysfunction)   . Elevated PSA   . Essential and other specified forms of tremor 11/25/2012  . H/O adenomatous polyp of colon   . Hyperlipidemia   . Nocturia   . OA (osteoarthritis)   . Peripheral neuropathy    hx of, none current as of 08-04-13  . Peyronie disease   . S/P coronary artery stent placement OCT 1999 OF LAD  . Tremor, hereditary, benign MILD RIGHT HAND    Past Surgical History:  Procedure Laterality Date  . CARDIOVASCULAR STRESS TEST  11-01-2010 DR Daneen Schick   NORMAL PERFUSION STUDY/ EF 64%/ NO ISCHEMIA  . cataract surgery   Bilateral feburary 2020   with lens placement   . COLONOSCOPY WITH PROPOFOL N/A 09/24/2012   Procedure: COLONOSCOPY WITH PROPOFOL;  Surgeon: [name], MD;  Location: [facility];  Service: Endoscopy;  Laterality: N/A;  . CORONARY ANGIOPLASTY WITH STENT PLACEMENT  OCT 1999   STENT OF LAD  . CORONARY STENT INTERVENTION N/A 02/14/2019   Procedure: CORONARY STENT INTERVENTION;  Surgeon: Belva Crome, MD;  Location: [facility];  Service: Cardiovascular;  Laterality: N/A;  . CYSTOSCOPY WITH URETHRAL DILATATION  03/12/2017   Procedure: CYSTOSCOPY WITH URETHRAL DILATATION;  Surgeon: Gaynelle Arabian, MD;  Location: [facility];  Service: Orthopedics;;  Ammie Dalton, Resident Assisting  . CYSTOSCOPY WITH URETHRAL DILATATION N/A 10/03/2018   Procedure: CYSTOSCOPY WITH URETHRAL BALLOON DILATATION WITH BILATERAL RETROGRADE PYELOGRAPHY;  Surgeon: Raynelle Bring, MD;  Location: [facility];  Service: Urology;  Laterality: N/A;  . LAPAROSCOPIC INGUINAL HERNIA REPAIR Bilateral 01-10-2004   W/ MESH  . LEFT HEART CATH AND CORONARY ANGIOGRAPHY N/A 02/13/2019   Procedure: LEFT HEART CATH AND CORONARY ANGIOGRAPHY;  Surgeon: Belva Crome, MD;  Location: [facility];  Service: Cardiovascular;  Laterality: N/A;  . neck benign removed from neck  yrs ago  . PROSTATE BIOPSY N/A 07/12/2012   Procedure: PROSTATE BIOPSY AND ULTRASOUND;  Surgeon:  Ailene Rud, MD;  Location[facility];  Service: Urology;  Laterality: N/A;  . PROSTATE SURGERY  2002   tuna  . REMOVAL LEFT NECK LYMPH NODE  1996  . TONSILLECTOMY  CHILD  . TOTAL KNEE ARTHROPLASTY Right 03/12/2017   Procedure: RIGHT TOTAL KNEE ARTHROPLASTY;  Surgeon: Gaynelle Arabian, MD;  Location: [facility];  Service: Orthopedics;  Laterality: Right;  Adductor Block  . TRANSURETHRAL RESECTION OF PROSTATE N/A 08/08/2013   Procedure: TRANSURETHRAL RESECTION OF THE PROSTATE WITH GYRUS INSTRUMENTS;  Surgeon: Ailene Rud, MD;  Location: [facility];   Service: Urology;  Laterality: N/A;  . TRANSURETHRAL RESECTION OF PROSTATE      There were no vitals filed for this visit.   Subjective Assessment - 01/14/20 1022    Subjective Reports trip to [place] went well. Phone died while he was there and got new one, is having dififculty with MedBridge app.    Pertinent History ADHD. B12 Deficiency, BPH, Chronic Lymphocystic Leukemia, Crohn's Disease, CAD, Benign Tremor, Hyperlipidemia, Osteoporosis    Limitations Walking    Patient Stated Goals Be able to feel secure    Currently in Pain? Yes    Pain Score 3     Pain Location Back    Pain Orientation Lower    Pain Descriptors / Indicators Aching    Pain Type Chronic pain    Pain Onset More than a month ago                             [facility] Adult PT Treatment/Exercise - 01/14/20 0001      Ambulation/Gait   Ambulation/Gait Yes    Ambulation/Gait Assistance 5: Supervision    Ambulation/Gait Assistance Details throughout therapy gym with session    Assistive device None    Gait Pattern Step-through pattern;Narrow base of support    Ambulation Surface Level;Indoor      Exercises   Exercises Other Exercises    Other Exercises  Completed resisted scapular retraction with red theraband 1 x 15 reps, with verbal cues for improved scapular retraction. Standing without back against door frame, compelted resisted horizontal abduction with red theraband x 15 reps, PT providing tactile cues at scapular for improved technique.verbal cues also required to keep arms straight. Pt reporting difficulty with medbridge app, PT able to resolve issues for patient.                Balance Exercises - 01/14/20 1102      Balance Exercises: Standing   Standing Eyes Opened Narrow base of support (BOS);Head turns;Foam/compliant surface;Limitations    Standing Eyes Opened Limitations standing with feet together    Standing Eyes Closed Wide (BOA);Foam/compliant surface;Head turns;4 reps;30  secs;Limitations    Standing Eyes Closed Time completed 4 x 30 seconds with EC. then progressed with Wide BOS and completed horizontal/vertical head turns 1 x 10 reps each with vision removed.     Standing Eyes Closed Limitations continue to require CGA due to increased sway with EC    Tandem Stance Eyes open;Intermittent upper extremity support;2 reps;30 secs;Limitations    Tandem Stance Time completed full tanem with EO, progressed to partial tandem with EC, 2 x 30 seconds each alteranting BLE    SLS with Vectors Foam/compliant surface;Intermittent upper extremity assist;Limitations    SLS with Vectors Limitations standing on airex pad, completed alteranting toe taps x 10 reps with single UE support progressed to no UE support x  10. Then progressed to completing crossover toe tap x 10 reps. UE support required with crossover due to decreased balance.     Tandem Gait Forward;Retro;Intermittent upper extremity support;3 reps;Limitations    Tandem Gait Limitations completed x 3 laps forward. progressed to completing retro gait x 3 laps with UE support from countertop. increased balance challenge with retro tandem     Other Standing Exercises completed standing with single leg of soccerball, completed fwd/back roll x 10 reps on BLE. UE support required from [place] - 12/24/19 0721      PT SHORT TERM GOAL #1   Title Patient will be independent with initial balance HEP (ALL STG's Due: 12/24/2019)    Baseline Reports independence, completing daily.    Time 3    Period Weeks    Status Achieved    Target Date 12/24/19   due to delay in scheduling     PT [place] #2   Title Patient will completed 5x sit <> stand in </= 10 seconds without UE support to demo improved functional strength    Baseline 12.59 secs, 9.59 secs w/o UE support    Time 3    Period Weeks    Status Achieved      PT SHORT TERM GOAL #3   Title Patient will improve FGA to >/=  19/30 to demonstrate reduced fall risk    Baseline 17/30, 20/30    Time 3    Period Weeks    Status Achieved             PT Long Term Goals - 11/26/19 1131      PT LONG TERM GOAL #1   Title Patient will be independent with final HEP for balance/strengthening (ALL LTGs Due: 01/14/20)    Baseline no HEP established    Time 6    Period Weeks    Status New    Target Date 01/14/20      PT LONG TERM GOAL #2   Title Patient will improve FGA to >/= 22/30 to demonstrate improved balance and reduced risk for falls    Baseline 17/30    Time 6    Period Weeks    Status New      PT LONG TERM GOAL #3   Title Patient will complete TUG in </= 10 seconds to demonstrate reduced risk for falls    Baseline 11.41 secs    Time 6    Period Weeks    Status New      PT LONG TERM GOAL #4   Title Patient will demo ability to ambulate >500 ft on outdoor unlevel surfaces, Mod I to demonstrate improved community mobility    Baseline TBD    Time 6    Period Weeks    Status New                 Plan - 01/14/20 1220    Clinical Impression Statement PT resolved issues with Medbridge App for patient. Continued therex focused on posture exercises and improved scapular retraction. Continue balance training on complaint surfaces with vision removed. Patient did demonstrate improvements today during session. Will continue per POC.    Personal Factors and Comorbidities Comorbidity 3+    Comorbidities ADHD. B12 Deficiency, BPH, Chronic Lymphocystic Leukemia, Crohn's Disease, CAD, Benign Tremor, Hyperlipidemia, Osteoperosis    Examination-Activity Limitations Stairs;Stand;Locomotion Level    Examination-Participation Restrictions Community  Activity;Yard Work;Occupation    Stability/Clinical Decision Making Stable/Uncomplicated    Rehab Potential Good    PT Frequency 2x / week    PT Duration 6 weeks    PT Treatment/Interventions ADLs/Self Care Home Management;Cryotherapy;Moist Heat;DME  Instruction;Stair training;Functional mobility training;Therapeutic activities;Gait training;Therapeutic exercise;Balance training;Neuromuscular re-education;Patient/family education;Orthotic Fit/Training;Manual techniques;Passive range of motion;Vestibular    PT Next Visit Plan Check goals + re-cert/discharge. Continue balance exercises targeting vestibular system. (rockerboard/balance beam) and incorporate postural exercises into balance. Walking with head turns. Dual Tasking    Consulted and Agree with Plan of Care Patient           Patient will benefit from skilled therapeutic intervention in order to improve the following deficits and impairments:  Abnormal gait, Decreased balance, Difficulty walking, Postural dysfunction, Pain, Decreased strength, Decreased activity tolerance, Improper body mechanics  Visit Diagnosis: History of falling  Abnormal posture  Other abnormalities of gait and mobility  Unsteadiness on feet  Muscle weakness (generalized)     Problem List Patient Active Problem List   Diagnosis Date Noted  . Angina pectoris ([place]) 02/13/2019  . Pseudophakia of both eyes 08/01/2018  . History of total knee arthroplasty 03/29/2017  . Stiffness of right knee 03/16/2017  . OA (osteoarthritis) of knee 03/12/2017  . Tremor, essential 12/03/2015  . Essential hypertension 12/09/2014  . Coronary artery disease involving native heart 12/08/2013  . 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[place]) 12/08/2013  . Hyperlipidemia 12/08/2013  . Benign prostatic hypertrophy 08/08/2013  . Essential and other specified forms of tremor 11/25/2012    Jones Bales, PT, DPT 01/14/2020, 12:22 PM  [facility] [address] [place], [place], [address] Phone: [phone]   Fax:  [phone]  Name: Cole Martinez MRN: [id number] Date of Birth: 08-17-1945

## 2020-01-16 ENCOUNTER — Other Ambulatory Visit: Payer: Self-pay

## 2020-01-16 ENCOUNTER — Ambulatory Visit: Payer: Medicare Other

## 2020-01-16 ENCOUNTER — Other Ambulatory Visit: Payer: Self-pay | Admitting: Interventional Cardiology

## 2020-01-16 DIAGNOSIS — R293 Abnormal posture: Secondary | ICD-10-CM

## 2020-01-16 DIAGNOSIS — R2681 Unsteadiness on feet: Secondary | ICD-10-CM | POA: Diagnosis not present

## 2020-01-16 DIAGNOSIS — Z9181 History of falling: Secondary | ICD-10-CM

## 2020-01-16 DIAGNOSIS — M6281 Muscle weakness (generalized): Secondary | ICD-10-CM | POA: Diagnosis not present

## 2020-01-16 DIAGNOSIS — R2689 Other abnormalities of gait and mobility: Secondary | ICD-10-CM

## 2020-01-16 NOTE — Therapy (Signed)
[facility] [address] [place], [place], [address] Phone: [phone]   Fax:  [phone]  Physical Therapy Treatment  Patient Details  Name: Cole Martinez MRN: [id number] Date of Birth: [birth year] Referring Provider (PT): Referred by: Delrae Rend, MD. PCP is Josetta Huddle, MD   Encounter Date: 01/16/2020   PT End of Session - 01/16/20 1019    Visit Number 13    Number of Visits 21    Date for PT Re-Evaluation 03/16/20   POC for 4 weeks, Cert for 60 days   Authorization Type Medicare (20th visit PN)    Progress Note Due on Visit 20    PT Start Time 1017    PT Stop Time 1059    PT Time Calculation (min) 42 min    Equipment Utilized During Treatment Gait belt    Activity Tolerance Patient tolerated treatment well    Behavior During Therapy WFL for tasks assessed/performed           Past Medical History:  Diagnosis Date  . ADHD (attention deficit hyperactivity disorder)   . B12 deficiency   . BPH (benign prostatic hypertrophy)   . 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 Coronary artery disease CARDIOLOGIST- DR Daneen Schick   S/P STENTING LAD 1999 // Myoview 01/2019: EF 62, normal perfusion; Low Risk  . Crohn's disease of ileum ([place]) SINCE 1988  . ED (erectile dysfunction)   . Elevated PSA   . Essential and other specified forms of tremor 11/25/2012  . H/O adenomatous polyp of colon   . Hyperlipidemia   . Nocturia   . OA (osteoarthritis)   . Peripheral neuropathy    hx of, none current as of 08-04-13  . Peyronie disease   . S/P coronary artery stent placement OCT 1999 OF LAD  . Tremor, hereditary, benign MILD RIGHT HAND    Past Surgical History:  Procedure Laterality Date  . CARDIOVASCULAR STRESS TEST  11-01-2010 DR Daneen Schick   NORMAL PERFUSION STUDY/ EF 64%/ NO ISCHEMIA  . cataract surgery   Bilateral feburary 2020   with lens placement   . COLONOSCOPY WITH PROPOFOL N/A 09/24/2012   Procedure: COLONOSCOPY WITH PROPOFOL;  Surgeon: [name], MD;  [facility];  Service: Endoscopy;  Laterality: N/A;  . CORONARY ANGIOPLASTY WITH STENT PLACEMENT  OCT 1999   STENT OF LAD  . CORONARY STENT INTERVENTION N/A 02/14/2019   Procedure: CORONARY STENT INTERVENTION;  Surgeon: Belva Crome, MD;  Location: [facility];  Service: Cardiovascular;  Laterality: N/A;  . CYSTOSCOPY WITH URETHRAL DILATATION  03/12/2017   Procedure: CYSTOSCOPY WITH URETHRAL DILATATION;  Surgeon: Gaynelle Arabian, MD;  Location: [facility];  Service: Orthopedics;;  Ammie Dalton, Resident Assisting  . CYSTOSCOPY WITH URETHRAL DILATATION N/A 10/03/2018   Procedure: CYSTOSCOPY WITH URETHRAL BALLOON DILATATION WITH BILATERAL RETROGRADE PYELOGRAPHY;  Surgeon: Raynelle Bring, MD;  [facility];  Service: Urology;  Laterality: N/A;  . LAPAROSCOPIC INGUINAL HERNIA REPAIR Bilateral 01-10-2004   W/ MESH  . LEFT HEART CATH AND CORONARY ANGIOGRAPHY N/A 02/13/2019   Procedure: LEFT HEART CATH AND CORONARY ANGIOGRAPHY;  Surgeon: Belva Crome, MD;  Location: [facility];  Service: Cardiovascular;  Laterality: N/A;  . neck benign removed from neck  yrs ago  . PROSTATE BIOPSY N/A 07/12/2012   Procedure: PROSTATE BIOPSY AND ULTRASOUND;  Surgeon:  Ailene Rud, MD;  Location: [facility];  Service: Urology;  Laterality: N/A;  . PROSTATE SURGERY  2002   tuna  . REMOVAL LEFT NECK LYMPH NODE  1996  . TONSILLECTOMY  CHILD  . TOTAL KNEE ARTHROPLASTY Right 03/12/2017   Procedure: RIGHT TOTAL KNEE ARTHROPLASTY;  Surgeon: Gaynelle Arabian, MD;  [facility];  Service: Orthopedics;  Laterality: Right;  Adductor Block  . TRANSURETHRAL RESECTION OF PROSTATE N/A 08/08/2013   Procedure: TRANSURETHRAL RESECTION OF THE PROSTATE WITH GYRUS INSTRUMENTS;  Surgeon: Ailene Rud, MD;  [facility];   Service: Urology;  Laterality: N/A;  . TRANSURETHRAL RESECTION OF PROSTATE      There were no vitals filed for this visit.   Subjective Assessment - 01/16/20 1019    Subjective Patient reports no new changes/complaint since last visit. No falls.    Pertinent History ADHD. B12 Deficiency, BPH, Chronic Lymphocystic Leukemia, Crohn's Disease, CAD, Benign Tremor, Hyperlipidemia, Osteoporosis    Limitations Walking    Patient Stated Goals Be able to feel secure    Currently in Pain? Yes    Pain Score 3     Pain Location Back    Pain Orientation Lower    Pain Descriptors / Indicators Aching    Pain Type Chronic pain    Pain Onset More than a month ago              [facility] PT Assessment - 01/16/20 1034      Functional Gait  Assessment   Gait assessed  Yes    Gait Level Surface Walks 20 ft in less than 7 sec but greater than 5.5 sec, uses assistive device, slower speed, mild gait deviations, or deviates 6-10 in outside of the 12 in walkway width.    Change in Gait Speed Able to smoothly change walking speed without loss of balance or gait deviation. Deviate no more than 6 in outside of the 12 in walkway width.    Gait with Horizontal Head Turns Performs head turns smoothly with slight change in gait velocity (eg, minor disruption to smooth gait path), deviates 6-10 in outside 12 in walkway width, or uses an assistive device.    Gait with Vertical Head Turns Performs task with slight change in gait velocity (eg, minor disruption to smooth gait path), deviates 6 - 10 in outside 12 in walkway width or uses assistive device    Gait and Pivot Turn Pivot turns safely within 3 sec and stops quickly with no loss of balance.    Step Over Obstacle Is able to step over 2 stacked shoe boxes taped together (9 in total height) without changing gait speed. No evidence of imbalance.    Gait with Narrow Base of Support Ambulates 7-9 steps.    Gait with Eyes Closed Walks 20 ft, slow speed, abnormal gait  pattern, evidence for imbalance, deviates 10-15 in outside 12 in walkway width. Requires more than 9 sec to ambulate 20 ft.    Ambulating Backwards Walks 20 ft, uses assistive device, slower speed, mild gait deviations, deviates 6-10 in outside 12 in walkway width.    Steps Alternating feet, must use rail.    Total Score 22    FGA comment: 22/30 = Medium Fall Risk              [facility] Adult PT Treatment/Exercise - 01/16/20 0001      Ambulation/Gait   Ambulation/Gait Yes    Ambulation/Gait Assistance 6: Modified independent (Device/Increase time)  Ambulation/Gait Assistance Details ambulation on outdoor unlevel surfaces inlcuding pavement, gravel, and grass x 600 ft. no instances of imbalance noted during ambulation    Ambulation Distance (Feet) 600 Feet    Assistive device None    Gait Pattern Step-through pattern;Narrow base of support    Ambulation Surface Unlevel;Outdoor;Paved;Gravel;Grass    Curb 6: Modified independent (Device/increase time)    Gait Comments completed ascend/descend curb outdoors Mod I, no instances of imbalance.       Standardized Balance Assessment   Standardized Balance Assessment Timed Up and Go Test      Timed Up and Go Test   TUG Normal TUG    Normal TUG (seconds) 7.91      Neuro Re-ed    Neuro Re-ed Details  Completed M-CTSIB, patient able to complete situation 1-3 for full 30 seconds, situation 4 for on average 5-6 seconds. increased sway noted with EC and narrow BOS.                PT Education - 01/16/20 1100    Education Details Educated on progress toward LTG; Update POC    Person(s) Educated Patient    Methods Explanation    Comprehension Verbalized understanding            PT Short Term Goals - 12/24/19 0721      PT SHORT TERM GOAL #1   Title Patient will be independent with initial balance HEP (ALL STG's Due: 12/24/2019)    Baseline Reports independence, completing daily.    Time 3    Period Weeks    Status Achieved     Target Date 12/24/19   due to delay in scheduling     PT [place] #2   Title Patient will completed 5x sit <> stand in </= 10 seconds without UE support to demo improved functional strength    Baseline 12.59 secs, 9.59 secs w/o UE support    Time 3    Period Weeks    Status Achieved      PT SHORT TERM GOAL #3   Title Patient will improve FGA to >/= 19/30 to demonstrate reduced fall risk    Baseline 17/30, 20/30    Time 3    Period Weeks    Status Achieved             PT Long Term Goals - 01/16/20 1021      PT LONG TERM GOAL #1   Title Patient will be independent with final HEP for balance/strengthening (ALL LTGs Due: 01/14/20)    Baseline compliance and independence with current HEP    Time 6    Period Weeks    Status Achieved      PT LONG TERM GOAL #2   Title Patient will improve FGA to >/= 22/30 to demonstrate improved balance and reduced risk for falls    Baseline 17/30, 22/30    Time 6    Period Weeks    Status Achieved      PT LONG TERM GOAL #3   Title Patient will complete TUG in </= 10 seconds to demonstrate reduced risk for falls    Baseline 11.41 secs, 7.91 secs    Time 6    Period Weeks    Status Achieved      PT LONG TERM GOAL #4   Title Patient will demo ability to ambulate >500 ft on outdoor unlevel surfaces, Mod I to demonstrate improved community mobility    Baseline 600 ft on  unlevel outdoor surfaces, Mod I    Time 6    Period Weeks    Status Achieved           Updated Short Term Goals:   PT Short Term Goals - 01/16/20 1256      PT SHORT TERM GOAL #1   Title = LTG           Updated Long Term Goals:   PT Long Term Goals - 01/16/20 1256      PT LONG TERM GOAL #1   Title Patient will be independent with final HEP for balance/strengthening/posture (ALL LTGs Due: 02/13/20)    Baseline continue to progress HEP    Time 4    Period Weeks    Status Revised    Target Date 02/13/20      PT LONG TERM GOAL #2   Title Patient will  improve FGA to >/= 25/30 to demonstrate improved balance and reduced risk for falls    Baseline 17/30, 22/30    Time 4    Period Weeks    Status Revised      PT LONG TERM GOAL #3   Title Patient will be able to hold situation 4 of M-CTSIB for >/= 15 seconds to demo improved vestibular input for balance.    Baseline 5-6 secs    Time 4    Period Weeks    Status New      PT LONG TERM GOAL #4   Title Patient will demonstrate improvement of vestibular input >/= 25% on Sensory Organization Test to improve balance    Baseline 15%    Time 4    Period Weeks    Status New                Plan - 01/16/20 1110    Clinical Impression Statement Today's skilled PT session included assessment of patient's progress toward LTG's. Paitent able to meet all LTGs today during session demonstrating improvements in balance, functional mobility on outdoor surfaces. Patient scored 22/30 on FGA today. Patient will continue to benefit from skilled PT services to continue to work on improvements regarding balance, posture, and further reduce fall risk.    Personal Factors and Comorbidities Comorbidity 3+    Comorbidities ADHD. B12 Deficiency, BPH, Chronic Lymphocystic Leukemia, Crohn's Disease, CAD, Benign Tremor, Hyperlipidemia, Osteoperosis    Examination-Activity Limitations Stairs;Stand;Locomotion Level    Examination-Participation Restrictions Community Activity;Yard Work;Occupation    Stability/Clinical Decision Making Stable/Uncomplicated    Rehab Potential Good    PT Frequency 2x / week    PT Duration 4 weeks    PT Treatment/Interventions ADLs/Self Care Home Management;Cryotherapy;Moist Heat;DME Instruction;Stair training;Functional mobility training;Therapeutic activities;Gait training;Therapeutic exercise;Balance training;Neuromuscular re-education;Patient/family education;Orthotic Fit/Training;Manual techniques;Passive range of motion;Vestibular    PT Next Visit Plan Continue balance exercises  targeting vestibular system. (rockerboard/balance beam) and incorporate postural exercises into balance. Walking with head turns. Dual Tasking    Consulted and Agree with Plan of Care Patient           Patient will benefit from skilled therapeutic intervention in order to improve the following deficits and impairments:  Abnormal gait, Decreased balance, Difficulty walking, Postural dysfunction, Pain, Decreased strength, Decreased activity tolerance, Improper body mechanics  Visit Diagnosis: Abnormal posture  Other abnormalities of gait and mobility  Unsteadiness on feet  Muscle weakness (generalized)  History of falling     Problem List Patient Active Problem List   Diagnosis Date Noted  . Angina pectoris ([place]) 02/13/2019  . Pseudophakia  of both eyes 08/01/2018  . History of total knee arthroplasty 03/29/2017  . Stiffness of right knee 03/16/2017  . OA (osteoarthritis) of knee 03/12/2017  . Tremor, essential 12/03/2015  . Essential hypertension 12/09/2014  . Coronary artery disease involving native heart 12/08/2013  . 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[place]) 12/08/2013  . Hyperlipidemia 12/08/2013  . Benign prostatic hypertrophy 08/08/2013  . Essential and other specified forms of tremor 11/25/2012    Jones Bales, PT, DPT 01/16/2020, 12:55 PM  [facility] [address] [place], [place], [address] Phone: [phone]   Fax:  [phone]  Name: Cole Martinez MRN: [id number] Date of Birth: August 14, 1945

## 2020-01-21 ENCOUNTER — Ambulatory Visit: Payer: Medicare Other

## 2020-01-22 ENCOUNTER — Ambulatory Visit: Payer: Medicare Other

## 2020-01-22 ENCOUNTER — Other Ambulatory Visit: Payer: Self-pay

## 2020-01-22 DIAGNOSIS — M6281 Muscle weakness (generalized): Secondary | ICD-10-CM | POA: Diagnosis not present

## 2020-01-22 DIAGNOSIS — R2689 Other abnormalities of gait and mobility: Secondary | ICD-10-CM | POA: Diagnosis not present

## 2020-01-22 DIAGNOSIS — R2681 Unsteadiness on feet: Secondary | ICD-10-CM | POA: Diagnosis not present

## 2020-01-22 DIAGNOSIS — Z9181 History of falling: Secondary | ICD-10-CM | POA: Diagnosis not present

## 2020-01-22 DIAGNOSIS — R293 Abnormal posture: Secondary | ICD-10-CM

## 2020-01-22 NOTE — Therapy (Signed)
[facility] [address] [place], [place], [address] Phone: [phone]   Fax:  [phone]  Physical Therapy Treatment  Patient Details  Name: Cole Martinez MRN: [id number] Date of Birth: Jun 04, 1945 Referring Provider (PT): Referred by: Delrae Rend, MD. PCP is Josetta Huddle, MD   Encounter Date: 01/22/2020   PT End of Session - 01/22/20 1536    Visit Number 14    Number of Visits 21    Date for PT Re-Evaluation 03/16/20   POC for 4 weeks, Cert for 60 days   Authorization Type Medicare (20th visit PN)    Progress Note Due on Visit 20    PT Start Time 1532    PT Stop Time 1615    PT Time Calculation (min) 43 min    Equipment Utilized During Treatment Gait belt    Activity Tolerance Patient tolerated treatment well    Behavior During Therapy [facility] for tasks assessed/performed           Past Medical History:  Diagnosis Date  . ADHD (attention deficit hyperactivity disorder)   . B12 deficiency   . BPH (benign prostatic hypertrophy)   . 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 Coronary artery disease CARDIOLOGIST- DR Daneen Schick   S/P STENTING LAD 1999 // Myoview 01/2019: EF 62, normal perfusion; Low Risk  . Crohn's disease of ileum ([place]) SINCE 1988  . ED (erectile dysfunction)   . Elevated PSA   . Essential and other specified forms of tremor 11/25/2012  . H/O adenomatous polyp of colon   . Hyperlipidemia   . Nocturia   . OA (osteoarthritis)   . Peripheral neuropathy    hx of, none current as of 08-04-13  . Peyronie disease   . S/P coronary artery stent placement OCT 1999 OF LAD  . Tremor, hereditary, benign MILD RIGHT HAND    Past Surgical History:  Procedure Laterality Date  . CARDIOVASCULAR STRESS TEST  11-01-2010 DR Daneen Schick   NORMAL PERFUSION STUDY/ EF 64%/ NO ISCHEMIA  . cataract surgery   Bilateral feburary 2020   with lens placement   . COLONOSCOPY WITH PROPOFOL N/A 09/24/2012   Procedure: COLONOSCOPY WITH PROPOFOL;  Surgeon: [name], MD;  [facility];  Service: Endoscopy;  Laterality: N/A;  . CORONARY ANGIOPLASTY WITH STENT PLACEMENT  OCT 1999   STENT OF LAD  . CORONARY STENT INTERVENTION N/A 02/14/2019   Procedure: CORONARY STENT INTERVENTION;  Surgeon: Belva Crome, MD;  Location: [facility];  Service: Cardiovascular;  Laterality: N/A;  . CYSTOSCOPY WITH URETHRAL DILATATION  03/12/2017   Procedure: CYSTOSCOPY WITH URETHRAL DILATATION;  Surgeon: Gaynelle Arabian, MD;  [facility];  Service: Orthopedics;;  Ammie Dalton, Resident Assisting  . CYSTOSCOPY WITH URETHRAL DILATATION N/A 10/03/2018   Procedure: CYSTOSCOPY WITH URETHRAL BALLOON DILATATION WITH BILATERAL RETROGRADE PYELOGRAPHY;  Surgeon: Raynelle Bring, MD;  [facility];  Service: Urology;  Laterality: N/A;  . LAPAROSCOPIC INGUINAL HERNIA REPAIR Bilateral 01-10-2004   W/ MESH  . LEFT HEART CATH AND CORONARY ANGIOGRAPHY N/A 02/13/2019   Procedure: LEFT HEART CATH AND CORONARY ANGIOGRAPHY;  Surgeon: Belva Crome, MD;  Location: [facility];  Service: Cardiovascular;  Laterality: N/A;  . neck benign removed from neck  yrs ago  . PROSTATE BIOPSY N/A 07/12/2012   Procedure: PROSTATE BIOPSY AND ULTRASOUND;  Surgeon:  Ailene Rud, MD;  Location: [facility];  Service: Urology;  Laterality: N/A;  . PROSTATE SURGERY  2002   tuna  . REMOVAL LEFT NECK LYMPH NODE  1996  . TONSILLECTOMY  CHILD  . TOTAL KNEE ARTHROPLASTY Right 03/12/2017   Procedure: RIGHT TOTAL KNEE ARTHROPLASTY;  Surgeon: Gaynelle Arabian, MD;  [facility];  Service: Orthopedics;  Laterality: Right;  Adductor Block  . TRANSURETHRAL RESECTION OF PROSTATE N/A 08/08/2013   Procedure: TRANSURETHRAL RESECTION OF THE PROSTATE WITH GYRUS INSTRUMENTS;  Surgeon: Ailene Rud, MD;  [facility];   Service: Urology;  Laterality: N/A;  . TRANSURETHRAL RESECTION OF PROSTATE      There were no vitals filed for this visit.   Subjective Assessment - 01/22/20 1537    Subjective Patient reports no new complaints or changes. No falls, one near fall when getting up from the chair too quickly.    Pertinent History ADHD. B12 Deficiency, BPH, Chronic Lymphocystic Leukemia, Crohn's Disease, CAD, Benign Tremor, Hyperlipidemia, Osteoporosis    Limitations Walking    Patient Stated Goals Be able to feel secure    Currently in Pain? No/denies    Pain Onset More than a month ago              [facility] Adult PT Treatment/Exercise - 01/22/20 0001      Ambulation/Gait   Ambulation/Gait Yes    Ambulation/Gait Assistance 5: Supervision    Ambulation/Gait Assistance Details with high level balance     Assistive device None    Gait Pattern Step-through pattern;Narrow base of support    Ambulation Surface Level;Indoor      High Level Balance   High Level Balance Activities Backward walking;Side stepping    High Level Balance Comments Completed backwards walking 50' x 2, progresed to addition of ball toss with backwards walking 2 x 50'. Completed side stepping with ball 2 x 50'. Increased balance challenge with ball toss. CGA required.       Self-Care   Self-Care Other Self-Care Comments    Other Self-Care Comments  Reviewed questions patient had regarding MedBridge App, PT answering all questions to promote improved compliance.       Exercises   Exercises Other Exercises    Other Exercises  Completed runner's march on bottom stair, x 10 reps with single UE support. Added to HEP as a replacement for the forward marching with walking.                Balance Exercises - 01/22/20 0001      Balance Exercises: Standing   Rockerboard Anterior/posterior;Lateral;EO;Intermittent UE support;Limitations    Rockerboard Limitations Standing on rockerboard positioned A/P completed static standing with  EO, 3 x 30 seconds. progresed to EC 2 x 30 seconds. With EO, PT Providing manual perturbations A/P x 10 reps each direction. Progressed to board positioned laterally, PT providing R/L perturbations x 10 reps. Increased challenge with activites positioned on board laterally. CGA and intermittent UE support required.      Balance Beam Standing across blue balance beam completed standing with narrow BOS and eyes closed, 3 x 30 seconds. Progressed to completing horizontal/vertical head turns 1 x 10 reps. On foam with wide BOS completed scapular retraction with 3 second hold to promote improved posture.     Other Standing Exercises standing on rockerboard positioned ant/post completed alterating toe taps to cones with single UE support x 10 reps, then progressed to no UE support. Increased CGA required.  PT Short Term Goals - 01/16/20 1256      PT SHORT TERM GOAL #1   Title = LTG             PT Long Term Goals - 01/16/20 1256      PT LONG TERM GOAL #1   Title Patient will be independent with final HEP for balance/strengthening/posture (ALL LTGs Due: 02/13/20)    Baseline continue to progress HEP    Time 4    Period Weeks    Status Revised    Target Date 02/13/20      PT LONG TERM GOAL #2   Title Patient will improve FGA to >/= 25/30 to demonstrate improved balance and reduced risk for falls    Baseline 17/30, 22/30    Time 4    Period Weeks    Status Revised      PT LONG TERM GOAL #3   Title Patient will be able to hold situation 4 of M-CTSIB for >/= 15 seconds to demo improved vestibular input for balance.    Baseline 5-6 secs    Time 4    Period Weeks    Status New      PT LONG TERM GOAL #4   Title Patient will demonstrate improvement of vestibular input >/= 25% on Sensory Organization Test to improve balance    Baseline 15%    Time 4    Period Weeks    Status New                 Plan - 01/22/20 1720    Clinical Impression Statement Today's  skilled PT session focused on continued balance activites focused on complaint surfaces with vision removed. Continued high level balance working on improved dynamic balance including ball toss. Increased balance challenge with dynamic tasks. Will continue per POC.    Personal Factors and Comorbidities Comorbidity 3+    Comorbidities ADHD. B12 Deficiency, BPH, Chronic Lymphocystic Leukemia, Crohn's Disease, CAD, Benign Tremor, Hyperlipidemia, Osteoperosis    Examination-Activity Limitations Stairs;Stand;Locomotion Level    Examination-Participation Restrictions Community Activity;Yard Work;Occupation    Stability/Clinical Decision Making Stable/Uncomplicated    Rehab Potential Good    PT Frequency 2x / week    PT Duration 4 weeks    PT Treatment/Interventions ADLs/Self Care Home Management;Cryotherapy;Moist Heat;DME Instruction;Stair training;Functional mobility training;Therapeutic activities;Gait training;Therapeutic exercise;Balance training;Neuromuscular re-education;Patient/family education;Orthotic Fit/Training;Manual techniques;Passive range of motion;Vestibular    PT Next Visit Plan Continue balance exercises targeting vestibular system. (rockerboard/balance beam) and incorporate postural exercises into balance. Walking with head turns. Dual Tasking    Consulted and Agree with Plan of Care Patient           Patient will benefit from skilled therapeutic intervention in order to improve the following deficits and impairments:  Abnormal gait, Decreased balance, Difficulty walking, Postural dysfunction, Pain, Decreased strength, Decreased activity tolerance, Improper body mechanics  Visit Diagnosis: Abnormal posture  Other abnormalities of gait and mobility  Unsteadiness on feet  Muscle weakness (generalized)  History of falling     Problem List Patient Active Problem List   Diagnosis Date Noted  . Angina pectoris ([place]) 02/13/2019  . Pseudophakia of both eyes 08/01/2018  .  History of total knee arthroplasty 03/29/2017  . Stiffness of right knee 03/16/2017  . OA (osteoarthritis) of knee 03/12/2017  . Tremor, essential 12/03/2015  . Essential hypertension 12/09/2014  . Coronary artery disease involving native heart 12/08/2013  . 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[place]) 12/08/2013  . Hyperlipidemia 12/08/2013  . Benign  prostatic hypertrophy 08/08/2013  . Essential and other specified forms of tremor 11/25/2012    Jones Bales, PT, DPT 01/22/2020, 5:23 PM  [facility] [address] [place], [place], [address] Phone: [phone]   Fax:  [phone]  Name: Cole Martinez MRN: [id number] Date of Birth: 1945/03/31

## 2020-01-23 ENCOUNTER — Ambulatory Visit: Payer: Medicare Other

## 2020-01-23 DIAGNOSIS — R2681 Unsteadiness on feet: Secondary | ICD-10-CM | POA: Diagnosis not present

## 2020-01-23 DIAGNOSIS — Z9181 History of falling: Secondary | ICD-10-CM | POA: Diagnosis not present

## 2020-01-23 DIAGNOSIS — R293 Abnormal posture: Secondary | ICD-10-CM | POA: Diagnosis not present

## 2020-01-23 DIAGNOSIS — M6281 Muscle weakness (generalized): Secondary | ICD-10-CM | POA: Diagnosis not present

## 2020-01-23 DIAGNOSIS — R2689 Other abnormalities of gait and mobility: Secondary | ICD-10-CM | POA: Diagnosis not present

## 2020-01-23 NOTE — Therapy (Signed)
[facility] [address] [place], [place], [address] Phone: [phone]   Fax:  [phone]  Physical Therapy Treatment  Patient Details  Name: Cole Martinez MRN: [id number] Date of Birth: 12-23-1945 Referring Provider (PT): Referred by: Delrae Rend, MD. PCP is Josetta Huddle, MD   Encounter Date: 01/23/2020   PT End of Session - 01/23/20 1018    Visit Number 15    Number of Visits 21    Date for PT Re-Evaluation 03/16/20   POC for 4 weeks, Cert for 60 days   Authorization Type Medicare (20th visit PN)    Progress Note Due on Visit 20    PT Start Time 1016    PT Stop Time 1059    PT Time Calculation (min) 43 min    Equipment Utilized During Treatment Gait belt    Activity Tolerance Patient tolerated treatment well    Behavior During Therapy WFL for tasks assessed/performed           Past Medical History:  Diagnosis Date  . ADHD (attention deficit hyperactivity disorder)   . B12 deficiency   . BPH (benign prostatic hypertrophy)   . 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 Coronary artery disease CARDIOLOGIST- DR Daneen Schick   S/P STENTING LAD 1999 // Myoview 01/2019: EF 62, normal perfusion; Low Risk  . Crohn's disease of ileum ([place]) SINCE 1988  . ED (erectile dysfunction)   . Elevated PSA   . Essential and other specified forms of tremor 11/25/2012  . H/O adenomatous polyp of colon   . Hyperlipidemia   . Nocturia   . OA (osteoarthritis)   . Peripheral neuropathy    hx of, none current as of 08-04-13  . Peyronie disease   . S/P coronary artery stent placement OCT 1999 OF LAD  . Tremor, hereditary, benign MILD RIGHT HAND    Past Surgical History:  Procedure Laterality Date  . CARDIOVASCULAR STRESS TEST  11-01-2010 DR Daneen Schick   NORMAL PERFUSION STUDY/ EF 64%/ NO ISCHEMIA  . cataract surgery   Bilateral feburary 2020   with lens placement   . COLONOSCOPY WITH PROPOFOL N/A 09/24/2012   Procedure: COLONOSCOPY WITH PROPOFOL;  Surgeon: [name], MD;  [facility];  Service: Endoscopy;  Laterality: N/A;  . CORONARY ANGIOPLASTY WITH STENT PLACEMENT  OCT 1999   STENT OF LAD  . CORONARY STENT INTERVENTION N/A 02/14/2019   Procedure: CORONARY STENT INTERVENTION;  Surgeon: Belva Crome, MD;  Location: [facility];  Service: Cardiovascular;  Laterality: N/A;  . CYSTOSCOPY WITH URETHRAL DILATATION  03/12/2017   Procedure: CYSTOSCOPY WITH URETHRAL DILATATION;  Surgeon: Gaynelle Arabian, MD;  [facility];  Service: Orthopedics;;  Ammie Dalton, Resident Assisting  . CYSTOSCOPY WITH URETHRAL DILATATION N/A 10/03/2018   Procedure: CYSTOSCOPY WITH URETHRAL BALLOON DILATATION WITH BILATERAL RETROGRADE PYELOGRAPHY;  Surgeon: Raynelle Bring, MD;  [facility];  Service: Urology;  Laterality: N/A;  . LAPAROSCOPIC INGUINAL HERNIA REPAIR Bilateral 01-10-2004   W/ MESH  . LEFT HEART CATH AND CORONARY ANGIOGRAPHY N/A 02/13/2019   Procedure: LEFT HEART CATH AND CORONARY ANGIOGRAPHY;  Surgeon: Belva Crome, MD;  Location: [facility];  Service: Cardiovascular;  Laterality: N/A;  . neck benign removed from neck  yrs ago  . PROSTATE BIOPSY N/A 07/12/2012   Procedure: PROSTATE BIOPSY AND ULTRASOUND;  Surgeon:  Ailene Rud, MD;  Location[facility];  Service: Urology;  Laterality: N/A;  . PROSTATE SURGERY  2002   tuna  . REMOVAL LEFT NECK LYMPH NODE  1996  . TONSILLECTOMY  CHILD  . TOTAL KNEE ARTHROPLASTY Right 03/12/2017   Procedure: RIGHT TOTAL KNEE ARTHROPLASTY;  Surgeon: Gaynelle Arabian, MD;  [facility];  Service: Orthopedics;  Laterality: Right;  Adductor Block  . TRANSURETHRAL RESECTION OF PROSTATE N/A 08/08/2013   Procedure: TRANSURETHRAL RESECTION OF THE PROSTATE WITH GYRUS INSTRUMENTS;  Surgeon: Ailene Rud, MD;  [facility];   Service: Urology;  Laterality: N/A;  . TRANSURETHRAL RESECTION OF PROSTATE      There were no vitals filed for this visit.   Subjective Assessment - 01/23/20 1020    Subjective Patient reports that runners march went well at gym this morning. No new changes since last visit.    Pertinent History ADHD. B12 Deficiency, BPH, Chronic Lymphocystic Leukemia, Crohn's Disease, CAD, Benign Tremor, Hyperlipidemia, Osteoporosis    Limitations Walking    Patient Stated Goals Be able to feel secure    Currently in Pain? No/denies    Pain Onset More than a month ago                [facility] Adult PT Treatment/Exercise - 01/23/20 0001      Ambulation/Gait   Ambulation/Gait Yes    Ambulation/Gait Assistance 5: Supervision    Ambulation/Gait Assistance Details with activites throughout session    Assistive device None    Gait Pattern Step-through pattern;Narrow base of support    Ambulation Surface Level;Indoor      Neuro Re-ed    Neuro Re-ed Details  Standing on upward incline on blue mat: completed standing with feet apart and completd horizontal/vertical head turns 1 x 10 reps each direction, then followed and completed second set with vision removed. increased balance challenge with EC. Progressed to completed staggered stance 1 x 10 reps of horizontal/vertical head turns and alteranting feet positoin for second set. Completed marching in place x 10 reps with EO, then progressed to completing x 10 reps with EC. increaed balance challenge with eyes closed reuqire CGA from PT. Completed x 2 sets.                Balance Exercises - 01/23/20 0001      Balance Exercises: Standing   SLS with Vectors Solid surface;Limitations    SLS with Vectors Limitations completed walking with stepping over cones with toe tap prior to stepping over to further promtoe SLS, completed x 6 laps down and back.     Rockerboard Anterior/posterior    Rockerboard Limitations standing on large rockerboard with  staggered stance, completed working on A/P weight shifts x 15 reps. followed by x 15 reps with alteranting staggered stance. increased challenge with weight shift forwad but able to complete with good control overall and wihtout UE support. CGA from PT as needed.    Tandem Gait Forward;Retro;Foam/compliant surface;Limitations    Tandem Gait Limitations on blue balance beam; completd x 4 laps forward. progressed to completing retro tandem gait, completed x 3 laps back. Require UE support with completion of retro tandem.     Other Standing Exercises completed standing on airex completing forward step over orange hurdle x 10 reps billaterally, completed initially with UE support and progressed to no UE support. CGA required without UE support               PT Short  Term Goals - 01/16/20 1256      PT SHORT TERM GOAL #1   Title = LTG             PT Long Term Goals - 01/16/20 1256      PT LONG TERM GOAL #1   Title Patient will be independent with final HEP for balance/strengthening/posture (ALL LTGs Due: 02/13/20)    Baseline continue to progress HEP    Time 4    Period Weeks    Status Revised    Target Date 02/13/20      PT LONG TERM GOAL #2   Title Patient will improve FGA to >/= 25/30 to demonstrate improved balance and reduced risk for falls    Baseline 17/30, 22/30    Time 4    Period Weeks    Status Revised      PT LONG TERM GOAL #3   Title Patient will be able to hold situation 4 of M-CTSIB for >/= 15 seconds to demo improved vestibular input for balance.    Baseline 5-6 secs    Time 4    Period Weeks    Status New      PT LONG TERM GOAL #4   Title Patient will demonstrate improvement of vestibular input >/= 25% on Sensory Organization Test to improve balance    Baseline 15%    Time 4    Period Weeks    Status New                 Plan - 01/23/20 1306    Clinical Impression Statement Continued progression of balance exercies, today working on improved  SLS as incorporating into activites and improved A/P weight shift with tasks. Continue to demo impaired balance with vision removed, but is demonstrating progress. Will continue to progress toward all LTGs.    Personal Factors and Comorbidities Comorbidity 3+    Comorbidities ADHD. B12 Deficiency, BPH, Chronic Lymphocystic Leukemia, Crohn's Disease, CAD, Benign Tremor, Hyperlipidemia, Osteoperosis    Examination-Activity Limitations Stairs;Stand;Locomotion Level    Examination-Participation Restrictions Community Activity;Yard Work;Occupation    Stability/Clinical Decision Making Stable/Uncomplicated    Rehab Potential Good    PT Frequency 2x / week    PT Duration 4 weeks    PT Treatment/Interventions ADLs/Self Care Home Management;Cryotherapy;Moist Heat;DME Instruction;Stair training;Functional mobility training;Therapeutic activities;Gait training;Therapeutic exercise;Balance training;Neuromuscular re-education;Patient/family education;Orthotic Fit/Training;Manual techniques;Passive range of motion;Vestibular    PT Next Visit Plan Continue balance exercises targeting vestibular system. (rockerboard/balance beam) and incorporate postural exercises into balance. Walking with head turns. Dual Tasking    Consulted and Agree with Plan of Care Patient           Patient will benefit from skilled therapeutic intervention in order to improve the following deficits and impairments:  Abnormal gait, Decreased balance, Difficulty walking, Postural dysfunction, Pain, Decreased strength, Decreased activity tolerance, Improper body mechanics  Visit Diagnosis: Other abnormalities of gait and mobility  Unsteadiness on feet  Muscle weakness (generalized)  History of falling  Abnormal posture     Problem List Patient Active Problem List   Diagnosis Date Noted  . Angina pectoris ([place]) 02/13/2019  . Pseudophakia of both eyes 08/01/2018  . History of total knee arthroplasty 03/29/2017  . Stiffness  of right knee 03/16/2017  . OA (osteoarthritis) of knee 03/12/2017  . Tremor, essential 12/03/2015  . Essential hypertension 12/09/2014  . Coronary artery disease involving native heart 12/08/2013  . 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[place]) 12/08/2013  . Hyperlipidemia 12/08/2013  . Benign  prostatic hypertrophy 08/08/2013  . Essential and other specified forms of tremor 11/25/2012    Jones Bales, PT, DPT 01/23/2020, 1:07 PM  [facility] [address] [place], [place], [address] Phone: [phone]   Fax:  [phone]  Name: JONATHANDAVID MARLETT MRN: [id number] Date of Birth: 1945-03-11

## 2020-01-26 DIAGNOSIS — E7800 Pure hypercholesterolemia, unspecified: Secondary | ICD-10-CM | POA: Diagnosis not present

## 2020-01-26 DIAGNOSIS — M47816 Spondylosis without myelopathy or radiculopathy, lumbar region: Secondary | ICD-10-CM | POA: Diagnosis not present

## 2020-01-26 DIAGNOSIS — N400 Enlarged prostate without lower urinary tract symptoms: Secondary | ICD-10-CM | POA: Diagnosis not present

## 2020-01-26 DIAGNOSIS — E038 Other specified hypothyroidism: Secondary | ICD-10-CM | POA: Diagnosis not present

## 2020-01-26 DIAGNOSIS — I10 Essential (primary) hypertension: Secondary | ICD-10-CM | POA: Diagnosis not present

## 2020-01-26 DIAGNOSIS — M1990 Unspecified osteoarthritis, unspecified site: Secondary | ICD-10-CM | POA: Diagnosis not present

## 2020-01-26 DIAGNOSIS — K219 Gastro-esophageal reflux disease without esophagitis: Secondary | ICD-10-CM | POA: Diagnosis not present

## 2020-01-26 DIAGNOSIS — I2510 Atherosclerotic heart disease of native coronary artery without angina pectoris: Secondary | ICD-10-CM | POA: Diagnosis not present

## 2020-01-26 DIAGNOSIS — M8580 Other specified disorders of bone density and structure, unspecified site: Secondary | ICD-10-CM | POA: Diagnosis not present

## 2020-01-28 ENCOUNTER — Other Ambulatory Visit: Payer: Self-pay

## 2020-01-28 ENCOUNTER — Ambulatory Visit: Payer: Medicare Other

## 2020-01-28 DIAGNOSIS — M6281 Muscle weakness (generalized): Secondary | ICD-10-CM | POA: Diagnosis not present

## 2020-01-28 DIAGNOSIS — Z9181 History of falling: Secondary | ICD-10-CM | POA: Diagnosis not present

## 2020-01-28 DIAGNOSIS — R2681 Unsteadiness on feet: Secondary | ICD-10-CM

## 2020-01-28 DIAGNOSIS — R293 Abnormal posture: Secondary | ICD-10-CM

## 2020-01-28 DIAGNOSIS — R2689 Other abnormalities of gait and mobility: Secondary | ICD-10-CM

## 2020-01-28 NOTE — Therapy (Signed)
[facility] [address] [place], [place], [address] Phone: [phone]   Fax:  [phone]  Physical Therapy Treatment  Patient Details  Name: Cole Martinez MRN: [id number] Date of Birth: Nov 24, 1945 Referring Provider (PT): Referred by: Delrae Rend, MD. PCP is Josetta Huddle, MD   Encounter Date: 01/28/2020   PT End of Session - 01/28/20 0848    Visit Number 16    Number of Visits 21    Date for PT Re-Evaluation 03/16/20   POC for 4 weeks, Cert for 60 days   Authorization Type Medicare (20th visit PN)    Progress Note Due on Visit 20    PT Start Time 0848    PT Stop Time 0930    PT Time Calculation (min) 42 min    Equipment Utilized During Treatment Gait belt    Activity Tolerance Patient tolerated treatment well    Behavior During Therapy WFL for tasks assessed/performed           Past Medical History:  Diagnosis Date   ADHD (attention deficit hyperactivity disorder)    B12 deficiency    BPH (benign prostatic hypertrophy)    Chronic lymphocytic leukemia (CLL), T-cell ([place]) [place]--  ONCOLOGIST-  DR Benay Spice   PT IS ASYMPTOMATIC--- LAST CBC W/ DIFF 06-24-2012 STABLE   Coronary artery disease CARDIOLOGIST- DR Daneen Schick   S/P STENTING LAD 1999 // Myoview 01/2019: EF 62, normal perfusion; Low Risk   Crohn's disease of ileum ([place]) [place]   ED (erectile dysfunction)    Elevated PSA    Essential and other specified forms of tremor 11/25/2012   H/O adenomatous polyp of colon    Hyperlipidemia    Nocturia    OA (osteoarthritis)    Peripheral neuropathy    hx of, none current as of 08-04-13   Peyronie disease    S/P coronary artery stent placement OCT 1999 OF LAD   Tremor, hereditary, benign MILD RIGHT HAND    Past Surgical History:  Procedure Laterality Date   CARDIOVASCULAR STRESS TEST  11-01-2010 DR Daneen Schick   NORMAL PERFUSION STUDY/ EF 64%/ NO ISCHEMIA   cataract surgery   Bilateral feburary 2020   with lens placement    COLONOSCOPY WITH PROPOFOL N/A 09/24/2012   Procedure: COLONOSCOPY WITH PROPOFOL;  Surgeon: [name], MD;  [facility];  Service: Endoscopy;  Laterality: N/A;   CORONARY ANGIOPLASTY WITH STENT PLACEMENT  OCT 1999   STENT OF LAD   CORONARY STENT INTERVENTION N/A 02/14/2019   Procedure: CORONARY STENT INTERVENTION;  Surgeon: Belva Crome, MD;  Location: [facility];  Service: Cardiovascular;  Laterality: N/A;   CYSTOSCOPY WITH URETHRAL DILATATION  03/12/2017   Procedure: CYSTOSCOPY WITH URETHRAL DILATATION;  Surgeon: Gaynelle Arabian, MD;  [facility];  Service: Orthopedics;;  Ammie Dalton, Resident Assisting   CYSTOSCOPY WITH URETHRAL DILATATION N/A 10/03/2018   Procedure: CYSTOSCOPY WITH URETHRAL BALLOON DILATATION WITH BILATERAL RETROGRADE PYELOGRAPHY;  Surgeon: Raynelle Bring, MD;  [facility];  Service: Urology;  Laterality: N/A;   LAPAROSCOPIC INGUINAL HERNIA REPAIR Bilateral 01-10-2004   W/ MESH   LEFT HEART CATH AND CORONARY ANGIOGRAPHY N/A 02/13/2019   Procedure: LEFT HEART CATH AND CORONARY ANGIOGRAPHY;  Surgeon: Belva Crome, MD;  Location: [facility];  Service: Cardiovascular;  Laterality: N/A;   neck benign removed from neck  yrs ago   PROSTATE BIOPSY N/A 07/12/2012   Procedure: PROSTATE BIOPSY AND ULTRASOUND;  Surgeon:  Ailene Rud, MD;  Location[facility];  Service: Urology;  Laterality: N/A;   PROSTATE SURGERY  2002   tuna   REMOVAL LEFT NECK LYMPH NODE  1996   TONSILLECTOMY  CHILD   TOTAL KNEE ARTHROPLASTY Right 03/12/2017   Procedure: RIGHT TOTAL KNEE ARTHROPLASTY;  Surgeon: Gaynelle Arabian, MD;  [facility];  Service: Orthopedics;  Laterality: Right;  Adductor Block   TRANSURETHRAL RESECTION OF PROSTATE N/A 08/08/2013   Procedure: TRANSURETHRAL RESECTION OF THE PROSTATE WITH GYRUS INSTRUMENTS;  Surgeon: Ailene Rud, MD;  [facility];   Service: Urology;  Laterality: N/A;   TRANSURETHRAL RESECTION OF PROSTATE      There were no vitals filed for this visit.   Subjective Assessment - 01/28/20 0850    Subjective Patient reports no new changes/complaints since last visit. No pain.    Pertinent History ADHD. B12 Deficiency, BPH, Chronic Lymphocystic Leukemia, Crohn's Disease, CAD, Benign Tremor, Hyperlipidemia, Osteoporosis    Limitations Walking    Patient Stated Goals Be able to feel secure    Currently in Pain? No/denies    Pain Onset More than a month ago                [facility] Adult PT Treatment/Exercise - 01/28/20 0001      Ambulation/Gait   Ambulation/Gait Yes    Ambulation/Gait Assistance 5: Supervision    Ambulation/Gait Assistance Details with high level balance    Assistive device None    Gait Pattern Step-through pattern;Narrow base of support    Ambulation Surface Level;Indoor      High Level Balance   High Level Balance Activities Head turns;Sudden stops;Turns;Backward walking    High Level Balance Comments Completed ambulation x 230 ft with horizontal/vertical head turns with ambulation. Completed ambulation with sudden stops, full body turns, and backwards walking, completed 100' x 2. One instance of imbalance with full body turn requiring CGA from PT for steadying.       Neuro Re-ed    Neuro Re-ed Details  At countertop with BOSU completed step up and runners march, alternating BLE completed x 10 reps each. Single UE support required and CGA from PT, verbal cues for slowed pace to promote control. Completed steps up/over BOSU with BUE support from countertop x 10 reps each direction, verbal cues needed for foot placement. With flat side of BOSU up, completed standing with wide BOS and single UE support working on holding steady, completed 3 x 60 seconds each progressing to no UE suppotr. Increased CGA required from PT when completed without UE support.                Balance Exercises -  01/28/20 0001      Balance Exercises: Standing   Standing Eyes Opened Narrow base of support (BOS);Head turns;Foam/compliant surface;Limitations    Standing Eyes Opened Limitations completed with narrow BOS and horizontal/vertical head turns x 10 reps each    Standing Eyes Closed Wide (BOA);Head turns;Foam/compliant surface;3 reps;30 secs;Limitations    Standing Eyes Closed Limitations completed with 2-3 inches of space between feet, completed standing with EC 3 x 30 seconds. progressing toward narrowing BOS as tolerated by patient. With Wide BOS and eyes closed, completed horizontal/vertical head turns x 10 reps. CGA due to increased sway    Tandem Stance Eyes open;Intermittent upper extremity support;2 reps;30 secs;Limitations    Tandem Stance Time completed full tanem with EO 2 x 30 seconds each foot. Progressed to 3/4th tandem with eyes open  and addition of horizontal/vertical head turns x 10 reps, alternating foot position. Increased cahllenge with horizontal>vertical and with RLE positioned posterior.     Tandem Gait Forward;Retro;3 reps;Limitations    Tandem Gait Limitations completed forward/retro tandem gait at countertop, no UE support working on improved control and balance with completion. Completed x 3 laps down and back.                PT Short Term Goals - 01/16/20 1256      PT SHORT TERM GOAL #1   Title = LTG             PT Long Term Goals - 01/16/20 1256      PT LONG TERM GOAL #1   Title Patient will be independent with final HEP for balance/strengthening/posture (ALL LTGs Due: 02/13/20)    Baseline continue to progress HEP    Time 4    Period Weeks    Status Revised    Target Date 02/13/20      PT LONG TERM GOAL #2   Title Patient will improve FGA to >/= 25/30 to demonstrate improved balance and reduced risk for falls    Baseline 17/30, 22/30    Time 4    Period Weeks    Status Revised      PT LONG TERM GOAL #3   Title Patient will be able to hold  situation 4 of M-CTSIB for >/= 15 seconds to demo improved vestibular input for balance.    Baseline 5-6 secs    Time 4    Period Weeks    Status New      PT LONG TERM GOAL #4   Title Patient will demonstrate improvement of vestibular input >/= 25% on Sensory Organization Test to improve balance    Baseline 15%    Time 4    Period Weeks    Status New                 Plan - 01/28/20 1027    Clinical Impression Statement Continued progression of balance exercises. Continued focus on targeting vestibular system with vision removed and incorporating of head turns. Progressed to activites on BOSU with patient tolerating well,just increased CGA required due to balance challenge. Will continue per POC.    Personal Factors and Comorbidities Comorbidity 3+    Comorbidities ADHD. B12 Deficiency, BPH, Chronic Lymphocystic Leukemia, Crohn's Disease, CAD, Benign Tremor, Hyperlipidemia, Osteoperosis    Examination-Activity Limitations Stairs;Stand;Locomotion Level    Examination-Participation Restrictions Community Activity;Yard Work;Occupation    Stability/Clinical Decision Making Stable/Uncomplicated    Rehab Potential Good    PT Frequency 2x / week    PT Duration 4 weeks    PT Treatment/Interventions ADLs/Self Care Home Management;Cryotherapy;Moist Heat;DME Instruction;Stair training;Functional mobility training;Therapeutic activities;Gait training;Therapeutic exercise;Balance training;Neuromuscular re-education;Patient/family education;Orthotic Fit/Training;Manual techniques;Passive range of motion;Vestibular    PT Next Visit Plan Continue balance exercises targeting vestibular system. (rockerboard/balance beam) and incorporate postural exercises into balance. Walking with head turns. Dual Tasking    Consulted and Agree with Plan of Care Patient           Patient will benefit from skilled therapeutic intervention in order to improve the following deficits and impairments:  Abnormal  gait, Decreased balance, Difficulty walking, Postural dysfunction, Pain, Decreased strength, Decreased activity tolerance, Improper body mechanics  Visit Diagnosis: Other abnormalities of gait and mobility  Unsteadiness on feet  Muscle weakness (generalized)  History of falling  Abnormal posture     Problem List Patient Active  Problem List   Diagnosis Date Noted   Angina pectoris ([place]) 02/13/2019   Pseudophakia of both eyes 08/01/2018   History of total knee arthroplasty 03/29/2017   Stiffness of right knee 03/16/2017   OA (osteoarthritis) of knee 03/12/2017   Tremor, essential 12/03/2015   Essential hypertension 12/09/2014   Coronary artery disease involving native heart 12/08/2013   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[place]) 12/08/2013   Hyperlipidemia 12/08/2013   Benign prostatic hypertrophy 08/08/2013   Essential and other specified forms of tremor 11/25/2012    Jones Bales, PT, DPT 01/28/2020, 10:28 AM  [facility] [address] [place], [place], [address] Phone: [phone]   Fax:  [phone]  Name: Cole Martinez MRN: [id number] Date of Birth: 18-Feb-1946

## 2020-02-03 ENCOUNTER — Ambulatory Visit: Payer: Medicare Other | Admitting: Physical Therapy

## 2020-02-03 ENCOUNTER — Other Ambulatory Visit: Payer: Self-pay

## 2020-02-03 ENCOUNTER — Encounter: Payer: Self-pay | Admitting: Physical Therapy

## 2020-02-03 DIAGNOSIS — R2681 Unsteadiness on feet: Secondary | ICD-10-CM | POA: Diagnosis not present

## 2020-02-03 DIAGNOSIS — M6281 Muscle weakness (generalized): Secondary | ICD-10-CM | POA: Diagnosis not present

## 2020-02-03 DIAGNOSIS — R2689 Other abnormalities of gait and mobility: Secondary | ICD-10-CM | POA: Diagnosis not present

## 2020-02-03 DIAGNOSIS — Z9181 History of falling: Secondary | ICD-10-CM | POA: Diagnosis not present

## 2020-02-03 DIAGNOSIS — R293 Abnormal posture: Secondary | ICD-10-CM | POA: Diagnosis not present

## 2020-02-03 NOTE — Therapy (Signed)
[facility] [address] [place], [place], [address] Phone: [phone]   Fax:  [phone]  Physical Therapy Treatment  Patient Details  Name: Cole Martinez MRN: [id number] Date of Birth: 1945-12-23 Referring Provider (PT): Referred by: Delrae Rend, MD. PCP is Josetta Huddle, MD   Encounter Date: 02/03/2020   PT End of Session - 02/03/20 1431    Visit Number 17    Number of Visits 21    Date for PT Re-Evaluation 03/16/20   POC for 4 weeks, Cert for 60 days   Authorization Type Medicare (20th visit PN)    Progress Note Due on Visit 20    PT Start Time 1101    PT Stop Time 1143    PT Time Calculation (min) 42 min    Equipment Utilized During Treatment Gait belt    Activity Tolerance Patient tolerated treatment well    Behavior During Therapy WFL for tasks assessed/performed           Past Medical History:  Diagnosis Date   ADHD (attention deficit hyperactivity disorder)    B12 deficiency    BPH (benign prostatic hypertrophy)    Chronic lymphocytic leukemia (CLL), T-cell ([place]) [place]--  ONCOLOGIST-  DR Benay Spice   PT IS ASYMPTOMATIC--- LAST CBC W/ DIFF 06-24-2012 STABLE   Coronary artery disease CARDIOLOGIST- DR Daneen Schick   S/P STENTING LAD 1999 // Myoview 01/2019: EF 62, normal perfusion; Low Risk   Crohn's disease of ileum ([place]) [place]   ED (erectile dysfunction)    Elevated PSA    Essential and other specified forms of tremor 11/25/2012   H/O adenomatous polyp of colon    Hyperlipidemia    Nocturia    OA (osteoarthritis)    Peripheral neuropathy    hx of, none current as of 08-04-13   Peyronie disease    S/P coronary artery stent placement OCT 1999 OF LAD   Tremor, hereditary, benign MILD RIGHT HAND    Past Surgical History:  Procedure Laterality Date   CARDIOVASCULAR STRESS TEST  11-01-2010 DR Daneen Schick   NORMAL PERFUSION STUDY/ EF 64%/ NO ISCHEMIA   cataract surgery   Bilateral feburary 2020   with lens placement    COLONOSCOPY WITH PROPOFOL N/A 09/24/2012   Procedure: COLONOSCOPY WITH PROPOFOL;  Surgeon: [name], MD;  [facility];  Service: Endoscopy;  Laterality: N/A;   CORONARY ANGIOPLASTY WITH STENT PLACEMENT  OCT 1999   STENT OF LAD   CORONARY STENT INTERVENTION N/A 02/14/2019   Procedure: CORONARY STENT INTERVENTION;  Surgeon: Belva Crome, MD;  Location: [facility];  Service: Cardiovascular;  Laterality: N/A;   CYSTOSCOPY WITH URETHRAL DILATATION  03/12/2017   Procedure: CYSTOSCOPY WITH URETHRAL DILATATION;  Surgeon: Gaynelle Arabian, MD;  [facility]: [facility];  Service: Orthopedics;;  Ammie Dalton, Resident Assisting   CYSTOSCOPY WITH URETHRAL DILATATION N/A 10/03/2018   Procedure: CYSTOSCOPY WITH URETHRAL BALLOON DILATATION WITH BILATERAL RETROGRADE PYELOGRAPHY;  Surgeon: Raynelle Bring, MD;  [facility];  Service: Urology;  Laterality: N/A;   LAPAROSCOPIC INGUINAL HERNIA REPAIR Bilateral 01-10-2004   W/ MESH   LEFT HEART CATH AND CORONARY ANGIOGRAPHY N/A 02/13/2019   Procedure: LEFT HEART CATH AND CORONARY ANGIOGRAPHY;  Surgeon: Belva Crome, MD;  Location: [facility];  Service: Cardiovascular;  Laterality: N/A;   neck benign removed from neck  yrs ago   PROSTATE BIOPSY N/A 07/12/2012   Procedure: PROSTATE BIOPSY AND ULTRASOUND;  Surgeon:  Ailene Rud, MD;  Location: [facility];  Service: Urology;  Laterality: N/A;   PROSTATE SURGERY  2002   tuna   REMOVAL LEFT NECK LYMPH NODE  1996   TONSILLECTOMY  CHILD   TOTAL KNEE ARTHROPLASTY Right 03/12/2017   Procedure: RIGHT TOTAL KNEE ARTHROPLASTY;  Surgeon: Gaynelle Arabian, MD;  [facility];  Service: Orthopedics;  Laterality: Right;  Adductor Block   TRANSURETHRAL RESECTION OF PROSTATE N/A 08/08/2013   Procedure: TRANSURETHRAL RESECTION OF THE PROSTATE WITH GYRUS INSTRUMENTS;  Surgeon: Ailene Rud, MD;  [facility];   Service: Urology;  Laterality: N/A;   TRANSURETHRAL RESECTION OF PROSTATE      There were no vitals filed for this visit.   Subjective Assessment - 02/03/20 1103    Subjective Had a fall the other day on friday night. Came in from walking the dog and wasn't sure what happened. Fell on the knees and the elbows. Notices sometimes when he turns too quickly, he feels off balance.    Pertinent History ADHD. B12 Deficiency, BPH, Chronic Lymphocystic Leukemia, Crohn's Disease, CAD, Benign Tremor, Hyperlipidemia, Osteoporosis    Limitations Walking    Patient Stated Goals Be able to feel secure    Currently in Pain? Yes    Pain Score 2     Pain Location Knee    Pain Orientation Right    Pain Descriptors / Indicators Sore    Pain Type Acute pain   from recent fall   Pain Onset More than a month ago    Pain Relieving Factors "not really"                                  Balance Exercises - 02/03/20 0001      Balance Exercises: Standing   Standing Eyes Closed Limitations    Standing Eyes Closed Time feet hip width distance eyes closed 2 x 30 seconds, more wide BOS head turns x10 reps, head nods x10 reps, diagonal head turns 2 x 5 reps each direction - with intermittent touch to wall/chair for balance    SLS with Vectors Intermittent upper extremity assist;Upper extremity assist 1;Foam/compliant surface    SLS with Vectors Limitations on blue foam beam: alternating toe taps to single cone, then forward cone tap and cross body cone tap x10 reps B, needing single UE support/intermittent support   Wall Bumps Hip;Eyes opened;Eyes closed;Limitations    Wall Bumps Limitations on blue air ex x10 reps eyes open, x5 reps eyes closed  Cues for proper technique    Toe Raise Both;Limitations    Toe Raise Limitations on blue air ex, eyes open heel <> toe raises x12 reps with no UE support    Other Standing Exercises on blue foam beam: tandem stance bringing one foot forwards and then  other foot back x12 reps B, needing single UE support for balance, a couple attempts without holding on   Other Standing Exercises Comments on blue air ex: diagonal lifts and chops with small ball with cues to turn head and look at ball when performing x10 reps each, then performing on blue balance beam  x5 reps B with min guard/min A and pt needing to utilize stepping strategy for balance               PT Short Term Goals - 01/16/20 1256      PT SHORT TERM GOAL #1  Title = LTG             PT Long Term Goals - 01/16/20 1256      PT LONG TERM GOAL #1   Title Patient will be independent with final HEP for balance/strengthening/posture (ALL LTGs Due: 02/13/20)    Baseline continue to progress HEP    Time 4    Period Weeks    Status Revised    Target Date 02/13/20      PT LONG TERM GOAL #2   Title Patient will improve FGA to >/= 25/30 to demonstrate improved balance and reduced risk for falls    Baseline 17/30, 22/30    Time 4    Period Weeks    Status Revised      PT LONG TERM GOAL #3   Title Patient will be able to hold situation 4 of M-CTSIB for >/= 15 seconds to demo improved vestibular input for balance.    Baseline 5-6 secs    Time 4    Period Weeks    Status New      PT LONG TERM GOAL #4   Title Patient will demonstrate improvement of vestibular input >/= 25% on Sensory Organization Test to improve balance    Baseline 15%    Time 4    Period Weeks    Status New                 Plan - 02/03/20 1431    Clinical Impression Statement Today's skilled session continued to focus on balance strategies with SLS, compliant surfaces and vision removed. Pt continues to be challenged with vision removed with head motions, esp in the diagonal direction. Will continue to progress towards LTGs.    Personal Factors and Comorbidities Comorbidity 3+    Comorbidities ADHD. B12 Deficiency, BPH, Chronic Lymphocystic Leukemia, Crohn's Disease, CAD, Benign Tremor,  Hyperlipidemia, Osteoperosis    Examination-Activity Limitations Stairs;Stand;Locomotion Level    Examination-Participation Restrictions Community Activity;Yard Work;Occupation    Stability/Clinical Decision Making Stable/Uncomplicated    Rehab Potential Good    PT Frequency 2x / week    PT Duration 4 weeks    PT Treatment/Interventions ADLs/Self Care Home Management;Cryotherapy;Moist Heat;DME Instruction;Stair training;Functional mobility training;Therapeutic activities;Gait training;Therapeutic exercise;Balance training;Neuromuscular re-education;Patient/family education;Orthotic Fit/Training;Manual techniques;Passive range of motion;Vestibular    PT Next Visit Plan Continue balance exercises targeting vestibular system. (rockerboard/balance beam) and incorporate postural exercises into balance. Walking with head turns. Dual Tasking    Consulted and Agree with Plan of Care Patient           Patient will benefit from skilled therapeutic intervention in order to improve the following deficits and impairments:  Abnormal gait, Decreased balance, Difficulty walking, Postural dysfunction, Pain, Decreased strength, Decreased activity tolerance, Improper body mechanics  Visit Diagnosis: Other abnormalities of gait and mobility  Unsteadiness on feet  Muscle weakness (generalized)  History of falling     Problem List Patient Active Problem List   Diagnosis Date Noted   Angina pectoris ([place]) 02/13/2019   Pseudophakia of both eyes 08/01/2018   History of total knee arthroplasty 03/29/2017   Stiffness of right knee 03/16/2017   OA (osteoarthritis) of knee 03/12/2017   Tremor, essential 12/03/2015   Essential hypertension 12/09/2014   Coronary artery disease involving native heart 12/08/2013   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[place]) 12/08/2013   Hyperlipidemia 12/08/2013   Benign prostatic hypertrophy 08/08/2013   Essential and other specified forms of tremor 11/25/2012     Arliss Journey, PT, DPT  02/03/2020, 2:32 PM  [facility] [address] [place], [place], [address] Phone: [phone]   Fax:  [phone]  Name: Cole Martinez MRN: [id number] Date of Birth: [birth year]

## 2020-02-05 ENCOUNTER — Ambulatory Visit: Payer: Medicare Other | Attending: Internal Medicine

## 2020-02-05 ENCOUNTER — Other Ambulatory Visit: Payer: Self-pay

## 2020-02-05 DIAGNOSIS — R2689 Other abnormalities of gait and mobility: Secondary | ICD-10-CM

## 2020-02-05 DIAGNOSIS — R2681 Unsteadiness on feet: Secondary | ICD-10-CM | POA: Diagnosis not present

## 2020-02-05 DIAGNOSIS — Z9181 History of falling: Secondary | ICD-10-CM | POA: Insufficient documentation

## 2020-02-05 DIAGNOSIS — R293 Abnormal posture: Secondary | ICD-10-CM | POA: Diagnosis not present

## 2020-02-05 DIAGNOSIS — M6281 Muscle weakness (generalized): Secondary | ICD-10-CM | POA: Diagnosis not present

## 2020-02-05 NOTE — Patient Instructions (Signed)
Access Code: [email or web address] URL: [email or web address] Date: 02/05/2020 Prepared by: Baldomero Lamy  Exercises Romberg Stance with Eyes Closed - 1 x daily - 5 x weekly - 1 sets - 3 reps - 30 hold Feet Apart with Head Nods on Foam Pad - 1 x daily - 5 x weekly - 2 sets - 10 reps Tandem Stance - 1 x daily - 5 x weekly - 1 sets - 6 reps - 30 hold Single Leg Stance with Support - 1 x daily - 5 x weekly - 1 sets - 3 reps - 5-15 secs hold Tandem Walking with Counter Support - 1 x daily - 5 x weekly - 3 sets Scapular Retraction with Resistance - 1 x daily - 5 x weekly - 2 sets - 10 reps - 3 hold Doorway Pec Stretch at 60 Elevation - 1 x daily - 5 x weekly - 1 sets - 3 reps - 30 hold Supine Chin Tuck - 1 x daily - 5 x weekly - 2 sets - 10 reps - 3 hold Standing Shoulder Horizontal Abduction with Resistance - 1 x daily - 5 x weekly - 2 sets - 10 reps - 2-3 hold Runner's Step Up/Down - 1 x daily - 5 x weekly - 2 sets - 10 reps Romberg Stance with Head Nods with Eyes Closed - 1 x daily - 7 x weekly - 2 sets - 10 reps Romberg Stance with Head Rotation and Eyes Closed - 1 x daily - 7 x weekly - 3 sets - 10 reps

## 2020-02-05 NOTE — Therapy (Signed)
[facility] [address] [place], [place], [address] Phone: [phone]   Fax:  [phone]  Physical Therapy Treatment  Patient Details  Name: Cole Martinez MRN: [id number] Date of Birth: [birth year] Referring Provider (PT): Referred by: Delrae Rend, MD. PCP is Josetta Huddle, MD   Encounter Date: 02/05/2020   PT End of Session - 02/05/20 1235    Visit Number 18    Number of Visits 21    Date for PT Re-Evaluation 03/16/20   POC for 4 weeks, Cert for 60 days   Authorization Type Medicare (20th visit PN)    Progress Note Due on Visit 20    PT Start Time 1233    PT Stop Time 1316    PT Time Calculation (min) 43 min    Equipment Utilized During Treatment Gait belt    Activity Tolerance Patient tolerated treatment well    Behavior During Therapy WFL for tasks assessed/performed           Past Medical History:  Diagnosis Date  . ADHD (attention deficit hyperactivity disorder)   . B12 deficiency   . BPH (benign prostatic hypertrophy)   . Chronic lymphocytic leukemia (CLL), T-cell ([place]) DX 1996--  ONCOLOGIST-  DR [facility]   PT IS ASYMPTOMATIC--- LAST CBC W/ DIFF 06-24-2012 STABLE  . Coronary artery disease CARDIOLOGIST- DR Daneen Schick   S/P STENTING LAD 1999 // Myoview 01/2019: EF 62, normal perfusion; Low Risk  . Crohn's disease of ileum ([place]) SINCE 1988  . ED (erectile dysfunction)   . Elevated PSA   . Essential and other specified forms of tremor 11/25/2012  . H/O adenomatous polyp of colon   . Hyperlipidemia   . Nocturia   . OA (osteoarthritis)   . Peripheral neuropathy    hx of, none current as of 08-04-13  . Peyronie disease   . S/P coronary artery stent placement OCT 1999 OF LAD  . Tremor, hereditary, benign MILD RIGHT HAND    Past Surgical History:  Procedure Laterality Date  . CARDIOVASCULAR STRESS TEST  11-01-2010 DR Daneen Schick   NORMAL PERFUSION STUDY/ EF 64%/ NO ISCHEMIA  . cataract surgery   Bilateral feburary 2020   with lens placement   . COLONOSCOPY WITH PROPOFOL N/A 09/24/2012   Procedure: COLONOSCOPY WITH PROPOFOL;  Surgeon: [name], MD;  [facility];  Service: Endoscopy;  Laterality: N/A;  . CORONARY ANGIOPLASTY WITH STENT PLACEMENT  OCT 1999   STENT OF LAD  . CORONARY STENT INTERVENTION N/A 02/14/2019   Procedure: CORONARY STENT INTERVENTION;  Surgeon: Belva Crome, MD;  Location: [facility];  Service: Cardiovascular;  Laterality: N/A;  . CYSTOSCOPY WITH URETHRAL DILATATION  03/12/2017   Procedure: CYSTOSCOPY WITH URETHRAL DILATATION;  Surgeon: Gaynelle Arabian, MD;  [facility];  Service: Orthopedics;;  Ammie Dalton, Resident Assisting  . CYSTOSCOPY WITH URETHRAL DILATATION N/A 10/03/2018   Procedure: CYSTOSCOPY WITH URETHRAL BALLOON DILATATION WITH BILATERAL RETROGRADE PYELOGRAPHY;  Surgeon: Raynelle Bring, MD;  [facility];  Service: Urology;  Laterality: N/A;  . LAPAROSCOPIC INGUINAL HERNIA REPAIR Bilateral 01-10-2004   W/ MESH  . LEFT HEART CATH AND CORONARY ANGIOGRAPHY N/A 02/13/2019   Procedure: LEFT HEART CATH AND CORONARY ANGIOGRAPHY;  Surgeon: Belva Crome, MD;  Location: [facility];  Service: Cardiovascular;  Laterality: N/A;  . neck benign removed from neck  yrs ago  . PROSTATE BIOPSY N/A 07/12/2012   Procedure: PROSTATE BIOPSY AND ULTRASOUND;  Surgeon:  Ailene Rud, MD;  Location: [facility];  Service: Urology;  Laterality: N/A;  . PROSTATE SURGERY  2002   tuna  . REMOVAL LEFT NECK LYMPH NODE  1996  . TONSILLECTOMY  CHILD  . TOTAL KNEE ARTHROPLASTY Right 03/12/2017   Procedure: RIGHT TOTAL KNEE ARTHROPLASTY;  Surgeon: Gaynelle Arabian, MD;  [facility];  Service: Orthopedics;  Laterality: Right;  Adductor Block  . TRANSURETHRAL RESECTION OF PROSTATE N/A 08/08/2013   Procedure: TRANSURETHRAL RESECTION OF THE PROSTATE WITH GYRUS INSTRUMENTS;  Surgeon: Ailene Rud, MD;  [facility];   Service: Urology;  Laterality: N/A;  . TRANSURETHRAL RESECTION OF PROSTATE      There were no vitals filed for this visit.   Subjective Assessment - 02/05/20 1236    Subjective Patient reports no new changes since last visit. No falls to report. Patient reports fatigue.    Pertinent History ADHD. B12 Deficiency, BPH, Chronic Lymphocystic Leukemia, Crohn's Disease, CAD, Benign Tremor, Hyperlipidemia, Osteoporosis    Limitations Walking    Patient Stated Goals Be able to feel secure    Currently in Pain? Yes    Pain Score 2     Pain Location Elbow    Pain Orientation Right    Pain Descriptors / Indicators Aching    Pain Type Acute pain    Pain Onset More than a month ago              [facility] Adult PT Treatment/Exercise - 02/05/20 0001      Ambulation/Gait   Ambulation/Gait Yes    Ambulation/Gait Assistance 5: Supervision    Ambulation/Gait Assistance Details with high level balance activities     Assistive device None    Gait Pattern Step-through pattern;Narrow base of support    Ambulation Surface Level;Indoor      High Level Balance   High Level Balance Activities Turns    High Level Balance Comments Completed ambulation with sudden turns to R/L x 5 each direction, no imbalance ntoed on firm surface. Progressed to completed turns on complaint surfaces (red/blue mat) x 8 each direction. Increased balance challenge with increased speed. PT educating on improved technique with turns to promtoe safety and further reduce fall risk.             Balance Exercises - 02/05/20 0001      Balance Exercises: Standing   Standing Eyes Closed Narrow base of support (BOS);Head turns;Solid surface;2 reps;Limitations    Standing Eyes Closed Time completed narrow BOS and eyes closed, with head turns 1 x 10 reps horizontal and vertical. Updated HEP to include horizontal/vertical head turns,     Tandem Stance Eyes open;Eyes closed;Intermittent upper extremity support;2 reps;30 secs;Limitations     Tandem Stance Time completed 2 x 30 with full tandem and EO on BLE; progressed to 1/2 tandem with EC 2 x 30 seconds. Increased challenge with vision removed    Balance Beam Standing across blue balance beam, completed with feet shoulder width apart holding steady x 1 minute, followed by progression to eyes closed 3 x 20-25 seconds, intermittent UE support required with vision removed. Completed horizontal/vertical head turns 1 x 10 reps each. Increased balance challenge with vision removed, intermittent UE support and CGA from PT.     Tandem Gait Forward;Retro;Foam/compliant surface;4 reps;Limitations    Tandem Gait Limitations completed forward/retro x 4 laps of blue mat. UE support required with retro, no UE support with forwards.     Other Standing Exercises on BOSU (with  flat side up): completed static standing with wide BOS 3 x 1 minute each. Progressed to completing lateral weight shifts to R/L x 15 reps, and ant/post weight shift x 15 reps. Intermittent UE support required for completion.           Updated HEP:  Access Code: [email or web address] URL: [email or web address] Date: 02/05/2020 Prepared by: Baldomero Lamy  Exercises Romberg Stance with Eyes Closed - 1 x daily - 5 x weekly - 1 sets - 3 reps - 30 hold Feet Apart with Head Nods on Foam Pad - 1 x daily - 5 x weekly - 2 sets - 10 reps Tandem Stance - 1 x daily - 5 x weekly - 1 sets - 6 reps - 30 hold Single Leg Stance with Support - 1 x daily - 5 x weekly - 1 sets - 3 reps - 5-15 secs hold Tandem Walking with Counter Support - 1 x daily - 5 x weekly - 3 sets Scapular Retraction with Resistance - 1 x daily - 5 x weekly - 2 sets - 10 reps - 3 hold Doorway Pec Stretch at 60 Elevation - 1 x daily - 5 x weekly - 1 sets - 3 reps - 30 hold Supine Chin Tuck - 1 x daily - 5 x weekly - 2 sets - 10 reps - 3 hold Standing Shoulder Horizontal Abduction with Resistance - 1 x daily - 5 x weekly - 2 sets - 10 reps - 2-3 hold Runner's Step  Up/Down - 1 x daily - 5 x weekly - 2 sets - 10 reps Romberg Stance with Head Nods with Eyes Closed - 1 x daily - 7 x weekly - 2 sets - 10 reps Romberg Stance with Head Rotation and Eyes Closed - 1 x daily - 7 x weekly - 3 sets - 10 reps     PT Education - 02/05/20 1739    Education Details HEP Update    Person(s) Educated Patient    Methods Explanation;Demonstration    Comprehension Verbalized understanding;Returned demonstration            PT Short Term Goals - 01/16/20 1256      PT SHORT TERM GOAL #1   Title = LTG             PT Long Term Goals - 01/16/20 1256      PT LONG TERM GOAL #1   Title Patient will be independent with final HEP for balance/strengthening/posture (ALL LTGs Due: 02/13/20)    Baseline continue to progress HEP    Time 4    Period Weeks    Status Revised    Target Date 02/13/20      PT LONG TERM GOAL #2   Title Patient will improve FGA to >/= 25/30 to demonstrate improved balance and reduced risk for falls    Baseline 17/30, 22/30    Time 4    Period Weeks    Status Revised      PT LONG TERM GOAL #3   Title Patient will be able to hold situation 4 of M-CTSIB for >/= 15 seconds to demo improved vestibular input for balance.    Baseline 5-6 secs    Time 4    Period Weeks    Status New      PT LONG TERM GOAL #4   Title Patient will demonstrate improvement of vestibular input >/= 25% on Sensory Organization Test to improve balance    Baseline 15%    Time 4  Period Weeks    Status New                 Plan - 02/05/20 1738    Clinical Impression Statement Today's skilled PT session included continued balance exercises focused on high level balance, and activities targeting improved vestibular system function. Pt. continues to demo increased challenge with vision removed. Also incorporated full body turns on firm/complaint surfaces due to recent fall with turn. Will continue to progress toward all LTGs.    Personal Factors and  Comorbidities Comorbidity 3+    Comorbidities ADHD. B12 Deficiency, BPH, Chronic Lymphocystic Leukemia, Crohn's Disease, CAD, Benign Tremor, Hyperlipidemia, Osteoperosis    Examination-Activity Limitations Stairs;Stand;Locomotion Level    Examination-Participation Restrictions Community Activity;Yard Work;Occupation    Stability/Clinical Decision Making Stable/Uncomplicated    Rehab Potential Good    PT Frequency 2x / week    PT Duration 4 weeks    PT Treatment/Interventions ADLs/Self Care Home Management;Cryotherapy;Moist Heat;DME Instruction;Stair training;Functional mobility training;Therapeutic activities;Gait training;Therapeutic exercise;Balance training;Neuromuscular re-education;Patient/family education;Orthotic Fit/Training;Manual techniques;Passive range of motion;Vestibular    PT Next Visit Plan SLS activities. Continue balance exercises targeting vestibular system. (rockerboard/balance beam) and incorporate postural exercises into balance. Walking with head turns. Dual Tasking    Consulted and Agree with Plan of Care Patient           Patient will benefit from skilled therapeutic intervention in order to improve the following deficits and impairments:  Abnormal gait, Decreased balance, Difficulty walking, Postural dysfunction, Pain, Decreased strength, Decreased activity tolerance, Improper body mechanics  Visit Diagnosis: Other abnormalities of gait and mobility  Unsteadiness on feet  Muscle weakness (generalized)  History of falling  Abnormal posture     Problem List Patient Active Problem List   Diagnosis Date Noted  . Angina pectoris ([place]) 02/13/2019  . Pseudophakia of both eyes 08/01/2018  . History of total knee arthroplasty 03/29/2017  . Stiffness of right knee 03/16/2017  . OA (osteoarthritis) of knee 03/12/2017  . Tremor, essential 12/03/2015  . Essential hypertension 12/09/2014  . Coronary artery disease involving native heart 12/08/2013  . Malignant  lymphoma-small cell ([place]) 12/08/2013  . Hyperlipidemia 12/08/2013  . Benign prostatic hypertrophy 08/08/2013  . Essential and other specified forms of tremor 11/25/2012    Jones Bales, PT, DPT 02/05/2020, 5:40 PM  [facility] [address] [place], [place], [address] Phone: [phone]   Fax:  [phone]  Name: Cole Martinez MRN: [id number] Date of Birth: 1945-12-20

## 2020-02-10 ENCOUNTER — Other Ambulatory Visit: Payer: Self-pay

## 2020-02-10 ENCOUNTER — Ambulatory Visit (INDEPENDENT_AMBULATORY_CARE_PROVIDER_SITE_OTHER): Payer: Medicare Other

## 2020-02-10 DIAGNOSIS — L570 Actinic keratosis: Secondary | ICD-10-CM | POA: Diagnosis not present

## 2020-02-10 MED ORDER — AMINOLEVULINIC ACID HCL 10 % EX GEL
2000.0000 mg | Freq: Once | CUTANEOUS | Status: AC
  Administered 2020-02-10: 2000 mg via TOPICAL

## 2020-02-10 NOTE — Progress Notes (Signed)
Photodynamic Therapy Procedure Note Diagnosis: Actinic keratosis Location: Scalp and Forehead Informed Consent: Discussed risks (burning, pain, redness, peeling, severe sunburn-like reaction, blistering, discoloration, lack of resolution) and benefits of the procedure, as well as the alternatives. Informed consent was obtained. Preparation: After cleansing the skin, the area to be treated was coated with Levulan.  This was allowed to sit on the skin for 90 minutes. Procedure Details: The patient was placed under the light source with appropriate eye protection for 16.42 minutes. After completing the treatment, the patient applied sunscreen to the treated areas. Patient tolerated the procedure well Plan: Avoid any sun exposure for the next 24 hours. Wear sunscreen daily for the next week. Observe normal sun precautions thereafter. Recommend OTC analgesia as needed for pain. Follow-up in 10 weeks.

## 2020-02-10 NOTE — Patient Instructions (Signed)
  Levulan/PDT Treatment Common Side Effects  - Burning/stinging, which may be severe and last up to 24-72 hours after your treatment  - Redness, swelling and/or peeling which may last up to 4 weeks  - Scaling/crusting which may last up to 4-6 weeks  - Sun sensitivity  Care Instructions  - Okay to wash with soap and water and shampoo as normal  - If needed, you can do a cold compress (ex. Ice packs) for comfort  - If okay with your Primary Doctor, you may use analgesics such as Tylenol every 4-6 hours, not to exceed recommended dose  - You may apply Cerave Healing Ointment, Vaseline or Aquaphor  - If you have a lot of swelling you may take a Benadryl to help with this  Sun Precautions  - Avoid all sun exposure for 2 days  - Wear a wide brim hat for the next week if outside  - Wear a sunblock with titanium dioxide greater than 5% daily   We will recheck you in 10-12 weeks. If any problems, please call the office and ask to speak with a nurse.

## 2020-02-16 ENCOUNTER — Ambulatory Visit (INDEPENDENT_AMBULATORY_CARE_PROVIDER_SITE_OTHER): Payer: Medicare Other | Admitting: Podiatry

## 2020-02-16 ENCOUNTER — Other Ambulatory Visit: Payer: Self-pay

## 2020-02-16 DIAGNOSIS — M79676 Pain in unspecified toe(s): Secondary | ICD-10-CM

## 2020-02-16 DIAGNOSIS — B351 Tinea unguium: Secondary | ICD-10-CM

## 2020-02-17 ENCOUNTER — Ambulatory Visit: Payer: Medicare Other

## 2020-02-17 DIAGNOSIS — R293 Abnormal posture: Secondary | ICD-10-CM | POA: Diagnosis not present

## 2020-02-17 DIAGNOSIS — R2689 Other abnormalities of gait and mobility: Secondary | ICD-10-CM

## 2020-02-17 DIAGNOSIS — Z9181 History of falling: Secondary | ICD-10-CM

## 2020-02-17 DIAGNOSIS — M6281 Muscle weakness (generalized): Secondary | ICD-10-CM | POA: Diagnosis not present

## 2020-02-17 DIAGNOSIS — R2681 Unsteadiness on feet: Secondary | ICD-10-CM

## 2020-02-17 NOTE — Therapy (Signed)
[facility] [address] [place], [place], [address] Phone: [phone]   Fax:  [phone]  Physical Therapy Treatment  Patient Details  Name: Cole Martinez MRN: [id number] Date of Birth: Oct 28, 1945 Referring Provider (PT): Referred by: Delrae Rend, MD. PCP is Josetta Huddle, MD   Encounter Date: 02/17/2020   PT End of Session - 02/17/20 1106    Visit Number 19    Number of Visits 21    Date for PT Re-Evaluation 03/16/20   POC for 4 weeks, Cert for 60 days   Authorization Type Medicare (20th visit PN)    Progress Note Due on Visit 20    PT Start Time 1104   pt arriving late   PT Stop Time 1144    PT Time Calculation (min) 40 min    Equipment Utilized During Treatment Gait belt    Activity Tolerance Patient tolerated treatment well    Behavior During Therapy [facility] for tasks assessed/performed           Past Medical History:  Diagnosis Date  . ADHD (attention deficit hyperactivity disorder)   . B12 deficiency   . BPH (benign prostatic hypertrophy)   . 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 Coronary artery disease CARDIOLOGIST- DR Daneen Schick   S/P STENTING LAD 1999 // Myoview 01/2019: EF 62, normal perfusion; Low Risk  . Crohn's disease of ileum ([place]) SINCE 1988  . ED (erectile dysfunction)   . Elevated PSA   . Essential and other specified forms of tremor 11/25/2012  . H/O adenomatous polyp of colon   . Hyperlipidemia   . Nocturia   . OA (osteoarthritis)   . Peripheral neuropathy    hx of, none current as of 08-04-13  . Peyronie disease   . S/P coronary artery stent placement OCT 1999 OF LAD  . Tremor, hereditary, benign MILD RIGHT HAND    Past Surgical History:  Procedure Laterality Date  . CARDIOVASCULAR STRESS TEST  11-01-2010 DR Daneen Schick   NORMAL PERFUSION STUDY/ EF 64%/ NO ISCHEMIA  .  cataract surgery  Bilateral feburary 2020   with lens placement   . COLONOSCOPY WITH PROPOFOL N/A 09/24/2012   Procedure: COLONOSCOPY WITH PROPOFOL;  Surgeon: [name], MD;  [facility];  Service: Endoscopy;  Laterality: N/A;  . CORONARY ANGIOPLASTY WITH STENT PLACEMENT  OCT 1999   STENT OF LAD  . CORONARY STENT INTERVENTION N/A 02/14/2019   Procedure: CORONARY STENT INTERVENTION;  Surgeon: Belva Crome, MD;  Location: [place] [facility];  Service: Cardiovascular;  Laterality: N/A;  . CYSTOSCOPY WITH URETHRAL DILATATION  03/12/2017   Procedure: CYSTOSCOPY WITH URETHRAL DILATATION;  Surgeon: Gaynelle Arabian, MD;  [facility];  Service: Orthopedics;;  Ammie Dalton, Resident Assisting  . CYSTOSCOPY WITH URETHRAL DILATATION N/A 10/03/2018   Procedure: CYSTOSCOPY WITH URETHRAL BALLOON DILATATION WITH BILATERAL RETROGRADE PYELOGRAPHY;  Surgeon: Raynelle Bring, MD;  [facility];  Service: Urology;  Laterality: N/A;  . LAPAROSCOPIC INGUINAL HERNIA REPAIR Bilateral 01-10-2004   W/ MESH  . LEFT HEART CATH AND CORONARY ANGIOGRAPHY N/A 02/13/2019   Procedure: LEFT HEART CATH AND CORONARY ANGIOGRAPHY;  Surgeon: Belva Crome, MD;  Location: [place] [facility];  Service: Cardiovascular;  Laterality: N/A;  . neck benign removed from neck  yrs ago  . PROSTATE BIOPSY N/A 07/12/2012   Procedure: PROSTATE BIOPSY  AND ULTRASOUND;  Surgeon: Ailene Rud, MD;  [facility]  Service: Urology;  Laterality: N/A;  . PROSTATE SURGERY  2002   tuna  . REMOVAL LEFT NECK LYMPH NODE  1996  . TONSILLECTOMY  CHILD  . TOTAL KNEE ARTHROPLASTY Right 03/12/2017   Procedure: RIGHT TOTAL KNEE ARTHROPLASTY;  Surgeon: Gaynelle Arabian, MD;  [facility];  Service: Orthopedics;  Laterality: Right;  Adductor Block  . TRANSURETHRAL RESECTION OF PROSTATE N/A 08/08/2013   Procedure: TRANSURETHRAL RESECTION OF THE PROSTATE WITH GYRUS INSTRUMENTS;  Surgeon: Ailene Rud, MD;   [facility];  Service: Urology;  Laterality: N/A;  . TRANSURETHRAL RESECTION OF PROSTATE      There were no vitals filed for this visit.   Subjective Assessment - 02/17/20 1108    Subjective No new changes/complaints since last visit, reports balance has been fair. No falls.    Pertinent History ADHD. B12 Deficiency, BPH, Chronic Lymphocystic Leukemia, Crohn's Disease, CAD, Benign Tremor, Hyperlipidemia, Osteoporosis    Limitations Walking    Patient Stated Goals Be able to feel secure    Currently in Pain? No/denies    Pain Onset More than a month ago                 [facility] Adult PT Treatment/Exercise - 02/17/20 0001      Ambulation/Gait   Ambulation/Gait Yes    Ambulation/Gait Assistance 5: Supervision    Ambulation/Gait Assistance Details throughout therapy gym with activities    Assistive device None    Gait Pattern Step-through pattern;Narrow base of support    Ambulation Surface Level;Indoor            Neuro re-ed: sensory organization test performed with following results: Conditions: 1: above age related norms 2: above age related norms 3: 1st and 2nd trial below age related norm, 67rd above age related norm  4: above age related norms 5:all marked as fall 6: all marked as fall Composite score: 18 Sensory Analysis Som: above age related norms Vis: above age related norms Vest: below age related norm (decrease to prior assessment, 5%)  Pref: WNL Strategy analysis: WNL  COG alignment: WNL       PT educating on results of SOT.    Balance Exercises - 02/17/20 0001      Balance Exercises: Standing   Standing Eyes Opened Narrow base of support (BOS);Head turns;Foam/compliant surface;Limitations    Standing Eyes Opened Limitations completed with narrow BOS and horizontal/vertical head turns x 10 reps each. progressed to wide BOS and completion of horizontal/vertical with vision removed. increased balance challenge    Standing Eyes Closed Wide  (BOA);Foam/compliant surface;4 reps;30 secs;Limitations    Standing Eyes Closed Time completed approx 25 -30 secs vision removed. increased sway, intermittent UE support required    Tandem Stance Eyes open;Eyes closed;Intermittent upper extremity support;2 reps;30 secs;Limitations    Tandem Stance Time completed initially with eyes open, progressed to vision removed.               PT Short Term Goals - 01/16/20 1256      PT SHORT TERM GOAL #1   Title = LTG             PT Long Term Goals - 01/16/20 1256      PT LONG TERM GOAL #1   Title Patient will be independent with final HEP for balance/strengthening/posture (ALL LTGs Due: 02/13/20)    Baseline continue to progress HEP    Time  4    Period Weeks    Status Revised    Target Date 02/13/20      PT LONG TERM GOAL #2   Title Patient will improve FGA to >/= 25/30 to demonstrate improved balance and reduced risk for falls    Baseline 17/30, 22/30    Time 4    Period Weeks    Status Revised      PT LONG TERM GOAL #3   Title Patient will be able to hold situation 4 of M-CTSIB for >/= 15 seconds to demo improved vestibular input for balance.    Baseline 5-6 secs    Time 4    Period Weeks    Status New      PT LONG TERM GOAL #4   Title Patient will demonstrate improvement of vestibular input >/= 25% on Sensory Organization Test to improve balance    Baseline 15%    Time 4    Period Weeks    Status New                 Plan - 02/17/20 1332    Clinical Impression Statement Today's skilled PT session included beginning to assess progress toward LTG. Completed SOT today during session, with patient demonstrating decrease in vestibular input towards balance despite PT services. Rest of session spent continuing to focus on corner balance exercises. Will continue to progress toward goals. Plan to D/C at Thursdays visit as patient is going to be further evaluated by ENT.    Personal Factors and Comorbidities Comorbidity  3+    Comorbidities ADHD. B12 Deficiency, BPH, Chronic Lymphocystic Leukemia, Crohn's Disease, CAD, Benign Tremor, Hyperlipidemia, Osteoperosis    Examination-Activity Limitations Stairs;Stand;Locomotion Level    Examination-Participation Restrictions Community Activity;Yard Work;Occupation    Stability/Clinical Decision Making Stable/Uncomplicated    Rehab Potential Good    PT Frequency 2x / week    PT Duration 4 weeks    PT Treatment/Interventions ADLs/Self Care Home Management;Cryotherapy;Moist Heat;DME Instruction;Stair training;Functional mobility training;Therapeutic activities;Gait training;Therapeutic exercise;Balance training;Neuromuscular re-education;Patient/family education;Orthotic Fit/Training;Manual techniques;Passive range of motion;Vestibular    PT Next Visit Plan finish checking LTG + Discharge    Consulted and Agree with Plan of Care Patient           Patient will benefit from skilled therapeutic intervention in order to improve the following deficits and impairments:  Abnormal gait,Decreased balance,Difficulty walking,Postural dysfunction,Pain,Decreased strength,Decreased activity tolerance,Improper body mechanics  Visit Diagnosis: Other abnormalities of gait and mobility  Unsteadiness on feet  Muscle weakness (generalized)  Abnormal posture  History of falling     Problem List Patient Active Problem List   Diagnosis Date Noted  . Angina pectoris ([place]) 02/13/2019  . Pseudophakia of both eyes 08/01/2018  . History of total knee arthroplasty 03/29/2017  . Stiffness of right knee 03/16/2017  . OA (osteoarthritis) of knee 03/12/2017  . Tremor, essential 12/03/2015  . Essential hypertension 12/09/2014  . Coronary artery disease involving native heart 12/08/2013  . 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[place]) 12/08/2013  . Hyperlipidemia 12/08/2013  . Benign prostatic hypertrophy 08/08/2013  . Essential and other specified forms of tremor 11/25/2012    Jones Bales, PT, DPT 02/17/2020, 1:34 PM  [facility] [address] [place], [place], [address] Phone: [phone]   Fax:  [phone]  Name: Cole Martinez MRN: [id number] Date of Birth: [birth year]

## 2020-02-19 ENCOUNTER — Other Ambulatory Visit: Payer: Self-pay

## 2020-02-19 ENCOUNTER — Ambulatory Visit: Payer: Medicare Other

## 2020-02-19 DIAGNOSIS — Z9181 History of falling: Secondary | ICD-10-CM

## 2020-02-19 DIAGNOSIS — R2681 Unsteadiness on feet: Secondary | ICD-10-CM | POA: Diagnosis not present

## 2020-02-19 DIAGNOSIS — M6281 Muscle weakness (generalized): Secondary | ICD-10-CM | POA: Diagnosis not present

## 2020-02-19 DIAGNOSIS — R2689 Other abnormalities of gait and mobility: Secondary | ICD-10-CM | POA: Diagnosis not present

## 2020-02-19 DIAGNOSIS — R293 Abnormal posture: Secondary | ICD-10-CM | POA: Diagnosis not present

## 2020-02-19 NOTE — Therapy (Signed)
[facility] [address] [place], [place], [address] Phone: [phone]   Fax:  [phone]  Physical Therapy Treatment/Progress Note/Discharge Summary  Patient Details  Name: Cole Martinez MRN: [id number] Date of Birth: 12/28/1945 Referring Provider (PT): Referred by: Delrae Rend, MD. PCP is Josetta Huddle, MD   PHYSICAL THERAPY DISCHARGE SUMMARY  Visits from Start of Care: 20  Current functional level related to goals / functional outcomes: See Clinical Impression Statement for Details   Remaining deficits: Medium Fall Risk   Education / Equipment: Educated on HEP  Plan: Patient agrees to discharge.  Patient goals were partially met. Patient is being discharged due to being pleased with the current functional level.  ?????          Encounter Date: 02/19/2020   PT End of Session - 02/19/20 1057    Visit Number 20    Number of Visits 21    Date for PT Re-Evaluation 03/16/20   POC for 4 weeks, Cert for 60 days   Authorization Type Medicare (20th visit PN)    Progress Note Due on Visit 20    PT Start Time 1015    PT Stop Time 1056    PT Time Calculation (min) 41 min    Equipment Utilized During Treatment Gait belt    Activity Tolerance Patient tolerated treatment well    Behavior During Therapy WFL for tasks assessed/performed           Past Medical History:  Diagnosis Date  . ADHD (attention deficit hyperactivity disorder)   . B12 deficiency   . BPH (benign prostatic hypertrophy)   . 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 Coronary artery disease CARDIOLOGIST- DR Daneen Schick   S/P STENTING LAD 1999 // Myoview 01/2019: EF 62, normal perfusion; Low Risk  . Crohn's disease of ileum ([place]) SINCE 1988  . ED (erectile dysfunction)   . Elevated PSA   . Essential and other specified forms of  tremor 11/25/2012  . H/O adenomatous polyp of colon   . Hyperlipidemia   . Nocturia   . OA (osteoarthritis)   . Peripheral neuropathy    hx of, none current as of 08-04-13  . Peyronie disease   . S/P coronary artery stent placement OCT 1999 OF LAD  . Tremor, hereditary, benign MILD RIGHT HAND    Past Surgical History:  Procedure Laterality Date  . CARDIOVASCULAR STRESS TEST  11-01-2010 DR Daneen Schick   NORMAL PERFUSION STUDY/ EF 64%/ NO ISCHEMIA  . cataract surgery  Bilateral feburary 2020   with lens placement   . COLONOSCOPY WITH PROPOFOL N/A 09/24/2012   Procedure: COLONOSCOPY WITH PROPOFOL;  Surgeon: [name], MD;  [facility];  Service: Endoscopy;  Laterality: N/A;  . CORONARY ANGIOPLASTY WITH STENT PLACEMENT  OCT 1999   STENT OF LAD  . CORONARY STENT INTERVENTION N/A 02/14/2019   Procedure: CORONARY STENT INTERVENTION;  Surgeon: Belva Crome, MD;  Location: [place] [facility];  Service: Cardiovascular;  Laterality: N/A;  . CYSTOSCOPY WITH URETHRAL DILATATION  03/12/2017   Procedure: CYSTOSCOPY WITH URETHRAL DILATATION;  Surgeon: Gaynelle Arabian, MD;  [facility];  Service: Orthopedics;;  Ammie Dalton, Resident Assisting  . CYSTOSCOPY WITH URETHRAL DILATATION N/A 10/03/2018   Procedure: CYSTOSCOPY WITH URETHRAL BALLOON DILATATION WITH BILATERAL RETROGRADE PYELOGRAPHY;  Surgeon: Raynelle Bring, MD;  [facility];  Service: Urology;  Laterality: N/A;  . LAPAROSCOPIC INGUINAL HERNIA REPAIR Bilateral 01-10-2004   W/ MESH  . LEFT HEART CATH AND CORONARY ANGIOGRAPHY N/A 02/13/2019   Procedure: LEFT HEART CATH AND CORONARY ANGIOGRAPHY;  Surgeon: Belva Crome, MD;  Location: [facility];  Service: Cardiovascular;  Laterality: N/A;  . neck benign removed from neck  yrs ago  . PROSTATE BIOPSY N/A 07/12/2012   Procedure: PROSTATE BIOPSY AND ULTRASOUND;  Surgeon: Ailene Rud, MD;  Location: [facility];  Service: Urology;  Laterality: N/A;   . PROSTATE SURGERY  2002   tuna  . REMOVAL LEFT NECK LYMPH NODE  1996  . TONSILLECTOMY  CHILD  . TOTAL KNEE ARTHROPLASTY Right 03/12/2017   Procedure: RIGHT TOTAL KNEE ARTHROPLASTY;  Surgeon: Gaynelle Arabian, MD;  [facility];  Service: Orthopedics;  Laterality: Right;  Adductor Block  . TRANSURETHRAL RESECTION OF PROSTATE N/A 08/08/2013   Procedure: TRANSURETHRAL RESECTION OF THE PROSTATE WITH GYRUS INSTRUMENTS;  Surgeon: Ailene Rud, MD;  [facility];  Service: Urology;  Laterality: N/A;  . TRANSURETHRAL RESECTION OF PROSTATE      There were no vitals filed for this visit.   Subjective Assessment - 02/19/20 1019    Subjective Continues to feel like eyes closed with balance is hardest. Patient reports reached out to PCP to get referral to ENT. No falls, reports he did lose his balance when he turned quickly but able to catch himself.    Pertinent History ADHD. B12 Deficiency, BPH, Chronic Lymphocystic Leukemia, Crohn's Disease, CAD, Benign Tremor, Hyperlipidemia, Osteoporosis    Limitations Walking    Patient Stated Goals Be able to feel secure    Currently in Pain? No/denies    Pain Onset More than a month ago              [facility] PT Assessment - 02/19/20 1021      Functional Gait  Assessment   Gait assessed  Yes    Gait Level Surface Walks 20 ft in less than 7 sec but greater than 5.5 sec, uses assistive device, slower speed, mild gait deviations, or deviates 6-10 in outside of the 12 in walkway width.    Change in Gait Speed Able to smoothly change walking speed without loss of balance or gait deviation. Deviate no more than 6 in outside of the 12 in walkway width.    Gait with Horizontal Head Turns Performs head turns smoothly with slight change in gait velocity (eg, minor disruption to smooth gait path), deviates 6-10 in outside 12 in walkway width, or uses an assistive device.    Gait with Vertical Head Turns Performs task with slight change in gait velocity (eg,  minor disruption to smooth gait path), deviates 6 - 10 in outside 12 in walkway width or uses assistive device    Gait and Pivot Turn Pivot turns safely within 3 sec and stops quickly with no loss of balance.    Step Over Obstacle Is able to step over 2 stacked shoe boxes taped together (9 in total height) without changing gait speed. No evidence of imbalance.    Gait with Narrow Base of Support Ambulates 7-9 steps.    Gait with Eyes Closed Walks 20 ft, uses assistive device, slower speed, mild gait deviations, deviates 6-10 in outside 12 in walkway width. Ambulates 20 ft in less than 9 sec but greater than 7 sec.    Ambulating Backwards Walks 20 ft, uses assistive device, slower  speed, mild gait deviations, deviates 6-10 in outside 12 in walkway width.    Steps Alternating feet, no rail.    Total Score 24    FGA comment: 24/30 = Medium Fall Risk             [facility] Adult PT Treatment/Exercise - 02/19/20 0001      Ambulation/Gait   Ambulation/Gait Yes    Ambulation/Gait Assistance 5: Supervision    Ambulation/Gait Assistance Details throughout therapy session    Assistive device None    Gait Pattern Step-through pattern;Narrow base of support    Ambulation Surface Level;Indoor      Neuro Re-ed    Neuro Re-ed Details  Completed M-CTSIB, able to complete situation 1-3 for full 30 seconds, situation 4 on average 9 seconds with 3 trials.      Exercises   Exercises Other Exercises    Other Exercises  Completed entire review of current HEP            Access Code: [email or web address] URL: [email or web address] Date: 02/19/2020 Prepared by: Baldomero Lamy  Exercises Romberg Stance with Eyes Closed - 1 x daily - 5 x weekly - 1 sets - 3 reps - 30 hold Tandem Stance - 1 x daily - 5 x weekly - 1 sets - 6 reps - 30 hold Single Leg Stance with Support - 1 x daily - 5 x weekly - 1 sets - 3 reps - 5-15 secs hold Tandem Walking with Counter Support - 1 x daily - 5 x weekly - 3  sets Scapular Retraction with Resistance - 1 x daily - 5 x weekly - 2 sets - 10 reps - 3 hold Doorway Pec Stretch at 60 Elevation - 1 x daily - 5 x weekly - 1 sets - 3 reps - 30 hold Supine Chin Tuck - 1 x daily - 5 x weekly - 2 sets - 10 reps - 3 hold Standing Shoulder Horizontal Abduction with Resistance - 1 x daily - 5 x weekly - 2 sets - 10 reps - 2-3 hold Runner's Step Up/Down - 1 x daily - 5 x weekly - 2 sets - 10 reps Romberg Stance with Head Nods with Eyes Closed - 1 x daily - 7 x weekly - 2 sets - 10 reps Romberg Stance with Head Rotation and Eyes Closed - 1 x daily - 7 x weekly - 2 sets - 10 reps      PT Short Term Goals - 01/16/20 1256      PT SHORT TERM GOAL #1   Title = LTG             PT Long Term Goals - 02/19/20 1030      PT LONG TERM GOAL #1   Title Patient will be independent with final HEP for balance/strengthening/posture (ALL LTGs Due: 02/13/20)    Baseline reports independence with HEP    Time 4    Period Weeks    Status Achieved      PT LONG TERM GOAL #2   Title Patient will improve FGA to >/= 25/30 to demonstrate improved balance and reduced risk for falls    Baseline 17/30, 22/30; 24/30 on 12/16    Time 4    Period Weeks    Status Not Met      PT LONG TERM GOAL #3   Title Patient will be able to hold situation 4 of M-CTSIB for >/= 15 seconds to demo improved vestibular input for balance.    Baseline  5-6 secs; 9 secs    Time 4    Period Weeks    Status Not Met      PT LONG TERM GOAL #4   Title Patient will demonstrate improvement of vestibular input >/= 25% on Sensory Organization Test to improve balance    Baseline 15%; 5% on 12/14    Time 4    Period Weeks    Status Not Met             Plan - 02/19/20 1101    Clinical Impression Statement Today's skilled assessment including finishing assesment of patient's progress toward LTG. Patient has made progress toward LTG, including 24/30 on FGA today, still demonstrating medium fall risk.  Patient continues to demon increased balance challenge with eyes closed on complaint surfaces due to decreased vestibular input. Reviewed current HEP with patient, and educated on compliance upon discharge. Patient is planning to be further evaluated by ENT upon discharge. PT educating on discharge at this time. Patient has made slow steady progress with PT services.    Personal Factors and Comorbidities Comorbidity 3+    Comorbidities ADHD. B12 Deficiency, BPH, Chronic Lymphocystic Leukemia, Crohn's Disease, CAD, Benign Tremor, Hyperlipidemia, Osteoperosis    Examination-Activity Limitations Stairs;Stand;Locomotion Level    Examination-Participation Restrictions Community Activity;Yard Work;Occupation    Stability/Clinical Decision Making Stable/Uncomplicated    Rehab Potential Good    PT Frequency 2x / week    PT Duration 4 weeks    PT Treatment/Interventions ADLs/Self Care Home Management;Cryotherapy;Moist Heat;DME Instruction;Stair training;Functional mobility training;Therapeutic activities;Gait training;Therapeutic exercise;Balance training;Neuromuscular re-education;Patient/family education;Orthotic Fit/Training;Manual techniques;Passive range of motion;Vestibular    PT Next Visit Plan --    Consulted and Agree with Plan of Care Patient           Patient will benefit from skilled therapeutic intervention in order to improve the following deficits and impairments:  Abnormal gait,Decreased balance,Difficulty walking,Postural dysfunction,Pain,Decreased strength,Decreased activity tolerance,Improper body mechanics  Visit Diagnosis: Other abnormalities of gait and mobility  Unsteadiness on feet  Muscle weakness (generalized)  Abnormal posture  History of falling     Problem List Patient Active Problem List   Diagnosis Date Noted  . Angina pectoris ([place]) 02/13/2019  . Pseudophakia of both eyes 08/01/2018  . History of total knee arthroplasty 03/29/2017  . Stiffness of right  knee 03/16/2017  . OA (osteoarthritis) of knee 03/12/2017  . Tremor, essential 12/03/2015  . Essential hypertension 12/09/2014  . Coronary artery disease involving native heart 12/08/2013  . 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[place]) 12/08/2013  . Hyperlipidemia 12/08/2013  . Benign prostatic hypertrophy 08/08/2013  . Essential and other specified forms of tremor 11/25/2012    Jones Bales, PT, DPT 02/19/2020, 11:12 AM  [facility] [address] [place], [place], [address] Phone: [phone]   Fax:  [phone]  Name: Cole Martinez MRN: [id number] Date of Birth: [birth year]

## 2020-02-19 NOTE — Patient Instructions (Signed)
Access Code: [email or web address] URL: [email or web address] Date: 02/19/2020 Prepared by: Baldomero Lamy  Exercises Romberg Stance with Eyes Closed - 1 x daily - 5 x weekly - 1 sets - 3 reps - 30 hold Tandem Stance - 1 x daily - 5 x weekly - 1 sets - 6 reps - 30 hold Single Leg Stance with Support - 1 x daily - 5 x weekly - 1 sets - 3 reps - 5-15 secs hold Tandem Walking with Counter Support - 1 x daily - 5 x weekly - 3 sets Scapular Retraction with Resistance - 1 x daily - 5 x weekly - 2 sets - 10 reps - 3 hold Doorway Pec Stretch at 60 Elevation - 1 x daily - 5 x weekly - 1 sets - 3 reps - 30 hold Supine Chin Tuck - 1 x daily - 5 x weekly - 2 sets - 10 reps - 3 hold Standing Shoulder Horizontal Abduction with Resistance - 1 x daily - 5 x weekly - 2 sets - 10 reps - 2-3 hold Runner's Step Up/Down - 1 x daily - 5 x weekly - 2 sets - 10 reps Romberg Stance with Head Nods with Eyes Closed - 1 x daily - 7 x weekly - 2 sets - 10 reps Romberg Stance with Head Rotation and Eyes Closed - 1 x daily - 7 x weekly - 2 sets - 10 reps

## 2020-02-21 ENCOUNTER — Encounter: Payer: Self-pay | Admitting: Podiatry

## 2020-02-21 NOTE — Progress Notes (Signed)
Subjective:  Patient ID: Cole Martinez, male    DOB: February 24, 1946,  MRN: [id number]  74 y.o. male presents with painful callus(es) b/l and painful thick toenails that are difficult to trim. Painful toenails interfere with ambulation. Aggravating factors include wearing enclosed shoe gear. Pain is relieved with periodic professional debridement. Painful calluses are aggravated when weightbearing with and without shoegear. Pain is relieved with periodic professional debridement.   He relates no new pedal problems on today's visit.  Review of Systems: Negative except as noted in the HPI.  Past Medical History:  Diagnosis Date  . ADHD (attention deficit hyperactivity disorder)   . B12 deficiency   . BPH (benign prostatic hypertrophy)   . 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 Coronary artery disease CARDIOLOGIST- DR Daneen Schick   S/P STENTING LAD 1999 // Myoview 01/2019: EF 62, normal perfusion; Low Risk  . Crohn's disease of ileum ([place]) SINCE 1988  . ED (erectile dysfunction)   . Elevated PSA   . Essential and other specified forms of tremor 11/25/2012  . H/O adenomatous polyp of colon   . Hyperlipidemia   . Nocturia   . OA (osteoarthritis)   . Peripheral neuropathy    hx of, none current as of 08-04-13  . Peyronie disease   . S/P coronary artery stent placement OCT 1999 OF LAD  . Tremor, hereditary, benign MILD RIGHT HAND   Past Surgical History:  Procedure Laterality Date  . CARDIOVASCULAR STRESS TEST  11-01-2010 DR Daneen Schick   NORMAL PERFUSION STUDY/ EF 64%/ NO ISCHEMIA  . cataract surgery  Bilateral feburary 2020   with lens placement   . COLONOSCOPY WITH PROPOFOL N/A 09/24/2012   Procedure: COLONOSCOPY WITH PROPOFOL;  Surgeon: [name], MD;  [facility];  Service: Endoscopy;  Laterality: N/A;  . CORONARY ANGIOPLASTY WITH STENT PLACEMENT  OCT 1999    STENT OF LAD  . CORONARY STENT INTERVENTION N/A 02/14/2019   Procedure: CORONARY STENT INTERVENTION;  Surgeon: Belva Crome, MD;  Location: [place] [facility];  Service: Cardiovascular;  Laterality: N/A;  . CYSTOSCOPY WITH URETHRAL DILATATION  03/12/2017   Procedure: CYSTOSCOPY WITH URETHRAL DILATATION;  Surgeon: Gaynelle Arabian, MD;  [facility];  Service: Orthopedics;;  Ammie Dalton, Resident Assisting  . CYSTOSCOPY WITH URETHRAL DILATATION N/A 10/03/2018   Procedure: CYSTOSCOPY WITH URETHRAL BALLOON DILATATION WITH BILATERAL RETROGRADE PYELOGRAPHY;  Surgeon: Raynelle Bring, MD;  [facility];  Service: Urology;  Laterality: N/A;  . LAPAROSCOPIC INGUINAL HERNIA REPAIR Bilateral 01-10-2004   W/ MESH  . LEFT HEART CATH AND CORONARY ANGIOGRAPHY N/A 02/13/2019   Procedure: LEFT HEART CATH AND CORONARY ANGIOGRAPHY;  Surgeon: Belva Crome, MD;  Location: [place] [facility];  Service: Cardiovascular;  Laterality: N/A;  . neck benign removed from neck  yrs ago  . PROSTATE BIOPSY N/A 07/12/2012   Procedure: PROSTATE BIOPSY AND ULTRASOUND;  Surgeon: Ailene Rud, MD;  Location: [facility];  Service: Urology;  Laterality: N/A;  . PROSTATE SURGERY  2002   tuna  . REMOVAL LEFT NECK LYMPH NODE  1996  . TONSILLECTOMY  CHILD  . TOTAL KNEE ARTHROPLASTY Right 03/12/2017   Procedure: RIGHT TOTAL KNEE ARTHROPLASTY;  Surgeon: Gaynelle Arabian, MD;  [facility];  Service: Orthopedics;  Laterality: Right;  Adductor Block  . TRANSURETHRAL RESECTION OF PROSTATE N/A 08/08/2013   Procedure: TRANSURETHRAL  RESECTION OF THE PROSTATE WITH GYRUS INSTRUMENTS;  Surgeon: Ailene Rud, MD;  [facility];  Service: Urology;  Laterality: N/A;  . TRANSURETHRAL RESECTION OF PROSTATE     Patient Active Problem List   Diagnosis Date Noted  . Angina pectoris ([place]) 02/13/2019  . Pseudophakia of both eyes 08/01/2018  . History of total knee arthroplasty 03/29/2017  . Stiffness of right knee  03/16/2017  . OA (osteoarthritis) of knee 03/12/2017  . Tremor, essential 12/03/2015  . Essential hypertension 12/09/2014  . Coronary artery disease involving native heart 12/08/2013  . 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[place]) 12/08/2013  . Hyperlipidemia 12/08/2013  . Benign prostatic hypertrophy 08/08/2013  . Essential and other specified forms of tremor 11/25/2012    Current Outpatient Medications:  .  acetaminophen (TYLENOL) 500 MG tablet, Take 1,000 mg by mouth at bedtime as needed for mild pain (joint pain). , Disp: , Rfl:  .  amLODipine (NORVASC) 2.5 MG tablet, TAKE 1 TABLET BY MOUTH  DAILY, Disp: 90 tablet, Rfl: 3 .  amphetamine-dextroamphetamine (ADDERALL) 5 MG tablet, Take 5 mg by mouth daily. , Disp: , Rfl:  .  atorvastatin (LIPITOR) 20 MG tablet, TAKE 1 TABLET BY MOUTH IN  THE MORNING, Disp: 90 tablet, Rfl: 3 .  calcium citrate (CALCITRATE - DOSED IN MG ELEMENTAL CALCIUM) 950 (200 Ca) MG tablet, Take 200 mg of elemental calcium by mouth daily., Disp: , Rfl:  .  Cholecalciferol (VITAMIN D3) 50 MCG (2000 UT) capsule, Take 2,000 Units by mouth daily. , Disp: , Rfl:  .  clopidogrel (PLAVIX) 75 MG tablet, TAKE 1 TABLET BY MOUTH  DAILY, Disp: 90 tablet, Rfl: 3 .  Coenzyme Q10 (CO Q-10) 200 MG CAPS, Take 200 mg by mouth daily. , Disp: , Rfl:  .  Cyanocobalamin (VITAMIN B-12) 2500 MCG SUBL, Place 2,500 mcg under the tongue daily., Disp: , Rfl:  .  metoprolol succinate (TOPROL-XL) 25 MG 24 hr tablet, TAKE 1 TABLET BY MOUTH  DAILY, Disp: 90 tablet, Rfl: 3 .  pantoprazole (PROTONIX) 40 MG tablet, TAKE 1 TABLET BY MOUTH  DAILY, Disp: 90 tablet, Rfl: 3 No Known Allergies Social History   Occupational History  . Not on file  Tobacco Use  . Smoking status: Former Smoker    Packs/day: 1.00    Years: 10.00    Pack years: 10.00    Types: Cigarettes    Quit date: 07/09/1984    Years since quitting: 35.6  . Smokeless tobacco: Never Used  Vaping Use  . Vaping Use: Never used  Substance and  Sexual Activity  . Alcohol use: Yes    Alcohol/week: 2.0 standard drinks    Types: 2 Standard drinks or equivalent per week    Comment: daily 1 per day ,   . Drug use: No  . Sexual activity: Not on file    Objective:   Constitutional Pt is a pleasant 74 y.o. Caucasian male in NAD. AAO x 3.   Vascular Capillary refill time to digits immediate b/l. Palpable pedal pulses b/l LE. Pedal hair absent. Lower extremity skin temperature gradient within normal limits. No pain with calf compression b/l. No edema noted b/l lower extremities. No cyanosis or clubbing noted.  Neurologic Normal speech. Oriented to person, place, and time. Protective sensation intact 5/5 intact bilaterally with 10g monofilament b/l. Vibratory sensation intact b/l. Proprioception intact bilaterally.  Dermatologic Pedal skin is thin shiny, atrophic b/l lower extremities. No open wounds bilaterally. No interdigital macerations bilaterally. Toenails 1-5 b/l elongated,  discolored, dystrophic, thickened, crumbly with subungual debris and tenderness to dorsal palpation. Minimal callus formation submet head 2 b/l.  Orthopedic: Normal muscle strength 5/5 to all lower extremity muscle groups bilaterally. No pain crepitus or joint limitation noted with ROM b/l. Hammertoes noted to the 1-5 left.   Radiographs: None Assessment:   1. Pain due to onychomycosis of toenail    Plan:  Patient was evaluated and treated and all questions answered.  Onychomycosis with pain -Nails palliatively debridement as below. -Educated on self-care  Procedure: Nail Debridement Rationale: Pain Type of Debridement: manual, sharp debridement. Instrumentation: Nail nipper, rotary burr. Number of Nails: 10  -Examined patient. -Patient to continue soft, supportive shoe gear daily. -Toenails 1-5 b/l were debrided in length and girth with sterile nail nippers and dremel without iatrogenic bleeding.  -Patient to report any pedal injuries to medical  professional immediately. -Patient/POA to call should there be question/concern in the interim.  Return in about 3 months (around 05/16/2020) for painful mycotic toenails.  Marzetta Board, DPM

## 2020-02-24 ENCOUNTER — Ambulatory Visit: Payer: Medicare Other

## 2020-02-24 DIAGNOSIS — Z961 Presence of intraocular lens: Secondary | ICD-10-CM | POA: Diagnosis not present

## 2020-02-26 ENCOUNTER — Ambulatory Visit: Payer: Medicare Other

## 2020-03-16 DIAGNOSIS — R270 Ataxia, unspecified: Secondary | ICD-10-CM | POA: Insufficient documentation

## 2020-04-05 DIAGNOSIS — I2510 Atherosclerotic heart disease of native coronary artery without angina pectoris: Secondary | ICD-10-CM | POA: Diagnosis not present

## 2020-04-05 DIAGNOSIS — E038 Other specified hypothyroidism: Secondary | ICD-10-CM | POA: Diagnosis not present

## 2020-04-05 DIAGNOSIS — I10 Essential (primary) hypertension: Secondary | ICD-10-CM | POA: Diagnosis not present

## 2020-04-05 DIAGNOSIS — N400 Enlarged prostate without lower urinary tract symptoms: Secondary | ICD-10-CM | POA: Diagnosis not present

## 2020-04-05 DIAGNOSIS — M47816 Spondylosis without myelopathy or radiculopathy, lumbar region: Secondary | ICD-10-CM | POA: Diagnosis not present

## 2020-04-05 DIAGNOSIS — M1990 Unspecified osteoarthritis, unspecified site: Secondary | ICD-10-CM | POA: Diagnosis not present

## 2020-04-05 DIAGNOSIS — M8580 Other specified disorders of bone density and structure, unspecified site: Secondary | ICD-10-CM | POA: Diagnosis not present

## 2020-04-05 DIAGNOSIS — K219 Gastro-esophageal reflux disease without esophagitis: Secondary | ICD-10-CM | POA: Diagnosis not present

## 2020-04-05 DIAGNOSIS — E7800 Pure hypercholesterolemia, unspecified: Secondary | ICD-10-CM | POA: Diagnosis not present

## 2020-04-12 ENCOUNTER — Telehealth: Payer: Self-pay

## 2020-04-12 NOTE — Telephone Encounter (Signed)
Pt calling in to inquire about 2nd booster shot ths nurse explains at present the 2nd booster is offered to transplant patients

## 2020-04-19 ENCOUNTER — Other Ambulatory Visit (HOSPITAL_COMMUNITY): Payer: Self-pay | Admitting: Internal Medicine

## 2020-04-20 ENCOUNTER — Encounter: Payer: Self-pay | Admitting: Neurology

## 2020-04-20 ENCOUNTER — Ambulatory Visit (INDEPENDENT_AMBULATORY_CARE_PROVIDER_SITE_OTHER): Payer: Medicare Other | Admitting: Neurology

## 2020-04-20 VITALS — BP 151/87 | HR 67 | Ht 68.0 in | Wt 179.8 lb

## 2020-04-20 DIAGNOSIS — E538 Deficiency of other specified B group vitamins: Secondary | ICD-10-CM | POA: Diagnosis not present

## 2020-04-20 DIAGNOSIS — G250 Essential tremor: Secondary | ICD-10-CM | POA: Diagnosis not present

## 2020-04-20 DIAGNOSIS — R269 Unspecified abnormalities of gait and mobility: Secondary | ICD-10-CM

## 2020-04-20 NOTE — Progress Notes (Signed)
Reason for visit: Essential tremor, gait disorder  Cole Martinez is an 75 y.o. male  History of present illness:  Mr. Cole Martinez is a 75 year old right-handed white male with a history of an essential tremor.  The patient is still working, he uses a mouse for the computer but has some difficulty with this.  He does have some tremor feeding himself, but he mainly has tremor holding things with his hands.  He may have some resting component to the tremor.  The patient has also noted over the last year that he is having increasing problems with balance.  He is walking with his feet wide apart.  He does report some occasional tingling in the feet and he has developed restless leg syndrome.  He does report some low back pain in the mornings without discomfort radiating down the legs on either side.  He denies any weakness in the legs but feels that his arms are not as strong as they used to be.  He has gone through rehabilitation for his walking without benefit.  He was told that he may have an inner ear problem but was seen by ENT what was felt not to have inner ear issues.  He denies any significant neck pain.  He has fallen on several occasions, twice while walking his dog.  He does not use a cane or a walker for ambulation.  Past Medical History:  Diagnosis Date  . ADHD (attention deficit hyperactivity disorder)   . B12 deficiency   . BPH (benign prostatic hypertrophy)   . 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 Coronary artery disease CARDIOLOGIST- DR Daneen Schick   S/P STENTING LAD 1999 // Myoview 01/2019: EF 62, normal perfusion; Low Risk  . Crohn's disease of ileum ([place]) SINCE 1988  . ED (erectile dysfunction)   . Elevated PSA   . Essential and other specified forms of tremor 11/25/2012  . H/O adenomatous polyp of colon   . Hyperlipidemia   . Nocturia   . OA (osteoarthritis)   .  Peripheral neuropathy    hx of, none current as of 08-04-13  . Peyronie disease   . S/P coronary artery stent placement OCT 1999 OF LAD  . Tremor, hereditary, benign MILD RIGHT HAND    Past Surgical History:  Procedure Laterality Date  . CARDIOVASCULAR STRESS TEST  11-01-2010 DR Daneen Schick   NORMAL PERFUSION STUDY/ EF 64%/ NO ISCHEMIA  . cataract surgery  Bilateral feburary 2020   with lens placement   . COLONOSCOPY WITH PROPOFOL N/A 09/24/2012   Procedure: COLONOSCOPY WITH PROPOFOL;  Surgeon: [name], MD;  [facility];  Service: Endoscopy;  Laterality: N/A;  . CORONARY ANGIOPLASTY WITH STENT PLACEMENT  OCT 1999   STENT OF LAD  . CORONARY STENT INTERVENTION N/A 02/14/2019   Procedure: CORONARY STENT INTERVENTION;  Surgeon: Belva Crome, MD;  Location: [place] [facility];  Service: Cardiovascular;  Laterality: N/A;  . CYSTOSCOPY WITH URETHRAL DILATATION  03/12/2017   Procedure: CYSTOSCOPY WITH URETHRAL DILATATION;  Surgeon: Gaynelle Arabian, MD;  [facility];  Service: Orthopedics;;  Ammie Dalton, Resident Assisting  . CYSTOSCOPY WITH URETHRAL DILATATION N/A 10/03/2018   Procedure: CYSTOSCOPY WITH URETHRAL BALLOON DILATATION WITH BILATERAL RETROGRADE PYELOGRAPHY;  Surgeon: Raynelle Bring, MD;  [facility];  Service: Urology;  Laterality: N/A;  . LAPAROSCOPIC INGUINAL HERNIA REPAIR Bilateral 01-10-2004  W/ MESH  . LEFT HEART CATH AND CORONARY ANGIOGRAPHY N/A 02/13/2019   Procedure: LEFT HEART CATH AND CORONARY ANGIOGRAPHY;  Surgeon: Belva Crome, MD;  Location: [place] [facility];  Service: Cardiovascular;  Laterality: N/A;  . neck benign removed from neck  yrs ago  . PROSTATE BIOPSY N/A 07/12/2012   Procedure: PROSTATE BIOPSY AND ULTRASOUND;  Surgeon: Ailene Rud, MD;  Location: [facility];  Service: Urology;  Laterality: N/A;  . PROSTATE SURGERY  2002   tuna  . REMOVAL LEFT NECK LYMPH NODE  1996  . TONSILLECTOMY  CHILD  . TOTAL KNEE  ARTHROPLASTY Right 03/12/2017   Procedure: RIGHT TOTAL KNEE ARTHROPLASTY;  Surgeon: Gaynelle Arabian, MD;  [facility];  Service: Orthopedics;  Laterality: Right;  Adductor Block  . TRANSURETHRAL RESECTION OF PROSTATE N/A 08/08/2013   Procedure: TRANSURETHRAL RESECTION OF THE PROSTATE WITH GYRUS INSTRUMENTS;  Surgeon: Ailene Rud, MD;  [facility];  Service: Urology;  Laterality: N/A;  . TRANSURETHRAL RESECTION OF PROSTATE      Family History  Problem Relation Age of Onset  . Obesity Brother   . Diabetes Brother   . Parkinsonism Brother     Social history:  reports that he quit smoking about 35 years ago. His smoking use included cigarettes. He has a 10.00 pack-year smoking history. He has never used smokeless tobacco. He reports current alcohol use of about 2.0 standard drinks of alcohol per week. He reports that he does not use drugs.   No Known Allergies  Medications:  Prior to Admission medications   Medication Sig Start Date End Date Taking? Authorizing Provider  acetaminophen (TYLENOL) 500 MG tablet Take 1,000 mg by mouth at bedtime as needed for mild pain (joint pain).    Yes [provider]  amitriptyline (ELAVIL) 10 MG tablet Take 10 mg by mouth at bedtime.   Yes [provider]  amLODipine (NORVASC) 2.5 MG tablet TAKE 1 TABLET BY MOUTH  DAILY 12/22/19  Yes Belva Crome, MD  amphetamine-dextroamphetamine (ADDERALL) 5 MG tablet Take 5 mg by mouth daily.    Yes [provider]  atorvastatin (LIPITOR) 20 MG tablet TAKE 1 TABLET BY MOUTH IN  THE MORNING 04/14/19  Yes Belva Crome, MD  calcium citrate (CALCITRATE - DOSED IN MG ELEMENTAL CALCIUM) 950 (200 Ca) MG tablet Take 200 mg of elemental calcium by mouth daily.   Yes [provider]  Cholecalciferol (VITAMIN D3) 50 MCG (2000 UT) capsule Take 2,000 Units by mouth daily.    Yes [provider]  clopidogrel (PLAVIX) 75 MG tablet TAKE 1 TABLET BY MOUTH  DAILY 01/16/20  Yes  Belva Crome, MD  Coenzyme Q10 (CO Q-10) 200 MG CAPS Take 200 mg by mouth daily.    Yes [provider]  Cyanocobalamin (VITAMIN B-12) 2500 MCG SUBL Place 2,500 mcg under the tongue daily.   Yes [provider]  metoprolol succinate (TOPROL-XL) 25 MG 24 hr tablet TAKE 1 TABLET BY MOUTH  DAILY 01/16/20  Yes Belva Crome, MD  pantoprazole (PROTONIX) 40 MG tablet TAKE 1 TABLET BY MOUTH  DAILY 01/07/20  Yes Belva Crome, MD    ROS:  Out of a complete 14 system review of symptoms, the patient complains only of the following symptoms, and all other reviewed systems are negative.  Walking difficulty Tremor  Blood pressure (!) 151/87, pulse 67, height 5' 8"  (1.727 m), weight 179 lb 12.8 oz (81.6 kg).  Physical Exam  General: The patient is alert and cooperative at the time of the examination.  Skin: No significant peripheral edema is noted.   Neurologic Exam  Mental status: The patient is alert and oriented x 3 at the time of the examination. The patient has apparent normal recent and remote memory, with an apparently normal attention span and concentration ability.   Cranial nerves: Facial symmetry is present. Speech is normal, no aphasia or dysarthria is noted. Extraocular movements are full. Visual fields are full.  Motor: The patient has good strength in all 4 extremities.  Sensory examination: Soft touch sensation is symmetric on the face, arms, and legs.  The patient does have a stocking pattern pinprick sensory deficit one half way up the legs bilaterally.  Position sense and vibration sensation are present in both feet, vibration sensation may be slightly depressed.  Coordination: The patient has good finger-nose-finger and heel-to-shin bilaterally.  Gait and station: The patient has a wide-based gait, he can walk independently.  Tandem gait is unsteady.  Romberg is also unsteady, the patient does not fall.  Reflexes: Deep tendon reflexes are symmetric,  but are depressed.   Assessment/Plan:  1.  Essential tremor  2.  Gait disorder  The patient has developed a gait issue, this can be associated with the essential tremor syndrome.  The patient however could potentially have an underlying peripheral neuropathy, he walks with his feet fairly wide apart.  We will set him up for blood work today.  He will come back for nerve conductions on both legs and EMG on 1 leg.  He will otherwise follow-up in 6 months.  Jill Alexanders MD 04/20/2020 9:29 AM  [facility] [address] [place], [place] [address]  Phone [phone] Fax [phone]

## 2020-04-22 LAB — MULTIPLE MYELOMA PANEL, SERUM
Albumin SerPl Elph-Mcnc: 4.3 g/dL (ref 2.9–4.4)
Albumin/Glob SerPl: 1.6 (ref 0.7–1.7)
Alpha 1: 0.2 g/dL (ref 0.0–0.4)
Alpha2 Glob SerPl Elph-Mcnc: 0.8 g/dL (ref 0.4–1.0)
B-Globulin SerPl Elph-Mcnc: 1 g/dL (ref 0.7–1.3)
Gamma Glob SerPl Elph-Mcnc: 0.7 g/dL (ref 0.4–1.8)
Globulin, Total: 2.8 g/dL (ref 2.2–3.9)
IgA/Immunoglobulin A, Serum: 87 mg/dL (ref 61–437)
IgG (Immunoglobin G), Serum: 820 mg/dL (ref 603–1613)
IgM (Immunoglobulin M), Srm: 12 mg/dL — ABNORMAL LOW (ref 15–143)
Total Protein: 7.1 g/dL (ref 6.0–8.5)

## 2020-04-22 LAB — HEPATITIS C ANTIBODY: Hep C Virus Ab: <0.1 s/co ratio (ref 0.0–0.9)

## 2020-04-22 LAB — COPPER, SERUM: Copper: 118 ug/dL (ref 69–132)

## 2020-04-22 LAB — VITAMIN B12: Vitamin B-12: 962 pg/mL (ref 232–1245)

## 2020-04-22 LAB — SEDIMENTATION RATE: Sed Rate: 2 mm/hr (ref 0–30)

## 2020-04-22 LAB — ANGIOTENSIN CONVERTING ENZYME: Angio Convert Enzyme: 46 U/L (ref 14–82)

## 2020-04-22 LAB — B. BURGDORFI ANTIBODIES: Lyme IgG/IgM Ab: <0.91 {ISR} (ref 0.00–0.90)

## 2020-04-22 LAB — ANA W/REFLEX: Anti Nuclear Antibody (ANA): NEGATIVE

## 2020-04-27 ENCOUNTER — Ambulatory Visit (INDEPENDENT_AMBULATORY_CARE_PROVIDER_SITE_OTHER): Payer: Medicare Other | Admitting: Dermatology

## 2020-04-27 ENCOUNTER — Other Ambulatory Visit: Payer: Self-pay

## 2020-04-27 ENCOUNTER — Encounter: Payer: Self-pay | Admitting: Dermatology

## 2020-04-27 DIAGNOSIS — L570 Actinic keratosis: Secondary | ICD-10-CM

## 2020-04-27 DIAGNOSIS — L821 Other seborrheic keratosis: Secondary | ICD-10-CM | POA: Diagnosis not present

## 2020-05-03 DIAGNOSIS — K219 Gastro-esophageal reflux disease without esophagitis: Secondary | ICD-10-CM | POA: Diagnosis not present

## 2020-05-03 DIAGNOSIS — I10 Essential (primary) hypertension: Secondary | ICD-10-CM | POA: Diagnosis not present

## 2020-05-03 DIAGNOSIS — R7309 Other abnormal glucose: Secondary | ICD-10-CM | POA: Diagnosis not present

## 2020-05-03 DIAGNOSIS — M109 Gout, unspecified: Secondary | ICD-10-CM | POA: Diagnosis not present

## 2020-05-03 DIAGNOSIS — R350 Frequency of micturition: Secondary | ICD-10-CM | POA: Diagnosis not present

## 2020-05-06 ENCOUNTER — Encounter: Payer: Self-pay | Admitting: Dermatology

## 2020-05-06 NOTE — Progress Notes (Signed)
   Follow-Up Visit   Subjective  Cole Martinez is a 75 y.o. male who presents for the following: Follow-up (PDT follow up. Patient wants to make sure it worked. No new concerns.).  Actinic keratoses Location: Face Duration:  Quality:  Associated Signs/Symptoms: Modifying Factors: PDT Severity:  Timing: Context:   Objective  Well appearing patient in no apparent distress; mood and affect are within normal limits. Objective  Head - Anterior (Face): PDT follow-up:  70% overall improvement.  No residual lesions requiring biopsy or freezing.  Objective  Left Temporal Scalp (2): Brown flattopped keratotic papules outer eye and left temple.    A focused examination was performed including Head and neck.. Relevant physical exam findings are noted in the Assessment and Plan.   Assessment & Plan    AK (actinic keratosis) Head - Anterior (Face)  Recheck 1 year.  Seborrheic keratosis (2) Left Temporal Scalp  Leave if stable.      I, Lavonna Monarch, MD, have reviewed all documentation for this visit.  The documentation on 05/06/20 for the exam, diagnosis, procedures, and orders are all accurate and complete.

## 2020-05-17 ENCOUNTER — Encounter: Payer: Self-pay | Admitting: Neurology

## 2020-05-17 ENCOUNTER — Ambulatory Visit (INDEPENDENT_AMBULATORY_CARE_PROVIDER_SITE_OTHER): Payer: Medicare Other | Admitting: Neurology

## 2020-05-17 ENCOUNTER — Other Ambulatory Visit: Payer: Self-pay | Admitting: Interventional Cardiology

## 2020-05-17 ENCOUNTER — Other Ambulatory Visit (HOSPITAL_COMMUNITY): Payer: Self-pay | Admitting: Internal Medicine

## 2020-05-17 ENCOUNTER — Encounter: Payer: Self-pay | Admitting: Oncology

## 2020-05-17 DIAGNOSIS — R350 Frequency of micturition: Secondary | ICD-10-CM | POA: Diagnosis not present

## 2020-05-17 DIAGNOSIS — R202 Paresthesia of skin: Secondary | ICD-10-CM

## 2020-05-17 DIAGNOSIS — F900 Attention-deficit hyperactivity disorder, predominantly inattentive type: Secondary | ICD-10-CM | POA: Diagnosis not present

## 2020-05-17 DIAGNOSIS — R2689 Other abnormalities of gait and mobility: Secondary | ICD-10-CM

## 2020-05-17 DIAGNOSIS — K219 Gastro-esophageal reflux disease without esophagitis: Secondary | ICD-10-CM | POA: Diagnosis not present

## 2020-05-17 DIAGNOSIS — R7303 Prediabetes: Secondary | ICD-10-CM | POA: Diagnosis not present

## 2020-05-17 DIAGNOSIS — R269 Unspecified abnormalities of gait and mobility: Secondary | ICD-10-CM

## 2020-05-17 DIAGNOSIS — I10 Essential (primary) hypertension: Secondary | ICD-10-CM | POA: Diagnosis not present

## 2020-05-17 DIAGNOSIS — M109 Gout, unspecified: Secondary | ICD-10-CM | POA: Diagnosis not present

## 2020-05-17 HISTORY — DX: Unspecified abnormalities of gait and mobility: R26.9

## 2020-05-17 NOTE — Procedures (Signed)
     HISTORY:  Cole Martinez is a 75 year old gentleman with a history of an essential tremor.  The patient has developed a significant gait disorder with wide-based gait, tingling in the feet.  He does report some low back pain without radiation down the legs.  He is being evaluated for a possible peripheral neuropathy.  NERVE CONDUCTION STUDIES:  Nerve conduction studies were performed on both lower extremities. The distal motor latencies and motor amplitudes for the peroneal and posterior tibial nerves were within normal limits. The nerve conduction velocities for these nerves were also normal. The sensory latencies for the peroneal and sural nerves were within normal limits. The F wave latencies for the posterior tibial nerves were prolonged bilaterally.   EMG STUDIES:  EMG study was performed on the right lower extremity:  The tibialis anterior muscle reveals 2 to 4K motor units with full recruitment. No fibrillations or positive waves were seen. The peroneus tertius muscle reveals 2 to 4K motor units with full recruitment. No fibrillations or positive waves were seen. The medial gastrocnemius muscle reveals 1 to 3K motor units with full recruitment. No fibrillations or positive waves were seen. The vastus lateralis muscle reveals 2 to 4K motor units with full recruitment. No fibrillations or positive waves were seen. The iliopsoas muscle reveals 2 to 4K motor units with full recruitment. No fibrillations or positive waves were seen. The biceps femoris muscle (long head) reveals 2 to 4K motor units with full recruitment. No fibrillations or positive waves were seen. The lumbosacral paraspinal muscles were tested at 3 levels, and revealed no abnormalities of insertional activity at all 3 levels tested. There was good relaxation.   IMPRESSION:  Nerve conduction studies done on both lower extremities were relatively unremarkable, without evidence of a peripheral neuropathy.  EMG  evaluation of the right lower extremity was unremarkable, no evidence of an overlying lumbar radiculopathy was seen.  Jill Alexanders MD 05/17/2020 1:59 PM  [facility] [address] [place], [place] [address]  Phone [phone] Fax [phone]

## 2020-05-17 NOTE — Progress Notes (Addendum)
The patient comes in for EMG and nerve conduction study evaluation today.  Nerve conduction studies of the lower extremities were unremarkable, no evidence of peripheral neuropathy was seen.  EMG of the right leg was unremarkable, without evidence of a lumbar radiculopathy.  The patient reports ongoing tingling in the feet, he does have some low back pain that is mainly in the morning.  He reports that he saw Dr. Sherwood Gambler in the past for spinal stenosis but never had surgery.  This was many years ago apparently.  We may consider MRI of the lumbar spine, the patient will call me when he wishes to have the study done.     [place]    Nerve / Sites Muscle Latency Ref. Amplitude Ref. Rel Amp Segments Distance Velocity Ref. Area    ms ms mV mV %  cm m/s m/s mVms  L Peroneal - EDB     Ankle EDB 3.3 ?6.5 3.2 ?2.0 100 Ankle - EDB 9   8.0     Fib head EDB 9.7  2.2  67.9 Fib head - Ankle 29 45 ?44 6.5     Pop fossa EDB 12.0  2.5  115 Pop fossa - Fib head 10 44 ?44 7.4         Pop fossa - Ankle      R Peroneal - EDB     Ankle EDB 4.7 ?6.5 2.2 ?2.0 100 Ankle - EDB 9   7.0     Fib head EDB 11.5  1.8  83.1 Fib head - Ankle 30 44 ?44 6.0     Pop fossa EDB 13.8  1.7  90.6 Pop fossa - Fib head 10 44 ?44 6.1         Pop fossa - Ankle      L Tibial - AH     Ankle AH 4.1 ?5.8 4.0 ?4.0 100 Ankle - AH 9   11.8     Pop fossa AH 13.5  2.2  55.2 Pop fossa - Ankle 39 41 ?41 9.9  R Tibial - AH     Ankle AH 3.5 ?5.8 4.5 ?4.0 100 Ankle - AH 9   18.5     Pop fossa AH 13.2  3.0  65.7 Pop fossa - Ankle 40 41 ?41 16.1             SNC    Nerve / Sites Rec. Site Peak Lat Ref.  Amp Ref. Segments Distance    ms ms V V  cm  L Sural - Ankle (Calf)     Calf Ankle 4.2 ?4.4 8 ?6 Calf - Ankle 14  R Sural - Ankle (Calf)     Calf Ankle 4.0 ?4.4 8 ?6 Calf - Ankle 14  L Superficial peroneal - Ankle     Lat leg Ankle 4.0 ?4.4 6 ?6 Lat leg - Ankle 14  R Superficial peroneal - Ankle     Lat leg Ankle 4.1 ?4.4 7 ?6 Lat leg -  Ankle 14             F  Wave    Nerve F Lat Ref.   ms ms  L Tibial - AH 65.5 ?56.0  R Tibial - AH 63.3 ?56.0

## 2020-05-17 NOTE — Progress Notes (Signed)
Please refer to EMG and nerve conduction procedure note.  

## 2020-05-20 ENCOUNTER — Inpatient Hospital Stay: Payer: Medicare Other | Attending: Oncology

## 2020-05-20 ENCOUNTER — Other Ambulatory Visit: Payer: Self-pay

## 2020-05-20 DIAGNOSIS — Z23 Encounter for immunization: Secondary | ICD-10-CM | POA: Diagnosis not present

## 2020-06-04 ENCOUNTER — Ambulatory Visit (INDEPENDENT_AMBULATORY_CARE_PROVIDER_SITE_OTHER): Payer: Medicare Other | Admitting: Podiatry

## 2020-06-04 ENCOUNTER — Other Ambulatory Visit: Payer: Self-pay

## 2020-06-04 DIAGNOSIS — Z86010 Personal history of colonic polyps: Secondary | ICD-10-CM | POA: Insufficient documentation

## 2020-06-04 DIAGNOSIS — M79676 Pain in unspecified toe(s): Secondary | ICD-10-CM | POA: Diagnosis not present

## 2020-06-04 DIAGNOSIS — E559 Vitamin D deficiency, unspecified: Secondary | ICD-10-CM | POA: Insufficient documentation

## 2020-06-04 DIAGNOSIS — M792 Neuralgia and neuritis, unspecified: Secondary | ICD-10-CM | POA: Insufficient documentation

## 2020-06-04 DIAGNOSIS — M799 Soft tissue disorder, unspecified: Secondary | ICD-10-CM | POA: Insufficient documentation

## 2020-06-04 DIAGNOSIS — E538 Deficiency of other specified B group vitamins: Secondary | ICD-10-CM | POA: Diagnosis not present

## 2020-06-04 DIAGNOSIS — F988 Other specified behavioral and emotional disorders with onset usually occurring in childhood and adolescence: Secondary | ICD-10-CM | POA: Insufficient documentation

## 2020-06-04 DIAGNOSIS — E230 Hypopituitarism: Secondary | ICD-10-CM | POA: Insufficient documentation

## 2020-06-04 DIAGNOSIS — L84 Corns and callosities: Secondary | ICD-10-CM

## 2020-06-04 DIAGNOSIS — G629 Polyneuropathy, unspecified: Secondary | ICD-10-CM

## 2020-06-04 DIAGNOSIS — E291 Testicular hypofunction: Secondary | ICD-10-CM | POA: Insufficient documentation

## 2020-06-04 DIAGNOSIS — E669 Obesity, unspecified: Secondary | ICD-10-CM | POA: Insufficient documentation

## 2020-06-04 DIAGNOSIS — R296 Repeated falls: Secondary | ICD-10-CM | POA: Insufficient documentation

## 2020-06-04 DIAGNOSIS — E039 Hypothyroidism, unspecified: Secondary | ICD-10-CM | POA: Insufficient documentation

## 2020-06-04 DIAGNOSIS — E7800 Pure hypercholesterolemia, unspecified: Secondary | ICD-10-CM | POA: Insufficient documentation

## 2020-06-04 DIAGNOSIS — M8580 Other specified disorders of bone density and structure, unspecified site: Secondary | ICD-10-CM | POA: Insufficient documentation

## 2020-06-04 DIAGNOSIS — R351 Nocturia: Secondary | ICD-10-CM | POA: Insufficient documentation

## 2020-06-04 DIAGNOSIS — M47816 Spondylosis without myelopathy or radiculopathy, lumbar region: Secondary | ICD-10-CM | POA: Insufficient documentation

## 2020-06-04 DIAGNOSIS — M40204 Unspecified kyphosis, thoracic region: Secondary | ICD-10-CM | POA: Insufficient documentation

## 2020-06-04 DIAGNOSIS — K5000 Crohn's disease of small intestine without complications: Secondary | ICD-10-CM | POA: Insufficient documentation

## 2020-06-04 DIAGNOSIS — B351 Tinea unguium: Secondary | ICD-10-CM

## 2020-06-04 DIAGNOSIS — R350 Frequency of micturition: Secondary | ICD-10-CM | POA: Insufficient documentation

## 2020-06-04 DIAGNOSIS — K219 Gastro-esophageal reflux disease without esophagitis: Secondary | ICD-10-CM | POA: Insufficient documentation

## 2020-06-04 DIAGNOSIS — C9110 Chronic lymphocytic leukemia of B-cell type not having achieved remission: Secondary | ICD-10-CM | POA: Insufficient documentation

## 2020-06-04 DIAGNOSIS — H900 Conductive hearing loss, bilateral: Secondary | ICD-10-CM | POA: Insufficient documentation

## 2020-06-04 DIAGNOSIS — N529 Male erectile dysfunction, unspecified: Secondary | ICD-10-CM | POA: Insufficient documentation

## 2020-06-04 DIAGNOSIS — M109 Gout, unspecified: Secondary | ICD-10-CM | POA: Insufficient documentation

## 2020-06-04 DIAGNOSIS — M791 Myalgia, unspecified site: Secondary | ICD-10-CM | POA: Insufficient documentation

## 2020-06-04 NOTE — Progress Notes (Signed)
  Subjective:  Patient ID: Cole Martinez, male    DOB: 1945/09/20,  MRN: [id number]  75 y.o. male presents with h/o B-12 deficiency with h/o neuropathy with painful callus(es) b/l and painful thick toenails that are difficult to trim. Painful toenails interfere with ambulation. Aggravating factors include wearing enclosed shoe gear. Pain is relieved with periodic professional debridement. Painful calluses are aggravated when weightbearing with and without shoegear. Pain is relieved with periodic professional debridement.   He relates pain under callus of left foot.   PCP is Dr. Josetta Huddle. Last visit was March, 2022.  Review of Systems: Negative except as noted in the HPI.   No Known Allergies    Objective:   Constitutional Pt is a pleasant 75 y.o. Caucasian male in NAD. AAO x 3.   Vascular Capillary refill time to digits immediate b/l. Palpable pedal pulses b/l LE. Pedal hair absent. Lower extremity skin temperature gradient within normal limits. No pain with calf compression b/l. No edema noted b/l lower extremities. No cyanosis or clubbing noted.  Neurologic Normal speech. Oriented to person, place, and time. Protective sensation intact 5/5 intact bilaterally with 10g monofilament b/l. Vibratory sensation intact b/l. Proprioception intact bilaterally.  Dermatologic Pedal skin is thin shiny, atrophic b/l lower extremities. No open wounds bilaterally. No interdigital macerations bilaterally. Toenails 1-5 b/l elongated, discolored, dystrophic, thickened, crumbly with subungual debris and tenderness to dorsal palpation. Hyperkeratotic lesion(s) submet head 2 left foot.  No erythema, no edema, no drainage, no fluctuance.  Orthopedic: Normal muscle strength 5/5 to all lower extremity muscle groups bilaterally. No pain crepitus or joint limitation noted with ROM b/l. Hammertoes noted to the 1-5 left.   Radiographs: None Assessment:   1. Pain due to onychomycosis of toenail   2. Callus   3.  Vitamin B12 deficiency   4. Neuropathy    Plan:  Patient was evaluated and treated and all questions answered.  Onychomycosis with pain -Nails palliatively debridement as below. -Educated on self-care  Procedure: Nail Debridement Rationale: Pain Type of Debridement: manual, sharp debridement. Instrumentation: Nail nipper, rotary burr. Number of Nails: 10  -Examined patient. -Patient to continue soft, supportive shoe gear daily. -Toenails 1-5 b/l were debrided in length and girth with sterile nail nippers and dremel without iatrogenic bleeding.  -Callus(es) submet head 2 left foot pared utilizing sterile scalpel blade without complication or incident. Total number debrided =1. -Patient to report any pedal injuries to medical professional immediately. -Recommended Skechers shoes with stretchable uppers and memory foam insoles which may be purchased at [employer], [place], [place], [place], [place] or at [email or web address]. -Patient/POA to call should there be question/concern in the interim.  Return in about 3 months (around 09/03/2020).  Marzetta Board, DPM

## 2020-06-04 NOTE — Patient Instructions (Signed)
Recommend Skechers Loafers with stretchable uppers and memory foam insoles. They can be purchased at [employer], [employer] or [place]. Also on [email or web address].  

## 2020-06-07 ENCOUNTER — Ambulatory Visit: Payer: Medicare Other | Admitting: Podiatry

## 2020-06-10 ENCOUNTER — Encounter: Payer: Self-pay | Admitting: Podiatry

## 2020-06-14 ENCOUNTER — Inpatient Hospital Stay: Payer: Medicare Other | Admitting: Oncology

## 2020-06-15 DIAGNOSIS — M47816 Spondylosis without myelopathy or radiculopathy, lumbar region: Secondary | ICD-10-CM | POA: Diagnosis not present

## 2020-06-15 DIAGNOSIS — M1990 Unspecified osteoarthritis, unspecified site: Secondary | ICD-10-CM | POA: Diagnosis not present

## 2020-06-15 DIAGNOSIS — I2510 Atherosclerotic heart disease of native coronary artery without angina pectoris: Secondary | ICD-10-CM | POA: Diagnosis not present

## 2020-06-15 DIAGNOSIS — N400 Enlarged prostate without lower urinary tract symptoms: Secondary | ICD-10-CM | POA: Diagnosis not present

## 2020-06-15 DIAGNOSIS — I10 Essential (primary) hypertension: Secondary | ICD-10-CM | POA: Diagnosis not present

## 2020-06-15 DIAGNOSIS — E7800 Pure hypercholesterolemia, unspecified: Secondary | ICD-10-CM | POA: Diagnosis not present

## 2020-06-15 DIAGNOSIS — E038 Other specified hypothyroidism: Secondary | ICD-10-CM | POA: Diagnosis not present

## 2020-06-15 DIAGNOSIS — K219 Gastro-esophageal reflux disease without esophagitis: Secondary | ICD-10-CM | POA: Diagnosis not present

## 2020-06-28 DIAGNOSIS — U071 COVID-19: Secondary | ICD-10-CM | POA: Diagnosis not present

## 2020-06-30 ENCOUNTER — Other Ambulatory Visit (HOSPITAL_COMMUNITY): Payer: Self-pay

## 2020-06-30 MED ORDER — PAXLOVID 20 X 150 MG & 10 X 100MG PO TBPK
ORAL_TABLET | ORAL | 0 refills | Status: DC
  Filled 2020-06-30: qty 30, 5d supply, fill #0

## 2020-07-01 ENCOUNTER — Other Ambulatory Visit (HOSPITAL_COMMUNITY): Payer: Self-pay

## 2020-07-12 ENCOUNTER — Other Ambulatory Visit (HOSPITAL_COMMUNITY): Payer: Self-pay

## 2020-07-12 MED FILL — Amphetamine-Dextroamphetamine Tab 5 MG: ORAL | 30 days supply | Qty: 60 | Fill #0 | Status: AC

## 2020-07-14 DIAGNOSIS — Z20822 Contact with and (suspected) exposure to covid-19: Secondary | ICD-10-CM | POA: Diagnosis not present

## 2020-07-19 ENCOUNTER — Other Ambulatory Visit (HOSPITAL_COMMUNITY): Payer: Self-pay

## 2020-07-19 MED ORDER — DICLOFENAC SODIUM 1 % EX GEL: CUTANEOUS | 3 refills | Status: DC

## 2020-07-28 DIAGNOSIS — K219 Gastro-esophageal reflux disease without esophagitis: Secondary | ICD-10-CM | POA: Diagnosis not present

## 2020-07-28 DIAGNOSIS — E7800 Pure hypercholesterolemia, unspecified: Secondary | ICD-10-CM | POA: Diagnosis not present

## 2020-07-28 DIAGNOSIS — M8580 Other specified disorders of bone density and structure, unspecified site: Secondary | ICD-10-CM | POA: Diagnosis not present

## 2020-07-28 DIAGNOSIS — I2510 Atherosclerotic heart disease of native coronary artery without angina pectoris: Secondary | ICD-10-CM | POA: Diagnosis not present

## 2020-07-28 DIAGNOSIS — N400 Enlarged prostate without lower urinary tract symptoms: Secondary | ICD-10-CM | POA: Diagnosis not present

## 2020-07-28 DIAGNOSIS — M1990 Unspecified osteoarthritis, unspecified site: Secondary | ICD-10-CM | POA: Diagnosis not present

## 2020-07-28 DIAGNOSIS — M47816 Spondylosis without myelopathy or radiculopathy, lumbar region: Secondary | ICD-10-CM | POA: Diagnosis not present

## 2020-07-28 DIAGNOSIS — E038 Other specified hypothyroidism: Secondary | ICD-10-CM | POA: Diagnosis not present

## 2020-07-28 DIAGNOSIS — I10 Essential (primary) hypertension: Secondary | ICD-10-CM | POA: Diagnosis not present

## 2020-08-20 ENCOUNTER — Telehealth: Payer: Self-pay | Admitting: Neurology

## 2020-08-20 DIAGNOSIS — R202 Paresthesia of skin: Secondary | ICD-10-CM

## 2020-08-20 NOTE — Telephone Encounter (Signed)
Pt called stating that he is ready for the provider to put in the MRI order that is needing to be scheduled for him. Please advise.

## 2020-08-23 NOTE — Addendum Note (Signed)
Addended by: Kathrynn Ducking on: 08/23/2020 04:34 PM   Modules accepted: Orders

## 2020-08-23 NOTE — Telephone Encounter (Signed)
I called the patient.  He is ready to have the MRI of the lumbar spine.  I will get this set up.

## 2020-08-24 ENCOUNTER — Telehealth: Payer: Self-pay | Admitting: Neurology

## 2020-08-24 NOTE — Telephone Encounter (Signed)
MRI lumbar spine wo contrast auth: NPR  Sent to GI for scheduling.

## 2020-09-02 ENCOUNTER — Ambulatory Visit
Admission: RE | Admit: 2020-09-02 | Discharge: 2020-09-02 | Disposition: A | Discharge status: Home / Self Care | Payer: Medicare Other | Source: Ambulatory Visit | Attending: Neurology | Admitting: Neurology

## 2020-09-02 ENCOUNTER — Telehealth: Payer: Self-pay | Admitting: Neurology

## 2020-09-02 ENCOUNTER — Other Ambulatory Visit: Payer: Self-pay

## 2020-09-02 ENCOUNTER — Other Ambulatory Visit (HOSPITAL_COMMUNITY): Payer: Self-pay

## 2020-09-02 DIAGNOSIS — R269 Unspecified abnormalities of gait and mobility: Secondary | ICD-10-CM

## 2020-09-02 DIAGNOSIS — R202 Paresthesia of skin: Secondary | ICD-10-CM

## 2020-09-02 DIAGNOSIS — M48061 Spinal stenosis, lumbar region without neurogenic claudication: Secondary | ICD-10-CM | POA: Diagnosis not present

## 2020-09-02 MED FILL — Amphetamine-Dextroamphetamine Tab 5 MG: ORAL | 30 days supply | Qty: 60 | Fill #0 | Status: AC

## 2020-09-02 NOTE — Telephone Encounter (Signed)
I called the patient.  MRI of the lumbar spine does show a moderate level of L3-4 level, with some scoliosis changes and severe left neuroforaminal stenosis at L5-S1 level.  I am not sure this explains all of his gait problems.  I did indicate that individuals with essential tremors often do report gait instability.  The patient is concerned that he continues to get worse with his balance, he has not benefited from physical therapy, he continues to report some numbness in the feet.  I will do MRI of the cervical spine and brain looking for the etiology of his gait changes.   MRI lumbar 09/02/20:  IMPRESSION: 1. 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 Correlation with radiographs is recommended prior to any operative intervention. 2. Severe left foraminal stenosis at L5-S1. Moderate foraminal stenosis on the right at L1-L2 and L2-L3 and the left at L4-L5. Mild-to-moderate bilateral foraminal stenosis at L3-L4. 3. Moderate canal stenosis at L4-L5 and mild canal stenosis at L3-L4 with mild-to-moderate left subarticular recess stenosis at both levels. Moderate left subarticular recess stenosis at L5-S1. 4. Dextrocurvature centered at L2.

## 2020-09-13 ENCOUNTER — Other Ambulatory Visit (HOSPITAL_COMMUNITY): Payer: Self-pay

## 2020-09-20 ENCOUNTER — Other Ambulatory Visit: Payer: Self-pay

## 2020-09-20 ENCOUNTER — Ambulatory Visit (INDEPENDENT_AMBULATORY_CARE_PROVIDER_SITE_OTHER): Payer: Medicare Other | Admitting: Podiatry

## 2020-09-20 DIAGNOSIS — G629 Polyneuropathy, unspecified: Secondary | ICD-10-CM | POA: Diagnosis not present

## 2020-09-20 DIAGNOSIS — M79676 Pain in unspecified toe(s): Secondary | ICD-10-CM

## 2020-09-20 DIAGNOSIS — B351 Tinea unguium: Secondary | ICD-10-CM | POA: Diagnosis not present

## 2020-09-20 DIAGNOSIS — L84 Corns and callosities: Secondary | ICD-10-CM

## 2020-09-20 DIAGNOSIS — E538 Deficiency of other specified B group vitamins: Secondary | ICD-10-CM

## 2020-09-20 DIAGNOSIS — Q828 Other specified congenital malformations of skin: Secondary | ICD-10-CM | POA: Diagnosis not present

## 2020-09-20 NOTE — Patient Instructions (Signed)
NON-MEDICATED FELT CALLUS PADS OR HORSE-SHOE PADS CAN BE PURCHASED IN BULK AT [email or web address]   REUSABLE NON-MEDICATED U-SHAPED GEL CALLUS CUSHION CAN BE PURCHASED AT [email or web address]

## 2020-09-21 NOTE — Progress Notes (Signed)
Subjective: Cole Martinez is a pleasant 75 y.o. male patient seen today for at risk foot care with h/o Vitamin B-12 deficiency with neuropathy. He is seen for painful plantar calluses b/l. He is also see for painful thick toenails that are difficult to trim. Pain interferes with ambulation. Aggravating factors include wearing enclosed shoe gear. Pain is relieved with periodic professional debridement.  Patient states his left foot callus is most symptomatics today and states he is having problems exercising  PCP is Josetta Huddle, MD. Last visit was: 3 months ago.  No Known Allergies  Objective: Physical Exam  General: ROLLA KEDZIERSKI is a pleasant 75 y.o. Caucasian male, WD, WN in NAD. AAO x 3.   Vascular:  Capillary refill time to digits immediate b/l. Palpable pedal pulses b/l LE. Pedal hair absent. Lower extremity skin temperature gradient within normal limits. No pain with calf compression b/l. No edema noted b/l lower extremities.  Dermatological:  Pedal skin is thin shiny, atrophic b/l lower extremities. No open wounds b/l lower extremities. No interdigital macerations b/l lower extremities. Toenails 1-5 b/l elongated, discolored, dystrophic, thickened, crumbly with subungual debris and tenderness to dorsal palpation. Hyperkeratotic lesion(s) submet head 2 left foot.  No erythema, no edema, no drainage, no fluctuance. Porokeratotic lesion(s) submet head 5 left foot and submet head 5 right foot. No erythema, no edema, no drainage, no fluctuance.  Musculoskeletal:  Normal muscle strength 5/5 to all lower extremity muscle groups bilaterally. No pain crepitus or joint limitation noted with ROM b/l. Hammertoe(s) noted to the 1-5 left.  Neurological:  Pt has subjective symptoms of neuropathy. Protective sensation intact 5/5 intact bilaterally with 10g monofilament b/l. Vibratory sensation intact b/l.  Assessment and Plan:  1. Pain due to onychomycosis of toenail   2. Callus   3.  Porokeratosis   4. Vitamin B12 deficiency   5. Neuropathy      -Examined patient. -Patient to continue soft, supportive shoe gear daily. -Toenails 1-5 b/l were debrided in length and girth with sterile nail nippers and dremel without iatrogenic bleeding.  -Callus(es) submet head 2 left foot pared utilizing sterile scalpel blade without complication or incident. Total number debrided =1. -Painful porokeratotic lesion(s) submet head 5 left foot and submet head 5 right foot pared and enucleated with sterile scalpel blade without incident. Total number of lesions debrided=2. -Patient to report any pedal injuries to medical professional immediately. -Applied non-medicated felt callus pads to submet head 5 lesions. We discussed [employer]orts which could be modified to offload his calluses. He declines on today's visit. Provided him vendor website [email or web address] to purchase non-medicated callus pads in bulk. -Patient/POA to call should there be question/concern in the interim.  Return in about 3 months (around 12/21/2020).  Marzetta Board, DPM

## 2020-09-22 ENCOUNTER — Encounter: Payer: Self-pay | Admitting: Podiatry

## 2020-09-24 ENCOUNTER — Ambulatory Visit
Admission: RE | Admit: 2020-09-24 | Discharge: 2020-09-24 | Disposition: A | Discharge status: Home / Self Care | Payer: Medicare Other | Source: Ambulatory Visit | Attending: Neurology | Admitting: Neurology

## 2020-09-24 ENCOUNTER — Other Ambulatory Visit: Payer: Self-pay

## 2020-09-24 DIAGNOSIS — R269 Unspecified abnormalities of gait and mobility: Secondary | ICD-10-CM | POA: Diagnosis not present

## 2020-09-24 DIAGNOSIS — M4802 Spinal stenosis, cervical region: Secondary | ICD-10-CM | POA: Diagnosis not present

## 2020-09-25 ENCOUNTER — Telehealth: Payer: Self-pay | Admitting: Neurology

## 2020-09-25 NOTE — Telephone Encounter (Signed)
I called the patient.  MRI of the brain has not yet been read out, my reading shows that there is at least a moderate level of cortical atrophy, no significant ventriculomegaly, minimal small vessel disease seen.  No clear explanation for gait instability.  MRI of the cervical spine does show some spinal stenosis at the C4-5 level without injury to the spinal cord, spinal fluid can be seen around the cord.  I do not think that this is a source of his walking issues.  His balance issues may be part of the essential tremor syndrome itself.  I discussed this with the patient.   MRI cervical 09/24/20:  IMPRESSION: 1. Multilevel spondylosis of the cervical spine as described. 2. Mild bilateral foraminal narrowing at C2-3. 3. Severe foraminal narrowing bilaterally at C3-4. 4. Moderate foraminal narrowing bilaterally at C4-5 and C5-6. 5. Moderate right and mild left foraminal narrowing at C6-7. 6. Central canal stenosis is greatest at C4-5 and C5-6 with effacement of the ventral CSF but no abnormal cord signal.

## 2020-09-27 ENCOUNTER — Telehealth: Payer: Self-pay | Admitting: Neurology

## 2020-09-27 NOTE — Telephone Encounter (Signed)
MRI of the brain shows minimal white matter changes, generalized atrophy, no explanation for gait disturbance.   MRI brain 09/24/20:  IMPRESSION: This MRI of the brain without contrast shows the following:  1.   Generalized cortical atrophy, stable compared to the CT scan from 03/20/2017. 2.    Some T2/flair hyperintense foci in the hemispheres consistent with mild chronic microvascular ischemic change. 3.    No acute findings.

## 2020-09-28 DIAGNOSIS — M109 Gout, unspecified: Secondary | ICD-10-CM | POA: Diagnosis not present

## 2020-10-01 DIAGNOSIS — M1990 Unspecified osteoarthritis, unspecified site: Secondary | ICD-10-CM | POA: Diagnosis not present

## 2020-10-01 DIAGNOSIS — E038 Other specified hypothyroidism: Secondary | ICD-10-CM | POA: Diagnosis not present

## 2020-10-01 DIAGNOSIS — M8580 Other specified disorders of bone density and structure, unspecified site: Secondary | ICD-10-CM | POA: Diagnosis not present

## 2020-10-01 DIAGNOSIS — K219 Gastro-esophageal reflux disease without esophagitis: Secondary | ICD-10-CM | POA: Diagnosis not present

## 2020-10-01 DIAGNOSIS — M47816 Spondylosis without myelopathy or radiculopathy, lumbar region: Secondary | ICD-10-CM | POA: Diagnosis not present

## 2020-10-01 DIAGNOSIS — I10 Essential (primary) hypertension: Secondary | ICD-10-CM | POA: Diagnosis not present

## 2020-10-01 DIAGNOSIS — I2510 Atherosclerotic heart disease of native coronary artery without angina pectoris: Secondary | ICD-10-CM | POA: Diagnosis not present

## 2020-10-01 DIAGNOSIS — N400 Enlarged prostate without lower urinary tract symptoms: Secondary | ICD-10-CM | POA: Diagnosis not present

## 2020-10-01 DIAGNOSIS — E7800 Pure hypercholesterolemia, unspecified: Secondary | ICD-10-CM | POA: Diagnosis not present

## 2020-10-03 ENCOUNTER — Other Ambulatory Visit (HOSPITAL_COMMUNITY): Payer: Self-pay

## 2020-10-18 ENCOUNTER — Ambulatory Visit: Payer: Medicare Other | Admitting: Neurology

## 2020-10-25 ENCOUNTER — Ambulatory Visit (INDEPENDENT_AMBULATORY_CARE_PROVIDER_SITE_OTHER): Payer: Medicare Other | Admitting: Neurology

## 2020-10-25 ENCOUNTER — Encounter: Payer: Self-pay | Admitting: Neurology

## 2020-10-25 ENCOUNTER — Other Ambulatory Visit (HOSPITAL_COMMUNITY): Payer: Self-pay

## 2020-10-25 VITALS — BP 122/84 | HR 66 | Ht 68.0 in | Wt 181.1 lb

## 2020-10-25 DIAGNOSIS — R269 Unspecified abnormalities of gait and mobility: Secondary | ICD-10-CM | POA: Diagnosis not present

## 2020-10-25 MED ORDER — PRIMIDONE 50 MG PO TABS
50.0000 mg | ORAL_TABLET | Freq: Every day | ORAL | 3 refills | Status: DC
  Filled 2020-10-25: qty 30, 30d supply, fill #0
  Filled 2020-12-07: qty 30, 30d supply, fill #1
  Filled 2021-01-13: qty 30, 30d supply, fill #2
  Filled 2021-02-17: qty 30, 30d supply, fill #3

## 2020-10-25 NOTE — Progress Notes (Signed)
Reason for visit: Essential tremor, gait disorder  Cole Martinez is an 75 y.o. male  History of present illness:  Cole. Martinez is a 75 year old right-handed white male with a history of essential tremor.  The patient has both resting and action components of the tremor that has been present for a number years.  He reports a gradually worsening gait problem, he has not had any recent falls.  He did have physical therapy almost a year ago which was helpful.  The patient reports that the resting component of the tremor is worse on the left than the right.  He denies any tremor affecting the head or neck or speech.  He reports that his brother was given a diagnosis of Parkinson's disease.  He also reports some troubles with memory that has been present for about 6 months.  He recently had MRI of the brain that did show some generalized atrophy.  He reports trouble with handwriting and with using a mouse.  He can do better using the mouse with the left hand than the right.  He is still working, but he is considering retiring in the near future.  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[place]) [place]   ED (erectile dysfunction)    Elevated PSA    Essential and other specified forms of tremor 11/25/2012   Gait abnormality 05/17/2020   H/O adenomatous polyp of colon    Hyperlipidemia    Nocturia    OA (osteoarthritis)    Peripheral neuropathy    hx of, none current as of 08-04-13   Peyronie disease    S/P coronary artery stent placement OCT 1999 OF LAD   Tremor, hereditary, benign MILD  RIGHT HAND    Past Surgical History:  Procedure Laterality Date   CARDIOVASCULAR STRESS TEST  11-01-2010 DR Daneen Schick   NORMAL PERFUSION STUDY/ EF 64%/ NO ISCHEMIA   cataract surgery  Bilateral feburary 2020   with lens placement    COLONOSCOPY WITH PROPOFOL N/A 09/24/2012   Procedure: COLONOSCOPY WITH PROPOFOL;  Surgeon: [name], MD;  Location: [facility];  Service: Endoscopy;  Laterality: N/A;   CORONARY ANGIOPLASTY WITH STENT PLACEMENT  OCT 1999   STENT OF LAD   CORONARY STENT INTERVENTION N/A 02/14/2019   Procedure: CORONARY STENT INTERVENTION;  Surgeon: Belva Crome, MD;  Location: [place] [facility];  Service: Cardiovascular;  Laterality: N/A;   CYSTOSCOPY WITH URETHRAL DILATATION  03/12/2017   Procedure: CYSTOSCOPY WITH URETHRAL DILATATION;  Surgeon: Gaynelle Arabian, MD;  Location: [facility];  Service: Orthopedics;;  Ammie Dalton, Resident Assisting   CYSTOSCOPY WITH URETHRAL DILATATION N/A 10/03/2018   Procedure: CYSTOSCOPY WITH URETHRAL BALLOON DILATATION WITH BILATERAL RETROGRADE PYELOGRAPHY;  Surgeon: Raynelle Bring, MD;  Location: [facility];  Service: Urology;  Laterality: N/A;   LAPAROSCOPIC INGUINAL HERNIA REPAIR Bilateral 01-10-2004   W/ MESH   LEFT HEART CATH AND CORONARY ANGIOGRAPHY N/A 02/13/2019   Procedure: LEFT HEART CATH AND CORONARY ANGIOGRAPHY;  Surgeon: Belva Crome, MD;  Location: [place] [facility];  Service: Cardiovascular;  Laterality: N/A;   neck benign removed from neck  yrs ago   PROSTATE BIOPSY N/A 07/12/2012   Procedure: PROSTATE BIOPSY AND ULTRASOUND;  Surgeon: Ailene Rud, MD;  [facility];  Service: Urology;  Laterality: N/A;   PROSTATE SURGERY  2002   tuna   REMOVAL LEFT NECK LYMPH NODE  1996   TONSILLECTOMY  CHILD   TOTAL KNEE ARTHROPLASTY Right 03/12/2017   Procedure: RIGHT TOTAL KNEE ARTHROPLASTY;  Surgeon: Gaynelle Arabian, MD;  Location: [facility];  Service: Orthopedics;  Laterality: Right;  Adductor Block    TRANSURETHRAL RESECTION OF PROSTATE N/A 08/08/2013   Procedure: TRANSURETHRAL RESECTION OF THE PROSTATE WITH GYRUS INSTRUMENTS;  Surgeon: Ailene Rud, MD;  Location: [facility];  Service: Urology;  Laterality: N/A;   TRANSURETHRAL RESECTION OF PROSTATE      Family History  Problem Relation Age of Onset   Obesity Brother    Diabetes Brother    Parkinsonism Brother     Social history:  reports that he quit smoking about 36 years ago. His smoking use included cigarettes. He has a 10.00 pack-year smoking history. He has never used smokeless tobacco. He reports current alcohol use of about 2.0 standard drinks per week. He reports that he does not use drugs.   No Known Allergies  Medications:  Prior to Admission medications   Medication Sig Start Date End Date Taking? Authorizing Provider  acetaminophen (TYLENOL) 500 MG tablet Take 1,000 mg by mouth at bedtime as needed for mild pain (joint pain).    Yes [provider]  amitriptyline (ELAVIL) 25 MG tablet TAKE 1 TABLET BY MOUTH AT BEDTIME ORALLY ONCE A DAY 30 DAY 04/19/20 04/19/21 Yes Wenda Low, MD  amphetamine-dextroamphetamine (ADDERALL) 5 MG tablet TAKE 1 TABLET BY MOUTH 2 TIMES DAILY (05/17/20) 05/17/20 11/13/20 Yes Josetta Huddle, MD  atorvastatin (LIPITOR) 20 MG tablet TAKE 1 TABLET BY MOUTH IN  THE MORNING 05/18/20  Yes Belva Crome, MD  calcium citrate (CALCITRATE - DOSED IN MG ELEMENTAL CALCIUM) 950 (200 Ca) MG tablet Take 200 mg of elemental calcium by mouth daily.   Yes [provider]  Cholecalciferol 25 MCG (1000 UT) tablet 1 tablet   Yes [provider]  clopidogrel (PLAVIX) 75 MG tablet TAKE 1 TABLET BY MOUTH  DAILY 01/16/20  Yes Belva Crome, MD  Coenzyme Q10 (CO Q-10) 200 MG CAPS Take 200 mg by mouth daily.    Yes [provider]  Cyanocobalamin (VITAMIN B-12) 2500 MCG SUBL Place 2,500 mcg under the tongue daily.   Yes [provider]  diclofenac Sodium (VOLTAREN) 1 % GEL Use  as directed Transdermal three times a day as needed 30 days 07/19/20  Yes   metoprolol succinate (TOPROL-XL) 25 MG 24 hr tablet TAKE 1 TABLET BY MOUTH  DAILY 01/16/20  Yes Belva Crome, MD  pantoprazole (PROTONIX) 40 MG tablet TAKE 1 TABLET BY MOUTH  DAILY 01/07/20  Yes Belva Crome, MD    ROS:  Out of a complete 14 system review of symptoms, the patient complains only of the following symptoms, and all other reviewed systems are negative.  Tremor Walking problem Memory problem  Blood pressure 122/84, pulse 66, height 5' 8"  (1.727 m), weight 181 lb 2 oz (82.2 kg), SpO2 98 %.  Physical Exam  General: The patient is alert and cooperative at the time of the examination.  Skin: No significant peripheral edema is noted.   Neurologic Exam  Mental status: The patient is alert and oriented x 3 at the time of the examination. The patient has apparent normal recent and remote memory, with an apparently normal attention span and concentration ability.   Cranial nerves: Facial symmetry is present. Speech is normal, no aphasia or dysarthria is noted. Extraocular movements are full. Visual fields are full.  Motor: The patient has good strength in all 4 extremities.  Sensory examination: Soft touch sensation is symmetric on the face, arms, and legs.  Coordination: The patient has good finger-nose-finger and heel-to-shin bilaterally.  The patient has a resting component tremors in both upper extremities, possibly worse on the left.  With finger-nose-finger, minimal tremor is noted.  With handwriting, he has slight tremor with drawing a spiral, no significant issues with handwriting.  Gait and station: The patient has a somewhat wide-based gait, with walking, he has bilateral arm swing but the arm swing may be somewhat less on the left than the right, no tremors noted with walking.  Tandem gait is unsteady.  Romberg is negative but is unsteady.  Reflexes: Deep tendon reflexes are  symmetric.   MRI brain 09/26/20:  IMPRESSION: This MRI of the brain without contrast shows the following:  1.   Generalized cortical atrophy, stable compared to the CT scan from 03/20/2017. 2.    Some T2/flair hyperintense foci in the hemispheres consistent with mild chronic microvascular ischemic change. 3.    No acute findings.   MRI cervical 09/24/20:  IMPRESSION: 1. Multilevel spondylosis of the cervical spine as described. 2. Mild bilateral foraminal narrowing at C2-3. 3. Severe foraminal narrowing bilaterally at C3-4. 4. Moderate foraminal narrowing bilaterally at C4-5 and C5-6. 5. Moderate right and mild left foraminal narrowing at C6-7. 6. Central canal stenosis is greatest at C4-5 and C5-6 with effacement of the ventral CSF but no abnormal cord signal.   MRI lumbar 09/02/20:  IMPRESSION: 1. 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 Correlation with radiographs is recommended prior to any operative intervention. 2. Severe left foraminal stenosis at L5-S1. Moderate foraminal stenosis on the right at L1-L2 and L2-L3 and the left at L4-L5. Mild-to-moderate bilateral foraminal stenosis at L3-L4. 3. Moderate canal stenosis at L4-L5 and mild canal stenosis at L3-L4 with mild-to-moderate left subarticular recess stenosis at both levels. Moderate left subarticular recess stenosis at L5-S1. 4. Dextrocurvature centered at L2.   Assessment/Plan:  1.  Tremor, probable essential tremor  2.  Gait disorder  3.  Reported mild memory disorder  The patient will need to be followed for the memory issues.  He has tremors that have resting and action component.  In the future, if there is some concern for Parkinson's disease, a DaTscan can be done.  The patient be set up for physical therapy for gait.  He will be placed on low-dose primidone at night, 50 mg.  He will follow-up here in 4 to 5  months, in the future he can be seen through Dr. Rexene Alberts.  Jill Alexanders MD 10/25/2020 9:41 AM  [facility] [address] [place], [place] [address]  Phone [phone] Fax [phone]

## 2020-10-26 ENCOUNTER — Other Ambulatory Visit (HOSPITAL_COMMUNITY): Payer: Self-pay

## 2020-10-28 ENCOUNTER — Other Ambulatory Visit (HOSPITAL_COMMUNITY): Payer: Self-pay

## 2020-10-29 ENCOUNTER — Other Ambulatory Visit (HOSPITAL_COMMUNITY): Payer: Self-pay

## 2020-10-30 ENCOUNTER — Other Ambulatory Visit: Payer: Self-pay | Admitting: Interventional Cardiology

## 2020-11-01 ENCOUNTER — Other Ambulatory Visit (HOSPITAL_COMMUNITY): Payer: Self-pay

## 2020-11-02 ENCOUNTER — Other Ambulatory Visit (HOSPITAL_COMMUNITY): Payer: Self-pay

## 2020-11-02 MED ORDER — AMPHETAMINE-DEXTROAMPHETAMINE 5 MG PO TABS
ORAL_TABLET | ORAL | 0 refills | Status: DC
  Filled 2020-11-02: qty 60, 30d supply, fill #0

## 2020-11-12 DIAGNOSIS — Z23 Encounter for immunization: Secondary | ICD-10-CM | POA: Diagnosis not present

## 2020-11-14 ENCOUNTER — Other Ambulatory Visit: Payer: Self-pay | Admitting: Interventional Cardiology

## 2020-11-15 ENCOUNTER — Other Ambulatory Visit: Payer: Self-pay

## 2020-11-15 ENCOUNTER — Encounter: Payer: Self-pay | Admitting: Physical Therapy

## 2020-11-15 ENCOUNTER — Ambulatory Visit: Payer: Medicare Other | Attending: Neurology | Admitting: Physical Therapy

## 2020-11-15 DIAGNOSIS — Z9181 History of falling: Secondary | ICD-10-CM | POA: Insufficient documentation

## 2020-11-15 DIAGNOSIS — R293 Abnormal posture: Secondary | ICD-10-CM | POA: Insufficient documentation

## 2020-11-15 DIAGNOSIS — R2689 Other abnormalities of gait and mobility: Secondary | ICD-10-CM

## 2020-11-15 DIAGNOSIS — M6281 Muscle weakness (generalized): Secondary | ICD-10-CM | POA: Diagnosis not present

## 2020-11-15 DIAGNOSIS — R2681 Unsteadiness on feet: Secondary | ICD-10-CM | POA: Insufficient documentation

## 2020-11-15 NOTE — Therapy (Signed)
[facility] [address] [place], [place], [address] Phone: [phone]   Fax:  [phone]  Physical Therapy Evaluation  Patient Details  Name: Cole Martinez MRN: [id number] Date of Birth: 1945-08-23 Referring Provider (PT): Margette Fast, MD   Encounter Date: 11/15/2020   PT End of Session - 11/15/20 1109     Visit Number 1    Date for PT Re-Evaluation 01/10/21    Authorization Type Medicare A and B    Authorization Time Period 11/15/20 to 01/10/21    Progress Note Due on Visit 10    PT Start Time 1015    PT Stop Time 1058    PT Time Calculation (min) 43 min    Equipment Utilized During Treatment Gait belt    Activity Tolerance Patient tolerated treatment well    Behavior During Therapy WFL for tasks assessed/performed             Past Medical History:  Diagnosis Date   ADHD (attention deficit hyperactivity disorder)    B12 deficiency    BPH (benign prostatic hypertrophy)    Chronic lymphocytic leukemia (CLL), 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--  ONCOLOGIST-  DR Benay Spice   PT IS ASYMPTOMATIC--- LAST CBC W/ DIFF 06-24-2012 STABLE   Coronary artery disease CARDIOLOGIST- DR Daneen Schick   S/P STENTING LAD 1999 // Myoview 01/2019: EF 62, normal perfusion; Low Risk   Crohn's disease of ileum ([place]) [place]   ED (erectile dysfunction)    Elevated PSA    Essential and other specified forms of tremor 11/25/2012   Gait abnormality 05/17/2020   H/O adenomatous polyp of colon    Hyperlipidemia    Nocturia    OA (osteoarthritis)    Peripheral neuropathy    hx of, none current as of 08-04-13   Peyronie disease    S/P coronary artery stent placement OCT 1999 OF LAD   Tremor, hereditary, benign MILD RIGHT HAND    Past Surgical History:  Procedure Laterality Date   CARDIOVASCULAR STRESS TEST  11-01-2010 DR Daneen Schick   NORMAL PERFUSION STUDY/ EF 64%/ NO ISCHEMIA   cataract surgery  Bilateral feburary 2020   with lens  placement    COLONOSCOPY WITH PROPOFOL N/A 09/24/2012   Procedure: COLONOSCOPY WITH PROPOFOL;  Surgeon: [name], MD;  [facility];  Service: Endoscopy;  Laterality: N/A;   CORONARY ANGIOPLASTY WITH STENT PLACEMENT  OCT 1999   STENT OF LAD   CORONARY STENT INTERVENTION N/A 02/14/2019   Procedure: CORONARY STENT INTERVENTION;  Surgeon: Belva Crome, MD;  Location: [facility];  Service: Cardiovascular;  Laterality: N/A;   CYSTOSCOPY WITH URETHRAL DILATATION  03/12/2017   Procedure: CYSTOSCOPY WITH URETHRAL DILATATION;  Surgeon: Gaynelle Arabian, MD;  [facility]: [facility];  Service: Orthopedics;;  Ammie Dalton, Resident Assisting   CYSTOSCOPY WITH URETHRAL DILATATION N/A 10/03/2018   Procedure: CYSTOSCOPY WITH URETHRAL BALLOON DILATATION WITH BILATERAL RETROGRADE PYELOGRAPHY;  Surgeon: Raynelle Bring, MD;  [facility];  Service: Urology;  Laterality: N/A;   LAPAROSCOPIC INGUINAL HERNIA REPAIR Bilateral 01-10-2004   W/ MESH   LEFT HEART CATH AND CORONARY ANGIOGRAPHY N/A 02/13/2019   Procedure: LEFT HEART CATH AND CORONARY ANGIOGRAPHY;  Surgeon: Belva Crome, MD;  Location: [facility];  Service: Cardiovascular;  Laterality: N/A;   neck benign removed from neck  yrs ago   PROSTATE BIOPSY N/A 07/12/2012   Procedure: PROSTATE BIOPSY AND ULTRASOUND;  Surgeon: Ailene Rud, MD;  Location: [facility];  Service: Urology;  Laterality: N/A;   PROSTATE SURGERY  2002   tuna   REMOVAL LEFT NECK LYMPH NODE  1996   TONSILLECTOMY  CHILD   TOTAL KNEE ARTHROPLASTY Right 03/12/2017   Procedure: RIGHT TOTAL KNEE ARTHROPLASTY;  Surgeon: Gaynelle Arabian, MD;  [facility];  Service: Orthopedics;  Laterality: Right;  Adductor Block   TRANSURETHRAL RESECTION OF PROSTATE N/A 08/08/2013   Procedure: TRANSURETHRAL RESECTION OF THE PROSTATE WITH GYRUS INSTRUMENTS;  Surgeon: Ailene Rud, MD;  [facility];  Service: Urology;  Laterality: N/A;   TRANSURETHRAL  RESECTION OF PROSTATE      There were no vitals filed for this visit.    Subjective Assessment - 11/15/20 1020     Subjective Pt states that he has been having worsening gait instability over the past several years. He saw PT at the neurorehab and was given some balance exercises that he has been completing for 4/5 days a week since that time. He feels that his balance and tremors continue to get worse. He has not fallen since 5/6 months ago while walking the dog.    Pertinent History Rt TKA    Limitations Walking;House hold activities    How long can you walk comfortably? he takes walks 10-30 minutes in the mornings during the week, and he walks 57mn on the weekends    Patient Stated Goals improve steadiness of his gait    Currently in Pain? No/denies                [facility] - 11/15/20 0001       Assessment   Medical Diagnosis gait instability    Referring Provider (PT) cMargette Fast MD    Onset Date/Surgical Date --   several years   Prior Therapy OP neurorehab      Precautions   Precautions Fall      Restrictions   Weight Bearing Restrictions No      Balance Screen   Has the patient fallen in the past 6 months Yes    How many times? 1    Has the patient had a decrease in activity level because of a fear of falling?  Yes    Is the patient reluctant to leave their home because of a fear of falling?  No      Home [place]idence    Living Arrangements Spouse/significant other    Additional Comments bedrooms are upstairs      Prior Function   Level of Independence Independent    Vocation Requirements accountant      Cognition   Overall Cognitive Status Within Functional Limits for tasks assessed      Posture/Postural Control   Posture Comments rounded shoulders, forward heafd      ROM / Strength   AROM / PROM / Strength Strength      Strength   Strength Assessment Site Hip;Knee    Right/Left Hip Right;Left     Right Hip Flexion 3+/5    Right Hip ABduction 3/5    Left Hip Flexion 3+/5    Left Hip ABduction 3/5      Flexibility   Soft Tissue Assessment /Muscle Length yes    Quadriceps prone knee bend: 90 deg bilateral      Transfers   Five time sit to stand comments  9 sec      Ambulation/Gait   Gait Comments decreased hip extension and  pushoff noted      Standardized Balance Assessment   Standardized Balance Assessment Berg Balance Test;PASS;Timed Up and Go Test;Dynamic Gait Index      Berg Balance Test   Sit to Stand Able to stand without using hands and stabilize independently    Standing Unsupported Able to stand safely 2 minutes    Sitting with Back Unsupported but Feet Supported on Floor or Stool Able to sit safely and securely 2 minutes    Stand to Sit Sits safely with minimal use of hands    Transfers Able to transfer safely, minor use of hands    Standing Unsupported with Eyes Closed Able to stand 10 seconds with supervision    Standing Unsupported with Feet Together Able to place feet together independently and stand 1 minute safely    From Standing, Reach Forward with Outstretched Arm Can reach forward >12 cm safely (5")    From Standing Position, Pick up Object from Floor Able to pick up shoe safely and easily    From Standing Position, Turn to Look Behind Over each Shoulder Turn sideways only but maintains balance    Turn 360 Degrees Needs close supervision or verbal cueing   unsteady when going Rt   Standing Unsupported, Alternately Place Feet on Step/Stool Able to stand independently and complete 8 steps >20 seconds    Standing Unsupported, One Foot in Front Able to take small step independently and hold 30 seconds    Standing on One Leg Tries to lift leg/unable to hold 3 seconds but remains standing independently    Total Score 43      Dynamic Gait Index   Level Surface Moderate Impairment    Change in Gait Speed Moderate Impairment    Gait with Horizontal Head Turns  Moderate Impairment    Gait with Vertical Head Turns Moderate Impairment    Gait and Pivot Turn Moderate Impairment    Step Over Obstacle Mild Impairment    Step Around Obstacles Mild Impairment    Steps Mild Impairment    Total Score 11                        Objective measurements completed on examination: See above findings.       [place] Adult PT Treatment/Exercise - 11/15/20 0001       Self-Care   Self-Care Other Self-Care Comments    Other Self-Care Comments  discussed using SPC until his balance is improved in order to decrease his risk of falling/injury      Exercises   Exercises Knee/Hip      Knee/Hip Exercises: Standing   SLS demo and instruction with SLS to avoid touching to the opposite leg                     PT Education - 11/15/20 1232     Education Details eval findings/POC; encouraged using [place] temporarily for safety until balance improves    Person(s) Educated Patient    Methods Explanation    Comprehension Verbalized understanding              PT Short Term Goals - 11/15/20 2005       PT SHORT TERM GOAL #1   Title Pt will be independent with his initial HEP to improve strength and balance.    Time 4    Period Weeks    Status New  PT Long Term Goals - 11/15/20 2006       PT LONG TERM GOAL #1   Title Pt will have greater than 4/5 MMT strength of BLE which will improve his gait mechanics and balance reactions in standing.    Time 8    Period Weeks    Status New      PT LONG TERM GOAL #2   Title Pt will have atleast 6 point increase on the BERG to reflect a significant improvement in his balance and decreased risk of falling.    Time 8    Period Weeks    Status New      PT LONG TERM GOAL #3   Title Pt will be independent with a regular/advanced LE strengthening program at his gym in order to maintain progress made during his POC.    Time 8    Period Weeks    Status New      PT LONG TERM  GOAL #4   Title Pt will score greater than 19 points on the DGI to reflect a decrease in his risk of falling and injuring himself.    Time 8    Period Weeks    Status New                    Plan - 11/15/20 1958     Clinical Impression Statement Pt is a pleasant 75 y.o M referred to OPPT with concerns over worsening gait instability. He has had OPPT before at the neurorehab clinic and has been trying to work on his HEP 4/5 days a week since being discharged over a year ago. He feels that his progress is plateauing and he is concerned he may fall. Pt is fairly active, going for daily walks and going to the gym several days a week. Pt did well on 5x sit to stand test. He demonstrates unsteadiness with his ambulation in the clinic, with limited hip extension and pushoff bilaterally. Pt has significant LE weakness, primarily in the hips, which is likely contributing to his altered gait mechanics and balance issues. He scored 43/56 on the BERG indicative of significant fall risk and his DGI was 11 points with overall slow gait speed and veering during attempts to walk in a straight path. Pt would benefit from skilled PT to address his balance limitations, LE weakness and safety awareness in order to promote his active lifestyle and decrease his risk of falling.    Personal Factors and Comorbidities Age;Time since onset of injury/illness/exacerbation    Examination-Activity Limitations Locomotion Level;Stairs    Examination-Participation Restrictions Community Activity    Stability/Clinical Decision Making Stable/Uncomplicated    Clinical Decision Making Low    Rehab Potential Good    PT Frequency 2x / week    PT Duration 8 weeks    PT Treatment/Interventions ADLs/Self Care Home Management;Gait training;Functional mobility training;Therapeutic activities;Therapeutic exercise;Neuromuscular re-education;Balance training;Stair training;Patient/family education;Manual techniques;Passive range of  motion    PT Next Visit Plan HEP for LE strength (pt goes to the gym so maybe teach him about equipment he can use); balance activity progression    PT Home Exercise Plan next visit    Consulted and Agree with Plan of Care Patient             Patient will benefit from skilled therapeutic intervention in order to improve the following deficits and impairments:  Abnormal gait, Decreased balance, Difficulty walking, Decreased endurance, Decreased strength, Decreased safety awareness, Impaired flexibility  Visit  Diagnosis: Unsteadiness on feet  Muscle weakness (generalized)  Other abnormalities of gait and mobility     Problem List Patient Active Problem List   Diagnosis Date Noted   Adult attention deficit disorder 06/04/2020   Chronic lymphocytic leukemia ([place]) 06/04/2020   Conductive hearing loss, bilateral 06/04/2020   Crohn's ileitis ([place]) 06/04/2020   Disorder of musculoskeletal system 06/04/2020   Erectile dysfunction 06/04/2020   Gastroesophageal reflux disease 06/04/2020   Gout 06/04/2020   History of adenomatous polyp of colon 06/04/2020   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   Hypothyroidism 06/04/2020   Increased frequency of urination 06/04/2020   Male hypogonadism 06/04/2020   Muscle pain 06/04/2020   Nocturia 06/04/2020   Obesity 06/04/2020   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   Osteopenia 06/04/2020   Peripheral neurogenic pain 06/04/2020   Pure hypercholesterolemia 06/04/2020   Recurrent falls 06/04/2020   Unspecified kyphosis, thoracic region 06/04/2020   Vitamin B12 deficiency 06/04/2020   Vitamin D deficiency 06/04/2020   Gait abnormality 05/17/2020   Ataxia 03/16/2020   Angina pectoris ([place]) 02/13/2019   Pseudophakia of both eyes 08/01/2018   History of total knee arthroplasty 03/29/2017   Stiffness of right knee 03/16/2017   OA (osteoarthritis) of knee 03/12/2017   Tremor, essential 12/03/2015   Essential hypertension  12/09/2014   Coronary artery disease involving native heart 12/08/2013   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 [place], [place], [address] Phone: [phone]   Fax:  [phone]  Name: CRUISE BAUMGARDNER MRN: [id number] Date of Birth: 05-31-1945

## 2020-11-16 ENCOUNTER — Other Ambulatory Visit (HOSPITAL_BASED_OUTPATIENT_CLINIC_OR_DEPARTMENT_OTHER): Payer: Self-pay

## 2020-11-19 ENCOUNTER — Other Ambulatory Visit: Payer: Self-pay

## 2020-11-19 ENCOUNTER — Other Ambulatory Visit: Payer: Self-pay | Admitting: Interventional Cardiology

## 2020-11-19 ENCOUNTER — Encounter: Payer: Self-pay | Admitting: Physical Therapy

## 2020-11-19 ENCOUNTER — Ambulatory Visit: Payer: Medicare Other | Admitting: Physical Therapy

## 2020-11-19 DIAGNOSIS — R2689 Other abnormalities of gait and mobility: Secondary | ICD-10-CM

## 2020-11-19 DIAGNOSIS — M6281 Muscle weakness (generalized): Secondary | ICD-10-CM

## 2020-11-19 DIAGNOSIS — R2681 Unsteadiness on feet: Secondary | ICD-10-CM

## 2020-11-19 DIAGNOSIS — Z9181 History of falling: Secondary | ICD-10-CM

## 2020-11-19 DIAGNOSIS — R293 Abnormal posture: Secondary | ICD-10-CM

## 2020-11-19 NOTE — Therapy (Signed)
[facility] [address], [place], [place], [address] Phone: [phone]   Fax:  [phone]  Physical Therapy Treatment  Patient Details  Name: Cole Martinez MRN: [id number] Date of Birth: 1945/10/10 Referring Provider (PT): Margette Fast, MD   Encounter Date: 11/19/2020   PT End of Session - 11/19/20 1010     Visit Number 2    Date for PT Re-Evaluation 01/10/21    Authorization Type Medicare A and B    Authorization Time Period 11/15/20 to 01/10/21    Progress Note Due on Visit 10    PT Start Time 1010    PT Stop Time 1049    PT Time Calculation (min) 39 min    Activity Tolerance Patient tolerated treatment well    Behavior During Therapy [facility] for tasks assessed/performed             Past Medical History:  Diagnosis Date   ADHD (attention deficit hyperactivity disorder)    B12 deficiency    BPH (benign prostatic hypertrophy)    Chronic lymphocytic leukemia (CLL), 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--  ONCOLOGIST-  DR Benay Spice   PT IS ASYMPTOMATIC--- LAST CBC W/ DIFF 06-24-2012 STABLE   Coronary artery disease CARDIOLOGIST- DR Daneen Schick   S/P STENTING LAD 1999 // Myoview 01/2019: EF 62, normal perfusion; Low Risk   Crohn's disease of ileum ([place]) [place]   ED (erectile dysfunction)    Elevated PSA    Essential and other specified forms of tremor 11/25/2012   Gait abnormality 05/17/2020   H/O adenomatous polyp of colon    Hyperlipidemia    Nocturia    OA (osteoarthritis)    Peripheral neuropathy    hx of, none current as of 08-04-13   Peyronie disease    S/P coronary artery stent placement OCT 1999 OF LAD   Tremor, hereditary, benign MILD RIGHT HAND    Past Surgical History:  Procedure Laterality Date   CARDIOVASCULAR STRESS TEST  11-01-2010 DR Daneen Schick   NORMAL PERFUSION STUDY/ EF 64%/ NO ISCHEMIA   cataract surgery  Bilateral feburary 2020   with lens placement    COLONOSCOPY WITH PROPOFOL N/A 09/24/2012    Procedure: COLONOSCOPY WITH PROPOFOL;  Surgeon: [name], MD;  [facility];  Service: Endoscopy;  Laterality: N/A;   CORONARY ANGIOPLASTY WITH STENT PLACEMENT  OCT 1999   STENT OF LAD   CORONARY STENT INTERVENTION N/A 02/14/2019   Procedure: CORONARY STENT INTERVENTION;  Surgeon: Belva Crome, MD;  Location: [facility];  Service: Cardiovascular;  Laterality: N/A;   CYSTOSCOPY WITH URETHRAL DILATATION  03/12/2017   Procedure: CYSTOSCOPY WITH URETHRAL DILATATION;  Surgeon: Gaynelle Arabian, MD;  [facility];  Service: Orthopedics;;  Ammie Dalton, Resident Assisting   CYSTOSCOPY WITH URETHRAL DILATATION N/A 10/03/2018   Procedure: CYSTOSCOPY WITH URETHRAL BALLOON DILATATION WITH BILATERAL RETROGRADE PYELOGRAPHY;  Surgeon: Raynelle Bring, MD;  [facility];  Service: Urology;  Laterality: N/A;   LAPAROSCOPIC INGUINAL HERNIA REPAIR Bilateral 01-10-2004   W/ MESH   LEFT HEART CATH AND CORONARY ANGIOGRAPHY N/A 02/13/2019   Procedure: LEFT HEART CATH AND CORONARY ANGIOGRAPHY;  Surgeon: Belva Crome, MD;  Location: [facility];  Service: Cardiovascular;  Laterality: N/A;   neck benign removed from neck  yrs ago   PROSTATE BIOPSY N/A 07/12/2012   Procedure: PROSTATE BIOPSY AND ULTRASOUND;  Surgeon: Ailene Rud, MD;  Location: [facility];  Service: Urology;  Laterality: N/A;   PROSTATE SURGERY  2002   tuna   REMOVAL LEFT NECK LYMPH NODE  1996   TONSILLECTOMY  CHILD   TOTAL KNEE ARTHROPLASTY Right 03/12/2017   Procedure: RIGHT TOTAL KNEE ARTHROPLASTY;  Surgeon: Gaynelle Arabian, MD;  [facility];  Service: Orthopedics;  Laterality: Right;  Adductor Block   TRANSURETHRAL RESECTION OF PROSTATE N/A 08/08/2013   Procedure: TRANSURETHRAL RESECTION OF THE PROSTATE WITH GYRUS INSTRUMENTS;  Surgeon: Ailene Rud, MD;  [facility];  Service: Urology;  Laterality: N/A;   TRANSURETHRAL RESECTION OF PROSTATE      There were no vitals  filed for this visit.                      [facility] Adult PT Treatment/Exercise - 11/19/20 0001       Knee/Hip Exercises: Aerobic   Recumbent Bike L4 x 5 min with concurrent discussion of status      Knee/Hip Exercises: Machines for Strengthening   Total Gym Leg Press Seat 8: Bil 60# 3x10, excellent control      Knee/Hip Exercises: Standing   Hip Abduction AROM;Stengthening;Both;1 set;10 reps      Knee/Hip Exercises: Supine   Bridges AROM;Strengthening;Both;1 set;10 reps                     PT Education - 11/19/20 1054     Education Details HEP    Person(s) Educated Patient    Methods Explanation;Demonstration;Verbal cues;Handout    Comprehension Returned demonstration;Verbalized understanding              PT Short Term Goals - 11/15/20 2005       PT SHORT TERM GOAL #1   Title Pt will be independent with his initial HEP to improve strength and balance.    Time 4    Period Weeks    Status New               PT Long Term Goals - 11/15/20 2006       PT LONG TERM GOAL #1   Title Pt will have greater than 4/5 MMT strength of BLE which will improve his gait mechanics and balance reactions in standing.    Time 8    Period Weeks    Status New      PT LONG TERM GOAL #2   Title Pt will have atleast 6 point increase on the BERG to reflect a significant improvement in his balance and decreased risk of falling.    Time 8    Period Weeks    Status New      PT LONG TERM GOAL #3   Title Pt will be independent with a regular/advanced LE strengthening program at his gym in order to maintain progress made during his POC.    Time 8    Period Weeks    Status New      PT LONG TERM GOAL #4   Title Pt will score greater than 19 points on the DGI to reflect a decrease in his risk of falling and injuring himself.    Time 8    Period Weeks    Status New                   Plan - 11/19/20 1014     Clinical Impression Statement Pt  arrives just coming from the gym. He is interested in learning more on how to use the leg strengthening machines  at the gym as well as possibly doing 2-3 aquatic visits as he would like to resume water exercises at his gym. Today we added a new [employer] program that incorporates hip strength, adding the leg press to his gym program and he will let PT know on Monday if he wants ( and when ) to add a few aquatic visits.    Personal Factors and Comorbidities Age;Time since onset of injury/illness/exacerbation    Examination-Activity Limitations Locomotion Level;Stairs    Examination-Participation Restrictions Community Activity    Stability/Clinical Decision Making Stable/Uncomplicated    Rehab Potential Good    PT Frequency 2x / week    PT Duration 8 weeks    PT Treatment/Interventions ADLs/Self Care Home Management;Gait training;Functional mobility training;Therapeutic activities;Therapeutic exercise;Neuromuscular re-education;Balance training;Stair training;Patient/family education;Manual techniques;Passive range of motion;Aquatic Therapy    PT Next Visit Plan Review new exercises given today, see what pt thinks about adding a few aquatic visits.    PT Home Exercise Plan Access Code: [email or web address]    Consulted and Agree with Plan of Care Patient             Patient will benefit from skilled therapeutic intervention in order to improve the following deficits and impairments:  Abnormal gait, Decreased balance, Difficulty walking, Decreased endurance, Decreased strength, Decreased safety awareness, Impaired flexibility  Visit Diagnosis: Unsteadiness on feet  Muscle weakness (generalized)  Other abnormalities of gait and mobility  Abnormal posture  History of falling     Problem List Patient Active Problem List   Diagnosis Date Noted   Adult attention deficit disorder 06/04/2020   Chronic lymphocytic leukemia ([place]) 06/04/2020   Conductive hearing loss, bilateral 06/04/2020   Crohn's  ileitis ([place]) 06/04/2020   Disorder of musculoskeletal system 06/04/2020   Erectile dysfunction 06/04/2020   Gastroesophageal reflux disease 06/04/2020   Gout 06/04/2020   History of adenomatous polyp of colon 06/04/2020   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   Hypothyroidism 06/04/2020   Increased frequency of urination 06/04/2020   Male hypogonadism 06/04/2020   Muscle pain 06/04/2020   Nocturia 06/04/2020   Obesity 06/04/2020   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   Osteopenia 06/04/2020   Peripheral neurogenic pain 06/04/2020   Pure hypercholesterolemia 06/04/2020   Recurrent falls 06/04/2020   Unspecified kyphosis, thoracic region 06/04/2020   Vitamin B12 deficiency 06/04/2020   Vitamin D deficiency 06/04/2020   Gait abnormality 05/17/2020   Ataxia 03/16/2020   Angina pectoris ([place]) 02/13/2019   Pseudophakia of both eyes 08/01/2018   History of total knee arthroplasty 03/29/2017   Stiffness of right knee 03/16/2017   OA (osteoarthritis) of knee 03/12/2017   Tremor, essential 12/03/2015   Essential hypertension 12/09/2014   Coronary artery disease involving native heart 12/08/2013   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, [place], [place], [address] Phone: [phone]   Fax:  [phone]  Name: Cole Martinez MRN: [id number] Date of Birth: January 07, 1946

## 2020-11-22 ENCOUNTER — Ambulatory Visit: Payer: Medicare Other | Admitting: Physical Therapy

## 2020-11-22 ENCOUNTER — Encounter: Payer: Self-pay | Admitting: Physical Therapy

## 2020-11-22 ENCOUNTER — Other Ambulatory Visit: Payer: Self-pay

## 2020-11-22 DIAGNOSIS — M6281 Muscle weakness (generalized): Secondary | ICD-10-CM | POA: Diagnosis not present

## 2020-11-22 DIAGNOSIS — R2681 Unsteadiness on feet: Secondary | ICD-10-CM | POA: Diagnosis not present

## 2020-11-22 DIAGNOSIS — R293 Abnormal posture: Secondary | ICD-10-CM

## 2020-11-22 DIAGNOSIS — R2689 Other abnormalities of gait and mobility: Secondary | ICD-10-CM | POA: Diagnosis not present

## 2020-11-22 DIAGNOSIS — Z9181 History of falling: Secondary | ICD-10-CM

## 2020-11-22 NOTE — Therapy (Signed)
[facility] [address], [place], [place], [address] Phone: [phone]   Fax:  [phone]  Physical Therapy Treatment  Patient Details  Name: Cole Martinez MRN: [id number] Date of Birth: 07-30-1945 Referring Provider (PT): Margette Fast, MD   Encounter Date: 11/22/2020   PT End of Session - 11/22/20 1058     Visit Number 3    Date for PT Re-Evaluation 01/10/21    Authorization Type Medicare A and B    Authorization Time Period 11/15/20 to 01/10/21    Progress Note Due on Visit 10    PT Start Time 1015    PT Stop Time 1100    PT Time Calculation (min) 45 min    Activity Tolerance Patient tolerated treatment well    Behavior During Therapy [facility] for tasks assessed/performed             Past Medical History:  Diagnosis Date   ADHD (attention deficit hyperactivity disorder)    B12 deficiency    BPH (benign prostatic hypertrophy)    Chronic lymphocytic leukemia (CLL), 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--  ONCOLOGIST-  DR Benay Spice   PT IS ASYMPTOMATIC--- LAST CBC W/ DIFF 06-24-2012 STABLE   Coronary artery disease CARDIOLOGIST- DR Daneen Schick   S/P STENTING LAD 1999 // Myoview 01/2019: EF 62, normal perfusion; Low Risk   Crohn's disease of ileum ([place]) [place]   ED (erectile dysfunction)    Elevated PSA    Essential and other specified forms of tremor 11/25/2012   Gait abnormality 05/17/2020   H/O adenomatous polyp of colon    Hyperlipidemia    Nocturia    OA (osteoarthritis)    Peripheral neuropathy    hx of, none current as of 08-04-13   Peyronie disease    S/P coronary artery stent placement OCT 1999 OF LAD   Tremor, hereditary, benign MILD RIGHT HAND    Past Surgical History:  Procedure Laterality Date   CARDIOVASCULAR STRESS TEST  11-01-2010 DR Daneen Schick   NORMAL PERFUSION STUDY/ EF 64%/ NO ISCHEMIA   cataract surgery  Bilateral feburary 2020   with lens placement    COLONOSCOPY WITH PROPOFOL N/A 09/24/2012    Procedure: COLONOSCOPY WITH PROPOFOL;  Surgeon: [name], MD;  Location: [facility];  Service: Endoscopy;  Laterality: N/A;   CORONARY ANGIOPLASTY WITH STENT PLACEMENT  OCT 1999   STENT OF LAD   CORONARY STENT INTERVENTION N/A 02/14/2019   Procedure: CORONARY STENT INTERVENTION;  Surgeon: Belva Crome, MD;  Location: [place] [facility];  Service: Cardiovascular;  Laterality: N/A;   CYSTOSCOPY WITH URETHRAL DILATATION  03/12/2017   Procedure: CYSTOSCOPY WITH URETHRAL DILATATION;  Surgeon: Gaynelle Arabian, MD;  [facility]: [facility];  Service: Orthopedics;;  Ammie Dalton, Resident Assisting   CYSTOSCOPY WITH URETHRAL DILATATION N/A 10/03/2018   Procedure: CYSTOSCOPY WITH URETHRAL BALLOON DILATATION WITH BILATERAL RETROGRADE PYELOGRAPHY;  Surgeon: Raynelle Bring, MD;  [facility]: [facility];  Service: Urology;  Laterality: N/A;   LAPAROSCOPIC INGUINAL HERNIA REPAIR Bilateral 01-10-2004   W/ MESH   LEFT HEART CATH AND CORONARY ANGIOGRAPHY N/A 02/13/2019   Procedure: LEFT HEART CATH AND CORONARY ANGIOGRAPHY;  Surgeon: Belva Crome, MD;  Location: [place] [facility];  Service: Cardiovascular;  Laterality: N/A;   neck benign removed from neck  yrs ago   PROSTATE BIOPSY N/A 07/12/2012   Procedure: PROSTATE BIOPSY AND ULTRASOUND;  Surgeon: Ailene Rud, MD;  Location: [facility];  Service: Urology;  Laterality: N/A;   PROSTATE SURGERY  2002   tuna   REMOVAL LEFT NECK LYMPH NODE  1996   TONSILLECTOMY  CHILD   TOTAL KNEE ARTHROPLASTY Right 03/12/2017   Procedure: RIGHT TOTAL KNEE ARTHROPLASTY;  Surgeon: Gaynelle Arabian, MD;  [facility];  Service: Orthopedics;  Laterality: Right;  Adductor Block   TRANSURETHRAL RESECTION OF PROSTATE N/A 08/08/2013   Procedure: TRANSURETHRAL RESECTION OF THE PROSTATE WITH GYRUS INSTRUMENTS;  Surgeon: Ailene Rud, MD;  [facility];  Service: Urology;  Laterality: N/A;   TRANSURETHRAL RESECTION OF PROSTATE      There were no vitals  filed for this visit.   Subjective Assessment - 11/22/20 1016     Pertinent History Rt TKA    Limitations Walking;House hold activities                               [place] Adult PT Treatment/Exercise - 11/22/20 0001       Self-Care   Self-Care Other Self-Care Comments    Other Self-Care Comments  Pt showed his HEP from neuro rehab for balance and scapular strength      Exercises   Exercises Knee/Hip      Knee/Hip Exercises: Aerobic   Recumbent Bike L5 x 5 min with concurrent discussion of status      Knee/Hip Exercises: Standing   Hip Abduction AROM;Left;Right;5 reps    Abduction Limitations Pt reported no awareness of hip muscle with this    Lateral Step Up Left;Right;1 set;10 reps;Hand Hold: 1;Step Height: 4"    SLS foot on step, stand tall through stance hip on/off x 5 reps each, Lt/Rt    Gait Training weight shifting for glut awareness in closed chain x 10 shifts      Knee/Hip Exercises: Seated   Marching Strengthening;20 reps;Weights    Marching Limitations hold 8lb on thighs, level shoulders to control trunk    Sit to Sand 10 reps;without UE support   blue tied tband around thighs     Knee/Hip Exercises: Supine   Bridges AROM;Both;10 reps    Bridges Limitations push through heels vs toes    Bridges with Clamshell Strengthening;Both;10 reps    Other Supine Knee/Hip Exercises single leg hip abduction clam with red loop (gave blue tied band for HEP) 1x15 each Rt/Lt   PT cued outer 1/3 range of hip abd for reps                    PT Education - 11/22/20 1057     Education Details revised and added hip strength    Person(s) Educated Patient    Methods Explanation;Demonstration   uses app   Comprehension Verbalized understanding              PT Short Term Goals - 11/22/20 1107       PT SHORT TERM GOAL #1   Title Pt will be independent with his initial HEP to improve strength and balance.    Status On-going      PT SHORT TERM  GOAL #2   Title -    Baseline -      PT SHORT TERM GOAL #3   Title -    Baseline -      PT SHORT TERM GOAL #4   Title -    Baseline -               PT Long  Term Goals - 11/15/20 2006       PT LONG TERM GOAL #1   Title Pt will have greater than 4/5 MMT strength of BLE which will improve his gait mechanics and balance reactions in standing.    Time 8    Period Weeks    Status New      PT LONG TERM GOAL #2   Title Pt will have atleast 6 point increase on the BERG to reflect a significant improvement in his balance and decreased risk of falling.    Time 8    Period Weeks    Status New      PT LONG TERM GOAL #3   Title Pt will be independent with a regular/advanced LE strengthening program at his gym in order to maintain progress made during his POC.    Time 8    Period Weeks    Status New      PT LONG TERM GOAL #4   Title Pt will score greater than 19 points on the DGI to reflect a decrease in his risk of falling and injuring himself.    Time 8    Period Weeks    Status New                   Plan - 11/22/20 1101     Clinical Impression Statement Pt just came from gym and had questions about standing hip abduction.  He is unable to feel his glutes with this.  PT worked with Pt on closed chain activation with SLS foot on step, weight shifting with glut squeeze, followed by hip abd A/ROM but Pt still struggled to feel glutes.  Hip stiffness limited ROM which may contribute.  PT revised HEP to include ther ex with which he had more awareness of including supine hip abd with blue band single leg and bridge with hip abd.  Encouraged Pt to use hip abd band with sit to stands too.  PT advised Pt to look for hip abd/add machines at gym to include in his routine.  Pt will continue to use app on his phone for HEP and has two codes: one for balance from past neuro rehab and one from current session that was updated today.    PT Frequency 2x / week    PT Duration 8 weeks     PT Treatment/Interventions ADLs/Self Care Home Management;Gait training;Functional mobility training;Therapeutic activities;Therapeutic exercise;Neuromuscular re-education;Balance training;Stair training;Patient/family education;Manual techniques;Passive range of motion;Aquatic Therapy    PT Next Visit Plan did app update new ther ex from last visit?  review and continue to work on hip strength, functional strength, counseling on gym machines, Pt may want several aquatic sessions in a few weeks    PT Home Exercise Plan Access Code: [email or web address]    [email or web address] and Agree with Plan of Care Patient             Patient will benefit from skilled therapeutic intervention in order to improve the following deficits and impairments:     Visit Diagnosis: Muscle weakness (generalized)  Unsteadiness on feet  Other abnormalities of gait and mobility  Abnormal posture  History of falling     Problem List Patient Active Problem List   Diagnosis Date Noted   Adult attention deficit disorder 06/04/2020   Chronic lymphocytic leukemia ([place]) 06/04/2020   Conductive hearing loss, bilateral 06/04/2020   Crohn's ileitis ([place]) 06/04/2020   Disorder of musculoskeletal system 06/04/2020   Erectile dysfunction 06/04/2020   Gastroesophageal  reflux disease 06/04/2020   Gout 06/04/2020   History of adenomatous polyp of colon 06/04/2020   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   Hypothyroidism 06/04/2020   Increased frequency of urination 06/04/2020   Male hypogonadism 06/04/2020   Muscle pain 06/04/2020   Nocturia 06/04/2020   Obesity 06/04/2020   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   Osteopenia 06/04/2020   Peripheral neurogenic pain 06/04/2020   Pure hypercholesterolemia 06/04/2020   Recurrent falls 06/04/2020   Unspecified kyphosis, thoracic region 06/04/2020   Vitamin B12 deficiency 06/04/2020   Vitamin D deficiency 06/04/2020   Gait abnormality 05/17/2020   Ataxia 03/16/2020    Angina pectoris ([place]) 02/13/2019   Pseudophakia of both eyes 08/01/2018   History of total knee arthroplasty 03/29/2017   Stiffness of right knee 03/16/2017   OA (osteoarthritis) of knee 03/12/2017   Tremor, essential 12/03/2015   Essential hypertension 12/09/2014   Coronary artery disease involving native heart 12/08/2013   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, [place], [place], [address] Phone: [phone]   Fax:  [phone]  Name: Cole Martinez MRN: [id number] Date of Birth: Feb 07, 1946

## 2020-11-22 NOTE — Patient Instructions (Signed)
Access Code: [email or web address] URL: [email or web address] Date: 11/22/2020 Prepared by: Venetia Night Chukwuemeka Artola  Exercises Supine Bridge - 1 x daily - 7 x weekly - 2 sets - 10 reps Hooklying Isometric Clamshell - 1 x daily - 7 x weekly - 2 sets - 10 reps Bridge with Hip Abduction and Resistance - 1 x daily - 7 x weekly - 2 sets - 10 reps Sit to Stand with Resistance Around Legs - 1 x daily - 7 x weekly - 2 sets - 10 reps Lateral Step Up with Counter Support - 1 x daily - 7 x weekly - 2 sets - 10 reps Seated Hip Flexion March with Ankle Weights - 1 x daily - 7 x weekly - 2 sets - 20 reps

## 2020-11-22 NOTE — Therapy (Deleted)
[facility] [address] [place], [place], [address] Phone: [phone]   Fax:  [phone]  Physical Therapy Treatment  Patient Details  Name: Cole Martinez MRN: [id number] Date of Birth: October 24, 1945 Referring Provider (PT): Margette Fast, MD   Encounter Date: 11/22/2020   PT End of Session - 11/22/20 1058     Visit Number 3    Date for PT Re-Evaluation 01/10/21    Authorization Type Medicare A and B    Authorization Time Period 11/15/20 to 01/10/21    Progress Note Due on Visit 10    PT Start Time 1015    PT Stop Time 1100    PT Time Calculation (min) 45 min    Activity Tolerance Patient tolerated treatment well    Behavior During Therapy [facility] for tasks assessed/performed             Past Medical History:  Diagnosis Date   ADHD (attention deficit hyperactivity disorder)    B12 deficiency    BPH (benign prostatic hypertrophy)    Chronic lymphocytic leukemia (CLL), 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--  ONCOLOGIST-  DR Benay Spice   PT IS ASYMPTOMATIC--- LAST CBC W/ DIFF 06-24-2012 STABLE   Coronary artery disease CARDIOLOGIST- DR Daneen Schick   S/P STENTING LAD 1999 // Myoview 01/2019: EF 62, normal perfusion; Low Risk   Crohn's disease of ileum ([place]) [place]   ED (erectile dysfunction)    Elevated PSA    Essential and other specified forms of tremor 11/25/2012   Gait abnormality 05/17/2020   H/O adenomatous polyp of colon    Hyperlipidemia    Nocturia    OA (osteoarthritis)    Peripheral neuropathy    hx of, none current as of 08-04-13   Peyronie disease    S/P coronary artery stent placement OCT 1999 OF LAD   Tremor, hereditary, benign MILD RIGHT HAND    Past Surgical History:  Procedure Laterality Date   CARDIOVASCULAR STRESS TEST  11-01-2010 DR Daneen Schick   NORMAL PERFUSION STUDY/ EF 64%/ NO ISCHEMIA   cataract surgery  Bilateral feburary 2020   with lens placement    COLONOSCOPY WITH PROPOFOL N/A 09/24/2012    Procedure: COLONOSCOPY WITH PROPOFOL;  Surgeon: [name], MD;  [facility];  Service: Endoscopy;  Laterality: N/A;   CORONARY ANGIOPLASTY WITH STENT PLACEMENT  OCT 1999   STENT OF LAD   CORONARY STENT INTERVENTION N/A 02/14/2019   Procedure: CORONARY STENT INTERVENTION;  Surgeon: Belva Crome, MD;  Location: [place] [facility];  Service: Cardiovascular;  Laterality: N/A;   CYSTOSCOPY WITH URETHRAL DILATATION  03/12/2017   Procedure: CYSTOSCOPY WITH URETHRAL DILATATION;  Surgeon: Gaynelle Arabian, MD;  [facility];  Service: Orthopedics;;  Ammie Dalton, Resident Assisting   CYSTOSCOPY WITH URETHRAL DILATATION N/A 10/03/2018   Procedure: CYSTOSCOPY WITH URETHRAL BALLOON DILATATION WITH BILATERAL RETROGRADE PYELOGRAPHY;  Surgeon: Raynelle Bring, MD;  [facility];  Service: Urology;  Laterality: N/A;   LAPAROSCOPIC INGUINAL HERNIA REPAIR Bilateral 01-10-2004   W/ MESH   LEFT HEART CATH AND CORONARY ANGIOGRAPHY N/A 02/13/2019   Procedure: LEFT HEART CATH AND CORONARY ANGIOGRAPHY;  Surgeon: Belva Crome, MD;  Location: [place] [facility];  Service: Cardiovascular;  Laterality: N/A;   neck benign removed from neck  yrs ago   PROSTATE BIOPSY N/A 07/12/2012   Procedure: PROSTATE BIOPSY AND ULTRASOUND;  Surgeon: Ailene Rud, MD;  Location: [facility];  Service: Urology;  Laterality: N/A;   PROSTATE SURGERY  2002   tuna   REMOVAL LEFT NECK LYMPH NODE  1996   TONSILLECTOMY  CHILD   TOTAL KNEE ARTHROPLASTY Right 03/12/2017   Procedure: RIGHT TOTAL KNEE ARTHROPLASTY;  Surgeon: Gaynelle Arabian, MD;  [facility];  Service: Orthopedics;  Laterality: Right;  Adductor Block   TRANSURETHRAL RESECTION OF PROSTATE N/A 08/08/2013   Procedure: TRANSURETHRAL RESECTION OF THE PROSTATE WITH GYRUS INSTRUMENTS;  Surgeon: Ailene Rud, MD;  [facility];  Service: Urology;  Laterality: N/A;   TRANSURETHRAL RESECTION OF PROSTATE      There were no vitals  filed for this visit.   Subjective Assessment - 11/22/20 1016     Pertinent History Rt TKA    Limitations Walking;House hold activities                               [place] Adult PT Treatment/Exercise - 11/22/20 0001       Self-Care   Self-Care Other Self-Care Comments (P)     Other Self-Care Comments  Pt showed his HEP from neuro rehab for balance and scapular strength (P)       Exercises   Exercises Knee/Hip (P)       Knee/Hip Exercises: Aerobic   Recumbent Bike L5 x 5 min with concurrent discussion of status (P)                      PT Education - 11/22/20 1057     Education Details revised and added hip strength    Person(s) Educated Patient    Methods Explanation;Demonstration   uses app   Comprehension Verbalized understanding              PT Short Term Goals - 11/22/20 1107       PT SHORT TERM GOAL #1   Title Pt will be independent with his initial HEP to improve strength and balance.    Status On-going      PT SHORT TERM GOAL #2   Title -    Baseline -      PT SHORT TERM GOAL #3   Title -    Baseline -      PT SHORT TERM GOAL #4   Title -    Baseline -               PT Long Term Goals - 11/15/20 2006       PT LONG TERM GOAL #1   Title Pt will have greater than 4/5 MMT strength of BLE which will improve his gait mechanics and balance reactions in standing.    Time 8    Period Weeks    Status New      PT LONG TERM GOAL #2   Title Pt will have atleast 6 point increase on the BERG to reflect a significant improvement in his balance and decreased risk of falling.    Time 8    Period Weeks    Status New      PT LONG TERM GOAL #3   Title Pt will be independent with a regular/advanced LE strengthening program at his gym in order to maintain progress made during his POC.    Time 8    Period Weeks    Status New      PT LONG TERM GOAL #4   Title Pt will score greater than 19 points on  the DGI to reflect a  decrease in his risk of falling and injuring himself.    Time 8    Period Weeks    Status New                   Plan - 11/22/20 1101     Clinical Impression Statement Pt just came from gym and had questions about standing hip abduction.  He is unable to feel his glutes with this.  PT worked with Pt on closed chain activation with SLS foot on step, weight shifting with glut squeeze, followed by hip abd A/ROM but Pt still struggled to feel glutes.  Hip stiffness limited ROM which may contribute.  PT revised HEP to include ther ex with which he had more awareness of including supine hip abd with blue band single leg and bridge with hip abd.  Encouraged Pt to use hip abd band with sit to stands too.  PT advised Pt to look for hip abd/add machines at gym to include in his routine.  Pt will continue to use app on his phone for HEP and has two codes: one for balance from past neuro rehab and one from current session that was updated today.    PT Frequency 2x / week    PT Duration 8 weeks    PT Treatment/Interventions ADLs/Self Care Home Management;Gait training;Functional mobility training;Therapeutic activities;Therapeutic exercise;Neuromuscular re-education;Balance training;Stair training;Patient/family education;Manual techniques;Passive range of motion;Aquatic Therapy    PT Next Visit Plan did app update new ther ex from last visit?  review and continue to work on hip strength, functional strength, counseling on gym machines, Pt may want several aquatic sessions in a few weeks    PT Home Exercise Plan Access Code: [email or web address]    [email or web address] and Agree with Plan of Care Patient             Patient will benefit from skilled therapeutic intervention in order to improve the following deficits and impairments:     Visit Diagnosis: Muscle weakness (generalized)  Unsteadiness on feet  Other abnormalities of gait and mobility  Abnormal posture  History of falling     Problem  List Patient Active Problem List   Diagnosis Date Noted   Adult attention deficit disorder 06/04/2020   Chronic lymphocytic leukemia ([place]) 06/04/2020   Conductive hearing loss, bilateral 06/04/2020   Crohn's ileitis ([place]) 06/04/2020   Disorder of musculoskeletal system 06/04/2020   Erectile dysfunction 06/04/2020   Gastroesophageal reflux disease 06/04/2020   Gout 06/04/2020   History of adenomatous polyp of colon 06/04/2020   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   Hypothyroidism 06/04/2020   Increased frequency of urination 06/04/2020   Male hypogonadism 06/04/2020   Muscle pain 06/04/2020   Nocturia 06/04/2020   Obesity 06/04/2020   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   Osteopenia 06/04/2020   Peripheral neurogenic pain 06/04/2020   Pure hypercholesterolemia 06/04/2020   Recurrent falls 06/04/2020   Unspecified kyphosis, thoracic region 06/04/2020   Vitamin B12 deficiency 06/04/2020   Vitamin D deficiency 06/04/2020   Gait abnormality 05/17/2020   Ataxia 03/16/2020   Angina pectoris ([place]) 02/13/2019   Pseudophakia of both eyes 08/01/2018   History of total knee arthroplasty 03/29/2017   Stiffness of right knee 03/16/2017   OA (osteoarthritis) of knee 03/12/2017   Tremor, essential 12/03/2015   Essential hypertension 12/09/2014   Coronary artery disease involving native heart 12/08/2013   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 [place], [place], [address] Phone: [phone]   Fax:  [phone]  Name: FINNICK OROSZ MRN: [id number] Date of Birth: 10/15/1945

## 2020-11-23 DIAGNOSIS — R7309 Other abnormal glucose: Secondary | ICD-10-CM | POA: Diagnosis not present

## 2020-11-23 DIAGNOSIS — E7800 Pure hypercholesterolemia, unspecified: Secondary | ICD-10-CM | POA: Diagnosis not present

## 2020-11-23 DIAGNOSIS — E230 Hypopituitarism: Secondary | ICD-10-CM | POA: Diagnosis not present

## 2020-11-23 DIAGNOSIS — R6882 Decreased libido: Secondary | ICD-10-CM | POA: Diagnosis not present

## 2020-11-23 DIAGNOSIS — M8580 Other specified disorders of bone density and structure, unspecified site: Secondary | ICD-10-CM | POA: Diagnosis not present

## 2020-11-24 ENCOUNTER — Ambulatory Visit: Payer: Medicare Other

## 2020-11-24 ENCOUNTER — Other Ambulatory Visit: Payer: Self-pay

## 2020-11-24 DIAGNOSIS — M6281 Muscle weakness (generalized): Secondary | ICD-10-CM | POA: Diagnosis not present

## 2020-11-24 DIAGNOSIS — R2689 Other abnormalities of gait and mobility: Secondary | ICD-10-CM | POA: Diagnosis not present

## 2020-11-24 DIAGNOSIS — Z9181 History of falling: Secondary | ICD-10-CM | POA: Diagnosis not present

## 2020-11-24 DIAGNOSIS — R2681 Unsteadiness on feet: Secondary | ICD-10-CM | POA: Diagnosis not present

## 2020-11-24 DIAGNOSIS — R293 Abnormal posture: Secondary | ICD-10-CM

## 2020-11-24 NOTE — Therapy (Signed)
[facility] [address], [place], [place], [address] Phone: [phone]   Fax:  [phone]  Physical Therapy Treatment  Patient Details  Name: Cole Martinez MRN: [id number] Date of Birth: 18-Dec-1945 Referring Provider (PT): Margette Fast, MD   Encounter Date: 11/24/2020   PT End of Session - 11/24/20 1233     Visit Number 4    Date for PT Re-Evaluation 01/10/21    Authorization Type Medicare A and B    Progress Note Due on Visit 10    PT Start Time 1147    PT Stop Time 1229    PT Time Calculation (min) 42 min    Equipment Utilized During Treatment Gait belt    Activity Tolerance Patient tolerated treatment well    Behavior During Therapy WFL for tasks assessed/performed             Past Medical History:  Diagnosis Date   ADHD (attention deficit hyperactivity disorder)    B12 deficiency    BPH (benign prostatic hypertrophy)    Chronic lymphocytic leukemia (CLL), 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--  ONCOLOGIST-  DR Benay Spice   PT IS ASYMPTOMATIC--- LAST CBC W/ DIFF 06-24-2012 STABLE   Coronary artery disease CARDIOLOGIST- DR Daneen Schick   S/P STENTING LAD 1999 // Myoview 01/2019: EF 62, normal perfusion; Low Risk   Crohn's disease of ileum ([place]) [place]   ED (erectile dysfunction)    Elevated PSA    Essential and other specified forms of tremor 11/25/2012   Gait abnormality 05/17/2020   H/O adenomatous polyp of colon    Hyperlipidemia    Nocturia    OA (osteoarthritis)    Peripheral neuropathy    hx of, none current as of 08-04-13   Peyronie disease    S/P coronary artery stent placement OCT 1999 OF LAD   Tremor, hereditary, benign MILD RIGHT HAND    Past Surgical History:  Procedure Laterality Date   CARDIOVASCULAR STRESS TEST  11-01-2010 DR Daneen Schick   NORMAL PERFUSION STUDY/ EF 64%/ NO ISCHEMIA   cataract surgery  Bilateral feburary 2020   with lens placement    COLONOSCOPY WITH PROPOFOL N/A  09/24/2012   Procedure: COLONOSCOPY WITH PROPOFOL;  Surgeon: [name], MD;  [facility];  Service: Endoscopy;  Laterality: N/A;   CORONARY ANGIOPLASTY WITH STENT PLACEMENT  OCT 1999   STENT OF LAD   CORONARY STENT INTERVENTION N/A 02/14/2019   Procedure: CORONARY STENT INTERVENTION;  Surgeon: [place] Crome, MD;  Location: [facility];  Service: Cardiovascular;  Laterality: N/A;   CYSTOSCOPY WITH URETHRAL DILATATION  03/12/2017   Procedure: CYSTOSCOPY WITH URETHRAL DILATATION;  Surgeon: Gaynelle Arabian, MD;  [facility];  Service: Orthopedics;;  Ammie Dalton, Resident Assisting   CYSTOSCOPY WITH URETHRAL DILATATION N/A 10/03/2018   Procedure: CYSTOSCOPY WITH URETHRAL BALLOON DILATATION WITH BILATERAL RETROGRADE PYELOGRAPHY;  Surgeon: Raynelle Bring, MD;  [facility];  Service: Urology;  Laterality: N/A;   LAPAROSCOPIC INGUINAL HERNIA REPAIR Bilateral 01-10-2004   W/ MESH   LEFT HEART CATH AND CORONARY ANGIOGRAPHY N/A 02/13/2019   Procedure: LEFT HEART CATH AND CORONARY ANGIOGRAPHY;  Surgeon: [place] Crome, MD;  Location: [facility];  Service: Cardiovascular;  Laterality: N/A;   neck benign removed from neck  yrs ago   PROSTATE BIOPSY N/A 07/12/2012   Procedure: PROSTATE BIOPSY AND ULTRASOUND;  Surgeon: Ailene Rud, MD;  Location: [facility];  Service: Urology;  Laterality: N/A;   PROSTATE SURGERY  2002   tuna   REMOVAL LEFT NECK LYMPH NODE  1996   TONSILLECTOMY  CHILD   TOTAL KNEE ARTHROPLASTY Right 03/12/2017   Procedure: RIGHT TOTAL KNEE ARTHROPLASTY;  Surgeon: Gaynelle Arabian, MD;  [facility];  Service: Orthopedics;  Laterality: Right;  Adductor Block   TRANSURETHRAL RESECTION OF PROSTATE N/A 08/08/2013   Procedure: TRANSURETHRAL RESECTION OF THE PROSTATE WITH GYRUS INSTRUMENTS;  Surgeon: Ailene Rud, MD;  [facility];  Service: Urology;  Laterality: N/A;   TRANSURETHRAL RESECTION OF PROSTATE      There were no  vitals filed for this visit.   Subjective Assessment - 11/24/20 1150     Subjective I haven't been doing my exercises as much as I should because we had a death in our family.    Patient Stated Goals improve steadiness of his gait    Currently in Pain? No/denies                               [facility] Adult PT Treatment/Exercise - 11/24/20 0001       Knee/Hip Exercises: Aerobic   Recumbent Bike L5 x 5 min with concurrent discussion of status      Knee/Hip Exercises: Standing   Other Standing Knee Exercises alternating step taps on 4" (edge of treadmill) 2x10      Knee/Hip Exercises: Seated   Marching Strengthening;20 reps;Weights    Marching Limitations holding 10# kettlebell on thigh    Sit to Sand without UE support;15 reps   blue tied tband around thighs                Balance Exercises - 11/24/20 0001       Balance Exercises: Standing   Tandem Stance Foam/compliant surface;Upper extremity support 1;3 reps;20 secs    Sidestepping Upper extremity support;Limitations   stepping over hurdle   Other Standing Exercises box stepping with discs on floor x 5 laps each direction-gait belt used for safety    Other Standing Exercises Comments standing on foam pad: feet together eyes open x 30 seconds                  PT Short Term Goals - 11/24/20 1151       PT SHORT TERM GOAL #1   Title Pt will be independent with his initial HEP to improve strength and balance.    Status Achieved               PT Long Term Goals - 11/24/20 1152       PT LONG TERM GOAL #1   Baseline --                   Plan - 11/24/20 1234     Clinical Impression Statement Pt has not had time to do exercises over the past few days.  Session focused on balance activities and PT provided cueing, guarding and verbal cues for technique.  Pt requires close guarding with dynamic balance activities.  Pt with forward head and flexed trunk posture and cueing was  provided to improve this.  Pt will continue to benefit from skilled PT to address balance and functional strength to improve safety at home and in the community.    PT Frequency 2x / week    PT Duration 8 weeks    PT Treatment/Interventions ADLs/Self Care Home Management;Gait training;Functional mobility training;Therapeutic  activities;Therapeutic exercise;Neuromuscular re-education;Balance training;Stair training;Patient/family education;Manual techniques;Passive range of motion;Aquatic Therapy    PT Next Visit Plan review and continue to work on hip strength, functional strength, counseling on gym machines, Pt may want several aquatic sessions in a few weeks    PT Home Exercise Plan Access Code: [email or web address]    Recommended Other Services initial cert is signed    Consulted and Agree with Plan of Care Patient             Patient will benefit from skilled therapeutic intervention in order to improve the following deficits and impairments:  Abnormal gait, Decreased balance, Difficulty walking, Decreased endurance, Decreased strength, Decreased safety awareness, Impaired flexibility  Visit Diagnosis: Muscle weakness (generalized)  Unsteadiness on feet  Other abnormalities of gait and mobility  Abnormal posture     Problem List Patient Active Problem List   Diagnosis Date Noted   Adult attention deficit disorder 06/04/2020   Chronic lymphocytic leukemia ([place]) 06/04/2020   Conductive hearing loss, bilateral 06/04/2020   Crohn's ileitis ([place]) 06/04/2020   Disorder of musculoskeletal system 06/04/2020   Erectile dysfunction 06/04/2020   Gastroesophageal reflux disease 06/04/2020   Gout 06/04/2020   History of adenomatous polyp of colon 06/04/2020   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   Hypothyroidism 06/04/2020   Increased frequency of urination 06/04/2020   Male hypogonadism 06/04/2020   Muscle pain 06/04/2020   Nocturia 06/04/2020   Obesity 06/04/2020    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   Osteopenia 06/04/2020   Peripheral neurogenic pain 06/04/2020   Pure hypercholesterolemia 06/04/2020   Recurrent falls 06/04/2020   Unspecified kyphosis, thoracic region 06/04/2020   Vitamin B12 deficiency 06/04/2020   Vitamin D deficiency 06/04/2020   Gait abnormality 05/17/2020   Ataxia 03/16/2020   Angina pectoris ([place]) 02/13/2019   Pseudophakia of both eyes 08/01/2018   History of total knee arthroplasty 03/29/2017   Stiffness of right knee 03/16/2017   OA (osteoarthritis) of knee 03/12/2017   Tremor, essential 12/03/2015   Essential hypertension 12/09/2014   Coronary artery disease involving native heart 12/08/2013   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 [place], [place], [address] Phone: [phone]   Fax:  [phone]  Name: JERMERY CARATACHEA MRN: [id number] Date of Birth: 08-Oct-1945

## 2020-12-02 ENCOUNTER — Encounter: Payer: Medicare Other | Admitting: Physical Therapy

## 2020-12-03 DIAGNOSIS — R350 Frequency of micturition: Secondary | ICD-10-CM | POA: Diagnosis not present

## 2020-12-03 DIAGNOSIS — N35011 Post-traumatic bulbous urethral stricture: Secondary | ICD-10-CM | POA: Diagnosis not present

## 2020-12-03 DIAGNOSIS — R310 Gross hematuria: Secondary | ICD-10-CM | POA: Diagnosis not present

## 2020-12-03 DIAGNOSIS — N401 Enlarged prostate with lower urinary tract symptoms: Secondary | ICD-10-CM | POA: Diagnosis not present

## 2020-12-07 ENCOUNTER — Other Ambulatory Visit (HOSPITAL_COMMUNITY): Payer: Self-pay

## 2020-12-09 ENCOUNTER — Encounter: Payer: Self-pay | Admitting: Physical Therapy

## 2020-12-09 ENCOUNTER — Ambulatory Visit: Payer: Medicare Other | Attending: Neurology | Admitting: Physical Therapy

## 2020-12-09 ENCOUNTER — Other Ambulatory Visit: Payer: Self-pay

## 2020-12-09 DIAGNOSIS — F900 Attention-deficit hyperactivity disorder, predominantly inattentive type: Secondary | ICD-10-CM | POA: Diagnosis not present

## 2020-12-09 DIAGNOSIS — Z1389 Encounter for screening for other disorder: Secondary | ICD-10-CM | POA: Diagnosis not present

## 2020-12-09 DIAGNOSIS — M8580 Other specified disorders of bone density and structure, unspecified site: Secondary | ICD-10-CM | POA: Diagnosis not present

## 2020-12-09 DIAGNOSIS — I2510 Atherosclerotic heart disease of native coronary artery without angina pectoris: Secondary | ICD-10-CM | POA: Diagnosis not present

## 2020-12-09 DIAGNOSIS — R2689 Other abnormalities of gait and mobility: Secondary | ICD-10-CM | POA: Insufficient documentation

## 2020-12-09 DIAGNOSIS — C9110 Chronic lymphocytic leukemia of B-cell type not having achieved remission: Secondary | ICD-10-CM | POA: Diagnosis not present

## 2020-12-09 DIAGNOSIS — Z9181 History of falling: Secondary | ICD-10-CM | POA: Diagnosis not present

## 2020-12-09 DIAGNOSIS — Z0001 Encounter for general adult medical examination with abnormal findings: Secondary | ICD-10-CM | POA: Diagnosis not present

## 2020-12-09 DIAGNOSIS — R7303 Prediabetes: Secondary | ICD-10-CM | POA: Diagnosis not present

## 2020-12-09 DIAGNOSIS — N400 Enlarged prostate without lower urinary tract symptoms: Secondary | ICD-10-CM | POA: Diagnosis not present

## 2020-12-09 DIAGNOSIS — R293 Abnormal posture: Secondary | ICD-10-CM | POA: Diagnosis not present

## 2020-12-09 DIAGNOSIS — M6281 Muscle weakness (generalized): Secondary | ICD-10-CM | POA: Insufficient documentation

## 2020-12-09 DIAGNOSIS — R296 Repeated falls: Secondary | ICD-10-CM | POA: Diagnosis not present

## 2020-12-09 DIAGNOSIS — R2681 Unsteadiness on feet: Secondary | ICD-10-CM | POA: Insufficient documentation

## 2020-12-09 DIAGNOSIS — I10 Essential (primary) hypertension: Secondary | ICD-10-CM | POA: Diagnosis not present

## 2020-12-09 DIAGNOSIS — K219 Gastro-esophageal reflux disease without esophagitis: Secondary | ICD-10-CM | POA: Diagnosis not present

## 2020-12-09 DIAGNOSIS — E230 Hypopituitarism: Secondary | ICD-10-CM | POA: Diagnosis not present

## 2020-12-09 DIAGNOSIS — E7800 Pure hypercholesterolemia, unspecified: Secondary | ICD-10-CM | POA: Diagnosis not present

## 2020-12-09 DIAGNOSIS — Z0000 Encounter for general adult medical examination without abnormal findings: Secondary | ICD-10-CM | POA: Diagnosis not present

## 2020-12-09 NOTE — Therapy (Signed)
[facility] [place], [place], [address] Phone: [phone]   Fax:  [phone]  Physical Therapy Treatment  Patient Details  Name: Cole Martinez MRN: [id number] Date of Birth: Aug 04, 1945 Referring Provider (PT): Margette Fast, MD   Encounter Date: 12/09/2020   PT End of Session - 12/09/20 1207     Visit Number 5    Date for PT Re-Evaluation 01/10/21    Authorization Type Medicare A and B    Authorization - Visit Number 5    Progress Note Due on Visit 10    PT Start Time 1107    PT Stop Time 0315    PT Time Calculation (min) 38 min    Equipment Utilized During Treatment Gait belt    Activity Tolerance Patient tolerated treatment well    Behavior During Therapy WFL for tasks assessed/performed             Past Medical History:  Diagnosis Date   ADHD (attention deficit hyperactivity disorder)    B12 deficiency    BPH (benign prostatic hypertrophy)    Chronic lymphocytic leukemia (CLL), 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--  ONCOLOGIST-  DR Benay Spice   PT IS ASYMPTOMATIC--- LAST CBC W/ DIFF 06-24-2012 STABLE   Coronary artery disease CARDIOLOGIST- DR Daneen Schick   S/P STENTING LAD 1999 // Myoview 01/2019: EF 62, normal perfusion; Low Risk   Crohn's disease of ileum ([place]) [place]   ED (erectile dysfunction)    Elevated PSA    Essential and other specified forms of tremor 11/25/2012   Gait abnormality 05/17/2020   H/O adenomatous polyp of colon    Hyperlipidemia    Nocturia    OA (osteoarthritis)    Peripheral neuropathy    hx of, none current as of 08-04-13   Peyronie disease    S/P coronary artery stent placement OCT 1999 OF LAD   Tremor, hereditary, benign MILD RIGHT HAND    Past Surgical History:  Procedure Laterality Date   CARDIOVASCULAR STRESS TEST  11-01-2010 DR Daneen Schick   NORMAL PERFUSION STUDY/ EF 64%/ NO ISCHEMIA   cataract surgery  Bilateral feburary 2020   with lens placement     COLONOSCOPY WITH PROPOFOL N/A 09/24/2012   Procedure: COLONOSCOPY WITH PROPOFOL;  Surgeon: [name], MD;  [facility];  Service: Endoscopy;  Laterality: N/A;   CORONARY ANGIOPLASTY WITH STENT PLACEMENT  OCT 1999   STENT OF LAD   CORONARY STENT INTERVENTION N/A 02/14/2019   Procedure: CORONARY STENT INTERVENTION;  Surgeon: [place] Crome, MD;  Location: [facility];  Service: Cardiovascular;  Laterality: N/A;   CYSTOSCOPY WITH URETHRAL DILATATION  03/12/2017   Procedure: CYSTOSCOPY WITH URETHRAL DILATATION;  Surgeon: Gaynelle Arabian, MD;  [facility];  Service: Orthopedics;;  Ammie Dalton, Resident Assisting   CYSTOSCOPY WITH URETHRAL DILATATION N/A 10/03/2018   Procedure: CYSTOSCOPY WITH URETHRAL BALLOON DILATATION WITH BILATERAL RETROGRADE PYELOGRAPHY;  Surgeon: Raynelle Bring, MD;  [facility];  Service: Urology;  Laterality: N/A;   LAPAROSCOPIC INGUINAL HERNIA REPAIR Bilateral 01-10-2004   W/ MESH   LEFT HEART CATH AND CORONARY ANGIOGRAPHY N/A 02/13/2019   Procedure: LEFT HEART CATH AND CORONARY ANGIOGRAPHY;  Surgeon: [place] Crome, MD;  Location: [facility];  Service: Cardiovascular;  Laterality: N/A;   neck benign removed from neck  yrs ago   PROSTATE BIOPSY N/A 07/12/2012   Procedure: PROSTATE BIOPSY AND ULTRASOUND;  Surgeon: Ailene Rud, MD;  Location: [facility];  Service: Urology;  Laterality: N/A;   PROSTATE SURGERY  2002   tuna   REMOVAL LEFT NECK LYMPH NODE  1996   TONSILLECTOMY  CHILD   TOTAL KNEE ARTHROPLASTY Right 03/12/2017   Procedure: RIGHT TOTAL KNEE ARTHROPLASTY;  Surgeon: Gaynelle Arabian, MD;  [facility];  Service: Orthopedics;  Laterality: Right;  Adductor Block   TRANSURETHRAL RESECTION OF PROSTATE N/A 08/08/2013   Procedure: TRANSURETHRAL RESECTION OF THE PROSTATE WITH GYRUS INSTRUMENTS;  Surgeon: Ailene Rud, MD;  [facility];  Service: Urology;  Laterality: N/A;   TRANSURETHRAL RESECTION OF  PROSTATE      There were no vitals filed for this visit.   Subjective Assessment - 12/09/20 1108     Subjective Pt states that he is planning to get back on his program. He is not having problems with his HEP.    Patient Stated Goals improve steadiness of his gait    Currently in Pain? No/denies                               [facility] Adult PT Treatment/Exercise - 12/09/20 0001       Knee/Hip Exercises: Aerobic   Recumbent Bike L5 x5 min PT present to discuss HEP questions      Knee/Hip Exercises: Machines for Strengthening   Total Gym Leg Press seat 7: #80 x15 reps, PT discussing safe exercise progressions with leg press at his gym      Knee/Hip Exercises: Standing   Forward Step Up 2 sets;Both;10 reps;Hand Hold: 2;Hand Hold: 1;Step Height: 6"    Forward Step Up Limitations opposite LE skipping a step to encourage hip extension on stance leg                 Balance Exercises - 12/09/20 0001       Balance Exercises: Standing   Other Standing Exercises balance reactions sideways and backwards x10 reps with PT removing conterpressure at pelvis    Other Standing Exercises Comments tandem pallof press with red TB x10 reps each direction                PT Education - 12/09/20 1206     Education Details updates to HEP    Person(s) Educated Patient    Methods Explanation    Comprehension Verbalized understanding              PT Short Term Goals - 11/24/20 1151       PT SHORT TERM GOAL #1   Title Pt will be independent with his initial HEP to improve strength and balance.    Status Achieved               PT Long Term Goals - 11/24/20 1152       PT LONG TERM GOAL #1   Baseline --                   Plan - 12/09/20 1213     Clinical Impression Statement Today's session continued with focus on hip extensor strength and balance reactions. Pt did well with pallof press trunk control but had more difficulty with  posterior perturbations and balance reactions at the ankle. Pt is doing leg press at his local gym and PT educated him on proper set up and resistance progressions. Pt had good understanding of HEP adjustments and progressions for home.    PT  Frequency 2x / week    PT Duration 8 weeks    PT Treatment/Interventions ADLs/Self Care Home Management;Gait training;Functional mobility training;Therapeutic activities;Therapeutic exercise;Neuromuscular re-education;Balance training;Stair training;Patient/family education;Manual techniques;Passive range of motion;Aquatic Therapy    PT Next Visit Plan continue to work on hip strength, functional strength, counseling on gym machines, Pt may want several aquatic sessions in a few weeks    PT Home Exercise Plan Access Code: [email or web address]    Consulted and Agree with Plan of Care Patient             Patient will benefit from skilled therapeutic intervention in order to improve the following deficits and impairments:  Abnormal gait, Decreased balance, Difficulty walking, Decreased endurance, Decreased strength, Decreased safety awareness, Impaired flexibility  Visit Diagnosis: Muscle weakness (generalized)  Unsteadiness on feet  Other abnormalities of gait and mobility     Problem List Patient Active Problem List   Diagnosis Date Noted   Adult attention deficit disorder 06/04/2020   Chronic lymphocytic leukemia ([place]) 06/04/2020   Conductive hearing loss, bilateral 06/04/2020   Crohn's ileitis ([place]) 06/04/2020   Disorder of musculoskeletal system 06/04/2020   Erectile dysfunction 06/04/2020   Gastroesophageal reflux disease 06/04/2020   Gout 06/04/2020   History of adenomatous polyp of colon 06/04/2020   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   Hypothyroidism 06/04/2020   Increased frequency of urination 06/04/2020   Male hypogonadism 06/04/2020   Muscle pain 06/04/2020   Nocturia 06/04/2020   Obesity 06/04/2020   Osteoarthritis of  lumbar spine 06/04/2020   Osteopenia 06/04/2020   Peripheral neurogenic pain 06/04/2020   Pure hypercholesterolemia 06/04/2020   Recurrent falls 06/04/2020   Unspecified kyphosis, thoracic region 06/04/2020   Vitamin B12 deficiency 06/04/2020   Vitamin D deficiency 06/04/2020   Gait abnormality 05/17/2020   Ataxia 03/16/2020   Angina pectoris ([place]) 02/13/2019   Pseudophakia of both eyes 08/01/2018   History of total knee arthroplasty 03/29/2017   Stiffness of right knee 03/16/2017   OA (osteoarthritis) of knee 03/12/2017   Tremor, essential 12/03/2015   Essential hypertension 12/09/2014   Coronary artery disease involving native heart 12/08/2013   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

## 2020-12-10 ENCOUNTER — Ambulatory Visit: Payer: Medicare Other | Admitting: Oncology

## 2020-12-10 ENCOUNTER — Ambulatory Visit: Payer: Medicare Other

## 2020-12-10 ENCOUNTER — Other Ambulatory Visit: Payer: Medicare Other

## 2020-12-13 ENCOUNTER — Inpatient Hospital Stay: Payer: Medicare Other | Attending: Oncology

## 2020-12-13 ENCOUNTER — Other Ambulatory Visit: Payer: Self-pay

## 2020-12-13 ENCOUNTER — Inpatient Hospital Stay: Payer: Medicare Other

## 2020-12-13 ENCOUNTER — Inpatient Hospital Stay (HOSPITAL_BASED_OUTPATIENT_CLINIC_OR_DEPARTMENT_OTHER): Payer: Medicare Other | Admitting: Oncology

## 2020-12-13 VITALS — BP 145/75 | HR 60 | Temp 97.8°F | Resp 18 | Ht 68.0 in | Wt 182.0 lb

## 2020-12-13 DIAGNOSIS — C9110 Chronic lymphocytic leukemia of B-cell type not having achieved remission: Secondary | ICD-10-CM | POA: Diagnosis not present

## 2020-12-13 LAB — CMP (CANCER CENTER ONLY)
ALT: 13 U/L (ref 0–44)
AST: 16 U/L (ref 15–41)
Albumin: 4.3 g/dL (ref 3.5–5.0)
Alkaline Phosphatase: 56 U/L (ref 38–126)
Anion gap: 7 (ref 5–15)
BUN: 18 mg/dL (ref 8–23)
CO2: 29 mmol/L (ref 22–32)
Calcium: 8.8 mg/dL — ABNORMAL LOW (ref 8.9–10.3)
Chloride: 100 mmol/L (ref 98–111)
Creatinine: 0.98 mg/dL (ref 0.61–1.24)
GFR, Estimated: >60 mL/min (ref >60)
Glucose, Bld: 88 mg/dL (ref 70–99)
Potassium: 4.8 mmol/L (ref 3.5–5.1)
Sodium: 136 mmol/L (ref 135–145)
Total Bilirubin: 0.8 mg/dL (ref 0.3–1.2)
Total Protein: 6.4 g/dL — ABNORMAL LOW (ref 6.5–8.1)

## 2020-12-13 LAB — CBC WITH DIFFERENTIAL (CANCER CENTER ONLY)
Abs Immature Granulocytes: 0.02 10*3/uL (ref 0.00–0.07)
Basophils Absolute: 0.1 10*3/uL (ref 0.0–0.1)
Basophils Relative: 1 %
Eosinophils Absolute: 0.2 10*3/uL (ref 0.0–0.5)
Eosinophils Relative: 3 %
HCT: 45.5 % (ref 39.0–52.0)
Hemoglobin: 15.4 g/dL (ref 13.0–17.0)
Immature Granulocytes: 0 %
Lymphocytes Relative: 20 %
Lymphs Abs: 1.4 10*3/uL (ref 0.7–4.0)
MCH: 30.7 pg (ref 26.0–34.0)
MCHC: 33.8 g/dL (ref 30.0–36.0)
MCV: 90.8 fL (ref 80.0–100.0)
Monocytes Absolute: 0.7 10*3/uL (ref 0.1–1.0)
Monocytes Relative: 10 %
Neutro Abs: 4.7 10*3/uL (ref 1.7–7.7)
Neutrophils Relative %: 66 %
Platelet Count: 224 10*3/uL (ref 150–400)
RBC: 5.01 MIL/uL (ref 4.22–5.81)
RDW: 13.6 % (ref 11.5–15.5)
WBC Count: 7.2 10*3/uL (ref 4.0–10.5)
nRBC: 0 % (ref 0.0–0.2)

## 2020-12-13 NOTE — Progress Notes (Signed)
  [facility] OFFICE PROGRESS NOTE   Diagnosis: CLL  INTERVAL HISTORY:   Cole Martinez returns as scheduled.  No palpable lymph nodes.  No fever or night sweats.  Good appetite.  No recent infection.  He is being scheduled for a prostate procedure with Dr. Alinda Money.  He is up-to-date on influenza and COVID vaccines.  Objective:  Vital signs in last 24 hours:  Blood pressure (!) 145/75, pulse 60, temperature 97.8 F (36.6 C), temperature source Oral, resp. rate 18, height 5' 8"  (1.727 m), weight 182 lb (82.6 kg), SpO2 100 %.    Lymphatics: No cervical, supraclavicular, axillary, or inguinal nodes Resp: Lungs clear bilaterally Cardio: Regular rate and rhythm GI: No hepatosplenomegaly Vascular: No leg edema, chronic stasis change at the lower leg bilaterally  Lab Results:  Lab Results  Component Value Date   WBC 7.2 12/13/2020   HGB 15.4 12/13/2020   HCT 45.5 12/13/2020   MCV 90.8 12/13/2020   PLT 224 12/13/2020   NEUTROABS 4.7 12/13/2020    CMP  Lab Results  Component Value Date   NA 136 12/13/2020   K 4.8 12/13/2020   CL 100 12/13/2020   CO2 29 12/13/2020   GLUCOSE 88 12/13/2020   BUN 18 12/13/2020   CREATININE 0.98 12/13/2020   CALCIUM 8.8 (L) 12/13/2020   PROT 6.4 (L) 12/13/2020   ALBUMIN 4.3 12/13/2020   AST 16 12/13/2020   ALT 13 12/13/2020   ALKPHOS 56 12/13/2020   BILITOT 0.8 12/13/2020   GFRNONAA >60 12/13/2020   GFRAA >60 04/29/2019     Medications: I have reviewed the patient's current medications.   Assessment/Plan: Chronic lymphocytic leukemia, diagnosed in 1996. He remains asymptomatic and stable from a hematologic standpoint. 2. pneumococcal vaccine given on 11/30/2015 , 13 valent pneumonia vaccine given 11/13/2013     Disposition: Cole Martinez is stable from a hematologic standpoint.  No clinical evidence for progression of the CLL.  He will return for an office and lab visit in 1 year.  He will seek medical attention for  symptoms of an infection.  Cole Coder, MD  12/13/2020  10:44 AM

## 2020-12-14 ENCOUNTER — Encounter: Payer: Self-pay | Admitting: Cardiology

## 2020-12-14 ENCOUNTER — Telehealth: Payer: Self-pay | Admitting: Interventional Cardiology

## 2020-12-14 ENCOUNTER — Other Ambulatory Visit: Payer: Self-pay | Admitting: Urology

## 2020-12-14 ENCOUNTER — Ambulatory Visit: Payer: Medicare Other | Admitting: Physical Therapy

## 2020-12-14 ENCOUNTER — Encounter: Payer: Self-pay | Admitting: Physical Therapy

## 2020-12-14 DIAGNOSIS — R293 Abnormal posture: Secondary | ICD-10-CM | POA: Diagnosis not present

## 2020-12-14 DIAGNOSIS — Z9181 History of falling: Secondary | ICD-10-CM | POA: Diagnosis not present

## 2020-12-14 DIAGNOSIS — R2681 Unsteadiness on feet: Secondary | ICD-10-CM

## 2020-12-14 DIAGNOSIS — R2689 Other abnormalities of gait and mobility: Secondary | ICD-10-CM | POA: Diagnosis not present

## 2020-12-14 DIAGNOSIS — M6281 Muscle weakness (generalized): Secondary | ICD-10-CM | POA: Diagnosis not present

## 2020-12-14 NOTE — Patient Instructions (Signed)
Access Code: [email or web address]: [email or web address]: 10/11/2022Prepared by: [place] - 1 x daily - 7 x weekly - 3 sets - 10 reps  Clam with Resistance - 1 x daily - 7 x weekly - 3 sets - 10 reps  Clam with Resistance (Mirrored) - 1 x daily - 7 x weekly - 3 sets - 10 reps  Sit to Stand with Resistance Around Legs - 1 x daily - 7 x weekly - 2 sets - 10 reps  Seated Hip Flexion March with Ankle Weights - 1 x daily - 7 x weekly - 2 sets - 20 reps  Lateral Step Up with Counter Support - 1 x daily - 7 x weekly - 2 sets - 10 reps  Forward Step Up with Unilateral Counter Support - 1 x daily - 7 x weekly - 2 sets - 10 reps  [facility] [address] [place], [place] [address] Phone # [phone] Fax [phone]

## 2020-12-14 NOTE — Telephone Encounter (Signed)
error 

## 2020-12-14 NOTE — Therapy (Signed)
[facility] @ [place], [place], [address] Phone: [phone]   Fax:  [phone]  Physical Therapy Treatment  Patient Details  Name: Cole Martinez MRN: [id number] Date of Birth: [birth year] Referring Provider (PT): Margette Fast, MD   Encounter Date: 12/14/2020   PT End of Session - 12/14/20 1148     Visit Number 6    Date for PT Re-Evaluation 01/10/21    Authorization Type Medicare A and B    Authorization - Visit Number 6    Progress Note Due on Visit 10    PT Start Time 1101    PT Stop Time 1140    PT Time Calculation (min) 39 min    Equipment Utilized During Treatment Gait belt    Activity Tolerance Patient tolerated treatment well;No increased pain    Behavior During Therapy WFL for tasks assessed/performed             Past Medical History:  Diagnosis Date   ADHD (attention deficit hyperactivity disorder)    B12 deficiency    BPH (benign prostatic hypertrophy)    Chronic lymphocytic leukemia (CLL), 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--  ONCOLOGIST-  DR Benay Spice   PT IS ASYMPTOMATIC--- LAST CBC W/ DIFF 06-24-2012 STABLE   Coronary artery disease CARDIOLOGIST- DR Daneen Schick   S/P STENTING LAD 1999 // Myoview 01/2019: EF 62, normal perfusion; Low Risk   Crohn's disease of ileum ([place]) [place]   ED (erectile dysfunction)    Elevated PSA    Essential and other specified forms of tremor 11/25/2012   Gait abnormality 05/17/2020   H/O adenomatous polyp of colon    Hyperlipidemia    Nocturia    OA (osteoarthritis)    Peripheral neuropathy    hx of, none current as of 08-04-13   Peyronie disease    S/P coronary artery stent placement OCT 1999 OF LAD   Tremor, hereditary, benign MILD RIGHT HAND    Past Surgical History:  Procedure Laterality Date   CARDIOVASCULAR STRESS TEST  11-01-2010 DR Daneen Schick   NORMAL PERFUSION STUDY/ EF 64%/ NO ISCHEMIA   cataract surgery  Bilateral feburary 2020   with lens  placement    COLONOSCOPY WITH PROPOFOL N/A 09/24/2012   Procedure: COLONOSCOPY WITH PROPOFOL;  Surgeon: [name], MD;  [facility]: [facility];  Service: Endoscopy;  Laterality: N/A;   CORONARY ANGIOPLASTY WITH STENT PLACEMENT  OCT 1999   STENT OF LAD   CORONARY STENT INTERVENTION N/A 02/14/2019   Procedure: CORONARY STENT INTERVENTION;  Surgeon: Belva Crome, MD;  Location: [facility];  Service: Cardiovascular;  Laterality: N/A;   CYSTOSCOPY WITH URETHRAL DILATATION  03/12/2017   Procedure: CYSTOSCOPY WITH URETHRAL DILATATION;  Surgeon: Gaynelle Arabian, MD;  Location: [facility];  Service: Orthopedics;;  Ammie Dalton, Resident Assisting   CYSTOSCOPY WITH URETHRAL DILATATION N/A 10/03/2018   Procedure: CYSTOSCOPY WITH URETHRAL BALLOON DILATATION WITH BILATERAL RETROGRADE PYELOGRAPHY;  Surgeon: Raynelle Bring, MD;  Location: [facility];  Service: Urology;  Laterality: N/A;   LAPAROSCOPIC INGUINAL HERNIA REPAIR Bilateral 01-10-2004   W/ MESH   LEFT HEART CATH AND CORONARY ANGIOGRAPHY N/A 02/13/2019   Procedure: LEFT HEART CATH AND CORONARY ANGIOGRAPHY;  Surgeon: Belva Crome, MD;  Location: [facility];  Service: Cardiovascular;  Laterality: N/A;   neck benign removed from neck  yrs ago   PROSTATE BIOPSY N/A 07/12/2012   Procedure: PROSTATE BIOPSY AND ULTRASOUND;  Surgeon: Shane Crutch  Gaynelle Arabian, MD;  Location: [facility];  Service: Urology;  Laterality: N/A;   PROSTATE SURGERY  2002   tuna   REMOVAL LEFT NECK LYMPH NODE  1996   TONSILLECTOMY  CHILD   TOTAL KNEE ARTHROPLASTY Right 03/12/2017   Procedure: RIGHT TOTAL KNEE ARTHROPLASTY;  Surgeon: Gaynelle Arabian, MD;  [facility];  Service: Orthopedics;  Laterality: Right;  Adductor Block   TRANSURETHRAL RESECTION OF PROSTATE N/A 08/08/2013   Procedure: TRANSURETHRAL RESECTION OF THE PROSTATE WITH GYRUS INSTRUMENTS;  Surgeon: Ailene Rud, MD;  [facility]: [facility];  Service: Urology;  Laterality: N/A;   TRANSURETHRAL  RESECTION OF PROSTATE      There were no vitals filed for this visit.   Subjective Assessment - 12/14/20 1108     Subjective Pt states that he went to the gym this morning and he noticed he was unsteady for a little while following. He is doing his HEP and was able to try the leg press and plans to increase weight with this next time.    Currently in Pain? No/denies                               [facility] Adult PT Treatment/Exercise - 12/14/20 0001       Knee/Hip Exercises: Sidelying   Clams 2x10 reps   yellow TB   Other Sidelying Knee/Hip Exercises hip abduction 3x5 reps                 Balance Exercises - 12/14/20 0001       Balance Exercises: Standing   Standing Eyes Opened Narrow base of support (BOS);Head turns;2 reps;20 secs    Standing Eyes Closed Narrow base of support (BOS);Foam/compliant surface;3 reps;20 secs    Other Standing Exercises balance stepping reactions to 4 colors 2x15 reps CGA    Other Standing Exercises Comments standing with foot propped on 6" step with lift off 5x3 sec each                PT Education - 12/14/20 1141     Education Details technique with therex; updates to HEP    Person(s) Educated Patient    Methods Explanation;Handout    Comprehension Verbalized understanding              PT Short Term Goals - 11/24/20 1151       PT SHORT TERM GOAL #1   Title Pt will be independent with his initial HEP to improve strength and balance.    Status Achieved               PT Long Term Goals - 11/24/20 1152       PT LONG TERM GOAL #1   Baseline --                   Plan - 12/14/20 1149     Clinical Impression Statement Pt has been working on his HEP as of lately and denies any issues with this. Pt is self-progressing his leg press resistance. Session focused initially on increasing glute strength. Pt has significant glute med weakness noted during sidelying hip abduction and required PT  tactile cuing to improve his technique and avoid hip flexion compensations. Pt required PT CGA up to MinA with single leg lift offs but was able to hold up to 3 seconds consecutively. Will continue with current POC.    PT Frequency 2x / week  PT Duration 8 weeks    PT Treatment/Interventions ADLs/Self Care Home Management;Gait training;Functional mobility training;Therapeutic activities;Therapeutic exercise;Neuromuscular re-education;Balance training;Stair training;Patient/family education;Manual techniques;Passive range of motion;Aquatic Therapy    PT Next Visit Plan continue to work on hip strength, functional strength, counseling on gym machines, Pt may want several aquatic sessions in a few weeks    PT Home Exercise Plan Access Code: [email or web address]    Consulted and Agree with Plan of Care Patient             Patient will benefit from skilled therapeutic intervention in order to improve the following deficits and impairments:  Abnormal gait, Decreased balance, Difficulty walking, Decreased endurance, Decreased strength, Decreased safety awareness, Impaired flexibility  Visit Diagnosis: Muscle weakness (generalized)  Unsteadiness on feet     Problem List Patient Active Problem List   Diagnosis Date Noted   Adult attention deficit disorder 06/04/2020   Chronic lymphocytic leukemia ([place]) 06/04/2020   Conductive hearing loss, bilateral 06/04/2020   Crohn's ileitis ([place]) 06/04/2020   Disorder of musculoskeletal system 06/04/2020   Erectile dysfunction 06/04/2020   Gastroesophageal reflux disease 06/04/2020   Gout 06/04/2020   History of adenomatous polyp of colon 06/04/2020   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   Hypothyroidism 06/04/2020   Increased frequency of urination 06/04/2020   Male hypogonadism 06/04/2020   Muscle pain 06/04/2020   Nocturia 06/04/2020   Obesity 06/04/2020   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   Osteopenia 06/04/2020   Peripheral  neurogenic pain 06/04/2020   Pure hypercholesterolemia 06/04/2020   Recurrent falls 06/04/2020   Unspecified kyphosis, thoracic region 06/04/2020   Vitamin B12 deficiency 06/04/2020   Vitamin D deficiency 06/04/2020   Gait abnormality 05/17/2020   Ataxia 03/16/2020   Angina pectoris ([place]) 02/13/2019   Pseudophakia of both eyes 08/01/2018   History of total knee arthroplasty 03/29/2017   Stiffness of right knee 03/16/2017   OA (osteoarthritis) of knee 03/12/2017   Tremor, essential 12/03/2015   Essential hypertension 12/09/2014   Coronary artery disease involving native heart 12/08/2013   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

## 2020-12-14 NOTE — Telephone Encounter (Signed)
   [facility] Pre-operative Risk Assessment    Patient Name: Cole Martinez  DOB: [birth year] MRN: [id number]  HEARTCARE STAFF:  - IMPORTANT!!!!!! Under Visit Info/Reason for Call, type in Other and utilize the format Clearance MM/DD/YY or Clearance TBD. Do not use dashes or single digits. - Please review there is not already an duplicate clearance open for this procedure. - If request is for dental extraction, please clarify the # of teeth to be extracted. - If the patient is currently at the dentist's office, call Pre-Op Callback Staff (MA/nurse) to input urgent request.  - If the patient is not currently in the dentist office, please route to the Pre-Op pool.  Request for surgical clearance:  What type of surgery is being performed? CYSTOSCOPY WITH BALLOON DILATATION OF URETHRAL STRICTURE  When is this surgery scheduled? 12/30/2020  What type of clearance is required (medical clearance vs. Pharmacy clearance to hold med vs. Both)? Medical   Are there any medications that need to be held prior to surgery and how long? Stop Plavix 5 day prior,  Will take 81 mg Asprin while he is off the plavix   Practice name and name of physician performing surgery? [facility]   What is the office phone number? [place]   7.   What is the office fax number? [phone]  8.   Anesthesia type (None, local, MAC, general) ? General    Shana A Stovall 12/14/2020, 12:45 PM  _________________________________________________________________   (provider comments below)

## 2020-12-15 ENCOUNTER — Telehealth: Payer: Self-pay

## 2020-12-15 NOTE — Telephone Encounter (Signed)
TC to [facility] Per Dr Benay Spice to inquire when Pt would be scheduled for cystoscopy. Spoke with Marlowe Kays surgery [phone] Pt. Scheduled for procedure on 12/30/20. Dr Benay Spice informed.

## 2020-12-15 NOTE — Telephone Encounter (Signed)
I called and s/w the pt and he is agreeable ot plan of care for an appt for pre op clearance. Pt has been scheduled to see Dr. Tamala Julian 12/17/20 @ 9:20. I will forward notes to MD for upcoming appt. Will send FYI to requesting office pt has appt 12/17/20.

## 2020-12-15 NOTE — Telephone Encounter (Signed)
   Name: Cole Martinez  DOB: 01/19/1946  MRN: [id number]  Primary Cardiologist: Sinclair Grooms, MD  Chart reviewed as part of pre-operative protocol coverage. Because of [name] past medical history and time since last visit, he will require a follow-up visit in order to better assess preoperative cardiovascular risk.  Pre-op covering staff: - Please schedule appointment and call patient to inform them. If patient already had an upcoming appointment within acceptable timeframe, please add "pre-op clearance" to the appointment notes so provider is aware. - Please contact requesting surgeon's office via preferred method (i.e, phone, fax) to inform them of need for appointment prior to surgery.  If applicable, this message will also be routed to pharmacy pool and/or primary cardiologist for input on holding anticoagulant/antiplatelet agent as requested below so that this information is available to the clearing provider at time of patient's appointment.   Since he will need at least 5-days hold of Plavix prior to the procedure, will need to arrange follow-up appointment on or before 12/24/2020  [name], [name]  12/15/2020, 2:42 PM

## 2020-12-16 NOTE — Progress Notes (Signed)
Cardiology Office Note:    Date:  12/17/2020   ID:  Cole Martinez, DOB 12-14-1945, MRN [id number]  PCP:  Josetta Huddle, MD  Cardiologist:  Sinclair Grooms, MD   Referring MD: Josetta Huddle, MD   Chief Complaint  Patient presents with   Coronary Artery Disease   Advice Only    Preoperative clearance    History of Present Illness:    Cole Martinez is a 75 y.o. male with a hx of CAD with LAD stent 1999,, hyperlipidemia, hypertension, obesity and recurrent angina leading to DES RCA 02/2019. Residual LAD ISR being treated medically.  He is remaining physically active.  He denies angina.  He complains of easy bruisability on clopidogrel.  He denies orthopnea, PND, palpitations, and edema.  He goes to the gym at least 2 days/week walks his dog every day.  He has not noticed any change in exertional tolerance.  Preoperative clearance for urethral stricture dilatation with Dr. Alinda Money.  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--  ONCOLOGIST-  DR [facility]   PT IS ASYMPTOMATIC--- LAST CBC W/ DIFF 06-24-2012 STABLE   Coronary artery disease CARDIOLOGIST- DR Daneen Schick   S/P STENTING LAD 1999 // Myoview 01/2019: EF 62, normal perfusion; Low Risk   Crohn's disease of ileum ([place]) [place]   ED (erectile dysfunction)    Elevated PSA    Essential and other specified forms of tremor 11/25/2012   Gait abnormality 05/17/2020   H/O adenomatous polyp of colon    Hyperlipidemia    Nocturia    OA (osteoarthritis)    Peripheral neuropathy    hx of, none current as of 08-04-13   Peyronie disease    S/P coronary artery stent placement OCT 1999 OF LAD   Tremor, hereditary, benign MILD RIGHT HAND    Past Surgical History:  Procedure Laterality Date   CARDIOVASCULAR STRESS TEST  11-01-2010 DR Daneen Schick   NORMAL PERFUSION STUDY/  EF 64%/ NO ISCHEMIA   cataract surgery  Bilateral feburary 2020   with lens placement    COLONOSCOPY WITH PROPOFOL N/A 09/24/2012   Procedure: COLONOSCOPY WITH PROPOFOL;  Surgeon: [name], MD;  [facility];  Service: Endoscopy;  Laterality: N/A;   CORONARY ANGIOPLASTY WITH STENT PLACEMENT  OCT 1999   STENT OF LAD   CORONARY STENT INTERVENTION N/A 02/14/2019   Procedure: CORONARY STENT INTERVENTION;  Surgeon: Belva Crome, MD;  Location: [facility];  Service: Cardiovascular;  Laterality: N/A;   CYSTOSCOPY WITH URETHRAL DILATATION  03/12/2017   Procedure: CYSTOSCOPY WITH URETHRAL DILATATION;  Surgeon: Gaynelle Arabian, MD;  Location[facility];  Service: Orthopedics;;  Ammie Dalton, Resident Assisting   CYSTOSCOPY WITH URETHRAL DILATATION N/A 10/03/2018   Procedure: CYSTOSCOPY WITH URETHRAL BALLOON DILATATION WITH BILATERAL RETROGRADE PYELOGRAPHY;  Surgeon: Raynelle Bring, MD;  Location: [facility];  Service: Urology;  Laterality: N/A;   LAPAROSCOPIC INGUINAL HERNIA REPAIR Bilateral 01-10-2004   W/ MESH   LEFT HEART CATH AND CORONARY ANGIOGRAPHY N/A 02/13/2019   Procedure: LEFT HEART CATH AND CORONARY ANGIOGRAPHY;  Surgeon: Belva Crome, MD;  Location: [facility];  Service: Cardiovascular;  Laterality: N/A;   neck benign removed from neck  yrs ago   PROSTATE BIOPSY N/A 07/12/2012   Procedure: PROSTATE BIOPSY AND ULTRASOUND;  Surgeon: Pierre Bali  Joya San, MD;  Location: [facility];  Service: Urology;  Laterality: N/A;   PROSTATE SURGERY  2002   tuna   REMOVAL LEFT NECK LYMPH NODE  1996   TONSILLECTOMY  CHILD   TOTAL KNEE ARTHROPLASTY Right 03/12/2017   Procedure: RIGHT TOTAL KNEE ARTHROPLASTY;  Surgeon: Gaynelle Arabian, MD;  [facility];  Service: Orthopedics;  Laterality: Right;  Adductor Block   TRANSURETHRAL RESECTION OF PROSTATE N/A 08/08/2013   Procedure: TRANSURETHRAL RESECTION OF THE PROSTATE WITH GYRUS INSTRUMENTS;  Surgeon: Ailene Rud,  MD;  Location[facility];  Service: Urology;  Laterality: N/A;   TRANSURETHRAL RESECTION OF PROSTATE      Current Medications: Current Meds  Medication Sig   acetaminophen (TYLENOL) 500 MG tablet Take 1,000 mg by mouth at bedtime as needed for mild pain (joint pain).    amphetamine-dextroamphetamine (ADDERALL) 5 MG tablet Take 1 tablet by mouth twice daily (Patient taking differently: Take 5 mg by mouth every morning.)   aspirin EC 81 MG tablet Take 1 tablet (81 mg total) by mouth daily. Swallow whole.   atorvastatin (LIPITOR) 20 MG tablet TAKE 1 TABLET BY MOUTH IN  THE MORNING   calcium citrate (CALCITRATE - DOSED IN MG ELEMENTAL CALCIUM) 950 (200 Ca) MG tablet Take 200 mg of elemental calcium by mouth daily.   Cholecalciferol 25 MCG (1000 UT) tablet Take 1,000 Units by mouth daily.   Coenzyme Q10 (CO Q-10) 200 MG CAPS Take 200 mg by mouth daily.    Cyanocobalamin (VITAMIN B-12) 2500 MCG SUBL Place 2,500 mcg under the tongue daily.   diclofenac Sodium (VOLTAREN) 1 % GEL Apply 1 application topically 4 (four) times daily as needed (pain).   famotidine (PEPCID) 20 MG tablet Take 20 mg by mouth every morning.   metoprolol succinate (TOPROL-XL) 25 MG 24 hr tablet TAKE 1 TABLET BY MOUTH  DAILY   pantoprazole (PROTONIX) 40 MG tablet Take 1 tablet (40 mg total) by mouth daily. Pt needs to make appt with provider for a 1year supply - 1st attempt (Patient taking differently: Take 40 mg by mouth at bedtime. Pt needs to make appt with provider for a 1year supply - 1st attempt)   primidone (MYSOLINE) 50 MG tablet Take 1 tablet (50 mg total) by mouth at bedtime.   [DISCONTINUED] clopidogrel (PLAVIX) 75 MG tablet TAKE 1 TABLET BY MOUTH  DAILY     Allergies:   Patient has no known allergies.   Social History   Socioeconomic History   Marital status: Married    Spouse name: [name]   Number of children: 3   Years of education: 16   Highest education level: Bachelor's degree (e.g., BA, AB, BS)   Occupational History   Occupation: part time    Comment: self employed Engineer, maintenance (IT)  Tobacco Use   Smoking status: Former    Packs/day: 1.00    Years: 10.00    Pack years: 10.00    Types: Cigarettes    Quit date: 07/09/1984    Years since quitting: 36.4   Smokeless tobacco: Never  Vaping Use   Vaping Use: Never used  Substance and Sexual Activity   Alcohol use: Yes    Alcohol/week: 2.0 standard drinks    Types: 2 Standard drinks or equivalent per week    Comment: daily 1 per day ,    Drug use: No   Sexual activity: Not on file  Other Topics Concern   Not on file  Social History Narrative  Lives at home with is wife, [name].     Right handed   Drinks 2-3 cups coffee daily   Social Determinants of Health   Financial Resource Strain: Not on file  Food Insecurity: Not on file  Transportation Needs: Not on file  Physical Activity: Not on file  Stress: Not on file  Social Connections: Not on file     Family History: The patient's family history includes Diabetes in his brother; Obesity in his brother; Parkinsonism in his brother.  ROS:   Please see the history of present illness.    Decreased balance.  In physical therapy.  Easy bruising.  Increased urinary frequency.  All other systems reviewed and are negative.  EKGs/Labs/Other Studies Reviewed:    The following studies were reviewed today: No cardiac data since last office visit  EKG:  EKG sinus bradycardia, biatrial abnormality, probable left ventricular hypertrophy.  Paired to March 2021, no significant changes noted with the exception that the QRS axis is slightly more rightward now than previous.  Recent Labs: 12/13/2020: ALT 13; BUN 18; Creatinine 0.98; Hemoglobin 15.4; Platelet Count 224; Potassium 4.8; Sodium 136  Recent Lipid Panel    Component Value Date/Time   CHOL 160 11/19/2019 1002   TRIG 126 11/19/2019 1002   HDL 49 11/19/2019 1002   CHOLHDL 3.3 11/19/2019 1002   CHOLHDL 3.3 12/14/2015 0806   VLDL 24  12/14/2015 0806   LDLCALC 89 11/19/2019 1002    Physical Exam:    VS:  BP 140/88   Pulse 60   Ht 5' 8"  (1.727 m)   Wt 180 lb 12.8 oz (82 kg)   SpO2 100%   BMI 27.49 kg/m     Wt Readings from Last 3 Encounters:  12/17/20 180 lb 12.8 oz (82 kg)  12/13/20 182 lb (82.6 kg)  10/25/20 181 lb 2 oz (82.2 kg)     GEN: Overweight. No acute distress HEENT: Normal NECK: No JVD. LYMPHATICS: No lymphadenopathy CARDIAC: No murmur. RRR no gallop, or edema. VASCULAR:  Normal Pulses. No bruits. RESPIRATORY:  Clear to auscultation without rales, wheezing or rhonchi  ABDOMEN: Soft, non-tender, non-distended, No pulsatile mass, MUSCULOSKELETAL: Kyphoscoliosis. SKIN: Warm and dry NEUROLOGIC:  Alert and oriented x 3.  Noticeable left hand and arm and hand tremor PSYCHIATRIC:  Normal affect   ASSESSMENT:    1. Preoperative clearance   2. Coronary artery disease involving native coronary artery of native heart with angina pectoris ([place])   3. Essential hypertension   4. Mixed hyperlipidemia   5. Memory deficit   6. Stricture of male urethra, unspecified stricture type    PLAN:    In order of problems listed above:  Cleared for the upcoming urologic procedure.  Okay to hold/DC Plavix 7 days prior.  Patient states that Dr. Alinda Money is okay with him taking a coated aspirin 81 mg/day.  Patient prefers to stay on aspirin rather than Plavix because of perceived less bruising. No new cardiac symptoms.  Secondary prevention reviewed. Excellent blood pressure control on current therapy.   Continue statin therapy. Did not discuss Did not discuss other than in the context of preoperative evaluation.    Overall education and awareness concerning primary/secondary risk prevention was discussed in detail: LDL less than 70, hemoglobin A1c less than 7, blood pressure target less than 130/80 mmHg, >150 minutes of moderate aerobic activity per week, avoidance of smoking, weight control (via diet and  exercise), and continued surveillance/management of/for obstructive sleep apnea.    Medication  Adjustments/Labs and Tests Ordered: Current medicines are reviewed at length with the patient today.  Concerns regarding medicines are outlined above.  Orders Placed This Encounter  Procedures   EKG 12-Lead   Meds ordered this encounter  Medications   aspirin EC 81 MG tablet    Sig: Take 1 tablet (81 mg total) by mouth daily. Swallow whole.    Dispense:  90 tablet    Refill:  3    Patient Instructions  Medication Instructions:  1) You may STOP Plavix 1 week prior your procedure, then START Aspirin 80m once daily and stay on that.  *If you need a refill on your cardiac medications before your next appointment, please call your pharmacy*   Lab Work: None If you have labs (blood work) drawn today and your tests are completely normal, you will receive your results only by: [place](if you have MyChart) OR A paper copy in the mail If you have any lab test that is abnormal or we need to change your treatment, we will call you to review the results.   Testing/Procedures: None   Follow-Up: [facility] and your [facility] needs [facility] our [facility].  As part of our continuing mission to provide you with exceptional heart care, we have created designated Provider Care Teams.  These Care Teams include your primary Cardiologist (physician) and Advanced Practice Providers (APPs -  Physician Assistants and Nurse Practitioners) who all work together to provide you with the care you need, when you need it.  We recommend signing up for the patient portal called "MyChart".  Sign up information is provided on this After Visit Summary.  MyChart is used to connect with patients for Virtual Visits (Telemedicine).  Patients are able to view lab/test results, encounter notes, upcoming appointments, etc.  Non-urgent messages can be sent to your provider as well.   To learn more about what you can  do with MyChart, go to [email or web address]    Your next appointment:   1 year(s)  The format for your next appointment:   In Person  Provider:   You may see HSinclair Grooms MD or one of the following Advanced Practice Providers on your designated Care Team:   LCecilie Kicks NP   Other Instructions     Signed, HSinclair Grooms MD  12/17/2020 10:04 AM    [place]

## 2020-12-17 ENCOUNTER — Ambulatory Visit: Payer: Medicare Other | Admitting: Physical Therapy

## 2020-12-17 ENCOUNTER — Encounter: Payer: Self-pay | Admitting: Physical Therapy

## 2020-12-17 ENCOUNTER — Other Ambulatory Visit: Payer: Self-pay

## 2020-12-17 ENCOUNTER — Encounter: Payer: Self-pay | Admitting: Interventional Cardiology

## 2020-12-17 ENCOUNTER — Ambulatory Visit (INDEPENDENT_AMBULATORY_CARE_PROVIDER_SITE_OTHER): Payer: Medicare Other | Admitting: Interventional Cardiology

## 2020-12-17 ENCOUNTER — Encounter: Payer: Medicare Other | Admitting: Physical Therapy

## 2020-12-17 VITALS — BP 140/88 | HR 60 | Ht 68.0 in | Wt 180.8 lb

## 2020-12-17 DIAGNOSIS — M6281 Muscle weakness (generalized): Secondary | ICD-10-CM | POA: Diagnosis not present

## 2020-12-17 DIAGNOSIS — R2681 Unsteadiness on feet: Secondary | ICD-10-CM

## 2020-12-17 DIAGNOSIS — E782 Mixed hyperlipidemia: Secondary | ICD-10-CM

## 2020-12-17 DIAGNOSIS — Z01818 Encounter for other preprocedural examination: Secondary | ICD-10-CM | POA: Diagnosis not present

## 2020-12-17 DIAGNOSIS — R293 Abnormal posture: Secondary | ICD-10-CM | POA: Diagnosis not present

## 2020-12-17 DIAGNOSIS — Z9181 History of falling: Secondary | ICD-10-CM | POA: Diagnosis not present

## 2020-12-17 DIAGNOSIS — R413 Other amnesia: Secondary | ICD-10-CM | POA: Diagnosis not present

## 2020-12-17 DIAGNOSIS — R2689 Other abnormalities of gait and mobility: Secondary | ICD-10-CM

## 2020-12-17 DIAGNOSIS — N35919 Unspecified urethral stricture, male, unspecified site: Secondary | ICD-10-CM | POA: Diagnosis not present

## 2020-12-17 DIAGNOSIS — I10 Essential (primary) hypertension: Secondary | ICD-10-CM

## 2020-12-17 DIAGNOSIS — I25119 Atherosclerotic heart disease of native coronary artery with unspecified angina pectoris: Secondary | ICD-10-CM | POA: Diagnosis not present

## 2020-12-17 MED ORDER — ASPIRIN EC 81 MG PO TBEC: 81.0000 mg | DELAYED_RELEASE_TABLET | Freq: Every day | ORAL | 3 refills | Status: AC

## 2020-12-17 NOTE — Patient Instructions (Signed)
Medication Instructions:  1) You may STOP Plavix 1 week prior your procedure, then START Aspirin 6m once daily and stay on that.  *If you need a refill on your cardiac medications before your next appointment, please call your pharmacy*   Lab Work: None If you have labs (blood work) drawn today and your tests are completely normal, you will receive your results only by: MNome(if you have MyChart) OR A paper copy in the mail If you have any lab test that is abnormal or we need to change your treatment, we will call you to review the results.   Testing/Procedures: None   Follow-Up: At [facility] you and your health needs are our priority.  As part of our continuing mission to provide you with exceptional heart care, we have created designated Provider Care Teams.  These Care Teams include your primary Cardiologist (physician) and Advanced Practice Providers (APPs -  Physician Assistants and Nurse Practitioners) who all work together to provide you with the care you need, when you need it.  We recommend signing up for the patient portal called "MyChart".  Sign up information is provided on this After Visit Summary.  MyChart is used to connect with patients for Virtual Visits (Telemedicine).  Patients are able to view lab/test results, encounter notes, upcoming appointments, etc.  Non-urgent messages can be sent to your provider as well.   To learn more about what you can do with MyChart, go to [email or web address]    Your next appointment:   1 year(s)  The format for your next appointment:   In Person  Provider:   You may see HSinclair Grooms MD or one of the following Advanced Practice Providers on your designated Care Team:   LCecilie Kicks NP   Other Instructions

## 2020-12-17 NOTE — Therapy (Signed)
[facility] @ [place], [place], [address] Phone: [phone]   Fax:  [phone]  Physical Therapy Treatment  Patient Details  Name: Cole Martinez MRN: [id number] Date of Birth: December 09, 1945 Referring Provider (PT): Margette Fast, MD   Encounter Date: 12/17/2020   PT End of Session - 12/17/20 0805     Visit Number 7    Date for PT Re-Evaluation 01/10/21    Authorization Type Medicare A and B    Authorization Time Period 11/15/20 to 01/10/21    Authorization - Visit Number 7    Progress Note Due on Visit 10    PT Start Time 0801    PT Stop Time 4196    PT Time Calculation (min) 53 min    Activity Tolerance Patient tolerated treatment well;No increased pain    Behavior During Therapy WFL for tasks assessed/performed             Past Medical History:  Diagnosis Date   ADHD (attention deficit hyperactivity disorder)    B12 deficiency    BPH (benign prostatic hypertrophy)    Chronic lymphocytic leukemia (CLL), 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--  ONCOLOGIST-  DR Benay Spice   PT IS ASYMPTOMATIC--- LAST CBC W/ DIFF 06-24-2012 STABLE   Coronary artery disease CARDIOLOGIST- DR Daneen Schick   S/P STENTING LAD 1999 // Myoview 01/2019: EF 62, normal perfusion; Low Risk   Crohn's disease of ileum ([place]) [place]   ED (erectile dysfunction)    Elevated PSA    Essential and other specified forms of tremor 11/25/2012   Gait abnormality 05/17/2020   H/O adenomatous polyp of colon    Hyperlipidemia    Nocturia    OA (osteoarthritis)    Peripheral neuropathy    hx of, none current as of 08-04-13   Peyronie disease    S/P coronary artery stent placement OCT 1999 OF LAD   Tremor, hereditary, benign MILD RIGHT HAND    Past Surgical History:  Procedure Laterality Date   CARDIOVASCULAR STRESS TEST  11-01-2010 DR Daneen Schick   NORMAL PERFUSION STUDY/ EF 64%/ NO ISCHEMIA   cataract surgery  Bilateral feburary 2020   with lens  placement    COLONOSCOPY WITH PROPOFOL N/A 09/24/2012   Procedure: COLONOSCOPY WITH PROPOFOL;  Surgeon: [name], MD;  [facility];  Service: Endoscopy;  Laterality: N/A;   CORONARY ANGIOPLASTY WITH STENT PLACEMENT  OCT 1999   STENT OF LAD   CORONARY STENT INTERVENTION N/A 02/14/2019   Procedure: CORONARY STENT INTERVENTION;  Surgeon: Belva Crome, MD;  Location: [facility];  Service: Cardiovascular;  Laterality: N/A;   CYSTOSCOPY WITH URETHRAL DILATATION  03/12/2017   Procedure: CYSTOSCOPY WITH URETHRAL DILATATION;  Surgeon: Gaynelle Arabian, MD;  [facility];  Service: Orthopedics;;  Ammie Dalton, Resident Assisting   CYSTOSCOPY WITH URETHRAL DILATATION N/A 10/03/2018   Procedure: CYSTOSCOPY WITH URETHRAL BALLOON DILATATION WITH BILATERAL RETROGRADE PYELOGRAPHY;  Surgeon: Raynelle Bring, MD;  [facility];  Service: Urology;  Laterality: N/A;   LAPAROSCOPIC INGUINAL HERNIA REPAIR Bilateral 01-10-2004   W/ MESH   LEFT HEART CATH AND CORONARY ANGIOGRAPHY N/A 02/13/2019   Procedure: LEFT HEART CATH AND CORONARY ANGIOGRAPHY;  Surgeon: Belva Crome, MD;  Location: [facility];  Service: Cardiovascular;  Laterality: N/A;   neck benign removed from neck  yrs ago   PROSTATE BIOPSY N/A 07/12/2012   Procedure: PROSTATE BIOPSY AND ULTRASOUND;  Surgeon: Shane Crutch  Gaynelle Arabian, MD;  Location: [facility];  Service: Urology;  Laterality: N/A;   PROSTATE SURGERY  2002   tuna   REMOVAL LEFT NECK LYMPH NODE  1996   TONSILLECTOMY  CHILD   TOTAL KNEE ARTHROPLASTY Right 03/12/2017   Procedure: RIGHT TOTAL KNEE ARTHROPLASTY;  Surgeon: Gaynelle Arabian, MD;  [facility];  Service: Orthopedics;  Laterality: Right;  Adductor Block   TRANSURETHRAL RESECTION OF PROSTATE N/A 08/08/2013   Procedure: TRANSURETHRAL RESECTION OF THE PROSTATE WITH GYRUS INSTRUMENTS;  Surgeon: Ailene Rud, MD;  [facility];  Service: Urology;  Laterality: N/A;   TRANSURETHRAL  RESECTION OF PROSTATE      There were no vitals filed for this visit.   Subjective Assessment - 12/17/20 0806     Subjective I noticed walking my dogs this AM my balance was better.    Currently in Pain? No/denies    Multiple Pain Sites No                               [facility] Adult PT Treatment/Exercise - 12/17/20 0001       Knee/Hip Exercises: Aerobic   Recumbent Bike L4 x 10 min with concurrent discussion of status      Knee/Hip Exercises: Machines for Strengthening   Total Gym Leg Press Seat 7: 85#  15x, Single leg 40# 15x Bil      Knee/Hip Exercises: Standing   Forward Step Up Both;1 set;15 reps;Hand Hold: 1    Forward Step Up Limitations Skipping a step    Other Standing Knee Exercises red band above knees: standing static hip hinge with static hip abd 20 sec 3x    Other Standing Knee Exercises side stepping in front of the mirror with red band distal knee 4 lengths, CGA      Knee/Hip Exercises: Supine   Other Supine Knee/Hip Exercises Manually resisted hip abduction 10x, isometric Lt hip abd given for home      Knee/Hip Exercises: Sidelying   Other Sidelying Knee/Hip Exercises pt could not fire glute medius with leg straight or knee bent:                     PT Education - 12/17/20 0851     Education Details HEP; pt can choose supine or seated Lt hip glute medius isometric ex, discussed doing whatever was easier for him to do at home    Person(s) Educated Patient    Methods Explanation;Demonstration;Tactile cues;Verbal cues;Handout    Comprehension Returned demonstration;Verbalized understanding              PT Short Term Goals - 11/24/20 1151       PT SHORT TERM GOAL #1   Title Pt will be independent with his initial HEP to improve strength and balance.    Status Achieved               PT Long Term Goals - 11/24/20 1152       PT LONG TERM GOAL #1   Baseline --                   Plan - 12/17/20 0810      Clinical Impression Statement Pt reports when walking dogs this AM his balance was better. He felt he did not "wobble " as much. Pt cannot fire Lt glute medius very well, tends to heavily use hip flexors. Pt ws given  isometric hip abduciton to work on at home to get glute medius firing more efficiently.    Personal Factors and Comorbidities Age;Time since onset of injury/illness/exacerbation    Examination-Activity Limitations Locomotion Level;Stairs    Examination-Participation Restrictions Community Activity    Stability/Clinical Decision Making Stable/Uncomplicated    Rehab Potential Good    PT Frequency 2x / week    PT Duration 8 weeks    PT Treatment/Interventions ADLs/Self Care Home Management;Gait training;Functional mobility training;Therapeutic activities;Therapeutic exercise;Neuromuscular re-education;Balance training;Stair training;Patient/family education;Manual techniques;Passive range of motion;Aquatic Therapy    PT Next Visit Plan Follow up on Lt glute strength what is working best at home for pt: can he get Lt glute medius firing? pt will look to schedule a few pool sessions for Novemeber    PT Home Exercise Plan Access Code: [email or web address]    [email or web address] and Agree with Plan of Care Patient             Patient will benefit from skilled therapeutic intervention in order to improve the following deficits and impairments:  Abnormal gait, Decreased balance, Difficulty walking, Decreased endurance, Decreased strength, Decreased safety awareness, Impaired flexibility  Visit Diagnosis: Muscle weakness (generalized)  Unsteadiness on feet  Other abnormalities of gait and mobility  Abnormal posture  History of falling     Problem List Patient Active Problem List   Diagnosis Date Noted   Adult attention deficit disorder 06/04/2020   Chronic lymphocytic leukemia ([place]) 06/04/2020   Conductive hearing loss, bilateral 06/04/2020   Crohn's ileitis ([place]) 06/04/2020   Disorder of  musculoskeletal system 06/04/2020   Erectile dysfunction 06/04/2020   Gastroesophageal reflux disease 06/04/2020   Gout 06/04/2020   History of adenomatous polyp of colon 06/04/2020   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   Hypothyroidism 06/04/2020   Increased frequency of urination 06/04/2020   Male hypogonadism 06/04/2020   Muscle pain 06/04/2020   Nocturia 06/04/2020   Obesity 06/04/2020   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   Osteopenia 06/04/2020   Peripheral neurogenic pain 06/04/2020   Pure hypercholesterolemia 06/04/2020   Recurrent falls 06/04/2020   Unspecified kyphosis, thoracic region 06/04/2020   Vitamin B12 deficiency 06/04/2020   Vitamin D deficiency 06/04/2020   Gait abnormality 05/17/2020   Ataxia 03/16/2020   Angina pectoris ([place]) 02/13/2019   Pseudophakia of both eyes 08/01/2018   History of total knee arthroplasty 03/29/2017   Stiffness of right knee 03/16/2017   OA (osteoarthritis) of knee 03/12/2017   Tremor, essential 12/03/2015   Essential hypertension 12/09/2014   Coronary artery disease involving native heart 12/08/2013   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   Hyperlipidemia 12/08/2013   Benign prostatic hypertrophy 08/08/2013   Essential and other specified forms of tremor 11/25/2012    Eyal Greenhaw, PTA 12/17/2020, 8:56 AM  [facility] @ [place], [place], [address] Phone: [phone]   Fax:  [phone]  Name: Cole Martinez MRN: [id number] Date of Birth: 1945/12/27  <HTML><META HTTP-EQUIV="content-type" CONTENT="text/html;charset=utf-8"> <STRONG class="ng-star-inserted" _ngcontent-brw-c51="">Access Code:  [email or web address]</STRONG><BR class="ng-star-inserted" _ngcontent-brw-c51=""><STRONG  class="ng-star-inserted" _ngcontent-brw-c51="">URL:  [email or web address]</STRONG><BR class="ng-star-inserted"  _ngcontent-brw-c51=""><STRONG  class="ng-star-inserted"  _ngcontent-brw-c51="">Date: 12/17/2020</STRONG><BR class="ng-star-inserted"  _ngcontent-brw-c51=""><STRONG _ngcontent-brw-c51="">Prepared by: Anderson Malta  Delorese Sellin</STRONG><BR _ngcontent-brw-c51=""><BR _ngcontent-brw-c51=""><SPAN class="ng-star-inserted"  _ngcontent-brw-c51="">Exercises</SPAN><BR class="ng-star-inserted"  _ngcontent-brw-c51=""> <UL class="ng-star-inserted" _ngcontent-brw-c51=""> <LI class="ng-star-inserted" _ngcontent-brw-c51="">Beginner Bridge  - 1 x daily  - 7 x weekly - 3 sets - 10 reps</LI> <LI class="ng-star-inserted" _ngcontent-brw-c51="">Clam with Resistance  - 1 x  daily - 7 x weekly - 3 sets - 10 reps</LI> <LI class="ng-star-inserted" _ngcontent-brw-c51="">Clam with Resistance  (  Mirrored)  - 1 x daily - 7 x weekly - 3 sets - 10 reps</LI> <LI class="ng-star-inserted" _ngcontent-brw-c51="">Sit to Stand with Resistance  Around Legs  - 1 x daily - 7 x weekly - 2 sets - 10 reps</LI> <LI class="ng-star-inserted" _ngcontent-brw-c51="">Seated Hip Flexion March with  Ankle Weights  - 1 x daily - 7 x weekly - 2 sets - 20 reps</LI> <LI class="ng-star-inserted" _ngcontent-brw-c51="">Lateral Step Up with Counter  Support  - 1 x daily - 7 x weekly - 2 sets - 10 reps</LI> <LI class="ng-star-inserted" _ngcontent-brw-c51="">Forward Step Up with  Unilateral Counter Support  - 1 x daily - 7 x weekly - 2 sets - 10 reps</LI> <LI class="ng-star-inserted" _ngcontent-brw-c51="">Long Sitting Isometric Hip  Abduction with Ball at [employer]  - 2 x daily - 7 x weekly - 1 sets - 10 reps - 5  hold</LI> <LI class="ng-star-inserted" _ngcontent-brw-c51="">Seated Isometric Hip  Abduction  - 2 x daily - 7 x weekly - 1 sets - 10 reps - 5 hold</LI></UL><BR  class="ng-star-inserted" _ngcontent-brw-c51="">

## 2020-12-21 ENCOUNTER — Ambulatory Visit: Payer: Medicare Other | Admitting: Rehabilitative and Restorative Service Providers"

## 2020-12-21 ENCOUNTER — Encounter: Payer: Self-pay | Admitting: Rehabilitative and Restorative Service Providers"

## 2020-12-21 ENCOUNTER — Other Ambulatory Visit: Payer: Self-pay

## 2020-12-21 DIAGNOSIS — Z9181 History of falling: Secondary | ICD-10-CM | POA: Diagnosis not present

## 2020-12-21 DIAGNOSIS — M6281 Muscle weakness (generalized): Secondary | ICD-10-CM | POA: Diagnosis not present

## 2020-12-21 DIAGNOSIS — R2689 Other abnormalities of gait and mobility: Secondary | ICD-10-CM | POA: Diagnosis not present

## 2020-12-21 DIAGNOSIS — R2681 Unsteadiness on feet: Secondary | ICD-10-CM | POA: Diagnosis not present

## 2020-12-21 DIAGNOSIS — R293 Abnormal posture: Secondary | ICD-10-CM | POA: Diagnosis not present

## 2020-12-21 NOTE — Therapy (Signed)
[facility] @ [place], [place], [address] Phone: [phone]   Fax:  [phone]  Physical Therapy Treatment  Patient Details  Name: Cole Martinez MRN: [id number] Date of Birth: 02-28-1946 Referring Provider (PT): Margette Fast, MD   Encounter Date: 12/21/2020   PT End of Session - 12/21/20 1103     Visit Number 8    Date for PT Re-Evaluation 01/10/21    Authorization Type Medicare A and B    Authorization Time Period 11/15/20 to 01/10/21    Authorization - Visit Number 8    Progress Note Due on Visit 10    PT Start Time 1100    PT Stop Time 1140    PT Time Calculation (min) 40 min    Activity Tolerance Patient tolerated treatment well;No increased pain    Behavior During Therapy WFL for tasks assessed/performed             Past Medical History:  Diagnosis Date   ADHD (attention deficit hyperactivity disorder)    B12 deficiency    BPH (benign prostatic hypertrophy)    Chronic lymphocytic leukemia (CLL), 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--  ONCOLOGIST-  DR Benay Spice   PT IS ASYMPTOMATIC--- LAST CBC W/ DIFF 06-24-2012 STABLE   Coronary artery disease CARDIOLOGIST- DR Daneen Schick   S/P STENTING LAD 1999 // Myoview 01/2019: EF 62, normal perfusion; Low Risk   Crohn's disease of ileum ([place]) [place]   ED (erectile dysfunction)    Elevated PSA    Essential and other specified forms of tremor 11/25/2012   Gait abnormality 05/17/2020   H/O adenomatous polyp of colon    Hyperlipidemia    Nocturia    OA (osteoarthritis)    Peripheral neuropathy    hx of, none current as of 08-04-13   Peyronie disease    S/P coronary artery stent placement OCT 1999 OF LAD   Tremor, hereditary, benign MILD RIGHT HAND    Past Surgical History:  Procedure Laterality Date   CARDIOVASCULAR STRESS TEST  11-01-2010 DR Daneen Schick   NORMAL PERFUSION STUDY/ EF 64%/ NO ISCHEMIA   cataract surgery  Bilateral feburary 2020   with lens  placement    COLONOSCOPY WITH PROPOFOL N/A 09/24/2012   Procedure: COLONOSCOPY WITH PROPOFOL;  Surgeon: [name], MD;  [facility];  Service: Endoscopy;  Laterality: N/A;   CORONARY ANGIOPLASTY WITH STENT PLACEMENT  OCT 1999   STENT OF LAD   CORONARY STENT INTERVENTION N/A 02/14/2019   Procedure: CORONARY STENT INTERVENTION;  Surgeon: Belva Crome, MD;  Location: [facility];  Service: Cardiovascular;  Laterality: N/A;   CYSTOSCOPY WITH URETHRAL DILATATION  03/12/2017   Procedure: CYSTOSCOPY WITH URETHRAL DILATATION;  Surgeon: Gaynelle Arabian, MD;  [facility];  Service: Orthopedics;;  Ammie Dalton, Resident Assisting   CYSTOSCOPY WITH URETHRAL DILATATION N/A 10/03/2018   Procedure: CYSTOSCOPY WITH URETHRAL BALLOON DILATATION WITH BILATERAL RETROGRADE PYELOGRAPHY;  Surgeon: Raynelle Bring, MD;  [facility];  Service: Urology;  Laterality: N/A;   LAPAROSCOPIC INGUINAL HERNIA REPAIR Bilateral 01-10-2004   W/ MESH   LEFT HEART CATH AND CORONARY ANGIOGRAPHY N/A 02/13/2019   Procedure: LEFT HEART CATH AND CORONARY ANGIOGRAPHY;  Surgeon: Belva Crome, MD;  Location: [facility];  Service: Cardiovascular;  Laterality: N/A;   neck benign removed from neck  yrs ago   PROSTATE BIOPSY N/A 07/12/2012   Procedure: PROSTATE BIOPSY AND ULTRASOUND;  Surgeon: Shane Crutch  Gaynelle Arabian, MD;  Location[facility];  Service: Urology;  Laterality: N/A;   PROSTATE SURGERY  2002   tuna   REMOVAL LEFT NECK LYMPH NODE  1996   TONSILLECTOMY  CHILD   TOTAL KNEE ARTHROPLASTY Right 03/12/2017   Procedure: RIGHT TOTAL KNEE ARTHROPLASTY;  Surgeon: Gaynelle Arabian, MD;  [facility];  Service: Orthopedics;  Laterality: Right;  Adductor Block   TRANSURETHRAL RESECTION OF PROSTATE N/A 08/08/2013   Procedure: TRANSURETHRAL RESECTION OF THE PROSTATE WITH GYRUS INSTRUMENTS;  Surgeon: Ailene Rud, MD;  [facility];  Service: Urology;  Laterality: N/A;   TRANSURETHRAL  RESECTION OF PROSTATE      There were no vitals filed for this visit.   Subjective Assessment - 12/21/20 1102     Subjective The last few new exercises, I am having some problems with    Patient Stated Goals improve steadiness of his gait    Currently in Pain? No/denies                               [facility] Adult PT Treatment/Exercise - 12/21/20 0001       Ambulation/Gait   Gait Comments Ambulation outside over various surfaces with B 2# weight x500 ft      High Level Balance   High Level Balance Comments Side stepping with red tband down PT gym and back x2 laps      Knee/Hip Exercises: Machines for Strengthening   Hip Cybex Hip abd and hip ext 55# x10B      Knee/Hip Exercises: Seated   Clamshell with TheraBand Red   2x10   Other Seated Knee/Hip Exercises isometric hip abd x20 LLE    Sit to Sand without UE support;15 reps   blue tied tband around thighs with yellow wt ball chest press.                      PT Short Term Goals - 11/24/20 1151       PT SHORT TERM GOAL #1   Title Pt will be independent with his initial HEP to improve strength and balance.    Status Achieved               PT Long Term Goals - 12/21/20 1155       PT LONG TERM GOAL #1   Title Pt will have greater than 4/5 MMT strength of BLE which will improve his gait mechanics and balance reactions in standing.    Status On-going      PT LONG TERM GOAL #2   Title Pt will have atleast 6 point increase on the BERG to reflect a significant improvement in his balance and decreased risk of falling.    Status On-going      PT LONG TERM GOAL #3   Title Pt will be independent with a regular/advanced LE strengthening program at his gym in order to maintain progress made during his POC.    Status On-going      PT LONG TERM GOAL #4   Title Pt will score greater than 19 points on the DGI to reflect a decrease in his risk of falling and injuring himself.    Status On-going                    Plan - 12/21/20 1103     Clinical Impression Statement Pt is progressing towards goal related  activities.  He required SBA with side stepping with red tband around ankles secondary to balance challenge. He is able to illicit a larger glute med contraction and reviewed HEP during isometrics. Pt with fatigue following addition of Cybex hip machine, but with good safety.    PT Treatment/Interventions ADLs/Self Care Home Management;Gait training;Functional mobility training;Therapeutic activities;Therapeutic exercise;Neuromuscular re-education;Balance training;Stair training;Patient/family education;Manual techniques;Passive range of motion;Aquatic Therapy    PT Next Visit Plan Follow up on Lt glute strength what is working best at home for pt: can he get Lt glute medius firing? pt will look to schedule a few pool sessions for Novemeber    PT Home Exercise Plan Access Code: [email or web address]    [email or web address] and Agree with Plan of Care Patient             Patient will benefit from skilled therapeutic intervention in order to improve the following deficits and impairments:  Abnormal gait, Decreased balance, Difficulty walking, Decreased endurance, Decreased strength, Decreased safety awareness, Impaired flexibility  Visit Diagnosis: Muscle weakness (generalized)  Unsteadiness on feet  Other abnormalities of gait and mobility  Abnormal posture  History of falling     Problem List Patient Active Problem List   Diagnosis Date Noted   Adult attention deficit disorder 06/04/2020   Chronic lymphocytic leukemia ([place]) 06/04/2020   Conductive hearing loss, bilateral 06/04/2020   Crohn's ileitis ([place]) 06/04/2020   Disorder of musculoskeletal system 06/04/2020   Erectile dysfunction 06/04/2020   Gastroesophageal reflux disease 06/04/2020   Gout 06/04/2020   History of adenomatous polyp of colon 06/04/2020   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   Hypothyroidism  06/04/2020   Increased frequency of urination 06/04/2020   Male hypogonadism 06/04/2020   Muscle pain 06/04/2020   Nocturia 06/04/2020   Obesity 06/04/2020   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   Osteopenia 06/04/2020   Peripheral neurogenic pain 06/04/2020   Pure hypercholesterolemia 06/04/2020   Recurrent falls 06/04/2020   Unspecified kyphosis, thoracic region 06/04/2020   Vitamin B12 deficiency 06/04/2020   Vitamin D deficiency 06/04/2020   Gait abnormality 05/17/2020   Ataxia 03/16/2020   Angina pectoris ([place]) 02/13/2019   Pseudophakia of both eyes 08/01/2018   History of total knee arthroplasty 03/29/2017   Stiffness of right knee 03/16/2017   OA (osteoarthritis) of knee 03/12/2017   Tremor, essential 12/03/2015   Essential hypertension 12/09/2014   Coronary artery disease involving native heart 12/08/2013   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

## 2020-12-21 NOTE — Patient Instructions (Addendum)
DUE TO COVID-19 ONLY ONE VISITOR IS ALLOWED TO COME WITH YOU AND STAY IN THE WAITING ROOM ONLY DURING PRE OP AND PROCEDURE.   **NO VISITORS ARE ALLOWED IN THE SHORT STAY AREA OR RECOVERY ROOM!!**       Your procedure is scheduled on: 12/30/20   Report to [facility] Main Entrance    Report to admitting at 12:15 PM   Call this number if you have problems the morning of surgery [phone]   Follow clear liquid diet day before surgery.   May have small sip of water with medications.  CLEAR LIQUID DIET  Foods Allowed                                                                     Foods Excluded  Water, Black Coffee and tea (no milk or creamer)          liquids that you cannot  Plain Jell-O in any flavor  (No red)                                   see through such as: Fruit ices (not with fruit pulp)                                           milk, soups, orange juice              Iced Popsicles (No red)                                               All solid food                                   Apple juices Sports drinks like Gatorade (No red) Lightly seasoned clear broth or consume(fat free) Sugar  Oral Hygiene is also important to reduce your risk of infection.                                    Remember - BRUSH YOUR TEETH THE MORNING OF SURGERY WITH YOUR REGULAR TOOTHPASTE   Take these medicines the morning of surgery with A SIP OF WATER: Lipitor, Metoprolol                               You may not have any metal on your body including  jewelry, and body piercing             Do not wear lotions, powders, cologne, or deodorant              Men may shave face and neck.   Do not bring valuables to the hospital. [facility] IS NOT  RESPONSIBLE   FOR VALUABLES.    Patients discharged on the day of surgery will not be allowed to drive home.   Please read over the following fact sheets you were given: IF YOU HAVE QUESTIONS ABOUT YOUR PRE-OP  INSTRUCTIONS PLEASE CALL [facility] - Preparing for Surgery Before surgery, you can play an important role.  Because skin is not sterile, your skin needs to be as free of germs as possible.  You can reduce the number of germs on your skin by washing with CHG (chlorahexidine gluconate) soap before surgery.  CHG is an antiseptic cleaner which kills germs and bonds with the skin to continue killing germs even after washing. Please DO NOT use if you have an allergy to CHG or antibacterial soaps.  If your skin becomes reddened/irritated stop using the CHG and inform your nurse when you arrive at Short Stay. Do not shave (including legs and underarms) for at least 48 hours prior to the first CHG shower.  You may shave your face/neck.  Please follow these instructions carefully:  1.  Shower with CHG Soap the night before surgery and the  morning of surgery.  2.  If you choose to wash your hair, wash your hair first as usual with your normal  shampoo.  3.  After you shampoo, rinse your hair and body thoroughly to remove the shampoo.                             4.  Use CHG as you would any other liquid soap.  You can apply chg directly to the skin and wash.  Gently with a scrungie or clean washcloth.  5.  Apply the CHG Soap to your body ONLY FROM THE NECK DOWN.   Do   not use on face/ open                           Wound or open sores. Avoid contact with eyes, ears mouth and   genitals (private parts).                       Wash face,  Genitals (private parts) with your normal soap.             6.  Wash thoroughly, paying special attention to the area where your    surgery  will be performed.  7.  Thoroughly rinse your body with warm water from the neck down.  8.  DO NOT shower/wash with your normal soap after using and rinsing off the CHG Soap.                9.  Pat yourself dry with a clean towel.            10.  Wear clean pajamas.            11.  Place clean sheets on your bed  the night of your first shower and do not  sleep with pets. Day of Surgery : Do not apply any lotions/deodorants the morning of surgery.  Please wear clean clothes to the hospital/surgery center.  FAILURE TO FOLLOW THESE INSTRUCTIONS MAY RESULT IN THE CANCELLATION OF YOUR SURGERY  PATIENT SIGNATURE_________________________________  NURSE SIGNATURE__________________________________  ________________________________________________________________________

## 2020-12-21 NOTE — Progress Notes (Addendum)
COVID swab appointment: n/a  COVID Vaccine Completed: yes x5 Date COVID Vaccine completed: 03/18/19, 04/07/19 Has received booster: 10/20/19, 05/20/20 COVID vaccine manufacturer: [place]      Date of COVID positive in last 90 days: no  PCP - Josetta Huddle, MD Cardiologist - Daneen Schick, MD  Cardiac Clearance 12/17/20 by Daneen Schick in [place]  Chest x-ray - n/a EKG - 12/17/20 Epic Stress Test - 01/24/19 Epic ECHO - n/a Cardiac Cath - 02/13/19 Epic Pacemaker/ICD device last checked: n/a Spinal Cord Stimulator: n/a  Sleep Study - yes, negative CPAP -   Fasting Blood Sugar - n/a Checks Blood Sugar _____ times a day  Blood Thinner Instructions: Plavix, hold 7 days Aspirin Instructions: ASA 81, continue Last Dose: last dose 12/18/20  Activity level: Can go up a flight of stairs and perform activities of daily living without stopping and without symptoms of chest pain or shortness of breath.    Anesthesia review: CAD, HTN, stent, leukemia   Patient denies shortness of breath, fever, cough and chest pain at PAT appointment   Patient verbalized understanding of instructions that were given to them at the PAT appointment. Patient was also instructed that they will need to review over the PAT instructions again at home before surgery.

## 2020-12-22 ENCOUNTER — Encounter (HOSPITAL_COMMUNITY): Payer: Self-pay

## 2020-12-22 ENCOUNTER — Ambulatory Visit (INDEPENDENT_AMBULATORY_CARE_PROVIDER_SITE_OTHER): Payer: Medicare Other | Admitting: Podiatry

## 2020-12-22 ENCOUNTER — Encounter (HOSPITAL_COMMUNITY)
Admission: RE | Admit: 2020-12-22 | Discharge: 2020-12-22 | Disposition: A | Discharge status: Home / Self Care | Payer: Medicare Other | Source: Ambulatory Visit | Attending: Urology | Admitting: Urology

## 2020-12-22 ENCOUNTER — Encounter: Payer: Self-pay | Admitting: Podiatry

## 2020-12-22 DIAGNOSIS — Q828 Other specified congenital malformations of skin: Secondary | ICD-10-CM

## 2020-12-22 DIAGNOSIS — Z7982 Long term (current) use of aspirin: Secondary | ICD-10-CM | POA: Diagnosis not present

## 2020-12-22 DIAGNOSIS — I2510 Atherosclerotic heart disease of native coronary artery without angina pectoris: Secondary | ICD-10-CM | POA: Diagnosis not present

## 2020-12-22 DIAGNOSIS — N35919 Unspecified urethral stricture, male, unspecified site: Secondary | ICD-10-CM | POA: Insufficient documentation

## 2020-12-22 DIAGNOSIS — Z79899 Other long term (current) drug therapy: Secondary | ICD-10-CM | POA: Diagnosis not present

## 2020-12-22 DIAGNOSIS — M79676 Pain in unspecified toe(s): Secondary | ICD-10-CM

## 2020-12-22 DIAGNOSIS — C9110 Chronic lymphocytic leukemia of B-cell type not having achieved remission: Secondary | ICD-10-CM | POA: Insufficient documentation

## 2020-12-22 DIAGNOSIS — G629 Polyneuropathy, unspecified: Secondary | ICD-10-CM

## 2020-12-22 DIAGNOSIS — E538 Deficiency of other specified B group vitamins: Secondary | ICD-10-CM

## 2020-12-22 DIAGNOSIS — I10 Essential (primary) hypertension: Secondary | ICD-10-CM | POA: Diagnosis not present

## 2020-12-22 DIAGNOSIS — L84 Corns and callosities: Secondary | ICD-10-CM | POA: Diagnosis not present

## 2020-12-22 DIAGNOSIS — B351 Tinea unguium: Secondary | ICD-10-CM

## 2020-12-22 DIAGNOSIS — K5090 Crohn's disease, unspecified, without complications: Secondary | ICD-10-CM | POA: Diagnosis not present

## 2020-12-22 DIAGNOSIS — Z955 Presence of coronary angioplasty implant and graft: Secondary | ICD-10-CM | POA: Diagnosis not present

## 2020-12-22 DIAGNOSIS — Z01818 Encounter for other preprocedural examination: Secondary | ICD-10-CM | POA: Insufficient documentation

## 2020-12-22 HISTORY — DX: Essential (primary) hypertension: I10

## 2020-12-23 NOTE — Progress Notes (Signed)
Anesthesia Chart Review:   Case: [id number] Date/Time: 12/30/20 1415   Procedure: CYSTOSCOPY WITH BALLOON DILATATION OF URETHRAL STRICTURE   Anesthesia type: General   Pre-op diagnosis: URETHRAL STRICTURE, HEMATURIA   [facility] 03 / [facility]   Surgeons: Raynelle Bring, MD       DISCUSSION: Pt is 75 years old with hx CAD (s/p stent to LAD 1999, DES to RCA 2020), HTN, CLL, Crohn's disease  Pt to hold plavix 5 days before OR and switch to ASA   VS: BP 131/71   Pulse 69   Temp 36.8 C (Oral)   Resp 16   Ht 5' 7"  (1.702 m)   Wt 81.1 kg   SpO2 99%   BMI 28.00 kg/m   PROVIDERS: - PCP is Josetta Huddle, MD - Cardiologist is Daneen Schick, MD who cleared pt for surgery at last office visit 12/17/20 - Oncologist is Betsy Coder, MD  LABS:  - CBC w/diff and CMP 12/13/20 acceptable for surgery   EKG 12/17/20: sinus bradycardia, biatrial abnormality, probable left ventricular hypertrophy   CV: Cardiac cath 02/13/19:  Widely patent left main 70 to 80% ISR and previously placed proximal LAD bare-metal stent.  The stent crosses over a large diagonal which contains 85% in the ostium due to jailing. Medically managed Circumflex gives origin to 3 marginals.  No significant obstruction is noted. RCA is severely obstructed, with eccentric 90 to 95% stenosis. Underwent PCI with DES to RCA 02/14/19 Mild mid anterior wall hypokinesis.  EF 45 to 50%.  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--  ONCOLOGIST-  DR [facility]   PT IS ASYMPTOMATIC--- LAST CBC W/ DIFF 06-24-2012 STABLE   Coronary artery disease CARDIOLOGIST- DR Daneen Schick   S/P STENTING LAD 1999 // Myoview 01/2019: EF 62, normal perfusion; Low Risk   Crohn's disease of ileum ([place]) [place]   ED (erectile dysfunction)    Elevated PSA    Essential and other  specified forms of tremor 11/25/2012   Gait abnormality 05/17/2020   H/O adenomatous polyp of colon    Hyperlipidemia    Hypertension    Nocturia    OA (osteoarthritis)    Peripheral neuropathy    hx of, none current as of 08-04-13   Peyronie disease    S/P coronary artery stent placement OCT 1999 OF LAD   Tremor, hereditary, benign MILD RIGHT HAND    Past Surgical History:  Procedure Laterality Date   CARDIOVASCULAR STRESS TEST  11-01-2010 DR Daneen Schick   NORMAL PERFUSION STUDY/ EF 64%/ NO ISCHEMIA   cataract surgery  Bilateral feburary 2020   with lens placement    COLONOSCOPY WITH PROPOFOL N/A 09/24/2012   Procedure: COLONOSCOPY WITH PROPOFOL;  Surgeon: [name], MD;  [facility];  Service: Endoscopy;  Laterality: N/A;   CORONARY ANGIOPLASTY WITH STENT PLACEMENT  OCT 1999   STENT OF LAD   CORONARY STENT INTERVENTION N/A 02/14/2019   Procedure: CORONARY STENT INTERVENTION;  Surgeon: Belva Crome, MD;  Location: [facility];  Service: Cardiovascular;  Laterality: N/A;   CYSTOSCOPY WITH URETHRAL DILATATION  03/12/2017   Procedure: CYSTOSCOPY WITH URETHRAL DILATATION;  Surgeon: Gaynelle Arabian, MD;  [facility];  Service: Orthopedics;;  Ammie Dalton, Resident Assisting   CYSTOSCOPY WITH URETHRAL DILATATION N/A 10/03/2018   Procedure:  CYSTOSCOPY WITH URETHRAL BALLOON DILATATION WITH BILATERAL RETROGRADE PYELOGRAPHY;  Surgeon: Raynelle Bring, MD;  [facility];  Service: Urology;  Laterality: N/A;   LAPAROSCOPIC INGUINAL HERNIA REPAIR Bilateral 01-10-2004   W/ MESH   LEFT HEART CATH AND CORONARY ANGIOGRAPHY N/A 02/13/2019   Procedure: LEFT HEART CATH AND CORONARY ANGIOGRAPHY;  Surgeon: Belva Crome, MD;  Location: [place] [facility];  Service: Cardiovascular;  Laterality: N/A;   neck benign removed from neck  yrs ago   PROSTATE BIOPSY N/A 07/12/2012   Procedure: PROSTATE BIOPSY AND ULTRASOUND;  Surgeon: Ailene Rud, MD;  Location: [facility];  Service: Urology;  Laterality: N/A;   PROSTATE SURGERY  2002   tuna   REMOVAL LEFT NECK LYMPH NODE  1996   TONSILLECTOMY  CHILD   TOTAL KNEE ARTHROPLASTY Right 03/12/2017   Procedure: RIGHT TOTAL KNEE ARTHROPLASTY;  Surgeon: Gaynelle Arabian, MD;  [facility];  Service: Orthopedics;  Laterality: Right;  Adductor Block   TRANSURETHRAL RESECTION OF PROSTATE N/A 08/08/2013   Procedure: TRANSURETHRAL RESECTION OF THE PROSTATE WITH GYRUS INSTRUMENTS;  Surgeon: Ailene Rud, MD;  [facility];  Service: Urology;  Laterality: N/A;   TRANSURETHRAL RESECTION OF PROSTATE      MEDICATIONS:  acetaminophen (TYLENOL) 500 MG tablet   amphetamine-dextroamphetamine (ADDERALL) 5 MG tablet   aspirin EC 81 MG tablet   atorvastatin (LIPITOR) 20 MG tablet   calcium citrate (CALCITRATE - DOSED IN MG ELEMENTAL CALCIUM) 950 (200 Ca) MG tablet   Cholecalciferol 25 MCG (1000 UT) tablet   Coenzyme Q10 (CO Q-10) 200 MG CAPS   Cyanocobalamin (VITAMIN B-12) 2500 MCG SUBL   diclofenac Sodium (VOLTAREN) 1 % GEL   famotidine (PEPCID) 20 MG tablet   metoprolol succinate (TOPROL-XL) 25 MG 24 hr tablet   pantoprazole (PROTONIX) 40 MG tablet   primidone (MYSOLINE) 50 MG tablet   No current facility-administered medications for this encounter.   - Pt to hold plavix 5 days before OR and switch to ASA   If no changes, I anticipate pt can proceed with surgery as scheduled.   Willeen Cass, PhD, FNP-BC [facility]/Anesthesiology Phone: [phone] 12/23/2020 11:28 AM

## 2020-12-23 NOTE — Anesthesia Preprocedure Evaluation (Addendum)
Anesthesia Evaluation  Patient identified by MRN, date of birth, ID band Patient awake    Reviewed: Allergy & Precautions, NPO status , Patient's Chart, lab work & pertinent test results, reviewed documented beta blocker date and time   Airway Mallampati: II  TM Distance: >3 FB Neck ROM: Full    Dental  (+) Dental Advisory Given, Teeth Intact   Pulmonary former smoker,  Quit smoking 1986 10 pack years    Pulmonary exam normal breath sounds clear to auscultation       Cardiovascular hypertension, Pt. on medications and Pt. on home beta blockers + CAD and + Cardiac Stents (LAD 1999, DES to RCA 2020)  Normal cardiovascular exam Rhythm:Regular Rate:Normal  Cath 2020: ? 95% RCA reduced to 0% with TIMI grade III flow using a 2.75 x 15 Onyx deployed at 14 atm.   ? Angiographic reevaluation of LAD in-stent restenosis demonstrated 50 to 70%.  In absence of ischemia noted on recent nuclear study in the anteroapical segment the decision was made to continue medical therapy.    Neuro/Psych PSYCHIATRIC DISORDERS Benign tremor     GI/Hepatic GERD  Medicated and Controlled,(+)     substance abuse  alcohol use,   Endo/Other  Hypothyroidism   Renal/GU Urethral stricture, hematoma   negative genitourinary   Musculoskeletal  (+) Arthritis , Osteoarthritis,    Abdominal   Peds  Hematology CLL 1996   Anesthesia Other Findings   Reproductive/Obstetrics negative OB ROS                          Anesthesia Physical Anesthesia Plan  ASA: 3  Anesthesia Plan: General   Post-op Pain Management:    Induction: Intravenous  PONV Risk Score and Plan: 3 and Ondansetron, Dexamethasone and Treatment may vary due to age or medical condition  Airway Management Planned: LMA  Additional Equipment: None  Intra-op Plan:   Post-operative Plan: Extubation in OR  Informed Consent: I have reviewed the patients  History and Physical, chart, labs and discussed the procedure including the risks, benefits and alternatives for the proposed anesthesia with the patient or authorized representative who has indicated his/her understanding and acceptance.     Dental advisory given  Plan Discussed with: CRNA  Anesthesia Plan Comments:       Anesthesia Quick Evaluation

## 2020-12-24 ENCOUNTER — Ambulatory Visit: Payer: Medicare Other | Admitting: Physical Therapy

## 2020-12-24 ENCOUNTER — Other Ambulatory Visit: Payer: Self-pay

## 2020-12-24 ENCOUNTER — Encounter: Payer: Self-pay | Admitting: Physical Therapy

## 2020-12-24 DIAGNOSIS — M6281 Muscle weakness (generalized): Secondary | ICD-10-CM

## 2020-12-24 DIAGNOSIS — R2689 Other abnormalities of gait and mobility: Secondary | ICD-10-CM | POA: Diagnosis not present

## 2020-12-24 DIAGNOSIS — R2681 Unsteadiness on feet: Secondary | ICD-10-CM | POA: Diagnosis not present

## 2020-12-24 DIAGNOSIS — R293 Abnormal posture: Secondary | ICD-10-CM | POA: Diagnosis not present

## 2020-12-24 DIAGNOSIS — Z9181 History of falling: Secondary | ICD-10-CM | POA: Diagnosis not present

## 2020-12-24 NOTE — Therapy (Signed)
[facility] @ [place], [place], [address] Phone: [phone]   Fax:  [phone]  Physical Therapy Treatment  Patient Details  Name: Cole Martinez MRN: [id number] Date of Birth: [birth year] Referring Provider (PT): Margette Fast, MD   Encounter Date: 12/24/2020   PT End of Session - 12/24/20 1015     Visit Number 9    Date for PT Re-Evaluation 01/10/21    Authorization Type Medicare A and B    Authorization Time Period 11/15/20 to 01/10/21    Authorization - Visit Number 8    Progress Note Due on Visit 10    PT Start Time 2119    PT Stop Time 1100    PT Time Calculation (min) 45 min    Activity Tolerance Patient tolerated treatment well;No increased pain    Behavior During Therapy WFL for tasks assessed/performed             Past Medical History:  Diagnosis Date   ADHD (attention deficit hyperactivity disorder)    B12 deficiency    BPH (benign prostatic hypertrophy)    Chronic lymphocytic leukemia (CLL), 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--  ONCOLOGIST-  DR Benay Spice   PT IS ASYMPTOMATIC--- LAST CBC W/ DIFF 06-24-2012 STABLE   Coronary artery disease CARDIOLOGIST- DR Daneen Schick   S/P STENTING LAD 1999 // Myoview 01/2019: EF 62, normal perfusion; Low Risk   Crohn's disease of ileum ([place]) [place]   ED (erectile dysfunction)    Elevated PSA    Essential and other specified forms of tremor 11/25/2012   Gait abnormality 05/17/2020   H/O adenomatous polyp of colon    Hyperlipidemia    Hypertension    Nocturia    OA (osteoarthritis)    Peripheral neuropathy    hx of, none current as of 08-04-13   Peyronie disease    S/P coronary artery stent placement OCT 1999 OF LAD   Tremor, hereditary, benign MILD RIGHT HAND    Past Surgical History:  Procedure Laterality Date   CARDIOVASCULAR STRESS TEST  11-01-2010 DR Daneen Schick   NORMAL PERFUSION STUDY/ EF 64%/ NO ISCHEMIA   cataract surgery  Bilateral feburary  2020   with lens placement    COLONOSCOPY WITH PROPOFOL N/A 09/24/2012   Procedure: COLONOSCOPY WITH PROPOFOL;  Surgeon: [name], MD;  [facility];  Service: Endoscopy;  Laterality: N/A;   CORONARY ANGIOPLASTY WITH STENT PLACEMENT  OCT 1999   STENT OF LAD   CORONARY STENT INTERVENTION N/A 02/14/2019   Procedure: CORONARY STENT INTERVENTION;  Surgeon: Belva Crome, MD;  Location: [facility] [place] [facility];  Service: Cardiovascular;  Laterality: N/A;   CYSTOSCOPY WITH URETHRAL DILATATION  03/12/2017   Procedure: CYSTOSCOPY WITH URETHRAL DILATATION;  Surgeon: Gaynelle Arabian, MD;  [facility];  Service: Orthopedics;;  Ammie Dalton, Resident Assisting   CYSTOSCOPY WITH URETHRAL DILATATION N/A 10/03/2018   Procedure: CYSTOSCOPY WITH URETHRAL BALLOON DILATATION WITH BILATERAL RETROGRADE PYELOGRAPHY;  Surgeon: Raynelle Bring, MD;  [facility];  Service: Urology;  Laterality: N/A;   LAPAROSCOPIC INGUINAL HERNIA REPAIR Bilateral 01-10-2004   W/ MESH   LEFT HEART CATH AND CORONARY ANGIOGRAPHY N/A 02/13/2019   Procedure: LEFT HEART CATH AND CORONARY ANGIOGRAPHY;  Surgeon: Belva Crome, MD;  Location: [place];  Service: Cardiovascular;  Laterality: N/A;   neck benign removed from neck  yrs ago   PROSTATE BIOPSY N/A 07/12/2012   Procedure: PROSTATE BIOPSY AND ULTRASOUND;  Surgeon: Ailene Rud, MD;  Location: [facility];  Service: Urology;  Laterality: N/A;   PROSTATE SURGERY  2002   tuna   REMOVAL LEFT NECK LYMPH NODE  1996   TONSILLECTOMY  CHILD   TOTAL KNEE ARTHROPLASTY Right 03/12/2017   Procedure: RIGHT TOTAL KNEE ARTHROPLASTY;  Surgeon: Gaynelle Arabian, MD;  [facility];  Service: Orthopedics;  Laterality: Right;  Adductor Block   TRANSURETHRAL RESECTION OF PROSTATE N/A 08/08/2013   Procedure: TRANSURETHRAL RESECTION OF THE PROSTATE WITH GYRUS INSTRUMENTS;  Surgeon: Ailene Rud, MD;  [facility];  Service: Urology;  Laterality: N/A;    TRANSURETHRAL RESECTION OF PROSTATE      There were no vitals filed for this visit.   Subjective Assessment - 12/24/20 1016     Subjective Patient states he had an exacerbation of his Crohns and was unable to exercise for a few days. He states he may need to modify his exercises today.    Pertinent History Rt TKA    Limitations Walking;House hold activities    How long can you walk comfortably? he takes walks 10-30 minutes in the mornings during the week, and he walks 66mn on the weekends    Patient Stated Goals improve steadiness of his gait    Pain Score 0-No pain                               [facility] Adult PT Treatment/Exercise - 12/24/20 0001       Ambulation/Gait   Gait Comments Hurdle side stepping, 4 hurdles x 5 laps      Knee/Hip Exercises: Aerobic   Nustep L3 x 10 min, seat 9      Knee/Hip Exercises: Machines for Strengthening   Hip Cybex Hip abd and hip ext 25# x10B      Knee/Hip Exercises: Seated   Other Seated Knee/Hip Exercises isometric hip abd x20 LLE      Knee/Hip Exercises: Sidelying   Clams 2x10 reps   yellow TB   Other Sidelying Knee/Hip Exercises hip abduction 3x5 reps    Other Sidelying Knee/Hip Exercises pt could fire glute medius with ankle support with knee bent and after 3 reps, without assist                     PT Education - 12/24/20 1051     Education Details Suggested holding on clamshells to reduce reinforcing ER of left hip. Suggested emphasizing side stepping with band more.              PT Short Term Goals - 11/24/20 1151       PT SHORT TERM GOAL #1   Title Pt will be independent with his initial HEP to improve strength and balance.    Status Achieved               PT Long Term Goals - 12/21/20 1155       PT LONG TERM GOAL #1   Title Pt will have greater than 4/5 MMT strength of BLE which will improve his gait mechanics and balance reactions in standing.    Status On-going      PT  LONG TERM GOAL #2   Title Pt will have atleast 6 point increase on the BERG to reflect a significant improvement in his balance and decreased risk of falling.    Status On-going      PT  LONG TERM GOAL #3   Title Pt will be independent with a regular/advanced LE strengthening program at his gym in order to maintain progress made during his POC.    Status On-going      PT LONG TERM GOAL #4   Title Pt will score greater than 19 points on the DGI to reflect a decrease in his risk of falling and injuring himself.    Status On-going                   Plan - 12/24/20 1022     Clinical Impression Statement Patient initially felt he would need to modify his exercises today but he was able to complete all tasks with no issues. Left glute medius now firing but patient continues to need verbal and tactile cues for proper technique    Personal Factors and Comorbidities Age;Time since onset of injury/illness/exacerbation    Examination-Activity Limitations Locomotion Level;Stairs    Examination-Participation Restrictions Community Activity    Stability/Clinical Decision Making Stable/Uncomplicated    Rehab Potential Good    PT Frequency 2x / week    PT Duration 8 weeks    PT Treatment/Interventions ADLs/Self Care Home Management;Gait training;Functional mobility training;Therapeutic activities;Therapeutic exercise;Neuromuscular re-education;Balance training;Stair training;Patient/family education;Manual techniques;Passive range of motion;Aquatic Therapy    PT Next Visit Plan Follow up on Lt glute strength what is working best at home for pt: can he get Lt glute medius firing? pt will look to schedule a few pool sessions for Novemeber    PT Home Exercise Plan Access Code: [email or web address]    [id number] and Agree with Plan of Care Patient             Patient will benefit from skilled therapeutic intervention in order to improve the following deficits and impairments:  Abnormal gait, Decreased  balance, Difficulty walking, Decreased endurance, Decreased strength, Decreased safety awareness, Impaired flexibility  Visit Diagnosis: Muscle weakness (generalized)  Unsteadiness on feet  Other abnormalities of gait and mobility     Problem List Patient Active Problem List   Diagnosis Date Noted   Adult attention deficit disorder 06/04/2020   Chronic lymphocytic leukemia ([place]) 06/04/2020   Conductive hearing loss, bilateral 06/04/2020   Crohn's ileitis ([place]) 06/04/2020   Disorder of musculoskeletal system 06/04/2020   Erectile dysfunction 06/04/2020   Gastroesophageal reflux disease 06/04/2020   Gout 06/04/2020   History of adenomatous polyp of colon 06/04/2020   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   Hypothyroidism 06/04/2020   Increased frequency of urination 06/04/2020   Male hypogonadism 06/04/2020   Muscle pain 06/04/2020   Nocturia 06/04/2020   Obesity 06/04/2020   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   Osteopenia 06/04/2020   Peripheral neurogenic pain 06/04/2020   Pure hypercholesterolemia 06/04/2020   Recurrent falls 06/04/2020   Unspecified kyphosis, thoracic region 06/04/2020   Vitamin B12 deficiency 06/04/2020   Vitamin D deficiency 06/04/2020   Gait abnormality 05/17/2020   Ataxia 03/16/2020   Angina pectoris ([place]) 02/13/2019   Pseudophakia of both eyes 08/01/2018   History of total knee arthroplasty 03/29/2017   Stiffness of right knee 03/16/2017   OA (osteoarthritis) of knee 03/12/2017   Tremor, essential 12/03/2015   Essential hypertension 12/09/2014   Coronary artery disease involving native heart 12/08/2013   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[place]) 12/08/2013   Hyperlipidemia 12/08/2013   Benign prostatic hypertrophy 08/08/2013   Essential and other specified forms of tremor 11/25/2012   Anderson Malta B. Amayrani Bennick, PT 12/24/2208:57 AM   [facility] @ [place], [place], [address] Phone: [phone]   Fax:  [phone]  Name: LYN DEEMER MRN: [id number] Date of Birth: 12-31-1945

## 2020-12-27 NOTE — Progress Notes (Signed)
Subjective: Cole Martinez is a 75 y.o. male patient seen today for follow up of  painful thick toenails that are difficult to trim. Pain interferes with ambulation. Aggravating factors include wearing enclosed shoe gear. Pain is relieved with periodic professional debridement.  New problems reported today: None.  No Known Allergies  Objective: Physical Exam  General: Patient is a pleasant 75 y.o. Caucasian male WD, WN in NAD. AAO x 3.   Neurovascular Examination: Neurovascular status unchanged b/l lower extremities. Capillary refill time to digits immediate b/l. Palpable DP pulse(s) b/l lower extremities Palpable PT pulse(s) b/l lower extremities Pedal hair absent. Lower extremity skin temperature gradient within normal limits. No pain with calf compression b/l. No edema noted b/l lower extremities.  Pt has subjective symptoms of neuropathy. Protective sensation intact 5/5 intact bilaterally with 10g monofilament b/l.  Dermatological:  Pedal skin thin, shiny and atrophic b/l LE. No open wounds b/l LE. No interdigital macerations b/l lower extremities. Toenails 1-5 b/l elongated, discolored, dystrophic, thickened, crumbly with subungual debris and tenderness to dorsal palpation. Hyperkeratotic lesion(s) submet head 2 left foot.  No erythema, no edema, no drainage, no fluctuance. Porokeratotic lesion(s) submet head 5 b/l. No erythema, no edema, no drainage, no fluctuance.  Musculoskeletal:  Normal muscle strength 5/5 to all lower extremity muscle groups bilaterally. Hammertoe(s) noted to the 1-5 left.  Assessment: 1. Pain due to onychomycosis of toenail   2. Callus   3. Porokeratosis   4. Neuropathy   5. Vitamin B12 deficiency    Plan: Patient was evaluated and treated and all questions answered. Consent given for treatment as described below: -Patient to continue soft, supportive shoe gear daily. -Mycotic toenails were debrided in length and girth with sterile nail nippers and  dremel without iatrogenic bleeding. -Callus(es) submet head 2 left foot pared utilizing sterile scalpel blade without complication or incident. Total number debrided =1. -Painful porokeratotic lesion(s) submet head 5 b/l pared and enucleated with sterile scalpel blade without incident. Total number of lesions debrided=2. -Patient to report any pedal injuries to medical professional immediately. -Patient/POA to call should there be question/concern in the interim.  Return in about 3 months (around 03/24/2021).  Marzetta Board, DPM

## 2020-12-28 ENCOUNTER — Ambulatory Visit: Payer: Medicare Other

## 2020-12-28 ENCOUNTER — Other Ambulatory Visit: Payer: Self-pay

## 2020-12-28 DIAGNOSIS — R2681 Unsteadiness on feet: Secondary | ICD-10-CM

## 2020-12-28 DIAGNOSIS — R2689 Other abnormalities of gait and mobility: Secondary | ICD-10-CM

## 2020-12-28 DIAGNOSIS — R293 Abnormal posture: Secondary | ICD-10-CM | POA: Diagnosis not present

## 2020-12-28 DIAGNOSIS — M6281 Muscle weakness (generalized): Secondary | ICD-10-CM

## 2020-12-28 DIAGNOSIS — Z9181 History of falling: Secondary | ICD-10-CM | POA: Diagnosis not present

## 2020-12-28 NOTE — Therapy (Signed)
[facility] @ [facility] [place], [place], [address] Phone: [phone]   Fax:  [phone]  Physical Therapy Treatment  Patient Details  Name: Cole Martinez MRN: [id number] Date of Birth: [birth year] Referring Provider (PT): Margette Fast, MD   Encounter Date: 12/28/2020 Progress Note Reporting Period 11/15/20 to 12/28/20  See note below for Objective Data and Assessment of Progress/Goals.      PT End of Session - 12/28/20 1148     Visit Number 10    Date for PT Re-Evaluation 01/10/21    Authorization Type Medicare A and B    Progress Note Due on Visit 10    PT Start Time 1105    PT Stop Time 1145    PT Time Calculation (min) 40 min    Equipment Utilized During Treatment Gait belt    Activity Tolerance Patient tolerated treatment well;No increased pain    Behavior During Therapy WFL for tasks assessed/performed             Past Medical History:  Diagnosis Date   ADHD (attention deficit hyperactivity disorder)    B12 deficiency    BPH (benign prostatic hypertrophy)    Chronic lymphocytic leukemia (CLL), 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--  ONCOLOGIST-  DR Benay Spice   PT IS ASYMPTOMATIC--- LAST CBC W/ DIFF 06-24-2012 STABLE   Coronary artery disease CARDIOLOGIST- DR Daneen Schick   S/P STENTING LAD 1999 // Myoview 01/2019: EF 62, normal perfusion; Low Risk   Crohn's disease of ileum ([place]) [place]   ED (erectile dysfunction)    Elevated PSA    Essential and other specified forms of tremor 11/25/2012   Gait abnormality 05/17/2020   H/O adenomatous polyp of colon    Hyperlipidemia    Hypertension    Nocturia    OA (osteoarthritis)    Peripheral neuropathy    hx of, none current as of 08-04-13   Peyronie disease    S/P coronary artery stent placement OCT 1999 OF LAD   Tremor, hereditary, benign MILD RIGHT HAND    Past Surgical History:  Procedure Laterality Date   CARDIOVASCULAR STRESS TEST  11-01-2010 DR Daneen Schick   NORMAL PERFUSION STUDY/ EF 64%/ NO ISCHEMIA   cataract surgery  Bilateral feburary 2020   with lens placement    COLONOSCOPY WITH PROPOFOL N/A 09/24/2012   Procedure: COLONOSCOPY WITH PROPOFOL;  Surgeon: [name], MD;  Location: [facility];  Service: Endoscopy;  Laterality: N/A;   CORONARY ANGIOPLASTY WITH STENT PLACEMENT  OCT 1999   STENT OF LAD   CORONARY STENT INTERVENTION N/A 02/14/2019   Procedure: CORONARY STENT INTERVENTION;  Surgeon: Belva Crome, MD;  Location: [facility];  Service: Cardiovascular;  Laterality: N/A;   CYSTOSCOPY WITH URETHRAL DILATATION  03/12/2017   Procedure: CYSTOSCOPY WITH URETHRAL DILATATION;  Surgeon: Gaynelle Arabian, MD;  Location: [facility];  Service: Orthopedics;;  Ammie Dalton, Resident Assisting   CYSTOSCOPY WITH URETHRAL DILATATION N/A 10/03/2018   Procedure: CYSTOSCOPY WITH URETHRAL BALLOON DILATATION WITH BILATERAL RETROGRADE PYELOGRAPHY;  Surgeon: Raynelle Bring, MD;  Location: [facility];  Service: Urology;  Laterality: N/A;   LAPAROSCOPIC INGUINAL HERNIA REPAIR Bilateral 01-10-2004   W/ MESH   LEFT HEART CATH AND CORONARY ANGIOGRAPHY N/A 02/13/2019   Procedure: LEFT HEART CATH AND CORONARY ANGIOGRAPHY;  Surgeon: Belva Crome, MD;  Location: [facility];  Service: Cardiovascular;  Laterality: N/A;   neck benign removed from neck  yrs ago  PROSTATE BIOPSY N/A 07/12/2012   Procedure: PROSTATE BIOPSY AND ULTRASOUND;  Surgeon: Ailene Rud, MD;  [facility];  Service: Urology;  Laterality: N/A;   PROSTATE SURGERY  2002   tuna   REMOVAL LEFT NECK LYMPH NODE  1996   TONSILLECTOMY  CHILD   TOTAL KNEE ARTHROPLASTY Right 03/12/2017   Procedure: RIGHT TOTAL KNEE ARTHROPLASTY;  Surgeon: Gaynelle Arabian, MD;  Location: [facility];  Service: Orthopedics;  Laterality: Right;  Adductor Block   TRANSURETHRAL RESECTION OF PROSTATE N/A 08/08/2013   Procedure: TRANSURETHRAL RESECTION OF THE PROSTATE WITH GYRUS  INSTRUMENTS;  Surgeon: Ailene Rud, MD;  Location: [facility];  Service: Urology;  Laterality: N/A;   TRANSURETHRAL RESECTION OF PROSTATE      There were no vitals filed for this visit.   Subjective Assessment - 12/28/20 1106     Subjective I did the bike already today.  My balance feels better- I feel more secure walking the dog.    How long can you walk comfortably? he takes walks 10-30 minutes in the mornings during the week, and he walks 39mn on the weekends    Patient Stated Goals improve steadiness of his gait    Currently in Pain? No/denies                [facility]28[facility]20 0001       Assessment   Medical Diagnosis gait instability    Referring Provider (PT) cMargette Fast MD      Cognition   Overall Cognitive Status Within Functional Limits for tasks assessed      Berg Balance Test   Sit to Stand Able to stand without using hands and stabilize independently    Standing Unsupported Able to stand safely 2 minutes    Sitting with Back Unsupported but Feet Supported on Floor or Stool Able to sit safely and securely 2 minutes    Stand to Sit Sits safely with minimal use of hands    Transfers Able to transfer safely, minor use of hands    Standing Unsupported with Eyes Closed Able to stand 10 seconds with supervision    Standing Unsupported with Feet Together Able to place feet together independently and stand 1 minute safely    From Standing, Reach Forward with Outstretched Arm Can reach confidently >25 cm (10")    From Standing Position, Pick up Object from Floor Able to pick up shoe safely and easily    From Standing Position, Turn to Look Behind Over each Shoulder Looks behind one side only/other side shows less weight shift    Turn 360 Degrees Able to turn 360 degrees safely but slowly    Standing Unsupported, Alternately Place Feet on Step/Stool Able to stand independently and complete 8 steps >20 seconds    Standing Unsupported, One Foot in Front  Able to plae foot ahead of the other independently and hold 30 seconds    Standing on One Leg Able to lift leg independently and hold 5-10 seconds    Total Score 49    Berg comment: reduced falls risk                           [facility] Adult PT Treatment/Exercise - 12/28/20 0001       Knee/Hip Exercises: Standing   Hip Abduction Stengthening;Both;2 sets;10 reps   standing leg on green pod   Other Standing Knee Exercises standing on foam pad: alternating  step-taps on edge of step with bil UE support x20                 Balance Exercises - 12/28/20 0001       Balance Exercises: Standing   Other Standing Exercises box stepping with discs on floor x 5 laps each direction-gait belt used for safety.  Sidestepping to discs with alternaing steps taps on low cones- tactile and verbal cues for glute med activation.                  PT Short Term Goals - 11/24/20 1151       PT SHORT TERM GOAL #1   Title Pt will be independent with his initial HEP to improve strength and balance.    Status Achieved               PT Long Term Goals - 12/28/20 1108       PT LONG TERM GOAL #2   Title Pt will have atleast 6 point increase on the BERG to reflect a significant improvement in his balance and decreased risk of falling.    Baseline met    Status On-going      PT LONG TERM GOAL #3   Title Pt will be independent with a regular/advanced LE strengthening program at his gym in order to maintain progress made during his POC.    Baseline Independent in balance and gym exercises as they are currently-further advancement is needed    Time 8    Period Weeks    Status On-going      PT LONG TERM GOAL #4   Title Pt will score greater than 19 points on the DGI to reflect a decrease in his risk of falling and injuring himself.    Baseline --    Time 8    Period Weeks    Status On-going      PT LONG TERM GOAL #5   Title --                   Plan -  12/28/20 1147     Clinical Impression Statement Pt reports 70% overall improved confidence in his balance since the start of care.  Berg balance score is improved to 49/56 indicating a reduced falls risk although still at risk for falls. Pt with improved postural awareness and is making corrections to reduce forward center of gravity.  Pt required mod to min assistance with standing balance tasks due to instability.  Pt will continue to benefit from skilled PT to address balance, strength and posture to improve safety at home and in the community.    PT Frequency 2x / week    PT Duration 8 weeks    PT Treatment/Interventions ADLs/Self Care Home Management;Gait training;Functional mobility training;Therapeutic activities;Therapeutic exercise;Neuromuscular re-education;Balance training;Stair training;Patient/family education;Manual techniques;Passive range of motion;Aquatic Therapy    PT Next Visit Plan gait, balance and endurance    PT Home Exercise Plan Access Code: [email or web address]    Consulted and Agree with Plan of Care Patient             Patient will benefit from skilled therapeutic intervention in order to improve the following deficits and impairments:  Abnormal gait, Decreased balance, Difficulty walking, Decreased endurance, Decreased strength, Decreased safety awareness, Impaired flexibility  Visit Diagnosis: Muscle weakness (generalized)  Unsteadiness on feet  Other abnormalities of gait and mobility  Abnormal posture     Problem List Patient Active Problem List  Diagnosis Date Noted   Adult attention deficit disorder 06/04/2020   Chronic lymphocytic leukemia ([place]) 06/04/2020   Conductive hearing loss, bilateral 06/04/2020   Crohn's ileitis ([place]) 06/04/2020   Disorder of musculoskeletal system 06/04/2020   Erectile dysfunction 06/04/2020   Gastroesophageal reflux disease 06/04/2020   Gout 06/04/2020   History of adenomatous polyp of colon 06/04/2020   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   Hypothyroidism 06/04/2020   Increased frequency of urination 06/04/2020   Male hypogonadism 06/04/2020   Muscle pain 06/04/2020   Nocturia 06/04/2020   Obesity 06/04/2020   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   Osteopenia 06/04/2020   Peripheral neurogenic pain 06/04/2020   Pure hypercholesterolemia 06/04/2020   Recurrent falls 06/04/2020   Unspecified kyphosis, thoracic region 06/04/2020   Vitamin B12 deficiency 06/04/2020   Vitamin D deficiency 06/04/2020   Gait abnormality 05/17/2020   Ataxia 03/16/2020   Angina pectoris ([place]) 02/13/2019   Pseudophakia of both eyes 08/01/2018   History of total knee arthroplasty 03/29/2017   Stiffness of right knee 03/16/2017   OA (osteoarthritis) of knee 03/12/2017   Tremor, essential 12/03/2015   Essential hypertension 12/09/2014   Coronary artery disease involving native heart 12/08/2013   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

## 2020-12-29 ENCOUNTER — Other Ambulatory Visit: Payer: Self-pay | Admitting: Interventional Cardiology

## 2020-12-29 NOTE — H&P (Signed)
Office Visit Report     12/03/2020   --------------------------------------------------------------------------------   Cole Martinez  MRN: [id number]  DOB: 10-Jun-1945, 75 year old Male  SSN: [id number]   PRIMARY CARE:  [name] (retired), MD  REFERRING:  Ammie Dalton, NP  PROVIDER:  Franchot Gallo, M.D.  TREATING:  Raynelle Bring, M.D.  LOCATION:  [facility] [phone]     --------------------------------------------------------------------------------   CC/HPI: 1. BPH/LUTS  2. Urethral stricture  3. Erectile dysfunction/decreased libido  4. History of abnormal prostate exam  5. Hematuria  6. History of urolithiasis   Cole Martinez returns today for further urologic evaluation of the above issues. Over the past 6 months, he has noted some increased bothersome urinary symptoms including daytime urinary frequency and increased nocturia. He is currently getting up 3-4 times per night. This has become more bothersome. He denies a change in his urinary stream overall. He has had 2 episodes of painless gross hematuria with the last 1 having been 4 months ago. He is status post TURP in 2013 by Dr. Gaynelle Arabian.   He continues to have both a decreased libido as well as erectile dysfunction. His erectile dysfunction has been fairly severe and has been non responsive to multiple oral PDE 5 inhibitors. Furthermore, he has had decreased libido and has previously been on testosterone replacement therapy although this was stopped after he had a cardiac stent placed a few years back. After reviewing the potential risks and benefits of treatment, he previously had declined testosterone replacement therapy and his cardiologist have recommended that he not proceed with testosterone replacement therapy. However, he feels that his quality of life is significantly lacking at this time and is interested in having this discussion again regarding the risks and benefits.     ALLERGIES:  No Allergies    MEDICATIONS: Lipitor  Adderall 5 MG Oral Tablet Oral  Amlodipine Besylate 2.5 mg tablet  Aspirin 81 MG TABS Oral  Celebrex 200 mg capsule  Co Q10  PriLOSEC OTC TBEC Oral  Sildenafil Citrate 20 mg tablet Take 2-5 Tablets PO prn 30-60 minutes before planned activity  Vitamin B 12  Vitamin D3  Vitamin E     GU PSH: Cysto Dilate Stricture (M or F) - 2020 Cystoscopy - 2020 Cystoscopy TURP - 2015 Locm 300-399Mg/Ml Iodine,1Ml - 2020 Prostate Needle Biopsy - 2014 TUMT - 2008       Taylor Notes: Transurethral Resection Of Prostate (TURP), Complete Colonoscopy, Biopsy Of The Prostate Needle, Cath Stent Placement, Surg Prostate Transureth Dest Tissue Microwave Thermotherapy, Inguinal Hernia Repair   NON-GU PSH: Diagnostic Colonoscopy - 2014     GU PMH: Gross hematuria - 2019, - 2019, Gross hematuria, - 2016 Ureteral calculus (Improving) - 2019, - 2019, - 2019 Bulbar urethral stricture - 2019 BPH w/LUTS (Worsening), He does have moderate voiding symptoms with his biggest issue being nocturia 4. He is a light sleeper. He does drink a 20 ounce gin and tonic at 6:00 every night. This may contribute to his large urine volumes at night. - 2018, Benign prostatic hyperplasia with urinary obstruction, - 2016 ED due to arterial insufficiency, Most medical therapy has failed so far. He would like to consider other therapy - 2018, - 2017, Erectile dysfunction due to arterial insufficiency, - 2016 Elevated PSA, Prior history of biopsy in 2014. Biopsies all negative. Nodular prostate today consistent with prior therapeutic maneuvers to prostate - 2018, Elevated prostate specific antigen (PSA), - 2014 Nocturia (Worsening), This is  bothersome to the patient. Perhaps due to nocturnal polyuria. 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 Neisler was previously followed by Dr. Gaynelle Arabian. I assumed his care in January 2019 when he presented as a hospital consultation with a urethral stricture.   1) BPH/LUTS: He is s/p a TUNA in 2002 by Dr. Gaynelle Arabian and eventually a TURP in June 2013.   Jun 2013: TURP (pathology benign)   2) Urethral stricture: He was incidentally noted to have a urethral stricture during cystoscopy at the time of catheter placement prior to a total joint replacement.   Jul 2020: Balloon dilation (to allow cystoscopy for diagnostic purposes)   3) Urolithiasis: He has a history of calcium oxalate urolithiasis.   Jul 2019: Spontaneously passed a stone (calcium oxalate monohydrate)   4) Hematuria: He developed gross hematuria in the spring of 2020.   Jun 2020: Cystoscopy - unable to be performed due to urethral stricture  Jul 2020: CT imaging - unremarkable  Jul 2020: Balloon dilation and cystoscopy (friable prostate regrowth)      NON-GU PMH: Decreased libido - 2020 Crohns Disease (Stable), Crohn's Disease - 2014 Chronic lymphocytic leukemia of B-cell type in remission Parkinson's disease    FAMILY HISTORY: Bile duct cancer - Father Death of family member - Runs In Family Deceased - Mother, Father Family Health Status Number - Runs In Family Father Deceased At [employer] ___ - Runs In Family Mother Deceased At Age 41 from diabetic complicati - Runs In Family No Significant Family History - Runs In Family   SOCIAL HISTORY: Marital Status: Married Preferred Language: English; Ethnicity: Not Hispanic Or Latino; Race: White Current Smoking Status: Patient does not smoke anymore. Smoked 1/2 pack per day.  Drinks 7 drinks per week.  Patient's occupation Company secretary.     Notes: Former smoker, Alcohol Use, Caffeine  Use, Tobacco Use, Marital History - Currently Married, Occupation:   REVIEW OF SYSTEMS:    GU Review Male:   Patient denies frequent urination, hard to postpone urination, burning/ pain with urination, get up at night to urinate, leakage of urine, stream starts and stops, trouble starting your streams, and have to strain to urinate .  Gastrointestinal (Lower):   Patient denies diarrhea and constipation.  Gastrointestinal (Upper):   Patient denies nausea and vomiting.  Constitutional:   Patient denies fever, night sweats, weight loss, and fatigue.  Skin:   Patient denies skin rash/ lesion and itching.  Eyes:   Patient denies blurred vision and double vision.  Ears/ Nose/ Throat:   Patient denies sore throat and sinus problems.  Hematologic/Lymphatic:   Patient denies easy bruising and swollen glands.  Cardiovascular:   Patient denies leg swelling and chest pains.  Respiratory:   Patient denies cough and shortness of breath.  Endocrine:   Patient denies excessive thirst.  Musculoskeletal:   Patient denies back pain and joint pain.  Neurological:   Patient denies headaches and dizziness.  Psychologic:   Patient denies depression and anxiety.   Notes: He denies recent chest pain or shortness of breath.    VITAL SIGNS:      12/03/2020 08:26 AM  Weight 180 lb /  81.65 kg  Height 68 in / 172.72 cm  BP 127/71 mmHg  Pulse 69 /min  Temperature 97.3 F / 36.2 C  BMI 27.4 kg/m   GU PHYSICAL EXAMINATION:    Urethral Meatus: Normal size. No lesion, no wart, no discharge, no polyp. Normal location.  Prostate: Prostate about 50 grams. His exam continues to be abnormal but stable. He continues to have lobularity on his exam but no discrete nodularity or induration to raise increased concern for malignancy.   MULTI-SYSTEM PHYSICAL EXAMINATION:    Constitutional: Well-nourished. No physical deformities. Normally developed. Good grooming.  Respiratory: No labored breathing, no use of accessory  muscles. Clear bilaterally.  Cardiovascular: Normal temperature, normal extremity pulses, no swelling, no varicosities. Regular rate and rhythm.     Complexity of Data:  Records Review:   Previous Patient Records  Urodynamics Review:   Review Bladder Scan   10/22/19 08/14/18 08/22/17 11/29/16 08/09/15 09/04/14 06/19/13 05/16/12  PSA  Total PSA 1.79 ng/mL 2.48 ng/mL 1.73 ng/mL 1.81 ng/mL 1.63  3.36  3.71  3.06   Free PSA      0.97     % Free PSA      29       08/11/15 02/10/15 09/03/14 07/03/13 06/30/13 06/24/13  Hormones  Testosterone, Total 435.7 pg/dL 269  722  241  513  247     PROCEDURES:         Flexible Cystoscopy - 52000  Indication: Lower urinary tract symptoms and hematuria Risks, benefits, and potential complications of the procedure were discussed with the patient including infection, bleeding, voiding discomfort, urinary retention, fever, chills, sepsis, and others. All questions were answered. Informed consent was obtained. Sterile technique and intraurethral analgesia were used.  Meatus:  Normal size. Normal location. Normal condition.  Urethra:  Moderate bulbous stricture. I was unable to pass the flexible cystoscope beyond his stricture      Chaperone: SM The procedure was well-tolerated and without complications. Instructions were given to call the office immediately if questions or problems.        PVR Ultrasound - 16109  Scanned Volume: 85 cc         Urinalysis w/Scope - 81001 Dipstick Dipstick Cont'd Micro  Color: Yellow Bilirubin: Neg WBC/hpf: 0 - 5/hpf  Appearance: Clear Ketones: Neg RBC/hpf: 0 - 2/hpf  Specific Gravity: 1.025 Blood: Neg Bacteria: NS (Not Seen)  pH: 5.5 Protein: Neg Cystals: NS (Not Seen)  Glucose: Neg Urobilinogen: 0.2 Casts: NS (Not Seen)    Nitrites: Neg Trichomonas: Not Present    Leukocyte Esterase: Trace Mucous: Present      Epithelial Cells: 0 - 5/hpf      Yeast: NS (Not Seen)      Sperm: Not Present    Notes:  microscopic  not concentrated     ASSESSMENT:      ICD-10 Details  1 GU:   BPH w/LUTS - N40.1   2   Urinary Frequency - R35.0   3   ED due to arterial insufficiency - N52.01   4   Bulbar urethral stricture - N35.011   6   Gross hematuria - R31.0   5 NON-GU:   Decreased libido - R68.82    PLAN:           Schedule Return Visit/Planned Activity: Other See Visit Notes             Note: Will call to schedule surgery.  Document Letter(s):  Created for Patient: Clinical Summary         Notes:   1. BPH/LUTS/urethral stricture: We discussed his findings on cystoscopy which suggest recurrence of his bulbar urethral stricture. This certainly may be the reason for his increased urinary frequency and we discussed proceeding with cystoscopy and balloon dilation. He was agreeable. He understands that this will not necessarily definitively treat his urinary symptoms but may be a major component of his urinary symptoms. If he does not respond to this treatment, we can consider beta 3 agonist or anticholinergic medication.   2. Hematuria: I was unable to fully evaluate his bladder today due to his urethral stricture. Following balloon dilation, cystoscopy will be performed. I will have him stop Plavix 5 days before and have him proceed with aspirin 81 mg perioperatively in case he does have a bladder tumor, etc. that would require biopsy or resection.   3. Abnormal digital rectal exam: At this time, ongoing PSA screening is not likely to be helpful. His exam is stable.   4. Erectile dysfunction/decreased libido: We discussed treatments for erectile dysfunction refractory to oral medication. He does not really have any interest in proceeding with more aggressive therapy. As such, we also discussed that it does not make much sense to consider treatment of his decreased libido if he is not interested in pursuing more aggressive treatment for his erectile dysfunction. He was in agreement to leave this alone at  this time.   5. History of urolithiasis: No indication of recurrence. However, he will be evaluated to make sure his hematuria is not related to recurrent stone disease particularly if this persists in the absence of findings on cystoscopy.       * Signed by Raynelle Bring, M.D. on 12/03/20 at 2:45 PM (EDT)*

## 2020-12-30 ENCOUNTER — Ambulatory Visit (HOSPITAL_COMMUNITY)
Admission: RE | Admit: 2020-12-30 | Discharge: 2020-12-30 | Disposition: A | Discharge status: Home / Self Care | Payer: Medicare Other | Attending: Urology | Admitting: Urology

## 2020-12-30 ENCOUNTER — Ambulatory Visit (HOSPITAL_COMMUNITY): Payer: Medicare Other | Admitting: Emergency Medicine

## 2020-12-30 ENCOUNTER — Other Ambulatory Visit (HOSPITAL_COMMUNITY): Payer: Self-pay

## 2020-12-30 ENCOUNTER — Ambulatory Visit (HOSPITAL_COMMUNITY): Payer: Medicare Other | Admitting: Anesthesiology

## 2020-12-30 ENCOUNTER — Encounter (HOSPITAL_COMMUNITY)
Admission: RE | Disposition: A | Discharge status: Home / Self Care | Payer: Self-pay | Source: Home / Self Care | Attending: Urology

## 2020-12-30 ENCOUNTER — Ambulatory Visit (HOSPITAL_COMMUNITY): Payer: Medicare Other

## 2020-12-30 ENCOUNTER — Encounter (HOSPITAL_COMMUNITY): Payer: Self-pay | Admitting: Urology

## 2020-12-30 DIAGNOSIS — R350 Frequency of micturition: Secondary | ICD-10-CM | POA: Insufficient documentation

## 2020-12-30 DIAGNOSIS — E7800 Pure hypercholesterolemia, unspecified: Secondary | ICD-10-CM | POA: Diagnosis not present

## 2020-12-30 DIAGNOSIS — Z87891 Personal history of nicotine dependence: Secondary | ICD-10-CM | POA: Insufficient documentation

## 2020-12-30 DIAGNOSIS — R310 Gross hematuria: Secondary | ICD-10-CM | POA: Insufficient documentation

## 2020-12-30 DIAGNOSIS — N401 Enlarged prostate with lower urinary tract symptoms: Secondary | ICD-10-CM | POA: Insufficient documentation

## 2020-12-30 DIAGNOSIS — R351 Nocturia: Secondary | ICD-10-CM | POA: Diagnosis not present

## 2020-12-30 DIAGNOSIS — N35812 Other urethral bulbous stricture, male: Secondary | ICD-10-CM | POA: Diagnosis not present

## 2020-12-30 DIAGNOSIS — E559 Vitamin D deficiency, unspecified: Secondary | ICD-10-CM | POA: Diagnosis not present

## 2020-12-30 DIAGNOSIS — N35912 Unspecified bulbous urethral stricture, male: Secondary | ICD-10-CM | POA: Insufficient documentation

## 2020-12-30 DIAGNOSIS — N5201 Erectile dysfunction due to arterial insufficiency: Secondary | ICD-10-CM | POA: Diagnosis not present

## 2020-12-30 DIAGNOSIS — R6882 Decreased libido: Secondary | ICD-10-CM | POA: Diagnosis not present

## 2020-12-30 DIAGNOSIS — E039 Hypothyroidism, unspecified: Secondary | ICD-10-CM | POA: Diagnosis not present

## 2020-12-30 DIAGNOSIS — Z955 Presence of coronary angioplasty implant and graft: Secondary | ICD-10-CM | POA: Insufficient documentation

## 2020-12-30 DIAGNOSIS — Z87442 Personal history of urinary calculi: Secondary | ICD-10-CM | POA: Diagnosis not present

## 2020-12-30 DIAGNOSIS — N368 Other specified disorders of urethra: Secondary | ICD-10-CM | POA: Diagnosis not present

## 2020-12-30 HISTORY — PX: CYSTOSCOPY WITH URETHRAL DILATATION: SHX5125

## 2020-12-30 SURGERY — CYSTOSCOPY, WITH URETHRAL DILATION
Anesthesia: General

## 2020-12-30 MED ORDER — SODIUM CHLORIDE 0.9 % IR SOLN
Status: DC | PRN
  Administered 2020-12-30: 3000 mL

## 2020-12-30 MED ORDER — ONDANSETRON HCL 4 MG/2ML IJ SOLN
INTRAMUSCULAR | Status: DC | PRN
Start: 2020-12-30 — End: 2020-12-30
  Administered 2020-12-30: 4 mg via INTRAVENOUS

## 2020-12-30 MED ORDER — ORAL CARE MOUTH RINSE: 15.0000 mL | Freq: Once | OROMUCOSAL | Status: AC

## 2020-12-30 MED ORDER — EPHEDRINE SULFATE-NACL 50-0.9 MG/10ML-% IV SOSY
PREFILLED_SYRINGE | INTRAVENOUS | Status: DC | PRN
  Administered 2020-12-30: 10 mg via INTRAVENOUS

## 2020-12-30 MED ORDER — FENTANYL CITRATE (PF) 100 MCG/2ML IJ SOLN
INTRAMUSCULAR | Status: AC
  Filled 2020-12-30: qty 2

## 2020-12-30 MED ORDER — 0.9 % SODIUM CHLORIDE (POUR BTL) OPTIME
TOPICAL | Status: DC | PRN
  Administered 2020-12-30: 1000 mL

## 2020-12-30 MED ORDER — LIDOCAINE HCL (PF) 2 % IJ SOLN
INTRAMUSCULAR | Status: AC
  Filled 2020-12-30: qty 5

## 2020-12-30 MED ORDER — EPHEDRINE 5 MG/ML INJ
INTRAVENOUS | Status: AC
  Filled 2020-12-30: qty 5

## 2020-12-30 MED ORDER — IOHEXOL 300 MG/ML  SOLN
INTRAMUSCULAR | Status: DC | PRN
  Administered 2020-12-30: 10 mL

## 2020-12-30 MED ORDER — ONDANSETRON HCL 4 MG/2ML IJ SOLN: 4.0000 mg | Freq: Once | INTRAMUSCULAR | Status: DC | PRN

## 2020-12-30 MED ORDER — ONDANSETRON HCL 4 MG/2ML IJ SOLN
INTRAMUSCULAR | Status: AC
  Filled 2020-12-30: qty 2

## 2020-12-30 MED ORDER — PHENAZOPYRIDINE HCL 200 MG PO TABS
200.0000 mg | ORAL_TABLET | Freq: Three times a day (TID) | ORAL | 0 refills | Status: DC | PRN
  Filled 2020-12-30: qty 10, 4d supply, fill #0

## 2020-12-30 MED ORDER — LACTATED RINGERS IV SOLN: INTRAVENOUS | Status: DC

## 2020-12-30 MED ORDER — LIDOCAINE 2% (20 MG/ML) 5 ML SYRINGE
INTRAMUSCULAR | Status: DC | PRN
  Administered 2020-12-30: 60 mg via INTRAVENOUS

## 2020-12-30 MED ORDER — PROPOFOL 10 MG/ML IV BOLUS
INTRAVENOUS | Status: AC
  Filled 2020-12-30: qty 20

## 2020-12-30 MED ORDER — FENTANYL CITRATE PF 50 MCG/ML IJ SOSY: 25.0000 ug | PREFILLED_SYRINGE | INTRAMUSCULAR | Status: DC | PRN

## 2020-12-30 MED ORDER — FENTANYL CITRATE (PF) 100 MCG/2ML IJ SOLN
INTRAMUSCULAR | Status: DC | PRN
  Administered 2020-12-30 (×2): 50 ug via INTRAVENOUS

## 2020-12-30 MED ORDER — PROPOFOL 10 MG/ML IV BOLUS
INTRAVENOUS | Status: DC | PRN
  Administered 2020-12-30: 100 mg via INTRAVENOUS

## 2020-12-30 MED ORDER — CHLORHEXIDINE GLUCONATE 0.12 % MT SOLN
15.0000 mL | Freq: Once | OROMUCOSAL | Status: AC
Start: 2020-12-30 — End: 2020-12-30
  Administered 2020-12-30: 15 mL via OROMUCOSAL

## 2020-12-30 MED ORDER — CEFAZOLIN SODIUM-DEXTROSE 2-4 GM/100ML-% IV SOLN
2.0000 g | Freq: Once | INTRAVENOUS | Status: AC
  Administered 2020-12-30: 2 g via INTRAVENOUS
  Filled 2020-12-30: qty 100

## 2020-12-30 SURGICAL SUPPLY — 18 items
BAG URINE DRAIN 2000ML AR STRL (UROLOGICAL SUPPLIES) IMPLANT
BALLN NEPHROSTOMY (BALLOONS) ×2
BALLOON NEPHROSTOMY (BALLOONS) ×1 IMPLANT
CATH FOLEY 2W COUNCIL 20FR 5CC (CATHETERS) IMPLANT
CATH INTERMIT  6FR 70CM (CATHETERS) IMPLANT
CATH ROBINSON RED A/P 14FR (CATHETERS) IMPLANT
CATH URET 5FR 28IN CONE TIP (BALLOONS)
CATH URET 5FR 70CM CONE TIP (BALLOONS) IMPLANT
CLOTH BEACON ORANGE TIMEOUT ST (SAFETY) ×2 IMPLANT
GLOVE SURG ENC MOIS LTX SZ7.5 (GLOVE) ×2 IMPLANT
GOWN STRL REUS W/TWL LRG LVL3 (GOWN DISPOSABLE) ×2 IMPLANT
GUIDEWIRE ANG ZIPWIRE 038X150 (WIRE) IMPLANT
GUIDEWIRE STR DUAL SENSOR (WIRE) ×2 IMPLANT
KIT TURNOVER KIT A (KITS) IMPLANT
MANIFOLD NEPTUNE II (INSTRUMENTS) IMPLANT
NS IRRIG 1000ML POUR BTL (IV SOLUTION) IMPLANT
PACK CYSTO (CUSTOM PROCEDURE TRAY) ×2 IMPLANT
WATER STERILE IRR 3000ML UROMA (IV SOLUTION) ×2 IMPLANT

## 2020-12-30 NOTE — Transfer of Care (Signed)
Immediate Anesthesia Transfer of Care Note  Patient: Cole Martinez  Procedure(s) Performed: CYSTOSCOPY WITH BALLOON DILATATION OF URETHRAL STRICTURE  Patient Location: PACU  Anesthesia Type:General  Level of Consciousness: awake, alert  and patient cooperative  Airway & Oxygen Therapy: Patient Spontanous Breathing and Patient connected to face mask oxygen  Post-op Assessment: Report given to RN and Post -op Vital signs reviewed and stable  Post vital signs: Reviewed and stable  Last Vitals:  Vitals Value Taken Time  BP 132/73 12/30/20 1409  Temp    Pulse 70 12/30/20 1410  Resp 13 12/30/20 1410  SpO2 100 % 12/30/20 1410  Vitals shown include unvalidated device data.  Last Pain:  Vitals:   12/30/20 1259  TempSrc:   PainSc: 0-No pain         Complications: No notable events documented.

## 2020-12-30 NOTE — Interval H&P Note (Signed)
History and Physical Interval Note:  12/30/2020 12:27 PM  Cole Martinez  has presented today for surgery, with the diagnosis of URETHRAL STRICTURE, HEMATURIA.  The various methods of treatment have been discussed with the patient and family. After consideration of risks, benefits and other options for treatment, the patient has consented to  Procedure(s): CYSTOSCOPY WITH BALLOON DILATATION OF URETHRAL STRICTURE (N/A) as a surgical intervention.  The patient's history has been reviewed, patient examined, no change in status, stable for surgery.  I have reviewed the patient's chart and labs.  Questions were answered to the patient's satisfaction.     [employer]

## 2020-12-30 NOTE — Discharge Instructions (Signed)
You may see some blood in the urine and may have some burning with urination for 48-72 hours. You also may notice that you have to urinate more frequently or urgently after your procedure which is normal.  You should call should you develop an inability urinate, fever > 101, persistent nausea and vomiting that prevents you from eating or drinking to stay hydrated.

## 2020-12-30 NOTE — Anesthesia Postprocedure Evaluation (Signed)
Anesthesia Post Note  Patient: Cole Martinez  Procedure(s) Performed: CYSTOSCOPY WITH BALLOON DILATATION OF URETHRAL STRICTURE     Patient location during evaluation: PACU Anesthesia Type: General Level of consciousness: awake and alert, oriented and patient cooperative Pain management: pain level controlled Vital Signs Assessment: post-procedure vital signs reviewed and stable Respiratory status: spontaneous breathing, nonlabored ventilation and respiratory function stable Cardiovascular status: blood pressure returned to baseline and stable Postop Assessment: no apparent nausea or vomiting Anesthetic complications: no   No notable events documented.  Last Vitals:  Vitals:   12/30/20 1409 12/30/20 1415  BP: 132/73 134/73  Pulse: 70 68  Resp: 13 15  Temp: (!) 36.4 C   SpO2: 100% 100%    Last Pain:  Vitals:   12/30/20 1415  TempSrc:   PainSc: 0-No pain                 Pervis Hocking

## 2020-12-30 NOTE — Anesthesia Procedure Notes (Signed)
Procedure Name: LMA Insertion Date/Time: 12/30/2020 1:35 PM Performed by: [place] Charolette Bultman C, CRNA Pre-anesthesia Checklist: Patient identified, Emergency Drugs available, Suction available and Patient being monitored Patient Re-evaluated:Patient Re-evaluated prior to induction Oxygen Delivery Method: Circle system utilized Preoxygenation: Pre-oxygenation with 100% oxygen Induction Type: IV induction Ventilation: Mask ventilation without difficulty LMA: LMA inserted LMA Size: 4.0 Number of attempts: 1 Airway Equipment and Method: Bite block Placement Confirmation: positive ETCO2 Tube secured with: Tape Dental Injury: Teeth and Oropharynx as per pre-operative assessment

## 2020-12-30 NOTE — Op Note (Signed)
Preoperative diagnosis: Urethral stricture  Postoperative diagnosis: Urethral stricture  Procedures: 1.  Cystoscopy 2.  Balloon dilation of urethral stricture (bulbar urethra) 3.  Fulguration of prostatic urethra  Surgeon: Pryor Curia MD  Anesthesia: General  Complications: None  Indication: He has a history of a urethral stricture status post balloon dilation a couple of years ago.  He recently presented with worsening lower urinary tract symptoms and office cystoscopy revealed a recurrent bulbar stricture.  After reviewing options, he elected to proceed with repeat balloon dilation.  The potential risks, complications, and expected recovery process was discussed in detail.  Informed consent was obtained.  Description of procedure: The patient was taken the operating room and general anesthetic was administered.  He was given preoperative antibiotics, placed in the dorsolithotomy position, and prepped and draped in the usual sterile fashion.  Next, a preoperative timeout was performed.  Cystourethroscopy was then performed with a 64 French cystoscope which revealed a small less than 1 cm bulbar urethral stricture.  A 0.38 sensor guidewire was advanced through the stricture into the bladder under fluoroscopic guidance.  The 24 French dilating balloon was then inserted over the wire and positioned under fluoroscopic guidance across the stricture.  This was then inflated to 16 mmHg pressure and left inflated for 5 minutes.  The balloon was then deflated and reinspection of the urethra revealed the urethral stricture to now be dilated allowing the 22 French rigid cystoscope to pass easily.  Inspection of the bladder and prostatic urethra revealed a prominent median lobe of the prostate particular on the left side.  There also was noted to be bleeding from the prostatic urethra.  Inspection the bladder revealed the ureteral orifice ease to be in their expected anatomic location.  There were  no bladder tumors, stones, or other mucosal pathology.  Withdrawing the scope, there continued to be oozing noted from the prostatic urethra in the area of his median lobe.  A Bugbee electrode was therefore used to perform fulguration allowing excellent hemostasis.  The bladder was emptied and the procedure was ended.  He tolerated the procedure well without complications.  He was able to be awakened and transferred to the recovery unit in satisfactory condition.

## 2020-12-31 ENCOUNTER — Encounter: Payer: Medicare Other | Admitting: Physical Therapy

## 2020-12-31 ENCOUNTER — Encounter (HOSPITAL_COMMUNITY): Payer: Self-pay | Admitting: Urology

## 2021-01-04 ENCOUNTER — Ambulatory Visit: Payer: Medicare Other | Attending: Neurology

## 2021-01-04 ENCOUNTER — Other Ambulatory Visit: Payer: Self-pay

## 2021-01-04 DIAGNOSIS — R2681 Unsteadiness on feet: Secondary | ICD-10-CM | POA: Diagnosis not present

## 2021-01-04 DIAGNOSIS — M6281 Muscle weakness (generalized): Secondary | ICD-10-CM | POA: Diagnosis not present

## 2021-01-04 DIAGNOSIS — R293 Abnormal posture: Secondary | ICD-10-CM | POA: Diagnosis not present

## 2021-01-04 DIAGNOSIS — Z9181 History of falling: Secondary | ICD-10-CM | POA: Insufficient documentation

## 2021-01-04 DIAGNOSIS — R2689 Other abnormalities of gait and mobility: Secondary | ICD-10-CM | POA: Diagnosis not present

## 2021-01-04 NOTE — Therapy (Signed)
[facility] @ [address] [place], [place], [address] Phone: [phone]   Fax:  [phone]  Physical Therapy Treatment  Patient Details  Name: Cole Martinez MRN: [id number] Date of Birth: [birth year] Referring Provider (PT): Cole Fast, MD   Encounter Date: 01/04/2021   PT End of Session - 01/04/21 1147     Visit Number 11    Date for PT Re-Evaluation 01/10/21    Authorization Type Medicare A and B    Progress Note Due on Visit 20    PT Start Time 1105    PT Stop Time 1143    PT Time Calculation (min) 38 min    Equipment Utilized During Treatment Gait belt    Activity Tolerance Patient tolerated treatment well;No increased pain             Past Medical History:  Diagnosis Date   ADHD (attention deficit hyperactivity disorder)    B12 deficiency    BPH (benign prostatic hypertrophy)    Chronic lymphocytic leukemia (CLL), 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--  ONCOLOGIST-  DR [place]   PT IS ASYMPTOMATIC--- LAST CBC W/ DIFF 06-24-2012 STABLE   Coronary artery disease CARDIOLOGIST- DR [place]   S/P STENTING LAD 1999 // Myoview 01/2019: EF 62, normal perfusion; Low Risk   Crohn's disease of ileum ([place]) [place]   ED (erectile dysfunction)    Elevated PSA    Essential and other specified forms of tremor 11/25/2012   Gait abnormality 05/17/2020   H/O adenomatous polyp of colon    Hyperlipidemia    Hypertension    Nocturia    OA (osteoarthritis)    Peripheral neuropathy    hx of, none current as of 08-04-13   Peyronie disease    S/P coronary artery stent placement OCT 1999 OF LAD   Tremor, hereditary, benign MILD RIGHT HAND    Past Surgical History:  Procedure Laterality Date   CARDIOVASCULAR STRESS TEST  11-01-2010 DR [place]   NORMAL PERFUSION STUDY/ EF 64%/ NO ISCHEMIA   cataract surgery  Bilateral feburary 2020   with lens placement    COLONOSCOPY WITH PROPOFOL N/A 09/24/2012   Procedure: COLONOSCOPY  WITH PROPOFOL;  Surgeon: [place] Fair, MD;  [facility]: [facility];  Service: Endoscopy;  Laterality: N/A;   CORONARY ANGIOPLASTY WITH STENT PLACEMENT  OCT 1999   STENT OF LAD   CORONARY STENT INTERVENTION N/A 02/14/2019   Procedure: CORONARY STENT INTERVENTION;  Surgeon: [place] Crome, MD;  Location: [facility];  Service: Cardiovascular;  Laterality: N/A;   CYSTOSCOPY WITH URETHRAL DILATATION  03/12/2017   Procedure: CYSTOSCOPY WITH URETHRAL DILATATION;  Surgeon: [place] Arabian, MD;  Location: [facility];  Service: Orthopedics;;  [place], Resident Assisting   CYSTOSCOPY WITH URETHRAL DILATATION N/A 10/03/2018   Procedure: CYSTOSCOPY WITH URETHRAL BALLOON DILATATION WITH BILATERAL RETROGRADE PYELOGRAPHY;  Surgeon: [place] Bring, MD;  [facility]: [facility];  Service: Urology;  Laterality: N/A;   CYSTOSCOPY WITH URETHRAL DILATATION N/A 12/30/2020   Procedure: CYSTOSCOPY WITH BALLOON DILATATION OF URETHRAL STRICTURE;  Surgeon: Cole Bring, MD;  [facility]: [facility];  Service: Urology;  Laterality: N/A;   LAPAROSCOPIC INGUINAL HERNIA REPAIR Bilateral 01-10-2004   W/ MESH   LEFT HEART CATH AND CORONARY ANGIOGRAPHY N/A 02/13/2019   Procedure: LEFT HEART CATH AND CORONARY ANGIOGRAPHY;  Surgeon: [place] Crome, MD;  Location: [facility];  Service: Cardiovascular;  Laterality: N/A;   neck benign removed from neck  yrs  ago   PROSTATE BIOPSY N/A 07/12/2012   Procedure: PROSTATE BIOPSY AND ULTRASOUND;  Surgeon: [place] Rud, MD;  [facility];  Service: Urology;  Laterality: N/A;   PROSTATE SURGERY  2002   tuna   REMOVAL LEFT NECK LYMPH NODE  1996   TONSILLECTOMY  CHILD   TOTAL KNEE ARTHROPLASTY Right 03/12/2017   Procedure: RIGHT TOTAL KNEE ARTHROPLASTY;  Surgeon: [place] Arabian, MD;  [facility]: [facility];  Service: Orthopedics;  Laterality: Right;  Adductor Block   TRANSURETHRAL RESECTION OF PROSTATE N/A 08/08/2013   Procedure: TRANSURETHRAL RESECTION OF THE PROSTATE  WITH GYRUS INSTRUMENTS;  Surgeon: Cole Rud, MD;  [facility]: [facility];  Service: Urology;  Laterality: N/A;   TRANSURETHRAL RESECTION OF PROSTATE      There were no vitals filed for this visit.   Subjective Assessment - 01/04/21 1110     Subjective I wasn't able to do my exercises after I had a procedure and I could tell that I haven't done them.    Currently in Pain? No/denies                               [place] - 01/04/21 0001       Knee/Hip Exercises: Standing   Hip Abduction Stengthening;Both;2 sets;10 reps   standing on balance pad   Walking with Sports Cord 10# forward and reversex10, 5# sidestepping Rt and Lt x8 each- close CGA for safety    Other Standing Knee Exercises standing on foam pad: alternating step-taps on edge of step with bil UE support x20      Knee/Hip Exercises: Seated   Sit to Sand 20 reps;without UE support   holding 5# kettlebell                Balance Exercises - 01/04/21 0001       Balance Exercises: Standing   Tandem Stance Foam/compliant surface;Upper extremity support 1;3 reps;20 secs    Other Standing Exercises box stepping with discs on floor x 5 laps each direction-gait belt used for safety.  .                  PT Short Term Goals - 11/24/20 1151       PT SHORT TERM GOAL #1   Title Pt will be independent with his initial HEP to improve strength and balance.    Status Achieved               PT Long Term Goals - 12/28/20 1108       PT LONG TERM GOAL #2   Title Pt will have atleast 6 point increase on the BERG to reflect a significant improvement in his balance and decreased risk of falling.    Baseline met    Status On-going      PT LONG TERM GOAL #3   Title Pt will be independent with a regular/advanced LE strengthening program at his gym in order to maintain progress made during his POC.    Baseline Independent in balance and gym exercises as they are  currently-further advancement is needed    Time 8    Period Weeks    Status On-going      PT LONG TERM GOAL #4   Title Pt will score greater than 19 points on the DGI to reflect a decrease in his risk of falling and injuring himself.    Baseline --  Time 8    Period Weeks    Status On-going      PT LONG TERM GOAL #5   Title --                   Plan - 01/04/21 1146     Clinical Impression Statement Pt was not able to exercise since last visit due to medical procedure that he had to rest after.  Pt demonstrates proximal hip instability and has difficulty stabilizing against external forces or change of direction.  Pt required close CGA for balance activities for safety.  Pt will continue to benefit from skilled PT to address balance, posture and endurance.    PT Frequency 2x / week    PT Duration 8 weeks    PT Treatment/Interventions ADLs/Self Care Home Management;Gait training;Functional mobility training;Therapeutic activities;Therapeutic exercise;Neuromuscular re-education;Balance training;Stair training;Patient/family education;Manual techniques;Passive range of motion;Aquatic Therapy    PT Next Visit Plan gait, balance and endurance, posture    PT Home Exercise Plan Access Code: [email or web address]    Consulted and Agree with Plan of Care Patient             Patient will benefit from skilled therapeutic intervention in order to improve the following deficits and impairments:  Abnormal gait, Decreased balance, Difficulty walking, Decreased endurance, Decreased strength, Decreased safety awareness, Impaired flexibility  Visit Diagnosis: Unsteadiness on feet  Muscle weakness (generalized)  Other abnormalities of gait and mobility  Abnormal posture     Problem List Patient Active Problem List   Diagnosis Date Noted   Adult attention deficit disorder 06/04/2020   Chronic lymphocytic leukemia ([place]) 06/04/2020   Conductive hearing loss, bilateral 06/04/2020   Crohn's  ileitis ([place]) 06/04/2020   Disorder of musculoskeletal system 06/04/2020   Erectile dysfunction 06/04/2020   Gastroesophageal reflux disease 06/04/2020   Gout 06/04/2020   History of adenomatous polyp of colon 06/04/2020   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   Hypothyroidism 06/04/2020   Increased frequency of urination 06/04/2020   Male hypogonadism 06/04/2020   Muscle pain 06/04/2020   Nocturia 06/04/2020   Obesity 06/04/2020   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   Osteopenia 06/04/2020   Peripheral neurogenic pain 06/04/2020   Pure hypercholesterolemia 06/04/2020   Recurrent falls 06/04/2020   Unspecified kyphosis, thoracic region 06/04/2020   Vitamin B12 deficiency 06/04/2020   Vitamin D deficiency 06/04/2020   Gait abnormality 05/17/2020   Ataxia 03/16/2020   Angina pectoris ([place]) 02/13/2019   Pseudophakia of both eyes 08/01/2018   History of total knee arthroplasty 03/29/2017   Stiffness of right knee 03/16/2017   OA (osteoarthritis) of knee 03/12/2017   Tremor, essential 12/03/2015   Essential hypertension 12/09/2014   Coronary artery disease involving native heart 12/08/2013   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

## 2021-01-07 ENCOUNTER — Other Ambulatory Visit: Payer: Self-pay

## 2021-01-07 ENCOUNTER — Encounter: Payer: Self-pay | Admitting: Physical Therapy

## 2021-01-07 ENCOUNTER — Ambulatory Visit: Payer: Medicare Other | Admitting: Physical Therapy

## 2021-01-07 DIAGNOSIS — R2681 Unsteadiness on feet: Secondary | ICD-10-CM

## 2021-01-07 DIAGNOSIS — R293 Abnormal posture: Secondary | ICD-10-CM | POA: Diagnosis not present

## 2021-01-07 DIAGNOSIS — R2689 Other abnormalities of gait and mobility: Secondary | ICD-10-CM | POA: Diagnosis not present

## 2021-01-07 DIAGNOSIS — M6281 Muscle weakness (generalized): Secondary | ICD-10-CM | POA: Diagnosis not present

## 2021-01-07 DIAGNOSIS — Z9181 History of falling: Secondary | ICD-10-CM

## 2021-01-07 NOTE — Therapy (Signed)
[facility] @ [address] [place] [address], [place], [address] Phone: [phone]   Fax:  [phone]  Physical Therapy Treatment  Patient Details  Name: [place] MRN: [id number] Date of Birth: 1945/12/10 Referring Provider (PT): Margette Fast, MD   Encounter Date: 01/07/2021   PT End of Session - 01/07/21 1013     Visit Number 12    Date for PT Re-Evaluation 01/10/21    Authorization Type Medicare A and B    Authorization Time Period 11/15/20 to 01/10/21    Authorization - Visit Number 9    Progress Note Due on Visit 20    PT Start Time 1012    PT Stop Time 1050    PT Time Calculation (min) 38 min    Activity Tolerance Patient tolerated treatment well;No increased pain    Behavior During Therapy WFL for tasks assessed/performed             Past Medical History:  Diagnosis Date   ADHD (attention deficit hyperactivity disorder)    B12 deficiency    BPH (benign prostatic hypertrophy)    Chronic lymphocytic leukemia (CLL), 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--  ONCOLOGIST-  DR Benay Spice   PT IS ASYMPTOMATIC--- LAST CBC W/ DIFF 06-24-2012 STABLE   Coronary artery disease CARDIOLOGIST- DR Daneen Schick   S/P STENTING LAD 1999 // Myoview 01/2019: EF 62, normal perfusion; Low Risk   Crohn's disease of ileum ([place]) [place]   ED (erectile dysfunction)    Elevated PSA    Essential and other specified forms of tremor 11/25/2012   Gait abnormality 05/17/2020   H/O adenomatous polyp of colon    Hyperlipidemia    Hypertension    Nocturia    OA (osteoarthritis)    Peripheral neuropathy    hx of, none current as of 08-04-13   Peyronie disease    S/P coronary artery stent placement OCT 1999 OF LAD   Tremor, hereditary, benign MILD RIGHT HAND    Past Surgical History:  Procedure Laterality Date   CARDIOVASCULAR STRESS TEST  11-01-2010 DR Daneen Schick   NORMAL PERFUSION STUDY/ EF 64%/ NO ISCHEMIA   cataract surgery  Bilateral feburary 2020    with lens placement    COLONOSCOPY WITH PROPOFOL N/A 09/24/2012   Procedure: COLONOSCOPY WITH PROPOFOL;  Surgeon: [name], MD;  [facility];  Service: Endoscopy;  Laterality: N/A;   CORONARY ANGIOPLASTY WITH STENT PLACEMENT  OCT 1999   STENT OF LAD   CORONARY STENT INTERVENTION N/A 02/14/2019   Procedure: CORONARY STENT INTERVENTION;  Surgeon: Belva Crome, MD;  Location: [facility];  Service: Cardiovascular;  Laterality: N/A;   CYSTOSCOPY WITH URETHRAL DILATATION  03/12/2017   Procedure: CYSTOSCOPY WITH URETHRAL DILATATION;  Surgeon: Gaynelle Arabian, MD;  [facility];  Service: Orthopedics;;  Ammie Dalton, Resident Assisting   CYSTOSCOPY WITH URETHRAL DILATATION N/A 10/03/2018   Procedure: CYSTOSCOPY WITH URETHRAL BALLOON DILATATION WITH BILATERAL RETROGRADE PYELOGRAPHY;  Surgeon: Raynelle Bring, MD;  [facility];  Service: Urology;  Laterality: N/A;   CYSTOSCOPY WITH URETHRAL DILATATION N/A 12/30/2020   Procedure: CYSTOSCOPY WITH BALLOON DILATATION OF URETHRAL STRICTURE;  Surgeon: Raynelle Bring, MD;  [facility];  Service: Urology;  Laterality: N/A;   LAPAROSCOPIC INGUINAL HERNIA REPAIR Bilateral 01-10-2004   W/ MESH   LEFT HEART CATH AND CORONARY ANGIOGRAPHY N/A 02/13/2019   Procedure: LEFT HEART CATH AND CORONARY ANGIOGRAPHY;  Surgeon: Belva Crome, MD;  [facility];  Service: Cardiovascular;  Laterality: N/A;   neck benign removed from neck  yrs ago   PROSTATE BIOPSY N/A 07/12/2012   Procedure: PROSTATE BIOPSY AND ULTRASOUND;  Surgeon: Ailene Rud, MD;  [facility];  Service: Urology;  Laterality: N/A;   PROSTATE SURGERY  2002   tuna   REMOVAL LEFT NECK LYMPH NODE  1996   TONSILLECTOMY  CHILD   TOTAL KNEE ARTHROPLASTY Right 03/12/2017   Procedure: RIGHT TOTAL KNEE ARTHROPLASTY;  Surgeon: Gaynelle Arabian, MD;  [facility];  Service: Orthopedics;  Laterality: Right;  Adductor Block    TRANSURETHRAL RESECTION OF PROSTATE N/A 08/08/2013   Procedure: TRANSURETHRAL RESECTION OF THE PROSTATE WITH GYRUS INSTRUMENTS;  Surgeon: Ailene Rud, MD;  [facility];  Service: Urology;  Laterality: N/A;   TRANSURETHRAL RESECTION OF PROSTATE      There were no vitals filed for this visit.   Subjective Assessment - 01/07/21 1015     Subjective Walked this AM, no new complaints. At times I feel wobbly.    Currently in Pain? No/denies    Multiple Pain Sites No                [facility] PT Assessment - 01/07/21 0001       Strength   Right Hip Flexion 4-/5    Right Hip ABduction 3/5    Left Hip Flexion 4/5    Left Hip ABduction 3+/5   Pt was able to hold correct test position much better                          [facility] Adult PT Treatment/Exercise - 01/07/21 0001       Knee/Hip Exercises: Aerobic   Nustep L3 x 10 min,      Knee/Hip Exercises: Supine   Bridges Strengthening;Both;1 set;5 reps                       PT Short Term Goals - 11/24/20 1151       PT SHORT TERM GOAL #1   Title Pt will be independent with his initial HEP to improve strength and balance.    Status Achieved               PT Long Term Goals - 01/07/21 1055       PT LONG TERM GOAL #1   Title Pt will have greater than 4/5 MMT strength of BLE which will improve his gait mechanics and balance reactions in standing.    Time 8    Period Weeks    Status On-going                   Plan - 01/07/21 1017     Clinical Impression Statement Hip flexion MMT improved since eval, hip abduction slight improvement. Pt continues to demonstrate unsteadiness with dynamic balance exercises. Pt was educated how spinal stenosis and other spacial issues in hs spine effect his muscle strength.    Personal Factors and Comorbidities Age;Time since onset of injury/illness/exacerbation    Examination-Activity Limitations Locomotion Level;Stairs     Examination-Participation Restrictions Community Activity    Stability/Clinical Decision Making Stable/Uncomplicated    Rehab Potential Good    PT Frequency 2x / week    PT Duration 8 weeks    PT Treatment/Interventions ADLs/Self Care Home Management;Gait training;Functional mobility training;Therapeutic activities;Therapeutic exercise;Neuromuscular re-education;Balance training;Stair training;Patient/family education;Manual techniques;Passive range of motion;Aquatic  Therapy    PT Next Visit Plan ERO next, pt will be 1 day over his auth period.    PT Home Exercise Plan Access Code: [email or web address]    Consulted and Agree with Plan of Care Patient             Patient will benefit from skilled therapeutic intervention in order to improve the following deficits and impairments:  Abnormal gait, Decreased balance, Difficulty walking, Decreased endurance, Decreased strength, Decreased safety awareness, Impaired flexibility  Visit Diagnosis: Unsteadiness on feet  Muscle weakness (generalized)  Other abnormalities of gait and mobility  Abnormal posture  History of falling     Problem List Patient Active Problem List   Diagnosis Date Noted   Adult attention deficit disorder 06/04/2020   Chronic lymphocytic leukemia ([place]) 06/04/2020   Conductive hearing loss, bilateral 06/04/2020   Crohn's ileitis ([place]) 06/04/2020   Disorder of musculoskeletal system 06/04/2020   Erectile dysfunction 06/04/2020   Gastroesophageal reflux disease 06/04/2020   Gout 06/04/2020   History of adenomatous polyp of colon 06/04/2020   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   Hypothyroidism 06/04/2020   Increased frequency of urination 06/04/2020   Male hypogonadism 06/04/2020   Muscle pain 06/04/2020   Nocturia 06/04/2020   Obesity 06/04/2020   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   Osteopenia 06/04/2020   Peripheral neurogenic pain 06/04/2020   Pure hypercholesterolemia 06/04/2020   Recurrent  falls 06/04/2020   Unspecified kyphosis, thoracic region 06/04/2020   Vitamin B12 deficiency 06/04/2020   Vitamin D deficiency 06/04/2020   Gait abnormality 05/17/2020   Ataxia 03/16/2020   Angina pectoris ([place]) 02/13/2019   Pseudophakia of both eyes 08/01/2018   History of total knee arthroplasty 03/29/2017   Stiffness of right knee 03/16/2017   OA (osteoarthritis) of knee 03/12/2017   Tremor, essential 12/03/2015   Essential hypertension 12/09/2014   Coronary artery disease involving native heart 12/08/2013   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

## 2021-01-13 ENCOUNTER — Other Ambulatory Visit: Payer: Self-pay

## 2021-01-13 ENCOUNTER — Ambulatory Visit: Payer: Medicare Other

## 2021-01-13 ENCOUNTER — Other Ambulatory Visit (HOSPITAL_COMMUNITY): Payer: Self-pay

## 2021-01-13 DIAGNOSIS — Z9181 History of falling: Secondary | ICD-10-CM | POA: Diagnosis not present

## 2021-01-13 DIAGNOSIS — R2681 Unsteadiness on feet: Secondary | ICD-10-CM | POA: Diagnosis not present

## 2021-01-13 DIAGNOSIS — R2689 Other abnormalities of gait and mobility: Secondary | ICD-10-CM | POA: Diagnosis not present

## 2021-01-13 DIAGNOSIS — M6281 Muscle weakness (generalized): Secondary | ICD-10-CM | POA: Diagnosis not present

## 2021-01-13 DIAGNOSIS — R293 Abnormal posture: Secondary | ICD-10-CM | POA: Diagnosis not present

## 2021-01-13 DIAGNOSIS — Z20822 Contact with and (suspected) exposure to covid-19: Secondary | ICD-10-CM | POA: Diagnosis not present

## 2021-01-13 NOTE — Therapy (Signed)
[facility] @ [facility] [address] [place], [place], [address] Phone: [phone]   Fax:  [phone]  Physical Therapy Treatment  Patient Details  Name: Cole Martinez MRN: [id number] Date of Birth: August 07, 1945 Referring Provider (PT): Margette Fast, MD   Encounter Date: 01/13/2021   PT End of Session - 01/13/21 1310     Visit Number 13    PT Start Time 6195    PT Stop Time 0932    PT Time Calculation (min) 35 min    Activity Tolerance Patient tolerated treatment well;No increased pain    Behavior During Therapy WFL for tasks assessed/performed             Past Medical History:  Diagnosis Date   ADHD (attention deficit hyperactivity disorder)    B12 deficiency    BPH (benign prostatic hypertrophy)    Chronic lymphocytic leukemia (CLL), 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--  ONCOLOGIST-  DR Benay Spice   PT IS ASYMPTOMATIC--- LAST CBC W/ DIFF 06-24-2012 STABLE   Coronary artery disease CARDIOLOGIST- DR Daneen Schick   S/P STENTING LAD 1999 // Myoview 01/2019: EF 62, normal perfusion; Low Risk   Crohn's disease of ileum ([place]) [place]   ED (erectile dysfunction)    Elevated PSA    Essential and other specified forms of tremor 11/25/2012   Gait abnormality 05/17/2020   H/O adenomatous polyp of colon    Hyperlipidemia    Hypertension    Nocturia    OA (osteoarthritis)    Peripheral neuropathy    hx of, none current as of 08-04-13   Peyronie disease    S/P coronary artery stent placement OCT 1999 OF LAD   Tremor, hereditary, benign MILD RIGHT HAND    Past Surgical History:  Procedure Laterality Date   CARDIOVASCULAR STRESS TEST  11-01-2010 DR Daneen Schick   NORMAL PERFUSION STUDY/ EF 64%/ NO ISCHEMIA   cataract surgery  Bilateral feburary 2020   with lens placement    COLONOSCOPY WITH PROPOFOL N/A 09/24/2012   Procedure: COLONOSCOPY WITH PROPOFOL;  Surgeon: [name], MD;  Location: [facility];  Service: Endoscopy;   Laterality: N/A;   CORONARY ANGIOPLASTY WITH STENT PLACEMENT  OCT 1999   STENT OF LAD   CORONARY STENT INTERVENTION N/A 02/14/2019   Procedure: CORONARY STENT INTERVENTION;  Surgeon: Belva Crome, MD;  Location: [facility];  Service: Cardiovascular;  Laterality: N/A;   CYSTOSCOPY WITH URETHRAL DILATATION  03/12/2017   Procedure: CYSTOSCOPY WITH URETHRAL DILATATION;  Surgeon: Gaynelle Arabian, MD;  Location: [facility];  Service: Orthopedics;;  Ammie Dalton, Resident Assisting   CYSTOSCOPY WITH URETHRAL DILATATION N/A 10/03/2018   Procedure: CYSTOSCOPY WITH URETHRAL BALLOON DILATATION WITH BILATERAL RETROGRADE PYELOGRAPHY;  Surgeon: Raynelle Bring, MD;  Location: [facility];  Service: Urology;  Laterality: N/A;   CYSTOSCOPY WITH URETHRAL DILATATION N/A 12/30/2020   Procedure: CYSTOSCOPY WITH BALLOON DILATATION OF URETHRAL STRICTURE;  Surgeon: Raynelle Bring, MD;  Location: [facility];  Service: Urology;  Laterality: N/A;   LAPAROSCOPIC INGUINAL HERNIA REPAIR Bilateral 01-10-2004   W/ MESH   LEFT HEART CATH AND CORONARY ANGIOGRAPHY N/A 02/13/2019   Procedure: LEFT HEART CATH AND CORONARY ANGIOGRAPHY;  Surgeon: Belva Crome, MD;  Location: [facility];  Service: Cardiovascular;  Laterality: N/A;   neck benign removed from neck  yrs ago   PROSTATE BIOPSY N/A 07/12/2012   Procedure: PROSTATE BIOPSY AND ULTRASOUND;  Surgeon: Ailene Rud, MD;  Location: [facility];  Service: Urology;  Laterality: N/A;   PROSTATE SURGERY  2002   tuna   REMOVAL LEFT NECK LYMPH NODE  1996   TONSILLECTOMY  CHILD   TOTAL KNEE ARTHROPLASTY Right 03/12/2017   Procedure: RIGHT TOTAL KNEE ARTHROPLASTY;  Surgeon: Gaynelle Arabian, MD;  Location: [facility];  Service: Orthopedics;  Laterality: Right;  Adductor Block   TRANSURETHRAL RESECTION OF PROSTATE N/A 08/08/2013   Procedure: TRANSURETHRAL RESECTION OF THE PROSTATE WITH GYRUS INSTRUMENTS;  Surgeon: Ailene Rud, MD;  Location: [facility];  Service:  Urology;  Laterality: N/A;   TRANSURETHRAL RESECTION OF PROSTATE      There were no vitals filed for this visit.   Subjective Assessment - 01/13/21 1236     Subjective I feel 60% better with my balance.  Most challenged in the yard with the dog.    Patient Stated Goals improve steadiness of his gait    Currently in Pain? No/denies                [facility] PT Assessment - 01/13/21 0001       Assessment   Medical Diagnosis gait instability    Referring Provider (PT) Margette Fast, MD      Cognition   Overall Cognitive Status Within Functional Limits for tasks assessed      Strength   Right Hip Flexion 4/5    Right Hip ABduction 3+/5    Left Hip Flexion 4+/5    Left Hip ABduction 4/5      Berg Balance Test   Sit to Stand Able to stand without using hands and stabilize independently    Standing Unsupported Able to stand safely 2 minutes    Sitting with Back Unsupported but Feet Supported on Floor or Stool Able to sit safely and securely 2 minutes    Stand to Sit Sits safely with minimal use of hands    Transfers Able to transfer safely, minor use of hands    Standing Unsupported with Eyes Closed Able to stand 10 seconds with supervision    Standing Unsupported with Feet Together Able to place feet together independently and stand 1 minute safely    From Standing, Reach Forward with Outstretched Arm Can reach confidently >25 cm (10")    From Standing Position, Pick up Object from Floor Able to pick up shoe safely and easily    From Standing Position, Turn to Look Behind Over each Shoulder Looks behind one side only/other side shows less weight shift    Turn 360 Degrees Able to turn 360 degrees safely but slowly    Standing Unsupported, Alternately Place Feet on Step/Stool Able to stand independently and complete 8 steps >20 seconds    Standing Unsupported, One Foot in Front Able to plae foot ahead of the other independently and hold 30 seconds    Standing on One Leg Able to  lift leg independently and hold 5-10 seconds    Total Score 49    Berg comment: reduced falls risk      Dynamic Gait Index   Level Surface Mild Impairment    Change in Gait Speed Moderate Impairment    Gait with Horizontal Head Turns Moderate Impairment    Gait with Vertical Head Turns Mild Impairment    Gait and Pivot Turn Moderate Impairment    Step Over Obstacle Mild Impairment    Step Around Obstacles Normal    Steps Mild Impairment    Total Score 14    DGI comment: improved  falls risk                                    PT Education - 01/13/21 1310     Education Details verbal review of all HEP with pt    Person(s) Educated Patient    Methods Explanation;Demonstration;Handout    Comprehension Verbalized understanding;Returned demonstration              PT Short Term Goals - 11/24/20 1151       PT SHORT TERM GOAL #1   Title Pt will be independent with his initial HEP to improve strength and balance.    Status Achieved               PT Long Term Goals - 01/13/21 1244       PT LONG TERM GOAL #1   Title Pt will have greater than 4/5 MMT strength of BLE which will improve his gait mechanics and balance reactions in standing.    Status Partially Met      PT LONG TERM GOAL #2   Title Pt will have atleast 6 point increase on the BERG to reflect a significant improvement in his balance and decreased risk of falling.    Status Achieved      PT LONG TERM GOAL #3   Title Pt will be independent with a regular/advanced LE strengthening program at his gym in order to maintain progress made during his POC.    Status Achieved      PT LONG TERM GOAL #4   Title Pt will score greater than 19 points on the DGI to reflect a decrease in his risk of falling and injuring himself.    Baseline 14    Status Partially Met                   Plan - 01/13/21 1304     Clinical Impression Statement Pt reports 60% overall improvement in balance  since the start of care. Berg and DGI scores are improved indicating improved balance and pt remains at a falls risk based on these tests.  Bil hip strength is improved overall.  Pt exercises regularly and will continue with his program for continued gains.  Pt has another Comptroller when at neurorehab that he continues to do as well.  Pt will D/C to HEP today.    PT Frequency --    PT Treatment/Interventions --    PT Next Visit Plan D/C PT to day    PT Home Exercise Plan Access Code: [email or web address]    Consulted and Agree with Plan of Care Patient             Patient will benefit from skilled therapeutic intervention in order to improve the following deficits and impairments:     Visit Diagnosis: Unsteadiness on feet - Plan: PT plan of care cert/re-cert  Muscle weakness (generalized) - Plan: PT plan of care cert/re-cert  Other abnormalities of gait and mobility - Plan: PT plan of care cert/re-cert  Abnormal posture - Plan: PT plan of care cert/re-cert  History of falling - Plan: PT plan of care cert/re-cert     Problem List Patient Active Problem List   Diagnosis Date Noted   Adult attention deficit disorder 06/04/2020   Chronic lymphocytic leukemia ([place]) 06/04/2020   Conductive hearing loss, bilateral 06/04/2020   Crohn's ileitis ([place]) 06/04/2020   Disorder of  musculoskeletal system 06/04/2020   Erectile dysfunction 06/04/2020   Gastroesophageal reflux disease 06/04/2020   Gout 06/04/2020   History of adenomatous polyp of colon 06/04/2020   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   Hypothyroidism 06/04/2020   Increased frequency of urination 06/04/2020   Male hypogonadism 06/04/2020   Muscle pain 06/04/2020   Nocturia 06/04/2020   Obesity 06/04/2020   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   Osteopenia 06/04/2020   Peripheral neurogenic pain 06/04/2020   Pure hypercholesterolemia 06/04/2020   Recurrent falls 06/04/2020   Unspecified kyphosis,  thoracic region 06/04/2020   Vitamin B12 deficiency 06/04/2020   Vitamin D deficiency 06/04/2020   Gait abnormality 05/17/2020   Ataxia 03/16/2020   Angina pectoris ([place]) 02/13/2019   Pseudophakia of both eyes 08/01/2018   History of total knee arthroplasty 03/29/2017   Stiffness of right knee 03/16/2017   OA (osteoarthritis) of knee 03/12/2017   Tremor, essential 12/03/2015   Essential hypertension 12/09/2014   Coronary artery disease involving native heart 12/08/2013   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     Remaining deficits: Postural and balance deficits of a chronic nature.  Pt has HEP in place and will continue to address these issues.    Education / Equipment: HEP   Patient agrees to discharge. Patient goals were partially met. Patient is being discharged due to being pleased with the current functional level.  Sigurd Sos, PT 01/13/21 1:12 PM   [facility] @ [address] [place], [place], [address] Phone: [phone]   Fax:  [phone]  Name: Cole Martinez MRN: [id number] Date of Birth: 11/18/1945

## 2021-01-13 NOTE — Patient Instructions (Signed)
Access Code: [email or web address] URL: [email or web address] Date: 01/13/2021 Prepared by: Claiborne Billings  Exercises Beginner Bridge - 1 x daily - 7 x weekly - 3 sets - 10 reps Clam with Resistance - 1 x daily - 7 x weekly - 3 sets - 10 reps Clam with Resistance (Mirrored) - 1 x daily - 7 x weekly - 3 sets - 10 reps Long Sitting Isometric Hip Abduction with Ball at [employer] - 2 x daily - 7 x weekly - 1 sets - 10 reps - 5 hold Sit to Stand with Resistance Around Legs - 1 x daily - 7 x weekly - 2 sets - 10 reps Seated Hip Flexion March with Ankle Weights - 1 x daily - 7 x weekly - 2 sets - 20 reps Lateral Step Up with Counter Support - 1 x daily - 7 x weekly - 2 sets - 10 reps Forward Step Up with Unilateral Counter Support - 1 x daily - 7 x weekly - 2 sets - 10 reps

## 2021-01-19 ENCOUNTER — Other Ambulatory Visit: Payer: Self-pay | Admitting: Interventional Cardiology

## 2021-01-26 ENCOUNTER — Other Ambulatory Visit (HOSPITAL_COMMUNITY): Payer: Self-pay

## 2021-01-26 MED ORDER — AMPHETAMINE-DEXTROAMPHETAMINE 5 MG PO TABS
ORAL_TABLET | ORAL | 0 refills | Status: DC
  Filled 2021-01-26 – 2021-06-29 (×3): qty 60, 30d supply, fill #0

## 2021-02-01 DIAGNOSIS — N401 Enlarged prostate with lower urinary tract symptoms: Secondary | ICD-10-CM | POA: Diagnosis not present

## 2021-02-01 DIAGNOSIS — Z03818 Encounter for observation for suspected exposure to other biological agents ruled out: Secondary | ICD-10-CM | POA: Diagnosis not present

## 2021-02-01 DIAGNOSIS — I10 Essential (primary) hypertension: Secondary | ICD-10-CM | POA: Diagnosis not present

## 2021-02-01 DIAGNOSIS — R051 Acute cough: Secondary | ICD-10-CM | POA: Diagnosis not present

## 2021-02-01 DIAGNOSIS — R350 Frequency of micturition: Secondary | ICD-10-CM | POA: Diagnosis not present

## 2021-02-01 DIAGNOSIS — N35011 Post-traumatic bulbous urethral stricture: Secondary | ICD-10-CM | POA: Diagnosis not present

## 2021-02-01 DIAGNOSIS — J069 Acute upper respiratory infection, unspecified: Secondary | ICD-10-CM | POA: Diagnosis not present

## 2021-02-07 ENCOUNTER — Other Ambulatory Visit: Payer: Self-pay | Admitting: Interventional Cardiology

## 2021-02-17 ENCOUNTER — Other Ambulatory Visit (HOSPITAL_COMMUNITY): Payer: Self-pay

## 2021-02-24 DIAGNOSIS — H52203 Unspecified astigmatism, bilateral: Secondary | ICD-10-CM | POA: Diagnosis not present

## 2021-02-24 DIAGNOSIS — Z961 Presence of intraocular lens: Secondary | ICD-10-CM | POA: Diagnosis not present

## 2021-02-24 DIAGNOSIS — H524 Presbyopia: Secondary | ICD-10-CM | POA: Diagnosis not present

## 2021-03-11 ENCOUNTER — Telehealth: Payer: Self-pay | Admitting: Neurology

## 2021-03-11 NOTE — Telephone Encounter (Signed)
Pt is needing a refill on his primidone (MYSOLINE) 50 MG tablet but is needing it sent to the [facility] in [facility] from now on.

## 2021-03-14 MED ORDER — PRIMIDONE 50 MG PO TABS: 50.0000 mg | ORAL_TABLET | Freq: Every day | ORAL | 0 refills | Status: DC

## 2021-03-14 NOTE — Telephone Encounter (Signed)
I called and LMVM for pt that returning call about his primidone.  Would fill for him at the [facility]. Primidone 7m po qhs.  Has appt with Dr. ARexene Alberts1-24-23

## 2021-03-29 ENCOUNTER — Ambulatory Visit (INDEPENDENT_AMBULATORY_CARE_PROVIDER_SITE_OTHER): Payer: Medicare Other | Admitting: Neurology

## 2021-03-29 ENCOUNTER — Encounter: Payer: Self-pay | Admitting: Neurology

## 2021-03-29 VITALS — BP 139/78 | HR 58 | Ht 67.25 in | Wt 179.4 lb

## 2021-03-29 DIAGNOSIS — G250 Essential tremor: Secondary | ICD-10-CM

## 2021-03-29 DIAGNOSIS — R2689 Other abnormalities of gait and mobility: Secondary | ICD-10-CM | POA: Diagnosis not present

## 2021-03-29 DIAGNOSIS — R269 Unspecified abnormalities of gait and mobility: Secondary | ICD-10-CM

## 2021-03-29 MED ORDER — PRIMIDONE 50 MG PO TABS: 50.0000 mg | ORAL_TABLET | Freq: Every day | ORAL | 3 refills | Status: DC
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
## 2021-03-29 NOTE — Patient Instructions (Addendum)
It was nice to meet you today. As discussed, we can continue with Mysoline 50 mg at bedtime, we can cautiously increase it to 75 mg at bedtime if you want to try it but it can affect your sleepiness and balance. I would like for you to discuss with your cardiologist the possibility of increasing the beta-blocker, metoprolol.  A beta-blocker can help with tremor control as well but we have to be mindful of the beta-blocker reducing blood pressure and pulse, your pulse rate is below 60 at this time and increasing the metoprolol may reduce your heart rate too much.  Please try to hydrate better with water, better hydration may help tremor control, and your balance as well as mental sharpness.  I would recommend 4 to 6 cups of water per day.  Please continue to limit your caffeine to 1 or 2 servings per day. I would recommend that you limit your alcohol intake to less than 1 serving per day.  I recommend that you start using a cane for gait safety. Please follow-up routinely in 6 months in this clinic.

## 2021-03-31 DIAGNOSIS — Z20822 Contact with and (suspected) exposure to covid-19: Secondary | ICD-10-CM | POA: Diagnosis not present

## 2021-04-04 ENCOUNTER — Ambulatory Visit: Payer: Medicare Other | Admitting: Podiatry

## 2021-04-21 ENCOUNTER — Other Ambulatory Visit: Payer: Self-pay | Admitting: Interventional Cardiology

## 2021-04-27 ENCOUNTER — Ambulatory Visit (INDEPENDENT_AMBULATORY_CARE_PROVIDER_SITE_OTHER): Payer: Medicare Other | Admitting: Dermatology

## 2021-04-27 ENCOUNTER — Other Ambulatory Visit: Payer: Self-pay

## 2021-04-27 ENCOUNTER — Encounter: Payer: Self-pay | Admitting: Dermatology

## 2021-04-27 DIAGNOSIS — I872 Venous insufficiency (chronic) (peripheral): Secondary | ICD-10-CM

## 2021-04-27 DIAGNOSIS — L821 Other seborrheic keratosis: Secondary | ICD-10-CM | POA: Diagnosis not present

## 2021-04-27 DIAGNOSIS — Z1283 Encounter for screening for malignant neoplasm of skin: Secondary | ICD-10-CM | POA: Diagnosis not present

## 2021-04-27 DIAGNOSIS — D485 Neoplasm of uncertain behavior of skin: Secondary | ICD-10-CM

## 2021-04-27 MED ORDER — CLOBETASOL PROP EMOLLIENT BASE 0.05 % EX CREA: 1.0000 "application " | TOPICAL_CREAM | Freq: Two times a day (BID) | CUTANEOUS | 1 refills | Status: AC

## 2021-04-27 NOTE — Patient Instructions (Signed)

## 2021-04-29 DIAGNOSIS — Z03818 Encounter for observation for suspected exposure to other biological agents ruled out: Secondary | ICD-10-CM | POA: Diagnosis not present

## 2021-04-29 DIAGNOSIS — J069 Acute upper respiratory infection, unspecified: Secondary | ICD-10-CM | POA: Diagnosis not present

## 2021-05-08 ENCOUNTER — Encounter: Payer: Self-pay | Admitting: Dermatology

## 2021-05-09 NOTE — Progress Notes (Signed)
° °  Follow-Up Visit   Subjective  Cole Martinez is a 76 y.o. male who presents for the following: Annual Exam (Lesion on left side of neck x 1 year. Lesion on left lower leg x 5 months. Dry skin on right ankle. ).  General skin examination, growth on left lower leg and dark spot on left collarbone. Location:  Duration:  Quality:  Associated Signs/Symptoms: Modifying Factors:  Severity:  Timing: Context:   Objective  Well appearing patient in no apparent distress; mood and affect are within normal limits. Upper Body General skin examination: On 1 atypical pigmented lesion left arm will be biopsied.  Lichenified 4 mm papule left shin could represent pickers nodule or wart or irritated seborrheic keratoses (no intervention unless clinical change).  Left Clavicle Black 5 mm macule, dermoscopy amorphous       Right Lower Leg - Anterior Some edema plus hemosiderosis plus inflammation compatible with stasis dermatitis.    A full examination was performed including scalp, head, eyes, ears, nose, lips, neck, chest, axillae, abdomen, back, buttocks, bilateral upper extremities, bilateral lower extremities, hands, feet, fingers, toes, fingernails, and toenails. All findings within normal limits unless otherwise noted below.  Areas beneath undergarment not fully examined   Assessment & Plan    Screening exam for skin cancer Upper Body  Annual skin examination, encouraged to self examine with spouse twice annually.  Neoplasm of uncertain behavior of skin Left Clavicle  Skin / nail biopsy Type of biopsy: tangential   Informed consent: discussed and consent obtained   Timeout: patient name, date of birth, surgical site, and procedure verified   Anesthesia: the lesion was anesthetized in a standard fashion   Instrument used: flexible razor blade   Hemostasis achieved with: ferric subsulfate and electrodesiccation   Outcome: patient tolerated procedure well   Post-procedure  details: wound care instructions given    Specimen 1 - Surgical pathology Differential Diagnosis: R/O BCC VS SCC  Check Margins: No  Venous stasis dermatitis of right lower extremity Right Lower Leg - Anterior  Discussed ways to minimize venous pressure in his legs.  Can use clobetasol after bathing on inflamed areas daily for 1 month then on a as needed basis.  Do not use on other areas.  Related Medications Clobetasol Prop Emollient Base (CLOBETASOL PROPIONATE E) 0.05 % emollient cream Apply 1 application topically 2 (two) times daily.  Venous stasis dermatitis of left lower extremity Left Lower Leg - Anterior  Related Medications Clobetasol Prop Emollient Base (CLOBETASOL PROPIONATE E) 0.05 % emollient cream Apply 1 application topically 2 (two) times daily.      I, Lavonna Monarch, MD, have reviewed all documentation for this visit.  The documentation on 05/09/21 for the exam, diagnosis, procedures, and orders are all accurate and complete.

## 2021-05-17 DIAGNOSIS — Z20822 Contact with and (suspected) exposure to covid-19: Secondary | ICD-10-CM | POA: Diagnosis not present

## 2021-05-30 ENCOUNTER — Other Ambulatory Visit (HOSPITAL_COMMUNITY): Payer: Self-pay

## 2021-05-30 MED ORDER — AMPHETAMINE-DEXTROAMPHETAMINE 5 MG PO TABS
ORAL_TABLET | ORAL | 0 refills | Status: DC
  Filled 2021-05-30: qty 60, 30d supply, fill #0

## 2021-06-04 DIAGNOSIS — Z20822 Contact with and (suspected) exposure to covid-19: Secondary | ICD-10-CM | POA: Diagnosis not present

## 2021-06-07 DIAGNOSIS — Z20822 Contact with and (suspected) exposure to covid-19: Secondary | ICD-10-CM | POA: Diagnosis not present

## 2021-06-07 DIAGNOSIS — R051 Acute cough: Secondary | ICD-10-CM | POA: Diagnosis not present

## 2021-06-07 DIAGNOSIS — R059 Cough, unspecified: Secondary | ICD-10-CM | POA: Diagnosis not present

## 2021-06-24 DIAGNOSIS — E7800 Pure hypercholesterolemia, unspecified: Secondary | ICD-10-CM | POA: Diagnosis not present

## 2021-06-24 DIAGNOSIS — R7309 Other abnormal glucose: Secondary | ICD-10-CM | POA: Diagnosis not present

## 2021-06-24 DIAGNOSIS — K219 Gastro-esophageal reflux disease without esophagitis: Secondary | ICD-10-CM | POA: Diagnosis not present

## 2021-06-24 DIAGNOSIS — R259 Unspecified abnormal involuntary movements: Secondary | ICD-10-CM | POA: Diagnosis not present

## 2021-06-24 DIAGNOSIS — N400 Enlarged prostate without lower urinary tract symptoms: Secondary | ICD-10-CM | POA: Diagnosis not present

## 2021-06-24 DIAGNOSIS — F900 Attention-deficit hyperactivity disorder, predominantly inattentive type: Secondary | ICD-10-CM | POA: Diagnosis not present

## 2021-06-24 DIAGNOSIS — I2510 Atherosclerotic heart disease of native coronary artery without angina pectoris: Secondary | ICD-10-CM | POA: Diagnosis not present

## 2021-06-24 DIAGNOSIS — R350 Frequency of micturition: Secondary | ICD-10-CM | POA: Diagnosis not present

## 2021-06-24 DIAGNOSIS — E559 Vitamin D deficiency, unspecified: Secondary | ICD-10-CM | POA: Diagnosis not present

## 2021-06-24 DIAGNOSIS — M109 Gout, unspecified: Secondary | ICD-10-CM | POA: Diagnosis not present

## 2021-06-24 DIAGNOSIS — I10 Essential (primary) hypertension: Secondary | ICD-10-CM | POA: Diagnosis not present

## 2021-06-29 ENCOUNTER — Other Ambulatory Visit (HOSPITAL_COMMUNITY): Payer: Self-pay

## 2021-06-30 IMAGING — CR DG CHEST 2V
2 series · 2 of 2 positions shown · non-contrast
Comparison: 03/20/2017

CLINICAL DATA: Chest pain

EXAM:
CHEST - 2 VIEW

[chest pa]
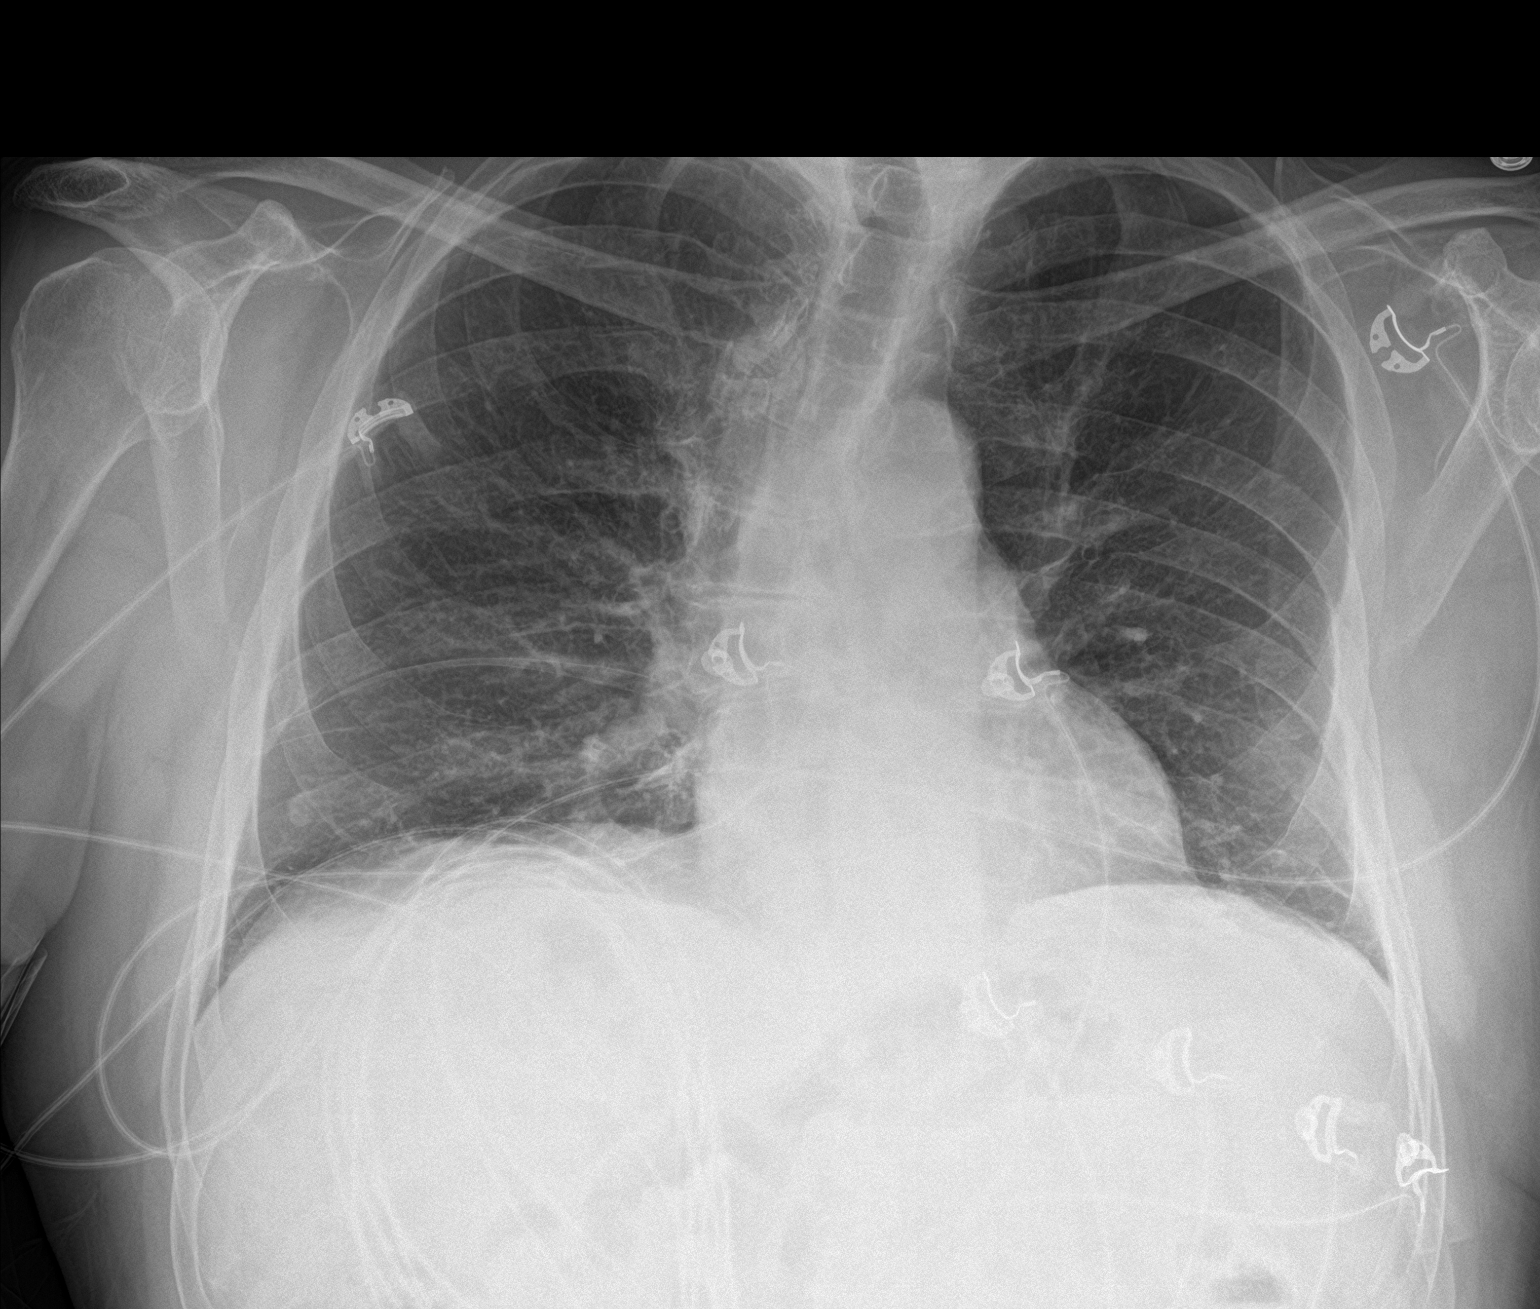

[chest lat]
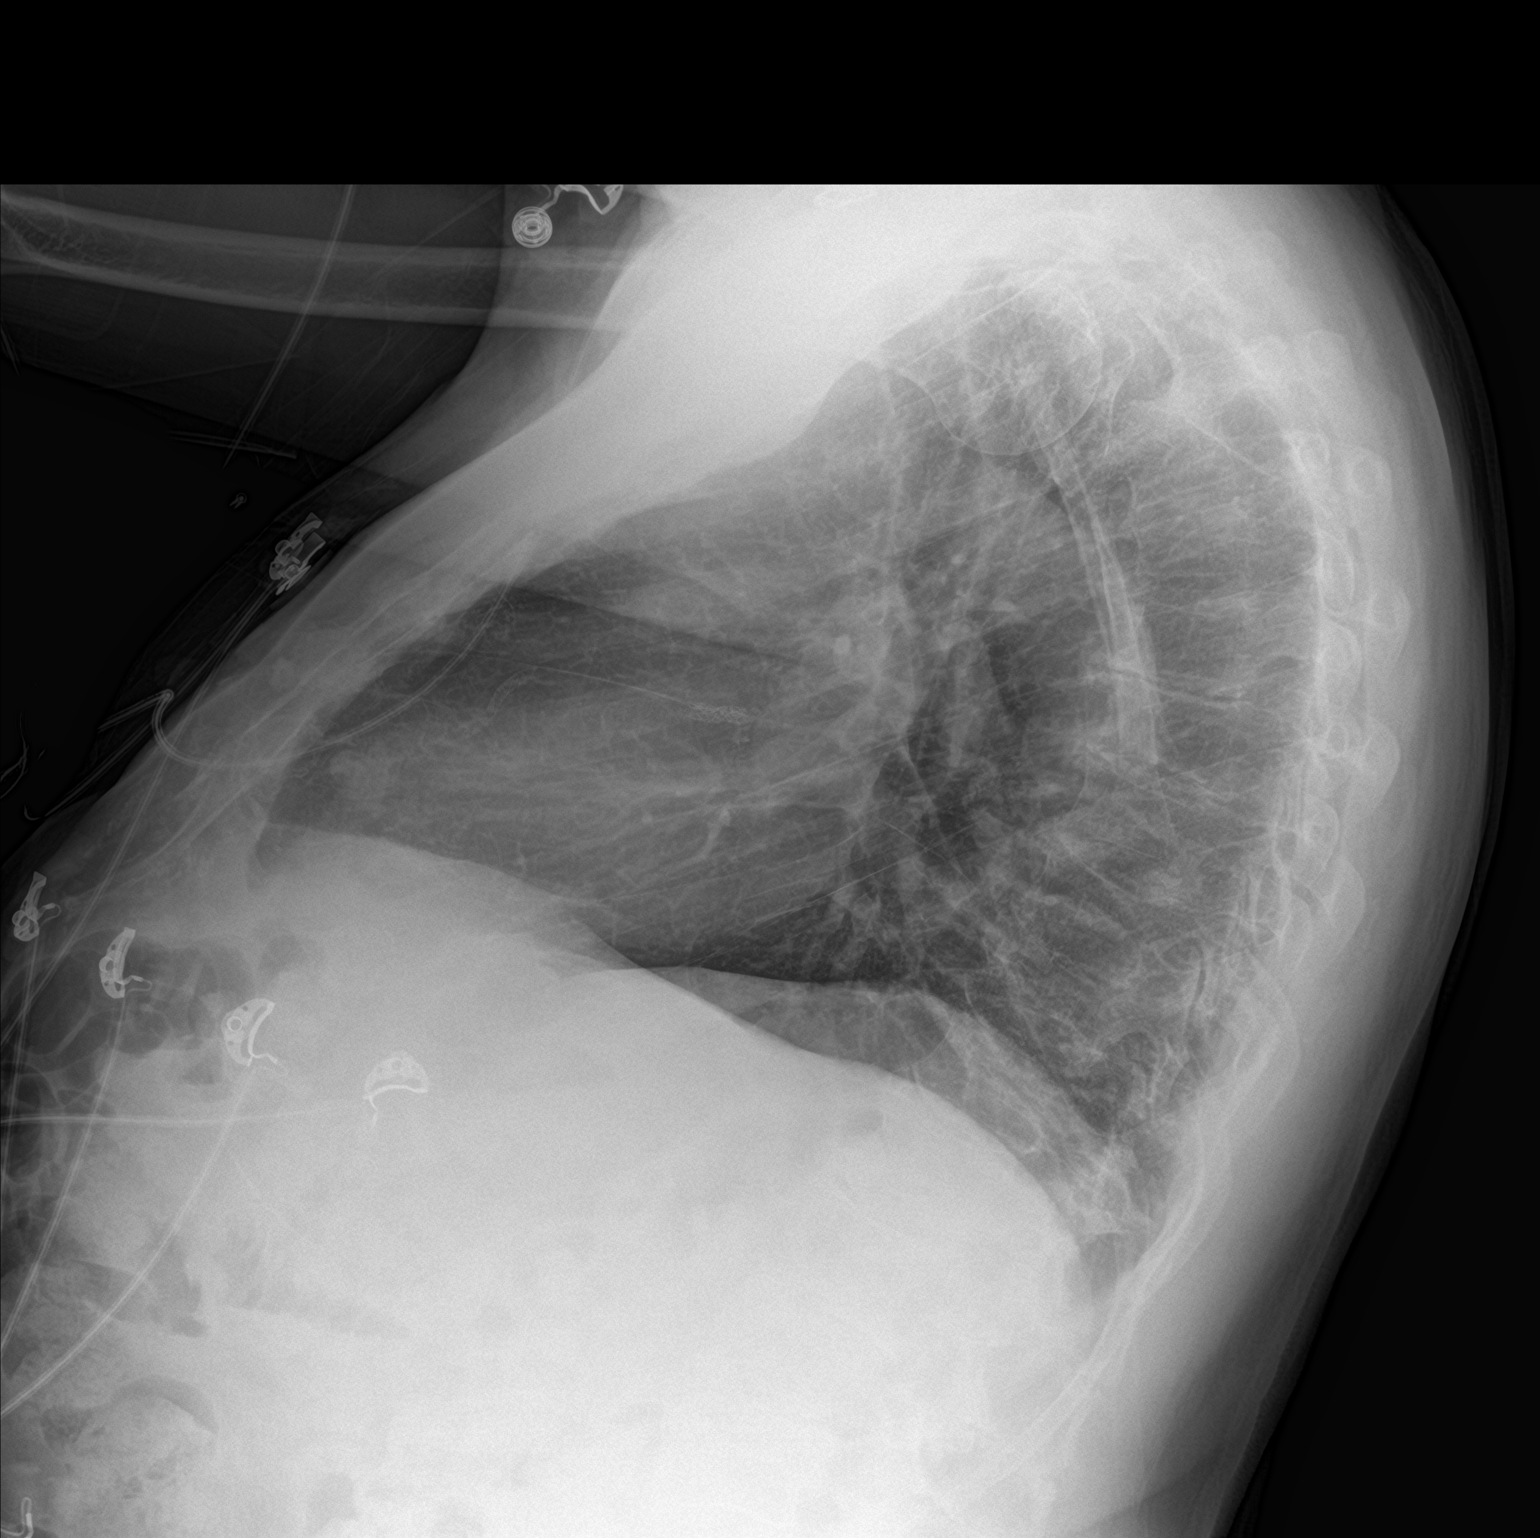

[2 of 2 positions shown; findings below may reference images not displayed]

FINDINGS: Low volume chest with interstitial crowding. There is no edema,
consolidation, effusion, or pneumothorax. Normal heart size and
mediastinal contours. A coronary stent is seen in the lateral view.
Nodular density over the right lung bases from nipple shadow based
on the lateral view. No pulmonary nodule in the lower lungs on CT
colonoscopy from last year. Scoliosis.
IMPRESSION: No acute finding when compared with prior.

## 2021-07-06 ENCOUNTER — Other Ambulatory Visit (HOSPITAL_COMMUNITY): Payer: Self-pay

## 2021-07-08 DIAGNOSIS — Z20822 Contact with and (suspected) exposure to covid-19: Secondary | ICD-10-CM | POA: Diagnosis not present

## 2021-08-03 ENCOUNTER — Other Ambulatory Visit (HOSPITAL_COMMUNITY): Payer: Self-pay

## 2021-08-04 ENCOUNTER — Other Ambulatory Visit (HOSPITAL_COMMUNITY): Payer: Self-pay

## 2021-08-04 MED ORDER — AMPHETAMINE-DEXTROAMPHETAMINE 5 MG PO TABS
ORAL_TABLET | ORAL | 0 refills | Status: DC
  Filled 2021-08-04: qty 60, 30d supply, fill #0

## 2021-08-20 DIAGNOSIS — S20212A Contusion of left front wall of thorax, initial encounter: Secondary | ICD-10-CM | POA: Diagnosis not present

## 2021-09-05 ENCOUNTER — Other Ambulatory Visit (HOSPITAL_COMMUNITY): Payer: Self-pay

## 2021-09-05 MED ORDER — AMPHETAMINE-DEXTROAMPHETAMINE 5 MG PO TABS
ORAL_TABLET | ORAL | 0 refills | Status: DC
  Filled 2021-09-05: qty 60, 30d supply, fill #0

## 2021-09-20 ENCOUNTER — Encounter: Payer: Self-pay | Admitting: Neurology

## 2021-09-20 ENCOUNTER — Ambulatory Visit (INDEPENDENT_AMBULATORY_CARE_PROVIDER_SITE_OTHER): Payer: Medicare Other | Admitting: Neurology

## 2021-09-20 VITALS — BP 139/76 | HR 60 | Ht 67.0 in | Wt 178.6 lb

## 2021-09-20 DIAGNOSIS — R2689 Other abnormalities of gait and mobility: Secondary | ICD-10-CM

## 2021-09-20 DIAGNOSIS — G250 Essential tremor: Secondary | ICD-10-CM | POA: Diagnosis not present

## 2021-09-20 DIAGNOSIS — Z9181 History of falling: Secondary | ICD-10-CM

## 2021-09-20 MED ORDER — PRIMIDONE 50 MG PO TABS: 75.0000 mg | ORAL_TABLET | Freq: Every day | ORAL | 3 refills | Status: DC

## 2021-09-20 NOTE — Progress Notes (Signed)
Subjective:    Patient ID: Cole Martinez is a 76 y.o. male.  HPI    Interim history:   Mr. Cole Martinez is a 76 year old right-handed gentleman with an underlying complex medical history of coronary artery disease with status post stenting, hypertension, hyperlipidemia, elevated PSA, urethral stricture, Crohn's disease, CLL, ADHD, vitamin B12 deficiency, osteoarthritis, neuropathy, and tremor, who presents for follow-up consultation of his essential tremor.  The patient is unaccompanied today. I first met him on 03/29/2021, at which time he felt fairly stable with regards to his tremor, he had noticed more balance issues.  He was taking Mysoline low-dose 50 mg at bedtime.  We talked about the possibility of increasing the Mysoline slightly to 75 mg at bedtime, he was advised to hydrate well with water and limit his caffeine to 1-2 servings per day and discuss with cardiology the possibility of increasing his metoprolol.  He did have mild bradycardia.  He was advised to start using a cane for gait safety.  Today, 09/20/2021: He reports that his tremor has become worse.  He has not increased his Mysoline and is still on the same dose of metoprolol low-dose.  He fell a couple weeks ago while [employer] in [place].  He was at a relatives house and fell in the bathroom, was carrying his phone and iPad in each hand and did not pay attention, fell backwards and bruised his rib cage, eventually he had an x-ray through orthopedics once he returned and there was no obvious fracture but he did have pain which has since then resolved thankfully.  He tries to hydrate well, limits his caffeine, he does drink 1 alcoholic beverage in the evening.  His balance is not as good.  He may start using a cane but is not ready to use a walker.  His primary care encouraged him to start using a walker.  He has had physical therapy.   The patient's allergies, current medications, family history, past medical history, past social  history, past surgical history and problem list were reviewed and updated as appropriate.    Previously:  03/29/21 (SA): He previously followed with Dr. Margette Fast and was last seen by him on 10/25/2020.  I reviewed the note and copied the note below for reference. The patient reported problems with his memory at the time.  His examination was benign in that regard.  He was advised to continue with Mysoline 50 mg at bedtime and the possibility of pursuing a DaTscan was discussed.   The patient had a brain MRI without contrast on 09/24/2020 and I reviewed the results:    IMPRESSION: This MRI of the brain without contrast shows the following:  1.   Generalized cortical atrophy, stable compared to the CT scan from 03/20/2017. 2.    Some T2/flair hyperintense foci in the hemispheres consistent with mild chronic microvascular ischemic change. 3.    No acute findings.   He had a cervical spine MRI without contrast on 09/24/2020 and I reviewed the results: IMPRESSION: 1. Multilevel spondylosis of the cervical spine as described. 2. Mild bilateral foraminal narrowing at C2-3. 3. Severe foraminal narrowing bilaterally at C3-4. 4. Moderate foraminal narrowing bilaterally at C4-5 and C5-6. 5. Moderate right and mild left foraminal narrowing at C6-7. 6. Central canal stenosis is greatest at C4-5 and C5-6 with effacement of the ventral CSF but no abnormal cord signal.   Lumbar spine MRI without contrast on 09/02/2020 and I reviewed the results: IMPRESSION: 1. Transitional lumbosacral anatomy  with partially lumbarized S1 and a single right-sided rib at L1, consistent with prior MRI numbering. Correlation with radiographs is recommended prior to any operative intervention. 2. Severe left foraminal stenosis at L5-S1. Moderate foraminal stenosis on the right at L1-L2 and L2-L3 and the left at L4-L5. Mild-to-moderate bilateral foraminal stenosis at L3-L4. 3. Moderate canal stenosis at L4-L5 and mild  canal stenosis at L3-L4 with mild-to-moderate left subarticular recess stenosis at both levels. Moderate left subarticular recess stenosis at L5-S1. 4. Dextrocurvature centered at L2.     10/25/2020 (Cole Martinez): <<Cole Martinez is a 76 year old right-handed white male with a history of essential tremor.  The patient has both resting and action components of the tremor that has been present for a number years.  He reports a gradually worsening gait problem, he has not had any recent falls.  He did have physical therapy almost a year ago which was helpful.  The patient reports that the resting component of the tremor is worse on the left than the right.  He denies any tremor affecting the head or neck or speech.  He reports that his brother was given a diagnosis of Parkinson's disease.  He also reports some troubles with memory that has been present for about 6 months.  He recently had MRI of the brain that did show some generalized atrophy.  He reports trouble with handwriting and with using a mouse.  He can do better using the mouse with the left hand than the right.  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--  ONCOLOGIST-  DR Benay Spice   PT IS ASYMPTOMATIC--- LAST CBC W/ DIFF 06-24-2012 STABLE   Coronary artery disease CARDIOLOGIST- DR Daneen Schick   S/P STENTING LAD 1999 // Myoview 01/2019: EF 62, normal perfusion; Low Risk   Crohn's disease of ileum ([place]) [place]   ED (erectile dysfunction)    Elevated PSA    Essential and other specified forms of tremor 11/25/2012   Gait abnormality 05/17/2020   H/O adenomatous polyp of colon    Hyperlipidemia    Hypertension    Nocturia    OA (osteoarthritis)    Peripheral  neuropathy    hx of, none current as of 08-04-13   Peyronie disease    S/P coronary artery stent placement OCT 1999 OF LAD   Tremor, hereditary, benign MILD RIGHT HAND    His Past Surgical History Is Significant For: Past Surgical History:  Procedure Laterality Date   CARDIOVASCULAR STRESS TEST  11-01-2010 DR Daneen Schick   NORMAL PERFUSION STUDY/ EF 64%/ NO ISCHEMIA   cataract surgery  Bilateral feburary 2020   with lens placement    COLONOSCOPY WITH PROPOFOL N/A 09/24/2012   Procedure: COLONOSCOPY WITH PROPOFOL;  Surgeon: [name], MD;  Location: [facility];  Service: Endoscopy;  Laterality: N/A;   CORONARY ANGIOPLASTY WITH STENT PLACEMENT  OCT 1999   STENT OF LAD   CORONARY STENT INTERVENTION N/A 02/14/2019   Procedure: CORONARY STENT INTERVENTION;  Surgeon: Belva Crome, MD;  Location: [facility];  Service: Cardiovascular;  Laterality: N/A;   CYSTOSCOPY WITH URETHRAL DILATATION  03/12/2017   Procedure: CYSTOSCOPY WITH URETHRAL DILATATION;  Surgeon: Gaynelle Arabian, MD;  Location: [facility];  Service: Orthopedics;;  Ammie Dalton, Resident Assisting   CYSTOSCOPY WITH URETHRAL DILATATION N/A 10/03/2018   Procedure: CYSTOSCOPY WITH URETHRAL BALLOON DILATATION WITH BILATERAL RETROGRADE PYELOGRAPHY;  Surgeon: Raynelle Bring, MD;  Location: [facility];  Service: Urology;  Laterality: N/A;   CYSTOSCOPY WITH URETHRAL DILATATION N/A 12/30/2020   Procedure: CYSTOSCOPY WITH BALLOON DILATATION OF URETHRAL STRICTURE;  Surgeon: Raynelle Bring, MD;  Location: [facility];  Service: Urology;  Laterality: N/A;   LAPAROSCOPIC INGUINAL HERNIA REPAIR Bilateral 01-10-2004   W/ MESH   LEFT HEART CATH AND CORONARY ANGIOGRAPHY N/A 02/13/2019   Procedure: LEFT HEART CATH AND CORONARY ANGIOGRAPHY;  Surgeon: Belva Crome, MD;  Location: [facility];  Service: Cardiovascular;  Laterality: N/A;   neck benign removed from neck  yrs ago   PROSTATE BIOPSY N/A 07/12/2012   Procedure: PROSTATE BIOPSY AND  ULTRASOUND;  Surgeon: Ailene Rud, MD;  Location: [facility];  Service: Urology;  Laterality: N/A;   PROSTATE SURGERY  2002   tuna   REMOVAL LEFT NECK LYMPH NODE  1996   TONSILLECTOMY  CHILD   TOTAL KNEE ARTHROPLASTY Right 03/12/2017   Procedure: RIGHT TOTAL KNEE ARTHROPLASTY;  Surgeon: Gaynelle Arabian, MD;  Location: [facility];  Service: Orthopedics;  Laterality: Right;  Adductor Block   TRANSURETHRAL RESECTION OF PROSTATE N/A 08/08/2013   Procedure: TRANSURETHRAL RESECTION OF THE PROSTATE WITH GYRUS INSTRUMENTS;  Surgeon: Ailene Rud, MD;  Location: [facility];  Service: Urology;  Laterality: N/A;   TRANSURETHRAL RESECTION OF PROSTATE      His Family History Is Significant For: Family History  Problem Relation Age of Onset   Obesity Brother    Diabetes Brother    Parkinsonism Brother     His Social History Is Significant For: Social History   Socioeconomic History   Marital status: Married    Spouse name: Cole Martinez   Number of children: 3   Years of education: 16   Highest education level: Bachelor's degree (e.g., BA, AB, BS)  Occupational History   Occupation: part time    Comment: self employed Engineer, maintenance (IT)  Tobacco Use   Smoking status: Former    Packs/day: 1.00    Years: 10.00    Total pack years: 10.00    Types: Cigarettes    Quit date: 07/09/1984    Years since quitting: 37.2   Smokeless tobacco: Never  Vaping Use   Vaping Use: Never used  Substance and Sexual Activity   Alcohol use: Not Currently    Alcohol/week: 7.0 standard drinks of alcohol    Types: 7 Shots of liquor per week    Comment: daily 1 per day ,    Drug use: No   Sexual activity: Not on file  Other Topics Concern   Not on file  Social History Narrative   Lives at home with is wife, Cole Martinez.     Right handed   Drinks 2-3 cups coffee daily   Social Determinants of Health   Financial Resource Strain: Not on file  Food Insecurity: No Food Insecurity (03/20/2019)   Hunger Vital Sign     Worried About Running Out of Food in the Last Year: Never true    Ran Out of Food in the Last Year: Never true  Transportation Needs: No Transportation Needs (03/20/2019)   PRAPARE - Hydrologist (Medical): No    Lack of Transportation (Non-Medical): No  Physical Activity: Insufficiently Active (03/20/2019)   Exercise  Vital Sign    Days of Exercise per Week: 3 days    Minutes of Exercise per Session: 40 min  Stress: Not on file  Social Connections: Unknown (03/20/2019)   Social Connection and Isolation Panel [NHANES]    Frequency of Communication with Friends and Family: More than three times a week    Frequency of Social Gatherings with Friends and Family: Once a week    Attends Religious Services: Not on Advertising copywriter or Organizations: Not on file    Attends Archivist Meetings: Not on file    Marital Status: Married    His Allergies Are:  No Known Allergies:   His Current Medications Are:  Outpatient Encounter Medications as of 09/20/2021  Medication Sig   acetaminophen (TYLENOL) 500 MG tablet Take 1,000 mg by mouth at bedtime as needed for mild pain (joint pain).    amphetamine-dextroamphetamine (ADDERALL) 5 MG tablet Take 1 tablet by mouth twice a day. (Patient taking differently: daily. Pt takes 1 tablet in am)   aspirin EC 81 MG tablet Take 1 tablet (81 mg total) by mouth daily. Swallow whole.   atorvastatin (LIPITOR) 20 MG tablet TAKE 1 TABLET BY MOUTH IN  THE MORNING   calcium citrate (CALCITRATE - DOSED IN MG ELEMENTAL CALCIUM) 950 (200 Ca) MG tablet Take 200 mg of elemental calcium by mouth daily.   Cholecalciferol 25 MCG (1000 UT) tablet Take 1,000 Units by mouth daily.   Clobetasol Prop Emollient Base (CLOBETASOL PROPIONATE E) 0.05 % emollient cream Apply 1 application topically 2 (two) times daily.   Coenzyme Q10 (CO Q-10) 200 MG CAPS Take 200 mg by mouth daily.    Cyanocobalamin (VITAMIN B-12) 2500 MCG SUBL Place  2,500 mcg under the tongue daily.   diclofenac Sodium (VOLTAREN) 1 % GEL Apply 1 application topically 4 (four) times daily as needed (pain).   famotidine (PEPCID) 20 MG tablet Take 20 mg by mouth every morning.   metoprolol succinate (TOPROL-XL) 25 MG 24 hr tablet TAKE 1 TABLET BY MOUTH ONCE DAILY   pantoprazole (PROTONIX) 40 MG tablet TAKE 1 TABLET BY MOUTH  DAILY   [DISCONTINUED] primidone (MYSOLINE) 50 MG tablet Take 1 tablet (50 mg total) by mouth at bedtime.   amphetamine-dextroamphetamine (ADDERALL) 5 MG tablet Take 1 tablet by mouth twice daily.   primidone (MYSOLINE) 50 MG tablet Take 1.5 tablets (75 mg total) by mouth at bedtime.   No facility-administered encounter medications on file as of 09/20/2021.  :  Review of Systems:  Out of a complete 14 point review of systems, all are reviewed and negative with the exception of these symptoms as listed below:   Review of Systems  Neurological:        Pt here for essential  tremors follow up   Pt states tremors are worse Pt states tremors are in both hands and some facial tremors Pt states tremors is affecting his gait Pt states he fell 2 weeks ago . No ED visit  required   Pt states that tremors increase when he gets upset.     Objective:  Neurological Exam  Physical Exam Physical Examination:   Vitals:   09/20/21 0851  BP: 139/76  Pulse: 60   General Examination: The patient is a very pleasant 76 y.o. male in no acute distress. He appears well-developed and well-nourished and well groomed.   HEENT: Normocephalic, atraumatic, pupils are equal, round and reactive to light.  Extraocular tracking is  well-preserved, no nystagmus, face is symmetric, no obvious facial masking.  Corrective eyeglasses.  Speech is clear without voice tremor, no hypophonia or dysarthria.  No lip, neck or jaw tremor, no carotid bruits, shorter neck noted. Unequal shoulder height, right shoulder higher than left. Oropharynx exam reveals: mild mouth dryness,  moderate airway crowding.  Tongue protrudes centrally and palate elevates symmetrically.    Chest: Clear to auscultation without wheezing, rhonchi or crackles noted.   Heart: S1+S2+0, regular and normal without murmurs, rubs or gallops noted.    Abdomen: Soft, non-tender and non-distended.   Extremities: There is no pitting edema in the distal lower extremities bilaterally.    Skin: Warm and dry without trophic changes noted.    Musculoskeletal: exam reveals arthritic changes in both hands.      Neurologically:  Mental status: The patient is awake, alert and oriented in all 4 spheres. His immediate and remote memory, attention, language skills and fund of knowledge are appropriate. There is no evidence of aphasia, agnosia, apraxia or anomia. Speech is clear with normal prosody and enunciation. Thought process is linear. Mood is normal and affect is normal.  Cranial nerves II - XII are as described above under HEENT exam.  Motor exam: Normal bulk, strength and tone is noted. There is an intermittent mild resting tremor in both upper extremities.  He has a mild postural and action tremor, no obvious intention tremor.  No lower extremity tremor.  Romberg is not tested for safety concerns, fine motor skills are mildly impaired bilaterally, no lateralization.   Cerebellar testing: No dysmetria or intention tremor. There is no truncal or gait ataxia.  Finger-to-nose without obvious dysmetria, heel-to-shin doable. Sensory exam: intact to light touch in the upper and lower extremities.  Gait, station and balance: He stands slowly, mild difficulty noted, posture is stooped for age, scoliosis and upper body increased curvature noted, walks without a walking aid, slowly, cautiously, mild balance impairment noted.  Preserved arm swing.     Assessment and Plan:  In summary, Cole Martinez is a very pleasant 76 year old right-handed gentleman with an underlying complex medical history of coronary artery  disease with status post stenting, hypertension, hyperlipidemia, elevated PSA, urethral stricture, Crohn's disease, CLL, ADHD, vitamin B12 deficiency, osteoarthritis, neuropathy, and tremor, who presents for follow-up consultation of his essential tremor.  He has been on low-dose Mysoline.  He has experienced progression over time.  He has a family history of tremors and reports that his brother was diagnosed with Parkinson's disease later in life.  The patient does have a postural, action and resting component.  No obvious parkinsonian features.  I suggested a cautious increase in Mysoline to 75 mg at bedtime.  Unfortunately, Mysoline can cause balance issues and sleepiness, he is advised to be mindful of this.  He is agreeable to trying to increase the Mysoline at least for a few days to see if it helps.  He continues to take low-dose metoprolol per cardiology.  He is again advised to limit his caffeine intake, stay well-hydrated with water and limit his alcohol intake to less than 1 serving per day if possible.  We talked about the importance of fall prevention. He is encouraged to use a cane for gait safety but he continues to be reluctant to consider a cane. He is advised to follow-up in this clinic routinely in about 6 months to see 1 of our nurse practitioners, sooner if needed.  I answered all his questions today and he was  in agreement.  I spent 30 minutes in total face-to-face time and in reviewing records during pre-charting, more than 50% of which was spent in counseling and coordination of care, reviewing test results, reviewing medications and treatment regimen and/or in discussing or reviewing the diagnosis of ET, the prognosis and treatment options. Pertinent laboratory and imaging test results that were available during this visit with the patient were reviewed by me and considered in my medical decision making (see chart for details).

## 2021-09-27 ENCOUNTER — Ambulatory Visit: Payer: Medicare Other | Admitting: Neurology

## 2021-10-03 ENCOUNTER — Other Ambulatory Visit (HOSPITAL_COMMUNITY): Payer: Self-pay

## 2021-10-04 ENCOUNTER — Other Ambulatory Visit (HOSPITAL_COMMUNITY): Payer: Self-pay

## 2021-10-04 MED ORDER — AMPHETAMINE-DEXTROAMPHETAMINE 5 MG PO TABS
1.0000 | ORAL_TABLET | Freq: Two times a day (BID) | ORAL | 0 refills | Status: DC
  Filled 2021-10-04: qty 60, 30d supply, fill #0

## 2021-10-27 ENCOUNTER — Other Ambulatory Visit (HOSPITAL_COMMUNITY): Payer: Self-pay

## 2021-10-27 MED ORDER — AMPHETAMINE-DEXTROAMPHETAMINE 5 MG PO TABS
1.0000 | ORAL_TABLET | Freq: Two times a day (BID) | ORAL | 0 refills | Status: DC
  Filled 2021-10-27: qty 60, 30d supply, fill #0

## 2021-10-28 ENCOUNTER — Other Ambulatory Visit (HOSPITAL_COMMUNITY): Payer: Self-pay

## 2021-11-02 DIAGNOSIS — R21 Rash and other nonspecific skin eruption: Secondary | ICD-10-CM | POA: Diagnosis not present

## 2021-11-09 DIAGNOSIS — K5669 Other partial intestinal obstruction: Secondary | ICD-10-CM | POA: Diagnosis not present

## 2021-11-09 DIAGNOSIS — Z9889 Other specified postprocedural states: Secondary | ICD-10-CM | POA: Diagnosis not present

## 2021-11-09 DIAGNOSIS — E785 Hyperlipidemia, unspecified: Secondary | ICD-10-CM | POA: Diagnosis not present

## 2021-11-09 DIAGNOSIS — Z86010 Personal history of colonic polyps: Secondary | ICD-10-CM | POA: Diagnosis not present

## 2021-11-09 DIAGNOSIS — K5730 Diverticulosis of large intestine without perforation or abscess without bleeding: Secondary | ICD-10-CM | POA: Diagnosis not present

## 2021-11-09 DIAGNOSIS — D126 Benign neoplasm of colon, unspecified: Secondary | ICD-10-CM | POA: Diagnosis not present

## 2021-11-09 DIAGNOSIS — I10 Essential (primary) hypertension: Secondary | ICD-10-CM | POA: Diagnosis not present

## 2021-11-09 DIAGNOSIS — K6389 Other specified diseases of intestine: Secondary | ICD-10-CM | POA: Diagnosis not present

## 2021-11-09 DIAGNOSIS — D122 Benign neoplasm of ascending colon: Secondary | ICD-10-CM | POA: Diagnosis not present

## 2021-11-09 DIAGNOSIS — D123 Benign neoplasm of transverse colon: Secondary | ICD-10-CM | POA: Diagnosis not present

## 2021-11-09 DIAGNOSIS — I2510 Atherosclerotic heart disease of native coronary artery without angina pectoris: Secondary | ICD-10-CM | POA: Diagnosis not present

## 2021-11-09 DIAGNOSIS — K529 Noninfective gastroenteritis and colitis, unspecified: Secondary | ICD-10-CM | POA: Diagnosis not present

## 2021-11-09 DIAGNOSIS — Q439 Congenital malformation of intestine, unspecified: Secondary | ICD-10-CM | POA: Diagnosis not present

## 2021-11-09 DIAGNOSIS — K635 Polyp of colon: Secondary | ICD-10-CM | POA: Diagnosis not present

## 2021-11-09 DIAGNOSIS — Z1211 Encounter for screening for malignant neoplasm of colon: Secondary | ICD-10-CM | POA: Diagnosis not present

## 2021-11-23 DIAGNOSIS — M8580 Other specified disorders of bone density and structure, unspecified site: Secondary | ICD-10-CM | POA: Diagnosis not present

## 2021-11-23 DIAGNOSIS — R6882 Decreased libido: Secondary | ICD-10-CM | POA: Diagnosis not present

## 2021-11-23 DIAGNOSIS — Z23 Encounter for immunization: Secondary | ICD-10-CM | POA: Diagnosis not present

## 2021-11-23 DIAGNOSIS — R7309 Other abnormal glucose: Secondary | ICD-10-CM | POA: Diagnosis not present

## 2021-11-23 DIAGNOSIS — E230 Hypopituitarism: Secondary | ICD-10-CM | POA: Diagnosis not present

## 2021-11-24 ENCOUNTER — Other Ambulatory Visit: Payer: Self-pay | Admitting: Internal Medicine

## 2021-11-24 DIAGNOSIS — M8580 Other specified disorders of bone density and structure, unspecified site: Secondary | ICD-10-CM

## 2021-11-29 DIAGNOSIS — Z23 Encounter for immunization: Secondary | ICD-10-CM | POA: Diagnosis not present

## 2021-12-13 ENCOUNTER — Inpatient Hospital Stay (HOSPITAL_BASED_OUTPATIENT_CLINIC_OR_DEPARTMENT_OTHER): Payer: Medicare Other | Admitting: Oncology

## 2021-12-13 ENCOUNTER — Inpatient Hospital Stay: Payer: Medicare Other | Attending: Oncology

## 2021-12-13 ENCOUNTER — Other Ambulatory Visit (HOSPITAL_BASED_OUTPATIENT_CLINIC_OR_DEPARTMENT_OTHER): Payer: Self-pay

## 2021-12-13 VITALS — BP 128/71 | HR 61 | Temp 98.1°F | Resp 18 | Ht 66.0 in | Wt 174.0 lb

## 2021-12-13 DIAGNOSIS — C9110 Chronic lymphocytic leukemia of B-cell type not having achieved remission: Secondary | ICD-10-CM | POA: Insufficient documentation

## 2021-12-13 DIAGNOSIS — Z23 Encounter for immunization: Secondary | ICD-10-CM | POA: Diagnosis not present

## 2021-12-13 LAB — CMP (CANCER CENTER ONLY)
ALT: 15 U/L (ref 0–44)
AST: 20 U/L (ref 15–41)
Albumin: 4.3 g/dL (ref 3.5–5.0)
Alkaline Phosphatase: 64 U/L (ref 38–126)
Anion gap: 7 (ref 5–15)
BUN: 22 mg/dL (ref 8–23)
CO2: 29 mmol/L (ref 22–32)
Calcium: 9.2 mg/dL (ref 8.9–10.3)
Chloride: 107 mmol/L (ref 98–111)
Creatinine: 1.17 mg/dL (ref 0.61–1.24)
GFR, Estimated: >60 mL/min (ref >60)
Glucose, Bld: 104 mg/dL — ABNORMAL HIGH (ref 70–99)
Potassium: 4.4 mmol/L (ref 3.5–5.1)
Sodium: 143 mmol/L (ref 135–145)
Total Bilirubin: 0.7 mg/dL (ref 0.3–1.2)
Total Protein: 6.7 g/dL (ref 6.5–8.1)

## 2021-12-13 LAB — CBC WITH DIFFERENTIAL (CANCER CENTER ONLY)
Abs Immature Granulocytes: 0.03 10*3/uL (ref 0.00–0.07)
Basophils Absolute: 0.1 10*3/uL (ref 0.0–0.1)
Basophils Relative: 1 %
Eosinophils Absolute: 0.3 10*3/uL (ref 0.0–0.5)
Eosinophils Relative: 4 %
HCT: 46.1 % (ref 39.0–52.0)
Hemoglobin: 15.1 g/dL (ref 13.0–17.0)
Immature Granulocytes: 0 %
Lymphocytes Relative: 19 %
Lymphs Abs: 1.6 10*3/uL (ref 0.7–4.0)
MCH: 30.4 pg (ref 26.0–34.0)
MCHC: 32.8 g/dL (ref 30.0–36.0)
MCV: 92.8 fL (ref 80.0–100.0)
Monocytes Absolute: 0.8 10*3/uL (ref 0.1–1.0)
Monocytes Relative: 10 %
Neutro Abs: 5.7 10*3/uL (ref 1.7–7.7)
Neutrophils Relative %: 66 %
Platelet Count: 238 10*3/uL (ref 150–400)
RBC: 4.97 MIL/uL (ref 4.22–5.81)
RDW: 14.4 % (ref 11.5–15.5)
WBC Count: 8.6 10*3/uL (ref 4.0–10.5)
nRBC: 0 % (ref 0.0–0.2)

## 2021-12-13 MED ORDER — PREVNAR 20 0.5 ML IM SUSY
PREFILLED_SYRINGE | INTRAMUSCULAR | 0 refills | Status: DC
  Filled 2021-12-13: qty 0.5, 1d supply, fill #0

## 2021-12-13 NOTE — Progress Notes (Signed)
  [facility] OFFICE PROGRESS NOTE   Diagnosis: CLL  INTERVAL HISTORY:   Mr. Dollinger returns as scheduled.  No fever or palpable lymph nodes.  He has an occasional night sweat.  He had a several episodes of hematuria while in [place] last Sunday.  The hematuria has resolved.  Objective:  Vital signs in last 24 hours:  Blood pressure 128/71, pulse 61, temperature 98.1 F (36.7 C), temperature source Oral, resp. rate 18, height 5' 6"  (4.696 m), weight 174 lb (78.9 kg), SpO2 100 %.    Lymphatics: No cervical, supraclavicular, axillary, or inguinal nodes Resp: Lungs with good air movement bilaterally, end inspiratory fine rales at the lower posterior lateral chest bilaterally, no respiratory distress Cardio: Regular rate and rhythm GI: No hepatosplenomegaly Vascular: No leg edema   Lab Results:  Lab Results  Component Value Date   WBC 8.6 12/13/2021   HGB 15.1 12/13/2021   HCT 46.1 12/13/2021   MCV 92.8 12/13/2021   PLT 238 12/13/2021   NEUTROABS 5.7 12/13/2021    CMP  Lab Results  Component Value Date   NA 143 12/13/2021   K 4.4 12/13/2021   CL 107 12/13/2021   CO2 29 12/13/2021   GLUCOSE 104 (H) 12/13/2021   BUN 22 12/13/2021   CREATININE 1.17 12/13/2021   CALCIUM 9.2 12/13/2021   PROT 6.7 12/13/2021   ALBUMIN 4.3 12/13/2021   AST 20 12/13/2021   ALT 15 12/13/2021   ALKPHOS 64 12/13/2021   BILITOT 0.7 12/13/2021   GFRNONAA >60 12/13/2021   GFRAA >60 04/29/2019    Medications: I have reviewed the patient's current medications.   Assessment/Plan: Chronic lymphocytic leukemia, diagnosed in 1996. He remains asymptomatic and stable from a hematologic standpoint. 2. Pneumococcal 23 vaccine given on 11/30/2015 , 13 valent pneumonia vaccine given 11/13/2013      Disposition: Mr. Golubski is stable from a hematologic standpoint.  There is no evidence for progression of CLL.  He has received the current COVID-19 and influenza vaccines.  He received  the RSV vaccine.  He will obtain a pneumococcal 20 vaccine.  Mr. Edelman would like to continue follow-up at the cancer center.  He will return for an office visit in 1 year.  He is scheduled to see urology for evaluation of the hematuria.  Betsy Coder, MD  12/13/2021  10:47 AM

## 2021-12-16 DIAGNOSIS — R310 Gross hematuria: Secondary | ICD-10-CM | POA: Diagnosis not present

## 2021-12-16 DIAGNOSIS — Z87442 Personal history of urinary calculi: Secondary | ICD-10-CM | POA: Diagnosis not present

## 2021-12-20 ENCOUNTER — Other Ambulatory Visit (HOSPITAL_COMMUNITY): Payer: Self-pay

## 2021-12-20 MED ORDER — AMPHETAMINE-DEXTROAMPHETAMINE 5 MG PO TABS
5.0000 mg | ORAL_TABLET | Freq: Two times a day (BID) | ORAL | 0 refills | Status: AC
  Filled 2021-12-20: qty 60, 30d supply, fill #0

## 2021-12-22 DIAGNOSIS — Z1331 Encounter for screening for depression: Secondary | ICD-10-CM | POA: Diagnosis not present

## 2021-12-22 DIAGNOSIS — Z86010 Personal history of colonic polyps: Secondary | ICD-10-CM | POA: Diagnosis not present

## 2021-12-22 DIAGNOSIS — M8580 Other specified disorders of bone density and structure, unspecified site: Secondary | ICD-10-CM | POA: Diagnosis not present

## 2021-12-22 DIAGNOSIS — E669 Obesity, unspecified: Secondary | ICD-10-CM | POA: Diagnosis not present

## 2021-12-22 DIAGNOSIS — K909 Intestinal malabsorption, unspecified: Secondary | ICD-10-CM | POA: Diagnosis not present

## 2021-12-22 DIAGNOSIS — F900 Attention-deficit hyperactivity disorder, predominantly inattentive type: Secondary | ICD-10-CM | POA: Diagnosis not present

## 2021-12-22 DIAGNOSIS — R7303 Prediabetes: Secondary | ICD-10-CM | POA: Diagnosis not present

## 2021-12-22 DIAGNOSIS — I2510 Atherosclerotic heart disease of native coronary artery without angina pectoris: Secondary | ICD-10-CM | POA: Diagnosis not present

## 2021-12-22 DIAGNOSIS — Z0000 Encounter for general adult medical examination without abnormal findings: Secondary | ICD-10-CM | POA: Diagnosis not present

## 2021-12-22 DIAGNOSIS — E7800 Pure hypercholesterolemia, unspecified: Secondary | ICD-10-CM | POA: Diagnosis not present

## 2021-12-22 DIAGNOSIS — K219 Gastro-esophageal reflux disease without esophagitis: Secondary | ICD-10-CM | POA: Diagnosis not present

## 2021-12-22 DIAGNOSIS — C9110 Chronic lymphocytic leukemia of B-cell type not having achieved remission: Secondary | ICD-10-CM | POA: Diagnosis not present

## 2021-12-22 DIAGNOSIS — Z1283 Encounter for screening for malignant neoplasm of skin: Secondary | ICD-10-CM | POA: Diagnosis not present

## 2021-12-22 DIAGNOSIS — E230 Hypopituitarism: Secondary | ICD-10-CM | POA: Diagnosis not present

## 2021-12-23 DIAGNOSIS — R7303 Prediabetes: Secondary | ICD-10-CM | POA: Diagnosis not present

## 2021-12-30 DIAGNOSIS — R310 Gross hematuria: Secondary | ICD-10-CM | POA: Diagnosis not present

## 2021-12-30 DIAGNOSIS — K8020 Calculus of gallbladder without cholecystitis without obstruction: Secondary | ICD-10-CM | POA: Diagnosis not present

## 2021-12-30 DIAGNOSIS — R319 Hematuria, unspecified: Secondary | ICD-10-CM | POA: Diagnosis not present

## 2021-12-30 DIAGNOSIS — K5730 Diverticulosis of large intestine without perforation or abscess without bleeding: Secondary | ICD-10-CM | POA: Diagnosis not present

## 2022-01-02 ENCOUNTER — Other Ambulatory Visit (HOSPITAL_BASED_OUTPATIENT_CLINIC_OR_DEPARTMENT_OTHER): Payer: Self-pay

## 2022-01-18 DIAGNOSIS — L821 Other seborrheic keratosis: Secondary | ICD-10-CM | POA: Diagnosis not present

## 2022-01-18 DIAGNOSIS — L578 Other skin changes due to chronic exposure to nonionizing radiation: Secondary | ICD-10-CM | POA: Diagnosis not present

## 2022-01-18 DIAGNOSIS — L570 Actinic keratosis: Secondary | ICD-10-CM | POA: Diagnosis not present

## 2022-01-18 DIAGNOSIS — L308 Other specified dermatitis: Secondary | ICD-10-CM | POA: Diagnosis not present

## 2022-02-01 DIAGNOSIS — R310 Gross hematuria: Secondary | ICD-10-CM | POA: Diagnosis not present

## 2022-02-01 DIAGNOSIS — N401 Enlarged prostate with lower urinary tract symptoms: Secondary | ICD-10-CM | POA: Diagnosis not present

## 2022-02-01 DIAGNOSIS — R3912 Poor urinary stream: Secondary | ICD-10-CM | POA: Diagnosis not present

## 2022-02-07 ENCOUNTER — Other Ambulatory Visit (HOSPITAL_COMMUNITY): Payer: Self-pay

## 2022-02-18 ENCOUNTER — Other Ambulatory Visit (HOSPITAL_COMMUNITY): Payer: Self-pay

## 2022-02-23 DIAGNOSIS — H6123 Impacted cerumen, bilateral: Secondary | ICD-10-CM | POA: Diagnosis not present

## 2022-03-07 DIAGNOSIS — H524 Presbyopia: Secondary | ICD-10-CM | POA: Diagnosis not present

## 2022-03-07 DIAGNOSIS — Z961 Presence of intraocular lens: Secondary | ICD-10-CM | POA: Diagnosis not present

## 2022-03-07 DIAGNOSIS — H52203 Unspecified astigmatism, bilateral: Secondary | ICD-10-CM | POA: Diagnosis not present

## 2022-03-13 NOTE — Progress Notes (Unsigned)
Cardiology Office Note:    Date:  03/14/2022   ID:  Cole Martinez, DOB [birth year], MRN [id number]  PCP:  Josetta Huddle, MD   [facility] Cardiologist:  Lenna Sciara, MD Referring MD: Josetta Huddle, MD   Chief Complaint/Reason for Referral: General cardiology follow-up  ASSESSMENT:    1. Coronary artery disease involving native coronary artery of native heart with other form of angina pectoris ([place])   2. Hyperlipidemia LDL goal <70   3. Primary hypertension   4. Elevated blood sugar     PLAN:    In order of problems listed above: 1.  Coronary artery disease: Has moderate in-stent restenosis of remote LAD stent.  Continue medical management with aspirin, statin, and metoprolol.  I will obtain an echocardiogram.  He has some atypical symptoms that we will evaluate. 2.  Hyperlipidemia: Will check lipid panel, LFTs, LP(a) today.  If his LDL is above 70 we may intensify his atorvastatin. 3.  Hypertension: Blood pressure is well-controlled on his current regimen 4.  Elevated blood sugar: The patient has had polyuria.  Will check a hemoglobin A1c.  If elevated I will contact the patient so that he may manage this with his PCP.             Dispo:  Return in about 6 months (around 09/12/2022).      Medication Adjustments/Labs and Tests Ordered: Current medicines are reviewed at length with the patient today.  Concerns regarding medicines are outlined above.  The following changes have been made:  no change   Labs/tests ordered: Orders Placed This Encounter  Procedures   Lipoprotein A (LPA)   Lipid panel   Hemoglobin A1c   Hepatic function panel   EKG 12-Lead   ECHOCARDIOGRAM COMPLETE    Medication Changes: No orders of the defined types were placed in this encounter.    Current medicines are reviewed at length with the patient today.  The patient does not have concerns regarding medicines.   History of Present Illness:    FOCUSED PROBLEM LIST:   1.   Coronary artery disease status post stenting of LAD 1999 and PCI of RCA 2020; with residual 50 to 70% in-stent restenosis of LAD stent with no objective evidence of angina this was treated medically 2.  Hyperlipidemia 3.  Hypertension 4.  GERD  The patient is a 77 y.o. male with the indicated medical history here for cardiology follow-up the patient was last seen in October 2022 for preoperative assessment prior to a urethral stricture dilatation.  At that time he was doing well and going to the gym without any issues.  The patient has been doing fairly well.  He occasionally gets left-sided chest discomfort when he is stressed.  This does not seem to happen reliably when he exerts himself however.  He denies any severe bleeding or bruising.  Has had no recurrent or severe shortness of breath.  He has had no routine angina with exertion.  He denies any presyncope syncope.  He has had some nuisance bruising but no severe bleeding.  He has not required emergency room visits or hospitalizations.  He is a retired Engineer, maintenance (IT) and an avid Engineer, manufacturing systems       Current Medications: Current Meds  Medication Sig   acetaminophen (TYLENOL) 500 MG tablet Take 1,000 mg by mouth at bedtime as needed for mild pain (joint pain).    amphetamine-dextroamphetamine (ADDERALL) 5 MG tablet Take 1 tablet (5 mg total) by mouth 2 (  two) times daily.   aspirin EC 81 MG tablet Take 1 tablet (81 mg total) by mouth daily. Swallow whole.   atorvastatin (LIPITOR) 20 MG tablet TAKE 1 TABLET BY MOUTH IN  THE MORNING   calcium citrate (CALCITRATE - DOSED IN MG ELEMENTAL CALCIUM) 950 (200 Ca) MG tablet Take 200 mg of elemental calcium by mouth daily.   Cholecalciferol 25 MCG (1000 UT) tablet Take 1,000 Units by mouth daily.   Clobetasol Prop Emollient Base (CLOBETASOL PROPIONATE E) 0.05 % emollient cream Apply 1 application topically 2 (two) times daily.   Coenzyme Q10 (CO Q-10) 200 MG CAPS Take 200 mg by mouth daily.    Cyanocobalamin  (VITAMIN B-12) 2500 MCG SUBL Place 2,500 mcg under the tongue daily.   diclofenac Sodium (VOLTAREN) 1 % GEL Apply 1 application topically 4 (four) times daily as needed (pain).   famotidine (PEPCID) 20 MG tablet Take 20 mg by mouth every morning.   metoprolol succinate (TOPROL-XL) 25 MG 24 hr tablet TAKE 1 TABLET BY MOUTH ONCE DAILY   pantoprazole (PROTONIX) 40 MG tablet TAKE 1 TABLET BY MOUTH  DAILY   pneumococcal 20-valent conjugate vaccine (PREVNAR 20) 0.5 ML injection Inject into the muscle.   primidone (MYSOLINE) 50 MG tablet Take 1.5 tablets (75 mg total) by mouth at bedtime.     Allergies:    Patient has no known allergies.   Social History:   Social History   Tobacco Use   Smoking status: Former    Packs/day: 1.00    Years: 10.00    Total pack years: 10.00    Types: Cigarettes    Quit date: 07/09/1984    Years since quitting: 37.7   Smokeless tobacco: Never  Vaping Use   Vaping Use: Never used  Substance Use Topics   Alcohol use: Not Currently    Alcohol/week: 7.0 standard drinks of alcohol    Types: 7 Shots of liquor per week    Comment: daily 1 per day ,    Drug use: No     Family Hx: Family History  Problem Relation Age of Onset   Obesity Brother    Diabetes Brother    Parkinsonism Brother      Review of Systems:   Please see the history of present illness.    All other systems reviewed and are negative.     EKGs/Labs/Other Test Reviewed:    EKG:  EKG performed October 2022 that I personally reviewed demonstrates sinus rhythm with LVH; EKG performed today that I personally reviewed demonstrates sinus rhythm with nonpathologic Q waves in the inferior leads.  Prior CV studies:  Coronary angiography and PCI 2020: Widely patent left main 70 to 80% ISR and previously placed proximal LAD bare-metal stent.  The stent crosses over a large diagonal which contains 85% in the ostium due to jailing. Circumflex gives origin to 3 marginals.  No significant  obstruction is noted. RCA is severely obstructed, with eccentric 90 to 95% stenosis. Mild mid anterior wall hypokinesis.  EF 45 to 50%. 95% RCA reduced to 0% with TIMI grade III flow using a 2.75 x 15 Onyx deployed at 14 atm.   Angiographic reevaluation of LAD in-stent restenosis demonstrated 50 to 70%.  In absence of ischemia noted on recent nuclear study in the anteroapical segment the decision was made to continue medical therapy.  Lexiscan 2020: Nuclear stress EF: 62%. There was no ST segment deviation noted during stress. The study is normal. This is a low risk  study. The left ventricular ejection fraction is normal (55-65%).  Other studies Reviewed: Review of the additional studies/records demonstrates: No imaging demonstrating aortic atherosclerosis available  Recent Labs: 12/13/2021: ALT 15; BUN 22; Creatinine 1.17; Hemoglobin 15.1; Platelet Count 238; Potassium 4.4; Sodium 143   Recent Lipid Panel Lab Results  Component Value Date/Time   CHOL 160 11/19/2019 10:02 AM   TRIG 126 11/19/2019 10:02 AM   HDL 49 11/19/2019 10:02 AM   LDLCALC 89 11/19/2019 10:02 AM    Risk Assessment/Calculations:                Physical Exam:    VS:  BP (!) 120/58   Pulse 76   Ht '5\' 6"'$  (1.676 m)   Wt 175 lb 9.6 oz (79.7 kg)   SpO2 98%   BMI 28.34 kg/m    Wt Readings from Last 3 Encounters:  03/14/22 175 lb 9.6 oz (79.7 kg)  12/13/21 174 lb (78.9 kg)  09/20/21 178 lb 9.6 oz (81 kg)    GENERAL:  No apparent distress, AOx3 HEENT:  No carotid bruits, +2 carotid impulses, no scleral icterus CAR: RRR no murmurs, gallops, rubs, or thrills RES:  Clear to auscultation bilaterally ABD:  Soft, nontender, nondistended, positive bowel sounds x 4 VASC:  +2 radial pulses, +2 carotid pulses, palpable pedal pulses NEURO:  CN 2-12 grossly intact; motor and sensory grossly intact PSYCH:  No active depression or anxiety EXT:  No edema, ecchymosis, or cyanosis  Signed, Early Osmond, MD   03/14/2022 10:34 AM    [facility] [address], [place], [place]  [address] Phone: [phone]; Fax: [phone]   Note:  This document was prepared using Dragon voice recognition software and may include unintentional dictation errors.

## 2022-03-14 ENCOUNTER — Encounter: Payer: Self-pay | Admitting: Internal Medicine

## 2022-03-14 ENCOUNTER — Ambulatory Visit: Payer: Medicare Other | Attending: Internal Medicine | Admitting: Internal Medicine

## 2022-03-14 VITALS — BP 120/58 | HR 76 | Ht 66.0 in | Wt 175.6 lb

## 2022-03-14 DIAGNOSIS — R739 Hyperglycemia, unspecified: Secondary | ICD-10-CM | POA: Diagnosis not present

## 2022-03-14 DIAGNOSIS — E785 Hyperlipidemia, unspecified: Secondary | ICD-10-CM | POA: Insufficient documentation

## 2022-03-14 DIAGNOSIS — I10 Essential (primary) hypertension: Secondary | ICD-10-CM | POA: Insufficient documentation

## 2022-03-14 DIAGNOSIS — I2510 Atherosclerotic heart disease of native coronary artery without angina pectoris: Secondary | ICD-10-CM

## 2022-03-14 DIAGNOSIS — I25118 Atherosclerotic heart disease of native coronary artery with other forms of angina pectoris: Secondary | ICD-10-CM | POA: Insufficient documentation

## 2022-03-14 NOTE — Patient Instructions (Signed)
Medication Instructions:  No changes *If you need a refill on your cardiac medications before your next appointment, please call your pharmacy*   Lab Work: Today: lipids, liver, Lp(a), hgA1c  If you have labs (blood work) drawn today and your tests are completely normal, you will receive your results only by: [place] (if you have MyChart) OR A paper copy in the mail If you have any lab test that is abnormal or we need to change your treatment, we will call you to review the results.   Testing/Procedures: Your physician has requested that you have an echocardiogram. Echocardiography is a painless test that uses sound waves to create images of your heart. It provides your doctor with information about the size and shape of your heart and how well your heart's chambers and valves are working. This procedure takes approximately one hour. There are no restrictions for this procedure. Please do NOT wear cologne, perfume, aftershave, or lotions (deodorant is allowed). Please arrive 15 minutes prior to your appointment time.   Follow-Up: At [facility], you and your health needs are our priority.  As part of our continuing mission to provide you with exceptional heart care, we have created designated Provider Care Teams.  These Care Teams include your primary Cardiologist (physician) and Advanced Practice Providers (APPs -  Physician Assistants and Nurse Practitioners) who all work together to provide you with the care you need, when you need it.   Your next appointment:   6 month(s)  The format for your next appointment:   In Person  Provider:   Early Osmond, MD      Important Information About Sugar

## 2022-03-15 LAB — HEPATIC FUNCTION PANEL
ALT: 17 IU/L (ref 0–44)
AST: 22 IU/L (ref 0–40)
Albumin: 4.5 g/dL (ref 3.8–4.8)
Alkaline Phosphatase: 86 IU/L (ref 44–121)
Bilirubin Total: 0.9 mg/dL (ref 0.0–1.2)
Bilirubin, Direct: 0.24 mg/dL (ref 0.00–0.40)
Total Protein: 6.5 g/dL (ref 6.0–8.5)

## 2022-03-15 LAB — LIPID PANEL
Chol/HDL Ratio: 2.8 ratio (ref 0.0–5.0)
Cholesterol, Total: 158 mg/dL (ref 100–199)
HDL: 56 mg/dL (ref >39)
LDL Chol Calc (NIH): 80 mg/dL (ref 0–99)
Triglycerides: 125 mg/dL (ref 0–149)
VLDL Cholesterol Cal: 22 mg/dL (ref 5–40)

## 2022-03-15 LAB — LIPOPROTEIN A (LPA): Lipoprotein (a): 98 nmol/L — ABNORMAL HIGH (ref <75.0)

## 2022-03-15 LAB — HEMOGLOBIN A1C
Est. average glucose Bld gHb Est-mCnc: 117 mg/dL
Hgb A1c MFr Bld: 5.7 % — ABNORMAL HIGH (ref 4.8–5.6)

## 2022-03-20 NOTE — Addendum Note (Signed)
Addended by: Rodman Key on: 03/20/2022 04:05 PM   Modules accepted: Orders

## 2022-03-22 MED ORDER — ATORVASTATIN CALCIUM 40 MG PO TABS: 40.0000 mg | ORAL_TABLET | Freq: Every day | ORAL | 3 refills | Status: DC

## 2022-03-22 NOTE — Addendum Note (Signed)
Addended by: Rodman Key on: 03/22/2022 11:38 AM   Modules accepted: Orders

## 2022-03-29 DIAGNOSIS — D485 Neoplasm of uncertain behavior of skin: Secondary | ICD-10-CM | POA: Diagnosis not present

## 2022-03-29 DIAGNOSIS — D225 Melanocytic nevi of trunk: Secondary | ICD-10-CM | POA: Diagnosis not present

## 2022-03-29 DIAGNOSIS — L821 Other seborrheic keratosis: Secondary | ICD-10-CM | POA: Diagnosis not present

## 2022-03-29 DIAGNOSIS — L209 Atopic dermatitis, unspecified: Secondary | ICD-10-CM | POA: Diagnosis not present

## 2022-03-29 DIAGNOSIS — L578 Other skin changes due to chronic exposure to nonionizing radiation: Secondary | ICD-10-CM | POA: Diagnosis not present

## 2022-03-29 DIAGNOSIS — B353 Tinea pedis: Secondary | ICD-10-CM | POA: Diagnosis not present

## 2022-03-29 DIAGNOSIS — L570 Actinic keratosis: Secondary | ICD-10-CM | POA: Diagnosis not present

## 2022-04-04 ENCOUNTER — Encounter: Payer: Self-pay | Admitting: Neurology

## 2022-04-04 ENCOUNTER — Ambulatory Visit (INDEPENDENT_AMBULATORY_CARE_PROVIDER_SITE_OTHER): Payer: Medicare Other | Admitting: Neurology

## 2022-04-04 VITALS — BP 145/70 | HR 64 | Ht 66.0 in | Wt 174.0 lb

## 2022-04-04 DIAGNOSIS — G250 Essential tremor: Secondary | ICD-10-CM | POA: Diagnosis not present

## 2022-04-04 DIAGNOSIS — R2689 Other abnormalities of gait and mobility: Secondary | ICD-10-CM | POA: Diagnosis not present

## 2022-04-04 MED ORDER — PRIMIDONE 50 MG PO TABS: 75.0000 mg | ORAL_TABLET | Freq: Every day | ORAL | 3 refills | Status: DC

## 2022-04-04 NOTE — Patient Instructions (Addendum)
I will place an order for [facility]  We will continue the Primidone  Discuss any mood concerns with your primary care doctor See you back in 6 months

## 2022-04-04 NOTE — Progress Notes (Signed)
Patient: Cole Martinez Date of Birth: [birth year]  Reason for Visit: Follow up for tremor History from: Patient Primary Neurologist: Dr. Rexene Alberts  ASSESSMENT AND PLAN 77 y.o. year old male   1.  Essential tremor -Progression of tremor over time both with action and resting components -Continue primidone 75 mg at bedtime, has noted benefit, will hold off on increasing due to some increasing gait concerns, mood issues -Referral to physical therapy for gait training, unclear if increasing primidone from 50 mg to 75 mg played a part in gait change, nonetheless he wishes to continue at current dosing since he has noted benefit, he is also on low-dose metoprolol which may provide some benefit -Have recommended discussing his mood, recent personal stress with his PCP, times of stress/emotions increase tremor -Follow-up in 6 months or sooner if needed  HISTORY OF PRESENT ILLNESS: Today 04/04/22 Feels tremors worsening, right hand more than the left. Eating is affected. At last visit primidone was increased to 75 mg at bedtime. He did notice in the afternoon he can write legible. When he gets upset tremor worsens, his head will shake. His balance feels off, he hasn't fallen, but almost has. Has been referred to PT in the past, thinks this may be helpful. His Lipitor has been increased, feels impacting his memory few weeks ago. He still works as Engineer, maintenance (IT).  Goes to th gym a few days a week, needs to get back to 5 days a week. He does admit some mood issues, martial challenges.   HISTORY Mr. Cole Martinez is a 77 year old right-handed gentleman with an underlying complex medical history of coronary artery disease with status post stenting, hypertension, hyperlipidemia, elevated PSA, urethral stricture, Crohn's disease, CLL, ADHD, vitamin B12 deficiency, osteoarthritis, neuropathy, and tremor, who presents for follow-up consultation of his essential tremor.  The patient is unaccompanied today. I first met him on  03/29/2021, at which time he felt fairly stable with regards to his tremor, he had noticed more balance issues.  He was taking Mysoline low-dose 50 mg at bedtime.  We talked about the possibility of increasing the Mysoline slightly to 75 mg at bedtime, he was advised to hydrate well with water and limit his caffeine to 1-2 servings per day and discuss with cardiology the possibility of increasing his metoprolol.  He did have mild bradycardia.  He was advised to start using a cane for gait safety.   Today, 09/20/2021: He reports that his tremor has become worse.  He has not increased his Mysoline and is still on the same dose of metoprolol low-dose.  He fell a couple weeks ago while [place] in [place].  He was at a relatives house and fell in the bathroom, was carrying his phone and iPad in each hand and did not pay attention, fell backwards and bruised his rib cage, eventually he had an x-ray through orthopedics once he returned and there was no obvious fracture but he did have pain which has since then resolved thankfully.  He tries to hydrate well, limits his caffeine, he does drink 1 alcoholic beverage in the evening.  His balance is not as good.  He may start using a cane but is not ready to use a walker.  His primary care encouraged him to start using a walker.  He has had physical therapy.   REVIEW OF SYSTEMS: Out of a complete 14 system review of symptoms, the patient complains only of the following symptoms, and all other reviewed systems are negative.  See HPI  ALLERGIES: No Known Allergies  HOME MEDICATIONS: Outpatient Medications Prior to Visit  Medication Sig Dispense Refill   acetaminophen (TYLENOL) 500 MG tablet Take 1,000 mg by mouth at bedtime as needed for mild pain (joint pain).      amphetamine-dextroamphetamine (ADDERALL) 5 MG tablet Take 1 tablet (5 mg total) by mouth 2 (two) times daily. 60 tablet 0   aspirin EC 81 MG tablet Take 1 tablet (81 mg total) by mouth daily. Swallow  whole. 90 tablet 3   atorvastatin (LIPITOR) 40 MG tablet Take 1 tablet (40 mg total) by mouth daily. 90 tablet 3   calcium citrate (CALCITRATE - DOSED IN MG ELEMENTAL CALCIUM) 950 (200 Ca) MG tablet Take 200 mg of elemental calcium by mouth daily.     Cholecalciferol 25 MCG (1000 UT) tablet Take 1,000 Units by mouth daily.     Clobetasol Prop Emollient Base (CLOBETASOL PROPIONATE E) 0.05 % emollient cream Apply 1 application topically 2 (two) times daily. 30 g 1   Coenzyme Q10 (CO Q-10) 200 MG CAPS Take 200 mg by mouth daily.      Cyanocobalamin (VITAMIN B-12) 2500 MCG SUBL Place 2,000 mcg under the tongue daily.     diclofenac Sodium (VOLTAREN) 1 % GEL Apply 1 application topically 4 (four) times daily as needed (pain).     famotidine (PEPCID) 20 MG tablet Take 20 mg by mouth every morning.     metoprolol succinate (TOPROL-XL) 25 MG 24 hr tablet TAKE 1 TABLET BY MOUTH ONCE DAILY 90 tablet 3   pantoprazole (PROTONIX) 40 MG tablet TAKE 1 TABLET BY MOUTH  DAILY 90 tablet 3   primidone (MYSOLINE) 50 MG tablet Take 1.5 tablets (75 mg total) by mouth at bedtime. 135 tablet 3   pneumococcal 20-valent conjugate vaccine (PREVNAR 20) 0.5 ML injection Inject into the muscle. 0.5 mL 0   No facility-administered medications prior to visit.    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--  ONCOLOGIST-  DR [facility]   PT IS ASYMPTOMATIC--- LAST CBC W/ DIFF 06-24-2012 STABLE   Coronary artery disease CARDIOLOGIST- DR Daneen Schick   S/P STENTING LAD 1999 // Myoview 01/2019: EF 62, normal perfusion; Low Risk   Crohn's disease of ileum ([place]) [place]   ED (erectile dysfunction)    Elevated PSA    Essential and other specified forms of tremor 11/25/2012   Gait abnormality 05/17/2020   H/O adenomatous polyp of colon     Hyperlipidemia    Hypertension    Nocturia    OA (osteoarthritis)    Peripheral neuropathy    hx of, none current as of 08-04-13   Peyronie disease    S/P coronary artery stent placement OCT 1999 OF LAD   Tremor, hereditary, benign MILD RIGHT HAND    PAST SURGICAL HISTORY: Past Surgical History:  Procedure Laterality Date   CARDIOVASCULAR STRESS TEST  11-01-2010 DR Daneen Schick   NORMAL PERFUSION STUDY/ EF 64%/ NO ISCHEMIA   cataract surgery  Bilateral feburary 2020   with lens placement    COLONOSCOPY WITH PROPOFOL N/A 09/24/2012   Procedure: COLONOSCOPY WITH PROPOFOL;  Surgeon: [name], MD;  Location: [facility];  Service: Endoscopy;  Laterality: N/A;   CORONARY ANGIOPLASTY WITH STENT PLACEMENT  OCT 1999   STENT OF LAD  CORONARY STENT INTERVENTION N/A 02/14/2019   Procedure: CORONARY STENT INTERVENTION;  Surgeon: Belva Crome, MD;  Location: [place] [facility];  Service: Cardiovascular;  Laterality: N/A;   CYSTOSCOPY WITH URETHRAL DILATATION  03/12/2017   Procedure: CYSTOSCOPY WITH URETHRAL DILATATION;  Surgeon: Gaynelle Arabian, MD;  Location: [facility];  Service: Orthopedics;;  Ammie Dalton, Resident Assisting   CYSTOSCOPY WITH URETHRAL DILATATION N/A 10/03/2018   Procedure: CYSTOSCOPY WITH URETHRAL BALLOON DILATATION WITH BILATERAL RETROGRADE PYELOGRAPHY;  Surgeon: Raynelle Bring, MD;  Location: [facility];  Service: Urology;  Laterality: N/A;   CYSTOSCOPY WITH URETHRAL DILATATION N/A 12/30/2020   Procedure: CYSTOSCOPY WITH BALLOON DILATATION OF URETHRAL STRICTURE;  Surgeon: Raynelle Bring, MD;  Location: [facility];  Service: Urology;  Laterality: N/A;   LAPAROSCOPIC INGUINAL HERNIA REPAIR Bilateral 01-10-2004   W/ MESH   LEFT HEART CATH AND CORONARY ANGIOGRAPHY N/A 02/13/2019   Procedure: LEFT HEART CATH AND CORONARY ANGIOGRAPHY;  Surgeon: Belva Crome, MD;  Location: [place] [facility];  Service: Cardiovascular;  Laterality: N/A;   neck benign removed from neck  yrs ago   PROSTATE  BIOPSY N/A 07/12/2012   Procedure: PROSTATE BIOPSY AND ULTRASOUND;  Surgeon: Ailene Rud, MD;  Location: [facility];  Service: Urology;  Laterality: N/A;   PROSTATE SURGERY  2002   tuna   REMOVAL LEFT NECK LYMPH NODE  1996   TONSILLECTOMY  CHILD   TOTAL KNEE ARTHROPLASTY Right 03/12/2017   Procedure: RIGHT TOTAL KNEE ARTHROPLASTY;  Surgeon: Gaynelle Arabian, MD;  Location: [facility];  Service: Orthopedics;  Laterality: Right;  Adductor Block   TRANSURETHRAL RESECTION OF PROSTATE N/A 08/08/2013   Procedure: TRANSURETHRAL RESECTION OF THE PROSTATE WITH GYRUS INSTRUMENTS;  Surgeon: Ailene Rud, MD;  Location: [facility];  Service: Urology;  Laterality: N/A;   TRANSURETHRAL RESECTION OF PROSTATE      FAMILY HISTORY: Family History  Problem Relation Age of Onset   Obesity Brother    Diabetes Brother    Parkinsonism Brother     SOCIAL HISTORY: Social History   Socioeconomic History   Marital status: Married    Spouse name: [name]   Number of children: 3   Years of education: 16   Highest education level: Bachelor's degree (e.g., BA, AB, BS)  Occupational History   Occupation: part time    Comment: self employed Engineer, maintenance (IT)  Tobacco Use   Smoking status: Former    Packs/day: 1.00    Years: 10.00    Total pack years: 10.00    Types: Cigarettes    Quit date: 07/09/1984    Years since quitting: 37.7   Smokeless tobacco: Never  Vaping Use   Vaping Use: Never used  Substance and Sexual Activity   Alcohol use: Not Currently    Alcohol/week: 7.0 standard drinks of alcohol    Types: 7 Shots of liquor per week    Comment: daily 1 per day ,    Drug use: No   Sexual activity: Not on file  Other Topics Concern   Not on file  Social History Narrative   Lives at home with is wife, [name].     Right handed   Drinks 2-3 cups coffee daily   Social Determinants of Health   Financial Resource Strain: Not on file  Food Insecurity: No Food Insecurity (03/20/2019)   Hunger  Vital Sign    Worried About Running Out of Food in the Last Year: Never true    Ran Out of Food in  the Last Year: Never true  Transportation Needs: No Transportation Needs (03/20/2019)   PRAPARE - Hydrologist (Medical): No    Lack of Transportation (Non-Medical): No  Physical Activity: Insufficiently Active (03/20/2019)   Exercise Vital Sign    Days of Exercise per Week: 3 days    Minutes of Exercise per Session: 40 min  Stress: Not on file  Social Connections: Unknown (03/20/2019)   Social Connection and Isolation Panel [NHANES]    Frequency of Communication with Friends and Family: More than three times a week    Frequency of Social Gatherings with Friends and Family: Once a week    Attends Religious Services: Not on Advertising copywriter or Organizations: Not on file    Attends Archivist Meetings: Not on file    Marital Status: Married  Intimate Partner Violence: Not on file    PHYSICAL EXAM  Vitals:   04/04/22 0832  BP: (!) 145/70  Pulse: 64  Weight: 174 lb (78.9 kg)  Height: '5\' 6"'$  (1.676 m)   Body mass index is 28.08 kg/m.  Generalized: Well developed, in no acute distress  Neurological examination  Mentation: Alert oriented to time, place, history taking. Follows all commands speech and language fluent Cranial nerve II-XII: Pupils were equal round reactive to light. Extraocular movements were full, visual field were full on confrontational test. Facial sensation and strength were normal. . Head turning and shoulder shrug  were normal and symmetric.  Mild head tremor noted. Motor: The motor testing reveals 5 over 5 strength of all 4 extremities. Good symmetric motor tone is noted throughout.  Intermittently mild resting tremor to bilateral upper extremities, slightly more on the left.  Slight decreased hand taps to the left. Sensory: Sensory testing is intact to soft touch on all 4 extremities. No evidence of extinction is  noted.  Coordination: Cerebellar testing reveals good finger-nose-finger and heel-to-shin bilaterally.  No tremor noted.  Mild tremor translated into handwriting and spiral drawl. Gait and station: Gait is slightly wide-based, left leg is rotated externally, some instability with turns, no assistive device. Reflexes: Deep tendon reflexes are symmetric and normal bilaterally.   DIAGNOSTIC DATA (LABS, IMAGING, TESTING) - I reviewed patient records, labs, notes, testing and imaging myself where available.  Lab Results  Component Value Date   WBC 8.6 12/13/2021   HGB 15.1 12/13/2021   HCT 46.1 12/13/2021   MCV 92.8 12/13/2021   PLT 238 12/13/2021      Component Value Date/Time   NA 143 12/13/2021 0934   NA 140 03/03/2019 0913   K 4.4 12/13/2021 0934   CL 107 12/13/2021 0934   CO2 29 12/13/2021 0934   GLUCOSE 104 (H) 12/13/2021 0934   BUN 22 12/13/2021 0934   BUN 22 03/03/2019 0913   CREATININE 1.17 12/13/2021 0934   CALCIUM 9.2 12/13/2021 0934   PROT 6.5 03/14/2022 1012   ALBUMIN 4.5 03/14/2022 1012   AST 22 03/14/2022 1012   AST 20 12/13/2021 0934   ALT 17 03/14/2022 1012   ALT 15 12/13/2021 0934   ALKPHOS 86 03/14/2022 1012   BILITOT 0.9 03/14/2022 1012   BILITOT 0.7 12/13/2021 0934   GFRNONAA >60 12/13/2021 0934   GFRAA >60 04/29/2019 0635   Lab Results  Component Value Date   CHOL 158 03/14/2022   HDL 56 03/14/2022   LDLCALC 80 03/14/2022   TRIG 125 03/14/2022   CHOLHDL 2.8 03/14/2022   Lab  Results  Component Value Date   HGBA1C 5.7 (H) 03/14/2022   Lab Results  Component Value Date   BOFPULGS93 241 04/20/2020   No results found for: "TSH"  Butler Denmark, AGNP-C, DNP 04/04/2022, 9:13 AM [facility] [address] [place], [place] [address] [phone]

## 2022-04-07 ENCOUNTER — Ambulatory Visit (HOSPITAL_COMMUNITY): Payer: Medicare Other | Attending: Internal Medicine

## 2022-04-07 DIAGNOSIS — I10 Essential (primary) hypertension: Secondary | ICD-10-CM | POA: Diagnosis not present

## 2022-04-07 DIAGNOSIS — I25118 Atherosclerotic heart disease of native coronary artery with other forms of angina pectoris: Secondary | ICD-10-CM | POA: Insufficient documentation

## 2022-04-07 DIAGNOSIS — E785 Hyperlipidemia, unspecified: Secondary | ICD-10-CM | POA: Insufficient documentation

## 2022-04-07 LAB — ECHOCARDIOGRAM COMPLETE
Area-P 1/2: 3.76 cm2
S' Lateral: 2.6 cm

## 2022-04-10 ENCOUNTER — Other Ambulatory Visit: Payer: Self-pay

## 2022-04-10 ENCOUNTER — Ambulatory Visit: Payer: Medicare Other | Attending: Neurology

## 2022-04-10 DIAGNOSIS — M6281 Muscle weakness (generalized): Secondary | ICD-10-CM | POA: Diagnosis not present

## 2022-04-10 DIAGNOSIS — Z9181 History of falling: Secondary | ICD-10-CM | POA: Diagnosis not present

## 2022-04-10 DIAGNOSIS — G250 Essential tremor: Secondary | ICD-10-CM | POA: Insufficient documentation

## 2022-04-10 DIAGNOSIS — R293 Abnormal posture: Secondary | ICD-10-CM | POA: Insufficient documentation

## 2022-04-10 DIAGNOSIS — R2681 Unsteadiness on feet: Secondary | ICD-10-CM | POA: Insufficient documentation

## 2022-04-10 DIAGNOSIS — R2689 Other abnormalities of gait and mobility: Secondary | ICD-10-CM | POA: Insufficient documentation

## 2022-04-10 NOTE — Therapy (Signed)
OUTPATIENT PHYSICAL THERAPY NEURO EVALUATION   Patient Name: Cole Martinez MRN: [id number] DOB:[birth year], 77 y.o., male Today's Date: 04/10/2022   PCP: Josetta Huddle, MD REFERRING PROVIDER: Suzzanne Cloud, NP  END OF SESSION:  PT End of Session - 04/10/22 0924     Visit Number 1    Number of Visits 6    Date for PT Re-Evaluation 05/22/22    Authorization Type Medicare A&B/ [place]    Progress Note Due on Visit 10    PT Start Time 0930    PT Stop Time 9390    PT Time Calculation (min) 45 min             Past Medical History:  Diagnosis Date   ADHD (attention deficit hyperactivity disorder)    B12 deficiency    BPH (benign prostatic hypertrophy)    Chronic lymphocytic leukemia (CLL), T-cell ([place]) [place]--  ONCOLOGIST-  DR Benay Spice   PT IS ASYMPTOMATIC--- LAST CBC W/ DIFF 06-24-2012 STABLE   Coronary artery disease CARDIOLOGIST- DR Daneen Schick   S/P STENTING LAD 1999 // Myoview 01/2019: EF 62, normal perfusion; Low Risk   Crohn's disease of ileum ([place]) [place]   ED (erectile dysfunction)    Elevated PSA    Essential and other specified forms of tremor 11/25/2012   Gait abnormality 05/17/2020   H/O adenomatous polyp of colon    Hyperlipidemia    Hypertension    Nocturia    OA (osteoarthritis)    Peripheral neuropathy    hx of, none current as of 08-04-13   Peyronie disease    S/P coronary artery stent placement OCT 1999 OF LAD   Tremor, hereditary, benign MILD RIGHT HAND   Past Surgical History:  Procedure Laterality Date   CARDIOVASCULAR STRESS TEST  11-01-2010 DR Daneen Schick   NORMAL PERFUSION STUDY/ EF 64%/ NO ISCHEMIA   cataract surgery  Bilateral feburary 2020   with lens placement    COLONOSCOPY WITH PROPOFOL N/A 09/24/2012   Procedure: COLONOSCOPY WITH PROPOFOL;  Surgeon: [name], MD;  Location: [facility];  Service: Endoscopy;  Laterality: N/A;   CORONARY ANGIOPLASTY WITH STENT PLACEMENT  OCT 1999   STENT OF LAD   CORONARY STENT  INTERVENTION N/A 02/14/2019   Procedure: CORONARY STENT INTERVENTION;  Surgeon: Belva Crome, MD;  Location: [facility];  Service: Cardiovascular;  Laterality: N/A;   CYSTOSCOPY WITH URETHRAL DILATATION  03/12/2017   Procedure: CYSTOSCOPY WITH URETHRAL DILATATION;  Surgeon: Gaynelle Arabian, MD;  Location: [facility];  Service: Orthopedics;;  Ammie Dalton, Resident Assisting   CYSTOSCOPY WITH URETHRAL DILATATION N/A 10/03/2018   Procedure: CYSTOSCOPY WITH URETHRAL BALLOON DILATATION WITH BILATERAL RETROGRADE PYELOGRAPHY;  Surgeon: Raynelle Bring, MD;  Location: [facility];  Service: Urology;  Laterality: N/A;   CYSTOSCOPY WITH URETHRAL DILATATION N/A 12/30/2020   Procedure: CYSTOSCOPY WITH BALLOON DILATATION OF URETHRAL STRICTURE;  Surgeon: Raynelle Bring, MD;  Location: [facility];  Service: Urology;  Laterality: N/A;   LAPAROSCOPIC INGUINAL HERNIA REPAIR Bilateral 01-10-2004   W/ MESH   LEFT HEART CATH AND CORONARY ANGIOGRAPHY N/A 02/13/2019   Procedure: LEFT HEART CATH AND CORONARY ANGIOGRAPHY;  Surgeon: Belva Crome, MD;  Location: [facility];  Service: Cardiovascular;  Laterality: N/A;   neck benign removed from neck  yrs ago   PROSTATE BIOPSY N/A 07/12/2012   Procedure: PROSTATE BIOPSY AND ULTRASOUND;  Surgeon: Ailene Rud, MD;  Location[facility];  Service: Urology;  Laterality:  N/A;   PROSTATE SURGERY  2002   tuna   REMOVAL LEFT NECK LYMPH NODE  1996   TONSILLECTOMY  CHILD   TOTAL KNEE ARTHROPLASTY Right 03/12/2017   Procedure: RIGHT TOTAL KNEE ARTHROPLASTY;  Surgeon: Gaynelle Arabian, MD;  Location: [facility];  Service: Orthopedics;  Laterality: Right;  Adductor Block   TRANSURETHRAL RESECTION OF PROSTATE N/A 08/08/2013   Procedure: TRANSURETHRAL RESECTION OF THE PROSTATE WITH GYRUS INSTRUMENTS;  Surgeon: Ailene Rud, MD;  Location: [facility];  Service: Urology;  Laterality: N/A;   TRANSURETHRAL RESECTION OF PROSTATE     Patient Active Problem List   Diagnosis  Date Noted   Adult attention deficit disorder 06/04/2020   Chronic lymphocytic leukemia ([place]) 06/04/2020   Conductive hearing loss, bilateral 06/04/2020   Crohn's ileitis ([place]) 06/04/2020   Disorder of musculoskeletal system 06/04/2020   Erectile dysfunction 06/04/2020   Gastroesophageal reflux disease 06/04/2020   Gout 06/04/2020   History of adenomatous polyp of colon 06/04/2020   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[place]) 06/04/2020   Hypothyroidism 06/04/2020   Increased frequency of urination 06/04/2020   Male hypogonadism 06/04/2020   Muscle pain 06/04/2020   Nocturia 06/04/2020   Obesity 06/04/2020   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   Osteopenia 06/04/2020   Peripheral neurogenic pain 06/04/2020   Pure hypercholesterolemia 06/04/2020   Recurrent falls 06/04/2020   Unspecified kyphosis, thoracic region 06/04/2020   Vitamin B12 deficiency 06/04/2020   Vitamin D deficiency 06/04/2020   Gait abnormality 05/17/2020   Ataxia 03/16/2020   Angina pectoris ([place]) 02/13/2019   Pseudophakia of both eyes 08/01/2018   History of total knee arthroplasty 03/29/2017   Stiffness of right knee 03/16/2017   OA (osteoarthritis) of knee 03/12/2017   Tremor, essential 12/03/2015   Essential hypertension 12/09/2014   Coronary artery disease involving native heart 12/08/2013   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[place]) 12/08/2013   Hyperlipidemia 12/08/2013   Benign prostatic hypertrophy 08/08/2013   Essential and other specified forms of tremor 11/25/2012    ONSET DATE: tremor has been present for years  REFERRING DIAG: R26.89 (ICD-10-CM) - Balance problem G25.0 (ICD-10-CM) - Essential tremor  THERAPY DIAG:  Unsteadiness on feet  Other abnormalities of gait and mobility  Abnormal posture  Muscle weakness (generalized)  Rationale for Evaluation and Treatment: Rehabilitation  SUBJECTIVE:                                                                                                                                                                                              SUBJECTIVE STATEMENT: Worsening issue of essential tremor and notes that balance has suffered as a result. Does  not wish to walk with cane. Go to the gym "sort of", notes not as disciplined as it used to be. When goes to gym uses recumbent bike. Walks daily with dog. Notes difficulty with uneven surfaces. Notes some left knee issues at present but denies locking, buckling, catching. Chief complaint is balance issues and desire to not use AD for ambulation Pt accompanied by: self  PERTINENT HISTORY: underlying complex medical history of coronary artery disease with status post stenting, hypertension, hyperlipidemia, elevated PSA, urethral stricture, Crohn's disease, CLL, ADHD, vitamin B12 deficiency, , R TKR, osteoarthritis, neuropathy, and tremor,  PAIN:  Are you having pain? No  PRECAUTIONS: Fall  WEIGHT BEARING RESTRICTIONS: No  FALLS: Has patient fallen in last 6 months? No, reports fall 9 months ago, but fearful of falling  LIVING ENVIRONMENT: Lives with: lives with their spouse Lives in: House/apartment Stairs: Yes: Internal: flight steps; on right going up and External: yes steps; can reach both Has following equipment at home: None  PLOF: Independent  PATIENT GOALS: improve balance, upper body strength. Not willing to use AD  OBJECTIVE:   DIAGNOSTIC FINDINGS:   COGNITION: Overall cognitive status: Within functional limits for tasks assessed   SENSATION: Not tested  COORDINATION:   EDEMA:    MUSCLE TONE: resting and action tremor noted, UE > LE   MUSCLE LENGTH: NT  DTRs:    POSTURE: forward head  LOWER EXTREMITY ROM:     Active  Right Eval Left Eval  Hip flexion    Hip extension    Hip abduction    Hip adduction    Hip internal rotation    Hip external rotation    Knee flexion    Knee extension    Ankle dorsiflexion 10 10  Ankle plantarflexion    Ankle  inversion    Ankle eversion     (Blank rows = not tested)  LOWER EXTREMITY MMT:    MMT Right Eval Left Eval  Hip flexion 5 4  Hip extension    Hip abduction 5 4  Hip adduction    Hip internal rotation    Hip external rotation    Knee flexion 5 5  Knee extension 5 4  Ankle dorsiflexion 5 5  Ankle plantarflexion    Ankle inversion    Ankle eversion    (Blank rows = not tested)  BED MOBILITY:  NT  TRANSFERS: Assistive device utilized: None  Sit to stand: Complete Independence Stand to sit: Complete Independence Chair to chair: Complete Independence Floor:  NT  RAMP:  NT  CURB:  NT  STAIRS: Level of Assistance: Modified independence Stair Negotiation Technique: Alternating Pattern  with Single Rail on Right Single Rail on Left Number of Stairs: 15  Height of Stairs: 4-6  Comments:   GAIT: Gait pattern:  unsteady  tremoring presnet Distance walked:  Assistive device utilized: None Level of assistance: Complete Independence and Modified independence Comments: reduced velocity  FUNCTIONAL TESTS:  Timed up and go (TUG): NT 10 meter walk test: 2.66 ft/sec Berg Balance Scale: NT Functional gait assessment: 16/30  M-CTSIB  Condition 1: Firm Surface, EO 30 Sec, Normal and Mild Sway  Condition 2: Firm Surface, EC 10 Sec, Severe Sway  Condition 3: Foam Surface, EO 20 Sec, Moderate and Severe Sway  Condition 4: Foam Surface, EC 8 Sec, Severe Sway    PATIENT SURVEYS:    TODAY'S TREATMENT:  DATE: see HEP    PATIENT EDUCATION: Education details: assessment findings, rationale for PT intervention, HEP initiation Person educated: Patient Education method: Explanation, Demonstration, and Handouts Education comprehension: verbalized understanding, returned demonstration, and needs further education  HOME EXERCISE PROGRAM: Access Code:  [email or web address] URL: [email or web address] Date: 04/10/2022 Prepared by: Sherlyn Lees  Exercises - Standing Balance in [place]  - 1 x daily - 7 x weekly - 3 sets - 30 sec hold - Standing Balance in Corner with Eyes Closed  - 1 x daily - 7 x weekly - 3 sets - 30 sec hold - Corner Balance Feet Together: Eyes Open With Head Turns  - 1 x daily - 7 x weekly - 3 sets - 3-5 reps - Corner Balance Feet Together: Eyes Closed With Head Turns  - 1 x daily - 7 x weekly - 3 sets - 3-5 reps - Seated Cervical Retraction  - 1 x daily - 7 x weekly - 3 sets - 10 reps - 3-5 sec hold - Standing Cervical Retraction  - 1 x daily - 7 x weekly - 3 sets - 10 reps  GOALS: Goals reviewed with patient? Yes  SHORT TERM GOALS: Target date: 05/01/2022      Patient will be independent in HEP to improve functional outcomes Baseline: Goal status: INITIAL  2.  Demo improved balance and safety with activities such as standing/showering maintaining normal-mild sway per 30 sec condition 2 M-CTSIB Baseline: 10 sec, severe sway/retro LOB Goal status: INITIAL   LONG TERM GOALS: Target date: 05/22/2022    Demo independence with advanced HEP that could include, but not limited to: strength, balance, coordination, posture, and ambulation practices Baseline:  Goal status: INITIAL  2.  Demo 5/5 LLE strength to improve single limb support for improved gait stability Baseline: 4/5 LLE Goal status: INITIAL  3.  Demo improved balance and reduced risk for falls per score 25/30 Functional Gait Assessment Baseline: 16/30 Goal status: INITIAL    ASSESSMENT:  CLINICAL IMPRESSION: Patient is a 77 y.o. gentleman who was seen today for physical therapy evaluation and treatment for gait and balance disorder. Confounding issues include balance and motor control deficits with essential tremor present affecting coordination and fluidity of movement. Demonstrates significant gait deviations and high risk for falls per FGA  score and poor postural control with eyes closed/low light conditions resulting in backwards LOB during exam in conditions 2 and 4 M-CTSIB. Patient would benefit from continued rehab services to address strength deficits and mobility impairment to reduce risk for falls and improve safety with ambulation and activities of choice.    OBJECTIVE IMPAIRMENTS: Abnormal gait, decreased activity tolerance, decreased balance, decreased coordination, decreased knowledge of use of DME, difficulty walking, decreased strength, impaired perceived functional ability, and postural dysfunction.   ACTIVITY LIMITATIONS: carrying, lifting, squatting, stairs, transfers, and locomotion level  PARTICIPATION LIMITATIONS: interpersonal relationship, community activity, and yard work  PERSONAL FACTORS: Age, Time since onset of injury/illness/exacerbation, and 1-2 comorbidities: essential tremor, Right TKR  are also affecting patient's functional outcome.   REHAB POTENTIAL: Good  CLINICAL DECISION MAKING: Evolving/moderate complexity  EVALUATION COMPLEXITY: Moderate  PLAN:  PT FREQUENCY: 1x/week  PT DURATION: 6 weeks  PLANNED INTERVENTIONS: Therapeutic exercises, Therapeutic activity, Neuromuscular re-education, Balance training, Gait training, Patient/Family education, Self Care, Joint mobilization, Stair training, Vestibular training, Canalith repositioning, Orthotic/Fit training, DME instructions, Aquatic Therapy, Dry Needling, Electrical stimulation, Spinal mobilization, Cryotherapy, Moist heat, Taping, Ionotophoresis '4mg'$ /ml Dexamethasone, and Manual therapy  PLAN FOR NEXT SESSION: vestibular/ocular assessment, HEP  review   4:50 PM, 04/10/22 M. Sherlyn Lees, PT, DPT Physical Therapist- [place] Office Number: [phone]

## 2022-04-21 ENCOUNTER — Ambulatory Visit: Payer: Medicare Other

## 2022-04-21 DIAGNOSIS — R2689 Other abnormalities of gait and mobility: Secondary | ICD-10-CM | POA: Diagnosis not present

## 2022-04-21 DIAGNOSIS — R293 Abnormal posture: Secondary | ICD-10-CM

## 2022-04-21 DIAGNOSIS — M6281 Muscle weakness (generalized): Secondary | ICD-10-CM

## 2022-04-21 DIAGNOSIS — R2681 Unsteadiness on feet: Secondary | ICD-10-CM

## 2022-04-21 DIAGNOSIS — Z9181 History of falling: Secondary | ICD-10-CM

## 2022-04-21 DIAGNOSIS — G250 Essential tremor: Secondary | ICD-10-CM | POA: Diagnosis not present

## 2022-04-21 NOTE — Therapy (Signed)
OUTPATIENT PHYSICAL THERAPY NEURO TREATMENT   Patient Name: Cole Martinez MRN: [id number] DOB:Feb 23, 1946, 77 y.o., male Today's Date: 04/21/2022   PCP: Josetta Huddle, MD REFERRING PROVIDER: Suzzanne Cloud, NP  END OF SESSION:  PT End of Session - 04/21/22 0928     Visit Number 2    Number of Visits 6    Date for PT Re-Evaluation 05/22/22    Authorization Type Medicare A&B/ [place]    Progress Note Due on Visit 10    PT Start Time 0930    PT Stop Time [id number]    PT Time Calculation (min) 45 min             Past Medical History:  Diagnosis Date   ADHD (attention deficit hyperactivity disorder)    B12 deficiency    BPH (benign prostatic hypertrophy)    Chronic lymphocytic leukemia (CLL), 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--  ONCOLOGIST-  DR Benay Spice   PT IS ASYMPTOMATIC--- LAST CBC W/ DIFF 06-24-2012 STABLE   Coronary artery disease CARDIOLOGIST- DR Daneen Schick   S/P STENTING LAD 1999 // Myoview 01/2019: EF 62, normal perfusion; Low Risk   Crohn's disease of ileum ([place]) [place]   ED (erectile dysfunction)    Elevated PSA    Essential and other specified forms of tremor 11/25/2012   Gait abnormality 05/17/2020   H/O adenomatous polyp of colon    Hyperlipidemia    Hypertension    Nocturia    OA (osteoarthritis)    Peripheral neuropathy    hx of, none current as of 08-04-13   Peyronie disease    S/P coronary artery stent placement OCT 1999 OF LAD   Tremor, hereditary, benign MILD RIGHT HAND   Past Surgical History:  Procedure Laterality Date   CARDIOVASCULAR STRESS TEST  11-01-2010 DR Daneen Schick   NORMAL PERFUSION STUDY/ EF 64%/ NO ISCHEMIA   cataract surgery  Bilateral feburary 2020   with lens placement    COLONOSCOPY WITH PROPOFOL N/A 09/24/2012   Procedure: COLONOSCOPY WITH PROPOFOL;  Surgeon: [name], MD;  Location: [facility];  Service: Endoscopy;  Laterality: N/A;   CORONARY ANGIOPLASTY WITH STENT PLACEMENT  OCT 1999   STENT OF LAD   CORONARY STENT  INTERVENTION N/A 02/14/2019   Procedure: CORONARY STENT INTERVENTION;  Surgeon: Belva Crome, MD;  Location: [facility];  Service: Cardiovascular;  Laterality: N/A;   CYSTOSCOPY WITH URETHRAL DILATATION  03/12/2017   Procedure: CYSTOSCOPY WITH URETHRAL DILATATION;  Surgeon: Gaynelle Arabian, MD;  Location: [facility];  Service: Orthopedics;;  Ammie Dalton, Resident Assisting   CYSTOSCOPY WITH URETHRAL DILATATION N/A 10/03/2018   Procedure: CYSTOSCOPY WITH URETHRAL BALLOON DILATATION WITH BILATERAL RETROGRADE PYELOGRAPHY;  Surgeon: Raynelle Bring, MD;  Location: [facility];  Service: Urology;  Laterality: N/A;   CYSTOSCOPY WITH URETHRAL DILATATION N/A 12/30/2020   Procedure: CYSTOSCOPY WITH BALLOON DILATATION OF URETHRAL STRICTURE;  Surgeon: Raynelle Bring, MD;  Location: [facility];  Service: Urology;  Laterality: N/A;   LAPAROSCOPIC INGUINAL HERNIA REPAIR Bilateral 01-10-2004   W/ MESH   LEFT HEART CATH AND CORONARY ANGIOGRAPHY N/A 02/13/2019   Procedure: LEFT HEART CATH AND CORONARY ANGIOGRAPHY;  Surgeon: Belva Crome, MD;  Location: [facility];  Service: Cardiovascular;  Laterality: N/A;   neck benign removed from neck  yrs ago   PROSTATE BIOPSY N/A 07/12/2012   Procedure: PROSTATE BIOPSY AND ULTRASOUND;  Surgeon: Ailene Rud, MD;  Location: [facility];  Service: Urology;  Laterality:  N/A;   PROSTATE SURGERY  2002   tuna   REMOVAL LEFT NECK LYMPH NODE  1996   TONSILLECTOMY  CHILD   TOTAL KNEE ARTHROPLASTY Right 03/12/2017   Procedure: RIGHT TOTAL KNEE ARTHROPLASTY;  Surgeon: Gaynelle Arabian, MD;  Location: [facility];  Service: Orthopedics;  Laterality: Right;  Adductor Block   TRANSURETHRAL RESECTION OF PROSTATE N/A 08/08/2013   Procedure: TRANSURETHRAL RESECTION OF THE PROSTATE WITH GYRUS INSTRUMENTS;  Surgeon: Ailene Rud, MD;  Location: [facility];  Service: Urology;  Laterality: N/A;   TRANSURETHRAL RESECTION OF PROSTATE     Patient Active Problem List   Diagnosis  Date Noted   Adult attention deficit disorder 06/04/2020   Chronic lymphocytic leukemia ([place]) 06/04/2020   Conductive hearing loss, bilateral 06/04/2020   Crohn's ileitis ([place]) 06/04/2020   Disorder of musculoskeletal system 06/04/2020   Erectile dysfunction 06/04/2020   Gastroesophageal reflux disease 06/04/2020   Gout 06/04/2020   History of adenomatous polyp of colon 06/04/2020   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   Hypothyroidism 06/04/2020   Increased frequency of urination 06/04/2020   Male hypogonadism 06/04/2020   Muscle pain 06/04/2020   Nocturia 06/04/2020   Obesity 06/04/2020   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   Osteopenia 06/04/2020   Peripheral neurogenic pain 06/04/2020   Pure hypercholesterolemia 06/04/2020   Recurrent falls 06/04/2020   Unspecified kyphosis, thoracic region 06/04/2020   Vitamin B12 deficiency 06/04/2020   Vitamin D deficiency 06/04/2020   Gait abnormality 05/17/2020   Ataxia 03/16/2020   Angina pectoris ([place]) 02/13/2019   Pseudophakia of both eyes 08/01/2018   History of total knee arthroplasty 03/29/2017   Stiffness of right knee 03/16/2017   OA (osteoarthritis) of knee 03/12/2017   Tremor, essential 12/03/2015   Essential hypertension 12/09/2014   Coronary artery disease involving native heart 12/08/2013   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[place]) 12/08/2013   Hyperlipidemia 12/08/2013   Benign prostatic hypertrophy 08/08/2013   Essential and other specified forms of tremor 11/25/2012    ONSET DATE: tremor has been present for years  REFERRING DIAG: R26.89 (ICD-10-CM) - Balance problem G25.0 (ICD-10-CM) - Essential tremor  THERAPY DIAG:  Unsteadiness on feet  Other abnormalities of gait and mobility  Abnormal posture  Muscle weakness (generalized)  History of falling  Rationale for Evaluation and Treatment: Rehabilitation  SUBJECTIVE:                                                                                                                                                                                              SUBJECTIVE STATEMENT: Bought a cane but not sure how to use Pt accompanied  by: self  PERTINENT HISTORY: underlying complex medical history of coronary artery disease with status post stenting, hypertension, hyperlipidemia, elevated PSA, urethral stricture, Crohn's disease, CLL, ADHD, vitamin B12 deficiency, , R TKR, osteoarthritis, neuropathy, and tremor,  PAIN:  Are you having pain? No  PRECAUTIONS: Fall  WEIGHT BEARING RESTRICTIONS: No  FALLS: Has patient fallen in last 6 months? No, reports fall 9 months ago, but fearful of falling  LIVING ENVIRONMENT: Lives with: lives with their spouse Lives in: House/apartment Stairs: Yes: Internal: flight steps; on right going up and External: yes steps; can reach both Has following equipment at home: None  PLOF: Independent  PATIENT GOALS: improve balance, upper body strength. Not willing to use AD  OBJECTIVE:   TODAY'S TREATMENT: 04/21/22 Activity Comments  HEP review -Chin retractions: 10x 3 sec hold--use of mirror for feedback -Corner balance: feet apart EO/EC x 30 sec. Feet together w/ head turns EO/EC 5x  Vestibular exam -see below  Aerobic intervals NU-step: resistance intervals 30 sec high resist, 1 min light. Speed intervals: 15 sec fast; 30 sec light             PATIENT EDUCATION: Education details: assessment findings, rationale for PT intervention, HEP initiation Person educated: Patient Education method: Explanation, Demonstration, and Handouts Education comprehension: verbalized understanding, returned demonstration, and needs further education  HOME EXERCISE PROGRAM: Access Code: [email or web address] URL: [email or web address] Date: 04/10/2022 Prepared by: Sherlyn Lees  Exercises - Standing Balance in Corner  - 1 x daily - 7 x weekly - 3 sets - 30 sec hold - Standing Balance in Corner with Eyes  Closed  - 1 x daily - 7 x weekly - 3 sets - 30 sec hold - Corner Balance Feet Together: Eyes Open With Head Turns  - 1 x daily - 7 x weekly - 3 sets - 3-5 reps - Corner Balance Feet Together: Eyes Closed With Head Turns  - 1 x daily - 7 x weekly - 3 sets - 3-5 reps - Seated Cervical Retraction  - 1 x daily - 7 x weekly - 3 sets - 10 reps - 3-5 sec hold - Standing Cervical Retraction  - 1 x daily - 7 x weekly - 3 sets - 10 reps  VESTIBULAR ASSESSMENT   GENERAL OBSERVATION: wears progressive lens    SYMPTOM BEHAVIOR:   Subjective history: poor balance   Non-Vestibular symptoms: changes in hearing   Type of dizziness: Unsteady with head/body turns   Frequency: varies   Duration: varies   Aggravating factors:    Relieving factors: slow movements   Progression of symptoms: insidious   OCULOMOTOR EXAM:   Ocular Alignment: normal    Ocular ROM: No Limitations    Spontaneous Nystagmus: absent    Gaze-Induced Nystagmus: absent    Smooth Pursuits: saccades, more towards right tracking    Saccades: hypometric/undershoots and more in a vertical direction     Convergence/Divergence: 4 cm cm    Horizontal head shaking - negative  Vertical head shaking - induced nystagmus: negative      VESTIBULAR - OCULAR REFLEX:    Slow VOR: Normal   VOR Cancellation: Normal   Head-Impulse Test: HIT Right: positive HIT Left: negative     Dynamic Visual Acuity: Not able to be assessed      POSITIONAL TESTING: NT due to no c/o positional vertigo     MOTION SENSITIVITY:    Motion Sensitivity Quotient  Intensity: 0 = none, 1 = Lightheaded, 2 = Mild, 3 = Moderate,  4 = Severe, 5 = Vomiting  Intensity  1. Sitting to supine   2. Supine to L side   3. Supine to R side   4. Supine to sitting   5. L Hallpike-Dix   6. Up from L    7. R Hallpike-Dix   8. Up from R    9. Sitting, head  tipped to L knee   10. Head up from L  knee   11. Sitting, head  tipped to R knee   12. Head up from R   knee   13. Sitting head turns x5   14.Sitting head nods x5   15. In stance, 180  turn to L    16. In stance, 180  turn to R     OTHOSTATICS: not done    DIAGNOSTIC FINDINGS:   COGNITION: Overall cognitive status: Within functional limits for tasks assessed   SENSATION: Not tested  COORDINATION:   EDEMA:    MUSCLE TONE: resting and action tremor noted, UE > LE   MUSCLE LENGTH: NT  DTRs:    POSTURE: forward head  LOWER EXTREMITY ROM:     Active  Right Eval Left Eval  Hip flexion    Hip extension    Hip abduction    Hip adduction    Hip internal rotation    Hip external rotation    Knee flexion    Knee extension    Ankle dorsiflexion 10 10  Ankle plantarflexion    Ankle inversion    Ankle eversion     (Blank rows = not tested)  LOWER EXTREMITY MMT:    MMT Right Eval Left Eval  Hip flexion 5 4  Hip extension    Hip abduction 5 4  Hip adduction    Hip internal rotation    Hip external rotation    Knee flexion 5 5  Knee extension 5 4  Ankle dorsiflexion 5 5  Ankle plantarflexion    Ankle inversion    Ankle eversion    (Blank rows = not tested)  BED MOBILITY:  NT  TRANSFERS: Assistive device utilized: None  Sit to stand: Complete Independence Stand to sit: Complete Independence Chair to chair: Complete Independence Floor:  NT  RAMP:  NT  CURB:  NT  STAIRS: Level of Assistance: Modified independence Stair Negotiation Technique: Alternating Pattern  with Single Rail on Right Single Rail on Left Number of Stairs: 15  Height of Stairs: 4-6  Comments:   GAIT: Gait pattern:  unsteady  tremoring presnet Distance walked:  Assistive device utilized: None Level of assistance: Complete Independence and Modified independence Comments: reduced velocity  FUNCTIONAL TESTS:  Timed up and go (TUG): NT 10 meter walk test: 2.66 ft/sec Berg Balance Scale: NT Functional gait assessment: 16/30  M-CTSIB  Condition 1: Firm Surface,  EO 30 Sec, Normal and Mild Sway  Condition 2: Firm Surface, EC 10 Sec, Severe Sway  Condition 3: Foam Surface, EO 20 Sec, Moderate and Severe Sway  Condition 4: Foam Surface, EC 8 Sec, Severe Sway    PATIENT SURVEYS:        GOALS: Goals reviewed with patient? Yes  SHORT TERM GOALS: Target date: 05/01/2022      Patient will be independent in HEP to improve functional outcomes Baseline: Goal status: IN PROGRESS  2.  Demo improved balance and safety with activities such as standing/showering maintaining normal-mild sway per 30 sec condition 2 M-CTSIB Baseline: 10 sec, severe sway/retro LOB Goal status: IN PROGRESS  LONG TERM GOALS: Target date: 05/22/2022    Demo independence with advanced HEP that could include, but not limited to: strength, balance, coordination, posture, and ambulation practices Baseline:  Goal status: IN PROGRESS  2.  Demo 5/5 LLE strength to improve single limb support for improved gait stability Baseline: 4/5 LLE Goal status: IN PROGRESS  3.  Demo improved balance and reduced risk for falls per score 25/30 Functional Gait Assessment Baseline: 16/30 Goal status: IN PROGRESS    ASSESSMENT:  CLINICAL IMPRESSION: Pt reports difficulty with HEP performance. Reviewed activities with visual/verbal reinforcement with good effect and return demonstration. Profound balance deficits with eyes closed and narrow BOS and/or with head movements. Vestibular exam does reveal some irregularities such as some deficits with vertical saccades and smooth pursuit deficits reaffirmed with a positive Head Impulse Test to the right, negative to the left.  Introduced principles of interval training for CV fitness to complement/progress his current activity of using stationary bike at fitness facility.  OBJECTIVE IMPAIRMENTS: Abnormal gait, decreased activity tolerance, decreased balance, decreased coordination, decreased knowledge of use of DME, difficulty walking, decreased  strength, impaired perceived functional ability, and postural dysfunction.   ACTIVITY LIMITATIONS: carrying, lifting, squatting, stairs, transfers, and locomotion level  PARTICIPATION LIMITATIONS: interpersonal relationship, community activity, and yard work  PERSONAL FACTORS: Age, Time since onset of injury/illness/exacerbation, and 1-2 comorbidities: essential tremor, Right TKR  are also affecting patient's functional outcome.   REHAB POTENTIAL: Good  CLINICAL DECISION MAKING: Evolving/moderate complexity  EVALUATION COMPLEXITY: Moderate  PLAN:  PT FREQUENCY: 1x/week  PT DURATION: 6 weeks  PLANNED INTERVENTIONS: Therapeutic exercises, Therapeutic activity, Neuromuscular re-education, Balance training, Gait training, Patient/Family education, Self Care, Joint mobilization, Stair training, Vestibular training, Canalith repositioning, Orthotic/Fit training, DME instructions, Aquatic Therapy, Dry Needling, Electrical stimulation, Spinal mobilization, Cryotherapy, Moist heat, Taping, Ionotophoresis 72m/ml Dexamethasone, and Manual therapy  PLAN FOR NEXT SESSION: HEP review, progress balance activities   9:28 AM, 04/21/22 M. KSherlyn Lees PT, DPT Physical Therapist- CGoliadOffice Number: [phone]

## 2022-04-24 ENCOUNTER — Encounter: Payer: Self-pay | Admitting: Internal Medicine

## 2022-04-24 DIAGNOSIS — R413 Other amnesia: Secondary | ICD-10-CM

## 2022-04-24 DIAGNOSIS — M791 Myalgia, unspecified site: Secondary | ICD-10-CM

## 2022-04-26 ENCOUNTER — Ambulatory Visit: Payer: Medicare Other | Attending: Internal Medicine

## 2022-04-26 ENCOUNTER — Ambulatory Visit: Payer: Medicare Other

## 2022-04-26 DIAGNOSIS — Z9181 History of falling: Secondary | ICD-10-CM | POA: Diagnosis not present

## 2022-04-26 DIAGNOSIS — R2681 Unsteadiness on feet: Secondary | ICD-10-CM | POA: Diagnosis not present

## 2022-04-26 DIAGNOSIS — M791 Myalgia, unspecified site: Secondary | ICD-10-CM

## 2022-04-26 DIAGNOSIS — R293 Abnormal posture: Secondary | ICD-10-CM | POA: Diagnosis not present

## 2022-04-26 DIAGNOSIS — M6281 Muscle weakness (generalized): Secondary | ICD-10-CM

## 2022-04-26 DIAGNOSIS — E785 Hyperlipidemia, unspecified: Secondary | ICD-10-CM

## 2022-04-26 DIAGNOSIS — R413 Other amnesia: Secondary | ICD-10-CM

## 2022-04-26 DIAGNOSIS — G250 Essential tremor: Secondary | ICD-10-CM | POA: Diagnosis not present

## 2022-04-26 DIAGNOSIS — R2689 Other abnormalities of gait and mobility: Secondary | ICD-10-CM | POA: Diagnosis not present

## 2022-04-26 LAB — HEPATIC FUNCTION PANEL
ALT: 14 IU/L (ref 0–44)
AST: 22 IU/L (ref 0–40)
Albumin: 4.3 g/dL (ref 3.8–4.8)
Alkaline Phosphatase: 84 IU/L (ref 44–121)
Bilirubin Total: 0.8 mg/dL (ref 0.0–1.2)
Bilirubin, Direct: 0.22 mg/dL (ref 0.00–0.40)
Total Protein: 6.3 g/dL (ref 6.0–8.5)

## 2022-04-26 LAB — LIPID PANEL
Chol/HDL Ratio: 2.5 ratio (ref 0.0–5.0)
Cholesterol, Total: 138 mg/dL (ref 100–199)
HDL: 55 mg/dL (ref >39)
LDL Chol Calc (NIH): 67 mg/dL (ref 0–99)
Triglycerides: 86 mg/dL (ref 0–149)
VLDL Cholesterol Cal: 16 mg/dL (ref 5–40)

## 2022-04-26 LAB — CK: Total CK: 167 U/L (ref 41–331)

## 2022-04-26 NOTE — Therapy (Signed)
OUTPATIENT PHYSICAL THERAPY NEURO TREATMENT   Patient Name: Cole Martinez MRN: [id number] DOB:1946-03-02, 77 y.o., male Today's Date: 04/26/2022   PCP: Josetta Huddle, MD REFERRING PROVIDER: Suzzanne Cloud, NP  END OF SESSION:  PT End of Session - 04/26/22 0928     Visit Number 3    Number of Visits 6    Date for PT Re-Evaluation 05/22/22    Authorization Type Medicare A&B/ [place]    Progress Note Due on Visit 10    PT Start Time 0930    PT Stop Time [id number]    PT Time Calculation (min) 45 min             Past Medical History:  Diagnosis Date   ADHD (attention deficit hyperactivity disorder)    B12 deficiency    BPH (benign prostatic hypertrophy)    Chronic lymphocytic leukemia (CLL), 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--  ONCOLOGIST-  DR Benay Spice   PT IS ASYMPTOMATIC--- LAST CBC W/ DIFF 06-24-2012 STABLE   Coronary artery disease CARDIOLOGIST- DR Daneen Schick   S/P STENTING LAD 1999 // Myoview 01/2019: EF 62, normal perfusion; Low Risk   Crohn's disease of ileum ([place]) [place]   ED (erectile dysfunction)    Elevated PSA    Essential and other specified forms of tremor 11/25/2012   Gait abnormality 05/17/2020   H/O adenomatous polyp of colon    Hyperlipidemia    Hypertension    Nocturia    OA (osteoarthritis)    Peripheral neuropathy    hx of, none current as of 08-04-13   Peyronie disease    S/P coronary artery stent placement OCT 1999 OF LAD   Tremor, hereditary, benign MILD RIGHT HAND   Past Surgical History:  Procedure Laterality Date   CARDIOVASCULAR STRESS TEST  11-01-2010 DR Daneen Schick   NORMAL PERFUSION STUDY/ EF 64%/ NO ISCHEMIA   cataract surgery  Bilateral feburary 2020   with lens placement    COLONOSCOPY WITH PROPOFOL N/A 09/24/2012   Procedure: COLONOSCOPY WITH PROPOFOL;  Surgeon: [name], MD;  Location: [facility];  Service: Endoscopy;  Laterality: N/A;   CORONARY ANGIOPLASTY WITH STENT PLACEMENT  OCT 1999   STENT OF LAD   CORONARY STENT  INTERVENTION N/A 02/14/2019   Procedure: CORONARY STENT INTERVENTION;  Surgeon: Belva Crome, MD;  Location: [facility];  Service: Cardiovascular;  Laterality: N/A;   CYSTOSCOPY WITH URETHRAL DILATATION  03/12/2017   Procedure: CYSTOSCOPY WITH URETHRAL DILATATION;  Surgeon: Gaynelle Arabian, MD;  Location: [facility];  Service: Orthopedics;;  Ammie Dalton, Resident Assisting   CYSTOSCOPY WITH URETHRAL DILATATION N/A 10/03/2018   Procedure: CYSTOSCOPY WITH URETHRAL BALLOON DILATATION WITH BILATERAL RETROGRADE PYELOGRAPHY;  Surgeon: Raynelle Bring, MD;  Location: [facility];  Service: Urology;  Laterality: N/A;   CYSTOSCOPY WITH URETHRAL DILATATION N/A 12/30/2020   Procedure: CYSTOSCOPY WITH BALLOON DILATATION OF URETHRAL STRICTURE;  Surgeon: Raynelle Bring, MD;  Location: [facility];  Service: Urology;  Laterality: N/A;   LAPAROSCOPIC INGUINAL HERNIA REPAIR Bilateral 01-10-2004   W/ MESH   LEFT HEART CATH AND CORONARY ANGIOGRAPHY N/A 02/13/2019   Procedure: LEFT HEART CATH AND CORONARY ANGIOGRAPHY;  Surgeon: Belva Crome, MD;  Location: [facility];  Service: Cardiovascular;  Laterality: N/A;   neck benign removed from neck  yrs ago   PROSTATE BIOPSY N/A 07/12/2012   Procedure: PROSTATE BIOPSY AND ULTRASOUND;  Surgeon: Ailene Rud, MD;  Location: [facility];  Service: Urology;  Laterality:  N/A;   PROSTATE SURGERY  2002   tuna   REMOVAL LEFT NECK LYMPH NODE  1996   TONSILLECTOMY  CHILD   TOTAL KNEE ARTHROPLASTY Right 03/12/2017   Procedure: RIGHT TOTAL KNEE ARTHROPLASTY;  Surgeon: Gaynelle Arabian, MD;  Location: [facility];  Service: Orthopedics;  Laterality: Right;  Adductor Block   TRANSURETHRAL RESECTION OF PROSTATE N/A 08/08/2013   Procedure: TRANSURETHRAL RESECTION OF THE PROSTATE WITH GYRUS INSTRUMENTS;  Surgeon: Ailene Rud, MD;  Location: [facility];  Service: Urology;  Laterality: N/A;   TRANSURETHRAL RESECTION OF PROSTATE     Patient Active Problem List   Diagnosis  Date Noted   Adult attention deficit disorder 06/04/2020   Chronic lymphocytic leukemia ([place]) 06/04/2020   Conductive hearing loss, bilateral 06/04/2020   Crohn's ileitis ([place]) 06/04/2020   Disorder of musculoskeletal system 06/04/2020   Erectile dysfunction 06/04/2020   Gastroesophageal reflux disease 06/04/2020   Gout 06/04/2020   History of adenomatous polyp of colon 06/04/2020   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   Hypothyroidism 06/04/2020   Increased frequency of urination 06/04/2020   Male hypogonadism 06/04/2020   Muscle pain 06/04/2020   Nocturia 06/04/2020   Obesity 06/04/2020   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   Osteopenia 06/04/2020   Peripheral neurogenic pain 06/04/2020   Pure hypercholesterolemia 06/04/2020   Recurrent falls 06/04/2020   Unspecified kyphosis, thoracic region 06/04/2020   Vitamin B12 deficiency 06/04/2020   Vitamin D deficiency 06/04/2020   Gait abnormality 05/17/2020   Ataxia 03/16/2020   Angina pectoris ([place]) 02/13/2019   Pseudophakia of both eyes 08/01/2018   History of total knee arthroplasty 03/29/2017   Stiffness of right knee 03/16/2017   OA (osteoarthritis) of knee 03/12/2017   Tremor, essential 12/03/2015   Essential hypertension 12/09/2014   Coronary artery disease involving native heart 12/08/2013   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[place]) 12/08/2013   Hyperlipidemia 12/08/2013   Benign prostatic hypertrophy 08/08/2013   Essential and other specified forms of tremor 11/25/2012    ONSET DATE: tremor has been present for years  REFERRING DIAG: R26.89 (ICD-10-CM) - Balance problem G25.0 (ICD-10-CM) - Essential tremor  THERAPY DIAG:  Unsteadiness on feet  Other abnormalities of gait and mobility  Abnormal posture  Muscle weakness (generalized)  History of falling  Rationale for Evaluation and Treatment: Rehabilitation  SUBJECTIVE:                                                                                                                                                                                              SUBJECTIVE STATEMENT: Practicing balance activities at the gym  Pt accompanied by: self  PERTINENT HISTORY: underlying complex medical history of coronary artery disease with status post stenting, hypertension, hyperlipidemia, elevated PSA, urethral stricture, Crohn's disease, CLL, ADHD, vitamin B12 deficiency, , R TKR, osteoarthritis, neuropathy, and tremor,  PAIN:  Are you having pain? No  PRECAUTIONS: Fall  WEIGHT BEARING RESTRICTIONS: No  FALLS: Has patient fallen in last 6 months? No, reports fall 9 months ago, but fearful of falling  LIVING ENVIRONMENT: Lives with: lives with their spouse Lives in: House/apartment Stairs: Yes: Internal: flight steps; on right going up and External: yes steps; can reach both Has following equipment at home: None  PLOF: Independent  PATIENT GOALS: improve balance, upper body strength. Not willing to use AD  OBJECTIVE:   TODAY'S TREATMENT: 04/26/22 Activity Comments  HEP review   M-CTSIB Severe/LOB condition 3-4  Fukuda step test 90 degree turn to the right  Semi-tandem 3x15 sec Lead foot on dynadisc--LOB with eyes closed condition and stepping strategy present/delayed  Retro-walking 4x25 ft   Tandem walk 2x25 ft   Push-release  Posterior and anterior  Gait  training  W/ trekking poles  Supine chin retractions 10x3 sec Improved performance with this position     TODAY'S TREATMENT: 04/21/22 Activity Comments  HEP review -Chin retractions: 10x 3 sec hold--use of mirror for feedback -Corner balance: feet apart EO/EC x 30 sec. Feet together w/ head turns EO/EC 5x  Vestibular exam -see below  Aerobic intervals NU-step: resistance intervals 30 sec high resist, 1 min light. Speed intervals: 15 sec fast; 30 sec light             PATIENT EDUCATION: Education details: assessment findings, rationale for PT intervention, HEP  initiation Person educated: Patient Education method: Explanation, Demonstration, and Handouts Education comprehension: verbalized understanding, returned demonstration, and needs further education  HOME EXERCISE PROGRAM: Access Code: [email or web address] URL: [email or web address] Date: 04/10/2022 Prepared by: Sherlyn Lees  Exercises - Standing Balance in [place]  - 1 x daily - 7 x weekly - 3 sets - 30 sec hold - Standing Balance in [place] with Eyes Closed  - 1 x daily - 7 x weekly - 3 sets - 30 sec hold - Corner Balance Feet Together: Eyes Open With Head Turns  - 1 x daily - 7 x weekly - 3 sets - 3-5 reps - Corner Balance Feet Together: Eyes Closed With Head Turns  - 1 x daily - 7 x weekly - 3 sets - 3-5 reps - Seated Cervical Retraction  - 1 x daily - 7 x weekly - 3 sets - 10 reps - 3-5 sec hold - Standing Cervical Retraction  - 1 x daily - 7 x weekly - 3 sets - 10 reps - Supine Cervical Retraction with Towel  - 1 x daily - 7 x weekly - 1-3 sets - 10 reps - 3-5 sec hold  VESTIBULAR ASSESSMENT   GENERAL OBSERVATION: wears progressive lens    SYMPTOM BEHAVIOR:   Subjective history: poor balance   Non-Vestibular symptoms: changes in hearing   Type of dizziness: Unsteady with head/body turns   Frequency: varies   Duration: varies   Aggravating factors:    Relieving factors: slow movements   Progression of symptoms: insidious   OCULOMOTOR EXAM:   Ocular Alignment: normal    Ocular ROM: No Limitations    Spontaneous Nystagmus: absent    Gaze-Induced Nystagmus: absent    Smooth Pursuits: saccades, more towards right tracking    Saccades: hypometric/undershoots and more in a vertical direction  Convergence/Divergence: 4 cm cm    Horizontal head shaking - negative  Vertical head shaking - induced nystagmus: negative      VESTIBULAR - OCULAR REFLEX:    Slow VOR: Normal   VOR Cancellation: Normal   Head-Impulse Test: HIT Right: positive HIT Left:  negative     Dynamic Visual Acuity: Not able to be assessed      POSITIONAL TESTING: NT due to no c/o positional vertigo     MOTION SENSITIVITY:    Motion Sensitivity Quotient  Intensity: 0 = none, 1 = Lightheaded, 2 = Mild, 3 = Moderate, 4 = Severe, 5 = Vomiting  Intensity  1. Sitting to supine   2. Supine to L side   3. Supine to R side   4. Supine to sitting   5. L Hallpike-Dix   6. Up from L    7. R Hallpike-Dix   8. Up from R    9. Sitting, head  tipped to L knee   10. Head up from L  knee   11. Sitting, head  tipped to R knee   12. Head up from R  knee   13. Sitting head turns x5   14.Sitting head nods x5   15. In stance, 180  turn to L    16. In stance, 180  turn to R     OTHOSTATICS: not done    DIAGNOSTIC FINDINGS:   COGNITION: Overall cognitive status: Within functional limits for tasks assessed   SENSATION: Not tested  COORDINATION:   EDEMA:    MUSCLE TONE: resting and action tremor noted, UE > LE   MUSCLE LENGTH: NT  DTRs:    POSTURE: forward head  LOWER EXTREMITY ROM:     Active  Right Eval Left Eval  Hip flexion    Hip extension    Hip abduction    Hip adduction    Hip internal rotation    Hip external rotation    Knee flexion    Knee extension    Ankle dorsiflexion 10 10  Ankle plantarflexion    Ankle inversion    Ankle eversion     (Blank rows = not tested)  LOWER EXTREMITY MMT:    MMT Right Eval Left Eval  Hip flexion 5 4  Hip extension    Hip abduction 5 4  Hip adduction    Hip internal rotation    Hip external rotation    Knee flexion 5 5  Knee extension 5 4  Ankle dorsiflexion 5 5  Ankle plantarflexion    Ankle inversion    Ankle eversion    (Blank rows = not tested)  BED MOBILITY:  NT  TRANSFERS: Assistive device utilized: None  Sit to stand: Complete Independence Stand to sit: Complete Independence Chair to chair: Complete Independence Floor:  NT  RAMP:  NT  CURB:   NT  STAIRS: Level of Assistance: Modified independence Stair Negotiation Technique: Alternating Pattern  with Single Rail on Right Single Rail on Left Number of Stairs: 15  Height of Stairs: 4-6  Comments:   GAIT: Gait pattern:  unsteady  tremoring presnet Distance walked:  Assistive device utilized: None Level of assistance: Complete Independence and Modified independence Comments: reduced velocity  FUNCTIONAL TESTS:  Timed up and go (TUG): NT 10 meter walk test: 2.66 ft/sec Berg Balance Scale: NT Functional gait assessment: 16/30  M-CTSIB  Condition 1: Firm Surface, EO 30 Sec, Normal and Mild Sway  Condition 2: Firm Surface, EC 10  Sec, Severe Sway  Condition 3: Foam Surface, EO 20 Sec, Moderate and Severe Sway  Condition 4: Foam Surface, EC 8 Sec, Severe Sway    PATIENT SURVEYS:        GOALS: Goals reviewed with patient? Yes  SHORT TERM GOALS: Target date: 05/01/2022      Patient will be independent in HEP to improve functional outcomes Baseline: Goal status: IN PROGRESS  2.  Demo improved balance and safety with activities such as standing/showering maintaining normal-mild sway per 30 sec condition 2 M-CTSIB Baseline: 10 sec, severe sway/retro LOB Goal status: IN PROGRESS   LONG TERM GOALS: Target date: 05/22/2022    Demo independence with advanced HEP that could include, but not limited to: strength, balance, coordination, posture, and ambulation practices Baseline:  Goal status: IN PROGRESS  2.  Demo 5/5 LLE strength to improve single limb support for improved gait stability Baseline: 4/5 LLE Goal status: IN PROGRESS  3.  Demo improved balance and reduced risk for falls per score 25/30 Functional Gait Assessment Baseline: 16/30 Goal status: IN PROGRESS    ASSESSMENT:  CLINICAL IMPRESSION: Tx focus on balance and righting reaction facilitation to improve reactive balance and reduce risk for falls. Demo delayed but present righting reaction  with propensity for stepping strategy via 3-5 small amplitude steps. Improved performance with push-release enacting more single, large amplitude step to stop LOB. Fukuda step test with 90 degree turn to the right during activity. Improved postural correction via supine position with pillow support. Continued sessions to advance balance and ait activities to reduce risk for falls  OBJECTIVE IMPAIRMENTS: Abnormal gait, decreased activity tolerance, decreased balance, decreased coordination, decreased knowledge of use of DME, difficulty walking, decreased strength, impaired perceived functional ability, and postural dysfunction.   ACTIVITY LIMITATIONS: carrying, lifting, squatting, stairs, transfers, and locomotion level  PARTICIPATION LIMITATIONS: interpersonal relationship, community activity, and yard work  PERSONAL FACTORS: Age, Time since onset of injury/illness/exacerbation, and 1-2 comorbidities: essential tremor, Right TKR  are also affecting patient's functional outcome.   REHAB POTENTIAL: Good  CLINICAL DECISION MAKING: Evolving/moderate complexity  EVALUATION COMPLEXITY: Moderate  PLAN:  PT FREQUENCY: 1x/week  PT DURATION: 6 weeks  PLANNED INTERVENTIONS: Therapeutic exercises, Therapeutic activity, Neuromuscular re-education, Balance training, Gait training, Patient/Family education, Self Care, Joint mobilization, Stair training, Vestibular training, Canalith repositioning, Orthotic/Fit training, DME instructions, Aquatic Therapy, Dry Needling, Electrical stimulation, Spinal mobilization, Cryotherapy, Moist heat, Taping, Ionotophoresis 66m/ml Dexamethasone, and Manual therapy  PLAN FOR NEXT SESSION: HEP review, progress balance activities   9:31 AM, 04/26/22 M. KSherlyn Lees PT, DPT Physical Therapist- CEarthOffice Number: [phone]

## 2022-05-02 ENCOUNTER — Telehealth: Payer: Self-pay | Admitting: *Deleted

## 2022-05-02 DIAGNOSIS — E785 Hyperlipidemia, unspecified: Secondary | ICD-10-CM

## 2022-05-02 NOTE — Telephone Encounter (Signed)
-----   Message from Early Osmond, MD sent at 04/26/2022  4:51 PM EST ----- He is having concerns regarding his statin, let's have him see pharmD to discuss.  Please schedule him for follow up with me in 5-6 months.

## 2022-05-02 NOTE — Telephone Encounter (Signed)
Called patient and reviewed results and recommendation to see PharmD.  He has continued Lipitor as of now.  He said the muscle aches are not bad but the memory issue is concerning to him.  Scheduled w Pharm D next week.

## 2022-05-03 ENCOUNTER — Ambulatory Visit: Payer: Medicare Other

## 2022-05-03 DIAGNOSIS — R2689 Other abnormalities of gait and mobility: Secondary | ICD-10-CM | POA: Diagnosis not present

## 2022-05-03 DIAGNOSIS — R293 Abnormal posture: Secondary | ICD-10-CM

## 2022-05-03 DIAGNOSIS — R2681 Unsteadiness on feet: Secondary | ICD-10-CM

## 2022-05-03 DIAGNOSIS — M6281 Muscle weakness (generalized): Secondary | ICD-10-CM | POA: Diagnosis not present

## 2022-05-03 DIAGNOSIS — G250 Essential tremor: Secondary | ICD-10-CM | POA: Diagnosis not present

## 2022-05-03 DIAGNOSIS — Z9181 History of falling: Secondary | ICD-10-CM | POA: Diagnosis not present

## 2022-05-03 NOTE — Therapy (Signed)
OUTPATIENT PHYSICAL THERAPY NEURO TREATMENT   Patient Name: Cole Martinez MRN: [id number] DOB:[birth year], 77 y.o., male Today's Date: 05/03/2022   PCP: Josetta Huddle, MD REFERRING PROVIDER: Suzzanne Cloud, NP  END OF SESSION:  PT End of Session - 05/03/22 0933     Visit Number 4    Number of Visits 6    Date for PT Re-Evaluation 05/22/22    Authorization Type Medicare A&B/ [place]    Progress Note Due on Visit 10    PT Start Time 0930    PT Stop Time [id number]    PT Time Calculation (min) 45 min             Past Medical History:  Diagnosis Date   ADHD (attention deficit hyperactivity disorder)    B12 deficiency    BPH (benign prostatic hypertrophy)    Chronic lymphocytic leukemia (CLL), T-cell ([place]) [place]--  ONCOLOGIST-  DR Benay Spice   PT IS ASYMPTOMATIC--- LAST CBC W/ DIFF 06-24-2012 STABLE   Coronary artery disease CARDIOLOGIST- DR Daneen Schick   S/P STENTING LAD 1999 // Myoview 01/2019: EF 62, normal perfusion; Low Risk   Crohn's disease of ileum ([place]) [place]   ED (erectile dysfunction)    Elevated PSA    Essential and other specified forms of tremor 11/25/2012   Gait abnormality 05/17/2020   H/O adenomatous polyp of colon    Hyperlipidemia    Hypertension    Nocturia    OA (osteoarthritis)    Peripheral neuropathy    hx of, none current as of 08-04-13   Peyronie disease    S/P coronary artery stent placement OCT 1999 OF LAD   Tremor, hereditary, benign MILD RIGHT HAND   Past Surgical History:  Procedure Laterality Date   CARDIOVASCULAR STRESS TEST  11-01-2010 DR Daneen Schick   NORMAL PERFUSION STUDY/ EF 64%/ NO ISCHEMIA   cataract surgery  Bilateral feburary 2020   with lens placement    COLONOSCOPY WITH PROPOFOL N/A 09/24/2012   Procedure: COLONOSCOPY WITH PROPOFOL;  Surgeon: [name], MD;  Location: [facility];  Service: Endoscopy;  Laterality: N/A;   CORONARY ANGIOPLASTY WITH STENT PLACEMENT  OCT 1999   STENT OF LAD   CORONARY STENT  INTERVENTION N/A 02/14/2019   Procedure: CORONARY STENT INTERVENTION;  Surgeon: Belva Crome, MD;  Location: [facility];  Service: Cardiovascular;  Laterality: N/A;   CYSTOSCOPY WITH URETHRAL DILATATION  03/12/2017   Procedure: CYSTOSCOPY WITH URETHRAL DILATATION;  Surgeon: Gaynelle Arabian, MD;  Location: [facility];  Service: Orthopedics;;  Ammie Dalton, Resident Assisting   CYSTOSCOPY WITH URETHRAL DILATATION N/A 10/03/2018   Procedure: CYSTOSCOPY WITH URETHRAL BALLOON DILATATION WITH BILATERAL RETROGRADE PYELOGRAPHY;  Surgeon: Raynelle Bring, MD;  Location: [facility];  Service: Urology;  Laterality: N/A;   CYSTOSCOPY WITH URETHRAL DILATATION N/A 12/30/2020   Procedure: CYSTOSCOPY WITH BALLOON DILATATION OF URETHRAL STRICTURE;  Surgeon: Raynelle Bring, MD;  Location: [facility];  Service: Urology;  Laterality: N/A;   LAPAROSCOPIC INGUINAL HERNIA REPAIR Bilateral 01-10-2004   W/ MESH   LEFT HEART CATH AND CORONARY ANGIOGRAPHY N/A 02/13/2019   Procedure: LEFT HEART CATH AND CORONARY ANGIOGRAPHY;  Surgeon: Belva Crome, MD;  Location: [facility];  Service: Cardiovascular;  Laterality: N/A;   neck benign removed from neck  yrs ago   PROSTATE BIOPSY N/A 07/12/2012   Procedure: PROSTATE BIOPSY AND ULTRASOUND;  Surgeon: Ailene Rud, MD;  Location[facility];  Service: Urology;  Laterality:  N/A;   PROSTATE SURGERY  2002   tuna   REMOVAL LEFT NECK LYMPH NODE  1996   TONSILLECTOMY  CHILD   TOTAL KNEE ARTHROPLASTY Right 03/12/2017   Procedure: RIGHT TOTAL KNEE ARTHROPLASTY;  Surgeon: Gaynelle Arabian, MD;  Location: [facility];  Service: Orthopedics;  Laterality: Right;  Adductor Block   TRANSURETHRAL RESECTION OF PROSTATE N/A 08/08/2013   Procedure: TRANSURETHRAL RESECTION OF THE PROSTATE WITH GYRUS INSTRUMENTS;  Surgeon: Ailene Rud, MD;  Location: [facility];  Service: Urology;  Laterality: N/A;   TRANSURETHRAL RESECTION OF PROSTATE     Patient Active Problem List   Diagnosis  Date Noted   Adult attention deficit disorder 06/04/2020   Chronic lymphocytic leukemia ([place]) 06/04/2020   Conductive hearing loss, bilateral 06/04/2020   Crohn's ileitis ([place]) 06/04/2020   Disorder of musculoskeletal system 06/04/2020   Erectile dysfunction 06/04/2020   Gastroesophageal reflux disease 06/04/2020   Gout 06/04/2020   History of adenomatous polyp of colon 06/04/2020   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[place]) 06/04/2020   Hypothyroidism 06/04/2020   Increased frequency of urination 06/04/2020   Male hypogonadism 06/04/2020   Muscle pain 06/04/2020   Nocturia 06/04/2020   Obesity 06/04/2020   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   Osteopenia 06/04/2020   Peripheral neurogenic pain 06/04/2020   Pure hypercholesterolemia 06/04/2020   Recurrent falls 06/04/2020   Unspecified kyphosis, thoracic region 06/04/2020   Vitamin B12 deficiency 06/04/2020   Vitamin D deficiency 06/04/2020   Gait abnormality 05/17/2020   Ataxia 03/16/2020   Angina pectoris ([place]) 02/13/2019   Pseudophakia of both eyes 08/01/2018   History of total knee arthroplasty 03/29/2017   Stiffness of right knee 03/16/2017   OA (osteoarthritis) of knee 03/12/2017   Tremor, essential 12/03/2015   Essential hypertension 12/09/2014   Coronary artery disease involving native heart 12/08/2013   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[place]) 12/08/2013   Hyperlipidemia 12/08/2013   Benign prostatic hypertrophy 08/08/2013   Essential and other specified forms of tremor 11/25/2012    ONSET DATE: tremor has been present for years  REFERRING DIAG: R26.89 (ICD-10-CM) - Balance problem G25.0 (ICD-10-CM) - Essential tremor  THERAPY DIAG:  Unsteadiness on feet  Other abnormalities of gait and mobility  Abnormal posture  Muscle weakness (generalized)  Rationale for Evaluation and Treatment: Rehabilitation  SUBJECTIVE:                                                                                                                                                                                              SUBJECTIVE STATEMENT: Practicing balance activities at the gym  Pt accompanied by: self  PERTINENT HISTORY: underlying  complex medical history of coronary artery disease with status post stenting, hypertension, hyperlipidemia, elevated PSA, urethral stricture, Crohn's disease, CLL, ADHD, vitamin B12 deficiency, , R TKR, osteoarthritis, neuropathy, and tremor,  PAIN:  Are you having pain? No  PRECAUTIONS: Fall  WEIGHT BEARING RESTRICTIONS: No  FALLS: Has patient fallen in last 6 months? No, reports fall 9 months ago, but fearful of falling  LIVING ENVIRONMENT: Lives with: lives with their spouse Lives in: House/apartment Stairs: Yes: Internal: flight steps; on right going up and External: yes steps; can reach both Has following equipment at home: None  PLOF: Independent  PATIENT GOALS: improve balance, upper body strength. Not willing to use AD  OBJECTIVE:   TODAY'S TREATMENT: 05/03/22 Activity Comments  Dynamic balance warm-up Multi-directional walking 4x25 ft  4-square stepping   Fig 8 walking Attempted with motor dual-tasking by bouncing ball  Seated lat row 1x10 20, 30, 40#  Seated row 2x15 30#  Pt education Resistance training and essential tremor--demo PubMed and its use    TODAY'S TREATMENT: 04/26/22 Activity Comments  HEP review   M-CTSIB Severe/LOB condition 3-4  Fukuda step test 90 degree turn to the right  Semi-tandem 3x15 sec Lead foot on dynadisc--LOB with eyes closed condition and stepping strategy present/delayed  Retro-walking 4x25 ft   Tandem walk 2x25 ft   Push-release  Posterior and anterior  Gait  training  W/ trekking poles  Supine chin retractions 10x3 sec Improved performance with this position       PATIENT EDUCATION: Education details: assessment findings, rationale for PT intervention, HEP initiation Person educated: Patient Education method:  Consulting civil engineer, Demonstration, and Handouts Education comprehension: verbalized understanding, returned demonstration, and needs further education  HOME EXERCISE PROGRAM: Access Code: [email or web address] URL: [email or web address] Date: 04/10/2022 Prepared by: Sherlyn Lees  Exercises - Standing Balance in Corner  - 1 x daily - 7 x weekly - 3 sets - 30 sec hold - Standing Balance in Corner with Eyes Closed  - 1 x daily - 7 x weekly - 3 sets - 30 sec hold - Corner Balance Feet Together: Eyes Open With Head Turns  - 1 x daily - 7 x weekly - 3 sets - 3-5 reps - Corner Balance Feet Together: Eyes Closed With Head Turns  - 1 x daily - 7 x weekly - 3 sets - 3-5 reps - Seated Cervical Retraction  - 1 x daily - 7 x weekly - 3 sets - 10 reps - 3-5 sec hold - Standing Cervical Retraction  - 1 x daily - 7 x weekly - 3 sets - 10 reps - Supine Cervical Retraction with Towel  - 1 x daily - 7 x weekly - 1-3 sets - 10 reps - 3-5 sec hold - Lat Pull Down  - 1 x daily - 7 x weekly - 3 sets - 10 reps - Seated Row Cable Machine  - 1 x daily - 7 x weekly - 3 sets - 10 reps  VESTIBULAR ASSESSMENT   GENERAL OBSERVATION: wears progressive lens    SYMPTOM BEHAVIOR:   Subjective history: poor balance   Non-Vestibular symptoms: changes in hearing   Type of dizziness: Unsteady with head/body turns   Frequency: varies   Duration: varies   Aggravating factors:    Relieving factors: slow movements   Progression of symptoms: insidious   OCULOMOTOR EXAM:   Ocular Alignment: normal    Ocular ROM: No Limitations    Spontaneous Nystagmus: absent    Gaze-Induced Nystagmus: absent  Smooth Pursuits: saccades, more towards right tracking    Saccades: hypometric/undershoots and more in a vertical direction     Convergence/Divergence: 4 cm cm    Horizontal head shaking - negative  Vertical head shaking - induced nystagmus: negative      VESTIBULAR - OCULAR REFLEX:    Slow VOR: Normal   VOR  Cancellation: Normal   Head-Impulse Test: HIT Right: positive HIT Left: negative     Dynamic Visual Acuity: Not able to be assessed      POSITIONAL TESTING: NT due to no c/o positional vertigo     MOTION SENSITIVITY:    Motion Sensitivity Quotient  Intensity: 0 = none, 1 = Lightheaded, 2 = Mild, 3 = Moderate, 4 = Severe, 5 = Vomiting  Intensity  1. Sitting to supine   2. Supine to L side   3. Supine to R side   4. Supine to sitting   5. L Hallpike-Dix   6. Up from L    7. R Hallpike-Dix   8. Up from R    9. Sitting, head  tipped to L knee   10. Head up from L  knee   11. Sitting, head  tipped to R knee   12. Head up from R  knee   13. Sitting head turns x5   14.Sitting head nods x5   15. In stance, 180  turn to L    16. In stance, 180  turn to R     OTHOSTATICS: not done    DIAGNOSTIC FINDINGS:   COGNITION: Overall cognitive status: Within functional limits for tasks assessed   SENSATION: Not tested  COORDINATION:   EDEMA:    MUSCLE TONE: resting and action tremor noted, UE > LE   MUSCLE LENGTH: NT  DTRs:    POSTURE: forward head  LOWER EXTREMITY ROM:     Active  Right Eval Left Eval  Hip flexion    Hip extension    Hip abduction    Hip adduction    Hip internal rotation    Hip external rotation    Knee flexion    Knee extension    Ankle dorsiflexion 10 10  Ankle plantarflexion    Ankle inversion    Ankle eversion     (Blank rows = not tested)  LOWER EXTREMITY MMT:    MMT Right Eval Left Eval  Hip flexion 5 4  Hip extension    Hip abduction 5 4  Hip adduction    Hip internal rotation    Hip external rotation    Knee flexion 5 5  Knee extension 5 4  Ankle dorsiflexion 5 5  Ankle plantarflexion    Ankle inversion    Ankle eversion    (Blank rows = not tested)  BED MOBILITY:  NT  TRANSFERS: Assistive device utilized: None  Sit to stand: Complete Independence Stand to sit: Complete Independence Chair to  chair: Complete Independence Floor:  NT  RAMP:  NT  CURB:  NT  STAIRS: Level of Assistance: Modified independence Stair Negotiation Technique: Alternating Pattern  with Single Rail on Right Single Rail on Left Number of Stairs: 15  Height of Stairs: 4-6  Comments:   GAIT: Gait pattern:  unsteady  tremoring presnet Distance walked:  Assistive device utilized: None Level of assistance: Complete Independence and Modified independence Comments: reduced velocity  FUNCTIONAL TESTS:  Timed up and go (TUG): NT 10 meter walk test: 2.66 ft/sec Berg Balance Scale: NT Functional gait assessment:  16/30  M-CTSIB  Condition 1: Firm Surface, EO 30 Sec, Normal and Mild Sway  Condition 2: Firm Surface, EC 10 Sec, Severe Sway  Condition 3: Foam Surface, EO 20 Sec, Moderate and Severe Sway  Condition 4: Foam Surface, EC 8 Sec, Severe Sway    PATIENT SURVEYS:        GOALS: Goals reviewed with patient? Yes  SHORT TERM GOALS: Target date: 05/01/2022      Patient will be independent in HEP to improve functional outcomes Baseline: Goal status: IN PROGRESS  2.  Demo improved balance and safety with activities such as standing/showering maintaining normal-mild sway per 30 sec condition 2 M-CTSIB Baseline: 10 sec, severe sway/retro LOB Goal status: IN PROGRESS   LONG TERM GOALS: Target date: 05/22/2022    Demo independence with advanced HEP that could include, but not limited to: strength, balance, coordination, posture, and ambulation practices Baseline:  Goal status: IN PROGRESS  2.  Demo 5/5 LLE strength to improve single limb support for improved gait stability Baseline: 4/5 LLE Goal status: IN PROGRESS  3.  Demo improved balance and reduced risk for falls per score 25/30 Functional Gait Assessment Baseline: 16/30 Goal status: IN PROGRESS    ASSESSMENT:  CLINICAL IMPRESSION: Focus on dynamic balance, gait, single limb stance/support and ability to clear obstacles  and busy environment to reduce risk for falls. Difficulty with requisite hip extension and single limb support for stepping backwards over obstacles. Cotinued sessions to meet POC requirements and improve dual-tasking coordination/capabilities  OBJECTIVE IMPAIRMENTS: Abnormal gait, decreased activity tolerance, decreased balance, decreased coordination, decreased knowledge of use of DME, difficulty walking, decreased strength, impaired perceived functional ability, and postural dysfunction.   ACTIVITY LIMITATIONS: carrying, lifting, squatting, stairs, transfers, and locomotion level  PARTICIPATION LIMITATIONS: interpersonal relationship, community activity, and yard work  PERSONAL FACTORS: Age, Time since onset of injury/illness/exacerbation, and 1-2 comorbidities: essential tremor, Right TKR  are also affecting patient's functional outcome.   REHAB POTENTIAL: Good  CLINICAL DECISION MAKING: Evolving/moderate complexity  EVALUATION COMPLEXITY: Moderate  PLAN:  PT FREQUENCY: 1x/week  PT DURATION: 6 weeks  PLANNED INTERVENTIONS: Therapeutic exercises, Therapeutic activity, Neuromuscular re-education, Balance training, Gait training, Patient/Family education, Self Care, Joint mobilization, Stair training, Vestibular training, Canalith repositioning, Orthotic/Fit training, DME instructions, Aquatic Therapy, Dry Needling, Electrical stimulation, Spinal mobilization, Cryotherapy, Moist heat, Taping, Ionotophoresis '4mg'$ /ml Dexamethasone, and Manual therapy  PLAN FOR NEXT SESSION: HEP review, progress balance activities   9:34 AM, 05/03/22 M. Sherlyn Lees, PT, DPT Physical Therapist- [place] Office Number: [phone]

## 2022-05-09 ENCOUNTER — Ambulatory Visit: Payer: Medicare Other | Attending: Internal Medicine | Admitting: Pharmacist

## 2022-05-09 DIAGNOSIS — E782 Mixed hyperlipidemia: Secondary | ICD-10-CM | POA: Insufficient documentation

## 2022-05-09 DIAGNOSIS — I25119 Atherosclerotic heart disease of native coronary artery with unspecified angina pectoris: Secondary | ICD-10-CM | POA: Insufficient documentation

## 2022-05-09 MED ORDER — ROSUVASTATIN CALCIUM 20 MG PO TABS: 20.0000 mg | ORAL_TABLET | Freq: Every day | ORAL | 3 refills | Status: DC

## 2022-05-09 NOTE — Assessment & Plan Note (Signed)
Assessment: Patient experiencing some memory issues and feels as though it could be from his atorvastatin Son recommended he stop drinking alcohol and he wanted my opinion on this. He drinks 2 shots of vodka per night. Advised it wouldn't be a bad idea to cut back. Exercises regularly and is doing physical therapy  Plan: Stop atorvastatin for 1 week to see if he notices a difference After 1 week start rosuvastatin '20mg'$  daily Repeat labs in 9 weeks

## 2022-05-09 NOTE — Patient Instructions (Addendum)
Stop atorvastatin for 1 week  After 1 week, start rosuvastatin '20mg'$  daily Cut back on alcohol Labs on May 8th. You can come anytime between 7:15 AM -4:30 PM.

## 2022-05-09 NOTE — Assessment & Plan Note (Signed)
>>  ASSESSMENT AND PLAN FOR HYPERLIPIDEMIA WRITTEN ON 05/09/2022  3:44 PM BY MACCIA, MELISSA D, RPH-CPP  Assessment: Patient experiencing some memory issues and feels as though it could be from his atorvastatin Son recommended he stop drinking alcohol and he wanted my opinion on this. He drinks 2 shots of vodka per night. Advised it wouldn't be a bad idea to cut back. Exercises regularly and is doing physical therapy  Plan: Stop atorvastatin for 1 week to see if he notices a difference After 1 week start rosuvastatin 20mg  daily Repeat labs in 9 weeks

## 2022-05-09 NOTE — Progress Notes (Signed)
Patient ID: OBSIDIAN NGUYENTHI                 DOB: Dec 11, 1945                    MRN: [id number]      HPI: Cole Martinez is a 77 y.o. male patient referred to lipid clinic by  Dr. Ali Lowe. PMH is significant for CAD, status post stenting of LAD 1999 and PCI of RCA 2020; with residual 50 to 70% in-stent restenosis of LAD stent with no objective evidence of angina this was treated medically, HLD and HTN. Most recent LDL-C is 67 on atorvastatin '40mg'$  daily. However, patient has concerns about memory and muscle aches. LPa only minimally elevated at 98. CK normal.  Patient presents today to lipid clinic. He states that he feels like his atorvastatin has caused his memory to worsen and would like my opinion on this. Also has a tremor which he does not feel like it is better on primidone, only sleeps better. No muscle aches on atorvastatin, only the first few days.   We did review the literature on statins and memory. No conclusive evidence. Atorvastatin would be more likely to cause memory issues due to it being lipophilic. Has not been on any other statin.  Current Medications: atorvastatin '40mg'$  daily  Intolerances: none Risk Factors: CAD, HTN LDL-C goal: <70 (could consider <55, LPa elevated but not >125)  Diet: not discussed  Exercise: recumbent bike, some weights at gym 4 days a week, walks dog daily  Family History:  Family History  Problem Relation Age of Onset   Obesity Brother    Diabetes Brother    Parkinsonism Brother     Social History: 2 shots of voka in tonic water per night, no tobacco  Labs: Lipid Panel     Component Value Date/Time   CHOL 138 04/26/2022 0823   TRIG 86 04/26/2022 0823   HDL 55 04/26/2022 0823   CHOLHDL 2.5 04/26/2022 0823   CHOLHDL 3.3 12/14/2015 0806   VLDL 24 12/14/2015 0806   LDLCALC 67 04/26/2022 0823   LABVLDL 16 04/26/2022 0823    Past Medical History:  Diagnosis Date   ADHD (attention deficit hyperactivity disorder)    B12 deficiency     BPH (benign prostatic hypertrophy)    Chronic lymphocytic leukemia (CLL), T-cell ([place]) DX 1996--  ONCOLOGIST-  DR Benay Spice   PT IS ASYMPTOMATIC--- LAST CBC W/ DIFF 06-24-2012 STABLE   Coronary artery disease CARDIOLOGIST- DR Daneen Schick   S/P STENTING LAD 1999 // Myoview 01/2019: EF 62, normal perfusion; Low Risk   Crohn's disease of ileum ([place]) [place]   ED (erectile dysfunction)    Elevated PSA    Essential and other specified forms of tremor 11/25/2012   Gait abnormality 05/17/2020   H/O adenomatous polyp of colon    Hyperlipidemia    Hypertension    Nocturia    OA (osteoarthritis)    Peripheral neuropathy    hx of, none current as of 08-04-13   Peyronie disease    S/P coronary artery stent placement OCT 1999 OF LAD   Tremor, hereditary, benign MILD RIGHT HAND    Current Outpatient Medications on File Prior to Visit  Medication Sig Dispense Refill   acetaminophen (TYLENOL) 500 MG tablet Take 1,000 mg by mouth at bedtime as needed for mild pain (joint pain).      amphetamine-dextroamphetamine (ADDERALL) 5 MG tablet Take 1 tablet (5 mg total) by  mouth 2 (two) times daily. 60 tablet 0   aspirin EC 81 MG tablet Take 1 tablet (81 mg total) by mouth daily. Swallow whole. 90 tablet 3   atorvastatin (LIPITOR) 40 MG tablet Take 1 tablet (40 mg total) by mouth daily. 90 tablet 3   calcium citrate (CALCITRATE - DOSED IN MG ELEMENTAL CALCIUM) 950 (200 Ca) MG tablet Take 200 mg of elemental calcium by mouth daily.     Cholecalciferol 25 MCG (1000 UT) tablet Take 1,000 Units by mouth daily.     Clobetasol Prop Emollient Base (CLOBETASOL PROPIONATE E) 0.05 % emollient cream Apply 1 application topically 2 (two) times daily. 30 g 1   Coenzyme Q10 (CO Q-10) 200 MG CAPS Take 200 mg by mouth daily.      Cyanocobalamin (VITAMIN B-12) 2500 MCG SUBL Take 2,000 mcg by mouth daily. Reports taking 2,000 mcg capsule     diclofenac Sodium (VOLTAREN) 1 % GEL Apply 1 application topically 4 (four) times  daily as needed (pain).     famotidine (PEPCID) 20 MG tablet Take 20 mg by mouth every morning.     metoprolol succinate (TOPROL-XL) 25 MG 24 hr tablet TAKE 1 TABLET BY MOUTH ONCE DAILY 90 tablet 3   pantoprazole (PROTONIX) 40 MG tablet TAKE 1 TABLET BY MOUTH  DAILY 90 tablet 3   primidone (MYSOLINE) 50 MG tablet Take 1.5 tablets (75 mg total) by mouth at bedtime. 135 tablet 3   No current facility-administered medications on file prior to visit.    No Known Allergies  Assessment/Plan:  1. Hyperlipidemia -  No problem-specific Assessment & Plan notes found for this encounter.    Thank you,  Ramond Dial, Pharm.D, BCPS, CPP [facility] [address], [place], [place] [address]  Phone: [phone]; Fax: [phone]

## 2022-05-10 ENCOUNTER — Ambulatory Visit: Payer: Medicare Other | Attending: Neurology

## 2022-05-10 DIAGNOSIS — Z9181 History of falling: Secondary | ICD-10-CM | POA: Diagnosis not present

## 2022-05-10 DIAGNOSIS — M6281 Muscle weakness (generalized): Secondary | ICD-10-CM | POA: Diagnosis not present

## 2022-05-10 DIAGNOSIS — R293 Abnormal posture: Secondary | ICD-10-CM | POA: Insufficient documentation

## 2022-05-10 DIAGNOSIS — R2689 Other abnormalities of gait and mobility: Secondary | ICD-10-CM | POA: Diagnosis not present

## 2022-05-10 DIAGNOSIS — R2681 Unsteadiness on feet: Secondary | ICD-10-CM | POA: Insufficient documentation

## 2022-05-10 NOTE — Therapy (Signed)
OUTPATIENT PHYSICAL THERAPY NEURO TREATMENT   Patient Name: Cole Martinez MRN: [id number] DOB:03-20-1945, 77 y.o., male Today's Date: 05/03/2022   PCP: Josetta Huddle, MD REFERRING PROVIDER: Suzzanne Cloud, NP  END OF SESSION:  PT End of Session - 05/03/22 0933     Visit Number 4    Number of Visits 6    Date for PT Re-Evaluation 05/22/22    Authorization Type Medicare A&B/ [place]    Progress Note Due on Visit 10    PT Start Time 0930    PT Stop Time [id number]    PT Time Calculation (min) 45 min             Past Medical History:  Diagnosis Date   ADHD (attention deficit hyperactivity disorder)    B12 deficiency    BPH (benign prostatic hypertrophy)    Chronic lymphocytic leukemia (CLL), T-cell ([place]) [place]--  ONCOLOGIST-  DR Benay Spice   PT IS ASYMPTOMATIC--- LAST CBC W/ DIFF 06-24-2012 STABLE   Coronary artery disease CARDIOLOGIST- DR Daneen Schick   S/P STENTING LAD 1999 // Myoview 01/2019: EF 62, normal perfusion; Low Risk   Crohn's disease of ileum ([place]) [place]   ED (erectile dysfunction)    Elevated PSA    Essential and other specified forms of tremor 11/25/2012   Gait abnormality 05/17/2020   H/O adenomatous polyp of colon    Hyperlipidemia    Hypertension    Nocturia    OA (osteoarthritis)    Peripheral neuropathy    hx of, none current as of 08-04-13   Peyronie disease    S/P coronary artery stent placement OCT 1999 OF LAD   Tremor, hereditary, benign MILD RIGHT HAND   Past Surgical History:  Procedure Laterality Date   CARDIOVASCULAR STRESS TEST  11-01-2010 DR Daneen Schick   NORMAL PERFUSION STUDY/ EF 64%/ NO ISCHEMIA   cataract surgery  Bilateral feburary 2020   with lens placement    COLONOSCOPY WITH PROPOFOL N/A 09/24/2012   Procedure: COLONOSCOPY WITH PROPOFOL;  Surgeon: [name], MD;  Location: [facility];  Service: Endoscopy;  Laterality: N/A;   CORONARY ANGIOPLASTY WITH STENT PLACEMENT  OCT 1999   STENT OF LAD   CORONARY STENT  INTERVENTION N/A 02/14/2019   Procedure: CORONARY STENT INTERVENTION;  Surgeon: Belva Crome, MD;  Location: [place] [facility];  Service: Cardiovascular;  Laterality: N/A;   CYSTOSCOPY WITH URETHRAL DILATATION  03/12/2017   Procedure: CYSTOSCOPY WITH URETHRAL DILATATION;  Surgeon: Gaynelle Arabian, MD;  Location: [facility];  Service: Orthopedics;;  Ammie Dalton, Resident Assisting   CYSTOSCOPY WITH URETHRAL DILATATION N/A 10/03/2018   Procedure: CYSTOSCOPY WITH URETHRAL BALLOON DILATATION WITH BILATERAL RETROGRADE PYELOGRAPHY;  Surgeon: Raynelle Bring, MD;  Location: [facility];  Service: Urology;  Laterality: N/A;   CYSTOSCOPY WITH URETHRAL DILATATION N/A 12/30/2020   Procedure: CYSTOSCOPY WITH BALLOON DILATATION OF URETHRAL STRICTURE;  Surgeon: Raynelle Bring, MD;  Location: [facility];  Service: Urology;  Laterality: N/A;   LAPAROSCOPIC INGUINAL HERNIA REPAIR Bilateral 01-10-2004   W/ MESH   LEFT HEART CATH AND CORONARY ANGIOGRAPHY N/A 02/13/2019   Procedure: LEFT HEART CATH AND CORONARY ANGIOGRAPHY;  Surgeon: Belva Crome, MD;  Location: [facility];  Service: Cardiovascular;  Laterality: N/A;   neck benign removed from neck  yrs ago   PROSTATE BIOPSY N/A 07/12/2012   Procedure: PROSTATE BIOPSY AND ULTRASOUND;  Surgeon: Ailene Rud, MD;  Location: [facility];  Service: Urology;  Laterality:  N/A;   PROSTATE SURGERY  2002   tuna   REMOVAL LEFT NECK LYMPH NODE  1996   TONSILLECTOMY  CHILD   TOTAL KNEE ARTHROPLASTY Right 03/12/2017   Procedure: RIGHT TOTAL KNEE ARTHROPLASTY;  Surgeon: Gaynelle Arabian, MD;  Location: [facility];  Service: Orthopedics;  Laterality: Right;  Adductor Block   TRANSURETHRAL RESECTION OF PROSTATE N/A 08/08/2013   Procedure: TRANSURETHRAL RESECTION OF THE PROSTATE WITH GYRUS INSTRUMENTS;  Surgeon: Ailene Rud, MD;  Location: [facility];  Service: Urology;  Laterality: N/A;   TRANSURETHRAL RESECTION OF PROSTATE     Patient Active Problem List   Diagnosis  Date Noted   Adult attention deficit disorder 06/04/2020   Chronic lymphocytic leukemia ([place]) 06/04/2020   Conductive hearing loss, bilateral 06/04/2020   Crohn's ileitis ([employer]) 06/04/2020   Disorder of musculoskeletal system 06/04/2020   Erectile dysfunction 06/04/2020   Gastroesophageal reflux disease 06/04/2020   Gout 06/04/2020   History of adenomatous polyp of colon 06/04/2020   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[place]) 06/04/2020   Hypothyroidism 06/04/2020   Increased frequency of urination 06/04/2020   Male hypogonadism 06/04/2020   Muscle pain 06/04/2020   Nocturia 06/04/2020   Obesity 06/04/2020   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   Osteopenia 06/04/2020   Peripheral neurogenic pain 06/04/2020   Pure hypercholesterolemia 06/04/2020   Recurrent falls 06/04/2020   Unspecified kyphosis, thoracic region 06/04/2020   Vitamin B12 deficiency 06/04/2020   Vitamin D deficiency 06/04/2020   Gait abnormality 05/17/2020   Ataxia 03/16/2020   Angina pectoris ([place]) 02/13/2019   Pseudophakia of both eyes 08/01/2018   History of total knee arthroplasty 03/29/2017   Stiffness of right knee 03/16/2017   OA (osteoarthritis) of knee 03/12/2017   Tremor, essential 12/03/2015   Essential hypertension 12/09/2014   Coronary artery disease involving native heart 12/08/2013   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[place]) 12/08/2013   Hyperlipidemia 12/08/2013   Benign prostatic hypertrophy 08/08/2013   Essential and other specified forms of tremor 11/25/2012    ONSET DATE: tremor has been present for years  REFERRING DIAG: R26.89 (ICD-10-CM) - Balance problem G25.0 (ICD-10-CM) - Essential tremor  THERAPY DIAG:  Unsteadiness on feet  Other abnormalities of gait and mobility  Abnormal posture  Muscle weakness (generalized)  Rationale for Evaluation and Treatment: Rehabilitation  SUBJECTIVE:                                                                                                                                                                                              SUBJECTIVE STATEMENT: Practicing balance activities at the gym  Pt accompanied by: self  PERTINENT HISTORY: underlying  complex medical history of coronary artery disease with status post stenting, hypertension, hyperlipidemia, elevated PSA, urethral stricture, Crohn's disease, CLL, ADHD, vitamin B12 deficiency, , R TKR, osteoarthritis, neuropathy, and tremor,  PAIN:  Are you having pain? No  PRECAUTIONS: Fall  WEIGHT BEARING RESTRICTIONS: No  FALLS: Has patient fallen in last 6 months? No, reports fall 9 months ago, but fearful of falling  LIVING ENVIRONMENT: Lives with: lives with their spouse Lives in: House/apartment Stairs: Yes: Internal: flight steps; on right going up and External: yes steps; can reach both Has following equipment at home: None  PLOF: Independent  PATIENT GOALS: improve balance, upper body strength. Not willing to use AD  OBJECTIVE:   TODAY'S TREATMENT: 05/10/22 Activity Comments  Resisted walking, both ways 2x2 min 15-20#  Seated row 3x10 Up to 35#  Seated lat row 3x10 Up to 45#  Farmer's carry 6x25 ft 15# unilat  Weight sit-stand w/ march 5x5 15#, 5# ankle  Sidestepping x 2 min 5#  Deadlift 3x10 15#, cues in form, initial set with box elevation, not needed thereafter  Alt toe taps 1x10 -on KB handle, instances of LOB  Narrow BOS Passing KB around the body    TODAY'S TREATMENT: 05/03/22 Activity Comments  Dynamic balance warm-up Multi-directional walking 4x25 ft  4-square stepping   Fig 8 walking Attempted with motor dual-tasking by bouncing ball  Seated lat row 1x10 20, 30, 40#  Seated row 2x15 30#  Pt education Resistance training and essential tremor--demo PubMed and its use        PATIENT EDUCATION: Education details: assessment findings, rationale for PT intervention, HEP initiation Person educated: Patient Education method: Explanation,  Demonstration, and Handouts Education comprehension: verbalized understanding, returned demonstration, and needs further education  HOME EXERCISE PROGRAM: Access Code: [email or web address] URL: [email or web address] Date: 04/10/2022 Prepared by: Sherlyn Lees  Exercises - Standing Balance in [place]  - 1 x daily - 7 x weekly - 3 sets - 30 sec hold - Standing Balance in Corner with Eyes Closed  - 1 x daily - 7 x weekly - 3 sets - 30 sec hold - Corner Balance Feet Together: Eyes Open With Head Turns  - 1 x daily - 7 x weekly - 3 sets - 3-5 reps - Corner Balance Feet Together: Eyes Closed With Head Turns  - 1 x daily - 7 x weekly - 3 sets - 3-5 reps - Seated Cervical Retraction  - 1 x daily - 7 x weekly - 3 sets - 10 reps - 3-5 sec hold - Standing Cervical Retraction  - 1 x daily - 7 x weekly - 3 sets - 10 reps - Supine Cervical Retraction with Towel  - 1 x daily - 7 x weekly - 1-3 sets - 10 reps - 3-5 sec hold - Lat Pull Down  - 1 x daily - 7 x weekly - 3 sets - 10 reps - Seated Row Cable Machine  - 1 x daily - 7 x weekly - 3 sets - 10 reps - Deadlift with Pelvic Contraction  - 1 x daily - 7 x weekly - 3 sets - 10 reps  VESTIBULAR ASSESSMENT   GENERAL OBSERVATION: wears progressive lens    SYMPTOM BEHAVIOR:   Subjective history: poor balance   Non-Vestibular symptoms: changes in hearing   Type of dizziness: Unsteady with head/body turns   Frequency: varies   Duration: varies   Aggravating factors:    Relieving factors: slow movements   Progression of symptoms: insidious  OCULOMOTOR EXAM:   Ocular Alignment: normal    Ocular ROM: No Limitations    Spontaneous Nystagmus: absent    Gaze-Induced Nystagmus: absent    Smooth Pursuits: saccades, more towards right tracking    Saccades: hypometric/undershoots and more in a vertical direction     Convergence/Divergence: 4 cm cm    Horizontal head shaking - negative  Vertical head shaking - induced nystagmus:  negative      VESTIBULAR - OCULAR REFLEX:    Slow VOR: Normal   VOR Cancellation: Normal   Head-Impulse Test: HIT Right: positive HIT Left: negative     Dynamic Visual Acuity: Not able to be assessed      POSITIONAL TESTING: NT due to no c/o positional vertigo     MOTION SENSITIVITY:    Motion Sensitivity Quotient  Intensity: 0 = none, 1 = Lightheaded, 2 = Mild, 3 = Moderate, 4 = Severe, 5 = Vomiting  Intensity  1. Sitting to supine   2. Supine to L side   3. Supine to R side   4. Supine to sitting   5. L Hallpike-Dix   6. Up from L    7. R Hallpike-Dix   8. Up from R    9. Sitting, head  tipped to L knee   10. Head up from L  knee   11. Sitting, head  tipped to R knee   12. Head up from R  knee   13. Sitting head turns x5   14.Sitting head nods x5   15. In stance, 180  turn to L    16. In stance, 180  turn to R     OTHOSTATICS: not done    DIAGNOSTIC FINDINGS:   COGNITION: Overall cognitive status: Within functional limits for tasks assessed   SENSATION: Not tested  COORDINATION:   EDEMA:    MUSCLE TONE: resting and action tremor noted, UE > LE   MUSCLE LENGTH: NT  DTRs:    POSTURE: forward head  LOWER EXTREMITY ROM:     Active  Right Eval Left Eval  Hip flexion    Hip extension    Hip abduction    Hip adduction    Hip internal rotation    Hip external rotation    Knee flexion    Knee extension    Ankle dorsiflexion 10 10  Ankle plantarflexion    Ankle inversion    Ankle eversion     (Blank rows = not tested)  LOWER EXTREMITY MMT:    MMT Right Eval Left Eval  Hip flexion 5 4  Hip extension    Hip abduction 5 4  Hip adduction    Hip internal rotation    Hip external rotation    Knee flexion 5 5  Knee extension 5 4  Ankle dorsiflexion 5 5  Ankle plantarflexion    Ankle inversion    Ankle eversion    (Blank rows = not tested)  BED MOBILITY:  NT  TRANSFERS: Assistive device utilized: None  Sit to  stand: Complete Independence Stand to sit: Complete Independence Chair to chair: Complete Independence Floor:  NT  RAMP:  NT  CURB:  NT  STAIRS: Level of Assistance: Modified independence Stair Negotiation Technique: Alternating Pattern  with Single Rail on Right Single Rail on Left Number of Stairs: 15  Height of Stairs: 4-6  Comments:   GAIT: Gait pattern:  unsteady  tremoring presnet Distance walked:  Assistive device utilized: None Level of assistance: Complete Independence  and Modified independence Comments: reduced velocity  FUNCTIONAL TESTS:  Timed up and go (TUG): NT 10 meter walk test: 2.66 ft/sec Berg Balance Scale: NT Functional gait assessment: 16/30  M-CTSIB  Condition 1: Firm Surface, EO 30 Sec, Normal and Mild Sway  Condition 2: Firm Surface, EC 10 Sec, Severe Sway  Condition 3: Foam Surface, EO 20 Sec, Moderate and Severe Sway  Condition 4: Foam Surface, EC 8 Sec, Severe Sway    PATIENT SURVEYS:        GOALS: Goals reviewed with patient? Yes  SHORT TERM GOALS: Target date: 05/01/2022      Patient will be independent in HEP to improve functional outcomes Baseline: Goal status: IN PROGRESS  2.  Demo improved balance and safety with activities such as standing/showering maintaining normal-mild sway per 30 sec condition 2 M-CTSIB Baseline: 10 sec, severe sway/retro LOB Goal status: IN PROGRESS   LONG TERM GOALS: Target date: 05/22/2022    Demo independence with advanced HEP that could include, but not limited to: strength, balance, coordination, posture, and ambulation practices Baseline:  Goal status: IN PROGRESS  2.  Demo 5/5 LLE strength to improve single limb support for improved gait stability Baseline: 4/5 LLE Goal status: IN PROGRESS  3.  Demo improved balance and reduced risk for falls per score 25/30 Functional Gait Assessment Baseline: 16/30 Goal status: IN PROGRESS    ASSESSMENT:  CLINICAL IMPRESSION: Focus on heavy  strength/work to improve lifting form and balance during external demands to reduce LOB.  Difficulty with imposed demands and single limb support. Progressing very well with POC details. Continued sessions to advance STG/LTG  OBJECTIVE IMPAIRMENTS: Abnormal gait, decreased activity tolerance, decreased balance, decreased coordination, decreased knowledge of use of DME, difficulty walking, decreased strength, impaired perceived functional ability, and postural dysfunction.   ACTIVITY LIMITATIONS: carrying, lifting, squatting, stairs, transfers, and locomotion level  PARTICIPATION LIMITATIONS: interpersonal relationship, community activity, and yard work  PERSONAL FACTORS: Age, Time since onset of injury/illness/exacerbation, and 1-2 comorbidities: essential tremor, Right TKR  are also affecting patient's functional outcome.   REHAB POTENTIAL: Good  CLINICAL DECISION MAKING: Evolving/moderate complexity  EVALUATION COMPLEXITY: Moderate  PLAN:  PT FREQUENCY: 1x/week  PT DURATION: 6 weeks  PLANNED INTERVENTIONS: Therapeutic exercises, Therapeutic activity, Neuromuscular re-education, Balance training, Gait training, Patient/Family education, Self Care, Joint mobilization, Stair training, Vestibular training, Canalith repositioning, Orthotic/Fit training, DME instructions, Aquatic Therapy, Dry Needling, Electrical stimulation, Spinal mobilization, Cryotherapy, Moist heat, Taping, Ionotophoresis '4mg'$ /ml Dexamethasone, and Manual therapy  PLAN FOR NEXT SESSION: HEP review, progress balance activities   9:34 AM, 05/03/22 M. Sherlyn Lees, PT, DPT Physical Therapist- [place] Office Number: [phone]

## 2022-05-17 ENCOUNTER — Ambulatory Visit: Payer: Medicare Other

## 2022-05-17 DIAGNOSIS — Z9181 History of falling: Secondary | ICD-10-CM

## 2022-05-17 DIAGNOSIS — R2681 Unsteadiness on feet: Secondary | ICD-10-CM

## 2022-05-17 DIAGNOSIS — M6281 Muscle weakness (generalized): Secondary | ICD-10-CM | POA: Diagnosis not present

## 2022-05-17 DIAGNOSIS — R293 Abnormal posture: Secondary | ICD-10-CM | POA: Diagnosis not present

## 2022-05-17 DIAGNOSIS — R2689 Other abnormalities of gait and mobility: Secondary | ICD-10-CM

## 2022-05-17 NOTE — Therapy (Signed)
OUTPATIENT PHYSICAL THERAPY NEURO TREATMENT, Progress Note, and Recertifiction   Patient Name: Cole Martinez MRN: [id number] DOB:Oct 15, 1945, 77 y.o., male Today's Date: 05/17/2022   PCP: Josetta Huddle, MD REFERRING PROVIDER: Suzzanne Cloud, NP  Progress Note Reporting Period 04/10/22 to 05/17/22  See note below for Objective Data and Assessment of Progress/Goals.     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--  ONCOLOGIST-  DR Benay Spice   PT IS ASYMPTOMATIC--- LAST CBC W/ DIFF 06-24-2012 STABLE   Coronary artery disease CARDIOLOGIST- DR Daneen Schick   S/P STENTING LAD 1999 // Myoview 01/2019: EF 62, normal perfusion; Low Risk   Crohn's disease of ileum ([place]) [place]   ED (erectile dysfunction)    Elevated PSA    Essential and other specified forms of tremor 11/25/2012   Gait abnormality 05/17/2020   H/O adenomatous polyp of colon    Hyperlipidemia    Hypertension    Nocturia    OA (osteoarthritis)    Peripheral neuropathy    hx of, none current as of 08-04-13   Peyronie disease    S/P coronary artery stent placement OCT 1999 OF LAD   Tremor, hereditary, benign MILD RIGHT HAND   Past Surgical History:  Procedure Laterality Date   CARDIOVASCULAR STRESS TEST  11-01-2010 DR Daneen Schick   NORMAL PERFUSION STUDY/ EF 64%/ NO ISCHEMIA   cataract surgery  Bilateral feburary 2020   with lens placement    COLONOSCOPY WITH PROPOFOL N/A 09/24/2012   Procedure: COLONOSCOPY WITH PROPOFOL;  Surgeon: [name],  MD;  Location: [facility];  Service: Endoscopy;  Laterality: N/A;   CORONARY ANGIOPLASTY WITH STENT PLACEMENT  OCT 1999   STENT OF LAD   CORONARY STENT INTERVENTION N/A 02/14/2019   Procedure: CORONARY STENT INTERVENTION;  Surgeon: Belva Crome, MD;  Location: [facility];  Service: Cardiovascular;  Laterality: N/A;   CYSTOSCOPY WITH URETHRAL DILATATION  03/12/2017   Procedure: CYSTOSCOPY WITH URETHRAL DILATATION;  Surgeon: Gaynelle Arabian, MD;  Location: [facility];  Service: Orthopedics;;  Ammie Dalton, Resident Assisting   CYSTOSCOPY WITH URETHRAL DILATATION N/A 10/03/2018   Procedure: CYSTOSCOPY WITH URETHRAL BALLOON DILATATION WITH BILATERAL RETROGRADE PYELOGRAPHY;  Surgeon: Raynelle Bring, MD;  Location: [facility];  Service: Urology;  Laterality: N/A;   CYSTOSCOPY WITH URETHRAL DILATATION N/A 12/30/2020   Procedure: CYSTOSCOPY WITH BALLOON DILATATION OF URETHRAL STRICTURE;  Surgeon: Raynelle Bring, MD;  Location: [facility];  Service: Urology;  Laterality: N/A;   LAPAROSCOPIC INGUINAL HERNIA REPAIR Bilateral 01-10-2004   W/ MESH   LEFT HEART CATH AND CORONARY ANGIOGRAPHY N/A 02/13/2019   Procedure: LEFT HEART CATH AND CORONARY ANGIOGRAPHY;  Surgeon: Belva Crome, MD;  Location: [facility];  Service: Cardiovascular;  Laterality: N/A;   neck benign removed from neck  yrs ago   PROSTATE BIOPSY  N/A 07/12/2012   Procedure: PROSTATE BIOPSY AND ULTRASOUND;  Surgeon: Ailene Rud, MD;  Location: [facility];  Service: Urology;  Laterality: N/A;   PROSTATE SURGERY  2002   tuna   REMOVAL LEFT NECK LYMPH NODE  1996   TONSILLECTOMY  CHILD   TOTAL KNEE ARTHROPLASTY Right 03/12/2017   Procedure: RIGHT TOTAL KNEE ARTHROPLASTY;  Surgeon: Gaynelle Arabian, MD;  Location: [facility];  Service: Orthopedics;  Laterality: Right;  Adductor Block   TRANSURETHRAL RESECTION OF PROSTATE N/A 08/08/2013   Procedure: TRANSURETHRAL RESECTION OF THE PROSTATE WITH GYRUS INSTRUMENTS;  Surgeon: Ailene Rud, MD;  Location: [facility];  Service: Urology;  Laterality: N/A;   TRANSURETHRAL RESECTION OF PROSTATE     Patient Active Problem List   Diagnosis Date Noted   Adult attention deficit disorder 06/04/2020   Chronic lymphocytic leukemia ([place]) 06/04/2020   Conductive hearing loss, bilateral 06/04/2020   Crohn's ileitis ([place]) 06/04/2020   Disorder of musculoskeletal system 06/04/2020   Erectile dysfunction 06/04/2020   Gastroesophageal reflux disease 06/04/2020   Gout 06/04/2020   History of adenomatous polyp of colon 06/04/2020   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   Hypothyroidism 06/04/2020   Increased frequency of urination 06/04/2020   Male hypogonadism 06/04/2020   Muscle pain 06/04/2020   Nocturia 06/04/2020   Obesity 06/04/2020   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   Osteopenia 06/04/2020   Peripheral neurogenic pain 06/04/2020   Pure hypercholesterolemia 06/04/2020   Recurrent falls 06/04/2020   Unspecified kyphosis, thoracic region 06/04/2020   Vitamin B12 deficiency 06/04/2020   Vitamin D deficiency 06/04/2020   Gait abnormality 05/17/2020   Ataxia 03/16/2020   Angina pectoris ([place]) 02/13/2019   Pseudophakia of both eyes 08/01/2018   History of total knee arthroplasty 03/29/2017   Stiffness of right knee 03/16/2017   OA (osteoarthritis) of knee 03/12/2017   Tremor, essential 12/03/2015   Essential hypertension 12/09/2014   Coronary artery disease involving native heart 12/08/2013   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[place]) 12/08/2013   Hyperlipidemia 12/08/2013   Benign prostatic hypertrophy 08/08/2013   Essential and other specified forms of tremor 11/25/2012    ONSET DATE: tremor has been present for years  REFERRING DIAG: R26.89 (ICD-10-CM) - Balance problem G25.0 (ICD-10-CM) - Essential tremor  THERAPY DIAG:  Unsteadiness on feet  Other abnormalities of gait and mobility  Abnormal posture  Muscle weakness (generalized)  History of  falling  Rationale for Evaluation and Treatment: Rehabilitation  SUBJECTIVE:  SUBJECTIVE STATEMENT: Continuing to practice activities at gym for balance and initiating strengthening  Pt accompanied by: self  PERTINENT HISTORY: underlying complex medical history of coronary artery disease with status post stenting, hypertension, hyperlipidemia, elevated PSA, urethral stricture, Crohn's disease, CLL, ADHD, vitamin B12 deficiency, , R TKR, osteoarthritis, neuropathy, and tremor,  PAIN:  Are you having pain? No  PRECAUTIONS: Fall  WEIGHT BEARING RESTRICTIONS: No  FALLS: Has patient fallen in last 6 months? No, reports fall 9 months ago, but fearful of falling  LIVING ENVIRONMENT: Lives with: lives with their spouse Lives in: House/apartment Stairs: Yes: Internal: flight steps; on right going up and External: yes steps; can reach both Has following equipment at home: None  PLOF: Independent  PATIENT GOALS: improve balance, upper body strength. Not willing to use AD  OBJECTIVE:   TODAY'S TREATMENT: 05/17/22 Activity Comments  Dynamic balance warmup   Functional Gait Assessment 20/30  M-CTSIB Condition 1: normal-mild Condition 2: mild Condition 3: mild-mod Condition 4: severe x 8 sec  LE strength LLE: 4+/5 RLE 5/5 Plantarflexion strength 5/5 Unable to toe walk, unable to heel walk  Discussion regarding compound push movements upper/lower body Pictures/demonstrations for reference        TODAY'S TREATMENT: 05/10/22 Activity Comments  Resisted walking, both ways 2x2 min 15-20#  Seated row 3x10 Up to 35#  Seated lat row 3x10 Up to 45#  Farmer's carry 6x25 ft 15# unilat  Weight sit-stand w/ march 5x5 15#, 5# ankle  Sidestepping x 2 min 5#  Deadlift 3x10 15#, cues in form, initial set with  box elevation, not needed thereafter  Alt toe taps 1x10 -on KB handle, instances of LOB  Narrow BOS Passing KB around the body    TODAY'S TREATMENT: 05/03/22 Activity Comments  Dynamic balance warm-up Multi-directional walking 4x25 ft  4-square stepping   Fig 8 walking Attempted with motor dual-tasking by bouncing ball  Seated lat row 1x10 20, 30, 40#  Seated row 2x15 30#  Pt education Resistance training and essential tremor--demo PubMed and its use        PATIENT EDUCATION: Education details: assessment findings, rationale for PT intervention, HEP initiation Person educated: Patient Education method: Explanation, Demonstration, and Handouts Education comprehension: verbalized understanding, returned demonstration, and needs further education  HOME EXERCISE PROGRAM: Access Code: [email or web address] URL: [email or web address] Date: 04/10/2022 Prepared by: Sherlyn Lees  Exercises - Standing Balance in [place]  - 1 x daily - 7 x weekly - 3 sets - 30 sec hold - Standing Balance in [place] with Eyes Closed  - 1 x daily - 7 x weekly - 3 sets - 30 sec hold - Corner Balance Feet Together: Eyes Open With Head Turns  - 1 x daily - 7 x weekly - 3 sets - 3-5 reps - Corner Balance Feet Together: Eyes Closed With Head Turns  - 1 x daily - 7 x weekly - 3 sets - 3-5 reps - Seated Cervical Retraction  - 1 x daily - 7 x weekly - 3 sets - 10 reps - 3-5 sec hold - Standing Cervical Retraction  - 1 x daily - 7 x weekly - 3 sets - 10 reps - Supine Cervical Retraction with Towel  - 1 x daily - 7 x weekly - 1-3 sets - 10 reps - 3-5 sec hold - Lat Pull Down  - 1 x daily - 7 x weekly - 3 sets - 10 reps - Seated Row Cable Machine  - 1 x daily - 7 x  weekly - 3 sets - 10 reps - Deadlift with Pelvic Contraction  - 1 x daily - 7 x weekly - 3 sets - 10 reps  VESTIBULAR ASSESSMENT   GENERAL OBSERVATION: wears progressive lens    SYMPTOM BEHAVIOR:   Subjective history: poor balance   Non-Vestibular  symptoms: changes in hearing   Type of dizziness: Unsteady with head/body turns   Frequency: varies   Duration: varies   Aggravating factors:    Relieving factors: slow movements   Progression of symptoms: insidious   OCULOMOTOR EXAM:   Ocular Alignment: normal    Ocular ROM: No Limitations    Spontaneous Nystagmus: absent    Gaze-Induced Nystagmus: absent    Smooth Pursuits: saccades, more towards right tracking    Saccades: hypometric/undershoots and more in a vertical direction     Convergence/Divergence: 4 cm cm    Horizontal head shaking - negative  Vertical head shaking - induced nystagmus: negative      VESTIBULAR - OCULAR REFLEX:    Slow VOR: Normal   VOR Cancellation: Normal   Head-Impulse Test: HIT Right: positive HIT Left: negative     Dynamic Visual Acuity: Not able to be assessed      POSITIONAL TESTING: NT due to no c/o positional vertigo     MOTION SENSITIVITY:    Motion Sensitivity Quotient  Intensity: 0 = none, 1 = Lightheaded, 2 = Mild, 3 = Moderate, 4 = Severe, 5 = Vomiting  Intensity  1. Sitting to supine   2. Supine to L side   3. Supine to R side   4. Supine to sitting   5. L Hallpike-Dix   6. Up from L    7. R Hallpike-Dix   8. Up from R    9. Sitting, head  tipped to L knee   10. Head up from L  knee   11. Sitting, head  tipped to R knee   12. Head up from R  knee   13. Sitting head turns x5   14.Sitting head nods x5   15. In stance, 180  turn to L    16. In stance, 180  turn to R     OTHOSTATICS: not done    DIAGNOSTIC FINDINGS:   COGNITION: Overall cognitive status: Within functional limits for tasks assessed   SENSATION: Not tested  COORDINATION:   EDEMA:    MUSCLE TONE: resting and action tremor noted, UE > LE   MUSCLE LENGTH: NT  DTRs:    POSTURE: forward head  LOWER EXTREMITY ROM:     Active  Right Eval Left Eval  Hip flexion    Hip extension    Hip abduction    Hip adduction     Hip internal rotation    Hip external rotation    Knee flexion    Knee extension    Ankle dorsiflexion 10 10  Ankle plantarflexion    Ankle inversion    Ankle eversion     (Blank rows = not tested)  LOWER EXTREMITY MMT:    MMT Right Eval Left Eval  Hip flexion 5 4  Hip extension    Hip abduction 5 4  Hip adduction    Hip internal rotation    Hip external rotation    Knee flexion 5 5  Knee extension 5 4  Ankle dorsiflexion 5 5  Ankle plantarflexion    Ankle inversion    Ankle eversion    (Blank rows = not tested)  BED  MOBILITY:  NT  TRANSFERS: Assistive device utilized: None  Sit to stand: Complete Independence Stand to sit: Complete Independence Chair to chair: Complete Independence Floor:  NT  RAMP:  NT  CURB:  NT  STAIRS: Level of Assistance: Modified independence Stair Negotiation Technique: Alternating Pattern  with Single Rail on Right Single Rail on Left Number of Stairs: 15  Height of Stairs: 4-6  Comments:   GAIT: Gait pattern:  unsteady  tremoring presnet Distance walked:  Assistive device utilized: None Level of assistance: Complete Independence and Modified independence Comments: reduced velocity  FUNCTIONAL TESTS:  Timed up and go (TUG): NT 10 meter walk test: 2.66 ft/sec Berg Balance Scale: NT Functional gait assessment: 16/30  M-CTSIB  Condition 1: Firm Surface, EO 30 Sec, Normal and Mild Sway  Condition 2: Firm Surface, EC 10 Sec, Severe Sway  Condition 3: Foam Surface, EO 20 Sec, Moderate and Severe Sway  Condition 4: Foam Surface, EC 8 Sec, Severe Sway    PATIENT SURVEYS:        GOALS: Goals reviewed with patient? Yes  SHORT TERM GOALS: Target date: 05/01/2022      Patient will be independent in HEP to improve functional outcomes Baseline: Goal status: MET  2.  Demo improved balance and safety with activities such as standing/showering maintaining normal-mild sway per 30 sec condition 2 M-CTSIB Baseline: 10  sec, severe sway/retro LOB Goal status: MET   LONG TERM GOALS: Target date: 07/12/2022      Demo independence with advanced HEP that could include, but not limited to: strength, balance, coordination, posture, and ambulation practices Baseline:  Goal status: IN PROGRESS  2.  Demo 5/5 LLE strength to improve single limb support for improved gait stability Baseline: 4/5 LLE; (05/17/22) 4+/5 Goal status: IN PROGRESS  3.  Demo improved balance and reduced risk for falls per score 25/30 Functional Gait Assessment Baseline: 16/30; (05/17/22) 20/30 Goal status: IN PROGRESS    ASSESSMENT:  CLINICAL IMPRESSION: Continued with focus on dynamic balance and activities to facilitate and initiate righting reactions. Difficulty in crossing midline with activities such as tandem walking and braided steps.  Review of STG/LTG for assessment and demonstrates improved postural control per condition 2 M-CTSIB and 4-point improvement in his Functional Gait Assessment score.  Balance deficits and non-functional righting reactions still an issue that would benefit from continued PT sessions to address to reduce overall risk for falls  OBJECTIVE IMPAIRMENTS: Abnormal gait, decreased activity tolerance, decreased balance, decreased coordination, decreased knowledge of use of DME, difficulty walking, decreased strength, impaired perceived functional ability, and postural dysfunction.   ACTIVITY LIMITATIONS: carrying, lifting, squatting, stairs, transfers, and locomotion level  PARTICIPATION LIMITATIONS: interpersonal relationship, community activity, and yard work  PERSONAL FACTORS: Age, Time since onset of injury/illness/exacerbation, and 1-2 comorbidities: essential tremor, Right TKR  are also affecting patient's functional outcome.   REHAB POTENTIAL: Good  CLINICAL DECISION MAKING: Evolving/moderate complexity  EVALUATION COMPLEXITY: Moderate  PLAN:  PT FREQUENCY: 1x/week  PT DURATION: 6  weeks  PLANNED INTERVENTIONS: Therapeutic exercises, Therapeutic activity, Neuromuscular re-education, Balance training, Gait training, Patient/Family education, Self Care, Joint mobilization, Stair training, Vestibular training, Canalith repositioning, Orthotic/Fit training, DME instructions, Aquatic Therapy, Dry Needling, Electrical stimulation, Spinal mobilization, Cryotherapy, Moist heat, Taping, Ionotophoresis '4mg'$ /ml Dexamethasone, and Manual therapy  PLAN FOR NEXT SESSION: HEP review, progress balance activities   10:52 AM, 05/17/22 M. Sherlyn Lees, PT, DPT Physical Therapist- [place] Office Number: [phone]

## 2022-05-19 ENCOUNTER — Other Ambulatory Visit: Payer: Medicare Other

## 2022-05-19 DIAGNOSIS — Z23 Encounter for immunization: Secondary | ICD-10-CM | POA: Diagnosis not present

## 2022-05-24 ENCOUNTER — Ambulatory Visit: Payer: Medicare Other

## 2022-05-24 DIAGNOSIS — M6281 Muscle weakness (generalized): Secondary | ICD-10-CM

## 2022-05-24 DIAGNOSIS — Z9181 History of falling: Secondary | ICD-10-CM | POA: Diagnosis not present

## 2022-05-24 DIAGNOSIS — R2689 Other abnormalities of gait and mobility: Secondary | ICD-10-CM

## 2022-05-24 DIAGNOSIS — R293 Abnormal posture: Secondary | ICD-10-CM

## 2022-05-24 DIAGNOSIS — R2681 Unsteadiness on feet: Secondary | ICD-10-CM | POA: Diagnosis not present

## 2022-05-24 NOTE — Therapy (Signed)
OUTPATIENT PHYSICAL THERAPY NEURO TREATMENT   Patient Name: Cole Martinez MRN: [id number] DOB:05/23/1945, 77 y.o., male Today's Date: 05/24/2022   PCP: Josetta Huddle, MD REFERRING PROVIDER: Suzzanne Cloud, NP    END OF SESSION:  PT End of Session - 05/24/22 0936     Visit Number 7    Number of Visits 12    Date for PT Re-Evaluation 07/12/22    Authorization Type Medicare A&B/ [place]    Progress Note Due on Visit 16    PT Start Time 0930    PT Stop Time [id number]    PT Time Calculation (min) 45 min             Past Medical History:  Diagnosis Date   ADHD (attention deficit hyperactivity disorder)    B12 deficiency    BPH (benign prostatic hypertrophy)    Chronic lymphocytic leukemia (CLL), 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--  ONCOLOGIST-  DR Benay Spice   PT IS ASYMPTOMATIC--- LAST CBC W/ DIFF 06-24-2012 STABLE   Coronary artery disease CARDIOLOGIST- DR Daneen Schick   S/P STENTING LAD 1999 // Myoview 01/2019: EF 62, normal perfusion; Low Risk   Crohn's disease of ileum ([place]) [place]   ED (erectile dysfunction)    Elevated PSA    Essential and other specified forms of tremor 11/25/2012   Gait abnormality 05/17/2020   H/O adenomatous polyp of colon    Hyperlipidemia    Hypertension    Nocturia    OA (osteoarthritis)    Peripheral neuropathy    hx of, none current as of 08-04-13   Peyronie disease    S/P coronary artery stent placement OCT 1999 OF LAD   Tremor, hereditary, benign MILD RIGHT HAND   Past Surgical History:  Procedure Laterality Date   CARDIOVASCULAR STRESS TEST  11-01-2010 DR Daneen Schick   NORMAL PERFUSION STUDY/ EF 64%/ NO ISCHEMIA   cataract surgery  Bilateral feburary 2020   with lens placement    COLONOSCOPY WITH PROPOFOL N/A 09/24/2012   Procedure: COLONOSCOPY WITH PROPOFOL;  Surgeon: [name], MD;  Location: [facility];  Service: Endoscopy;  Laterality: N/A;   CORONARY ANGIOPLASTY WITH STENT PLACEMENT  OCT 1999   STENT OF LAD   CORONARY STENT  INTERVENTION N/A 02/14/2019   Procedure: CORONARY STENT INTERVENTION;  Surgeon: Belva Crome, MD;  Location: [facility];  Service: Cardiovascular;  Laterality: N/A;   CYSTOSCOPY WITH URETHRAL DILATATION  03/12/2017   Procedure: CYSTOSCOPY WITH URETHRAL DILATATION;  Surgeon: Gaynelle Arabian, MD;  Location: [facility];  Service: Orthopedics;;  Ammie Dalton, Resident Assisting   CYSTOSCOPY WITH URETHRAL DILATATION N/A 10/03/2018   Procedure: CYSTOSCOPY WITH URETHRAL BALLOON DILATATION WITH BILATERAL RETROGRADE PYELOGRAPHY;  Surgeon: Raynelle Bring, MD;  Location: [facility];  Service: Urology;  Laterality: N/A;   CYSTOSCOPY WITH URETHRAL DILATATION N/A 12/30/2020   Procedure: CYSTOSCOPY WITH BALLOON DILATATION OF URETHRAL STRICTURE;  Surgeon: Raynelle Bring, MD;  Location: [facility];  Service: Urology;  Laterality: N/A;   LAPAROSCOPIC INGUINAL HERNIA REPAIR Bilateral 01-10-2004   W/ MESH   LEFT HEART CATH AND CORONARY ANGIOGRAPHY N/A 02/13/2019   Procedure: LEFT HEART CATH AND CORONARY ANGIOGRAPHY;  Surgeon: Belva Crome, MD;  Location: [facility];  Service: Cardiovascular;  Laterality: N/A;   neck benign removed from neck  yrs ago   PROSTATE BIOPSY N/A 07/12/2012   Procedure: PROSTATE BIOPSY AND ULTRASOUND;  Surgeon: Ailene Rud, MD;  Location: [facility];  Service: Urology;  Laterality: N/A;   PROSTATE SURGERY  2002   tuna   REMOVAL LEFT NECK LYMPH NODE  1996   TONSILLECTOMY  CHILD   TOTAL KNEE ARTHROPLASTY Right 03/12/2017   Procedure: RIGHT TOTAL KNEE ARTHROPLASTY;  Surgeon: Gaynelle Arabian, MD;  Location: [facility];  Service: Orthopedics;  Laterality: Right;  Adductor Block   TRANSURETHRAL RESECTION OF PROSTATE N/A 08/08/2013   Procedure: TRANSURETHRAL RESECTION OF THE PROSTATE WITH GYRUS INSTRUMENTS;  Surgeon: Ailene Rud, MD;  Location: [facility];  Service: Urology;  Laterality: N/A;   TRANSURETHRAL RESECTION OF PROSTATE     Patient Active Problem List   Diagnosis  Date Noted   Adult attention deficit disorder 06/04/2020   Chronic lymphocytic leukemia ([place]) 06/04/2020   Conductive hearing loss, bilateral 06/04/2020   Crohn's ileitis ([place]) 06/04/2020   Disorder of musculoskeletal system 06/04/2020   Erectile dysfunction 06/04/2020   Gastroesophageal reflux disease 06/04/2020   Gout 06/04/2020   History of adenomatous polyp of colon 06/04/2020   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   Hypothyroidism 06/04/2020   Increased frequency of urination 06/04/2020   Male hypogonadism 06/04/2020   Muscle pain 06/04/2020   Nocturia 06/04/2020   Obesity 06/04/2020   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   Osteopenia 06/04/2020   Peripheral neurogenic pain 06/04/2020   Pure hypercholesterolemia 06/04/2020   Recurrent falls 06/04/2020   Unspecified kyphosis, thoracic region 06/04/2020   Vitamin B12 deficiency 06/04/2020   Vitamin D deficiency 06/04/2020   Gait abnormality 05/17/2020   Ataxia 03/16/2020   Angina pectoris ([place]) 02/13/2019   Pseudophakia of both eyes 08/01/2018   History of total knee arthroplasty 03/29/2017   Stiffness of right knee 03/16/2017   OA (osteoarthritis) of knee 03/12/2017   Tremor, essential 12/03/2015   Essential hypertension 12/09/2014   Coronary artery disease involving native heart 12/08/2013   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[place]) 12/08/2013   Hyperlipidemia 12/08/2013   Benign prostatic hypertrophy 08/08/2013   Essential and other specified forms of tremor 11/25/2012    ONSET DATE: tremor has been present for years  REFERRING DIAG: R26.89 (ICD-10-CM) - Balance problem G25.0 (ICD-10-CM) - Essential tremor  THERAPY DIAG:  Unsteadiness on feet  Other abnormalities of gait and mobility  Abnormal posture  Muscle weakness (generalized)  History of falling  Rationale for Evaluation and Treatment: Rehabilitation  SUBJECTIVE:                                                                                                                                                                                              SUBJECTIVE STATEMENT: Working out at [employer] several days  Pt accompanied  by: self  PERTINENT HISTORY: underlying complex medical history of coronary artery disease with status post stenting, hypertension, hyperlipidemia, elevated PSA, urethral stricture, Crohn's disease, CLL, ADHD, vitamin B12 deficiency, , R TKR, osteoarthritis, neuropathy, and tremor,  PAIN:  Are you having pain? No  PRECAUTIONS: Fall  WEIGHT BEARING RESTRICTIONS: No  FALLS: Has patient fallen in last 6 months? No, reports fall 9 months ago, but fearful of falling  LIVING ENVIRONMENT: Lives with: lives with their spouse Lives in: House/apartment Stairs: Yes: Internal: flight steps; on right going up and External: yes steps; can reach both Has following equipment at home: None  PLOF: Independent  PATIENT GOALS: improve balance, upper body strength. Not willing to use AD  OBJECTIVE:   TODAY'S TREATMENT: 05/24/22 Activity Comments  Recumbent bike x 8 min -hills program for intervals, level 5  Deadlift 2x10 -15# KB  Kettlebell pass around the body -30 sec ea direction on firm/compliant  Farmer's march x 30 sec KB hold, 4# scaption hold  Resisted walking -retrowalk 4x45 ft pulling heavy resistance, HR of 100 bpm -forward walk with resistance with unexpected release for righting reactions  Weighted step-ups 2x10 -15# KB contralat hold    TODAY'S TREATMENT: 05/17/22 Activity Comments  Dynamic balance warmup   Functional Gait Assessment 20/30  M-CTSIB Condition 1: normal-mild Condition 2: mild Condition 3: mild-mod Condition 4: severe x 8 sec  LE strength LLE: 4+/5 RLE 5/5 Plantarflexion strength 5/5 Unable to toe walk, unable to heel walk  Discussion regarding compound push movements upper/lower body Pictures/demonstrations for reference            PATIENT EDUCATION: Education details:  assessment findings, rationale for PT intervention, HEP initiation Person educated: Patient Education method: Explanation, Demonstration, and Handouts Education comprehension: verbalized understanding, returned demonstration, and needs further education  HOME EXERCISE PROGRAM: Access Code: [email or web address] URL: [email or web address] Date: 04/10/2022 Prepared by: Sherlyn Lees  Exercises - Standing Balance in [place]  - 1 x daily - 7 x weekly - 3 sets - 30 sec hold - Standing Balance in Corner with Eyes Closed  - 1 x daily - 7 x weekly - 3 sets - 30 sec hold - Corner Balance Feet Together: Eyes Open With Head Turns  - 1 x daily - 7 x weekly - 3 sets - 3-5 reps - Corner Balance Feet Together: Eyes Closed With Head Turns  - 1 x daily - 7 x weekly - 3 sets - 3-5 reps - Seated Cervical Retraction  - 1 x daily - 7 x weekly - 3 sets - 10 reps - 3-5 sec hold - Standing Cervical Retraction  - 1 x daily - 7 x weekly - 3 sets - 10 reps - Supine Cervical Retraction with Towel  - 1 x daily - 7 x weekly - 1-3 sets - 10 reps - 3-5 sec hold - Lat Pull Down  - 1 x daily - 7 x weekly - 3 sets - 10 reps - Seated Row Cable Machine  - 1 x daily - 7 x weekly - 3 sets - 10 reps - Deadlift with Pelvic Contraction  - 1 x daily - 7 x weekly - 3 sets - 10 reps - Full Leg Press  - 1 x daily - 7 x weekly - 3 sets - 10 reps - Seated Chest Press with Dumbbells  - 1 x daily - 7 x weekly - 3 sets - 10 reps  VESTIBULAR ASSESSMENT   GENERAL OBSERVATION: wears progressive lens  SYMPTOM BEHAVIOR:   Subjective history: poor balance   Non-Vestibular symptoms: changes in hearing   Type of dizziness: Unsteady with head/body turns   Frequency: varies   Duration: varies   Aggravating factors:    Relieving factors: slow movements   Progression of symptoms: insidious   OCULOMOTOR EXAM:   Ocular Alignment: normal    Ocular ROM: No Limitations    Spontaneous Nystagmus: absent    Gaze-Induced Nystagmus:  absent    Smooth Pursuits: saccades, more towards right tracking    Saccades: hypometric/undershoots and more in a vertical direction     Convergence/Divergence: 4 cm cm    Horizontal head shaking - negative  Vertical head shaking - induced nystagmus: negative      VESTIBULAR - OCULAR REFLEX:    Slow VOR: Normal   VOR Cancellation: Normal   Head-Impulse Test: HIT Right: positive HIT Left: negative     Dynamic Visual Acuity: Not able to be assessed      POSITIONAL TESTING: NT due to no c/o positional vertigo     MOTION SENSITIVITY:    Motion Sensitivity Quotient  Intensity: 0 = none, 1 = Lightheaded, 2 = Mild, 3 = Moderate, 4 = Severe, 5 = Vomiting  Intensity  1. Sitting to supine   2. Supine to L side   3. Supine to R side   4. Supine to sitting   5. L Hallpike-Dix   6. Up from L    7. R Hallpike-Dix   8. Up from R    9. Sitting, head  tipped to L knee   10. Head up from L  knee   11. Sitting, head  tipped to R knee   12. Head up from R  knee   13. Sitting head turns x5   14.Sitting head nods x5   15. In stance, 180  turn to L    16. In stance, 180  turn to R     OTHOSTATICS: not done    DIAGNOSTIC FINDINGS:   COGNITION: Overall cognitive status: Within functional limits for tasks assessed   SENSATION: Not tested  COORDINATION:   EDEMA:    MUSCLE TONE: resting and action tremor noted, UE > LE   MUSCLE LENGTH: NT  DTRs:    POSTURE: forward head  LOWER EXTREMITY ROM:     Active  Right Eval Left Eval  Hip flexion    Hip extension    Hip abduction    Hip adduction    Hip internal rotation    Hip external rotation    Knee flexion    Knee extension    Ankle dorsiflexion 10 10  Ankle plantarflexion    Ankle inversion    Ankle eversion     (Blank rows = not tested)  LOWER EXTREMITY MMT:    MMT Right Eval Left Eval  Hip flexion 5 4  Hip extension    Hip abduction 5 4  Hip adduction    Hip internal rotation     Hip external rotation    Knee flexion 5 5  Knee extension 5 4  Ankle dorsiflexion 5 5  Ankle plantarflexion    Ankle inversion    Ankle eversion    (Blank rows = not tested)  BED MOBILITY:  NT  TRANSFERS: Assistive device utilized: None  Sit to stand: Complete Independence Stand to sit: Complete Independence Chair to chair: Complete Independence Floor:  NT  RAMP:  NT  CURB:  NT  STAIRS: Level of  Assistance: Modified independence Stair Negotiation Technique: Alternating Pattern  with Single Rail on Right Single Rail on Left Number of Stairs: 15  Height of Stairs: 4-6  Comments:   GAIT: Gait pattern:  unsteady  tremoring presnet Distance walked:  Assistive device utilized: None Level of assistance: Complete Independence and Modified independence Comments: reduced velocity  FUNCTIONAL TESTS:  Timed up and go (TUG): NT 10 meter walk test: 2.66 ft/sec Berg Balance Scale: NT Functional gait assessment: 16/30  M-CTSIB  Condition 1: Firm Surface, EO 30 Sec, Normal and Mild Sway  Condition 2: Firm Surface, EC 10 Sec, Severe Sway  Condition 3: Foam Surface, EO 20 Sec, Moderate and Severe Sway  Condition 4: Foam Surface, EC 8 Sec, Severe Sway    PATIENT SURVEYS:        GOALS: Goals reviewed with patient? Yes  SHORT TERM GOALS: Target date: 05/01/2022      Patient will be independent in HEP to improve functional outcomes Baseline: Goal status: MET  2.  Demo improved balance and safety with activities such as standing/showering maintaining normal-mild sway per 30 sec condition 2 M-CTSIB Baseline: 10 sec, severe sway/retro LOB Goal status: MET   LONG TERM GOALS: Target date: 07/12/2022      Demo independence with advanced HEP that could include, but not limited to: strength, balance, coordination, posture, and ambulation practices Baseline:  Goal status: IN PROGRESS  2.  Demo 5/5 LLE strength to improve single limb support for improved gait  stability Baseline: 4/5 LLE; (05/17/22) 4+/5 Goal status: IN PROGRESS  3.  Demo improved balance and reduced risk for falls per score 25/30 Functional Gait Assessment Baseline: 16/30; (05/17/22) 20/30 Goal status: IN PROGRESS    ASSESSMENT:  CLINICAL IMPRESSION: Training focus on improving heavy work tolerance and body control/coordination against heavy external resistance for postural perturbations.  Increased effort/output with corresponding fatigue and tendency for retropulsion noted and LOB. HR threshold stayed in good zone of 60-80% HR max. Continued sessions to advance strength and balance activities to faiclitate righting reactions and reduce risk for falls  OBJECTIVE IMPAIRMENTS: Abnormal gait, decreased activity tolerance, decreased balance, decreased coordination, decreased knowledge of use of DME, difficulty walking, decreased strength, impaired perceived functional ability, and postural dysfunction.   ACTIVITY LIMITATIONS: carrying, lifting, squatting, stairs, transfers, and locomotion level  PARTICIPATION LIMITATIONS: interpersonal relationship, community activity, and yard work  PERSONAL FACTORS: Age, Time since onset of injury/illness/exacerbation, and 1-2 comorbidities: essential tremor, Right TKR  are also affecting patient's functional outcome.   REHAB POTENTIAL: Good  CLINICAL DECISION MAKING: Evolving/moderate complexity  EVALUATION COMPLEXITY: Moderate  PLAN:  PT FREQUENCY: 1x/week  PT DURATION: 6 weeks  PLANNED INTERVENTIONS: Therapeutic exercises, Therapeutic activity, Neuromuscular re-education, Balance training, Gait training, Patient/Family education, Self Care, Joint mobilization, Stair training, Vestibular training, Canalith repositioning, Orthotic/Fit training, DME instructions, Aquatic Therapy, Dry Needling, Electrical stimulation, Spinal mobilization, Cryotherapy, Moist heat, Taping, Ionotophoresis 4mg /ml Dexamethasone, and Manual therapy  PLAN FOR  NEXT SESSION: HEP review, progress balance activities   9:37 AM, 05/24/22 M. Sherlyn Lees, PT, DPT Physical Therapist- [place] Office Number: [phone]

## 2022-06-02 ENCOUNTER — Ambulatory Visit
Admission: RE | Admit: 2022-06-02 | Discharge: 2022-06-02 | Disposition: A | Discharge status: Home / Self Care | Payer: Medicare Other | Source: Ambulatory Visit | Attending: Internal Medicine | Admitting: Internal Medicine

## 2022-06-02 DIAGNOSIS — M8589 Other specified disorders of bone density and structure, multiple sites: Secondary | ICD-10-CM | POA: Diagnosis not present

## 2022-06-02 DIAGNOSIS — M8580 Other specified disorders of bone density and structure, unspecified site: Secondary | ICD-10-CM

## 2022-06-06 ENCOUNTER — Ambulatory Visit: Payer: Medicare Other | Attending: Neurology

## 2022-06-06 DIAGNOSIS — R293 Abnormal posture: Secondary | ICD-10-CM | POA: Insufficient documentation

## 2022-06-06 DIAGNOSIS — R2681 Unsteadiness on feet: Secondary | ICD-10-CM | POA: Diagnosis not present

## 2022-06-06 DIAGNOSIS — Z9181 History of falling: Secondary | ICD-10-CM

## 2022-06-06 DIAGNOSIS — M6281 Muscle weakness (generalized): Secondary | ICD-10-CM | POA: Insufficient documentation

## 2022-06-06 DIAGNOSIS — R2689 Other abnormalities of gait and mobility: Secondary | ICD-10-CM | POA: Insufficient documentation

## 2022-06-06 NOTE — Therapy (Signed)
OUTPATIENT PHYSICAL THERAPY NEURO TREATMENT   Patient Name: Cole Martinez MRN: [id number] DOB:1945-12-03, 77 y.o., male Today's Date: 06/06/2022   PCP: Josetta Huddle, MD REFERRING PROVIDER: Suzzanne Cloud, NP    END OF SESSION:  PT End of Session - 06/06/22 0851     Visit Number 8    Number of Visits 12    Date for PT Re-Evaluation 07/12/22    Authorization Type Medicare A&B/ [place]    Progress Note Due on Visit 16    PT Start Time 0845    PT Stop Time 0930    PT Time Calculation (min) 45 min             Past Medical History:  Diagnosis Date   ADHD (attention deficit hyperactivity disorder)    B12 deficiency    BPH (benign prostatic hypertrophy)    Chronic lymphocytic leukemia (CLL), T-cell ([place]) DX 1996--  ONCOLOGIST-  DR Benay Spice   PT IS ASYMPTOMATIC--- LAST CBC W/ DIFF 06-24-2012 STABLE   Coronary artery disease CARDIOLOGIST- DR Daneen Schick   S/P STENTING LAD 1999 // Myoview 01/2019: EF 62, normal perfusion; Low Risk   Crohn's disease of ileum ([place]) [place]   ED (erectile dysfunction)    Elevated PSA    Essential and other specified forms of tremor 11/25/2012   Gait abnormality 05/17/2020   H/O adenomatous polyp of colon    Hyperlipidemia    Hypertension    Nocturia    OA (osteoarthritis)    Peripheral neuropathy    hx of, none current as of 08-04-13   Peyronie disease    S/P coronary artery stent placement OCT 1999 OF LAD   Tremor, hereditary, benign MILD RIGHT HAND   Past Surgical History:  Procedure Laterality Date   CARDIOVASCULAR STRESS TEST  11-01-2010 DR Daneen Schick   NORMAL PERFUSION STUDY/ EF 64%/ NO ISCHEMIA   cataract surgery  Bilateral feburary 2020   with lens placement    COLONOSCOPY WITH PROPOFOL N/A 09/24/2012   Procedure: COLONOSCOPY WITH PROPOFOL;  Surgeon: [name], MD;  Location: [facility];  Service: Endoscopy;  Laterality: N/A;   CORONARY ANGIOPLASTY WITH STENT PLACEMENT  OCT 1999   STENT OF LAD   CORONARY STENT  INTERVENTION N/A 02/14/2019   Procedure: CORONARY STENT INTERVENTION;  Surgeon: Belva Crome, MD;  Location: [facility];  Service: Cardiovascular;  Laterality: N/A;   CYSTOSCOPY WITH URETHRAL DILATATION  03/12/2017   Procedure: CYSTOSCOPY WITH URETHRAL DILATATION;  Surgeon: Gaynelle Arabian, MD;  Location: [facility];  Service: Orthopedics;;  Ammie Dalton, Resident Assisting   CYSTOSCOPY WITH URETHRAL DILATATION N/A 10/03/2018   Procedure: CYSTOSCOPY WITH URETHRAL BALLOON DILATATION WITH BILATERAL RETROGRADE PYELOGRAPHY;  Surgeon: Raynelle Bring, MD;  Location: [facility];  Service: Urology;  Laterality: N/A;   CYSTOSCOPY WITH URETHRAL DILATATION N/A 12/30/2020   Procedure: CYSTOSCOPY WITH BALLOON DILATATION OF URETHRAL STRICTURE;  Surgeon: Raynelle Bring, MD;  Location: [facility];  Service: Urology;  Laterality: N/A;   LAPAROSCOPIC INGUINAL HERNIA REPAIR Bilateral 01-10-2004   W/ MESH   LEFT HEART CATH AND CORONARY ANGIOGRAPHY N/A 02/13/2019   Procedure: LEFT HEART CATH AND CORONARY ANGIOGRAPHY;  Surgeon: Belva Crome, MD;  Location: [facility];  Service: Cardiovascular;  Laterality: N/A;   neck benign removed from neck  yrs ago   PROSTATE BIOPSY N/A 07/12/2012   Procedure: PROSTATE BIOPSY AND ULTRASOUND;  Surgeon: Ailene Rud, MD;  Location: [facility];  Service: Urology;  Laterality: N/A;   PROSTATE SURGERY  2002   tuna   REMOVAL LEFT NECK LYMPH NODE  1996   TONSILLECTOMY  CHILD   TOTAL KNEE ARTHROPLASTY Right 03/12/2017   Procedure: RIGHT TOTAL KNEE ARTHROPLASTY;  Surgeon: Gaynelle Arabian, MD;  Location: [facility];  Service: Orthopedics;  Laterality: Right;  Adductor Block   TRANSURETHRAL RESECTION OF PROSTATE N/A 08/08/2013   Procedure: TRANSURETHRAL RESECTION OF THE PROSTATE WITH GYRUS INSTRUMENTS;  Surgeon: Ailene Rud, MD;  Location: [facility];  Service: Urology;  Laterality: N/A;   TRANSURETHRAL RESECTION OF PROSTATE     Patient Active Problem List   Diagnosis  Date Noted   Adult attention deficit disorder 06/04/2020   Chronic lymphocytic leukemia 06/04/2020   Conductive hearing loss, bilateral 06/04/2020   Crohn's ileitis 06/04/2020   Disorder of musculoskeletal system 06/04/2020   Erectile dysfunction 06/04/2020   Gastroesophageal reflux disease 06/04/2020   Gout 06/04/2020   History of adenomatous polyp of colon 06/04/2020   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   Hypothyroidism 06/04/2020   Increased frequency of urination 06/04/2020   Male hypogonadism 06/04/2020   Muscle pain 06/04/2020   Nocturia 06/04/2020   Obesity 06/04/2020   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   Osteopenia 06/04/2020   Peripheral neurogenic pain 06/04/2020   Pure hypercholesterolemia 06/04/2020   Recurrent falls 06/04/2020   Unspecified kyphosis, thoracic region 06/04/2020   Vitamin B12 deficiency 06/04/2020   Vitamin D deficiency 06/04/2020   Gait abnormality 05/17/2020   Ataxia 03/16/2020   Angina pectoris 02/13/2019   Pseudophakia of both eyes 08/01/2018   History of total knee arthroplasty 03/29/2017   Stiffness of right knee 03/16/2017   OA (osteoarthritis) of knee 03/12/2017   Tremor, essential 12/03/2015   Essential hypertension 12/09/2014   Coronary artery disease involving native heart 12/08/2013   Malignant lymphoma-small cell 12/08/2013   Hyperlipidemia 12/08/2013   Benign prostatic hypertrophy 08/08/2013   Essential and other specified forms of tremor 11/25/2012    ONSET DATE: tremor has been present for years  REFERRING DIAG: R26.89 (ICD-10-CM) - Balance problem G25.0 (ICD-10-CM) - Essential tremor  THERAPY DIAG:  Unsteadiness on feet  Other abnormalities of gait and mobility  Abnormal posture  Muscle weakness (generalized)  History of falling  Rationale for Evaluation and Treatment: Rehabilitation  SUBJECTIVE:                                                                                                                                                                                              SUBJECTIVE STATEMENT: Had a fall/trip walking upstairs carrying case of drinks  Pt accompanied by: self  PERTINENT HISTORY: underlying complex medical history of coronary artery disease with status post stenting, hypertension, hyperlipidemia, elevated PSA, urethral stricture, Crohn's disease, CLL, ADHD, vitamin B12 deficiency, , R TKR, osteoarthritis, neuropathy, and tremor,  PAIN:  Are you having pain? No  PRECAUTIONS: Fall  WEIGHT BEARING RESTRICTIONS: No  FALLS: Has patient fallen in last 6 months? No, reports fall 9 months ago, but fearful of falling  LIVING ENVIRONMENT: Lives with: lives with their spouse Lives in: House/apartment Stairs: Yes: Internal: flight steps; on right going up and External: yes steps; can reach both Has following equipment at home: None  PLOF: Independent  PATIENT GOALS: improve balance, upper body strength. Not willing to use AD  OBJECTIVE:   TODAY'S TREATMENT: 06/06/22 Activity Comments  Dynamic balance warm-up   Weight shift/rocking ant-post On pool noodle at sink  Agility ladder -coordination emphasis  Resisted marching and static hold To improve safety with carrying items up steps  Static balance activities -EO/EC        TODAY'S TREATMENT: 05/24/22 Activity Comments  Recumbent bike x 8 min -hills program for intervals, level 5  Deadlift 2x10 -15# KB  Kettlebell pass around the body -30 sec ea direction on firm/compliant  Farmer's march x 30 sec KB hold, 4# scaption hold  Resisted walking -retrowalk 4x45 ft pulling heavy resistance, HR of 100 bpm -forward walk with resistance with unexpected release for righting reactions  Weighted step-ups 2x10 -15# KB contralat hold            PATIENT EDUCATION: Education details: assessment findings, rationale for PT intervention, HEP initiation Person educated: Patient Education method: Explanation,  Demonstration, and Handouts Education comprehension: verbalized understanding, returned demonstration, and needs further education  HOME EXERCISE PROGRAM: Access Code: [email or web address] URL: [email or web address] Date: 04/10/2022 Prepared by: Sherlyn Lees  Exercises - Standing Balance in [place]  - 1 x daily - 7 x weekly - 3 sets - 30 sec hold - Standing Balance in Corner with Eyes Closed  - 1 x daily - 7 x weekly - 3 sets - 30 sec hold - Corner Balance Feet Together: Eyes Open With Head Turns  - 1 x daily - 7 x weekly - 3 sets - 3-5 reps - Corner Balance Feet Together: Eyes Closed With Head Turns  - 1 x daily - 7 x weekly - 3 sets - 3-5 reps - Seated Cervical Retraction  - 1 x daily - 7 x weekly - 3 sets - 10 reps - 3-5 sec hold - Standing Cervical Retraction  - 1 x daily - 7 x weekly - 3 sets - 10 reps - Supine Cervical Retraction with Towel  - 1 x daily - 7 x weekly - 1-3 sets - 10 reps - 3-5 sec hold - Lat Pull Down  - 1 x daily - 7 x weekly - 3 sets - 10 reps - Seated Row Cable Machine  - 1 x daily - 7 x weekly - 3 sets - 10 reps - Deadlift with Pelvic Contraction  - 1 x daily - 7 x weekly - 3 sets - 10 reps - Full Leg Press  - 1 x daily - 7 x weekly - 3 sets - 10 reps - Seated Chest Press with Dumbbells  - 1 x daily - 7 x weekly - 3 sets - 10 reps  VESTIBULAR ASSESSMENT   GENERAL OBSERVATION: wears progressive lens    SYMPTOM BEHAVIOR:   Subjective history: poor balance   Non-Vestibular symptoms: changes in hearing  Type of dizziness: Unsteady with head/body turns   Frequency: varies   Duration: varies   Aggravating factors:    Relieving factors: slow movements   Progression of symptoms: insidious   OCULOMOTOR EXAM:   Ocular Alignment: normal    Ocular ROM: No Limitations    Spontaneous Nystagmus: absent    Gaze-Induced Nystagmus: absent    Smooth Pursuits: saccades, more towards right tracking    Saccades: hypometric/undershoots and more in a vertical  direction     Convergence/Divergence: 4 cm cm    Horizontal head shaking - negative  Vertical head shaking - induced nystagmus: negative      VESTIBULAR - OCULAR REFLEX:    Slow VOR: Normal   VOR Cancellation: Normal   Head-Impulse Test: HIT Right: positive HIT Left: negative     Dynamic Visual Acuity: Not able to be assessed      POSITIONAL TESTING: NT due to no c/o positional vertigo     MOTION SENSITIVITY:    Motion Sensitivity Quotient  Intensity: 0 = none, 1 = Lightheaded, 2 = Mild, 3 = Moderate, 4 = Severe, 5 = Vomiting  Intensity  1. Sitting to supine   2. Supine to L side   3. Supine to R side   4. Supine to sitting   5. L Hallpike-Dix   6. Up from L    7. R Hallpike-Dix   8. Up from R    9. Sitting, head  tipped to L knee   10. Head up from L  knee   11. Sitting, head  tipped to R knee   12. Head up from R  knee   13. Sitting head turns x5   14.Sitting head nods x5   15. In stance, 180  turn to L    16. In stance, 180  turn to R     OTHOSTATICS: not done    DIAGNOSTIC FINDINGS:   COGNITION: Overall cognitive status: Within functional limits for tasks assessed   SENSATION: Not tested  COORDINATION:   EDEMA:    MUSCLE TONE: resting and action tremor noted, UE > LE   MUSCLE LENGTH: NT  DTRs:    POSTURE: forward head  LOWER EXTREMITY ROM:     Active  Right Eval Left Eval  Hip flexion    Hip extension    Hip abduction    Hip adduction    Hip internal rotation    Hip external rotation    Knee flexion    Knee extension    Ankle dorsiflexion 10 10  Ankle plantarflexion    Ankle inversion    Ankle eversion     (Blank rows = not tested)  LOWER EXTREMITY MMT:    MMT Right Eval Left Eval  Hip flexion 5 4  Hip extension    Hip abduction 5 4  Hip adduction    Hip internal rotation    Hip external rotation    Knee flexion 5 5  Knee extension 5 4  Ankle dorsiflexion 5 5  Ankle plantarflexion    Ankle  inversion    Ankle eversion    (Blank rows = not tested)  BED MOBILITY:  NT  TRANSFERS: Assistive device utilized: None  Sit to stand: Complete Independence Stand to sit: Complete Independence Chair to chair: Complete Independence Floor:  NT  RAMP:  NT  CURB:  NT  STAIRS: Level of Assistance: Modified independence Stair Negotiation Technique: Alternating Pattern  with Single Rail on Right Single Rail on  Left Number of Stairs: 15  Height of Stairs: 4-6  Comments:   GAIT: Gait pattern:  unsteady  tremoring presnet Distance walked:  Assistive device utilized: None Level of assistance: Complete Independence and Modified independence Comments: reduced velocity  FUNCTIONAL TESTS:  Timed up and go (TUG): NT 10 meter walk test: 2.66 ft/sec Berg Balance Scale: NT Functional gait assessment: 16/30  M-CTSIB  Condition 1: Firm Surface, EO 30 Sec, Normal and Mild Sway  Condition 2: Firm Surface, EC 10 Sec, Severe Sway  Condition 3: Foam Surface, EO 20 Sec, Moderate and Severe Sway  Condition 4: Foam Surface, EC 8 Sec, Severe Sway    PATIENT SURVEYS:        GOALS: Goals reviewed with patient? Yes  SHORT TERM GOALS: Target date: 05/01/2022      Patient will be independent in HEP to improve functional outcomes Baseline: Goal status: MET  2.  Demo improved balance and safety with activities such as standing/showering maintaining normal-mild sway per 30 sec condition 2 M-CTSIB Baseline: 10 sec, severe sway/retro LOB Goal status: MET   LONG TERM GOALS: Target date: 07/12/2022      Demo independence with advanced HEP that could include, but not limited to: strength, balance, coordination, posture, and ambulation practices Baseline:  Goal status: IN PROGRESS  2.  Demo 5/5 LLE strength to improve single limb support for improved gait stability Baseline: 4/5 LLE; (05/17/22) 4+/5 Goal status: IN PROGRESS  3.  Demo improved balance and reduced risk for falls per  score 25/30 Functional Gait Assessment Baseline: 16/30; (05/17/22) 20/30 Goal status: IN PROGRESS    ASSESSMENT:  CLINICAL IMPRESSION: Continued with activities to promote coordination and reactive balance to improve body control and reduce risk for falls. Difficulty with crossing midline and sudden change of direction causes LOB.  Continues to exhibit difficulty with narrow BOS and eyes closed conditions and cues/support for high step foot clearance over/around obstacles. Continued sessions indicated to meet POC details  OBJECTIVE IMPAIRMENTS: Abnormal gait, decreased activity tolerance, decreased balance, decreased coordination, decreased knowledge of use of DME, difficulty walking, decreased strength, impaired perceived functional ability, and postural dysfunction.   ACTIVITY LIMITATIONS: carrying, lifting, squatting, stairs, transfers, and locomotion level  PARTICIPATION LIMITATIONS: interpersonal relationship, community activity, and yard work  PERSONAL FACTORS: Age, Time since onset of injury/illness/exacerbation, and 1-2 comorbidities: essential tremor, Right TKR  are also affecting patient's functional outcome.   REHAB POTENTIAL: Good  CLINICAL DECISION MAKING: Evolving/moderate complexity  EVALUATION COMPLEXITY: Moderate  PLAN:  PT FREQUENCY: 1x/week  PT DURATION: 6 weeks  PLANNED INTERVENTIONS: Therapeutic exercises, Therapeutic activity, Neuromuscular re-education, Balance training, Gait training, Patient/Family education, Self Care, Joint mobilization, Stair training, Vestibular training, Canalith repositioning, Orthotic/Fit training, DME instructions, Aquatic Therapy, Dry Needling, Electrical stimulation, Spinal mobilization, Cryotherapy, Moist heat, Taping, Ionotophoresis 4mg /ml Dexamethasone, and Manual therapy  PLAN FOR NEXT SESSION: HEP review, progress balance activities   8:51 AM, 06/06/22 M. Sherlyn Lees, PT, DPT Physical Therapist- [place] Office Number:  [phone]

## 2022-06-14 ENCOUNTER — Ambulatory Visit: Payer: Medicare Other

## 2022-06-14 DIAGNOSIS — R293 Abnormal posture: Secondary | ICD-10-CM

## 2022-06-14 DIAGNOSIS — R2689 Other abnormalities of gait and mobility: Secondary | ICD-10-CM

## 2022-06-14 DIAGNOSIS — M6281 Muscle weakness (generalized): Secondary | ICD-10-CM | POA: Diagnosis not present

## 2022-06-14 DIAGNOSIS — R2681 Unsteadiness on feet: Secondary | ICD-10-CM

## 2022-06-14 DIAGNOSIS — Z9181 History of falling: Secondary | ICD-10-CM | POA: Diagnosis not present

## 2022-06-14 NOTE — Therapy (Signed)
OUTPATIENT PHYSICAL THERAPY NEURO TREATMENT   Patient Name: Cole Martinez MRN: [id number] DOB:1945-10-06, 77 y.o., male Today's Date: 06/14/2022   PCP: Marden Noble, MD REFERRING PROVIDER: Glean Salvo, NP    END OF SESSION:  PT End of Session - 06/14/22 0928     Visit Number 9    Number of Visits 12    Date for PT Re-Evaluation 07/12/22    Authorization Type Medicare A&B/ [place]    Progress Note Due on Visit 16    PT Start Time 0930    PT Stop Time 1015    PT Time Calculation (min) 45 min             Past Medical History:  Diagnosis Date   ADHD (attention deficit hyperactivity disorder)    B12 deficiency    BPH (benign prostatic hypertrophy)    Chronic lymphocytic leukemia (CLL), T-cell (HCC) DX 1996--  ONCOLOGIST-  DR Truett Perna   PT IS ASYMPTOMATIC--- LAST CBC W/ DIFF 06-24-2012 STABLE   Coronary artery disease CARDIOLOGIST- DR Verdis Prime   S/P STENTING LAD 1999 // Myoview 01/2019: EF 62, normal perfusion; Low Risk   Crohn's disease of ileum (HCC) SINCE 1988   ED (erectile dysfunction)    Elevated PSA    Essential and other specified forms of tremor 11/25/2012   Gait abnormality 05/17/2020   H/O adenomatous polyp of colon    Hyperlipidemia    Hypertension    Nocturia    OA (osteoarthritis)    Peripheral neuropathy    hx of, none current as of 08-04-13   Peyronie disease    S/P coronary artery stent placement OCT 1999 OF LAD   Tremor, hereditary, benign MILD RIGHT HAND   Past Surgical History:  Procedure Laterality Date   CARDIOVASCULAR STRESS TEST  11-01-2010 DR Verdis Prime   NORMAL PERFUSION STUDY/ EF 64%/ NO ISCHEMIA   cataract surgery  Bilateral feburary 2020   with lens placement    COLONOSCOPY WITH PROPOFOL N/A 09/24/2012   Procedure: COLONOSCOPY WITH PROPOFOL;  Surgeon: Charolett Bumpers, MD;  Location: [facility];  Service: Endoscopy;  Laterality: N/A;   CORONARY ANGIOPLASTY WITH STENT PLACEMENT  OCT 1999   STENT OF LAD   CORONARY STENT  INTERVENTION N/A 02/14/2019   Procedure: CORONARY STENT INTERVENTION;  Surgeon: Lyn Records, MD;  Location: [facility];  Service: Cardiovascular;  Laterality: N/A;   CYSTOSCOPY WITH URETHRAL DILATATION  03/12/2017   Procedure: CYSTOSCOPY WITH URETHRAL DILATATION;  Surgeon: Ollen Gross, MD;  Location: [facility];  Service: Orthopedics;;  [name], Resident Assisting   CYSTOSCOPY WITH URETHRAL DILATATION N/A 10/03/2018   Procedure: CYSTOSCOPY WITH URETHRAL BALLOON DILATATION WITH BILATERAL RETROGRADE PYELOGRAPHY;  Surgeon: Heloise Purpura, MD;  Location: [facility];  Service: Urology;  Laterality: N/A;   CYSTOSCOPY WITH URETHRAL DILATATION N/A 12/30/2020   Procedure: CYSTOSCOPY WITH BALLOON DILATATION OF URETHRAL STRICTURE;  Surgeon: Heloise Purpura, MD;  Location: [facility];  Service: Urology;  Laterality: N/A;   LAPAROSCOPIC INGUINAL HERNIA REPAIR Bilateral 01-10-2004   W/ MESH   LEFT HEART CATH AND CORONARY ANGIOGRAPHY N/A 02/13/2019   Procedure: LEFT HEART CATH AND CORONARY ANGIOGRAPHY;  Surgeon: Lyn Records, MD;  Location: [facility];  Service: Cardiovascular;  Laterality: N/A;   neck benign removed from neck  yrs ago   PROSTATE BIOPSY N/A 07/12/2012   Procedure: PROSTATE BIOPSY AND ULTRASOUND;  Surgeon: Kathi Ludwig, MD;  Location: [facility];  Service: Urology;  Laterality: N/A;   PROSTATE SURGERY  2002   tuna   REMOVAL LEFT NECK LYMPH NODE  1996   TONSILLECTOMY  CHILD   TOTAL KNEE ARTHROPLASTY Right 03/12/2017   Procedure: RIGHT TOTAL KNEE ARTHROPLASTY;  Surgeon: Ollen GrossAluisio, Frank, MD;  Location: [facility];  Service: Orthopedics;  Laterality: Right;  Adductor Block   TRANSURETHRAL RESECTION OF PROSTATE N/A 08/08/2013   Procedure: TRANSURETHRAL RESECTION OF THE PROSTATE WITH GYRUS INSTRUMENTS;  Surgeon: Kathi LudwigSigmund I Tannenbaum, MD;  Location: [facility];  Service: Urology;  Laterality: N/A;   TRANSURETHRAL RESECTION OF PROSTATE     Patient Active Problem List   Diagnosis  Date Noted   Adult attention deficit disorder 06/04/2020   Chronic lymphocytic leukemia 06/04/2020   Conductive hearing loss, bilateral 06/04/2020   Crohn's ileitis 06/04/2020   Disorder of musculoskeletal system 06/04/2020   Erectile dysfunction 06/04/2020   Gastroesophageal reflux disease 06/04/2020   Gout 06/04/2020   History of adenomatous polyp of colon 06/04/2020   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   Hypothyroidism 06/04/2020   Increased frequency of urination 06/04/2020   Male hypogonadism 06/04/2020   Muscle pain 06/04/2020   Nocturia 06/04/2020   Obesity 06/04/2020   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   Osteopenia 06/04/2020   Peripheral neurogenic pain 06/04/2020   Pure hypercholesterolemia 06/04/2020   Recurrent falls 06/04/2020   Unspecified kyphosis, thoracic region 06/04/2020   Vitamin B12 deficiency 06/04/2020   Vitamin D deficiency 06/04/2020   Gait abnormality 05/17/2020   Ataxia 03/16/2020   Angina pectoris 02/13/2019   Pseudophakia of both eyes 08/01/2018   History of total knee arthroplasty 03/29/2017   Stiffness of right knee 03/16/2017   OA (osteoarthritis) of knee 03/12/2017   Tremor, essential 12/03/2015   Essential hypertension 12/09/2014   Coronary artery disease involving native heart 12/08/2013   Malignant lymphoma-small cell 12/08/2013   Hyperlipidemia 12/08/2013   Benign prostatic hypertrophy 08/08/2013   Essential and other specified forms of tremor 11/25/2012    ONSET DATE: tremor has been present for years  REFERRING DIAG: R26.89 (ICD-10-CM) - Balance problem G25.0 (ICD-10-CM) - Essential tremor  THERAPY DIAG:  Unsteadiness on feet  Other abnormalities of gait and mobility  Abnormal posture  Muscle weakness (generalized)  History of falling  Rationale for Evaluation and Treatment: Rehabilitation  SUBJECTIVE:                                                                                                                                                                                              SUBJECTIVE STATEMENT: Feeling weaker for some reason. Having a hard time lifting.   Pt accompanied by:  self  PERTINENT HISTORY: underlying complex medical history of coronary artery disease with status post stenting, hypertension, hyperlipidemia, elevated PSA, urethral stricture, Crohn's disease, CLL, ADHD, vitamin B12 deficiency, , R TKR, osteoarthritis, neuropathy, and tremor,  PAIN:  Are you having pain? No  PRECAUTIONS: Fall  WEIGHT BEARING RESTRICTIONS: No  FALLS: Has patient fallen in last 6 months? No, reports fall 9 months ago, but fearful of falling  LIVING ENVIRONMENT: Lives with: lives with their spouse Lives in: House/apartment Stairs: Yes: Internal: flight steps; on right going up and External: yes steps; can reach both Has following equipment at home: None  PLOF: Independent  PATIENT GOALS: improve balance, upper body strength. Not willing to use AD  OBJECTIVE:   TODAY'S TREATMENT: 06/14/22 Activity Comments  Dynamic balance warm-up   Postural perturbations on airex pad   Foot on bosu 2x30 sec for SLS  Alt taps on Bosu 2x60 sec 5#  Sidestepping x 2 min 5#, cues for push-off  Deadlift 3x10 25# KB  Suitcase carry 6x25 ft 25#  Push-release for righting reactions  Posterior and anterior  Resisted walking 2x2 min 15-20#    TODAY'S TREATMENT: 06/06/22 Activity Comments  Dynamic balance warm-up   Weight shift/rocking ant-post On pool noodle at sink  Agility ladder -coordination emphasis  Resisted marching and static hold To improve safety with carrying items up steps  Static balance activities -EO/EC        TODAY'S TREATMENT: 05/24/22 Activity Comments  Recumbent bike x 8 min -hills program for intervals, level 5  Deadlift 2x10 -15# KB  Kettlebell pass around the body -30 sec ea direction on firm/compliant  Farmer's march x 30 sec KB hold, 4# scaption hold  Resisted walking  -retrowalk 4x45 ft pulling heavy resistance, HR of 100 bpm -forward walk with resistance with unexpected release for righting reactions  Weighted step-ups 2x10 -15# KB contralat hold            PATIENT EDUCATION: Education details: assessment findings, rationale for PT intervention, HEP initiation Person educated: Patient Education method: Explanation, Demonstration, and Handouts Education comprehension: verbalized understanding, returned demonstration, and needs further education  HOME EXERCISE PROGRAM: Access Code: [email or web address] URL: [email or web address] Date: 04/10/2022 Prepared by: Shary Decamp  Exercises - Standing Balance in Corner  - 1 x daily - 7 x weekly - 3 sets - 30 sec hold - Standing Balance in Corner with Eyes Closed  - 1 x daily - 7 x weekly - 3 sets - 30 sec hold - Corner Balance Feet Together: Eyes Open With Head Turns  - 1 x daily - 7 x weekly - 3 sets - 3-5 reps - Corner Balance Feet Together: Eyes Closed With Head Turns  - 1 x daily - 7 x weekly - 3 sets - 3-5 reps - Seated Cervical Retraction  - 1 x daily - 7 x weekly - 3 sets - 10 reps - 3-5 sec hold - Standing Cervical Retraction  - 1 x daily - 7 x weekly - 3 sets - 10 reps - Supine Cervical Retraction with Towel  - 1 x daily - 7 x weekly - 1-3 sets - 10 reps - 3-5 sec hold - Lat Pull Down  - 1 x daily - 7 x weekly - 3 sets - 10 reps - Seated Row Cable Machine  - 1 x daily - 7 x weekly - 3 sets - 10 reps - Deadlift with Pelvic Contraction  - 1 x daily - 7 x weekly - 3 sets -  10 reps - Full Leg Press  - 1 x daily - 7 x weekly - 3 sets - 10 reps - Seated Chest Press with Dumbbells  - 1 x daily - 7 x weekly - 3 sets - 10 reps  VESTIBULAR ASSESSMENT   GENERAL OBSERVATION: wears progressive lens    SYMPTOM BEHAVIOR:   Subjective history: poor balance   Non-Vestibular symptoms: changes in hearing   Type of dizziness: Unsteady with head/body turns   Frequency: varies   Duration:  varies   Aggravating factors:    Relieving factors: slow movements   Progression of symptoms: insidious   OCULOMOTOR EXAM:   Ocular Alignment: normal    Ocular ROM: No Limitations    Spontaneous Nystagmus: absent    Gaze-Induced Nystagmus: absent    Smooth Pursuits: saccades, more towards right tracking    Saccades: hypometric/undershoots and more in a vertical direction     Convergence/Divergence: 4 cm cm    Horizontal head shaking - negative  Vertical head shaking - induced nystagmus: negative      VESTIBULAR - OCULAR REFLEX:    Slow VOR: Normal   VOR Cancellation: Normal   Head-Impulse Test: HIT Right: positive HIT Left: negative     Dynamic Visual Acuity: Not able to be assessed      POSITIONAL TESTING: NT due to no c/o positional vertigo     MOTION SENSITIVITY:    Motion Sensitivity Quotient  Intensity: 0 = none, 1 = Lightheaded, 2 = Mild, 3 = Moderate, 4 = Severe, 5 = Vomiting  Intensity  1. Sitting to supine   2. Supine to L side   3. Supine to R side   4. Supine to sitting   5. L Hallpike-Dix   6. Up from L    7. R Hallpike-Dix   8. Up from R    9. Sitting, head  tipped to L knee   10. Head up from L  knee   11. Sitting, head  tipped to R knee   12. Head up from R  knee   13. Sitting head turns x5   14.Sitting head nods x5   15. In stance, 180  turn to L    16. In stance, 180  turn to R     OTHOSTATICS: not done    DIAGNOSTIC FINDINGS:   COGNITION: Overall cognitive status: Within functional limits for tasks assessed   SENSATION: Not tested  COORDINATION:   EDEMA:    MUSCLE TONE: resting and action tremor noted, UE > LE   MUSCLE LENGTH: NT  DTRs:    POSTURE: forward head  LOWER EXTREMITY ROM:     Active  Right Eval Left Eval  Hip flexion    Hip extension    Hip abduction    Hip adduction    Hip internal rotation    Hip external rotation    Knee flexion    Knee extension    Ankle dorsiflexion 10 10   Ankle plantarflexion    Ankle inversion    Ankle eversion     (Blank rows = not tested)  LOWER EXTREMITY MMT:    MMT Right Eval Left Eval  Hip flexion 5 4  Hip extension    Hip abduction 5 4  Hip adduction    Hip internal rotation    Hip external rotation    Knee flexion 5 5  Knee extension 5 4  Ankle dorsiflexion 5 5  Ankle plantarflexion    Ankle inversion  Ankle eversion    (Blank rows = not tested)  BED MOBILITY:  NT  TRANSFERS: Assistive device utilized: None  Sit to stand: Complete Independence Stand to sit: Complete Independence Chair to chair: Complete Independence Floor:  NT  RAMP:  NT  CURB:  NT  STAIRS: Level of Assistance: Modified independence Stair Negotiation Technique: Alternating Pattern  with Single Rail on Right Single Rail on Left Number of Stairs: 15  Height of Stairs: 4-6  Comments:   GAIT: Gait pattern:  unsteady  tremoring presnet Distance walked:  Assistive device utilized: None Level of assistance: Complete Independence and Modified independence Comments: reduced velocity  FUNCTIONAL TESTS:  Timed up and go (TUG): NT 10 meter walk test: 2.66 ft/sec Berg Balance Scale: NT Functional gait assessment: 16/30  M-CTSIB  Condition 1: Firm Surface, EO 30 Sec, Normal and Mild Sway  Condition 2: Firm Surface, EC 10 Sec, Severe Sway  Condition 3: Foam Surface, EO 20 Sec, Moderate and Severe Sway  Condition 4: Foam Surface, EC 8 Sec, Severe Sway    PATIENT SURVEYS:        GOALS: Goals reviewed with patient? Yes  SHORT TERM GOALS: Target date: 05/01/2022      Patient will be independent in HEP to improve functional outcomes Baseline: Goal status: MET  2.  Demo improved balance and safety with activities such as standing/showering maintaining normal-mild sway per 30 sec condition 2 M-CTSIB Baseline: 10 sec, severe sway/retro LOB Goal status: MET   LONG TERM GOALS: Target date: 07/12/2022      Demo independence  with advanced HEP that could include, but not limited to: strength, balance, coordination, posture, and ambulation practices Baseline:  Goal status: IN PROGRESS  2.  Demo 5/5 LLE strength to improve single limb support for improved gait stability Baseline: 4/5 LLE; (05/17/22) 4+/5 Goal status: IN PROGRESS  3.  Demo improved balance and reduced risk for falls per score 25/30 Functional Gait Assessment Baseline: 16/30; (05/17/22) 20/30 Goal status: IN PROGRESS    ASSESSMENT:  CLINICAL IMPRESSION: Activities to enhance static and dynamic balance and withstand postural perturbations and external forces to improve safety with navigating in busy environment/crowds.  Difficulty in retro-steps and tendency for retro-LOB as a result foucsing on techniques to improve hip extension and rapid backwards steps to overcome LOB to good effect. Continued sessions to advance POC details and meet STG/LTG. Demo improved strength and control as evidencend by incr deadlift weight and from floor level vs elevation  OBJECTIVE IMPAIRMENTS: Abnormal gait, decreased activity tolerance, decreased balance, decreased coordination, decreased knowledge of use of DME, difficulty walking, decreased strength, impaired perceived functional ability, and postural dysfunction.   ACTIVITY LIMITATIONS: carrying, lifting, squatting, stairs, transfers, and locomotion level  PARTICIPATION LIMITATIONS: interpersonal relationship, community activity, and yard work  PERSONAL FACTORS: Age, Time since onset of injury/illness/exacerbation, and 1-2 comorbidities: essential tremor, Right TKR  are also affecting patient's functional outcome.   REHAB POTENTIAL: Good  CLINICAL DECISION MAKING: Evolving/moderate complexity  EVALUATION COMPLEXITY: Moderate  PLAN:  PT FREQUENCY: 1x/week  PT DURATION: 6 weeks  PLANNED INTERVENTIONS: Therapeutic exercises, Therapeutic activity, Neuromuscular re-education, Balance training, Gait training,  Patient/Family education, Self Care, Joint mobilization, Stair training, Vestibular training, Canalith repositioning, Orthotic/Fit training, DME instructions, Aquatic Therapy, Dry Needling, Electrical stimulation, Spinal mobilization, Cryotherapy, Moist heat, Taping, Ionotophoresis 4mg /ml Dexamethasone, and Manual therapy  PLAN FOR NEXT SESSION: HEP review, progress balance activities   9:28 AM, 06/14/22 M. Shary Decamp, PT, DPT Physical Therapist- [place] Office Number: [phone]

## 2022-06-21 ENCOUNTER — Emergency Department (HOSPITAL_BASED_OUTPATIENT_CLINIC_OR_DEPARTMENT_OTHER)
Admission: EM | Admit: 2022-06-21 | Discharge: 2022-06-21 | Disposition: A | Discharge status: Home / Self Care | Payer: Medicare Other | Attending: Emergency Medicine | Admitting: Emergency Medicine

## 2022-06-21 ENCOUNTER — Other Ambulatory Visit: Payer: Self-pay

## 2022-06-21 ENCOUNTER — Emergency Department (HOSPITAL_BASED_OUTPATIENT_CLINIC_OR_DEPARTMENT_OTHER): Payer: Medicare Other

## 2022-06-21 ENCOUNTER — Encounter (HOSPITAL_BASED_OUTPATIENT_CLINIC_OR_DEPARTMENT_OTHER): Payer: Self-pay

## 2022-06-21 ENCOUNTER — Ambulatory Visit: Payer: Medicare Other

## 2022-06-21 DIAGNOSIS — W01198A Fall on same level from slipping, tripping and stumbling with subsequent striking against other object, initial encounter: Secondary | ICD-10-CM | POA: Insufficient documentation

## 2022-06-21 DIAGNOSIS — I10 Essential (primary) hypertension: Secondary | ICD-10-CM | POA: Diagnosis not present

## 2022-06-21 DIAGNOSIS — M542 Cervicalgia: Secondary | ICD-10-CM | POA: Diagnosis not present

## 2022-06-21 DIAGNOSIS — Z9181 History of falling: Secondary | ICD-10-CM

## 2022-06-21 DIAGNOSIS — S199XXA Unspecified injury of neck, initial encounter: Secondary | ICD-10-CM | POA: Diagnosis not present

## 2022-06-21 DIAGNOSIS — R293 Abnormal posture: Secondary | ICD-10-CM

## 2022-06-21 DIAGNOSIS — M6281 Muscle weakness (generalized): Secondary | ICD-10-CM

## 2022-06-21 DIAGNOSIS — Z23 Encounter for immunization: Secondary | ICD-10-CM | POA: Diagnosis not present

## 2022-06-21 DIAGNOSIS — R2681 Unsteadiness on feet: Secondary | ICD-10-CM

## 2022-06-21 DIAGNOSIS — Y93E6 Activity, residential relocation: Secondary | ICD-10-CM | POA: Diagnosis not present

## 2022-06-21 DIAGNOSIS — Z79899 Other long term (current) drug therapy: Secondary | ICD-10-CM | POA: Diagnosis not present

## 2022-06-21 DIAGNOSIS — Z7982 Long term (current) use of aspirin: Secondary | ICD-10-CM | POA: Diagnosis not present

## 2022-06-21 DIAGNOSIS — S0990XA Unspecified injury of head, initial encounter: Secondary | ICD-10-CM

## 2022-06-21 DIAGNOSIS — S0181XA Laceration without foreign body of other part of head, initial encounter: Secondary | ICD-10-CM | POA: Diagnosis not present

## 2022-06-21 DIAGNOSIS — R2689 Other abnormalities of gait and mobility: Secondary | ICD-10-CM

## 2022-06-21 DIAGNOSIS — M25572 Pain in left ankle and joints of left foot: Secondary | ICD-10-CM | POA: Diagnosis not present

## 2022-06-21 MED ORDER — TETANUS-DIPHTH-ACELL PERTUSSIS 5-2.5-18.5 LF-MCG/0.5 IM SUSY
0.5000 mL | PREFILLED_SYRINGE | Freq: Once | INTRAMUSCULAR | Status: AC
  Administered 2022-06-21: 0.5 mL via INTRAMUSCULAR
  Filled 2022-06-21: qty 0.5

## 2022-06-21 MED ORDER — LIDOCAINE-EPINEPHRINE 1 %-1:100000 IJ SOLN
10.0000 mL | Freq: Once | INTRAMUSCULAR | Status: AC
  Administered 2022-06-21: 10 mL
  Filled 2022-06-21: qty 1

## 2022-06-21 MED ORDER — CEPHALEXIN 500 MG PO CAPS: 500.0000 mg | ORAL_CAPSULE | Freq: Two times a day (BID) | ORAL | 0 refills | Status: DC

## 2022-06-21 NOTE — Therapy (Deleted)
OUTPATIENT PHYSICAL THERAPY NEURO TREATMENT   Patient Name: Cole Martinez MRN: [id number] DOB:[birth year], 77 y.o., male Today's Date: 06/21/2022   PCP: Marden Noble, MD REFERRING PROVIDER: Glean Salvo, NP    END OF SESSION:  PT End of Session - 06/21/22 0934     Visit Number 10    Number of Visits 12    Date for PT Re-Evaluation 07/12/22    Authorization Type Medicare A&B/ [place]    Progress Note Due on Visit 16    PT Start Time 0931    PT Stop Time 1015    PT Time Calculation (min) 44 min             Past Medical History:  Diagnosis Date   ADHD (attention deficit hyperactivity disorder)    B12 deficiency    BPH (benign prostatic hypertrophy)    Chronic lymphocytic leukemia (CLL), T-cell (HCC) DX 1996--  ONCOLOGIST-  DR Truett Perna   PT IS ASYMPTOMATIC--- LAST CBC W/ DIFF 06-24-2012 STABLE   Coronary artery disease CARDIOLOGIST- DR Verdis Prime   S/P STENTING LAD 1999 // Myoview 01/2019: EF 62, normal perfusion; Low Risk   Crohn's disease of ileum (HCC) SINCE 1988   ED (erectile dysfunction)    Elevated PSA    Essential and other specified forms of tremor 11/25/2012   Gait abnormality 05/17/2020   H/O adenomatous polyp of colon    Hyperlipidemia    Hypertension    Nocturia    OA (osteoarthritis)    Peripheral neuropathy    hx of, none current as of 08-04-13   Peyronie disease    S/P coronary artery stent placement OCT 1999 OF LAD   Tremor, hereditary, benign MILD RIGHT HAND   Past Surgical History:  Procedure Laterality Date   CARDIOVASCULAR STRESS TEST  11-01-2010 DR Verdis Prime   NORMAL PERFUSION STUDY/ EF 64%/ NO ISCHEMIA   cataract surgery  Bilateral feburary 2020   with lens placement    COLONOSCOPY WITH PROPOFOL N/A 09/24/2012   Procedure: COLONOSCOPY WITH PROPOFOL;  Surgeon: Charolett Bumpers, MD;  Location: [facility];  Service: Endoscopy;  Laterality: N/A;   CORONARY ANGIOPLASTY WITH STENT PLACEMENT  OCT 1999   STENT OF LAD   CORONARY STENT  INTERVENTION N/A 02/14/2019   Procedure: CORONARY STENT INTERVENTION;  Surgeon: Lyn Records, MD;  Location: [facility];  Service: Cardiovascular;  Laterality: N/A;   CYSTOSCOPY WITH URETHRAL DILATATION  03/12/2017   Procedure: CYSTOSCOPY WITH URETHRAL DILATATION;  Surgeon: Ollen Gross, MD;  Location: [facility];  Service: Orthopedics;;  [name], Resident Assisting   CYSTOSCOPY WITH URETHRAL DILATATION N/A 10/03/2018   Procedure: CYSTOSCOPY WITH URETHRAL BALLOON DILATATION WITH BILATERAL RETROGRADE PYELOGRAPHY;  Surgeon: Heloise Purpura, MD;  Location: [facility];  Service: Urology;  Laterality: N/A;   CYSTOSCOPY WITH URETHRAL DILATATION N/A 12/30/2020   Procedure: CYSTOSCOPY WITH BALLOON DILATATION OF URETHRAL STRICTURE;  Surgeon: Heloise Purpura, MD;  Location: [facility];  Service: Urology;  Laterality: N/A;   LAPAROSCOPIC INGUINAL HERNIA REPAIR Bilateral 01-10-2004   W/ MESH   LEFT HEART CATH AND CORONARY ANGIOGRAPHY N/A 02/13/2019   Procedure: LEFT HEART CATH AND CORONARY ANGIOGRAPHY;  Surgeon: Lyn Records, MD;  Location: [facility];  Service: Cardiovascular;  Laterality: N/A;   neck benign removed from neck  yrs ago   PROSTATE BIOPSY N/A 07/12/2012   Procedure: PROSTATE BIOPSY AND ULTRASOUND;  Surgeon: Kathi Ludwig, MD;  Location: [facility];  Service: Urology;  Laterality: N/A;   PROSTATE SURGERY  2002   tuna   REMOVAL LEFT NECK LYMPH NODE  1996   TONSILLECTOMY  CHILD   TOTAL KNEE ARTHROPLASTY Right 03/12/2017   Procedure: RIGHT TOTAL KNEE ARTHROPLASTY;  Surgeon: Ollen Gross, MD;  Location: [facility];  Service: Orthopedics;  Laterality: Right;  Adductor Block   TRANSURETHRAL RESECTION OF PROSTATE N/A 08/08/2013   Procedure: TRANSURETHRAL RESECTION OF THE PROSTATE WITH GYRUS INSTRUMENTS;  Surgeon: Kathi Ludwig, MD;  Location: [facility];  Service: Urology;  Laterality: N/A;   TRANSURETHRAL RESECTION OF PROSTATE     Patient Active Problem List   Diagnosis  Date Noted   Adult attention deficit disorder 06/04/2020   Chronic lymphocytic leukemia 06/04/2020   Conductive hearing loss, bilateral 06/04/2020   Crohn's ileitis 06/04/2020   Disorder of musculoskeletal system 06/04/2020   Erectile dysfunction 06/04/2020   Gastroesophageal reflux disease 06/04/2020   Gout 06/04/2020   History of adenomatous polyp of colon 06/04/2020   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   Hypothyroidism 06/04/2020   Increased frequency of urination 06/04/2020   Male hypogonadism 06/04/2020   Muscle pain 06/04/2020   Nocturia 06/04/2020   Obesity 06/04/2020   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   Osteopenia 06/04/2020   Peripheral neurogenic pain 06/04/2020   Pure hypercholesterolemia 06/04/2020   Recurrent falls 06/04/2020   Unspecified kyphosis, thoracic region 06/04/2020   Vitamin B12 deficiency 06/04/2020   Vitamin D deficiency 06/04/2020   Gait abnormality 05/17/2020   Ataxia 03/16/2020   Angina pectoris 02/13/2019   Pseudophakia of both eyes 08/01/2018   History of total knee arthroplasty 03/29/2017   Stiffness of right knee 03/16/2017   OA (osteoarthritis) of knee 03/12/2017   Tremor, essential 12/03/2015   Essential hypertension 12/09/2014   Coronary artery disease involving native heart 12/08/2013   Malignant lymphoma-small cell 12/08/2013   Hyperlipidemia 12/08/2013   Benign prostatic hypertrophy 08/08/2013   Essential and other specified forms of tremor 11/25/2012    ONSET DATE: tremor has been present for years  REFERRING DIAG: R26.89 (ICD-10-CM) - Balance problem G25.0 (ICD-10-CM) - Essential tremor  THERAPY DIAG:  Unsteadiness on feet  Other abnormalities of gait and mobility  Abnormal posture  Muscle weakness (generalized)  History of falling  Rationale for Evaluation and Treatment: Rehabilitation  SUBJECTIVE:                                                                                                                                                                                              SUBJECTIVE STATEMENT: Feeling weaker for some reason. Having a hard time lifting.   Pt accompanied by:  self  PERTINENT HISTORY: underlying complex medical history of coronary artery disease with status post stenting, hypertension, hyperlipidemia, elevated PSA, urethral stricture, Crohn's disease, CLL, ADHD, vitamin B12 deficiency, , R TKR, osteoarthritis, neuropathy, and tremor,  PAIN:  Are you having pain? No  PRECAUTIONS: Fall  WEIGHT BEARING RESTRICTIONS: No  FALLS: Has patient fallen in last 6 months? No, reports fall 9 months ago, but fearful of falling  LIVING ENVIRONMENT: Lives with: lives with their spouse Lives in: House/apartment Stairs: Yes: Internal: flight steps; on right going up and External: yes steps; can reach both Has following equipment at home: None  PLOF: Independent  PATIENT GOALS: improve balance, upper body strength. Not willing to use AD  OBJECTIVE:   TODAY'S TREATMENT: 06/14/22 Activity Comments  Dynamic balance warm-up   Postural perturbations on airex pad   Foot on bosu 2x30 sec for SLS  Alt taps on Bosu 2x60 sec 5#  Sidestepping x 2 min 5#, cues for push-off  Deadlift 3x10 25# KB  Suitcase carry 6x25 ft 25#  Push-release for righting reactions  Posterior and anterior  Resisted walking 2x2 min 15-20#    TODAY'S TREATMENT: 06/06/22 Activity Comments  Dynamic balance warm-up   Weight shift/rocking ant-post On pool noodle at sink  Agility ladder -coordination emphasis  Resisted marching and static hold To improve safety with carrying items up steps  Static balance activities -EO/EC        TODAY'S TREATMENT: 05/24/22 Activity Comments  Recumbent bike x 8 min -hills program for intervals, level 5  Deadlift 2x10 -15# KB  Kettlebell pass around the body -30 sec ea direction on firm/compliant  Farmer's march x 30 sec KB hold, 4# scaption hold  Resisted walking  -retrowalk 4x45 ft pulling heavy resistance, HR of 100 bpm -forward walk with resistance with unexpected release for righting reactions  Weighted step-ups 2x10 -15# KB contralat hold            PATIENT EDUCATION: Education details: assessment findings, rationale for PT intervention, HEP initiation Person educated: Patient Education method: Explanation, Demonstration, and Handouts Education comprehension: verbalized understanding, returned demonstration, and needs further education  HOME EXERCISE PROGRAM: Access Code: [email or web address] URL: [email or web address] Date: 04/10/2022 Prepared by: Shary Decamp  Exercises - Standing Balance in Corner  - 1 x daily - 7 x weekly - 3 sets - 30 sec hold - Standing Balance in Corner with Eyes Closed  - 1 x daily - 7 x weekly - 3 sets - 30 sec hold - Corner Balance Feet Together: Eyes Open With Head Turns  - 1 x daily - 7 x weekly - 3 sets - 3-5 reps - Corner Balance Feet Together: Eyes Closed With Head Turns  - 1 x daily - 7 x weekly - 3 sets - 3-5 reps - Seated Cervical Retraction  - 1 x daily - 7 x weekly - 3 sets - 10 reps - 3-5 sec hold - Standing Cervical Retraction  - 1 x daily - 7 x weekly - 3 sets - 10 reps - Supine Cervical Retraction with Towel  - 1 x daily - 7 x weekly - 1-3 sets - 10 reps - 3-5 sec hold - Lat Pull Down  - 1 x daily - 7 x weekly - 3 sets - 10 reps - Seated Row Cable Machine  - 1 x daily - 7 x weekly - 3 sets - 10 reps - Deadlift with Pelvic Contraction  - 1 x daily - 7 x weekly - 3 sets -  10 reps - Full Leg Press  - 1 x daily - 7 x weekly - 3 sets - 10 reps - Seated Chest Press with Dumbbells  - 1 x daily - 7 x weekly - 3 sets - 10 reps  VESTIBULAR ASSESSMENT   GENERAL OBSERVATION: wears progressive lens    SYMPTOM BEHAVIOR:   Subjective history: poor balance   Non-Vestibular symptoms: changes in hearing   Type of dizziness: Unsteady with head/body turns   Frequency: varies   Duration:  varies   Aggravating factors:    Relieving factors: slow movements   Progression of symptoms: insidious   OCULOMOTOR EXAM:   Ocular Alignment: normal    Ocular ROM: No Limitations    Spontaneous Nystagmus: absent    Gaze-Induced Nystagmus: absent    Smooth Pursuits: saccades, more towards right tracking    Saccades: hypometric/undershoots and more in a vertical direction     Convergence/Divergence: 4 cm cm    Horizontal head shaking - negative  Vertical head shaking - induced nystagmus: negative      VESTIBULAR - OCULAR REFLEX:    Slow VOR: Normal   VOR Cancellation: Normal   Head-Impulse Test: HIT Right: positive HIT Left: negative     Dynamic Visual Acuity: Not able to be assessed      POSITIONAL TESTING: NT due to no c/o positional vertigo     MOTION SENSITIVITY:    Motion Sensitivity Quotient  Intensity: 0 = none, 1 = Lightheaded, 2 = Mild, 3 = Moderate, 4 = Severe, 5 = Vomiting  Intensity  1. Sitting to supine   2. Supine to L side   3. Supine to R side   4. Supine to sitting   5. L Hallpike-Dix   6. Up from L    7. R Hallpike-Dix   8. Up from R    9. Sitting, head  tipped to L knee   10. Head up from L  knee   11. Sitting, head  tipped to R knee   12. Head up from R  knee   13. Sitting head turns x5   14.Sitting head nods x5   15. In stance, 180  turn to L    16. In stance, 180  turn to R     OTHOSTATICS: not done    DIAGNOSTIC FINDINGS:   COGNITION: Overall cognitive status: Within functional limits for tasks assessed   SENSATION: Not tested  COORDINATION:   EDEMA:    MUSCLE TONE: resting and action tremor noted, UE > LE   MUSCLE LENGTH: NT  DTRs:    POSTURE: forward head  LOWER EXTREMITY ROM:     Active  Right Eval Left Eval  Hip flexion    Hip extension    Hip abduction    Hip adduction    Hip internal rotation    Hip external rotation    Knee flexion    Knee extension    Ankle dorsiflexion 10 10   Ankle plantarflexion    Ankle inversion    Ankle eversion     (Blank rows = not tested)  LOWER EXTREMITY MMT:    MMT Right Eval Left Eval  Hip flexion 5 4  Hip extension    Hip abduction 5 4  Hip adduction    Hip internal rotation    Hip external rotation    Knee flexion 5 5  Knee extension 5 4  Ankle dorsiflexion 5 5  Ankle plantarflexion    Ankle inversion  Ankle eversion    (Blank rows = not tested)  BED MOBILITY:  NT  TRANSFERS: Assistive device utilized: None  Sit to stand: Complete Independence Stand to sit: Complete Independence Chair to chair: Complete Independence Floor:  NT  RAMP:  NT  CURB:  NT  STAIRS: Level of Assistance: Modified independence Stair Negotiation Technique: Alternating Pattern  with Single Rail on Right Single Rail on Left Number of Stairs: 15  Height of Stairs: 4-6  Comments:   GAIT: Gait pattern:  unsteady  tremoring presnet Distance walked:  Assistive device utilized: None Level of assistance: Complete Independence and Modified independence Comments: reduced velocity  FUNCTIONAL TESTS:  Timed up and go (TUG): NT 10 meter walk test: 2.66 ft/sec Berg Balance Scale: NT Functional gait assessment: 16/30  M-CTSIB  Condition 1: Firm Surface, EO 30 Sec, Normal and Mild Sway  Condition 2: Firm Surface, EC 10 Sec, Severe Sway  Condition 3: Foam Surface, EO 20 Sec, Moderate and Severe Sway  Condition 4: Foam Surface, EC 8 Sec, Severe Sway    PATIENT SURVEYS:        GOALS: Goals reviewed with patient? Yes  SHORT TERM GOALS: Target date: 05/01/2022      Patient will be independent in HEP to improve functional outcomes Baseline: Goal status: MET  2.  Demo improved balance and safety with activities such as standing/showering maintaining normal-mild sway per 30 sec condition 2 M-CTSIB Baseline: 10 sec, severe sway/retro LOB Goal status: MET   LONG TERM GOALS: Target date: 07/12/2022      Demo independence  with advanced HEP that could include, but not limited to: strength, balance, coordination, posture, and ambulation practices Baseline:  Goal status: IN PROGRESS  2.  Demo 5/5 LLE strength to improve single limb support for improved gait stability Baseline: 4/5 LLE; (05/17/22) 4+/5 Goal status: IN PROGRESS  3.  Demo improved balance and reduced risk for falls per score 25/30 Functional Gait Assessment Baseline: 16/30; (05/17/22) 20/30 Goal status: IN PROGRESS    ASSESSMENT:  CLINICAL IMPRESSION: Activities to enhance static and dynamic balance and withstand postural perturbations and external forces to improve safety with navigating in busy environment/crowds.  Difficulty in retro-steps and tendency for retro-LOB as a result foucsing on techniques to improve hip extension and rapid backwards steps to overcome LOB to good effect. Continued sessions to advance POC details and meet STG/LTG. Demo improved strength and control as evidencend by incr deadlift weight and from floor level vs elevation  OBJECTIVE IMPAIRMENTS: Abnormal gait, decreased activity tolerance, decreased balance, decreased coordination, decreased knowledge of use of DME, difficulty walking, decreased strength, impaired perceived functional ability, and postural dysfunction.   ACTIVITY LIMITATIONS: carrying, lifting, squatting, stairs, transfers, and locomotion level  PARTICIPATION LIMITATIONS: interpersonal relationship, community activity, and yard work  PERSONAL FACTORS: Age, Time since onset of injury/illness/exacerbation, and 1-2 comorbidities: essential tremor, Right TKR  are also affecting patient's functional outcome.   REHAB POTENTIAL: Good  CLINICAL DECISION MAKING: Evolving/moderate complexity  EVALUATION COMPLEXITY: Moderate  PLAN:  PT FREQUENCY: 1x/week  PT DURATION: 6 weeks  PLANNED INTERVENTIONS: Therapeutic exercises, Therapeutic activity, Neuromuscular re-education, Balance training, Gait training,  Patient/Family education, Self Care, Joint mobilization, Stair training, Vestibular training, Canalith repositioning, Orthotic/Fit training, DME instructions, Aquatic Therapy, Dry Needling, Electrical stimulation, Spinal mobilization, Cryotherapy, Moist heat, Taping, Ionotophoresis 4mg /ml Dexamethasone, and Manual therapy  PLAN FOR NEXT SESSION: HEP review, progress balance activities   9:34 AM, 06/21/22 M. Shary Decamp, PT, DPT Physical Therapist- [facility] Office Number: [phone]

## 2022-06-21 NOTE — Discharge Instructions (Addendum)
Today you had a facial wound, which was sewn, with absorbable sutures, this will absorb over time.  Check for redness, swelling, warmth of the area if he sees any signs, there may be an infection and you need an antibiotic and need to seek medical care.  Please do not get the sutures wet for for the first 24 hours, you can use a shower To cover that.  If you have a severe headache, intractable nausea, vomiting, please return to the ED.  Your CT today was reassuring.  As well as your tetanus was updated.

## 2022-06-21 NOTE — ED Provider Notes (Signed)
[place] EMERGENCY DEPARTMENT AT [facility] Provider Note   CSN: [id number] Arrival date & time: 06/21/22  1617     History  Chief Complaint  Patient presents with   Cole Martinez is a 77 y.o. male, history of hypertension, who presents to the ED secondary to a fall that occurred about an hour ago.  He states he was putting on some new orthopedic shoes, and slipped, and fell and hit his face.  No loss of consciousness, but is on aspirin, but no Eliquis, Coumadin, Plavix.  Denies any chest pain, shortness of breath, or dizziness prior to the fall.  Unknown last tetanus. Reports a little bit of the neck pain.  Denies any other pain or trauma.     Home Medications Prior to Admission medications   Medication Sig Start Date End Date Taking? Authorizing Provider  cephALEXin (KEFLEX) 500 MG capsule Take 1 capsule (500 mg total) by mouth 2 (two) times daily. 06/21/22  Yes Dalinda Heidt L, PA  acetaminophen (TYLENOL) 500 MG tablet Take 1,000 mg by mouth at bedtime as needed for mild pain (joint pain).     [provider]  amphetamine-dextroamphetamine (ADDERALL) 5 MG tablet Take 1 tablet (5 mg total) by mouth 2 (two) times daily. 12/20/21     aspirin EC 81 MG tablet Take 1 tablet (81 mg total) by mouth daily. Swallow whole. 12/17/20   Lyn Records, MD  calcium citrate (CALCITRATE - DOSED IN MG ELEMENTAL CALCIUM) 950 (200 Ca) MG tablet Take 200 mg of elemental calcium by mouth daily.    [provider]  Cholecalciferol 25 MCG (1000 UT) tablet Take 1,000 Units by mouth daily.    [provider]  Clobetasol Prop Emollient Base (CLOBETASOL PROPIONATE E) 0.05 % emollient cream Apply 1 application topically 2 (two) times daily. 04/27/21   Janalyn Harder, MD  Coenzyme Q10 (CO Q-10) 200 MG CAPS Take 200 mg by mouth daily.     [provider]  Cyanocobalamin (VITAMIN B-12) 2500 MCG SUBL Take 2,000 mcg by mouth daily. Reports taking 2,000 mcg  capsule    [provider]  diclofenac Sodium (VOLTAREN) 1 % GEL Apply 1 application topically 4 (four) times daily as needed (pain).    [provider]  famotidine (PEPCID) 20 MG tablet Take 20 mg by mouth every morning.    [provider]  ketoconazole (NIZORAL) 2 % cream Apply 1 Application topically daily. 04/28/22   [provider]  metoprolol succinate (TOPROL-XL) 25 MG 24 hr tablet TAKE 1 TABLET BY MOUTH ONCE DAILY 02/09/21   Lyn Records, MD  pantoprazole (PROTONIX) 40 MG tablet TAKE 1 TABLET BY MOUTH  DAILY 01/20/21   Lyn Records, MD  primidone (MYSOLINE) 50 MG tablet Take 1.5 tablets (75 mg total) by mouth at bedtime. 04/04/22   Glean Salvo, NP  rosuvastatin (CRESTOR) 20 MG tablet Take 1 tablet (20 mg total) by mouth daily. 05/09/22   Orbie Pyo, MD      Allergies    Patient has no known allergies.    Review of Systems   Review of Systems  Respiratory:  Negative for shortness of breath.   Cardiovascular:  Negative for chest pain.  Skin:  Positive for wound.    Physical Exam Updated Vital Signs BP (!) 155/80 (BP Location: Right Arm)   Pulse 78   Temp 98 F (36.7 C) (Oral)   Resp 18   Ht 5'  6" (1.676 m)   Wt 79.4 kg   SpO2 100%   BMI 28.25 kg/m  Physical Exam Vitals and nursing note reviewed.  Constitutional:      General: He is not in acute distress.    Appearance: He is well-developed.  HENT:     Head: Normocephalic and atraumatic.     Right Ear: Tympanic membrane normal.     Left Ear: Tympanic membrane normal.     Nose:     Right Nostril: No septal hematoma.     Left Nostril: No septal hematoma.  Eyes:     Conjunctiva/sclera: Conjunctivae normal.  Neck:     Comments: Mild tenderness to the cervical spine Cardiovascular:     Rate and Rhythm: Normal rate and regular rhythm.     Heart sounds: No murmur heard. Pulmonary:     Effort: Pulmonary effort is normal. No respiratory distress.     Breath sounds: Normal  breath sounds.  Abdominal:     Palpations: Abdomen is soft.     Tenderness: There is no abdominal tenderness.  Musculoskeletal:        General: No swelling.     Cervical back: Neck supple.  Skin:    General: Skin is warm and dry.     Capillary Refill: Capillary refill takes less than 2 seconds.     Comments: 2 1.5 cm lacerations to middle forehead  Neurological:     Mental Status: He is alert.  Psychiatric:        Mood and Affect: Mood normal.     ED Results / Procedures / Treatments   Labs (all labs ordered are listed, but only abnormal results are displayed) Labs Reviewed - No data to display  EKG None  Radiology CT Head Wo Contrast  Result Date: 06/21/2022 CLINICAL DATA:  Head trauma, minor (Age >= 65y); Facial trauma, blunt; Neck trauma (Age >= 65y) EXAM: CT HEAD WITHOUT CONTRAST CT MAXILLOFACIAL WITHOUT CONTRAST CT CERVICAL SPINE WITHOUT CONTRAST TECHNIQUE: Multidetector CT imaging of the head, cervical spine, and maxillofacial structures were performed using the standard protocol without intravenous contrast. Multiplanar CT image reconstructions of the cervical spine and maxillofacial structures were also generated. RADIATION DOSE REDUCTION: This exam was performed according to the departmental dose-optimization program which includes automated exposure control, adjustment of the mA and/or kV according to patient size and/or use of iterative reconstruction technique. COMPARISON:  None Available. FINDINGS: CT HEAD FINDINGS Brain: Cerebral ventricle sizes are concordant with the degree of cerebral volume loss. Patchy and confluent areas of decreased attenuation are noted throughout the deep and periventricular white matter of the cerebral hemispheres bilaterally, compatible with chronic microvascular ischemic disease. No evidence of large-territorial acute infarction. No parenchymal hemorrhage. No mass lesion. No extra-axial collection. No mass effect or midline shift. No  hydrocephalus. Basilar cisterns are patent. Vascular: No hyperdense vessel. Atherosclerotic calcifications are present within the cavernous internal carotid arteries. Skull: No acute fracture or focal lesion. Other: None. CT MAXILLOFACIAL FINDINGS Osseous: No fracture or mandibular dislocation. No destructive process. Bilateral temporomandibular joint degenerative changes. Sinuses/Orbits: Paranasal sinuses and mastoid air cells are clear. Bilateral lens replacement. Otherwise the orbits are unremarkable. Soft tissues: Negative. CT CERVICAL SPINE FINDINGS Alignment: Grade 1 anterolisthesis of C3 on C4 and C4 on C5. Skull base and vertebrae: Multilevel moderate severe degenerative changes of the spine with associated C3-C4 and C4-C5 bilateral severe osseous neural foraminal stenosis. No severe osseous central canal stenosis. No acute fracture. No aggressive appearing focal osseous lesion or  focal pathologic process. Soft tissues and spinal canal: No prevertebral fluid or swelling. No visible canal hematoma. Upper chest: Unremarkable. Other: Atherosclerotic plaque of the aortic arch. IMPRESSION: 1. No acute intracranial abnormality. 2. No acute displaced facial fracture. 3. No acute displaced fracture or traumatic listhesis of the cervical spine. 4. Grade 1 anterolisthesis of C3 on C4 and C4 on C5 and associated multilevel degenerative changes spine leading to C3-C4 and C4-C5 severe osseous neural foraminal stenosis bilaterally. Electronically Signed   By: Tish Frederickson M.D.   On: 06/21/2022 19:28   CT Cervical Spine Wo Contrast  Result Date: 06/21/2022 CLINICAL DATA:  Head trauma, minor (Age >= 65y); Facial trauma, blunt; Neck trauma (Age >= 65y) EXAM: CT HEAD WITHOUT CONTRAST CT MAXILLOFACIAL WITHOUT CONTRAST CT CERVICAL SPINE WITHOUT CONTRAST TECHNIQUE: Multidetector CT imaging of the head, cervical spine, and maxillofacial structures were performed using the standard protocol without intravenous contrast.  Multiplanar CT image reconstructions of the cervical spine and maxillofacial structures were also generated. RADIATION DOSE REDUCTION: This exam was performed according to the departmental dose-optimization program which includes automated exposure control, adjustment of the mA and/or kV according to patient size and/or use of iterative reconstruction technique. COMPARISON:  None Available. FINDINGS: CT HEAD FINDINGS Brain: Cerebral ventricle sizes are concordant with the degree of cerebral volume loss. Patchy and confluent areas of decreased attenuation are noted throughout the deep and periventricular white matter of the cerebral hemispheres bilaterally, compatible with chronic microvascular ischemic disease. No evidence of large-territorial acute infarction. No parenchymal hemorrhage. No mass lesion. No extra-axial collection. No mass effect or midline shift. No hydrocephalus. Basilar cisterns are patent. Vascular: No hyperdense vessel. Atherosclerotic calcifications are present within the cavernous internal carotid arteries. Skull: No acute fracture or focal lesion. Other: None. CT MAXILLOFACIAL FINDINGS Osseous: No fracture or mandibular dislocation. No destructive process. Bilateral temporomandibular joint degenerative changes. Sinuses/Orbits: Paranasal sinuses and mastoid air cells are clear. Bilateral lens replacement. Otherwise the orbits are unremarkable. Soft tissues: Negative. CT CERVICAL SPINE FINDINGS Alignment: Grade 1 anterolisthesis of C3 on C4 and C4 on C5. Skull base and vertebrae: Multilevel moderate severe degenerative changes of the spine with associated C3-C4 and C4-C5 bilateral severe osseous neural foraminal stenosis. No severe osseous central canal stenosis. No acute fracture. No aggressive appearing focal osseous lesion or focal pathologic process. Soft tissues and spinal canal: No prevertebral fluid or swelling. No visible canal hematoma. Upper chest: Unremarkable. Other: Atherosclerotic  plaque of the aortic arch. IMPRESSION: 1. No acute intracranial abnormality. 2. No acute displaced facial fracture. 3. No acute displaced fracture or traumatic listhesis of the cervical spine. 4. Grade 1 anterolisthesis of C3 on C4 and C4 on C5 and associated multilevel degenerative changes spine leading to C3-C4 and C4-C5 severe osseous neural foraminal stenosis bilaterally. Electronically Signed   By: Tish Frederickson M.D.   On: 06/21/2022 19:28   CT Maxillofacial Wo Contrast  Result Date: 06/21/2022 CLINICAL DATA:  Head trauma, minor (Age >= 65y); Facial trauma, blunt; Neck trauma (Age >= 65y) EXAM: CT HEAD WITHOUT CONTRAST CT MAXILLOFACIAL WITHOUT CONTRAST CT CERVICAL SPINE WITHOUT CONTRAST TECHNIQUE: Multidetector CT imaging of the head, cervical spine, and maxillofacial structures were performed using the standard protocol without intravenous contrast. Multiplanar CT image reconstructions of the cervical spine and maxillofacial structures were also generated. RADIATION DOSE REDUCTION: This exam was performed according to the departmental dose-optimization program which includes automated exposure control, adjustment of the mA and/or kV according to patient size and/or use of iterative reconstruction  technique. COMPARISON:  None Available. FINDINGS: CT HEAD FINDINGS Brain: Cerebral ventricle sizes are concordant with the degree of cerebral volume loss. Patchy and confluent areas of decreased attenuation are noted throughout the deep and periventricular white matter of the cerebral hemispheres bilaterally, compatible with chronic microvascular ischemic disease. No evidence of large-territorial acute infarction. No parenchymal hemorrhage. No mass lesion. No extra-axial collection. No mass effect or midline shift. No hydrocephalus. Basilar cisterns are patent. Vascular: No hyperdense vessel. Atherosclerotic calcifications are present within the cavernous internal carotid arteries. Skull: No acute fracture or  focal lesion. Other: None. CT MAXILLOFACIAL FINDINGS Osseous: No fracture or mandibular dislocation. No destructive process. Bilateral temporomandibular joint degenerative changes. Sinuses/Orbits: Paranasal sinuses and mastoid air cells are clear. Bilateral lens replacement. Otherwise the orbits are unremarkable. Soft tissues: Negative. CT CERVICAL SPINE FINDINGS Alignment: Grade 1 anterolisthesis of C3 on C4 and C4 on C5. Skull base and vertebrae: Multilevel moderate severe degenerative changes of the spine with associated C3-C4 and C4-C5 bilateral severe osseous neural foraminal stenosis. No severe osseous central canal stenosis. No acute fracture. No aggressive appearing focal osseous lesion or focal pathologic process. Soft tissues and spinal canal: No prevertebral fluid or swelling. No visible canal hematoma. Upper chest: Unremarkable. Other: Atherosclerotic plaque of the aortic arch. IMPRESSION: 1. No acute intracranial abnormality. 2. No acute displaced facial fracture. 3. No acute displaced fracture or traumatic listhesis of the cervical spine. 4. Grade 1 anterolisthesis of C3 on C4 and C4 on C5 and associated multilevel degenerative changes spine leading to C3-C4 and C4-C5 severe osseous neural foraminal stenosis bilaterally. Electronically Signed   By: Tish Frederickson M.D.   On: 06/21/2022 19:28    Procedures .Marland KitchenLaceration Repair  Date/Time: 06/21/2022 7:41 PM  Performed by: Pete Pelt, PA Authorized by: Pete Pelt, PA   Consent:    Consent obtained:  Verbal   Consent given by:  Patient   Risks, benefits, and alternatives were discussed: yes     Risks discussed:  Infection, pain, need for additional repair, poor cosmetic result, nerve damage and poor wound healing   Alternatives discussed:  No treatment Universal protocol:    Patient identity confirmed:  Verbally with patient Anesthesia:    Anesthesia method:  Local infiltration   Local anesthetic:  Lidocaine 1% WITH  epi Laceration details:    Location:  Face   Face location:  Forehead   Length (cm):  1.5 Treatment:    Area cleansed with:  Chlorhexidine   Amount of cleaning:  Standard   Irrigation solution:  Sterile saline   Irrigation method:  Pressure wash Skin repair:    Repair method:  Sutures   Suture size:  5-0   Suture material:  Fast-absorbing gut   Number of sutures:  3 Approximation:    Approximation:  Close Repair type:    Repair type:  Simple Post-procedure details:    Dressing:  Non-adherent dressing   Procedure completion:  Tolerated .Marland KitchenLaceration Repair  Date/Time: 06/21/2022 7:42 PM  Performed by: Pete Pelt, PA Authorized by: Pete Pelt, PA   Consent:    Consent obtained:  Verbal   Consent given by:  Patient   Risks discussed:  Infection, pain, need for additional repair, poor cosmetic result, nerve damage, vascular damage, poor wound healing and retained foreign body   Alternatives discussed:  No treatment Universal protocol:    Patient identity confirmed:  Verbally with patient Anesthesia:    Anesthesia method:  Local infiltration   Local anesthetic:  Lidocaine 1% WITH epi Laceration details:    Location:  Face   Face location:  Forehead   Length (cm):  1.5 Pre-procedure details:    Preparation:  Patient was prepped and draped in usual sterile fashion Treatment:    Area cleansed with:  Chlorhexidine   Amount of cleaning:  Standard   Irrigation solution:  Sterile saline   Irrigation method:  Pressure wash Skin repair:    Repair method:  Sutures   Suture size:  5-0   Suture material:  Fast-absorbing gut   Suture technique:  Simple interrupted   Number of sutures:  3 Approximation:    Approximation:  Close Repair type:    Repair type:  Simple Post-procedure details:    Dressing:  Non-adherent dressing     Medications Ordered in ED Medications  Tdap (BOOSTRIX) injection 0.5 mL (0.5 mLs Intramuscular Given 06/21/22 1953)   lidocaine-EPINEPHrine (XYLOCAINE W/EPI) 1 %-1:100000 (with pres) injection 10 mL (10 mLs Other Given 06/21/22 1939)    ED Course/ Medical Decision Making/ A&P                             Medical Decision Making Patient is a 77 year old male, here for a fall that occurred today when he was trying to put on orthopedic shoe when he tripped over it.  Is on baby aspirin, but denies any warfarin, Eliquis, Xarelto use.  He had no loss of consciousness.  Did not have any chest pain or shortness of breath prior to the fall, complains of facial facial pain, and headache and slight neck pain.  We will obtain a CT of his face, head, and neck for further evaluation.  He denies any other injuries.  Additionally his tetanus is unknown, thus we will administer another given his wound.  He will require stitching as he does have to 1.5 cm lacerations to his mid forehead he has some superficial abrasions as well.  Amount and/or Complexity of Data Reviewed Radiology: ordered.    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 Discussion of management or test interpretation with external provider(s): Discussed with patient, he has no septal hematomas, negative Battle sign.  Tetanus is updated and he has 6 stitches total.  They are absorbable.  He was put on prophylactic Keflex because he is going out of the country, and will not be able to receive any medical care for possible infections that may develop.  Discharged with strict return precautions patient voiced understanding.  Risk Prescription drug management.   Final Clinical Impression(s) / ED Diagnoses Final diagnoses:  Injury of head, initial encounter  Facial laceration, initial encounter    Rx / DC Orders ED Discharge Orders          Ordered    cephALEXin (KEFLEX) 500 MG capsule  2 times daily        06/21/22 2001              Pete Pelt, [place] 06/21/22 2319    Jacalyn Lefevre, MD 06/22/22 1505

## 2022-06-21 NOTE — ED Notes (Signed)
Laceration noted to LT side forehead

## 2022-06-21 NOTE — ED Triage Notes (Signed)
Pt arrives to ED POV C/O Fall and head injury. Per pt he was packing and tripped, hitting a door. Denies LOC. Pt has lacteration to forehead, Bleeding controlled. No other complaints at this time. Pt A/O x4.

## 2022-06-21 NOTE — Therapy (Signed)
[place] [facility] [address], [address] [place], [place], [address] Phone: [phone]   Fax:  [phone]  Patient Details  Name: Cole Martinez MRN: [id number] Date of Birth: [birth year] Referring Provider:  Marden Noble, MD  Encounter Date: 06/21/2022  Pt reports walking yesterday and experiencing sudden onset of dorsal, lateral left foot pain.  Examination reveals redness and tenderness to palpation along the area and pain with resisted eversion and pain with stretch into inversion.  Pt has upcoming trip out of country and requests hold tx to allow him to visit with Ortho MD for further assessment and intervention.     Dion Body, PT 06/21/2022, 9:47 AM  [place] [facility] [address] [place], [place], [address] Phone: [phone]   Fax:  [phone]

## 2022-07-12 ENCOUNTER — Ambulatory Visit: Payer: Medicare Other | Attending: Internal Medicine

## 2022-07-12 ENCOUNTER — Ambulatory Visit: Payer: Medicare Other | Attending: Neurology

## 2022-07-12 DIAGNOSIS — R2689 Other abnormalities of gait and mobility: Secondary | ICD-10-CM | POA: Diagnosis not present

## 2022-07-12 DIAGNOSIS — I25119 Atherosclerotic heart disease of native coronary artery with unspecified angina pectoris: Secondary | ICD-10-CM | POA: Diagnosis not present

## 2022-07-12 DIAGNOSIS — R2681 Unsteadiness on feet: Secondary | ICD-10-CM | POA: Insufficient documentation

## 2022-07-12 DIAGNOSIS — E782 Mixed hyperlipidemia: Secondary | ICD-10-CM

## 2022-07-12 DIAGNOSIS — M6281 Muscle weakness (generalized): Secondary | ICD-10-CM | POA: Diagnosis not present

## 2022-07-12 DIAGNOSIS — Z9181 History of falling: Secondary | ICD-10-CM | POA: Insufficient documentation

## 2022-07-12 DIAGNOSIS — R293 Abnormal posture: Secondary | ICD-10-CM | POA: Diagnosis not present

## 2022-07-12 LAB — LIPID PANEL
Chol/HDL Ratio: 2.7 ratio (ref 0.0–5.0)
Cholesterol, Total: 145 mg/dL (ref 100–199)
HDL: 54 mg/dL (ref >39)
LDL Chol Calc (NIH): 71 mg/dL (ref 0–99)
Triglycerides: 111 mg/dL (ref 0–149)
VLDL Cholesterol Cal: 20 mg/dL (ref 5–40)

## 2022-07-12 LAB — HEPATIC FUNCTION PANEL
ALT: 17 IU/L (ref 0–44)
AST: 28 IU/L (ref 0–40)
Albumin: 4.3 g/dL (ref 3.8–4.8)
Alkaline Phosphatase: 75 IU/L (ref 44–121)
Bilirubin Total: 0.5 mg/dL (ref 0.0–1.2)
Bilirubin, Direct: 0.18 mg/dL (ref 0.00–0.40)
Total Protein: 6.5 g/dL (ref 6.0–8.5)

## 2022-07-12 NOTE — Therapy (Deleted)
[place] [facility] [address], [address] [place], [place], [address] Phone: [phone]   Fax:  [phone]  Patient Details  Name: Cole Martinez MRN: [id number] Date of Birth: 03-08-1945 Referring Provider:  Marden Noble, MD  Encounter Date: 07/12/2022  Pt reports walking yesterday and experiencing sudden onset of dorsal, lateral left foot pain.  Examination reveals redness and tenderness to palpation along the area and pain with resisted eversion and pain with stretch into inversion.  Pt has upcoming trip out of country and requests hold tx to allow him to visit with Ortho MD for further assessment and intervention.     Dion Body, PT 07/12/2022, 9:38 AM  [place] [facility] [address], [address] [place], [place], [address] Phone: [phone]   Fax:  [phone]

## 2022-07-12 NOTE — Therapy (Signed)
OUTPATIENT PHYSICAL THERAPY NEURO TREATMENT, Progress Note, and Recertification   Patient Name: Cole Martinez MRN: [id number] DOB:[birth year], 77 y.o., male Today's Date: 07/12/2022   PCP: Marden Noble, MD REFERRING PROVIDER: Glean Salvo, NP   Progress Note Reporting Period 05/17/22 to 07/12/22  See note below for Objective Data and Assessment of Progress/Goals.     END OF SESSION:  PT End of Session - 07/12/22 0938     Visit Number 10    Number of Visits 18    Date for PT Re-Evaluation 09/06/22    Authorization Type Medicare A&B/ [place]    Progress Note Due on Visit 20    PT Start Time 0930    PT Stop Time 1015    PT Time Calculation (min) 45 min             Past Medical History:  Diagnosis Date   ADHD (attention deficit hyperactivity disorder)    B12 deficiency    BPH (benign prostatic hypertrophy)    Chronic lymphocytic leukemia (CLL), T-cell (HCC) DX 1996--  ONCOLOGIST-  DR Truett Perna   PT IS ASYMPTOMATIC--- LAST CBC W/ DIFF 06-24-2012 STABLE   Coronary artery disease CARDIOLOGIST- DR Verdis Prime   S/P STENTING LAD 1999 // Myoview 01/2019: EF 62, normal perfusion; Low Risk   Crohn's disease of ileum (HCC) SINCE 1988   ED (erectile dysfunction)    Elevated PSA    Essential and other specified forms of tremor 11/25/2012   Gait abnormality 05/17/2020   H/O adenomatous polyp of colon    Hyperlipidemia    Hypertension    Nocturia    OA (osteoarthritis)    Peripheral neuropathy    hx of, none current as of 08-04-13   Peyronie disease    S/P coronary artery stent placement OCT 1999 OF LAD   Tremor, hereditary, benign MILD RIGHT HAND   Past Surgical History:  Procedure Laterality Date   CARDIOVASCULAR STRESS TEST  11-01-2010 DR Verdis Prime   NORMAL PERFUSION STUDY/ EF 64%/ NO ISCHEMIA   cataract surgery  Bilateral feburary 2020   with lens placement    COLONOSCOPY WITH PROPOFOL N/A 09/24/2012   Procedure: COLONOSCOPY WITH PROPOFOL;  Surgeon: Charolett Bumpers,  MD;  Location: [facility];  Service: Endoscopy;  Laterality: N/A;   CORONARY ANGIOPLASTY WITH STENT PLACEMENT  OCT 1999   STENT OF LAD   CORONARY STENT INTERVENTION N/A 02/14/2019   Procedure: CORONARY STENT INTERVENTION;  Surgeon: Lyn Records, MD;  Location: [facility];  Service: Cardiovascular;  Laterality: N/A;   CYSTOSCOPY WITH URETHRAL DILATATION  03/12/2017   Procedure: CYSTOSCOPY WITH URETHRAL DILATATION;  Surgeon: Ollen Gross, MD;  Location: [facility];  Service: Orthopedics;;  [name], Resident Assisting   CYSTOSCOPY WITH URETHRAL DILATATION N/A 10/03/2018   Procedure: CYSTOSCOPY WITH URETHRAL BALLOON DILATATION WITH BILATERAL RETROGRADE PYELOGRAPHY;  Surgeon: Heloise Purpura, MD;  Location: [facility];  Service: Urology;  Laterality: N/A;   CYSTOSCOPY WITH URETHRAL DILATATION N/A 12/30/2020   Procedure: CYSTOSCOPY WITH BALLOON DILATATION OF URETHRAL STRICTURE;  Surgeon: Heloise Purpura, MD;  Location: [facility];  Service: Urology;  Laterality: N/A;   LAPAROSCOPIC INGUINAL HERNIA REPAIR Bilateral 01-10-2004   W/ MESH   LEFT HEART CATH AND CORONARY ANGIOGRAPHY N/A 02/13/2019   Procedure: LEFT HEART CATH AND CORONARY ANGIOGRAPHY;  Surgeon: Lyn Records, MD;  Location: [facility];  Service: Cardiovascular;  Laterality: N/A;   neck benign removed from neck  yrs ago   PROSTATE  BIOPSY N/A 07/12/2012   Procedure: PROSTATE BIOPSY AND ULTRASOUND;  Surgeon: Kathi Ludwig, MD;  Location[facility];  Service: Urology;  Laterality: N/A;   PROSTATE SURGERY  2002   tuna   REMOVAL LEFT NECK LYMPH NODE  1996   TONSILLECTOMY  CHILD   TOTAL KNEE ARTHROPLASTY Right 03/12/2017   Procedure: RIGHT TOTAL KNEE ARTHROPLASTY;  Surgeon: Ollen Gross, MD;  Location: [facility];  Service: Orthopedics;  Laterality: Right;  Adductor Block   TRANSURETHRAL RESECTION OF PROSTATE N/A 08/08/2013   Procedure: TRANSURETHRAL RESECTION OF THE PROSTATE WITH GYRUS INSTRUMENTS;  Surgeon: Kathi Ludwig, MD;  Location: [facility];  Service: Urology;  Laterality: N/A;   TRANSURETHRAL RESECTION OF PROSTATE     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   Hypothyroidism 06/04/2020   Increased frequency of urination 06/04/2020   Male hypogonadism 06/04/2020   Muscle pain 06/04/2020   Nocturia 06/04/2020   Obesity 06/04/2020   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   Osteopenia 06/04/2020   Peripheral neurogenic pain 06/04/2020   Pure hypercholesterolemia 06/04/2020   Recurrent falls 06/04/2020   Unspecified kyphosis, thoracic region 06/04/2020   Vitamin B12 deficiency 06/04/2020   Vitamin D deficiency 06/04/2020   Gait abnormality 05/17/2020   Ataxia 03/16/2020   Angina pectoris (HCC) 02/13/2019   Pseudophakia of both eyes 08/01/2018   History of total knee arthroplasty 03/29/2017   Stiffness of right knee 03/16/2017   OA (osteoarthritis) of knee 03/12/2017   Tremor, essential 12/03/2015   Essential hypertension 12/09/2014   Coronary artery disease involving native heart 12/08/2013   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 Having a hard time lifting.   Pt accompanied by: self  PERTINENT HISTORY: underlying complex medical history of coronary artery disease with status post stenting, hypertension, hyperlipidemia, elevated PSA, urethral stricture, Crohn's disease, CLL, ADHD, vitamin B12 deficiency, , R TKR, osteoarthritis, neuropathy, and tremor,  PAIN:  Are you having pain? No  PRECAUTIONS: Fall  WEIGHT BEARING RESTRICTIONS: No  FALLS: Has patient fallen in last 6 months? No, reports fall 9 months ago, but fearful of falling  LIVING ENVIRONMENT: Lives with: lives with their spouse Lives in: House/apartment Stairs: Yes: Internal: flight steps; on right going up and External: yes steps; can reach both Has following equipment at home: None  PLOF: Independent  PATIENT GOALS: improve balance, upper body strength. Not willing to use AD  OBJECTIVE:   TODAY'S TREATMENT: 07/12/22 Activity Comments  FGA 18/30  Mini-BESTest 15/28  Vestibular assessment               TODAY'S TREATMENT: 06/14/22 Activity Comments  Dynamic balance warm-up   Postural perturbations on airex pad   Foot on bosu 2x30 sec for SLS  Alt taps on Bosu 2x60 sec 5#  Sidestepping x 2 min 5#, cues for push-off  Deadlift 3x10 25# KB  Suitcase carry 6x25 ft 25#  Push-release for righting reactions  Posterior and anterior  Resisted walking 2x2 min 15-20#       PATIENT EDUCATION: Education details: assessment findings, rationale for PT intervention, HEP initiation Person educated: Patient Education method: Explanation, Demonstration, and Handouts Education comprehension: verbalized understanding, returned  demonstration, and needs further education  HOME EXERCISE PROGRAM: Access Code: [email or web address] URL: [email or web address] Date: 04/10/2022 Prepared by: Shary Decamp  Exercises - Standing Balance in Corner  - 1 x daily - 7 x weekly - 3 sets - 30 sec hold - Standing Balance in Corner with Eyes Closed  - 1 x daily - 7 x weekly - 3 sets - 30 sec hold - Corner Balance Feet Together: Eyes Open With Head Turns  - 1 x daily - 7 x weekly - 3 sets - 3-5 reps - Corner Balance Feet Together: Eyes Closed With Head Turns  - 1 x daily - 7 x weekly - 3 sets - 3-5 reps - Seated Cervical Retraction  - 1 x daily - 7 x weekly - 3 sets - 10 reps - 3-5 sec hold - Standing Cervical Retraction  - 1 x daily - 7 x weekly - 3 sets - 10 reps - Supine Cervical Retraction with Towel  - 1 x daily - 7 x weekly - 1-3 sets - 10 reps - 3-5 sec hold - Lat Pull Down  - 1 x daily - 7 x weekly - 3 sets - 10 reps - Seated Row Cable Machine  - 1 x daily - 7 x weekly - 3 sets - 10 reps - Deadlift with Pelvic Contraction  - 1 x daily - 7 x weekly - 3 sets - 10 reps - Full Leg Press  - 1 x daily - 7 x weekly - 3 sets - 10 reps - Seated Chest Press with Dumbbells  - 1 x daily - 7 x weekly - 3 sets - 10 reps  VESTIBULAR ASSESSMENT   GENERAL OBSERVATION: wears progressive lens    SYMPTOM BEHAVIOR:   Subjective history: poor balance   Non-Vestibular symptoms: changes in hearing   Type of dizziness: Unsteady with head/body turns   Frequency: varies   Duration: varies  Aggravating factors:    Relieving factors: slow movements   Progression of symptoms: insidious   OCULOMOTOR EXAM:   Ocular Alignment: normal    Ocular ROM: No Limitations    Spontaneous Nystagmus: absent    Gaze-Induced Nystagmus: absent    Smooth Pursuits: saccades, more towards right tracking    Saccades: overshoots left> right     Convergence/Divergence: 8 cm    Head Impulse Test: difficult to assess due to neck tension, possibly +HIT to  the left>right      VESTIBULAR - OCULAR REFLEX:    Slow VOR: Normal   VOR Cancellation: Normal      Dynamic Visual Acuity: Not able to be assessed      POSITIONAL TESTING: NT due to no c/o positional vertigo         DIAGNOSTIC FINDINGS:   COGNITION: Overall cognitive status: Within functional limits for tasks assessed   SENSATION: Not tested  COORDINATION:   EDEMA:    MUSCLE TONE: resting and action tremor noted, UE > LE   MUSCLE LENGTH: NT  DTRs:    POSTURE: forward head  LOWER EXTREMITY ROM:     Active  Right Eval Left Eval  Hip flexion    Hip extension    Hip abduction    Hip adduction    Hip internal rotation    Hip external rotation    Knee flexion    Knee extension    Ankle dorsiflexion 10 10  Ankle plantarflexion    Ankle inversion    Ankle eversion     (Blank rows = not tested)  LOWER EXTREMITY MMT:    MMT Right Eval Left Eval  Hip flexion 5 4  Hip extension    Hip abduction 5 4  Hip adduction    Hip internal rotation    Hip external rotation    Knee flexion 5 5  Knee extension 5 4  Ankle dorsiflexion 5 5  Ankle plantarflexion    Ankle inversion    Ankle eversion    (Blank rows = not tested)  BED MOBILITY:  NT  TRANSFERS: Assistive device utilized: None  Sit to stand: Complete Independence Stand to sit: Complete Independence Chair to chair: Complete Independence Floor:  NT  RAMP:  NT  CURB:  NT  STAIRS: Level of Assistance: Modified independence Stair Negotiation Technique: Alternating Pattern  with Single Rail on Right Single Rail on Left Number of Stairs: 15  Height of Stairs: 4-6  Comments:   GAIT: Gait pattern:  unsteady  tremoring presnet Distance walked:  Assistive device utilized: None Level of assistance: Complete Independence and Modified independence Comments: reduced velocity  FUNCTIONAL TESTS:  Timed up and go (TUG): NT 10 meter walk test: 2.66 ft/sec Berg Balance Scale:  NT Functional gait assessment: 16/30  M-CTSIB  Condition 1: Firm Surface, EO 30 Sec, Normal and Mild Sway  Condition 2: Firm Surface, EC 10 Sec, Severe Sway  Condition 3: Foam Surface, EO 20 Sec, Moderate and Severe Sway  Condition 4: Foam Surface, EC 8 Sec, Severe Sway    PATIENT SURVEYS:        GOALS: Goals reviewed with patient? Yes  SHORT TERM GOALS: Target date: 08/09/2022     Patient will be independent in HEP to improve functional outcomes Baseline: Goal status: MET  2.  Demo improved balance and safety with activities such as standing/showering maintaining normal-mild sway per 30 sec condition 2 M-CTSIB Baseline: 10 sec, severe sway/retro LOB Goal status: MET  3.  Demo improved balance and safety with activities such as standing/showering maintaining normal-mild sway per 30 sec condition 4 M-CTSIB  Baseline: UNABLE  Goal status: IN PROGRESS  LONG TERM GOALS: Target date: 09/06/2022        Demo independence with advanced HEP that could include, but not limited to: strength, balance, coordination, posture, and ambulation practices Baseline:  Goal status: IN PROGRESS  2.  Demo 5/5 LLE strength to improve single limb support for improved gait stability Baseline: 4/5 LLE; (05/17/22) 4+/5 Goal status: IN PROGRESS  3.  Demo improved balance and reduced risk for falls per score 25/30 Functional Gait Assessment Baseline: 16/30; (05/17/22) 20/30; 18/30 (07/12/22) Goal status: IN PROGRESS   4. Demo improved balance and postural control per score 22/28 Mini-BESTest  Baseline: 15/28  Goal status: INITIAL    ASSESSMENT:  CLINICAL IMPRESSION: Returns to clinic with recent hx of falls and striking head which was evaluated in ED and no red flag signs/symptoms noted.  Pt reports increased balance disturbance since that time and testing/outcome measures verify worse postural and balance control per FGA and Mini-BESTest scores indicating high risk for falls.  Postural  instability very pronounced with narrow BOS and eyes closed conditions w/ mod-severe-LOB sway with conditions 2 and 4 M-CTSIB.  Vestibular assessment performed with notable for overshooting in saccades left > right and possibly +Head Impulse Test but this was difficult to determine due to lack of frenzel lenses and pt limited cervical ROM.  Continued sessions indicated to provide relevant interventions and compensatory/adpative strategies to reduce risk for falls and improve mobility/quality of life.   OBJECTIVE IMPAIRMENTS: Abnormal gait, decreased activity tolerance, decreased balance, decreased coordination, decreased knowledge of use of DME, difficulty walking, decreased strength, impaired perceived functional ability, and postural dysfunction.   ACTIVITY LIMITATIONS: carrying, lifting, squatting, stairs, transfers, and locomotion level  PARTICIPATION LIMITATIONS: interpersonal relationship, community activity, and yard work  PERSONAL FACTORS: Age, Time since onset of injury/illness/exacerbation, and 1-2 comorbidities: essential tremor, Right TKR  are also affecting patient's functional outcome.   REHAB POTENTIAL: Good  CLINICAL DECISION MAKING: Evolving/moderate complexity  EVALUATION COMPLEXITY: Moderate  PLAN:  PT FREQUENCY: 1x/week  PT DURATION: 6 weeks  PLANNED INTERVENTIONS: Therapeutic exercises, Therapeutic activity, Neuromuscular re-education, Balance training, Gait training, Patient/Family education, Self Care, Joint mobilization, Stair training, Vestibular training, Canalith repositioning, Orthotic/Fit training, DME instructions, Aquatic Therapy, Dry Needling, Electrical stimulation, Spinal mobilization, Cryotherapy, Moist heat, Taping, Ionotophoresis 4mg /ml Dexamethasone, and Manual therapy  PLAN FOR NEXT SESSION: HEP review, progress balance activities   10:29 AM, 07/12/22 M. Shary Decamp, PT, DPT Physical Therapist- [place] Office Number: [phone]

## 2022-07-13 ENCOUNTER — Telehealth: Payer: Self-pay | Admitting: Pharmacist

## 2022-07-13 DIAGNOSIS — I25119 Atherosclerotic heart disease of native coronary artery with unspecified angina pectoris: Secondary | ICD-10-CM

## 2022-07-13 DIAGNOSIS — E7800 Pure hypercholesterolemia, unspecified: Secondary | ICD-10-CM

## 2022-07-13 MED ORDER — EZETIMIBE 10 MG PO TABS: 10.0000 mg | ORAL_TABLET | Freq: Every day | ORAL | 3 refills | Status: DC

## 2022-07-13 NOTE — Addendum Note (Signed)
Addended by: Malena Peer D on: 07/13/2022 10:45 AM   Modules accepted: Orders

## 2022-07-13 NOTE — Telephone Encounter (Signed)
I spoke with patient about his labs. LDL-C 71. Doing well on rosuvastatin 20mg  daily. Targeting a lower LDL-C due to slightly elevated Lp(a). We discussed ezetimibe and patient is willing to add. Continue rosuvastatin 20mg  daily and add ezetimibe 10mg  daily. Labs scheduled for 7/11.

## 2022-07-19 ENCOUNTER — Other Ambulatory Visit: Payer: Self-pay | Admitting: Internal Medicine

## 2022-07-19 ENCOUNTER — Ambulatory Visit: Payer: Medicare Other | Admitting: Physical Therapy

## 2022-07-19 ENCOUNTER — Ambulatory Visit
Admission: RE | Admit: 2022-07-19 | Discharge: 2022-07-19 | Disposition: A | Discharge status: Home / Self Care | Payer: Medicare Other | Source: Ambulatory Visit | Attending: Internal Medicine | Admitting: Internal Medicine

## 2022-07-19 DIAGNOSIS — E230 Hypopituitarism: Secondary | ICD-10-CM | POA: Diagnosis not present

## 2022-07-19 DIAGNOSIS — F900 Attention-deficit hyperactivity disorder, predominantly inattentive type: Secondary | ICD-10-CM | POA: Diagnosis not present

## 2022-07-19 DIAGNOSIS — R062 Wheezing: Secondary | ICD-10-CM | POA: Diagnosis not present

## 2022-07-19 DIAGNOSIS — I2510 Atherosclerotic heart disease of native coronary artery without angina pectoris: Secondary | ICD-10-CM | POA: Diagnosis not present

## 2022-07-19 DIAGNOSIS — E669 Obesity, unspecified: Secondary | ICD-10-CM | POA: Diagnosis not present

## 2022-07-19 DIAGNOSIS — J00 Acute nasopharyngitis [common cold]: Secondary | ICD-10-CM | POA: Diagnosis not present

## 2022-07-19 DIAGNOSIS — C9110 Chronic lymphocytic leukemia of B-cell type not having achieved remission: Secondary | ICD-10-CM | POA: Diagnosis not present

## 2022-07-19 DIAGNOSIS — R052 Subacute cough: Secondary | ICD-10-CM

## 2022-07-19 DIAGNOSIS — K219 Gastro-esophageal reflux disease without esophagitis: Secondary | ICD-10-CM | POA: Diagnosis not present

## 2022-07-19 DIAGNOSIS — Z86010 Personal history of colonic polyps: Secondary | ICD-10-CM | POA: Diagnosis not present

## 2022-07-19 DIAGNOSIS — R7309 Other abnormal glucose: Secondary | ICD-10-CM | POA: Diagnosis not present

## 2022-07-19 DIAGNOSIS — R053 Chronic cough: Secondary | ICD-10-CM | POA: Diagnosis not present

## 2022-07-20 ENCOUNTER — Ambulatory Visit: Payer: Medicare Other

## 2022-07-20 DIAGNOSIS — R293 Abnormal posture: Secondary | ICD-10-CM

## 2022-07-20 DIAGNOSIS — R2689 Other abnormalities of gait and mobility: Secondary | ICD-10-CM | POA: Diagnosis not present

## 2022-07-20 DIAGNOSIS — Z9181 History of falling: Secondary | ICD-10-CM

## 2022-07-20 DIAGNOSIS — M6281 Muscle weakness (generalized): Secondary | ICD-10-CM | POA: Diagnosis not present

## 2022-07-20 DIAGNOSIS — R2681 Unsteadiness on feet: Secondary | ICD-10-CM | POA: Diagnosis not present

## 2022-07-20 NOTE — Therapy (Signed)
OUTPATIENT PHYSICAL THERAPY NEURO TREATMENT   Patient Name: Cole Martinez MRN: [id number] DOB:05/05/1945, 77 y.o., male Today's Date: 07/20/2022   PCP: Marden Noble, MD REFERRING PROVIDER: Glean Salvo, NP    END OF SESSION:  PT End of Session - 07/20/22 0850     Visit Number 11    Number of Visits 18    Date for PT Re-Evaluation 09/06/22    Authorization Type Medicare A&B/ [place]    Progress Note Due on Visit 20    PT Start Time 0845    PT Stop Time 0930    PT Time Calculation (min) 45 min             Past Medical History:  Diagnosis Date   ADHD (attention deficit hyperactivity disorder)    B12 deficiency    BPH (benign prostatic hypertrophy)    Chronic lymphocytic leukemia (CLL), T-cell (HCC) DX 1996--  ONCOLOGIST-  DR Truett Perna   PT IS ASYMPTOMATIC--- LAST CBC W/ DIFF 06-24-2012 STABLE   Coronary artery disease CARDIOLOGIST- DR Verdis Prime   S/P STENTING LAD 1999 // Myoview 01/2019: EF 62, normal perfusion; Low Risk   Crohn's disease of ileum (HCC) SINCE 1988   ED (erectile dysfunction)    Elevated PSA    Essential and other specified forms of tremor 11/25/2012   Gait abnormality 05/17/2020   H/O adenomatous polyp of colon    Hyperlipidemia    Hypertension    Nocturia    OA (osteoarthritis)    Peripheral neuropathy    hx of, none current as of 08-04-13   Peyronie disease    S/P coronary artery stent placement OCT 1999 OF LAD   Tremor, hereditary, benign MILD RIGHT HAND   Past Surgical History:  Procedure Laterality Date   CARDIOVASCULAR STRESS TEST  11-01-2010 DR Verdis Prime   NORMAL PERFUSION STUDY/ EF 64%/ NO ISCHEMIA   cataract surgery  Bilateral feburary 2020   with lens placement    COLONOSCOPY WITH PROPOFOL N/A 09/24/2012   Procedure: COLONOSCOPY WITH PROPOFOL;  Surgeon: Charolett Bumpers, MD;  Location: [facility];  Service: Endoscopy;  Laterality: N/A;   CORONARY ANGIOPLASTY WITH STENT PLACEMENT  OCT 1999   STENT OF LAD   CORONARY STENT  INTERVENTION N/A 02/14/2019   Procedure: CORONARY STENT INTERVENTION;  Surgeon: Lyn Records, MD;  Location: [facility];  Service: Cardiovascular;  Laterality: N/A;   CYSTOSCOPY WITH URETHRAL DILATATION  03/12/2017   Procedure: CYSTOSCOPY WITH URETHRAL DILATATION;  Surgeon: Ollen Gross, MD;  Location: [facility];  Service: Orthopedics;;  [name], Resident Assisting   CYSTOSCOPY WITH URETHRAL DILATATION N/A 10/03/2018   Procedure: CYSTOSCOPY WITH URETHRAL BALLOON DILATATION WITH BILATERAL RETROGRADE PYELOGRAPHY;  Surgeon: Heloise Purpura, MD;  Location: [facility];  Service: Urology;  Laterality: N/A;   CYSTOSCOPY WITH URETHRAL DILATATION N/A 12/30/2020   Procedure: CYSTOSCOPY WITH BALLOON DILATATION OF URETHRAL STRICTURE;  Surgeon: Heloise Purpura, MD;  Location: [facility];  Service: Urology;  Laterality: N/A;   LAPAROSCOPIC INGUINAL HERNIA REPAIR Bilateral 01-10-2004   W/ MESH   LEFT HEART CATH AND CORONARY ANGIOGRAPHY N/A 02/13/2019   Procedure: LEFT HEART CATH AND CORONARY ANGIOGRAPHY;  Surgeon: Lyn Records, MD;  Location: [facility];  Service: Cardiovascular;  Laterality: N/A;   neck benign removed from neck  yrs ago   PROSTATE BIOPSY N/A 07/12/2012   Procedure: PROSTATE BIOPSY AND ULTRASOUND;  Surgeon: Kathi Ludwig, MD;  Location: [facility];  Service: Urology;  Laterality: N/A;   PROSTATE SURGERY  2002   tuna   REMOVAL LEFT NECK LYMPH NODE  1996   TONSILLECTOMY  CHILD   TOTAL KNEE ARTHROPLASTY Right 03/12/2017   Procedure: RIGHT TOTAL KNEE ARTHROPLASTY;  Surgeon: Ollen Gross, MD;  Location: [facility];  Service: Orthopedics;  Laterality: Right;  Adductor Block   TRANSURETHRAL RESECTION OF PROSTATE N/A 08/08/2013   Procedure: TRANSURETHRAL RESECTION OF THE PROSTATE WITH GYRUS INSTRUMENTS;  Surgeon: Kathi Ludwig, MD;  Location: [facility];  Service: Urology;  Laterality: N/A;   TRANSURETHRAL RESECTION OF PROSTATE     Patient Active Problem List   Diagnosis  Date Noted   Adult attention deficit disorder 06/04/2020   Chronic lymphocytic leukemia (HCC) 06/04/2020   Conductive hearing loss, bilateral 06/04/2020   Crohn's ileitis (HCC) 06/04/2020   Disorder of musculoskeletal system 06/04/2020   Erectile dysfunction 06/04/2020   Gastroesophageal reflux disease 06/04/2020   Gout 06/04/2020   History of adenomatous polyp of colon 06/04/2020   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   Hypothyroidism 06/04/2020   Increased frequency of urination 06/04/2020   Male hypogonadism 06/04/2020   Muscle pain 06/04/2020   Nocturia 06/04/2020   Obesity 06/04/2020   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   Osteopenia 06/04/2020   Peripheral neurogenic pain 06/04/2020   Pure hypercholesterolemia 06/04/2020   Recurrent falls 06/04/2020   Unspecified kyphosis, thoracic region 06/04/2020   Vitamin B12 deficiency 06/04/2020   Vitamin D deficiency 06/04/2020   Gait abnormality 05/17/2020   Ataxia 03/16/2020   Angina pectoris (HCC) 02/13/2019   Pseudophakia of both eyes 08/01/2018   History of total knee arthroplasty 03/29/2017   Stiffness of right knee 03/16/2017   OA (osteoarthritis) of knee 03/12/2017   Tremor, essential 12/03/2015   Essential hypertension 12/09/2014   Coronary artery disease involving native heart 12/08/2013   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   Hyperlipidemia 12/08/2013   Benign prostatic hypertrophy 08/08/2013   Essential and other specified forms of tremor 11/25/2012    ONSET DATE: tremor has been present for years  REFERRING DIAG: R26.89 (ICD-10-CM) - Balance problem G25.0 (ICD-10-CM) - Essential tremor  THERAPY DIAG:  Unsteadiness on feet  Other abnormalities of gait and mobility  Abnormal posture  Muscle weakness (generalized)  History of falling  Rationale for Evaluation and Treatment: Rehabilitation  SUBJECTIVE:                                                                                                                                                                                              SUBJECTIVE STATEMENT: Feeling ok, no news to report   Pt accompanied  by: self  PERTINENT HISTORY: underlying complex medical history of coronary artery disease with status post stenting, hypertension, hyperlipidemia, elevated PSA, urethral stricture, Crohn's disease, CLL, ADHD, vitamin B12 deficiency, , R TKR, osteoarthritis, neuropathy, and tremor,  PAIN:  Are you having pain? No  PRECAUTIONS: Fall  WEIGHT BEARING RESTRICTIONS: No  FALLS: Has patient fallen in last 6 months? No, reports fall 9 months ago, but fearful of falling  LIVING ENVIRONMENT: Lives with: lives with their spouse Lives in: House/apartment Stairs: Yes: Internal: flight steps; on right going up and External: yes steps; can reach both Has following equipment at home: None  PLOF: Independent  PATIENT GOALS: improve balance, upper body strength. Not willing to use AD  OBJECTIVE:    TODAY'S TREATMENT: 07/20/22 Activity Comments  NU-step speed intervals x 8 min For coordination  LE lift-off 2x10 6" box, 10# weight for hip abd/SLS  Modified deadlift 2x10 6" box, 15#  Suitcase carry 6x25 ft 15#  Sit-stand 1x5 -on foam -eyes closed -alt march  Standing on foam EO w/ postural perturbations x 60 sec -EC -trunk twists -bicep curls  -condition 4 M-CTSIB: 14 sec best  Push-release Multi-direction for righting reactions  4-square step For SLS and managing obstacles  Vitals: 144/82, 72 bpm          PATIENT EDUCATION: Education details: assessment findings, rationale for PT intervention, HEP initiation Person educated: Patient Education method: Explanation, Demonstration, and Handouts Education comprehension: verbalized understanding, returned demonstration, and needs further education  HOME EXERCISE PROGRAM: Access Code: [email or web address] URL: [email or web address] Date:  04/10/2022 Prepared by: Shary Decamp  Exercises - Standing Balance in Corner  - 1 x daily - 7 x weekly - 3 sets - 30 sec hold - Standing Balance in Corner with Eyes Closed  - 1 x daily - 7 x weekly - 3 sets - 30 sec hold - Corner Balance Feet Together: Eyes Open With Head Turns  - 1 x daily - 7 x weekly - 3 sets - 3-5 reps - Corner Balance Feet Together: Eyes Closed With Head Turns  - 1 x daily - 7 x weekly - 3 sets - 3-5 reps - Seated Cervical Retraction  - 1 x daily - 7 x weekly - 3 sets - 10 reps - 3-5 sec hold - Standing Cervical Retraction  - 1 x daily - 7 x weekly - 3 sets - 10 reps - Supine Cervical Retraction with Towel  - 1 x daily - 7 x weekly - 1-3 sets - 10 reps - 3-5 sec hold - Lat Pull Down  - 1 x daily - 7 x weekly - 3 sets - 10 reps - Seated Row Cable Machine  - 1 x daily - 7 x weekly - 3 sets - 10 reps - Deadlift with Pelvic Contraction  - 1 x daily - 7 x weekly - 3 sets - 10 reps - Full Leg Press  - 1 x daily - 7 x weekly - 3 sets - 10 reps - Seated Chest Press with Dumbbells  - 1 x daily - 7 x weekly - 3 sets - 10 reps  VESTIBULAR ASSESSMENT   GENERAL OBSERVATION: wears progressive lens    SYMPTOM BEHAVIOR:   Subjective history: poor balance   Non-Vestibular symptoms: changes in hearing   Type of dizziness: Unsteady with head/body turns   Frequency: varies   Duration: varies   Aggravating factors:    Relieving factors: slow movements   Progression of symptoms: insidious   OCULOMOTOR  EXAM:   Ocular Alignment: normal    Ocular ROM: No Limitations    Spontaneous Nystagmus: absent    Gaze-Induced Nystagmus: absent    Smooth Pursuits: saccades, more towards right tracking    Saccades: overshoots left> right     Convergence/Divergence: 8 cm    Head Impulse Test: difficult to assess due to neck tension, possibly +HIT to the left>right      VESTIBULAR - OCULAR REFLEX:    Slow VOR: Normal   VOR Cancellation: Normal      Dynamic Visual Acuity: Not able  to be assessed      POSITIONAL TESTING: NT due to no c/o positional vertigo         DIAGNOSTIC FINDINGS:   COGNITION: Overall cognitive status: Within functional limits for tasks assessed   SENSATION: Not tested  COORDINATION:   EDEMA:    MUSCLE TONE: resting and action tremor noted, UE > LE   MUSCLE LENGTH: NT  DTRs:    POSTURE: forward head  LOWER EXTREMITY ROM:     Active  Right Eval Left Eval  Hip flexion    Hip extension    Hip abduction    Hip adduction    Hip internal rotation    Hip external rotation    Knee flexion    Knee extension    Ankle dorsiflexion 10 10  Ankle plantarflexion    Ankle inversion    Ankle eversion     (Blank rows = not tested)  LOWER EXTREMITY MMT:    MMT Right Eval Left Eval  Hip flexion 5 4  Hip extension    Hip abduction 5 4  Hip adduction    Hip internal rotation    Hip external rotation    Knee flexion 5 5  Knee extension 5 4  Ankle dorsiflexion 5 5  Ankle plantarflexion    Ankle inversion    Ankle eversion    (Blank rows = not tested)  BED MOBILITY:  NT  TRANSFERS: Assistive device utilized: None  Sit to stand: Complete Independence Stand to sit: Complete Independence Chair to chair: Complete Independence Floor:  NT  RAMP:  NT  CURB:  NT  STAIRS: Level of Assistance: Modified independence Stair Negotiation Technique: Alternating Pattern  with Single Rail on Right Single Rail on Left Number of Stairs: 15  Height of Stairs: 4-6  Comments:   GAIT: Gait pattern:  unsteady  tremoring presnet Distance walked:  Assistive device utilized: None Level of assistance: Complete Independence and Modified independence Comments: reduced velocity  FUNCTIONAL TESTS:  Timed up and go (TUG): NT 10 meter walk test: 2.66 ft/sec Berg Balance Scale: NT Functional gait assessment: 16/30  M-CTSIB  Condition 1: Firm Surface, EO 30 Sec, Normal and Mild Sway  Condition 2: Firm Surface, EC 10 Sec,  Severe Sway  Condition 3: Foam Surface, EO 20 Sec, Moderate and Severe Sway  Condition 4: Foam Surface, EC 8 Sec, Severe Sway    PATIENT SURVEYS:        GOALS: Goals reviewed with patient? Yes  SHORT TERM GOALS: Target date: 08/09/2022     Patient will be independent in HEP to improve functional outcomes Baseline: Goal status: MET  2.  Demo improved balance and safety with activities such as standing/showering maintaining normal-mild sway per 30 sec condition 2 M-CTSIB Baseline: 10 sec, severe sway/retro LOB Goal status: MET  3.  Demo improved balance and safety with activities such as standing/showering maintaining normal-mild sway per 30 sec condition  4 M-CTSIB  Baseline: UNABLE  Goal status: IN PROGRESS  LONG TERM GOALS: Target date: 09/06/2022        Demo independence with advanced HEP that could include, but not limited to: strength, balance, coordination, posture, and ambulation practices Baseline:  Goal status: IN PROGRESS  2.  Demo 5/5 LLE strength to improve single limb support for improved gait stability Baseline: 4/5 LLE; (05/17/22) 4+/5 Goal status: IN PROGRESS  3.  Demo improved balance and reduced risk for falls per score 25/30 Functional Gait Assessment Baseline: 16/30; (05/17/22) 20/30; 18/30 (07/12/22) Goal status: IN PROGRESS   4. Demo improved balance and postural control per score 22/28 Mini-BESTest  Baseline: 15/28  Goal status: INITIAL    ASSESSMENT:  CLINICAL IMPRESSION: Continued with multi-sensory balance and dynamic balance demands to reduce risk for falls.  Best performance on condition 4 M-CTSIB of 14 sec today. Good response to push-release with functional righting reaction response. Continued sessions to advance POC details  OBJECTIVE IMPAIRMENTS: Abnormal gait, decreased activity tolerance, decreased balance, decreased coordination, decreased knowledge of use of DME, difficulty walking, decreased strength, impaired perceived functional  ability, and postural dysfunction.   ACTIVITY LIMITATIONS: carrying, lifting, squatting, stairs, transfers, and locomotion level  PARTICIPATION LIMITATIONS: interpersonal relationship, community activity, and yard work  PERSONAL FACTORS: Age, Time since onset of injury/illness/exacerbation, and 1-2 comorbidities: essential tremor, Right TKR  are also affecting patient's functional outcome.   REHAB POTENTIAL: Good  CLINICAL DECISION MAKING: Evolving/moderate complexity  EVALUATION COMPLEXITY: Moderate  PLAN:  PT FREQUENCY: 1x/week  PT DURATION: 6 weeks  PLANNED INTERVENTIONS: Therapeutic exercises, Therapeutic activity, Neuromuscular re-education, Balance training, Gait training, Patient/Family education, Self Care, Joint mobilization, Stair training, Vestibular training, Canalith repositioning, Orthotic/Fit training, DME instructions, Aquatic Therapy, Dry Needling, Electrical stimulation, Spinal mobilization, Cryotherapy, Moist heat, Taping, Ionotophoresis 4mg /ml Dexamethasone, and Manual therapy  PLAN FOR NEXT SESSION: HEP review, progress balance activities   8:51 AM, 07/20/22 M. Shary Decamp, PT, DPT Physical Therapist- [place] Office Number: [phone]

## 2022-07-26 ENCOUNTER — Ambulatory Visit: Payer: Medicare Other

## 2022-07-26 DIAGNOSIS — M6281 Muscle weakness (generalized): Secondary | ICD-10-CM

## 2022-07-26 DIAGNOSIS — Z9181 History of falling: Secondary | ICD-10-CM | POA: Diagnosis not present

## 2022-07-26 DIAGNOSIS — R293 Abnormal posture: Secondary | ICD-10-CM

## 2022-07-26 DIAGNOSIS — R2689 Other abnormalities of gait and mobility: Secondary | ICD-10-CM | POA: Diagnosis not present

## 2022-07-26 DIAGNOSIS — R2681 Unsteadiness on feet: Secondary | ICD-10-CM

## 2022-07-26 NOTE — Therapy (Signed)
OUTPATIENT PHYSICAL THERAPY NEURO TREATMENT   Patient Name: Cole Martinez MRN: [id number] DOB:15-Apr-1945, 77 y.o., male Today's Date: 07/26/2022   PCP: Marden Noble, MD REFERRING PROVIDER: Glean Salvo, NP    END OF SESSION:  PT End of Session - 07/26/22 0938     Visit Number 12    Number of Visits 18    Date for PT Re-Evaluation 09/06/22    Authorization Type Medicare A&B/ [place]    Progress Note Due on Visit 20    PT Start Time 0930    PT Stop Time 1015    PT Time Calculation (min) 45 min             Past Medical History:  Diagnosis Date   ADHD (attention deficit hyperactivity disorder)    B12 deficiency    BPH (benign prostatic hypertrophy)    Chronic lymphocytic leukemia (CLL), T-cell (HCC) DX 1996--  ONCOLOGIST-  DR Truett Perna   PT IS ASYMPTOMATIC--- LAST CBC W/ DIFF 06-24-2012 STABLE   Coronary artery disease CARDIOLOGIST- DR Verdis Prime   S/P STENTING LAD 1999 // Myoview 01/2019: EF 62, normal perfusion; Low Risk   Crohn's disease of ileum (HCC) SINCE 1988   ED (erectile dysfunction)    Elevated PSA    Essential and other specified forms of tremor 11/25/2012   Gait abnormality 05/17/2020   H/O adenomatous polyp of colon    Hyperlipidemia    Hypertension    Nocturia    OA (osteoarthritis)    Peripheral neuropathy    hx of, none current as of 08-04-13   Peyronie disease    S/P coronary artery stent placement OCT 1999 OF LAD   Tremor, hereditary, benign MILD RIGHT HAND   Past Surgical History:  Procedure Laterality Date   CARDIOVASCULAR STRESS TEST  11-01-2010 DR Verdis Prime   NORMAL PERFUSION STUDY/ EF 64%/ NO ISCHEMIA   cataract surgery  Bilateral feburary 2020   with lens placement    COLONOSCOPY WITH PROPOFOL N/A 09/24/2012   Procedure: COLONOSCOPY WITH PROPOFOL;  Surgeon: Charolett Bumpers, MD;  Location: [facility];  Service: Endoscopy;  Laterality: N/A;   CORONARY ANGIOPLASTY WITH STENT PLACEMENT  OCT 1999   STENT OF LAD   CORONARY STENT  INTERVENTION N/A 02/14/2019   Procedure: CORONARY STENT INTERVENTION;  Surgeon: Lyn Records, MD;  Location: [facility];  Service: Cardiovascular;  Laterality: N/A;   CYSTOSCOPY WITH URETHRAL DILATATION  03/12/2017   Procedure: CYSTOSCOPY WITH URETHRAL DILATATION;  Surgeon: Ollen Gross, MD;  Location: [facility];  Service: Orthopedics;;  [name], Resident Assisting   CYSTOSCOPY WITH URETHRAL DILATATION N/A 10/03/2018   Procedure: CYSTOSCOPY WITH URETHRAL BALLOON DILATATION WITH BILATERAL RETROGRADE PYELOGRAPHY;  Surgeon: Heloise Purpura, MD;  Location: [facility];  Service: Urology;  Laterality: N/A;   CYSTOSCOPY WITH URETHRAL DILATATION N/A 12/30/2020   Procedure: CYSTOSCOPY WITH BALLOON DILATATION OF URETHRAL STRICTURE;  Surgeon: Heloise Purpura, MD;  Location: [facility];  Service: Urology;  Laterality: N/A;   LAPAROSCOPIC INGUINAL HERNIA REPAIR Bilateral 01-10-2004   W/ MESH   LEFT HEART CATH AND CORONARY ANGIOGRAPHY N/A 02/13/2019   Procedure: LEFT HEART CATH AND CORONARY ANGIOGRAPHY;  Surgeon: Lyn Records, MD;  Location: [facility];  Service: Cardiovascular;  Laterality: N/A;   neck benign removed from neck  yrs ago   PROSTATE BIOPSY N/A 07/12/2012   Procedure: PROSTATE BIOPSY AND ULTRASOUND;  Surgeon: Kathi Ludwig, MD;  Location: [facility];  Service: Urology;  Laterality: N/A;   PROSTATE SURGERY  2002   tuna   REMOVAL LEFT NECK LYMPH NODE  1996   TONSILLECTOMY  CHILD   TOTAL KNEE ARTHROPLASTY Right 03/12/2017   Procedure: RIGHT TOTAL KNEE ARTHROPLASTY;  Surgeon: Ollen Gross, MD;  Location: [facility];  Service: Orthopedics;  Laterality: Right;  Adductor Block   TRANSURETHRAL RESECTION OF PROSTATE N/A 08/08/2013   Procedure: TRANSURETHRAL RESECTION OF THE PROSTATE WITH GYRUS INSTRUMENTS;  Surgeon: Kathi Ludwig, MD;  Location: [facility];  Service: Urology;  Laterality: N/A;   TRANSURETHRAL RESECTION OF PROSTATE     Patient Active Problem List   Diagnosis  Date Noted   Adult attention deficit disorder 06/04/2020   Chronic lymphocytic leukemia (HCC) 06/04/2020   Conductive hearing loss, bilateral 06/04/2020   Crohn's ileitis (HCC) 06/04/2020   Disorder of musculoskeletal system 06/04/2020   Erectile dysfunction 06/04/2020   Gastroesophageal reflux disease 06/04/2020   Gout 06/04/2020   History of adenomatous polyp of colon 06/04/2020   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   Hypothyroidism 06/04/2020   Increased frequency of urination 06/04/2020   Male hypogonadism 06/04/2020   Muscle pain 06/04/2020   Nocturia 06/04/2020   Obesity 06/04/2020   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   Osteopenia 06/04/2020   Peripheral neurogenic pain 06/04/2020   Pure hypercholesterolemia 06/04/2020   Recurrent falls 06/04/2020   Unspecified kyphosis, thoracic region 06/04/2020   Vitamin B12 deficiency 06/04/2020   Vitamin D deficiency 06/04/2020   Gait abnormality 05/17/2020   Ataxia 03/16/2020   Angina pectoris (HCC) 02/13/2019   Pseudophakia of both eyes 08/01/2018   History of total knee arthroplasty 03/29/2017   Stiffness of right knee 03/16/2017   OA (osteoarthritis) of knee 03/12/2017   Tremor, essential 12/03/2015   Essential hypertension 12/09/2014   Coronary artery disease involving native heart 12/08/2013   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   Hyperlipidemia 12/08/2013   Benign prostatic hypertrophy 08/08/2013   Essential and other specified forms of tremor 11/25/2012    ONSET DATE: tremor has been present for years  REFERRING DIAG: R26.89 (ICD-10-CM) - Balance problem G25.0 (ICD-10-CM) - Essential tremor  THERAPY DIAG:  Unsteadiness on feet  Other abnormalities of gait and mobility  Abnormal posture  Muscle weakness (generalized)  History of falling  Rationale for Evaluation and Treatment: Rehabilitation  SUBJECTIVE:                                                                                                                                                                                              SUBJECTIVE STATEMENT: Did water walking this AM  Pt accompanied by: self  PERTINENT HISTORY: underlying complex medical history of coronary artery disease with status post stenting, hypertension, hyperlipidemia, elevated PSA, urethral stricture, Crohn's disease, CLL, ADHD, vitamin B12 deficiency, , R TKR, osteoarthritis, neuropathy, and tremor,  PAIN:  Are you having pain? No  PRECAUTIONS: Fall  WEIGHT BEARING RESTRICTIONS: No  FALLS: Has patient fallen in last 6 months? No, reports fall 9 months ago, but fearful of falling  LIVING ENVIRONMENT: Lives with: lives with their spouse Lives in: House/apartment Stairs: Yes: Internal: flight steps; on right going up and External: yes steps; can reach both Has following equipment at home: None  PLOF: Independent  PATIENT GOALS: improve balance, upper body strength. Not willing to use AD  OBJECTIVE:   TODAY'S TREATMENT: 07/26/22 Activity Comments  NU-step x 5 min For warm-up, speed intervals  Sit-stand, stride stance, suitcase hold 2x10, 20# for LE bias and postural stability in mobility  Sit-stand w/ alt march 1x10 20# for SLS and to reduce retropulsion in sit to stand  Suitcase carry x 2 min 20#  Hip bumps 1x10 -EO/EC for proprioception  Rocker board 2x60 sec -ant-post and lateral  Standing on slope 2x30 sec EO/EC       PATIENT EDUCATION: Education details: assessment findings, rationale for PT intervention, HEP initiation Person educated: Patient Education method: Explanation, Demonstration, and Handouts Education comprehension: verbalized understanding, returned demonstration, and needs further education  HOME EXERCISE PROGRAM: Access Code: [email or web address] URL: [email or web address] Date: 04/10/2022 Prepared by: Shary Decamp  Exercises - Standing Balance in Corner  - 1 x daily - 7 x weekly - 3 sets -  30 sec hold - Standing Balance in Corner with Eyes Closed  - 1 x daily - 7 x weekly - 3 sets - 30 sec hold - Corner Balance Feet Together: Eyes Open With Head Turns  - 1 x daily - 7 x weekly - 3 sets - 3-5 reps - Corner Balance Feet Together: Eyes Closed With Head Turns  - 1 x daily - 7 x weekly - 3 sets - 3-5 reps - Seated Cervical Retraction  - 1 x daily - 7 x weekly - 3 sets - 10 reps - 3-5 sec hold - Standing Cervical Retraction  - 1 x daily - 7 x weekly - 3 sets - 10 reps - Supine Cervical Retraction with Towel  - 1 x daily - 7 x weekly - 1-3 sets - 10 reps - 3-5 sec hold - Lat Pull Down  - 1 x daily - 7 x weekly - 3 sets - 10 reps - Seated Row Cable Machine  - 1 x daily - 7 x weekly - 3 sets - 10 reps - Deadlift with Pelvic Contraction  - 1 x daily - 7 x weekly - 3 sets - 10 reps - Full Leg Press  - 1 x daily - 7 x weekly - 3 sets - 10 reps - Seated Chest Press with Dumbbells  - 1 x daily - 7 x weekly - 3 sets - 10 reps  VESTIBULAR ASSESSMENT   GENERAL OBSERVATION: wears progressive lens    SYMPTOM BEHAVIOR:   Subjective history: poor balance   Non-Vestibular symptoms: changes in hearing   Type of dizziness: Unsteady with head/body turns   Frequency: varies   Duration: varies   Aggravating factors:    Relieving factors: slow movements   Progression of symptoms: insidious   OCULOMOTOR EXAM:   Ocular Alignment: normal    Ocular ROM: No Limitations    Spontaneous Nystagmus: absent  Gaze-Induced Nystagmus: absent    Smooth Pursuits: saccades, more towards right tracking    Saccades: overshoots left> right     Convergence/Divergence: 8 cm    Head Impulse Test: difficult to assess due to neck tension, possibly +HIT to the left>right      VESTIBULAR - OCULAR REFLEX:    Slow VOR: Normal   VOR Cancellation: Normal      Dynamic Visual Acuity: Not able to be assessed      POSITIONAL TESTING: NT due to no c/o positional vertigo         DIAGNOSTIC FINDINGS:    COGNITION: Overall cognitive status: Within functional limits for tasks assessed   SENSATION: Not tested  COORDINATION:   EDEMA:    MUSCLE TONE: resting and action tremor noted, UE > LE   MUSCLE LENGTH: NT  DTRs:    POSTURE: forward head  LOWER EXTREMITY ROM:     Active  Right Eval Left Eval  Hip flexion    Hip extension    Hip abduction    Hip adduction    Hip internal rotation    Hip external rotation    Knee flexion    Knee extension    Ankle dorsiflexion 10 10  Ankle plantarflexion    Ankle inversion    Ankle eversion     (Blank rows = not tested)  LOWER EXTREMITY MMT:    MMT Right Eval Left Eval  Hip flexion 5 4  Hip extension    Hip abduction 5 4  Hip adduction    Hip internal rotation    Hip external rotation    Knee flexion 5 5  Knee extension 5 4  Ankle dorsiflexion 5 5  Ankle plantarflexion    Ankle inversion    Ankle eversion    (Blank rows = not tested)  BED MOBILITY:  NT  TRANSFERS: Assistive device utilized: None  Sit to stand: Complete Independence Stand to sit: Complete Independence Chair to chair: Complete Independence Floor:  NT  RAMP:  NT  CURB:  NT  STAIRS: Level of Assistance: Modified independence Stair Negotiation Technique: Alternating Pattern  with Single Rail on Right Single Rail on Left Number of Stairs: 15  Height of Stairs: 4-6  Comments:   GAIT: Gait pattern:  unsteady  tremoring presnet Distance walked:  Assistive device utilized: None Level of assistance: Complete Independence and Modified independence Comments: reduced velocity  FUNCTIONAL TESTS:  Timed up and go (TUG): NT 10 meter walk test: 2.66 ft/sec Berg Balance Scale: NT Functional gait assessment: 16/30  M-CTSIB  Condition 1: Firm Surface, EO 30 Sec, Normal and Mild Sway  Condition 2: Firm Surface, EC 10 Sec, Severe Sway  Condition 3: Foam Surface, EO 20 Sec, Moderate and Severe Sway  Condition 4: Foam Surface, EC 8 Sec,  Severe Sway    PATIENT SURVEYS:        GOALS: Goals reviewed with patient? Yes  SHORT TERM GOALS: Target date: 08/09/2022     Patient will be independent in HEP to improve functional outcomes Baseline: Goal status: MET  2.  Demo improved balance and safety with activities such as standing/showering maintaining normal-mild sway per 30 sec condition 2 M-CTSIB Baseline: 10 sec, severe sway/retro LOB Goal status: MET  3.  Demo improved balance and safety with activities such as standing/showering maintaining normal-mild sway per 30 sec condition 4 M-CTSIB  Baseline: UNABLE  Goal status: IN PROGRESS  LONG TERM GOALS: Target date: 09/06/2022  Demo independence with advanced HEP that could include, but not limited to: strength, balance, coordination, posture, and ambulation practices Baseline:  Goal status: IN PROGRESS  2.  Demo 5/5 LLE strength to improve single limb support for improved gait stability Baseline: 4/5 LLE; (05/17/22) 4+/5 Goal status: IN PROGRESS  3.  Demo improved balance and reduced risk for falls per score 25/30 Functional Gait Assessment Baseline: 16/30; (05/17/22) 20/30; 18/30 (07/12/22) Goal status: IN PROGRESS   4. Demo improved balance and postural control per score 22/28 Mini-BESTest  Baseline: 15/28  Goal status: INITIAL    ASSESSMENT:  CLINICAL IMPRESSION: Continued with activities to promote improved balance and enhance righting reactions against postural perturbations to improve safety with ambulation and negotiation of obstacles and various surfaces.  Introduced new technique to improve control/proprioceptive awareness with hip bump activity under eyes open and closed conditions to improve ability to regain postural control beyond limits of stability.  Continues to exhibit tendency for retro-LOB espeically under eyes closed conditions and exhibits less than functional stepping response to LOB requiring cga-min A to correct. Patient would  benefit from further sessions to improve balance, stability, motor control, and reduce risk for falls.   OBJECTIVE IMPAIRMENTS: Abnormal gait, decreased activity tolerance, decreased balance, decreased coordination, decreased knowledge of use of DME, difficulty walking, decreased strength, impaired perceived functional ability, and postural dysfunction.   ACTIVITY LIMITATIONS: carrying, lifting, squatting, stairs, transfers, and locomotion level  PARTICIPATION LIMITATIONS: interpersonal relationship, community activity, and yard work  PERSONAL FACTORS: Age, Time since onset of injury/illness/exacerbation, and 1-2 comorbidities: essential tremor, Right TKR  are also affecting patient's functional outcome.   REHAB POTENTIAL: Good  CLINICAL DECISION MAKING: Evolving/moderate complexity  EVALUATION COMPLEXITY: Moderate  PLAN:  PT FREQUENCY: 1x/week  PT DURATION: 6 weeks  PLANNED INTERVENTIONS: Therapeutic exercises, Therapeutic activity, Neuromuscular re-education, Balance training, Gait training, Patient/Family education, Self Care, Joint mobilization, Stair training, Vestibular training, Canalith repositioning, Orthotic/Fit training, DME instructions, Aquatic Therapy, Dry Needling, Electrical stimulation, Spinal mobilization, Cryotherapy, Moist heat, Taping, Ionotophoresis 4mg /ml Dexamethasone, and Manual therapy  PLAN FOR NEXT SESSION: HEP review, progress balance activities   9:38 AM, 07/26/22 M. Shary Decamp, PT, DPT Physical Therapist- [place] Office Number: [phone]

## 2022-07-29 DIAGNOSIS — S7002XA Contusion of left hip, initial encounter: Secondary | ICD-10-CM | POA: Diagnosis not present

## 2022-08-02 ENCOUNTER — Ambulatory Visit: Payer: Medicare Other | Admitting: Physical Therapy

## 2022-08-02 ENCOUNTER — Ambulatory Visit: Payer: Medicare Other

## 2022-08-02 DIAGNOSIS — R293 Abnormal posture: Secondary | ICD-10-CM | POA: Diagnosis not present

## 2022-08-02 DIAGNOSIS — M6281 Muscle weakness (generalized): Secondary | ICD-10-CM

## 2022-08-02 DIAGNOSIS — R2689 Other abnormalities of gait and mobility: Secondary | ICD-10-CM | POA: Diagnosis not present

## 2022-08-02 DIAGNOSIS — R2681 Unsteadiness on feet: Secondary | ICD-10-CM

## 2022-08-02 DIAGNOSIS — Z9181 History of falling: Secondary | ICD-10-CM

## 2022-08-02 NOTE — Therapy (Signed)
OUTPATIENT PHYSICAL THERAPY NEURO TREATMENT   Patient Name: XAEDEN LAMARTINA MRN: [id number] DOB:1945-12-29, 77 y.o., male Today's Date: 08/02/2022   PCP: Marden Noble, MD REFERRING PROVIDER: Glean Salvo, NP    END OF SESSION:  PT End of Session - 08/02/22 0930     Visit Number 13    Number of Visits 18    Date for PT Re-Evaluation 09/06/22    Authorization Type Medicare A&B/ [place]    Progress Note Due on Visit 20    PT Start Time 0930    PT Stop Time 1015    PT Time Calculation (min) 45 min             Past Medical History:  Diagnosis Date   ADHD (attention deficit hyperactivity disorder)    B12 deficiency    BPH (benign prostatic hypertrophy)    Chronic lymphocytic leukemia (CLL), T-cell (HCC) DX 1996--  ONCOLOGIST-  DR Truett Perna   PT IS ASYMPTOMATIC--- LAST CBC W/ DIFF 06-24-2012 STABLE   Coronary artery disease CARDIOLOGIST- DR Verdis Prime   S/P STENTING LAD 1999 // Myoview 01/2019: EF 62, normal perfusion; Low Risk   Crohn's disease of ileum (HCC) SINCE 1988   ED (erectile dysfunction)    Elevated PSA    Essential and other specified forms of tremor 11/25/2012   Gait abnormality 05/17/2020   H/O adenomatous polyp of colon    Hyperlipidemia    Hypertension    Nocturia    OA (osteoarthritis)    Peripheral neuropathy    hx of, none current as of 08-04-13   Peyronie disease    S/P coronary artery stent placement OCT 1999 OF LAD   Tremor, hereditary, benign MILD RIGHT HAND   Past Surgical History:  Procedure Laterality Date   CARDIOVASCULAR STRESS TEST  11-01-2010 DR Verdis Prime   NORMAL PERFUSION STUDY/ EF 64%/ NO ISCHEMIA   cataract surgery  Bilateral feburary 2020   with lens placement    COLONOSCOPY WITH PROPOFOL N/A 09/24/2012   Procedure: COLONOSCOPY WITH PROPOFOL;  Surgeon: Charolett Bumpers, MD;  Location: [facility];  Service: Endoscopy;  Laterality: N/A;   CORONARY ANGIOPLASTY WITH STENT PLACEMENT  OCT 1999   STENT OF LAD   CORONARY STENT  INTERVENTION N/A 02/14/2019   Procedure: CORONARY STENT INTERVENTION;  Surgeon: Lyn Records, MD;  Location: [facility];  Service: Cardiovascular;  Laterality: N/A;   CYSTOSCOPY WITH URETHRAL DILATATION  03/12/2017   Procedure: CYSTOSCOPY WITH URETHRAL DILATATION;  Surgeon: Ollen Gross, MD;  Location: [facility];  Service: Orthopedics;;  [name], Resident Assisting   CYSTOSCOPY WITH URETHRAL DILATATION N/A 10/03/2018   Procedure: CYSTOSCOPY WITH URETHRAL BALLOON DILATATION WITH BILATERAL RETROGRADE PYELOGRAPHY;  Surgeon: Heloise Purpura, MD;  Location: [facility];  Service: Urology;  Laterality: N/A;   CYSTOSCOPY WITH URETHRAL DILATATION N/A 12/30/2020   Procedure: CYSTOSCOPY WITH BALLOON DILATATION OF URETHRAL STRICTURE;  Surgeon: Heloise Purpura, MD;  Location: [facility];  Service: Urology;  Laterality: N/A;   LAPAROSCOPIC INGUINAL HERNIA REPAIR Bilateral 01-10-2004   W/ MESH   LEFT HEART CATH AND CORONARY ANGIOGRAPHY N/A 02/13/2019   Procedure: LEFT HEART CATH AND CORONARY ANGIOGRAPHY;  Surgeon: Lyn Records, MD;  Location: [facility];  Service: Cardiovascular;  Laterality: N/A;   neck benign removed from neck  yrs ago   PROSTATE BIOPSY N/A 07/12/2012   Procedure: PROSTATE BIOPSY AND ULTRASOUND;  Surgeon: Kathi Ludwig, MD;  Location: [facility];  Service: Urology;  Laterality: N/A;   PROSTATE SURGERY  2002   tuna   REMOVAL LEFT NECK LYMPH NODE  1996   TONSILLECTOMY  CHILD   TOTAL KNEE ARTHROPLASTY Right 03/12/2017   Procedure: RIGHT TOTAL KNEE ARTHROPLASTY;  Surgeon: Ollen Gross, MD;  Location: [facility];  Service: Orthopedics;  Laterality: Right;  Adductor Block   TRANSURETHRAL RESECTION OF PROSTATE N/A 08/08/2013   Procedure: TRANSURETHRAL RESECTION OF THE PROSTATE WITH GYRUS INSTRUMENTS;  Surgeon: Kathi Ludwig, MD;  Location: [facility];  Service: Urology;  Laterality: N/A;   TRANSURETHRAL RESECTION OF PROSTATE     Patient Active Problem List   Diagnosis  Date Noted   Adult attention deficit disorder 06/04/2020   Chronic lymphocytic leukemia (HCC) 06/04/2020   Conductive hearing loss, bilateral 06/04/2020   Crohn's ileitis (HCC) 06/04/2020   Disorder of musculoskeletal system 06/04/2020   Erectile dysfunction 06/04/2020   Gastroesophageal reflux disease 06/04/2020   Gout 06/04/2020   History of adenomatous polyp of colon 06/04/2020   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   Hypothyroidism 06/04/2020   Increased frequency of urination 06/04/2020   Male hypogonadism 06/04/2020   Muscle pain 06/04/2020   Nocturia 06/04/2020   Obesity 06/04/2020   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   Osteopenia 06/04/2020   Peripheral neurogenic pain 06/04/2020   Pure hypercholesterolemia 06/04/2020   Recurrent falls 06/04/2020   Unspecified kyphosis, thoracic region 06/04/2020   Vitamin B12 deficiency 06/04/2020   Vitamin D deficiency 06/04/2020   Gait abnormality 05/17/2020   Ataxia 03/16/2020   Angina pectoris (HCC) 02/13/2019   Pseudophakia of both eyes 08/01/2018   History of total knee arthroplasty 03/29/2017   Stiffness of right knee 03/16/2017   OA (osteoarthritis) of knee 03/12/2017   Tremor, essential 12/03/2015   Essential hypertension 12/09/2014   Coronary artery disease involving native heart 12/08/2013   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   Hyperlipidemia 12/08/2013   Benign prostatic hypertrophy 08/08/2013   Essential and other specified forms of tremor 11/25/2012    ONSET DATE: tremor has been present for years  REFERRING DIAG: R26.89 (ICD-10-CM) - Balance problem G25.0 (ICD-10-CM) - Essential tremor  THERAPY DIAG:  Unsteadiness on feet  Other abnormalities of gait and mobility  Abnormal posture  Muscle weakness (generalized)  History of falling  Rationale for Evaluation and Treatment: Rehabilitation  SUBJECTIVE:                                                                                                                                                                                              SUBJECTIVE STATEMENT: Popped the left hip on Saturday, had it x-rayed. Did  water walking this AM  Pt accompanied by: self  PERTINENT HISTORY: underlying complex medical history of coronary artery disease with status post stenting, hypertension, hyperlipidemia, elevated PSA, urethral stricture, Crohn's disease, CLL, ADHD, vitamin B12 deficiency, , R TKR, osteoarthritis, neuropathy, and tremor,  PAIN:  Are you having pain? Yes, left lateral hip 1-2/10  PRECAUTIONS: Fall  WEIGHT BEARING RESTRICTIONS: No  FALLS: Has patient fallen in last 6 months? No, reports fall 9 months ago, but fearful of falling  LIVING ENVIRONMENT: Lives with: lives with their spouse Lives in: House/apartment Stairs: Yes: Internal: flight steps; on right going up and External: yes steps; can reach both Has following equipment at home: None  PLOF: Independent  PATIENT GOALS: improve balance, upper body strength. Not willing to use AD  OBJECTIVE:   TODAY'S TREATMENT: 08/02/22 Activity Comments  Static balance  -feet together EO/EC x 30 sec  -feet together head turns EO/EC 3x -tandem EO/EC x 15 sec-finger tip support  Sidestep x 2 min 5#  Hamstring curls 2x10 5#  Standing PWR moves 2x10   Standing foot on step EO/EC for SLS       TODAY'S TREATMENT: 07/26/22 Activity Comments  NU-step x 5 min For warm-up, speed intervals  Sit-stand, stride stance, suitcase hold 2x10, 20# for LE bias and postural stability in mobility  Sit-stand w/ alt march 1x10 20# for SLS and to reduce retropulsion in sit to stand  Suitcase carry x 2 min 20#  Hip bumps 1x10 -EO/EC for proprioception  Rocker board 2x60 sec -ant-post and lateral  Standing on slope 2x30 sec EO/EC       PATIENT EDUCATION: Education details: assessment findings, rationale for PT intervention, HEP initiation Person educated: Patient Education  method: Explanation, Demonstration, and Handouts Education comprehension: verbalized understanding, returned demonstration, and needs further education  HOME EXERCISE PROGRAM: Access Code: [email or web address] URL: [email or web address] Date: 04/10/2022 Prepared by: Shary Decamp  Exercises - Standing Balance in Corner  - 1 x daily - 7 x weekly - 3 sets - 30 sec hold - Standing Balance in Corner with Eyes Closed  - 1 x daily - 7 x weekly - 3 sets - 30 sec hold - Corner Balance Feet Together: Eyes Open With Head Turns  - 1 x daily - 7 x weekly - 3 sets - 3-5 reps - Corner Balance Feet Together: Eyes Closed With Head Turns  - 1 x daily - 7 x weekly - 3 sets - 3-5 reps - Seated Cervical Retraction  - 1 x daily - 7 x weekly - 3 sets - 10 reps - 3-5 sec hold - Standing Cervical Retraction  - 1 x daily - 7 x weekly - 3 sets - 10 reps - Supine Cervical Retraction with Towel  - 1 x daily - 7 x weekly - 1-3 sets - 10 reps - 3-5 sec hold - Lat Pull Down  - 1 x daily - 7 x weekly - 3 sets - 10 reps - Seated Row Cable Machine  - 1 x daily - 7 x weekly - 3 sets - 10 reps - Deadlift with Pelvic Contraction  - 1 x daily - 7 x weekly - 3 sets - 10 reps - Full Leg Press  - 1 x daily - 7 x weekly - 3 sets - 10 reps - Seated Chest Press with Dumbbells  - 1 x daily - 7 x weekly - 3 sets - 10 reps  VESTIBULAR ASSESSMENT   GENERAL OBSERVATION: wears progressive lens  SYMPTOM BEHAVIOR:   Subjective history: poor balance   Non-Vestibular symptoms: changes in hearing   Type of dizziness: Unsteady with head/body turns   Frequency: varies   Duration: varies   Aggravating factors:    Relieving factors: slow movements   Progression of symptoms: insidious   OCULOMOTOR EXAM:   Ocular Alignment: normal    Ocular ROM: No Limitations    Spontaneous Nystagmus: absent    Gaze-Induced Nystagmus: absent    Smooth Pursuits: saccades, more towards right tracking    Saccades: overshoots left>  right     Convergence/Divergence: 8 cm    Head Impulse Test: difficult to assess due to neck tension, possibly +HIT to the left>right      VESTIBULAR - OCULAR REFLEX:    Slow VOR: Normal   VOR Cancellation: Normal      Dynamic Visual Acuity: Not able to be assessed      POSITIONAL TESTING: NT due to no c/o positional vertigo         DIAGNOSTIC FINDINGS:   COGNITION: Overall cognitive status: Within functional limits for tasks assessed   SENSATION: Not tested  COORDINATION:   EDEMA:    MUSCLE TONE: resting and action tremor noted, UE > LE   MUSCLE LENGTH: NT  DTRs:    POSTURE: forward head  LOWER EXTREMITY ROM:     Active  Right Eval Left Eval  Hip flexion    Hip extension    Hip abduction    Hip adduction    Hip internal rotation    Hip external rotation    Knee flexion    Knee extension    Ankle dorsiflexion 10 10  Ankle plantarflexion    Ankle inversion    Ankle eversion     (Blank rows = not tested)  LOWER EXTREMITY MMT:    MMT Right Eval Left Eval  Hip flexion 5 4  Hip extension    Hip abduction 5 4  Hip adduction    Hip internal rotation    Hip external rotation    Knee flexion 5 5  Knee extension 5 4  Ankle dorsiflexion 5 5  Ankle plantarflexion    Ankle inversion    Ankle eversion    (Blank rows = not tested)  BED MOBILITY:  NT  TRANSFERS: Assistive device utilized: None  Sit to stand: Complete Independence Stand to sit: Complete Independence Chair to chair: Complete Independence Floor:  NT  RAMP:  NT  CURB:  NT  STAIRS: Level of Assistance: Modified independence Stair Negotiation Technique: Alternating Pattern  with Single Rail on Right Single Rail on Left Number of Stairs: 15  Height of Stairs: 4-6  Comments:   GAIT: Gait pattern:  unsteady  tremoring presnet Distance walked:  Assistive device utilized: None Level of assistance: Complete Independence and Modified independence Comments: reduced  velocity  FUNCTIONAL TESTS:  Timed up and go (TUG): NT 10 meter walk test: 2.66 ft/sec Berg Balance Scale: NT Functional gait assessment: 16/30  M-CTSIB  Condition 1: Firm Surface, EO 30 Sec, Normal and Mild Sway  Condition 2: Firm Surface, EC 10 Sec, Severe Sway  Condition 3: Foam Surface, EO 20 Sec, Moderate and Severe Sway  Condition 4: Foam Surface, EC 8 Sec, Severe Sway    PATIENT SURVEYS:        GOALS: Goals reviewed with patient? Yes  SHORT TERM GOALS: Target date: 08/09/2022     Patient will be independent in HEP to improve functional outcomes Baseline: Goal status:  MET  2.  Demo improved balance and safety with activities such as standing/showering maintaining normal-mild sway per 30 sec condition 2 M-CTSIB Baseline: 10 sec, severe sway/retro LOB Goal status: MET  3.  Demo improved balance and safety with activities such as standing/showering maintaining normal-mild sway per 30 sec condition 4 M-CTSIB  Baseline: UNABLE  Goal status: IN PROGRESS  LONG TERM GOALS: Target date: 09/06/2022        Demo independence with advanced HEP that could include, but not limited to: strength, balance, coordination, posture, and ambulation practices Baseline:  Goal status: IN PROGRESS  2.  Demo 5/5 LLE strength to improve single limb support for improved gait stability Baseline: 4/5 LLE; (05/17/22) 4+/5 Goal status: IN PROGRESS  3.  Demo improved balance and reduced risk for falls per score 25/30 Functional Gait Assessment Baseline: 16/30; (05/17/22) 20/30; 18/30 (07/12/22) Goal status: IN PROGRESS   4. Demo improved balance and postural control per score 22/28 Mini-BESTest  Baseline: 15/28  Goal status: INITIAL    ASSESSMENT:  CLINICAL IMPRESSION: Continued with activities to improve balance and coordination and facilitate righting reactions to reduce risk for falls.  Difficultu with large amplitude outside BOS requiring frequent UE support to correct LOB.   Continued sessions to advacne POC details and meet STG/LTG  OBJECTIVE IMPAIRMENTS: Abnormal gait, decreased activity tolerance, decreased balance, decreased coordination, decreased knowledge of use of DME, difficulty walking, decreased strength, impaired perceived functional ability, and postural dysfunction.   ACTIVITY LIMITATIONS: carrying, lifting, squatting, stairs, transfers, and locomotion level  PARTICIPATION LIMITATIONS: interpersonal relationship, community activity, and yard work  PERSONAL FACTORS: Age, Time since onset of injury/illness/exacerbation, and 1-2 comorbidities: essential tremor, Right TKR  are also affecting patient's functional outcome.   REHAB POTENTIAL: Good  CLINICAL DECISION MAKING: Evolving/moderate complexity  EVALUATION COMPLEXITY: Moderate  PLAN:  PT FREQUENCY: 1x/week  PT DURATION: 6 weeks  PLANNED INTERVENTIONS: Therapeutic exercises, Therapeutic activity, Neuromuscular re-education, Balance training, Gait training, Patient/Family education, Self Care, Joint mobilization, Stair training, Vestibular training, Canalith repositioning, Orthotic/Fit training, DME instructions, Aquatic Therapy, Dry Needling, Electrical stimulation, Spinal mobilization, Cryotherapy, Moist heat, Taping, Ionotophoresis 4mg /ml Dexamethasone, and Manual therapy  PLAN FOR NEXT SESSION: HEP review, progress balance activities   9:31 AM, 08/02/22 M. Shary Decamp, PT, DPT Physical Therapist- [place] Office Number: [phone]

## 2022-08-09 ENCOUNTER — Ambulatory Visit: Payer: Medicare Other | Admitting: Physical Therapy

## 2022-08-10 ENCOUNTER — Ambulatory Visit: Payer: Medicare Other | Attending: Neurology

## 2022-08-10 DIAGNOSIS — R293 Abnormal posture: Secondary | ICD-10-CM | POA: Diagnosis not present

## 2022-08-10 DIAGNOSIS — Z9181 History of falling: Secondary | ICD-10-CM | POA: Insufficient documentation

## 2022-08-10 DIAGNOSIS — R2689 Other abnormalities of gait and mobility: Secondary | ICD-10-CM | POA: Insufficient documentation

## 2022-08-10 DIAGNOSIS — M6281 Muscle weakness (generalized): Secondary | ICD-10-CM | POA: Insufficient documentation

## 2022-08-10 DIAGNOSIS — R2681 Unsteadiness on feet: Secondary | ICD-10-CM | POA: Diagnosis not present

## 2022-08-10 NOTE — Therapy (Signed)
OUTPATIENT PHYSICAL THERAPY NEURO TREATMENT   Patient Name: Cole Martinez MRN: [id number] DOB:10-08-1945, 77 y.o., male Today's Date: 08/10/2022   PCP: Marden Noble, MD REFERRING PROVIDER: Glean Salvo, NP    END OF SESSION:  PT End of Session - 08/10/22 0928     Visit Number 14    Number of Visits 18    Date for PT Re-Evaluation 09/06/22    Authorization Type Medicare A&B/ [place]    Progress Note Due on Visit 20    PT Start Time 0930    PT Stop Time 1015    PT Time Calculation (min) 45 min             Past Medical History:  Diagnosis Date   ADHD (attention deficit hyperactivity disorder)    B12 deficiency    BPH (benign prostatic hypertrophy)    Chronic lymphocytic leukemia (CLL), T-cell (HCC) DX 1996--  ONCOLOGIST-  DR Truett Perna   PT IS ASYMPTOMATIC--- LAST CBC W/ DIFF 06-24-2012 STABLE   Coronary artery disease CARDIOLOGIST- DR Verdis Prime   S/P STENTING LAD 1999 // Myoview 01/2019: EF 62, normal perfusion; Low Risk   Crohn's disease of ileum (HCC) SINCE 1988   ED (erectile dysfunction)    Elevated PSA    Essential and other specified forms of tremor 11/25/2012   Gait abnormality 05/17/2020   H/O adenomatous polyp of colon    Hyperlipidemia    Hypertension    Nocturia    OA (osteoarthritis)    Peripheral neuropathy    hx of, none current as of 08-04-13   Peyronie disease    S/P coronary artery stent placement OCT 1999 OF LAD   Tremor, hereditary, benign MILD RIGHT HAND   Past Surgical History:  Procedure Laterality Date   CARDIOVASCULAR STRESS TEST  11-01-2010 DR Verdis Prime   NORMAL PERFUSION STUDY/ EF 64%/ NO ISCHEMIA   cataract surgery  Bilateral feburary 2020   with lens placement    COLONOSCOPY WITH PROPOFOL N/A 09/24/2012   Procedure: COLONOSCOPY WITH PROPOFOL;  Surgeon: Charolett Bumpers, MD;  Location: [facility];  Service: Endoscopy;  Laterality: N/A;   CORONARY ANGIOPLASTY WITH STENT PLACEMENT  OCT 1999   STENT OF LAD   CORONARY STENT  INTERVENTION N/A 02/14/2019   Procedure: CORONARY STENT INTERVENTION;  Surgeon: Lyn Records, MD;  Location: [facility];  Service: Cardiovascular;  Laterality: N/A;   CYSTOSCOPY WITH URETHRAL DILATATION  03/12/2017   Procedure: CYSTOSCOPY WITH URETHRAL DILATATION;  Surgeon: Ollen Gross, MD;  Location: [facility];  Service: Orthopedics;;  [name], Resident Assisting   CYSTOSCOPY WITH URETHRAL DILATATION N/A 10/03/2018   Procedure: CYSTOSCOPY WITH URETHRAL BALLOON DILATATION WITH BILATERAL RETROGRADE PYELOGRAPHY;  Surgeon: Heloise Purpura, MD;  Location: [facility];  Service: Urology;  Laterality: N/A;   CYSTOSCOPY WITH URETHRAL DILATATION N/A 12/30/2020   Procedure: CYSTOSCOPY WITH BALLOON DILATATION OF URETHRAL STRICTURE;  Surgeon: Heloise Purpura, MD;  Location: [facility];  Service: Urology;  Laterality: N/A;   LAPAROSCOPIC INGUINAL HERNIA REPAIR Bilateral 01-10-2004   W/ MESH   LEFT HEART CATH AND CORONARY ANGIOGRAPHY N/A 02/13/2019   Procedure: LEFT HEART CATH AND CORONARY ANGIOGRAPHY;  Surgeon: Lyn Records, MD;  Location: [facility];  Service: Cardiovascular;  Laterality: N/A;   neck benign removed from neck  yrs ago   PROSTATE BIOPSY N/A 07/12/2012   Procedure: PROSTATE BIOPSY AND ULTRASOUND;  Surgeon: Kathi Ludwig, MD;  Location[facility];  Service: Urology;  Laterality: N/A;   PROSTATE SURGERY  2002   tuna   REMOVAL LEFT NECK LYMPH NODE  1996   TONSILLECTOMY  CHILD   TOTAL KNEE ARTHROPLASTY Right 03/12/2017   Procedure: RIGHT TOTAL KNEE ARTHROPLASTY;  Surgeon: Ollen Gross, MD;  Location: [facility];  Service: Orthopedics;  Laterality: Right;  Adductor Block   TRANSURETHRAL RESECTION OF PROSTATE N/A 08/08/2013   Procedure: TRANSURETHRAL RESECTION OF THE PROSTATE WITH GYRUS INSTRUMENTS;  Surgeon: Kathi Ludwig, MD;  Location: [facility];  Service: Urology;  Laterality: N/A;   TRANSURETHRAL RESECTION OF PROSTATE     Patient Active Problem List   Diagnosis  Date Noted   Adult attention deficit disorder 06/04/2020   Chronic lymphocytic leukemia (HCC) 06/04/2020   Conductive hearing loss, bilateral 06/04/2020   Crohn's ileitis (HCC) 06/04/2020   Disorder of musculoskeletal system 06/04/2020   Erectile dysfunction 06/04/2020   Gastroesophageal reflux disease 06/04/2020   Gout 06/04/2020   History of adenomatous polyp of colon 06/04/2020   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   Hypothyroidism 06/04/2020   Increased frequency of urination 06/04/2020   Male hypogonadism 06/04/2020   Muscle pain 06/04/2020   Nocturia 06/04/2020   Obesity 06/04/2020   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   Osteopenia 06/04/2020   Peripheral neurogenic pain 06/04/2020   Pure hypercholesterolemia 06/04/2020   Recurrent falls 06/04/2020   Unspecified kyphosis, thoracic region 06/04/2020   Vitamin B12 deficiency 06/04/2020   Vitamin D deficiency 06/04/2020   Gait abnormality 05/17/2020   Ataxia 03/16/2020   Angina pectoris (HCC) 02/13/2019   Pseudophakia of both eyes 08/01/2018   History of total knee arthroplasty 03/29/2017   Stiffness of right knee 03/16/2017   OA (osteoarthritis) of knee 03/12/2017   Tremor, essential 12/03/2015   Essential hypertension 12/09/2014   Coronary artery disease involving native heart 12/08/2013   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   Hyperlipidemia 12/08/2013   Benign prostatic hypertrophy 08/08/2013   Essential and other specified forms of tremor 11/25/2012    ONSET DATE: tremor has been present for years  REFERRING DIAG: R26.89 (ICD-10-CM) - Balance problem G25.0 (ICD-10-CM) - Essential tremor  THERAPY DIAG:  Unsteadiness on feet  Other abnormalities of gait and mobility  Abnormal posture  Muscle weakness (generalized)  History of falling  Rationale for Evaluation and Treatment: Rehabilitation  SUBJECTIVE:                                                                                                                                                                                              SUBJECTIVE STATEMENT: Hip is feeling much better  Pt accompanied by: self  PERTINENT HISTORY: underlying complex medical history of coronary artery disease with status post stenting, hypertension, hyperlipidemia, elevated PSA, urethral stricture, Crohn's disease, CLL, ADHD, vitamin B12 deficiency, , R TKR, osteoarthritis, neuropathy, and tremor,  PAIN:  Are you having pain? Yes, left lateral hip 1-2/10  PRECAUTIONS: Fall  WEIGHT BEARING RESTRICTIONS: No  FALLS: Has patient fallen in last 6 months? No, reports fall 9 months ago, but fearful of falling  LIVING ENVIRONMENT: Lives with: lives with their spouse Lives in: House/apartment Stairs: Yes: Internal: flight steps; on right going up and External: yes steps; can reach both Has following equipment at home: None  PLOF: Independent  PATIENT GOALS: improve balance, upper body strength. Not willing to use AD  OBJECTIVE:   TODAY'S TREATMENT: 08/10/22 Activity Comments  Dynamic balance warm-up 4x25 ft -retrowalk -tandem walk -sidestep  Half kneeling -PNF chops -overhead lift  Tall kneeling -hip hinge -PMF chops  Reverse lunge 1x10 -vertical HR for support  Corner balance -multisensory balance drills  LE stretching all major muscle groups 2x60 sec         PATIENT EDUCATION: Education details: assessment findings, rationale for PT intervention, HEP initiation Person educated: Patient Education method: Explanation, Demonstration, and Handouts Education comprehension: verbalized understanding, returned demonstration, and needs further education  HOME EXERCISE PROGRAM: Access Code: [email or web address] URL: [email or web address] Date: 04/10/2022 Prepared by: Shary Decamp  Exercises - Standing Balance in Corner  - 1 x daily - 7 x weekly - 3 sets - 30 sec hold - Standing Balance in Corner with Eyes Closed  - 1  x daily - 7 x weekly - 3 sets - 30 sec hold - Corner Balance Feet Together: Eyes Open With Head Turns  - 1 x daily - 7 x weekly - 3 sets - 3-5 reps - Corner Balance Feet Together: Eyes Closed With Head Turns  - 1 x daily - 7 x weekly - 3 sets - 3-5 reps - Seated Cervical Retraction  - 1 x daily - 7 x weekly - 3 sets - 10 reps - 3-5 sec hold - Standing Cervical Retraction  - 1 x daily - 7 x weekly - 3 sets - 10 reps - Supine Cervical Retraction with Towel  - 1 x daily - 7 x weekly - 1-3 sets - 10 reps - 3-5 sec hold - Lat Pull Down  - 1 x daily - 7 x weekly - 3 sets - 10 reps - Seated Row Cable Machine  - 1 x daily - 7 x weekly - 3 sets - 10 reps - Deadlift with Pelvic Contraction  - 1 x daily - 7 x weekly - 3 sets - 10 reps - Full Leg Press  - 1 x daily - 7 x weekly - 3 sets - 10 reps - Seated Chest Press with Dumbbells  - 1 x daily - 7 x weekly - 3 sets - 10 reps - Half Kneeling Diagonal Lift with Narrow Balance  - 1 x daily - 7 x weekly - 2-3 sets - 10 reps - Assisted Lunge with TRX  - 2-3 sets - 10 reps - Squat with TRX  - 1 x daily - 7 x weekly - 2-3 sets - 10 reps  VESTIBULAR ASSESSMENT   GENERAL OBSERVATION: wears progressive lens    SYMPTOM BEHAVIOR:   Subjective history: poor balance   Non-Vestibular symptoms: changes in hearing   Type of dizziness: Unsteady with head/body turns   Frequency: varies   Duration: varies   Aggravating factors:  Relieving factors: slow movements   Progression of symptoms: insidious   OCULOMOTOR EXAM:   Ocular Alignment: normal    Ocular ROM: No Limitations    Spontaneous Nystagmus: absent    Gaze-Induced Nystagmus: absent    Smooth Pursuits: saccades, more towards right tracking    Saccades: overshoots left> right     Convergence/Divergence: 8 cm    Head Impulse Test: difficult to assess due to neck tension, possibly +HIT to the left>right      VESTIBULAR - OCULAR REFLEX:    Slow VOR: Normal   VOR Cancellation:  Normal      Dynamic Visual Acuity: Not able to be assessed      POSITIONAL TESTING: NT due to no c/o positional vertigo         DIAGNOSTIC FINDINGS:   COGNITION: Overall cognitive status: Within functional limits for tasks assessed   SENSATION: Not tested  COORDINATION:   EDEMA:    MUSCLE TONE: resting and action tremor noted, UE > LE   MUSCLE LENGTH: NT  DTRs:    POSTURE: forward head  LOWER EXTREMITY ROM:     Active  Right Eval Left Eval  Hip flexion    Hip extension    Hip abduction    Hip adduction    Hip internal rotation    Hip external rotation    Knee flexion    Knee extension    Ankle dorsiflexion 10 10  Ankle plantarflexion    Ankle inversion    Ankle eversion     (Blank rows = not tested)  LOWER EXTREMITY MMT:    MMT Right Eval Left Eval  Hip flexion 5 4  Hip extension    Hip abduction 5 4  Hip adduction    Hip internal rotation    Hip external rotation    Knee flexion 5 5  Knee extension 5 4  Ankle dorsiflexion 5 5  Ankle plantarflexion    Ankle inversion    Ankle eversion    (Blank rows = not tested)  BED MOBILITY:  NT  TRANSFERS: Assistive device utilized: None  Sit to stand: Complete Independence Stand to sit: Complete Independence Chair to chair: Complete Independence Floor:  NT  RAMP:  NT  CURB:  NT  STAIRS: Level of Assistance: Modified independence Stair Negotiation Technique: Alternating Pattern  with Single Rail on Right Single Rail on Left Number of Stairs: 15  Height of Stairs: 4-6  Comments:   GAIT: Gait pattern:  unsteady  tremoring presnet Distance walked:  Assistive device utilized: None Level of assistance: Complete Independence and Modified independence Comments: reduced velocity  FUNCTIONAL TESTS:  Timed up and go (TUG): NT 10 meter walk test: 2.66 ft/sec Berg Balance Scale: NT Functional gait assessment: 16/30  M-CTSIB  Condition 1: Firm Surface, EO 30 Sec, Normal and Mild  Sway  Condition 2: Firm Surface, EC 10 Sec, Severe Sway  Condition 3: Foam Surface, EO 20 Sec, Moderate and Severe Sway  Condition 4: Foam Surface, EC 8 Sec, Severe Sway    PATIENT SURVEYS:        GOALS: Goals reviewed with patient? Yes  SHORT TERM GOALS: Target date: 08/09/2022     Patient will be independent in HEP to improve functional outcomes Baseline: Goal status: MET  2.  Demo improved balance and safety with activities such as standing/showering maintaining normal-mild sway per 30 sec condition 2 M-CTSIB Baseline: 10 sec, severe sway/retro LOB Goal status: MET  3.  Demo improved balance and  safety with activities such as standing/showering maintaining normal-mild sway per 30 sec condition 4 M-CTSIB  Baseline: UNABLE  Goal status: IN PROGRESS  LONG TERM GOALS: Target date: 09/06/2022        Demo independence with advanced HEP that could include, but not limited to: strength, balance, coordination, posture, and ambulation practices Baseline:  Goal status: IN PROGRESS  2.  Demo 5/5 LLE strength to improve single limb support for improved gait stability Baseline: 4/5 LLE; (05/17/22) 4+/5 Goal status: IN PROGRESS  3.  Demo improved balance and reduced risk for falls per score 25/30 Functional Gait Assessment Baseline: 16/30; (05/17/22) 20/30; 18/30 (07/12/22) Goal status: IN PROGRESS   4. Demo improved balance and postural control per score 22/28 Mini-BESTest  Baseline: 15/28  Goal status: INITIAL    ASSESSMENT:  CLINICAL IMPRESSION: Continued with program development to complement HEP and gym program with emphasis on compound, closed chain movements to improve strength, motor control, and capabilities for getting up/down from floor.  Demonstrating improved postural stability under multi-sensory balance drills performed in corner with 25% errors over 4 min time period.  Continued sessions indicated to refine HEP and gym routine to prepare for transition to indep  performance  OBJECTIVE IMPAIRMENTS: Abnormal gait, decreased activity tolerance, decreased balance, decreased coordination, decreased knowledge of use of DME, difficulty walking, decreased strength, impaired perceived functional ability, and postural dysfunction.   ACTIVITY LIMITATIONS: carrying, lifting, squatting, stairs, transfers, and locomotion level  PARTICIPATION LIMITATIONS: interpersonal relationship, community activity, and yard work  PERSONAL FACTORS: Age, Time since onset of injury/illness/exacerbation, and 1-2 comorbidities: essential tremor, Right TKR  are also affecting patient's functional outcome.   REHAB POTENTIAL: Good  CLINICAL DECISION MAKING: Evolving/moderate complexity  EVALUATION COMPLEXITY: Moderate  PLAN:  PT FREQUENCY: 1x/week  PT DURATION: 6 weeks  PLANNED INTERVENTIONS: Therapeutic exercises, Therapeutic activity, Neuromuscular re-education, Balance training, Gait training, Patient/Family education, Self Care, Joint mobilization, Stair training, Vestibular training, Canalith repositioning, Orthotic/Fit training, DME instructions, Aquatic Therapy, Dry Needling, Electrical stimulation, Spinal mobilization, Cryotherapy, Moist heat, Taping, Ionotophoresis 4mg /ml Dexamethasone, and Manual therapy  PLAN FOR NEXT SESSION: HEP review, progress balance activities   9:29 AM, 08/10/22 M. Shary Decamp, PT, DPT Physical Therapist- [place] Office Number: [phone]

## 2022-08-16 ENCOUNTER — Ambulatory Visit: Payer: Medicare Other

## 2022-08-16 DIAGNOSIS — R293 Abnormal posture: Secondary | ICD-10-CM

## 2022-08-16 DIAGNOSIS — R2681 Unsteadiness on feet: Secondary | ICD-10-CM

## 2022-08-16 DIAGNOSIS — Z9181 History of falling: Secondary | ICD-10-CM

## 2022-08-16 DIAGNOSIS — M6281 Muscle weakness (generalized): Secondary | ICD-10-CM | POA: Diagnosis not present

## 2022-08-16 DIAGNOSIS — R2689 Other abnormalities of gait and mobility: Secondary | ICD-10-CM

## 2022-08-16 NOTE — Therapy (Signed)
OUTPATIENT PHYSICAL THERAPY NEURO TREATMENT   Patient Name: Cole Martinez MRN: [id number] DOB:[birth year], 77 y.o., male Today's Date: 08/16/2022   PCP: Marden Noble, MD REFERRING PROVIDER: Glean Salvo, NP    END OF SESSION:  PT End of Session - 08/16/22 0849     Visit Number 15    Number of Visits 18    Date for PT Re-Evaluation 09/06/22    Authorization Type Medicare A&B/ [place]    Progress Note Due on Visit 20    PT Start Time 0845    PT Stop Time 0930    PT Time Calculation (min) 45 min             Past Medical History:  Diagnosis Date   ADHD (attention deficit hyperactivity disorder)    B12 deficiency    BPH (benign prostatic hypertrophy)    Chronic lymphocytic leukemia (CLL), T-cell (HCC) DX 1996--  ONCOLOGIST-  DR Truett Perna   PT IS ASYMPTOMATIC--- LAST CBC W/ DIFF 06-24-2012 STABLE   Coronary artery disease CARDIOLOGIST- DR Verdis Prime   S/P STENTING LAD 1999 // Myoview 01/2019: EF 62, normal perfusion; Low Risk   Crohn's disease of ileum (HCC) SINCE 1988   ED (erectile dysfunction)    Elevated PSA    Essential and other specified forms of tremor 11/25/2012   Gait abnormality 05/17/2020   H/O adenomatous polyp of colon    Hyperlipidemia    Hypertension    Nocturia    OA (osteoarthritis)    Peripheral neuropathy    hx of, none current as of 08-04-13   Peyronie disease    S/P coronary artery stent placement OCT 1999 OF LAD   Tremor, hereditary, benign MILD RIGHT HAND   Past Surgical History:  Procedure Laterality Date   CARDIOVASCULAR STRESS TEST  11-01-2010 DR Verdis Prime   NORMAL PERFUSION STUDY/ EF 64%/ NO ISCHEMIA   cataract surgery  Bilateral feburary 2020   with lens placement    COLONOSCOPY WITH PROPOFOL N/A 09/24/2012   Procedure: COLONOSCOPY WITH PROPOFOL;  Surgeon: Charolett Bumpers, MD;  Location: [facility];  Service: Endoscopy;  Laterality: N/A;   CORONARY ANGIOPLASTY WITH STENT PLACEMENT  OCT 1999   STENT OF LAD   CORONARY STENT  INTERVENTION N/A 02/14/2019   Procedure: CORONARY STENT INTERVENTION;  Surgeon: Lyn Records, MD;  Location: [facility];  Service: Cardiovascular;  Laterality: N/A;   CYSTOSCOPY WITH URETHRAL DILATATION  03/12/2017   Procedure: CYSTOSCOPY WITH URETHRAL DILATATION;  Surgeon: Ollen Gross, MD;  Location: [facility];  Service: Orthopedics;;  [name], Resident Assisting   CYSTOSCOPY WITH URETHRAL DILATATION N/A 10/03/2018   Procedure: CYSTOSCOPY WITH URETHRAL BALLOON DILATATION WITH BILATERAL RETROGRADE PYELOGRAPHY;  Surgeon: Heloise Purpura, MD;  Location: [facility];  Service: Urology;  Laterality: N/A;   CYSTOSCOPY WITH URETHRAL DILATATION N/A 12/30/2020   Procedure: CYSTOSCOPY WITH BALLOON DILATATION OF URETHRAL STRICTURE;  Surgeon: Heloise Purpura, MD;  Location: [facility];  Service: Urology;  Laterality: N/A;   LAPAROSCOPIC INGUINAL HERNIA REPAIR Bilateral 01-10-2004   W/ MESH   LEFT HEART CATH AND CORONARY ANGIOGRAPHY N/A 02/13/2019   Procedure: LEFT HEART CATH AND CORONARY ANGIOGRAPHY;  Surgeon: Lyn Records, MD;  Location: [facility];  Service: Cardiovascular;  Laterality: N/A;   neck benign removed from neck  yrs ago   PROSTATE BIOPSY N/A 07/12/2012   Procedure: PROSTATE BIOPSY AND ULTRASOUND;  Surgeon: Kathi Ludwig, MD;  Location: [facility];  Service: Urology;  Laterality: N/A;   PROSTATE SURGERY  2002   tuna   REMOVAL LEFT NECK LYMPH NODE  1996   TONSILLECTOMY  CHILD   TOTAL KNEE ARTHROPLASTY Right 03/12/2017   Procedure: RIGHT TOTAL KNEE ARTHROPLASTY;  Surgeon: Ollen Gross, MD;  Location: [facility];  Service: Orthopedics;  Laterality: Right;  Adductor Block   TRANSURETHRAL RESECTION OF PROSTATE N/A 08/08/2013   Procedure: TRANSURETHRAL RESECTION OF THE PROSTATE WITH GYRUS INSTRUMENTS;  Surgeon: Kathi Ludwig, MD;  Location: [facility];  Service: Urology;  Laterality: N/A;   TRANSURETHRAL RESECTION OF PROSTATE     Patient Active Problem List   Diagnosis  Date Noted   Adult attention deficit disorder 06/04/2020   Chronic lymphocytic leukemia (HCC) 06/04/2020   Conductive hearing loss, bilateral 06/04/2020   Crohn's ileitis (HCC) 06/04/2020   Disorder of musculoskeletal system 06/04/2020   Erectile dysfunction 06/04/2020   Gastroesophageal reflux disease 06/04/2020   Gout 06/04/2020   History of adenomatous polyp of colon 06/04/2020   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   Hypothyroidism 06/04/2020   Increased frequency of urination 06/04/2020   Male hypogonadism 06/04/2020   Muscle pain 06/04/2020   Nocturia 06/04/2020   Obesity 06/04/2020   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   Osteopenia 06/04/2020   Peripheral neurogenic pain 06/04/2020   Pure hypercholesterolemia 06/04/2020   Recurrent falls 06/04/2020   Unspecified kyphosis, thoracic region 06/04/2020   Vitamin B12 deficiency 06/04/2020   Vitamin D deficiency 06/04/2020   Gait abnormality 05/17/2020   Ataxia 03/16/2020   Angina pectoris (HCC) 02/13/2019   Pseudophakia of both eyes 08/01/2018   History of total knee arthroplasty 03/29/2017   Stiffness of right knee 03/16/2017   OA (osteoarthritis) of knee 03/12/2017   Tremor, essential 12/03/2015   Essential hypertension 12/09/2014   Coronary artery disease involving native heart 12/08/2013   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   Hyperlipidemia 12/08/2013   Benign prostatic hypertrophy 08/08/2013   Essential and other specified forms of tremor 11/25/2012    ONSET DATE: tremor has been present for years  REFERRING DIAG: R26.89 (ICD-10-CM) - Balance problem G25.0 (ICD-10-CM) - Essential tremor  THERAPY DIAG:  Unsteadiness on feet  Other abnormalities of gait and mobility  Abnormal posture  Muscle weakness (generalized)  History of falling  Rationale for Evaluation and Treatment: Rehabilitation  SUBJECTIVE:                                                                                                                                                                                              SUBJECTIVE STATEMENT: Hip is feeling much better  Pt accompanied by: self  PERTINENT HISTORY: underlying complex medical history of coronary artery disease with status post stenting, hypertension, hyperlipidemia, elevated PSA, urethral stricture, Crohn's disease, CLL, ADHD, vitamin B12 deficiency, , R TKR, osteoarthritis, neuropathy, and tremor,  PAIN:  Are you having pain? Yes, left lateral hip 1-2/10  PRECAUTIONS: Fall  WEIGHT BEARING RESTRICTIONS: No  FALLS: Has patient fallen in last 6 months? No, reports fall 9 months ago, but fearful of falling  LIVING ENVIRONMENT: Lives with: lives with their spouse Lives in: House/apartment Stairs: Yes: Internal: flight steps; on right going up and External: yes steps; can reach both Has following equipment at home: None  PLOF: Independent  PATIENT GOALS: improve balance, upper body strength. Not willing to use AD  OBJECTIVE:   TODAY'S TREATMENT: 08/16/22 Activity Comments  Multisensory static balance   Single leg balance Techniques to improve left hip abd stability to minimize hip drop--improved carryover with visual  Hip bumps/PNF chops   Ball toss all manner 5.5#  Kicking physioball For weight shift and SLS  Dynamic gait Activities with multitasking     TODAY'S TREATMENT: 08/10/22 Activity Comments  Dynamic balance warm-up 4x25 ft -retrowalk -tandem walk -sidestep  Half kneeling -PNF chops -overhead lift  Tall kneeling -hip hinge -PMF chops  Reverse lunge 1x10 -vertical HR for support  Corner balance -multisensory balance drills  LE stretching all major muscle groups 2x60 sec         PATIENT EDUCATION: Education details: techniques for assessing hip drop Person educated: Patient Education method: Programmer, multimedia, Demonstration, and Handouts Education comprehension: verbalized understanding, returned  demonstration, and needs further education  HOME EXERCISE PROGRAM: Access Code: [email or web address] URL: [email or web address] Date: 04/10/2022 Prepared by: Shary Decamp  Exercises - Standing Balance in Corner  - 1 x daily - 7 x weekly - 3 sets - 30 sec hold - Standing Balance in Corner with Eyes Closed  - 1 x daily - 7 x weekly - 3 sets - 30 sec hold - Corner Balance Feet Together: Eyes Open With Head Turns  - 1 x daily - 7 x weekly - 3 sets - 3-5 reps - Corner Balance Feet Together: Eyes Closed With Head Turns  - 1 x daily - 7 x weekly - 3 sets - 3-5 reps - Seated Cervical Retraction  - 1 x daily - 7 x weekly - 3 sets - 10 reps - 3-5 sec hold - Standing Cervical Retraction  - 1 x daily - 7 x weekly - 3 sets - 10 reps - Supine Cervical Retraction with Towel  - 1 x daily - 7 x weekly - 1-3 sets - 10 reps - 3-5 sec hold - Lat Pull Down  - 1 x daily - 7 x weekly - 3 sets - 10 reps - Seated Row Cable Machine  - 1 x daily - 7 x weekly - 3 sets - 10 reps - Deadlift with Pelvic Contraction  - 1 x daily - 7 x weekly - 3 sets - 10 reps - Full Leg Press  - 1 x daily - 7 x weekly - 3 sets - 10 reps - Seated Chest Press with Dumbbells  - 1 x daily - 7 x weekly - 3 sets - 10 reps - Half Kneeling Diagonal Lift with Narrow Balance  - 1 x daily - 7 x weekly - 2-3 sets - 10 reps - Assisted Lunge with TRX  - 2-3 sets - 10 reps - Squat with TRX  - 1 x daily - 7 x weekly -  2-3 sets - 10 reps  VESTIBULAR ASSESSMENT   GENERAL OBSERVATION: wears progressive lens    SYMPTOM BEHAVIOR:   Subjective history: poor balance   Non-Vestibular symptoms: changes in hearing   Type of dizziness: Unsteady with head/body turns   Frequency: varies   Duration: varies   Aggravating factors:    Relieving factors: slow movements   Progression of symptoms: insidious   OCULOMOTOR EXAM:   Ocular Alignment: normal    Ocular ROM: No Limitations    Spontaneous Nystagmus: absent    Gaze-Induced Nystagmus:  absent    Smooth Pursuits: saccades, more towards right tracking    Saccades: overshoots left> right     Convergence/Divergence: 8 cm    Head Impulse Test: difficult to assess due to neck tension, possibly +HIT to the left>right      VESTIBULAR - OCULAR REFLEX:    Slow VOR: Normal   VOR Cancellation: Normal      Dynamic Visual Acuity: Not able to be assessed      POSITIONAL TESTING: NT due to no c/o positional vertigo         DIAGNOSTIC FINDINGS:   COGNITION: Overall cognitive status: Within functional limits for tasks assessed   SENSATION: Not tested  COORDINATION:   EDEMA:    MUSCLE TONE: resting and action tremor noted, UE > LE   MUSCLE LENGTH: NT  DTRs:    POSTURE: forward head  LOWER EXTREMITY ROM:     Active  Right Eval Left Eval  Hip flexion    Hip extension    Hip abduction    Hip adduction    Hip internal rotation    Hip external rotation    Knee flexion    Knee extension    Ankle dorsiflexion 10 10  Ankle plantarflexion    Ankle inversion    Ankle eversion     (Blank rows = not tested)  LOWER EXTREMITY MMT:    MMT Right Eval Left Eval  Hip flexion 5 4  Hip extension    Hip abduction 5 4  Hip adduction    Hip internal rotation    Hip external rotation    Knee flexion 5 5  Knee extension 5 4  Ankle dorsiflexion 5 5  Ankle plantarflexion    Ankle inversion    Ankle eversion    (Blank rows = not tested)  BED MOBILITY:  NT  TRANSFERS: Assistive device utilized: None  Sit to stand: Complete Independence Stand to sit: Complete Independence Chair to chair: Complete Independence Floor:  NT  RAMP:  NT  CURB:  NT  STAIRS: Level of Assistance: Modified independence Stair Negotiation Technique: Alternating Pattern  with Single Rail on Right Single Rail on Left Number of Stairs: 15  Height of Stairs: 4-6  Comments:   GAIT: Gait pattern:  unsteady  tremoring presnet Distance walked:  Assistive device  utilized: None Level of assistance: Complete Independence and Modified independence Comments: reduced velocity  FUNCTIONAL TESTS:  Timed up and go (TUG): NT 10 meter walk test: 2.66 ft/sec Berg Balance Scale: NT Functional gait assessment: 16/30  M-CTSIB  Condition 1: Firm Surface, EO 30 Sec, Normal and Mild Sway  Condition 2: Firm Surface, EC 10 Sec, Severe Sway  Condition 3: Foam Surface, EO 20 Sec, Moderate and Severe Sway  Condition 4: Foam Surface, EC 8 Sec, Severe Sway    PATIENT SURVEYS:        GOALS: Goals reviewed with patient? Yes  SHORT TERM GOALS: Target date:  08/09/2022     Patient will be independent in HEP to improve functional outcomes Baseline: Goal status: MET  2.  Demo improved balance and safety with activities such as standing/showering maintaining normal-mild sway per 30 sec condition 2 M-CTSIB Baseline: 10 sec, severe sway/retro LOB Goal status: MET  3.  Demo improved balance and safety with activities such as standing/showering maintaining normal-mild sway per 30 sec condition 4 M-CTSIB  Baseline: UNABLE  Goal status: IN PROGRESS  LONG TERM GOALS: Target date: 09/06/2022        Demo independence with advanced HEP that could include, but not limited to: strength, balance, coordination, posture, and ambulation practices Baseline:  Goal status: IN PROGRESS  2.  Demo 5/5 LLE strength to improve single limb support for improved gait stability Baseline: 4/5 LLE; (05/17/22) 4+/5 Goal status: IN PROGRESS  3.  Demo improved balance and reduced risk for falls per score 25/30 Functional Gait Assessment Baseline: 16/30; (05/17/22) 20/30; 18/30 (07/12/22) Goal status: IN PROGRESS   4. Demo improved balance and postural control per score 22/28 Mini-BESTest  Baseline: 15/28  Goal status: INITIAL    ASSESSMENT:  CLINICAL IMPRESSION: Session with focus on improving single limb support and dynamic balance to reduce risk for falls.  He has a tendency  for left Trendelenberg in single stance and able to improve/correct with use of mirror and hands on hips for feedback.  Increased instances of left Trendelenberg with multitasking and frequent scissoring steps and LOB with these demands.  Continued sessions to progress POC details to improve balance and postural stability  OBJECTIVE IMPAIRMENTS: Abnormal gait, decreased activity tolerance, decreased balance, decreased coordination, decreased knowledge of use of DME, difficulty walking, decreased strength, impaired perceived functional ability, and postural dysfunction.   ACTIVITY LIMITATIONS: carrying, lifting, squatting, stairs, transfers, and locomotion level  PARTICIPATION LIMITATIONS: interpersonal relationship, community activity, and yard work  PERSONAL FACTORS: Age, Time since onset of injury/illness/exacerbation, and 1-2 comorbidities: essential tremor, Right TKR  are also affecting patient's functional outcome.   REHAB POTENTIAL: Good  CLINICAL DECISION MAKING: Evolving/moderate complexity  EVALUATION COMPLEXITY: Moderate  PLAN:  PT FREQUENCY: 1x/week  PT DURATION: 6 weeks  PLANNED INTERVENTIONS: Therapeutic exercises, Therapeutic activity, Neuromuscular re-education, Balance training, Gait training, Patient/Family education, Self Care, Joint mobilization, Stair training, Vestibular training, Canalith repositioning, Orthotic/Fit training, DME instructions, Aquatic Therapy, Dry Needling, Electrical stimulation, Spinal mobilization, Cryotherapy, Moist heat, Taping, Ionotophoresis 4mg /ml Dexamethasone, and Manual therapy  PLAN FOR NEXT SESSION: HEP review, progress balance activities   8:49 AM, 08/16/22 M. Shary Decamp, PT, DPT Physical Therapist- [place] Office Number: [phone]

## 2022-08-23 ENCOUNTER — Ambulatory Visit: Payer: Medicare Other

## 2022-08-23 DIAGNOSIS — R293 Abnormal posture: Secondary | ICD-10-CM | POA: Diagnosis not present

## 2022-08-23 DIAGNOSIS — R2681 Unsteadiness on feet: Secondary | ICD-10-CM

## 2022-08-23 DIAGNOSIS — M6281 Muscle weakness (generalized): Secondary | ICD-10-CM

## 2022-08-23 DIAGNOSIS — R2689 Other abnormalities of gait and mobility: Secondary | ICD-10-CM | POA: Diagnosis not present

## 2022-08-23 DIAGNOSIS — Z9181 History of falling: Secondary | ICD-10-CM | POA: Diagnosis not present

## 2022-08-23 NOTE — Therapy (Signed)
OUTPATIENT PHYSICAL THERAPY NEURO TREATMENT   Patient Name: Cole Martinez MRN: [id number] DOB:[birth year], 77 y.o., male Today's Date: 08/23/2022   PCP: Marden Noble, MD REFERRING PROVIDER: Glean Salvo, NP    END OF SESSION:  PT End of Session - 08/23/22 0846     Visit Number 16    Number of Visits 18    Date for PT Re-Evaluation 09/06/22    Authorization Type Medicare A&B/ [place]    Progress Note Due on Visit 20    PT Start Time 0845    PT Stop Time 0930    PT Time Calculation (min) 45 min             Past Medical History:  Diagnosis Date   ADHD (attention deficit hyperactivity disorder)    B12 deficiency    BPH (benign prostatic hypertrophy)    Chronic lymphocytic leukemia (CLL), T-cell (HCC) DX 1996--  ONCOLOGIST-  DR Truett Perna   PT IS ASYMPTOMATIC--- LAST CBC W/ DIFF 06-24-2012 STABLE   Coronary artery disease CARDIOLOGIST- DR Verdis Prime   S/P STENTING LAD 1999 // Myoview 01/2019: EF 62, normal perfusion; Low Risk   Crohn's disease of ileum (HCC) SINCE 1988   ED (erectile dysfunction)    Elevated PSA    Essential and other specified forms of tremor 11/25/2012   Gait abnormality 05/17/2020   H/O adenomatous polyp of colon    Hyperlipidemia    Hypertension    Nocturia    OA (osteoarthritis)    Peripheral neuropathy    hx of, none current as of 08-04-13   Peyronie disease    S/P coronary artery stent placement OCT 1999 OF LAD   Tremor, hereditary, benign MILD RIGHT HAND   Past Surgical History:  Procedure Laterality Date   CARDIOVASCULAR STRESS TEST  11-01-2010 DR Verdis Prime   NORMAL PERFUSION STUDY/ EF 64%/ NO ISCHEMIA   cataract surgery  Bilateral feburary 2020   with lens placement    COLONOSCOPY WITH PROPOFOL N/A 09/24/2012   Procedure: COLONOSCOPY WITH PROPOFOL;  Surgeon: Charolett Bumpers, MD;  Location: [facility];  Service: Endoscopy;  Laterality: N/A;   CORONARY ANGIOPLASTY WITH STENT PLACEMENT  OCT 1999   STENT OF LAD   CORONARY STENT  INTERVENTION N/A 02/14/2019   Procedure: CORONARY STENT INTERVENTION;  Surgeon: Lyn Records, MD;  Location: [facility];  Service: Cardiovascular;  Laterality: N/A;   CYSTOSCOPY WITH URETHRAL DILATATION  03/12/2017   Procedure: CYSTOSCOPY WITH URETHRAL DILATATION;  Surgeon: Ollen Gross, MD;  Location: [facility];  Service: Orthopedics;;  [name], Resident Assisting   CYSTOSCOPY WITH URETHRAL DILATATION N/A 10/03/2018   Procedure: CYSTOSCOPY WITH URETHRAL BALLOON DILATATION WITH BILATERAL RETROGRADE PYELOGRAPHY;  Surgeon: Heloise Purpura, MD;  Location: [facility];  Service: Urology;  Laterality: N/A;   CYSTOSCOPY WITH URETHRAL DILATATION N/A 12/30/2020   Procedure: CYSTOSCOPY WITH BALLOON DILATATION OF URETHRAL STRICTURE;  Surgeon: Heloise Purpura, MD;  Location: [facility];  Service: Urology;  Laterality: N/A;   LAPAROSCOPIC INGUINAL HERNIA REPAIR Bilateral 01-10-2004   W/ MESH   LEFT HEART CATH AND CORONARY ANGIOGRAPHY N/A 02/13/2019   Procedure: LEFT HEART CATH AND CORONARY ANGIOGRAPHY;  Surgeon: Lyn Records, MD;  Location: [facility];  Service: Cardiovascular;  Laterality: N/A;   neck benign removed from neck  yrs ago   PROSTATE BIOPSY N/A 07/12/2012   Procedure: PROSTATE BIOPSY AND ULTRASOUND;  Surgeon: Kathi Ludwig, MD;  Location: [facility];  Service: Urology;  Laterality: N/A;   PROSTATE SURGERY  2002   tuna   REMOVAL LEFT NECK LYMPH NODE  1996   TONSILLECTOMY  CHILD   TOTAL KNEE ARTHROPLASTY Right 03/12/2017   Procedure: RIGHT TOTAL KNEE ARTHROPLASTY;  Surgeon: Ollen Gross, MD;  Location: [facility];  Service: Orthopedics;  Laterality: Right;  Adductor Block   TRANSURETHRAL RESECTION OF PROSTATE N/A 08/08/2013   Procedure: TRANSURETHRAL RESECTION OF THE PROSTATE WITH GYRUS INSTRUMENTS;  Surgeon: Kathi Ludwig, MD;  Location: [facility];  Service: Urology;  Laterality: N/A;   TRANSURETHRAL RESECTION OF PROSTATE     Patient Active Problem List   Diagnosis  Date Noted   Adult attention deficit disorder 06/04/2020   Chronic lymphocytic leukemia (HCC) 06/04/2020   Conductive hearing loss, bilateral 06/04/2020   Crohn's ileitis (HCC) 06/04/2020   Disorder of musculoskeletal system 06/04/2020   Erectile dysfunction 06/04/2020   Gastroesophageal reflux disease 06/04/2020   Gout 06/04/2020   History of adenomatous polyp of colon 06/04/2020   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   Hypothyroidism 06/04/2020   Increased frequency of urination 06/04/2020   Male hypogonadism 06/04/2020   Muscle pain 06/04/2020   Nocturia 06/04/2020   Obesity 06/04/2020   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   Osteopenia 06/04/2020   Peripheral neurogenic pain 06/04/2020   Pure hypercholesterolemia 06/04/2020   Recurrent falls 06/04/2020   Unspecified kyphosis, thoracic region 06/04/2020   Vitamin B12 deficiency 06/04/2020   Vitamin D deficiency 06/04/2020   Gait abnormality 05/17/2020   Ataxia 03/16/2020   Angina pectoris (HCC) 02/13/2019   Pseudophakia of both eyes 08/01/2018   History of total knee arthroplasty 03/29/2017   Stiffness of right knee 03/16/2017   OA (osteoarthritis) of knee 03/12/2017   Tremor, essential 12/03/2015   Essential hypertension 12/09/2014   Coronary artery disease involving native heart 12/08/2013   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   Hyperlipidemia 12/08/2013   Benign prostatic hypertrophy 08/08/2013   Essential and other specified forms of tremor 11/25/2012    ONSET DATE: tremor has been present for years  REFERRING DIAG: R26.89 (ICD-10-CM) - Balance problem G25.0 (ICD-10-CM) - Essential tremor  THERAPY DIAG:  Unsteadiness on feet  Other abnormalities of gait and mobility  Abnormal posture  Muscle weakness (generalized)  Rationale for Evaluation and Treatment: Rehabilitation  SUBJECTIVE:                                                                                                                                                                                              SUBJECTIVE STATEMENT: Hip is feeling much better  Pt accompanied by: self  PERTINENT HISTORY: underlying  complex medical history of coronary artery disease with status post stenting, hypertension, hyperlipidemia, elevated PSA, urethral stricture, Crohn's disease, CLL, ADHD, vitamin B12 deficiency, , R TKR, osteoarthritis, neuropathy, and tremor,  PAIN:  Are you having pain? Yes, left lateral hip 1-2/10  PRECAUTIONS: Fall  WEIGHT BEARING RESTRICTIONS: No  FALLS: Has patient fallen in last 6 months? No, reports fall 9 months ago, but fearful of falling  LIVING ENVIRONMENT: Lives with: lives with their spouse Lives in: House/apartment Stairs: Yes: Internal: flight steps; on right going up and External: yes steps; can reach both Has following equipment at home: None  PLOF: Independent  PATIENT GOALS: improve balance, upper body strength. Not willing to use AD  OBJECTIVE:   TODAY'S TREATMENT: 08/23/22 Activity Comments  Gait training Outdoors over grassy surface w/ cane  Multisensory balance   Dynamic step-ups   Single leg balance   Unilat LE strength         TODAY'S TREATMENT: 08/16/22 Activity Comments  Multisensory static balance   Single leg balance Techniques to improve left hip abd stability to minimize hip drop--improved carryover with visual  Hip bumps/PNF chops   Ball toss all manner 5.5#  Kicking physioball For weight shift and SLS  Dynamic gait Activities with multitasking             PATIENT EDUCATION: Education details: techniques for assessing hip drop Person educated: Patient Education method: Programmer, multimedia, Facilities manager, and Handouts Education comprehension: verbalized understanding, returned demonstration, and needs further education  HOME EXERCISE PROGRAM: Access Code: [email or web address] URL: [email or web address] Date: 04/10/2022 Prepared by: Shary Decamp  Exercises - Standing Balance in Corner  - 1 x daily - 7 x weekly - 3 sets - 30 sec hold - Standing Balance in Corner with Eyes Closed  - 1 x daily - 7 x weekly - 3 sets - 30 sec hold - Corner Balance Feet Together: Eyes Open With Head Turns  - 1 x daily - 7 x weekly - 3 sets - 3-5 reps - Corner Balance Feet Together: Eyes Closed With Head Turns  - 1 x daily - 7 x weekly - 3 sets - 3-5 reps - Seated Cervical Retraction  - 1 x daily - 7 x weekly - 3 sets - 10 reps - 3-5 sec hold - Standing Cervical Retraction  - 1 x daily - 7 x weekly - 3 sets - 10 reps - Supine Cervical Retraction with Towel  - 1 x daily - 7 x weekly - 1-3 sets - 10 reps - 3-5 sec hold - Lat Pull Down  - 1 x daily - 7 x weekly - 3 sets - 10 reps - Seated Row Cable Machine  - 1 x daily - 7 x weekly - 3 sets - 10 reps - Deadlift with Pelvic Contraction  - 1 x daily - 7 x weekly - 3 sets - 10 reps - Full Leg Press  - 1 x daily - 7 x weekly - 3 sets - 10 reps - Seated Chest Press with Dumbbells  - 1 x daily - 7 x weekly - 3 sets - 10 reps - Half Kneeling Diagonal Lift with Narrow Balance  - 1 x daily - 7 x weekly - 2-3 sets - 10 reps - Assisted Lunge with TRX  - 2-3 sets - 10 reps - Squat with TRX  - 1 x daily - 7 x weekly - 2-3 sets - 10 reps  VESTIBULAR ASSESSMENT   GENERAL OBSERVATION: wears progressive lens  SYMPTOM BEHAVIOR:   Subjective history: poor balance   Non-Vestibular symptoms: changes in hearing   Type of dizziness: Unsteady with head/body turns   Frequency: varies   Duration: varies   Aggravating factors:    Relieving factors: slow movements   Progression of symptoms: insidious   OCULOMOTOR EXAM:   Ocular Alignment: normal    Ocular ROM: No Limitations    Spontaneous Nystagmus: absent    Gaze-Induced Nystagmus: absent    Smooth Pursuits: saccades, more towards right tracking    Saccades: overshoots left> right     Convergence/Divergence: 8 cm    Head Impulse Test: difficult to  assess due to neck tension, possibly +HIT to the left>right      VESTIBULAR - OCULAR REFLEX:    Slow VOR: Normal   VOR Cancellation: Normal      Dynamic Visual Acuity: Not able to be assessed      POSITIONAL TESTING: NT due to no c/o positional vertigo         DIAGNOSTIC FINDINGS:   COGNITION: Overall cognitive status: Within functional limits for tasks assessed   SENSATION: Not tested  COORDINATION:   EDEMA:    MUSCLE TONE: resting and action tremor noted, UE > LE   MUSCLE LENGTH: NT  DTRs:    POSTURE: forward head  LOWER EXTREMITY ROM:     Active  Right Eval Left Eval  Hip flexion    Hip extension    Hip abduction    Hip adduction    Hip internal rotation    Hip external rotation    Knee flexion    Knee extension    Ankle dorsiflexion 10 10  Ankle plantarflexion    Ankle inversion    Ankle eversion     (Blank rows = not tested)  LOWER EXTREMITY MMT:    MMT Right Eval Left Eval  Hip flexion 5 4  Hip extension    Hip abduction 5 4  Hip adduction    Hip internal rotation    Hip external rotation    Knee flexion 5 5  Knee extension 5 4  Ankle dorsiflexion 5 5  Ankle plantarflexion    Ankle inversion    Ankle eversion    (Blank rows = not tested)  BED MOBILITY:  NT  TRANSFERS: Assistive device utilized: None  Sit to stand: Complete Independence Stand to sit: Complete Independence Chair to chair: Complete Independence Floor:  NT  RAMP:  NT  CURB:  NT  STAIRS: Level of Assistance: Modified independence Stair Negotiation Technique: Alternating Pattern  with Single Rail on Right Single Rail on Left Number of Stairs: 15  Height of Stairs: 4-6  Comments:   GAIT: Gait pattern:  unsteady  tremoring presnet Distance walked:  Assistive device utilized: None Level of assistance: Complete Independence and Modified independence Comments: reduced velocity  FUNCTIONAL TESTS:  Timed up and go (TUG): NT 10 meter walk test:  2.66 ft/sec Berg Balance Scale: NT Functional gait assessment: 16/30  M-CTSIB  Condition 1: Firm Surface, EO 30 Sec, Normal and Mild Sway  Condition 2: Firm Surface, EC 10 Sec, Severe Sway  Condition 3: Foam Surface, EO 20 Sec, Moderate and Severe Sway  Condition 4: Foam Surface, EC 8 Sec, Severe Sway    PATIENT SURVEYS:        GOALS: Goals reviewed with patient? Yes  SHORT TERM GOALS: Target date: 08/09/2022     Patient will be independent in HEP to improve functional outcomes Baseline: Goal status:  MET  2.  Demo improved balance and safety with activities such as standing/showering maintaining normal-mild sway per 30 sec condition 2 M-CTSIB Baseline: 10 sec, severe sway/retro LOB Goal status: MET  3.  Demo improved balance and safety with activities such as standing/showering maintaining normal-mild sway per 30 sec condition 4 M-CTSIB  Baseline: UNABLE  Goal status: IN PROGRESS  LONG TERM GOALS: Target date: 09/06/2022        Demo independence with advanced HEP that could include, but not limited to: strength, balance, coordination, posture, and ambulation practices Baseline:  Goal status: IN PROGRESS  2.  Demo 5/5 LLE strength to improve single limb support for improved gait stability Baseline: 4/5 LLE; (05/17/22) 4+/5 Goal status: IN PROGRESS  3.  Demo improved balance and reduced risk for falls per score 25/30 Functional Gait Assessment Baseline: 16/30; (05/17/22) 20/30; 18/30 (07/12/22) Goal status: IN PROGRESS   4. Demo improved balance and postural control per score 22/28 Mini-BESTest  Baseline: 15/28  Goal status: INITIAL    ASSESSMENT:  CLINICAL IMPRESSION: Continued with actiivties to promote dynamic balance, facilitate righting reactions, and improve safety with obstacle mgmt to improve safety with functional mobility. Emphasis on crossing midline and weight shifting throughout limits of stability.  Training in multisensory for balance and gait to  improve safety with navigating community.  Instances of left hip drop and scissoring step leading to LOB, deficits turning Left > right. Ambulation over grass with cane and modified indep  OBJECTIVE IMPAIRMENTS: Abnormal gait, decreased activity tolerance, decreased balance, decreased coordination, decreased knowledge of use of DME, difficulty walking, decreased strength, impaired perceived functional ability, and postural dysfunction.   ACTIVITY LIMITATIONS: carrying, lifting, squatting, stairs, transfers, and locomotion level  PARTICIPATION LIMITATIONS: interpersonal relationship, community activity, and yard work  PERSONAL FACTORS: Age, Time since onset of injury/illness/exacerbation, and 1-2 comorbidities: essential tremor, Right TKR  are also affecting patient's functional outcome.   REHAB POTENTIAL: Good  CLINICAL DECISION MAKING: Evolving/moderate complexity  EVALUATION COMPLEXITY: Moderate  PLAN:  PT FREQUENCY: 1x/week  PT DURATION: 6 weeks  PLANNED INTERVENTIONS: Therapeutic exercises, Therapeutic activity, Neuromuscular re-education, Balance training, Gait training, Patient/Family education, Self Care, Joint mobilization, Stair training, Vestibular training, Canalith repositioning, Orthotic/Fit training, DME instructions, Aquatic Therapy, Dry Needling, Electrical stimulation, Spinal mobilization, Cryotherapy, Moist heat, Taping, Ionotophoresis 4mg /ml Dexamethasone, and Manual therapy  PLAN FOR NEXT SESSION: HEP review, progress balance activities   8:46 AM, 08/23/22 M. Shary Decamp, PT, DPT Physical Therapist-  Office Number: [phone]

## 2022-08-30 ENCOUNTER — Ambulatory Visit: Payer: Medicare Other

## 2022-08-30 DIAGNOSIS — M6281 Muscle weakness (generalized): Secondary | ICD-10-CM | POA: Diagnosis not present

## 2022-08-30 DIAGNOSIS — R2681 Unsteadiness on feet: Secondary | ICD-10-CM

## 2022-08-30 DIAGNOSIS — R2689 Other abnormalities of gait and mobility: Secondary | ICD-10-CM

## 2022-08-30 DIAGNOSIS — Z9181 History of falling: Secondary | ICD-10-CM

## 2022-08-30 DIAGNOSIS — R293 Abnormal posture: Secondary | ICD-10-CM | POA: Diagnosis not present

## 2022-08-30 NOTE — Therapy (Signed)
OUTPATIENT PHYSICAL THERAPY NEURO TREATMENT   Patient Name: Cole Martinez MRN: [id number] DOB:10/17/1945, 77 y.o., male Today's Date: 08/30/2022   PCP: Marden Noble, MD REFERRING PROVIDER: Glean Salvo, NP    END OF SESSION:  PT End of Session - 08/30/22 0934     Visit Number 17    Number of Visits 18    Date for PT Re-Evaluation 09/06/22    Authorization Type Medicare A&B/ [place]    Progress Note Due on Visit 20    PT Start Time 0933    PT Stop Time 1015    PT Time Calculation (min) 42 min             Past Medical History:  Diagnosis Date   ADHD (attention deficit hyperactivity disorder)    B12 deficiency    BPH (benign prostatic hypertrophy)    Chronic lymphocytic leukemia (CLL), T-cell (HCC) DX 1996--  ONCOLOGIST-  DR Truett Perna   PT IS ASYMPTOMATIC--- LAST CBC W/ DIFF 06-24-2012 STABLE   Coronary artery disease CARDIOLOGIST- DR Verdis Prime   S/P STENTING LAD 1999 // Myoview 01/2019: EF 62, normal perfusion; Low Risk   Crohn's disease of ileum (HCC) SINCE 1988   ED (erectile dysfunction)    Elevated PSA    Essential and other specified forms of tremor 11/25/2012   Gait abnormality 05/17/2020   H/O adenomatous polyp of colon    Hyperlipidemia    Hypertension    Nocturia    OA (osteoarthritis)    Peripheral neuropathy    hx of, none current as of 08-04-13   Peyronie disease    S/P coronary artery stent placement OCT 1999 OF LAD   Tremor, hereditary, benign MILD RIGHT HAND   Past Surgical History:  Procedure Laterality Date   CARDIOVASCULAR STRESS TEST  11-01-2010 DR Verdis Prime   NORMAL PERFUSION STUDY/ EF 64%/ NO ISCHEMIA   cataract surgery  Bilateral feburary 2020   with lens placement    COLONOSCOPY WITH PROPOFOL N/A 09/24/2012   Procedure: COLONOSCOPY WITH PROPOFOL;  Surgeon: Charolett Bumpers, MD;  Location: [facility];  Service: Endoscopy;  Laterality: N/A;   CORONARY ANGIOPLASTY WITH STENT PLACEMENT  OCT 1999   STENT OF LAD   CORONARY STENT  INTERVENTION N/A 02/14/2019   Procedure: CORONARY STENT INTERVENTION;  Surgeon: Lyn Records, MD;  Location: [facility];  Service: Cardiovascular;  Laterality: N/A;   CYSTOSCOPY WITH URETHRAL DILATATION  03/12/2017   Procedure: CYSTOSCOPY WITH URETHRAL DILATATION;  Surgeon: Ollen Gross, MD;  Location: [facility];  Service: Orthopedics;;  [name], Resident Assisting   CYSTOSCOPY WITH URETHRAL DILATATION N/A 10/03/2018   Procedure: CYSTOSCOPY WITH URETHRAL BALLOON DILATATION WITH BILATERAL RETROGRADE PYELOGRAPHY;  Surgeon: Heloise Purpura, MD;  Location: [facility];  Service: Urology;  Laterality: N/A;   CYSTOSCOPY WITH URETHRAL DILATATION N/A 12/30/2020   Procedure: CYSTOSCOPY WITH BALLOON DILATATION OF URETHRAL STRICTURE;  Surgeon: Heloise Purpura, MD;  Location: [facility];  Service: Urology;  Laterality: N/A;   LAPAROSCOPIC INGUINAL HERNIA REPAIR Bilateral 01-10-2004   W/ MESH   LEFT HEART CATH AND CORONARY ANGIOGRAPHY N/A 02/13/2019   Procedure: LEFT HEART CATH AND CORONARY ANGIOGRAPHY;  Surgeon: Lyn Records, MD;  Location: [facility];  Service: Cardiovascular;  Laterality: N/A;   neck benign removed from neck  yrs ago   PROSTATE BIOPSY N/A 07/12/2012   Procedure: PROSTATE BIOPSY AND ULTRASOUND;  Surgeon: Kathi Ludwig, MD;  Location: [facility];  Service: Urology;  Laterality: N/A;   PROSTATE SURGERY  2002   tuna   REMOVAL LEFT NECK LYMPH NODE  1996   TONSILLECTOMY  CHILD   TOTAL KNEE ARTHROPLASTY Right 03/12/2017   Procedure: RIGHT TOTAL KNEE ARTHROPLASTY;  Surgeon: Ollen Gross, MD;  Location: [facility];  Service: Orthopedics;  Laterality: Right;  Adductor Block   TRANSURETHRAL RESECTION OF PROSTATE N/A 08/08/2013   Procedure: TRANSURETHRAL RESECTION OF THE PROSTATE WITH GYRUS INSTRUMENTS;  Surgeon: Kathi Ludwig, MD;  Location: [facility];  Service: Urology;  Laterality: N/A;   TRANSURETHRAL RESECTION OF PROSTATE     Patient Active Problem List   Diagnosis  Date Noted   Adult attention deficit disorder 06/04/2020   Chronic lymphocytic leukemia (HCC) 06/04/2020   Conductive hearing loss, bilateral 06/04/2020   Crohn's ileitis (HCC) 06/04/2020   Disorder of musculoskeletal system 06/04/2020   Erectile dysfunction 06/04/2020   Gastroesophageal reflux disease 06/04/2020   Gout 06/04/2020   History of adenomatous polyp of colon 06/04/2020   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   Hypothyroidism 06/04/2020   Increased frequency of urination 06/04/2020   Male hypogonadism 06/04/2020   Muscle pain 06/04/2020   Nocturia 06/04/2020   Obesity 06/04/2020   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   Osteopenia 06/04/2020   Peripheral neurogenic pain 06/04/2020   Pure hypercholesterolemia 06/04/2020   Recurrent falls 06/04/2020   Unspecified kyphosis, thoracic region 06/04/2020   Vitamin B12 deficiency 06/04/2020   Vitamin D deficiency 06/04/2020   Gait abnormality 05/17/2020   Ataxia 03/16/2020   Angina pectoris (HCC) 02/13/2019   Pseudophakia of both eyes 08/01/2018   History of total knee arthroplasty 03/29/2017   Stiffness of right knee 03/16/2017   OA (osteoarthritis) of knee 03/12/2017   Tremor, essential 12/03/2015   Essential hypertension 12/09/2014   Coronary artery disease involving native heart 12/08/2013   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   Hyperlipidemia 12/08/2013   Benign prostatic hypertrophy 08/08/2013   Essential and other specified forms of tremor 11/25/2012    ONSET DATE: tremor has been present for years  REFERRING DIAG: R26.89 (ICD-10-CM) - Balance problem G25.0 (ICD-10-CM) - Essential tremor  THERAPY DIAG:  Unsteadiness on feet  Other abnormalities of gait and mobility  Abnormal posture  Muscle weakness (generalized)  History of falling  Rationale for Evaluation and Treatment: Rehabilitation  SUBJECTIVE:                                                                                                                                                                                              SUBJECTIVE STATEMENT: Feeling good, balance is overall feeling good  Pt accompanied  by: self  PERTINENT HISTORY: underlying complex medical history of coronary artery disease with status post stenting, hypertension, hyperlipidemia, elevated PSA, urethral stricture, Crohn's disease, CLL, ADHD, vitamin B12 deficiency, , R TKR, osteoarthritis, neuropathy, and tremor,  PAIN:  Are you having pain? Yes, left lateral hip 1-2/10  PRECAUTIONS: Fall  WEIGHT BEARING RESTRICTIONS: No  FALLS: Has patient fallen in last 6 months? No, reports fall 9 months ago, but fearful of falling  LIVING ENVIRONMENT: Lives with: lives with their spouse Lives in: House/apartment Stairs: Yes: Internal: flight steps; on right going up and External: yes steps; can reach both Has following equipment at home: None  PLOF: Independent  PATIENT GOALS: improve balance, upper body strength. Not willing to use AD  OBJECTIVE:    TODAY'S TREATMENT: 08/30/22 Activity Comments  Alt lunge on Bosu x 60 sec   Standing with foot on Bosu   Heel-toe rocking on pool noodle   Rocker board Ant-post/lateral  Standing on steep slope X30 sec EO/EC  Alt coordination -stair taps, step-ups and opposite reach  Single leg stance trials Cues for left hip abduction  Fukuda step test x 2 Steps to right 45+ deg       PATIENT EDUCATION: Education details: techniques for assessing hip drop Person educated: Patient Education method: Programmer, multimedia, Facilities manager, and Handouts Education comprehension: verbalized understanding, returned demonstration, and needs further education  HOME EXERCISE PROGRAM: Access Code: [email or web address] URL: [email or web address] Date: 04/10/2022 Prepared by: Shary Decamp  Exercises - Standing Balance in Corner  - 1 x daily - 7 x weekly - 3 sets - 30 sec hold - Standing Balance in Corner  with Eyes Closed  - 1 x daily - 7 x weekly - 3 sets - 30 sec hold - Corner Balance Feet Together: Eyes Open With Head Turns  - 1 x daily - 7 x weekly - 3 sets - 3-5 reps - Corner Balance Feet Together: Eyes Closed With Head Turns  - 1 x daily - 7 x weekly - 3 sets - 3-5 reps - Seated Cervical Retraction  - 1 x daily - 7 x weekly - 3 sets - 10 reps - 3-5 sec hold - Standing Cervical Retraction  - 1 x daily - 7 x weekly - 3 sets - 10 reps - Supine Cervical Retraction with Towel  - 1 x daily - 7 x weekly - 1-3 sets - 10 reps - 3-5 sec hold - Lat Pull Down  - 1 x daily - 7 x weekly - 3 sets - 10 reps - Seated Row Cable Machine  - 1 x daily - 7 x weekly - 3 sets - 10 reps - Deadlift with Pelvic Contraction  - 1 x daily - 7 x weekly - 3 sets - 10 reps - Full Leg Press  - 1 x daily - 7 x weekly - 3 sets - 10 reps - Seated Chest Press with Dumbbells  - 1 x daily - 7 x weekly - 3 sets - 10 reps - Half Kneeling Diagonal Lift with Narrow Balance  - 1 x daily - 7 x weekly - 2-3 sets - 10 reps - Assisted Lunge with TRX  - 2-3 sets - 10 reps - Squat with TRX  - 1 x daily - 7 x weekly - 2-3 sets - 10 reps  VESTIBULAR ASSESSMENT   GENERAL OBSERVATION: wears progressive lens    SYMPTOM BEHAVIOR:   Subjective history: poor balance   Non-Vestibular symptoms: changes in hearing   Type of  dizziness: Unsteady with head/body turns   Frequency: varies   Duration: varies   Aggravating factors:    Relieving factors: slow movements   Progression of symptoms: insidious   OCULOMOTOR EXAM:   Ocular Alignment: normal    Ocular ROM: No Limitations    Spontaneous Nystagmus: absent    Gaze-Induced Nystagmus: absent    Smooth Pursuits: saccades, more towards right tracking    Saccades: overshoots left> right     Convergence/Divergence: 8 cm    Head Impulse Test: difficult to assess due to neck tension, possibly +HIT to the left>right      VESTIBULAR - OCULAR REFLEX:    Slow VOR: Normal   VOR  Cancellation: Normal      Dynamic Visual Acuity: Not able to be assessed      POSITIONAL TESTING: NT due to no c/o positional vertigo         DIAGNOSTIC FINDINGS:   COGNITION: Overall cognitive status: Within functional limits for tasks assessed   SENSATION: Not tested  COORDINATION:   EDEMA:    MUSCLE TONE: resting and action tremor noted, UE > LE   MUSCLE LENGTH: NT  DTRs:    POSTURE: forward head  LOWER EXTREMITY ROM:     Active  Right Eval Left Eval  Hip flexion    Hip extension    Hip abduction    Hip adduction    Hip internal rotation    Hip external rotation    Knee flexion    Knee extension    Ankle dorsiflexion 10 10  Ankle plantarflexion    Ankle inversion    Ankle eversion     (Blank rows = not tested)  LOWER EXTREMITY MMT:    MMT Right Eval Left Eval  Hip flexion 5 4  Hip extension    Hip abduction 5 4  Hip adduction    Hip internal rotation    Hip external rotation    Knee flexion 5 5  Knee extension 5 4  Ankle dorsiflexion 5 5  Ankle plantarflexion    Ankle inversion    Ankle eversion    (Blank rows = not tested)  BED MOBILITY:  NT  TRANSFERS: Assistive device utilized: None  Sit to stand: Complete Independence Stand to sit: Complete Independence Chair to chair: Complete Independence Floor:  NT  RAMP:  NT  CURB:  NT  STAIRS: Level of Assistance: Modified independence Stair Negotiation Technique: Alternating Pattern  with Single Rail on Right Single Rail on Left Number of Stairs: 15  Height of Stairs: 4-6  Comments:   GAIT: Gait pattern:  unsteady  tremoring presnet Distance walked:  Assistive device utilized: None Level of assistance: Complete Independence and Modified independence Comments: reduced velocity  FUNCTIONAL TESTS:  Timed up and go (TUG): NT 10 meter walk test: 2.66 ft/sec Berg Balance Scale: NT Functional gait assessment: 16/30  M-CTSIB  Condition 1: Firm Surface, EO 30 Sec,  Normal and Mild Sway  Condition 2: Firm Surface, EC 10 Sec, Severe Sway  Condition 3: Foam Surface, EO 20 Sec, Moderate and Severe Sway  Condition 4: Foam Surface, EC 8 Sec, Severe Sway    PATIENT SURVEYS:        GOALS: Goals reviewed with patient? Yes  SHORT TERM GOALS: Target date: 08/09/2022     Patient will be independent in HEP to improve functional outcomes Baseline: Goal status: MET  2.  Demo improved balance and safety with activities such as standing/showering maintaining normal-mild sway per 30  sec condition 2 M-CTSIB Baseline: 10 sec, severe sway/retro LOB Goal status: MET  3.  Demo improved balance and safety with activities such as standing/showering maintaining normal-mild sway per 30 sec condition 4 M-CTSIB  Baseline: UNABLE  Goal status: IN PROGRESS  LONG TERM GOALS: Target date: 09/06/2022        Demo independence with advanced HEP that could include, but not limited to: strength, balance, coordination, posture, and ambulation practices Baseline:  Goal status: IN PROGRESS  2.  Demo 5/5 LLE strength to improve single limb support for improved gait stability Baseline: 4/5 LLE; (05/17/22) 4+/5 Goal status: IN PROGRESS  3.  Demo improved balance and reduced risk for falls per score 25/30 Functional Gait Assessment Baseline: 16/30; (05/17/22) 20/30; 18/30 (07/12/22) Goal status: IN PROGRESS   4. Demo improved balance and postural control per score 22/28 Mini-BESTest  Baseline: 15/28  Goal status: INITIAL    ASSESSMENT:  CLINICAL IMPRESSION: Training in technique sto improve single leg stability, balance, and hip engagement to prevent hip drop, scissoring steps. Training in alternating coordination and dynamic balance to facilitate righting reactions. Multi-sensory balance demands to improve safety with mobility and ADL.  Difficulty with eyes closed conditions and compliant/uneven surfaces.  Improved single leg stance stablity with visual feedback and  providing tactile cue sto self for hip approximation  OBJECTIVE IMPAIRMENTS: Abnormal gait, decreased activity tolerance, decreased balance, decreased coordination, decreased knowledge of use of DME, difficulty walking, decreased strength, impaired perceived functional ability, and postural dysfunction.   ACTIVITY LIMITATIONS: carrying, lifting, squatting, stairs, transfers, and locomotion level  PARTICIPATION LIMITATIONS: interpersonal relationship, community activity, and yard work  PERSONAL FACTORS: Age, Time since onset of injury/illness/exacerbation, and 1-2 comorbidities: essential tremor, Right TKR  are also affecting patient's functional outcome.   REHAB POTENTIAL: Good  CLINICAL DECISION MAKING: Evolving/moderate complexity  EVALUATION COMPLEXITY: Moderate  PLAN:  PT FREQUENCY: 1x/week  PT DURATION: 6 weeks  PLANNED INTERVENTIONS: Therapeutic exercises, Therapeutic activity, Neuromuscular re-education, Balance training, Gait training, Patient/Family education, Self Care, Joint mobilization, Stair training, Vestibular training, Canalith repositioning, Orthotic/Fit training, DME instructions, Aquatic Therapy, Dry Needling, Electrical stimulation, Spinal mobilization, Cryotherapy, Moist heat, Taping, Ionotophoresis 4mg /ml Dexamethasone, and Manual therapy  PLAN FOR NEXT SESSION: HEP review, progress balance activities   9:35 AM, 08/30/22 M. Shary Decamp, PT, DPT Physical Therapist- [place] Office Number: [phone]

## 2022-08-31 DIAGNOSIS — Z79899 Other long term (current) drug therapy: Secondary | ICD-10-CM | POA: Diagnosis not present

## 2022-08-31 DIAGNOSIS — H6123 Impacted cerumen, bilateral: Secondary | ICD-10-CM | POA: Diagnosis not present

## 2022-08-31 DIAGNOSIS — Z7982 Long term (current) use of aspirin: Secondary | ICD-10-CM | POA: Diagnosis not present

## 2022-08-31 DIAGNOSIS — Z9181 History of falling: Secondary | ICD-10-CM | POA: Diagnosis not present

## 2022-08-31 DIAGNOSIS — R413 Other amnesia: Secondary | ICD-10-CM | POA: Diagnosis not present

## 2022-08-31 DIAGNOSIS — I6782 Cerebral ischemia: Secondary | ICD-10-CM | POA: Diagnosis not present

## 2022-08-31 DIAGNOSIS — G250 Essential tremor: Secondary | ICD-10-CM | POA: Diagnosis not present

## 2022-08-31 DIAGNOSIS — N39490 Overflow incontinence: Secondary | ICD-10-CM | POA: Diagnosis not present

## 2022-08-31 DIAGNOSIS — R270 Ataxia, unspecified: Secondary | ICD-10-CM | POA: Diagnosis not present

## 2022-08-31 DIAGNOSIS — H903 Sensorineural hearing loss, bilateral: Secondary | ICD-10-CM | POA: Diagnosis not present

## 2022-08-31 DIAGNOSIS — R296 Repeated falls: Secondary | ICD-10-CM | POA: Diagnosis not present

## 2022-09-04 DIAGNOSIS — Z79899 Other long term (current) drug therapy: Secondary | ICD-10-CM | POA: Diagnosis not present

## 2022-09-04 DIAGNOSIS — R42 Dizziness and giddiness: Secondary | ICD-10-CM | POA: Diagnosis not present

## 2022-09-06 ENCOUNTER — Ambulatory Visit: Payer: Medicare Other | Attending: Neurology

## 2022-09-06 DIAGNOSIS — R2681 Unsteadiness on feet: Secondary | ICD-10-CM | POA: Insufficient documentation

## 2022-09-06 DIAGNOSIS — R2689 Other abnormalities of gait and mobility: Secondary | ICD-10-CM | POA: Insufficient documentation

## 2022-09-06 DIAGNOSIS — M6281 Muscle weakness (generalized): Secondary | ICD-10-CM | POA: Diagnosis not present

## 2022-09-06 DIAGNOSIS — R293 Abnormal posture: Secondary | ICD-10-CM | POA: Diagnosis not present

## 2022-09-06 NOTE — Therapy (Signed)
OUTPATIENT PHYSICAL THERAPY NEURO TREATMENT and D/C Summary   Patient Name: Cole Martinez MRN: [id number] DOB:07-31-1945, 77 y.o., male Today's Date: 09/06/2022   PCP: Marden Noble, MD REFERRING PROVIDER: Glean Salvo, NP  PHYSICAL THERAPY DISCHARGE SUMMARY  Visits from Start of Care: 18  Current functional level related to goals / functional outcomes: Progressed with POC details and demo improved scores on functional outcome measures: see below for details   Remaining deficits: High risk for falls, ataxia, balance disorder   Education / Equipment: HEP   Patient agrees to discharge. Patient goals were partially met. Patient is being discharged due to being pleased with the current functional level.    END OF SESSION:  PT End of Session - 09/06/22 1617     Visit Number 18    Number of Visits 18    Date for PT Re-Evaluation 09/06/22    Authorization Type Medicare A&B/ [place]    Progress Note Due on Visit 20    PT Start Time 1615    PT Stop Time 1700    PT Time Calculation (min) 45 min             Past Medical History:  Diagnosis Date   ADHD (attention deficit hyperactivity disorder)    B12 deficiency    BPH (benign prostatic hypertrophy)    Chronic lymphocytic leukemia (CLL), T-cell (HCC) DX 1996--  ONCOLOGIST-  DR Truett Perna   PT IS ASYMPTOMATIC--- LAST CBC W/ DIFF 06-24-2012 STABLE   Coronary artery disease CARDIOLOGIST- DR Verdis Prime   S/P STENTING LAD 1999 // Myoview 01/2019: EF 62, normal perfusion; Low Risk   Crohn's disease of ileum (HCC) SINCE 1988   ED (erectile dysfunction)    Elevated PSA    Essential and other specified forms of tremor 11/25/2012   Gait abnormality 05/17/2020   H/O adenomatous polyp of colon    Hyperlipidemia    Hypertension    Nocturia    OA (osteoarthritis)    Peripheral neuropathy    hx of, none current as of 08-04-13   Peyronie disease    S/P coronary artery stent placement OCT 1999 OF LAD   Tremor, hereditary, benign  MILD RIGHT HAND   Past Surgical History:  Procedure Laterality Date   CARDIOVASCULAR STRESS TEST  11-01-2010 DR Verdis Prime   NORMAL PERFUSION STUDY/ EF 64%/ NO ISCHEMIA   cataract surgery  Bilateral feburary 2020   with lens placement    COLONOSCOPY WITH PROPOFOL N/A 09/24/2012   Procedure: COLONOSCOPY WITH PROPOFOL;  Surgeon: Charolett Bumpers, MD;  Location: [facility];  Service: Endoscopy;  Laterality: N/A;   CORONARY ANGIOPLASTY WITH STENT PLACEMENT  OCT 1999   STENT OF LAD   CORONARY STENT INTERVENTION N/A 02/14/2019   Procedure: CORONARY STENT INTERVENTION;  Surgeon: Lyn Records, MD;  Location: [facility];  Service: Cardiovascular;  Laterality: N/A;   CYSTOSCOPY WITH URETHRAL DILATATION  03/12/2017   Procedure: CYSTOSCOPY WITH URETHRAL DILATATION;  Surgeon: Ollen Gross, MD;  Location: [facility];  Service: Orthopedics;;  [name], Resident Assisting   CYSTOSCOPY WITH URETHRAL DILATATION N/A 10/03/2018   Procedure: CYSTOSCOPY WITH URETHRAL BALLOON DILATATION WITH BILATERAL RETROGRADE PYELOGRAPHY;  Surgeon: Heloise Purpura, MD;  Location: [facility];  Service: Urology;  Laterality: N/A;   CYSTOSCOPY WITH URETHRAL DILATATION N/A 12/30/2020   Procedure: CYSTOSCOPY WITH BALLOON DILATATION OF URETHRAL STRICTURE;  Surgeon: Heloise Purpura, MD;  Location: [facility];  Service: Urology;  Laterality: N/A;   LAPAROSCOPIC INGUINAL HERNIA  REPAIR Bilateral 01-10-2004   W/ MESH   LEFT HEART CATH AND CORONARY ANGIOGRAPHY N/A 02/13/2019   Procedure: LEFT HEART CATH AND CORONARY ANGIOGRAPHY;  Surgeon: Lyn Records, MD;  Location: [facility];  Service: Cardiovascular;  Laterality: N/A;   neck benign removed from neck  yrs ago   PROSTATE BIOPSY N/A 07/12/2012   Procedure: PROSTATE BIOPSY AND ULTRASOUND;  Surgeon: Kathi Ludwig, MD;  Location: [facility];  Service: Urology;  Laterality: N/A;   PROSTATE SURGERY  2002   tuna   REMOVAL LEFT NECK LYMPH NODE  1996    TONSILLECTOMY  CHILD   TOTAL KNEE ARTHROPLASTY Right 03/12/2017   Procedure: RIGHT TOTAL KNEE ARTHROPLASTY;  Surgeon: Ollen Gross, MD;  Location: [facility];  Service: Orthopedics;  Laterality: Right;  Adductor Block   TRANSURETHRAL RESECTION OF PROSTATE N/A 08/08/2013   Procedure: TRANSURETHRAL RESECTION OF THE PROSTATE WITH GYRUS INSTRUMENTS;  Surgeon: Kathi Ludwig, MD;  Location: [facility];  Service: Urology;  Laterality: N/A;   TRANSURETHRAL RESECTION OF PROSTATE     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   Hypothyroidism 06/04/2020   Increased frequency of urination 06/04/2020   Male hypogonadism 06/04/2020   Muscle pain 06/04/2020   Nocturia 06/04/2020   Obesity 06/04/2020   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   Osteopenia 06/04/2020   Peripheral neurogenic pain 06/04/2020   Pure hypercholesterolemia 06/04/2020   Recurrent falls 06/04/2020   Unspecified kyphosis, thoracic region 06/04/2020   Vitamin B12 deficiency 06/04/2020   Vitamin D deficiency 06/04/2020   Gait abnormality 05/17/2020   Ataxia 03/16/2020   Angina pectoris (HCC) 02/13/2019   Pseudophakia of both eyes 08/01/2018   History of total knee arthroplasty 03/29/2017   Stiffness of right knee 03/16/2017   OA (osteoarthritis) of knee 03/12/2017   Tremor, essential 12/03/2015   Essential hypertension 12/09/2014   Coronary artery disease involving native heart 12/08/2013   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   Hyperlipidemia 12/08/2013   Benign prostatic hypertrophy  08/08/2013   Essential and other specified forms of tremor 11/25/2012    ONSET DATE: tremor has been present for years  REFERRING DIAG: R26.89 (ICD-10-CM) - Balance problem G25.0 (ICD-10-CM) - Essential tremor  THERAPY DIAG:  Unsteadiness on feet  Other abnormalities of gait and mobility  Abnormal posture  Muscle weakness (generalized)  Rationale for Evaluation and Treatment: Rehabilitation  SUBJECTIVE:  SUBJECTIVE STATEMENT: Had vestibular assessment at [facility] and concluded the inner ear is not the cause of balance disorder and will be undergoing brain MRI  Pt accompanied by: self  PERTINENT HISTORY: underlying complex medical history of coronary artery disease with status post stenting, hypertension, hyperlipidemia, elevated PSA, urethral stricture, Crohn's disease, CLL, ADHD, vitamin B12 deficiency, , R TKR, osteoarthritis, neuropathy, and tremor,  PAIN:  Are you having pain? Yes, left lateral hip 1-2/10  PRECAUTIONS: Fall  WEIGHT BEARING RESTRICTIONS: No  FALLS: Has patient fallen in last 6 months? No, reports fall 9 months ago, but fearful of falling  LIVING ENVIRONMENT: Lives with: lives with their spouse Lives in: House/apartment Stairs: Yes: Internal: flight steps; on right going up and External: yes steps; can reach both Has following equipment at home: None  PLOF: Independent  PATIENT GOALS: improve balance, upper body strength. Not willing to use AD  OBJECTIVE:   TODAY'S TREATMENT: 09/06/22 Activity Comments  POC details review See STG/LTG                    TODAY'S TREATMENT: 08/30/22 Activity Comments  Alt lunge on Bosu x 60 sec   Standing with foot on Bosu   Heel-toe rocking on pool noodle   Rocker board Ant-post/lateral  Standing on steep slope X30  sec EO/EC  Alt coordination -stair taps, step-ups and opposite reach  Single leg stance trials Cues for left hip abduction  Fukuda step test x 2 Steps to right 45+ deg       PATIENT EDUCATION: Education details: techniques for assessing hip drop Person educated: Patient Education method: Programmer, multimedia, Facilities manager, and Handouts Education comprehension: verbalized understanding, returned demonstration, and needs further education  HOME EXERCISE PROGRAM: Access Code: [email or web address] URL: [email or web address] Date: 04/10/2022 Prepared by: Shary Decamp  Exercises - Standing Balance in Corner  - 1 x daily - 7 x weekly - 3 sets - 30 sec hold - Standing Balance in Corner with Eyes Closed  - 1 x daily - 7 x weekly - 3 sets - 30 sec hold - Corner Balance Feet Together: Eyes Open With Head Turns  - 1 x daily - 7 x weekly - 3 sets - 3-5 reps - Corner Balance Feet Together: Eyes Closed With Head Turns  - 1 x daily - 7 x weekly - 3 sets - 3-5 reps - Seated Cervical Retraction  - 1 x daily - 7 x weekly - 3 sets - 10 reps - 3-5 sec hold - Standing Cervical Retraction  - 1 x daily - 7 x weekly - 3 sets - 10 reps - Supine Cervical Retraction with Towel  - 1 x daily - 7 x weekly - 1-3 sets - 10 reps - 3-5 sec hold - Lat Pull Down  - 1 x daily - 7 x weekly - 3 sets - 10 reps - Seated Row Cable Machine  - 1 x daily - 7 x weekly - 3 sets - 10 reps - Deadlift with Pelvic Contraction  - 1 x daily - 7 x weekly - 3 sets - 10 reps - Full Leg Press  - 1 x daily - 7 x weekly - 3 sets - 10 reps - Seated Chest Press with Dumbbells  - 1 x daily - 7 x weekly - 3 sets - 10 reps - Half Kneeling Diagonal Lift with Narrow Balance  - 1 x daily - 7 x weekly - 2-3 sets - 10 reps - Assisted Lunge with TRX  -  2-3 sets - 10 reps - Squat with TRX  - 1 x daily - 7 x weekly - 2-3 sets - 10 reps  VESTIBULAR ASSESSMENT   GENERAL OBSERVATION: wears progressive lens    SYMPTOM BEHAVIOR:   Subjective history: poor  balance   Non-Vestibular symptoms: changes in hearing   Type of dizziness: Unsteady with head/body turns   Frequency: varies   Duration: varies   Aggravating factors:    Relieving factors: slow movements   Progression of symptoms: insidious   OCULOMOTOR EXAM:   Ocular Alignment: normal    Ocular ROM: No Limitations    Spontaneous Nystagmus: absent    Gaze-Induced Nystagmus: absent    Smooth Pursuits: saccades, more towards right tracking    Saccades: overshoots left> right     Convergence/Divergence: 8 cm    Head Impulse Test: difficult to assess due to neck tension, possibly +HIT to the left>right      VESTIBULAR - OCULAR REFLEX:    Slow VOR: Normal   VOR Cancellation: Normal      Dynamic Visual Acuity: Not able to be assessed      POSITIONAL TESTING: NT due to no c/o positional vertigo         DIAGNOSTIC FINDINGS:   COGNITION: Overall cognitive status: Within functional limits for tasks assessed   SENSATION: Not tested  COORDINATION:   EDEMA:    MUSCLE TONE: resting and action tremor noted, UE > LE   MUSCLE LENGTH: NT  DTRs:    POSTURE: forward head  LOWER EXTREMITY ROM:     Active  Right Eval Left Eval  Hip flexion    Hip extension    Hip abduction    Hip adduction    Hip internal rotation    Hip external rotation    Knee flexion    Knee extension    Ankle dorsiflexion 10 10  Ankle plantarflexion    Ankle inversion    Ankle eversion     (Blank rows = not tested)  LOWER EXTREMITY MMT:    MMT Right Eval Left Eval  Hip flexion 5 4  Hip extension    Hip abduction 5 4  Hip adduction    Hip internal rotation    Hip external rotation    Knee flexion 5 5  Knee extension 5 4  Ankle dorsiflexion 5 5  Ankle plantarflexion    Ankle inversion    Ankle eversion    (Blank rows = not tested)  BED MOBILITY:  NT  TRANSFERS: Assistive device utilized: None  Sit to stand: Complete Independence Stand to sit: Complete  Independence Chair to chair: Complete Independence Floor:  NT  RAMP:  NT  CURB:  NT  STAIRS: Level of Assistance: Modified independence Stair Negotiation Technique: Alternating Pattern  with Single Rail on Right Single Rail on Left Number of Stairs: 15  Height of Stairs: 4-6  Comments:   GAIT: Gait pattern:  unsteady  tremoring presnet Distance walked:  Assistive device utilized: None Level of assistance: Complete Independence and Modified independence Comments: reduced velocity  FUNCTIONAL TESTS:  Timed up and go (TUG): NT 10 meter walk test: 2.66 ft/sec Berg Balance Scale: NT Functional gait assessment: 16/30  M-CTSIB  Condition 1: Firm Surface, EO 30 Sec, Normal and Mild Sway  Condition 2: Firm Surface, EC 10 Sec, Severe Sway  Condition 3: Foam Surface, EO 20 Sec, Moderate and Severe Sway  Condition 4: Foam Surface, EC 8 Sec, Severe Sway    PATIENT SURVEYS:  GOALS: Goals reviewed with patient? Yes  SHORT TERM GOALS: Target date: 08/09/2022     Patient will be independent in HEP to improve functional outcomes Baseline: Goal status: MET  2.  Demo improved balance and safety with activities such as standing/showering maintaining normal-mild sway per 30 sec condition 2 M-CTSIB Baseline: 10 sec, severe sway/retro LOB Goal status: MET  3.  Demo improved balance and safety with activities such as standing/showering maintaining normal-mild sway per 30 sec condition 4 M-CTSIB  Baseline: mod-severe x 10 sec  Goal status: NOT MET  LONG TERM GOALS: Target date: 09/06/2022        Demo independence with advanced HEP that could include, but not limited to: strength, balance, coordination, posture, and ambulation practices Baseline:  Goal status: MET  2.  Demo 5/5 LLE strength to improve single limb support for improved gait stability Baseline: 4/5 LLE; (05/17/22) 4+/5; (09/05/22) 5/5 BLE Goal status: MET  3.  Demo improved balance and reduced risk for  falls per score 25/30 Functional Gait Assessment Baseline: 16/30; (05/17/22) 20/30; 18/30 (07/12/22); (09/05/22) 20/30 Goal status: NOT MET   4. Demo improved balance and postural control per score 22/28 Mini-BESTest  Baseline: 15/28; (09/05/22) 20/28  Goal status: NOT MET    ASSESSMENT:  CLINICAL IMPRESSION: Presents today for D/C assessment and summary with performance of functional outcome measures.  Overall, improved scores for Functional Gait Assessment and Mini-BESTest from initial outset.  Patient demo excellent compliance with HEP and gym-focused routine.  Improved BLE strength evident by assessment and manual muscle testing. Continues to be affected by ataxia in gait and LUE tremoring.  Will D/C to HEP at this time.  OBJECTIVE IMPAIRMENTS: Abnormal gait, decreased activity tolerance, decreased balance, decreased coordination, decreased knowledge of use of DME, difficulty walking, decreased strength, impaired perceived functional ability, and postural dysfunction.   ACTIVITY LIMITATIONS: carrying, lifting, squatting, stairs, transfers, and locomotion level  PARTICIPATION LIMITATIONS: interpersonal relationship, community activity, and yard work  PERSONAL FACTORS: Age, Time since onset of injury/illness/exacerbation, and 1-2 comorbidities: essential tremor, Right TKR  are also affecting patient's functional outcome.   REHAB POTENTIAL: Good  CLINICAL DECISION MAKING: Evolving/moderate complexity  EVALUATION COMPLEXITY: Moderate  PLAN:  PT FREQUENCY: 1x/week  PT DURATION: 6 weeks  PLANNED INTERVENTIONS: Therapeutic exercises, Therapeutic activity, Neuromuscular re-education, Balance training, Gait training, Patient/Family education, Self Care, Joint mobilization, Stair training, Vestibular training, Canalith repositioning, Orthotic/Fit training, DME instructions, Aquatic Therapy, Dry Needling, Electrical stimulation, Spinal mobilization, Cryotherapy, Moist heat, Taping,  Ionotophoresis 4mg /ml Dexamethasone, and Manual therapy  PLAN FOR NEXT SESSION: D/C to HEP   4:17 PM, 09/06/22 M. Shary Decamp, PT, DPT Physical Therapist- [place] Office Number: [phone]

## 2022-09-14 ENCOUNTER — Encounter: Payer: Self-pay | Admitting: Internal Medicine

## 2022-09-14 ENCOUNTER — Ambulatory Visit: Payer: Medicare Other | Attending: Internal Medicine

## 2022-09-14 DIAGNOSIS — E7800 Pure hypercholesterolemia, unspecified: Secondary | ICD-10-CM

## 2022-09-14 DIAGNOSIS — I25119 Atherosclerotic heart disease of native coronary artery with unspecified angina pectoris: Secondary | ICD-10-CM

## 2022-09-14 LAB — HEPATIC FUNCTION PANEL
ALT: 17 IU/L (ref 0–44)
AST: 23 IU/L (ref 0–40)
Albumin: 4.3 g/dL (ref 3.8–4.8)
Alkaline Phosphatase: 76 IU/L (ref 44–121)
Bilirubin Total: 0.7 mg/dL (ref 0.0–1.2)
Bilirubin, Direct: 0.2 mg/dL (ref 0.00–0.40)
Total Protein: 5.9 g/dL — ABNORMAL LOW (ref 6.0–8.5)

## 2022-09-14 LAB — LIPID PANEL
Chol/HDL Ratio: 2.3 ratio (ref 0.0–5.0)
Cholesterol, Total: 111 mg/dL (ref 100–199)
HDL: 48 mg/dL (ref >39)
LDL Chol Calc (NIH): 43 mg/dL (ref 0–99)
Triglycerides: 109 mg/dL (ref 0–149)
VLDL Cholesterol Cal: 20 mg/dL (ref 5–40)

## 2022-09-26 DIAGNOSIS — G9389 Other specified disorders of brain: Secondary | ICD-10-CM | POA: Diagnosis not present

## 2022-09-26 DIAGNOSIS — R32 Unspecified urinary incontinence: Secondary | ICD-10-CM | POA: Diagnosis not present

## 2022-09-26 DIAGNOSIS — R269 Unspecified abnormalities of gait and mobility: Secondary | ICD-10-CM | POA: Diagnosis not present

## 2022-09-26 DIAGNOSIS — R270 Ataxia, unspecified: Secondary | ICD-10-CM | POA: Diagnosis not present

## 2022-09-26 DIAGNOSIS — G250 Essential tremor: Secondary | ICD-10-CM | POA: Diagnosis not present

## 2022-09-26 DIAGNOSIS — R9089 Other abnormal findings on diagnostic imaging of central nervous system: Secondary | ICD-10-CM | POA: Diagnosis not present

## 2022-09-26 DIAGNOSIS — H903 Sensorineural hearing loss, bilateral: Secondary | ICD-10-CM | POA: Diagnosis not present

## 2022-09-26 DIAGNOSIS — R4182 Altered mental status, unspecified: Secondary | ICD-10-CM | POA: Diagnosis not present

## 2022-09-28 DIAGNOSIS — R270 Ataxia, unspecified: Secondary | ICD-10-CM | POA: Diagnosis not present

## 2022-09-28 DIAGNOSIS — G250 Essential tremor: Secondary | ICD-10-CM | POA: Diagnosis not present

## 2022-09-28 DIAGNOSIS — N39490 Overflow incontinence: Secondary | ICD-10-CM | POA: Diagnosis not present

## 2022-09-28 DIAGNOSIS — R296 Repeated falls: Secondary | ICD-10-CM | POA: Diagnosis not present

## 2022-09-28 DIAGNOSIS — H903 Sensorineural hearing loss, bilateral: Secondary | ICD-10-CM | POA: Diagnosis not present

## 2022-09-28 DIAGNOSIS — R2689 Other abnormalities of gait and mobility: Secondary | ICD-10-CM | POA: Diagnosis not present

## 2022-10-03 ENCOUNTER — Telehealth: Payer: Self-pay | Admitting: Neurology

## 2022-10-03 NOTE — Telephone Encounter (Signed)
At 2:41 Dr Dorma Russell (with Head and neck surgery division of Neurology with [facility] in [facility]) he reports that pt is having difficulty with gate, tremors, memory loss and incontinence .  Dr Dorma Russell stated they did a full head and neck follow up.  He wants to provide an update on what he found with either Dr Frances Furbish or Maralyn Sago, NP.  He left his cell # on vm it is [phone]

## 2022-10-03 NOTE — Telephone Encounter (Signed)
I called Dr. Dorma Russell.  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  Recommended consideration of LP to see if any change to symptoms.  He is exhibiting magnetic gait, has urinary incontinence, decline in memory.  They also did extensive vestibular testing.  Had MRI of the brain showing some changes in the cerebellum.  MRI not completely consistent with NPH.  Mention of Fragile X ataxia.  Has had several falls, worsening tremor.  He was fitted for hearing aids.   Can we get this patient seen by Dr. Frances Furbish in the next 2-3 weeks for evaluation.    MRI brain 09/26/22 1.  No acute intracranial abnormality.  2.  No retrocochlear lesion identified.  3.  Enlargement of the ventricles is favored to relate to moderate generalized parenchymal volume loss without specific morphologic findings to suggest normal pressure hydrocephalus.  4.  Abnormal nonenhancing T2 hyperintense signal involving the left greater than right middle cerebellar peduncles. Although this finding may relate to a prior nonspecific insult, this appearance can be seen with fragile X-associated tremor/ataxia syndrome (FXTAS).  5.  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 No corresponding mass effect.

## 2022-10-04 ENCOUNTER — Telehealth: Payer: Self-pay | Admitting: Neurology

## 2022-10-04 NOTE — Telephone Encounter (Signed)
Pt called needing to speak to the NP regarding his balance issues. He states that he saw another provider and they mentioned that it could possibly be fluid in the brain that is causing his balance issues. Pt would like to discuss before scheduled appt.

## 2022-10-04 NOTE — Telephone Encounter (Signed)
Patient is wanting it known he is seeing Dr. Dorma Russell who told him he may have fluid on his brian causing the balance issues, He is still complaining of the balance problems and nothing is seeming to help them. Dr. Dorma Russell also told him this is done through a spinal tap of some sort so now he wants to Dr. Frances Furbish to see the notes form Dr. Dorma Russell as well as Maralyn Sago and he is aware of is follow up appointment and is okay with waiting until then for answers but does want both providers aware and giving input, I will forward to both providers

## 2022-10-05 NOTE — Telephone Encounter (Signed)
Opening came available w/ Dr Frances Furbish on 8/8. Patient cannot come as [place] beach from [place]10. He is also not available on 8/20 or 8/21 due to his wife having surgery. I advised we'll be on the lookout for another cancellation. He was appreciative.

## 2022-10-09 NOTE — Telephone Encounter (Signed)
Called and spoke to pt and stated that Cole Martinez would like him to see Dr. Frances Furbish within the next few weeks and that someone will be in touch asap to get them scheduled

## 2022-10-09 NOTE — Telephone Encounter (Signed)
Noted  

## 2022-10-09 NOTE — Telephone Encounter (Addendum)
The soonest I can get him in with Dr Frances Furbish is on 9/4 at 7:45 AM. Patient accepted this appt. I canceled his appt with Maralyn Sago since he needs to see Dr Frances Furbish instead. He was in agreement. I placed him on the wait list to see Dr Frances Furbish.  FYI there are two phone notes circulating about this same problem.

## 2022-10-09 NOTE — Telephone Encounter (Signed)
Please let him know that I spoke with Dr. Dorma Russell and that we would like to get him in to see Dr. Frances Furbish for re-evaluation in the next few weeks. Looks like Toma Copier was working to find a time that was conducive for his schedule. Thanks

## 2022-10-09 NOTE — Telephone Encounter (Addendum)
I spoke with the patient. He will see Dr Frances Furbish at our next available on 11/08/22 at 7:45 AM arrival 7:15. He was placed on the wait list for a sooner appt.

## 2022-10-10 NOTE — Telephone Encounter (Signed)
Per Elon Jester, pt called back. He cannot come on 8/12. He will keep the 9/4 appt.

## 2022-10-10 NOTE — Telephone Encounter (Addendum)
I called pt to offer him sooner appt with Dr Frances Furbish on Monday 10/16/22 at 2:15 arrival 1:45 pm. He is going to talk to his wife and then call back in a few minutes. When he calls back please schedule him either on 8/12 if he can come (I have it held for now) or have him clarify if he is going to come on 9/3 or 9/4. Somehow he has two appts scheduled but he only needs one.

## 2022-10-17 ENCOUNTER — Ambulatory Visit: Payer: Medicare Other | Admitting: Podiatry

## 2022-10-26 ENCOUNTER — Ambulatory Visit: Payer: Medicare Other | Admitting: Neurology

## 2022-11-07 ENCOUNTER — Encounter: Payer: Self-pay | Admitting: Neurology

## 2022-11-07 ENCOUNTER — Ambulatory Visit: Payer: Self-pay | Admitting: Neurology

## 2022-11-08 ENCOUNTER — Ambulatory Visit (INDEPENDENT_AMBULATORY_CARE_PROVIDER_SITE_OTHER): Payer: Medicare Other | Admitting: Neurology

## 2022-11-08 ENCOUNTER — Telehealth: Payer: Self-pay | Admitting: Neurology

## 2022-11-08 ENCOUNTER — Encounter: Payer: Self-pay | Admitting: Neurology

## 2022-11-08 VITALS — BP 159/70 | HR 54 | Ht 67.0 in | Wt 176.0 lb

## 2022-11-08 DIAGNOSIS — Z9189 Other specified personal risk factors, not elsewhere classified: Secondary | ICD-10-CM | POA: Diagnosis not present

## 2022-11-08 DIAGNOSIS — R2689 Other abnormalities of gait and mobility: Secondary | ICD-10-CM | POA: Diagnosis not present

## 2022-11-08 DIAGNOSIS — G9389 Other specified disorders of brain: Secondary | ICD-10-CM

## 2022-11-08 DIAGNOSIS — Z789 Other specified health status: Secondary | ICD-10-CM | POA: Diagnosis not present

## 2022-11-08 DIAGNOSIS — R296 Repeated falls: Secondary | ICD-10-CM

## 2022-11-08 DIAGNOSIS — G250 Essential tremor: Secondary | ICD-10-CM

## 2022-11-08 NOTE — Telephone Encounter (Signed)
medicare no auth required sent to [facility] nuclear medicine department. [phone]

## 2022-11-08 NOTE — Progress Notes (Signed)
Subjective:    Patient ID: Cole Martinez is a 77 y.o. male.  HPI    Interim history:   Cole Martinez is a 77 year old right-handed gentleman with an underlying complex medical history of coronary artery disease, s/p stent placement, hypertension, hyperlipidemia, elevated PSA, urethral stricture, Crohn's disease, CLL, ADHD, vitamin B12 deficiency, osteoarthritis, neuropathy, gait d/o, and tremor, who presents for follow-up consultation of his tremor and gait d/o, wanting to talk about NPH.  The patient is accompanied by his wife today. I last saw him on 09/20/2021, at which time he reported a recent fall.  He had fallen while vacationing.  He felt that his balance was not as good.  He was consuming 1 alcoholic beverage per day.  For his tremor, I suggested a cautious increase in his Mysoline to 75 mg at bedtime but also cautioned him that it may cause balance issues and sleepiness.  He continues to take low-dose metoprolol per cardiology.  He was encouraged to reduce his alcohol consumption and encouraged to use a cane for gait safety.  He was reluctant to consider a cane.  Today, 11/08/2022: He reports that his gait and balance have been worse, his wife reports that he has had a fairly drastic change within the past several months to up to a year in his ability to walk, his balance, and has had at least 3 falls in the past 6 months.  He has been using a cane, he does have a 2 wheeled walker at the house but has not used it consistently.  He admits that he does not hydrate very well, reports that he has had an overactive bladder.  He has had occasional urinary incontinence.  His memory has not been as good.  He is worried about having NPH.  He has heard about focused ultrasound treatment for tremor, he would like to consider this if possible.  He is into mind about doing a lumbar puncture for further evaluation of NPH.  They have done some reading and also talked with their kids.  He drinks alcohol daily,  liquor, in the evening.  He usually has 1 drink per day.  He has been taking Mysoline 75 mg daily in the evening.  He feels that his tremor has become worse, it flares up easily when he is nervous.  He has difficulty carrying a drink or holding utensils.  He had recently seen Dr. Jac Canavan with ENT and the possibility of NPH was discussed.  He was working on getting hearing aids.  He was advised to continue using his cane.  He has seen urology but not within the past 6 months.  He previously tried Flomax but is no longer on it.  He has not tried any other bladder medications.  I reviewed his recent head CT scan through the PACS system and while he does have mild ventriculomegaly, it does not stand out of proportion to the generalized atrophy.  Of note, he had a head CT without contrast, maxillofacial CT without contrast and cervical spine CT without contrast through the emergency room on 06/21/2022 after fall.  I reviewed the results: IMPRESSION: 1. No acute intracranial abnormality. 2. No acute displaced facial fracture. 3. No acute displaced fracture or traumatic listhesis of the cervical spine. 4. Grade 1 anterolisthesis of C3 on C4 and C4 on C5 and associated multilevel degenerative changes spine leading to C3-C4 and C4-C5 severe osseous neural foraminal stenosis bilaterally.   He had a brain MRI with and without contrast  through [facility] on 09/26/2022 and I have reviewed the results: Impression:1.  No acute intracranial abnormality.  2.  No retrocochlear lesion identified.  3.  Enlargement of the ventricles is favored to relate to moderate generalized parenchymal volume loss without specific morphologic findings to suggest normal pressure hydrocephalus.  4.  Abnormal nonenhancing T2 hyperintense signal involving the left greater than right middle cerebellar peduncles. Although this finding may relate to a prior nonspecific insult, this appearance can be seen with fragile X-associated  tremor/ataxia syndrome (FXTAS).  5.  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 No corresponding mass effect.    The patient's allergies, current medications, family history, past medical history, past social history, past surgical history and problem list were reviewed and updated as appropriate.    Previously:   I first met him on 03/29/2021, at which time he felt fairly stable with regards to his tremor, he had noticed more balance issues.  He was taking Mysoline low-dose 50 mg at bedtime.  We talked about the possibility of increasing the Mysoline slightly to 75 mg at bedtime, he was advised to hydrate well with water and limit his caffeine to 1-2 servings per day and discuss with cardiology the possibility of increasing his metoprolol.  He did have mild bradycardia.  He was advised to start using a cane for gait safety.     03/29/21 (SA): He previously followed with Dr. Stephanie Acre and was last seen by him on 10/25/2020.  I reviewed the note and copied the note below for reference. The patient reported problems with his memory at the time.  His examination was benign in that regard.  He was advised to continue with Mysoline 50 mg at bedtime and the possibility of pursuing a DaTscan was discussed.   The patient had a brain MRI without contrast on 09/24/2020 and I reviewed the results:    IMPRESSION: This MRI of the brain without contrast shows the following:  1.   Generalized cortical atrophy, stable compared to the CT scan from 03/20/2017. 2.    Some T2/flair hyperintense foci in the hemispheres consistent with mild chronic microvascular ischemic change. 3.    No acute findings.   He had a cervical spine MRI without contrast on 09/24/2020 and I reviewed the results: IMPRESSION: 1. Multilevel spondylosis of the cervical spine as described. 2. Mild bilateral foraminal narrowing at C2-3. 3.  Severe foraminal narrowing bilaterally at C3-4. 4. Moderate foraminal narrowing bilaterally at C4-5 and C5-6. 5. Moderate right and mild left foraminal narrowing at C6-7. 6. Central canal stenosis is greatest at C4-5 and C5-6 with effacement of the ventral CSF but no abnormal cord signal.   Lumbar spine MRI without contrast on 09/02/2020 and I reviewed the results: IMPRESSION: 1. 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 Correlation with radiographs is recommended prior to any operative intervention. 2. Severe left foraminal stenosis at L5-S1. Moderate foraminal stenosis on the right at L1-L2 and L2-L3 and the left at L4-L5. Mild-to-moderate bilateral foraminal stenosis at L3-L4. 3. Moderate canal stenosis at L4-L5 and mild canal stenosis at L3-L4 with mild-to-moderate left subarticular recess stenosis at both levels. Moderate left subarticular recess stenosis at L5-S1. 4. Dextrocurvature centered at L2.     10/25/2020 (Dr. Anne Hahn): <<Mr. Captain is a 77 year old right-handed white male with a history of essential tremor.  The patient has both  resting and action components of the tremor that has been present for a number years.  He reports a gradually worsening gait problem, he has not had any recent falls.  He did have physical therapy almost a year ago which was helpful.  The patient reports that the resting component of the tremor is worse on the left than the right.  He denies any tremor affecting the head or neck or speech.  He reports that his brother was given a diagnosis of Parkinson's disease.  He also reports some troubles with memory that has been present for about 6 months.  He recently had MRI of the brain that did show some generalized atrophy.  He reports trouble with handwriting and with using a mouse.  He can do better using the mouse with the left hand than the right.  He is still working,  but he is considering retiring in the near future.>>    His Past Medical History Is Significant For: Past Medical History:  Diagnosis Date   ADHD (attention deficit hyperactivity disorder)    B12 deficiency    BPH (benign prostatic hypertrophy)    Chronic lymphocytic leukemia (CLL), T-cell (HCC) DX 1996--  ONCOLOGIST-  DR Truett Perna   PT IS ASYMPTOMATIC--- LAST CBC W/ DIFF 06-24-2012 STABLE   Coronary artery disease CARDIOLOGIST- DR Verdis Prime   S/P STENTING LAD 1999 // Myoview 01/2019: EF 62, normal perfusion; Low Risk   Crohn's disease of ileum (HCC) SINCE 1988   ED (erectile dysfunction)    Elevated PSA    Essential and other specified forms of tremor 11/25/2012   Gait abnormality 05/17/2020   H/O adenomatous polyp of colon    Hyperlipidemia    Hypertension    Nocturia    OA (osteoarthritis)    Peripheral neuropathy    hx of, none current as of 08-04-13   Peyronie disease    S/P coronary artery stent placement OCT 1999 OF LAD   Tremor, hereditary, benign MILD RIGHT HAND    His Past Surgical History Is Significant For: Past Surgical History:  Procedure Laterality Date   CARDIOVASCULAR STRESS TEST  11-01-2010 DR Verdis Prime   NORMAL PERFUSION STUDY/ EF 64%/ NO ISCHEMIA   cataract surgery  Bilateral feburary 2020   with lens placement    COLONOSCOPY WITH PROPOFOL N/A 09/24/2012   Procedure: COLONOSCOPY WITH PROPOFOL;  Surgeon: Charolett Bumpers, MD;  Location: [facility];  Service: Endoscopy;  Laterality: N/A;   CORONARY ANGIOPLASTY WITH STENT PLACEMENT  OCT 1999   STENT OF LAD   CORONARY STENT INTERVENTION N/A 02/14/2019   Procedure: CORONARY STENT INTERVENTION;  Surgeon: Lyn Records, MD;  Location: [facility];  Service: Cardiovascular;  Laterality: N/A;   CYSTOSCOPY WITH URETHRAL DILATATION  03/12/2017   Procedure: CYSTOSCOPY WITH URETHRAL DILATATION;  Surgeon: Ollen Gross, MD;  Location: [facility];  Service: Orthopedics;;  [name], Resident Assisting    CYSTOSCOPY WITH URETHRAL DILATATION N/A 10/03/2018   Procedure: CYSTOSCOPY WITH URETHRAL BALLOON DILATATION WITH BILATERAL RETROGRADE PYELOGRAPHY;  Surgeon: Heloise Purpura, MD;  Location: [facility];  Service: Urology;  Laterality: N/A;   CYSTOSCOPY WITH URETHRAL DILATATION N/A 12/30/2020   Procedure: CYSTOSCOPY WITH BALLOON DILATATION OF URETHRAL STRICTURE;  Surgeon: Heloise Purpura, MD;  Location: [facility];  Service: Urology;  Laterality: N/A;   LAPAROSCOPIC INGUINAL HERNIA REPAIR Bilateral 01-10-2004   W/ MESH   LEFT HEART CATH AND CORONARY ANGIOGRAPHY N/A 02/13/2019   Procedure: LEFT HEART CATH AND CORONARY ANGIOGRAPHY;  Surgeon: Lyn Records, MD;  Location: [facility] INVASIVE CV LAB;  Service: Cardiovascular;  Laterality: N/A;   neck benign removed from neck  yrs ago   PROSTATE BIOPSY N/A 07/12/2012   Procedure: PROSTATE BIOPSY AND ULTRASOUND;  Surgeon: Kathi Ludwig, MD;  Location: [facility];  Service: Urology;  Laterality: N/A;   PROSTATE SURGERY  2002   tuna   REMOVAL LEFT NECK LYMPH NODE  1996   TONSILLECTOMY  CHILD   TOTAL KNEE ARTHROPLASTY Right 03/12/2017   Procedure: RIGHT TOTAL KNEE ARTHROPLASTY;  Surgeon: Ollen Gross, MD;  Location: [facility];  Service: Orthopedics;  Laterality: Right;  Adductor Block   TRANSURETHRAL RESECTION OF PROSTATE N/A 08/08/2013   Procedure: TRANSURETHRAL RESECTION OF THE PROSTATE WITH GYRUS INSTRUMENTS;  Surgeon: Kathi Ludwig, MD;  Location: [facility];  Service: Urology;  Laterality: N/A;   TRANSURETHRAL RESECTION OF PROSTATE      His Family History Is Significant For: Family History  Problem Relation Age of Onset   Obesity Brother    Diabetes Brother    Parkinsonism Brother     His Social History Is Significant For: Social History   Socioeconomic History   Marital status: Married    Spouse name: Alona Bene   Number of children: 3   Years of education: 16   Highest education level: Bachelor's degree (e.g., BA, AB, BS)  Occupational  History   Occupation: part time    Comment: self employed IT trainer  Tobacco Use   Smoking status: Former    Current packs/day: 0.00    Average packs/day: 1 pack/day for 10.0 years (10.0 ttl pk-yrs)    Types: Cigarettes    Start date: 07/10/1974    Quit date: 07/09/1984    Years since quitting: 38.3   Smokeless tobacco: Never  Vaping Use   Vaping status: Never Used  Substance and Sexual Activity   Alcohol use: Not Currently    Alcohol/week: 7.0 standard drinks of alcohol    Types: 7 Shots of liquor per week    Comment: daily 1 per day ,    Drug use: No   Sexual activity: Not on file  Other Topics Concern   Not on file  Social History Narrative   Lives at home with is wife, Alona Bene.     Right handed   Drinks 2-3 cups coffee daily   Social Determinants of Health   Financial Resource Strain: Not on file  Food Insecurity: Low Risk  (09/28/2022)   Received from [facility]   Hunger Vital Sign    Worried About Running Out of Food in the Last Year: Never true    Ran Out of Food in the Last Year: Never true  Transportation Needs: Not on file (09/28/2022)  Physical Activity: Insufficiently Active (03/20/2019)   Exercise Vital Sign    Days of Exercise per Week: 3 days    Minutes of Exercise per Session: 40 min  Stress: Not on file  Social Connections: Unknown (03/20/2019)   Social Connection and Isolation Panel [NHANES]    Frequency of Communication with Friends and Family: More than three times a week    Frequency of Social Gatherings with Friends and Family: Once a week    Attends Religious Services: Not on Marketing executive or Organizations: Not on file    Attends Banker Meetings: Not on file    Marital Status: Married    His Allergies Are:  No Known Allergies:  His Current Medications Are:  Outpatient Encounter Medications as of 11/08/2022  Medication Sig   acetaminophen (TYLENOL) 500 MG tablet Take 1,000 mg by mouth at bedtime as needed for mild pain  (joint pain).    amphetamine-dextroamphetamine (ADDERALL) 5 MG tablet Take 1 tablet (5 mg total) by mouth 2 (two) times daily.   aspirin EC 81 MG tablet Take 1 tablet (81 mg total) by mouth daily. Swallow whole.   calcium citrate (CALCITRATE - DOSED IN MG ELEMENTAL CALCIUM) 950 (200 Ca) MG tablet Take 200 mg of elemental calcium by mouth daily.   Cholecalciferol 25 MCG (1000 UT) tablet Take 1,000 Units by mouth daily.   Clobetasol Prop Emollient Base (CLOBETASOL PROPIONATE E) 0.05 % emollient cream Apply 1 application topically 2 (two) times daily.   Coenzyme Q10 (CO Q-10) 200 MG CAPS Take 200 mg by mouth daily.    diclofenac Sodium (VOLTAREN) 1 % GEL Apply 1 application topically 4 (four) times daily as needed (pain).   famotidine (PEPCID) 20 MG tablet Take 20 mg by mouth every morning.   ketoconazole (NIZORAL) 2 % cream Apply 1 Application topically daily.   metoprolol succinate (TOPROL-XL) 25 MG 24 hr tablet TAKE 1 TABLET BY MOUTH ONCE DAILY   pantoprazole (PROTONIX) 40 MG tablet TAKE 1 TABLET BY MOUTH  DAILY   primidone (MYSOLINE) 50 MG tablet Take 1.5 tablets (75 mg total) by mouth at bedtime.   rosuvastatin (CRESTOR) 20 MG tablet Take 1 tablet (20 mg total) by mouth daily.   cephALEXin (KEFLEX) 500 MG capsule Take 1 capsule (500 mg total) by mouth 2 (two) times daily.   Cyanocobalamin (VITAMIN B-12) 2500 MCG SUBL Take 2,000 mcg by mouth daily. Reports taking 2,000 mcg capsule   ezetimibe (ZETIA) 10 MG tablet Take 1 tablet (10 mg total) by mouth daily.   No facility-administered encounter medications on file as of 11/08/2022.  :  Review of Systems:  Out of a complete 14 point review of systems, all are reviewed and negative with the exception of these symptoms as listed below:  Review of Systems  Neurological:        Pt states here for balance,tremors and gait issues . Pt  and wife states 3  falls in the last six months     Objective:  Neurological Exam  Physical Exam Physical  Examination:   Vitals:   11/08/22 0734  BP: (!) 159/70  Pulse: (!) 54    General Examination: The patient is a very pleasant 77 y.o. male in no acute distress. He appears well-developed and well-nourished and well groomed.   HEENT: Normocephalic, atraumatic, pupils are equal, round and reactive to light.  Extraocular tracking is well-preserved, no nystagmus, face is symmetric, no obvious facial masking.  Corrective eyeglasses in place.  Speech is clear without voice tremor, no hypophonia or dysarthria.  No lip, neck or jaw tremor, no carotid bruits, shorter neck noted. Unequal shoulder height, right shoulder higher than left. Oropharynx exam reveals: mild mouth dryness, moderate airway crowding.  Tongue protrudes centrally and palate elevates symmetrically.  Significant neck and upper body stoop.   Chest: Clear to auscultation without wheezing, rhonchi or crackles noted.   Heart: S1+S2+0, regular and normal without murmurs, rubs or gallops noted.    Abdomen: Soft, non-tender and non-distended.   Extremities: There is no pitting edema in the distal lower extremities bilaterally.    Skin: Warm and dry without trophic changes noted.    Musculoskeletal: exam reveals arthritic changes in both  hands.   Abnormal curvature of the upper and mid back, evidence of scoliosis.  Unequal shoulder height.   Neurologically:  Mental status: The patient is awake, alert and oriented in all 4 spheres. His immediate and remote memory, attention, language skills and fund of knowledge are appropriate. There is no evidence of aphasia, agnosia, apraxia or anomia. Speech is clear with normal prosody and enunciation. Thought process is linear. Mood is normal and affect is normal.  Cranial nerves II - XII are as described above under HEENT exam.  Motor exam: Normal bulk, strength and tone is noted. There is an intermittent mild resting tremor in both upper extremities.  He has a mild postural and action tremor, no  obvious intention tremor.  No lower extremity tremor.  Romberg is not tested for safety concerns, fine motor skills are mildly impaired bilaterally, no lateralization.   Cerebellar testing: No dysmetria or intention tremor. There is no truncal or gait ataxia.  Finger-to-nose without obvious dysmetria, heel-to-shin doable. Sensory exam: intact to light touch in the upper and lower extremities.  Gait, station and balance: He stands up slow, does not require assistance.  He stands wider based and feet pointed outwards.  He can walk without his multipronged cane, no shuffling, preserved arm swing, does not pick up his feet very well.  He has difficulty with turns, balance is impaired.  He walks slowly and very cautiously.  He walks a little better with his cane.    Assessment and Plan:  In summary, Cole Martinez is a 77 year old right-handed gentleman with an underlying complex medical history of coronary artery disease, s/p stent placement, hypertension, hyperlipidemia, elevated PSA, urethral stricture, Crohn's disease, CLL, ADHD, vitamin B12 deficiency, osteoarthritis, neuropathy, gait d/o, and tremor, who presents for follow-up consultation of his tremor and gait d/o, concern for NPH.  History and particularly imaging testing does not favor NPH in my opinion.  He has had several falls.  Balance can be affected secondary to suboptimal hydration, medication side effects including the Mysoline, previous falls, abnormal posture, and tremor.  We had an extensive discussion today, I explained the evaluation for NPH including the lumbar puncture with high-volume CSF tap, and evaluation with physical therapy before and after a LP.  He is reluctant to proceed.  I explained to him that we can postpone an LP for now and work on lifestyle modification.  He is encouraged to start using his walker for gait safety.  He is advised to drink 48 to 64 ounces of water per day, and eliminate alcohol completely for now.  He is  advised to follow-up with urology.  He is furthermore advised to consider a DaTscan, he would be willing to proceed.  I explained the DaTscan to the patient and his wife in detail as well and explained to them that while this is not a definitive scan for any type of diagnosis in particular, it does show abnormalities and parkinsonian syndromes including idiopathic Parkinson's disease versus atypical parkinsonism versus Lewy body dementia.  It is typically normal to fairly normal in essential tremor patients.  I have ordered a DaTscan.  We will consider LP at the next appointment.  He is advised to follow-up in 3 months and work on these lifestyle modifications in the interim.  We talked about focused ultrasound treatment and he would like to get a consultation.  I have made a referral to [facility] neurology movement disorders department in that regard.  I answered all their questions today  and the patient and his wife were in agreement.  I spent 45 minutes in total face-to-face time and in reviewing records during pre-charting, more than 50% of which was spent in counseling and coordination of care, reviewing test results, reviewing medications and treatment regimen and/or in discussing or reviewing the diagnosis of multifactorial gait disorder, balance problem, recurrent falls, essential tremor, the prognosis and treatment options. Pertinent laboratory and imaging test results that were available during this visit with the patient were reviewed by me and considered in my medical decision making (see chart for details).

## 2022-11-08 NOTE — Patient Instructions (Addendum)
As discussed, your recent brain MRI showed some widening of the fluid spaces of the brain, a finding that we call ventriculomegaly.  This can be seen in a condition called normal pressure hydrocephalus (NPH), meaning that there is excess fluid pressure on your brain, symptoms can include memory loss, gait disorder, falls, and bladder dysfunction including incontinence.    You do have some symptoms that could tie in with underlying normal pressure hydrocephalus.  However, based on the MRI report and after reviewing your CT scan of your brain from April, you may not actually have NPH, as the scans don't favor it.   As discussed, to evaluate for this condition, we typically recommend a spinal fluid test, in particular, a large-volume spinal tap, taking cerebrospinal fluid off and relieve some of the pressure on the brain.  We send the fluid to the lab for routine testing at the time.  It is important to assess your gait before and after and we rely on physical therapy for this.   We do not have to proceed with a lumbar puncture right away.  If you want to think about it and let us know, it is okay.    Down the road, we can also consult with neurosurgery and have them evaluate you for the possibility of a lumbar catheter drainage or lumbar drain which is a procedure consisting of a very small caliber external drainage tube placed through the lumbar spine interspace into the so-called subarachnoid space for removal of fluid.  This is an intermediate solution and can help with diagnosis of normal pressure hydrocephalus.  It would help to assess the effect of drainage of the cerebrospinal fluid on neurological symptoms and signs.  Ultimately, the more long-term treatment for normal pressure hydrocephalus is in the form of a shunt (a so-called VP shunt, VPS), which stands for ventriculoperitoneal shunt, this is a small tube placed in one of the major brain fluid spaces on either side of the brain. It drains excess  fluid on an ongoing basis.  This is also done by a neurosurgeon.    I believe you have a multifactorial gait disorder, meaning, that it is Micheline Rough is due to a combination of factors. These factors include: normal aging, change in posture, degenerative arthritis, tremor, tendency to dehydration, and daily alcohol consumption.   Please remember to stand up slowly and get your bearings first turn slowly, no bending down to pick anything, no heavy lifting, be extra careful at night and first thing in the morning. Also, be careful in the Bathroom and the kitchen.   Remember to drink plenty of fluid, eat healthy meals and do not skip any meals. Try to eat protein with a every meal and eat a healthy snack such as fruit or nuts or yogurt in between meals. Try to keep a regular sleep-wake schedule and try to exercise daily, particularly in the form of walking, 20-30 minutes a day, if you can. You should consider using your walker, rather than a cane.   As far as your medications are concerned, I would like to suggest no new medications.   As far as diagnostic testing: we will proceed with a DaT scan: This is a specialized brain scan designed to help with diagnosis of tremor disorders. A radioactive marker gets injected and the uptake is measured in the brain and compared to normal controls and right side is compared to the left, a change in uptake can help with diagnosis of certain tremor disorders. A  brain MRI on the other hand is a brain scan that helps look at the brain structure in more detail overall and look for age-related changes, blood vessel related changes and look for stroke and volume loss which we call atrophy.   Please make a follow up appointment with your urologist.   I will make a referral to [facility] neurology for evaluation for focused ultrasound treatment for essential tremor.

## 2022-11-09 LAB — COMPREHENSIVE METABOLIC PANEL
ALT: 19 IU/L (ref 0–44)
AST: 20 IU/L (ref 0–40)
Albumin: 4.3 g/dL (ref 3.8–4.8)
Alkaline Phosphatase: 76 IU/L (ref 44–121)
BUN/Creatinine Ratio: 17 (ref 10–24)
BUN: 18 mg/dL (ref 8–27)
Bilirubin Total: 0.6 mg/dL (ref 0.0–1.2)
CO2: 24 mmol/L (ref 20–29)
Calcium: 9 mg/dL (ref 8.6–10.2)
Chloride: 103 mmol/L (ref 96–106)
Creatinine, Ser: 1.05 mg/dL (ref 0.76–1.27)
Globulin, Total: 2 g/dL (ref 1.5–4.5)
Glucose: 99 mg/dL (ref 70–99)
Potassium: 4.8 mmol/L (ref 3.5–5.2)
Sodium: 141 mmol/L (ref 134–144)
Total Protein: 6.3 g/dL (ref 6.0–8.5)
eGFR: 73 mL/min/{1.73_m2} (ref >59)

## 2022-11-13 ENCOUNTER — Telehealth: Payer: Self-pay | Admitting: Neurology

## 2022-11-13 NOTE — Telephone Encounter (Signed)
Referral for neurology fax to [facility]. Phone: [phone], Fax: [phone].

## 2022-11-15 ENCOUNTER — Encounter (HOSPITAL_COMMUNITY)
Admission: RE | Admit: 2022-11-15 | Discharge: 2022-11-15 | Disposition: A | Discharge status: Home / Self Care | Payer: Medicare Other | Source: Ambulatory Visit | Attending: Neurology | Admitting: Neurology

## 2022-11-15 DIAGNOSIS — G9389 Other specified disorders of brain: Secondary | ICD-10-CM | POA: Insufficient documentation

## 2022-11-15 DIAGNOSIS — R2689 Other abnormalities of gait and mobility: Secondary | ICD-10-CM | POA: Insufficient documentation

## 2022-11-15 DIAGNOSIS — R296 Repeated falls: Secondary | ICD-10-CM | POA: Insufficient documentation

## 2022-11-15 DIAGNOSIS — Z789 Other specified health status: Secondary | ICD-10-CM | POA: Insufficient documentation

## 2022-11-15 DIAGNOSIS — Z9189 Other specified personal risk factors, not elsewhere classified: Secondary | ICD-10-CM | POA: Diagnosis not present

## 2022-11-15 DIAGNOSIS — G250 Essential tremor: Secondary | ICD-10-CM | POA: Insufficient documentation

## 2022-11-15 DIAGNOSIS — R251 Tremor, unspecified: Secondary | ICD-10-CM | POA: Diagnosis not present

## 2022-11-15 MED ORDER — POTASSIUM IODIDE (ANTIDOTE) 130 MG PO TABS
ORAL_TABLET | ORAL | Status: AC
  Filled 2022-11-15: qty 1

## 2022-11-15 MED ORDER — POTASSIUM IODIDE (ANTIDOTE) 130 MG PO TABS: 130.0000 mg | ORAL_TABLET | Freq: Once | ORAL | Status: DC

## 2022-11-15 MED ORDER — IOFLUPANE I 123 185 MBQ/2.5ML IV SOLN
4.7000 | Freq: Once | INTRAVENOUS | Status: AC | PRN
  Administered 2022-11-15: 4.7 via INTRAVENOUS

## 2022-11-16 DIAGNOSIS — N401 Enlarged prostate with lower urinary tract symptoms: Secondary | ICD-10-CM | POA: Diagnosis not present

## 2022-11-16 DIAGNOSIS — R350 Frequency of micturition: Secondary | ICD-10-CM | POA: Diagnosis not present

## 2022-11-16 DIAGNOSIS — R3915 Urgency of urination: Secondary | ICD-10-CM | POA: Diagnosis not present

## 2022-11-28 NOTE — Progress Notes (Unsigned)
Cardiology Office Note    Patient Name: Cole Martinez Date of Encounter: 11/28/2022  Primary Care Provider:  System, Provider Not In Primary Cardiologist:  Orbie Pyo, MD Primary Electrophysiologist: None   Past Medical History    Past Medical History:  Diagnosis Date   ADHD (attention deficit hyperactivity disorder)    B12 deficiency    BPH (benign prostatic hypertrophy)    Chronic lymphocytic leukemia (CLL), T-cell (HCC) DX 1996--  ONCOLOGIST-  DR Truett Perna   PT IS ASYMPTOMATIC--- LAST CBC W/ DIFF 06-24-2012 STABLE   Coronary artery disease CARDIOLOGIST- DR Verdis Prime   S/P STENTING LAD 1999 // Myoview 01/2019: EF 62, normal perfusion; Low Risk   Crohn's disease of ileum (HCC) SINCE 1988   ED (erectile dysfunction)    Elevated PSA    Essential and other specified forms of tremor 11/25/2012   Gait abnormality 05/17/2020   H/O adenomatous polyp of colon    Hyperlipidemia    Hypertension    Nocturia    OA (osteoarthritis)    Peripheral neuropathy    hx of, none current as of 08-04-13   Peyronie disease    S/P coronary artery stent placement OCT 1999 OF LAD   Tremor, hereditary, benign MILD RIGHT HAND    History of Present Illness  Cole Martinez is a 77 y.o. male with a PMH of CAD s/p BMS to LAD 1999, LHC 02/2019 with in-stent restenosis of 70-80% treated medically and severely occluded RCA treated with PCI/DES x 1, HTN, HLD who presents today for 87-month follow-up.  Cole Martinez was previously followed by Dr. Katrinka Blazing and is currently followed by Dr. Lynnette Caffey for management of CAD.  He underwent a BMS to LAD in 1999 with LHC performed in 02/2019 and in-stent restenosis of BMS treated medically and occluded RCA treated with PCI/DES x 1.  Most recent echo completed 04/2022 with EF of 55 to 60% and mild LVH with no RWMA and no significant valve abnormalities.  He was last seen on 03/14/2022 by Dr.Thukkani for follow-up and to establish care and patient was doing well with no  angina but endorsed left-sided chest pain when stressed He was referred to lipid clinic for further evaluation and insight on memory dysfunction with atorvastatin.  He was switched to Crestor and ezetimibe was added due to elevated LP(a).  During today's visit the patient reports*** .  Patient denies chest pain, palpitations, dyspnea, PND, orthopnea, nausea, vomiting, dizziness, syncope, edema, weight gain, or early satiety.  ***Notes: -Last ischemic evaluation: -Last echo: -Interim ED visits: Review of Systems  Please see the history of present illness.    All other systems reviewed and are otherwise negative except as noted above.  Physical Exam    Wt Readings from Last 3 Encounters:  11/08/22 176 lb (79.8 kg)  06/21/22 175 lb (79.4 kg)  04/04/22 174 lb (78.9 kg)   WU:JWJXB were no vitals filed for this visit.,There is no height or weight on file to calculate BMI. GEN: Well nourished, well developed in no acute distress Neck: No JVD; No carotid bruits Pulmonary: Clear to auscultation without rales, wheezing or rhonchi  Cardiovascular: Normal rate. Regular rhythm. Normal S1. Normal S2.   Murmurs: There is no murmur.  ABDOMEN: Soft, non-tender, non-distended EXTREMITIES:  No edema; No deformity   EKG/LABS/ Recent Cardiac Studies   ECG personally reviewed by me today - ***  Risk Assessment/Calculations:   {Does this patient have ATRIAL FIBRILLATION?:[phone]}      Lab Results  Component Value Date   WBC 8.6 12/13/2021   HGB 15.1 12/13/2021   HCT 46.1 12/13/2021   MCV 92.8 12/13/2021   PLT 238 12/13/2021   Lab Results  Component Value Date   CREATININE 1.05 11/08/2022   BUN 18 11/08/2022   NA 141 11/08/2022   K 4.8 11/08/2022   CL 103 11/08/2022   CO2 24 11/08/2022   Lab Results  Component Value Date   CHOL 111 09/14/2022   HDL 48 09/14/2022   LDLCALC 43 09/14/2022   TRIG 109 09/14/2022   CHOLHDL 2.3 09/14/2022    Lab Results  Component Value Date    HGBA1C 5.7 (H) 03/14/2022   Assessment & Plan    1.  Coronary artery disease: -s/p BMS to LAD in 1999 with in-stent restenosis noted on relook LHC in 2020 along with PCI/DES to RCA and medical management for in-stent restenosis -Today patient reports***  2.  Essential hypertension: -Patient's blood pressure today was***  3.  Hyperlipidemia: -Patient's recent LP(a) was*** -He was switched from atorvastatin to Crestor and ezetimibe was added to current regimen -Most recent LDL cholesterol was***  4.***      Disposition: Follow-up with Orbie Pyo, MD or APP in *** months {Are you ordering a CV Procedure (e.g. stress test, cath, DCCV, TEE, etc)?   Press F2        :[id number]}   Signed, Napoleon Form, Leodis Rains, NP 11/28/2022, 7:50 PM [facility]

## 2022-11-30 ENCOUNTER — Ambulatory Visit: Payer: Medicare Other | Attending: Nurse Practitioner | Admitting: Nurse Practitioner

## 2022-11-30 ENCOUNTER — Encounter: Payer: Self-pay | Admitting: Nurse Practitioner

## 2022-11-30 VITALS — BP 126/74 | HR 67 | Ht 67.0 in | Wt 174.8 lb

## 2022-11-30 DIAGNOSIS — I25119 Atherosclerotic heart disease of native coronary artery with unspecified angina pectoris: Secondary | ICD-10-CM

## 2022-11-30 DIAGNOSIS — R2689 Other abnormalities of gait and mobility: Secondary | ICD-10-CM

## 2022-11-30 DIAGNOSIS — I10 Essential (primary) hypertension: Secondary | ICD-10-CM | POA: Diagnosis not present

## 2022-11-30 DIAGNOSIS — E782 Mixed hyperlipidemia: Secondary | ICD-10-CM | POA: Diagnosis not present

## 2022-11-30 DIAGNOSIS — R600 Localized edema: Secondary | ICD-10-CM

## 2022-11-30 LAB — LIPID PANEL
Chol/HDL Ratio: 2.4 ratio (ref 0.0–5.0)
Cholesterol, Total: 112 mg/dL (ref 100–199)
HDL: 46 mg/dL (ref >39)
LDL Chol Calc (NIH): 48 mg/dL (ref 0–99)
Triglycerides: 93 mg/dL (ref 0–149)
VLDL Cholesterol Cal: 18 mg/dL (ref 5–40)

## 2022-11-30 LAB — HEPATIC FUNCTION PANEL
ALT: 20 IU/L (ref 0–44)
AST: 23 IU/L (ref 0–40)
Albumin: 4.5 g/dL (ref 3.8–4.8)
Alkaline Phosphatase: 75 IU/L (ref 44–121)
Bilirubin Total: 0.7 mg/dL (ref 0.0–1.2)
Bilirubin, Direct: 0.22 mg/dL (ref 0.00–0.40)
Total Protein: 6.4 g/dL (ref 6.0–8.5)

## 2022-11-30 NOTE — Patient Instructions (Signed)
Medication Instructions:  Your physician recommends that you continue on your current medications as directed. Please refer to the Current Medication list given to you today. *If you need a refill on your cardiac medications before your next appointment, please call your pharmacy*   Lab Work: TODAY-LIPIDS & LFT If you have labs (blood work) drawn today and your tests are completely normal, you will receive your results only by: MyChart Message (if you have MyChart) OR A paper copy in the mail If you have any lab test that is abnormal or we need to change your treatment, we will call you to review the results.   Testing/Procedures: NONE ORDERED   Follow-Up: At [facility], you and your health needs are our priority.  As part of our continuing mission to provide you with exceptional heart care, we have created designated Provider Care Teams.  These Care Teams include your primary Cardiologist (physician) and Advanced Practice Providers (APPs -  Physician Assistants and Nurse Practitioners) who all work together to provide you with the care you need, when you need it.  We recommend signing up for the patient portal called "MyChart".  Sign up information is provided on this After Visit Summary.  MyChart is used to connect with patients for Virtual Visits (Telemedicine).  Patients are able to view lab/test results, encounter notes, upcoming appointments, etc.  Non-urgent messages can be sent to your provider as well.   To learn more about what you can do with MyChart, go to [email or web address].    Your next appointment:   12 month(s)  Provider:   Orbie Pyo, MD  or Robin Searing, NP   Other Instructions

## 2022-12-04 DIAGNOSIS — E230 Hypopituitarism: Secondary | ICD-10-CM | POA: Diagnosis not present

## 2022-12-04 DIAGNOSIS — R6882 Decreased libido: Secondary | ICD-10-CM | POA: Diagnosis not present

## 2022-12-04 DIAGNOSIS — R7303 Prediabetes: Secondary | ICD-10-CM | POA: Diagnosis not present

## 2022-12-04 DIAGNOSIS — M8580 Other specified disorders of bone density and structure, unspecified site: Secondary | ICD-10-CM | POA: Diagnosis not present

## 2022-12-10 DIAGNOSIS — L089 Local infection of the skin and subcutaneous tissue, unspecified: Secondary | ICD-10-CM | POA: Diagnosis not present

## 2022-12-14 ENCOUNTER — Other Ambulatory Visit: Payer: Self-pay

## 2022-12-14 ENCOUNTER — Inpatient Hospital Stay (HOSPITAL_BASED_OUTPATIENT_CLINIC_OR_DEPARTMENT_OTHER): Payer: Medicare Other | Admitting: Oncology

## 2022-12-14 ENCOUNTER — Emergency Department (HOSPITAL_BASED_OUTPATIENT_CLINIC_OR_DEPARTMENT_OTHER): Payer: Medicare Other | Admitting: Radiology

## 2022-12-14 ENCOUNTER — Encounter (HOSPITAL_BASED_OUTPATIENT_CLINIC_OR_DEPARTMENT_OTHER): Payer: Self-pay

## 2022-12-14 ENCOUNTER — Inpatient Hospital Stay: Payer: Medicare Other | Attending: Oncology

## 2022-12-14 ENCOUNTER — Emergency Department (HOSPITAL_BASED_OUTPATIENT_CLINIC_OR_DEPARTMENT_OTHER)
Admission: EM | Admit: 2022-12-14 | Discharge: 2022-12-14 | Disposition: A | Discharge status: Home / Self Care | Payer: Medicare Other | Attending: Emergency Medicine | Admitting: Emergency Medicine

## 2022-12-14 VITALS — BP 130/71 | HR 68 | Temp 98.2°F | Resp 18 | Ht 67.0 in | Wt 175.4 lb

## 2022-12-14 DIAGNOSIS — Z79899 Other long term (current) drug therapy: Secondary | ICD-10-CM | POA: Insufficient documentation

## 2022-12-14 DIAGNOSIS — C9110 Chronic lymphocytic leukemia of B-cell type not having achieved remission: Secondary | ICD-10-CM

## 2022-12-14 DIAGNOSIS — M10042 Idiopathic gout, left hand: Secondary | ICD-10-CM | POA: Diagnosis not present

## 2022-12-14 DIAGNOSIS — Z7982 Long term (current) use of aspirin: Secondary | ICD-10-CM | POA: Insufficient documentation

## 2022-12-14 DIAGNOSIS — S60922A Unspecified superficial injury of left hand, initial encounter: Secondary | ICD-10-CM | POA: Diagnosis present

## 2022-12-14 DIAGNOSIS — L03114 Cellulitis of left upper limb: Secondary | ICD-10-CM | POA: Insufficient documentation

## 2022-12-14 DIAGNOSIS — X58XXXA Exposure to other specified factors, initial encounter: Secondary | ICD-10-CM | POA: Insufficient documentation

## 2022-12-14 DIAGNOSIS — M7989 Other specified soft tissue disorders: Secondary | ICD-10-CM | POA: Diagnosis not present

## 2022-12-14 DIAGNOSIS — L03119 Cellulitis of unspecified part of limb: Secondary | ICD-10-CM

## 2022-12-14 DIAGNOSIS — M1812 Unilateral primary osteoarthritis of first carpometacarpal joint, left hand: Secondary | ICD-10-CM | POA: Diagnosis not present

## 2022-12-14 DIAGNOSIS — M109 Gout, unspecified: Secondary | ICD-10-CM

## 2022-12-14 LAB — CBC WITH DIFFERENTIAL/PLATELET
Abs Immature Granulocytes: 0.02 K/uL (ref 0.00–0.07)
Basophils Absolute: 0.1 K/uL (ref 0.0–0.1)
Basophils Relative: 1 %
Eosinophils Absolute: 0.5 K/uL (ref 0.0–0.5)
Eosinophils Relative: 5 %
HCT: 43.9 % (ref 39.0–52.0)
Hemoglobin: 14.8 g/dL (ref 13.0–17.0)
Immature Granulocytes: 0 %
Lymphocytes Relative: 17 %
Lymphs Abs: 1.5 K/uL (ref 0.7–4.0)
MCH: 30.3 pg (ref 26.0–34.0)
MCHC: 33.7 g/dL (ref 30.0–36.0)
MCV: 89.8 fL (ref 80.0–100.0)
Monocytes Absolute: 0.9 K/uL (ref 0.1–1.0)
Monocytes Relative: 10 %
Neutro Abs: 5.8 K/uL (ref 1.7–7.7)
Neutrophils Relative %: 67 %
Platelets: 240 K/uL (ref 150–400)
RBC: 4.89 MIL/uL (ref 4.22–5.81)
RDW: 13.7 % (ref 11.5–15.5)
WBC: 8.7 K/uL (ref 4.0–10.5)
nRBC: 0 % (ref 0.0–0.2)

## 2022-12-14 LAB — BASIC METABOLIC PANEL
Anion gap: 9 (ref 5–15)
BUN: 27 mg/dL — ABNORMAL HIGH (ref 8–23)
CO2: 29 mmol/L (ref 22–32)
Calcium: 9.4 mg/dL (ref 8.9–10.3)
Chloride: 102 mmol/L (ref 98–111)
Creatinine, Ser: 1 mg/dL (ref 0.61–1.24)
GFR, Estimated: >60 mL/min (ref >60)
Glucose, Bld: 116 mg/dL — ABNORMAL HIGH (ref 70–99)
Potassium: 4.2 mmol/L (ref 3.5–5.1)
Sodium: 140 mmol/L (ref 135–145)

## 2022-12-14 LAB — CBC WITH DIFFERENTIAL (CANCER CENTER ONLY)
Abs Immature Granulocytes: 0.02 K/uL (ref 0.00–0.07)
Basophils Absolute: 0.1 K/uL (ref 0.0–0.1)
Basophils Relative: 1 %
Eosinophils Absolute: 0.6 K/uL — ABNORMAL HIGH (ref 0.0–0.5)
Eosinophils Relative: 7 %
HCT: 44.4 % (ref 39.0–52.0)
Hemoglobin: 14.8 g/dL (ref 13.0–17.0)
Immature Granulocytes: 0 %
Lymphocytes Relative: 19 %
Lymphs Abs: 1.5 K/uL (ref 0.7–4.0)
MCH: 30.1 pg (ref 26.0–34.0)
MCHC: 33.3 g/dL (ref 30.0–36.0)
MCV: 90.4 fL (ref 80.0–100.0)
Monocytes Absolute: 0.8 K/uL (ref 0.1–1.0)
Monocytes Relative: 10 %
Neutro Abs: 4.9 K/uL (ref 1.7–7.7)
Neutrophils Relative %: 63 %
Platelet Count: 247 K/uL (ref 150–400)
RBC: 4.91 MIL/uL (ref 4.22–5.81)
RDW: 13.8 % (ref 11.5–15.5)
WBC Count: 7.9 K/uL (ref 4.0–10.5)
nRBC: 0 % (ref 0.0–0.2)

## 2022-12-14 LAB — SEDIMENTATION RATE: Sed Rate: 10 mm/h (ref 0–16)

## 2022-12-14 LAB — C-REACTIVE PROTEIN: CRP: 2.6 mg/dL — ABNORMAL HIGH

## 2022-12-14 LAB — URIC ACID: Uric Acid, Serum: 7.5 mg/dL (ref 3.7–8.6)

## 2022-12-14 MED ORDER — VANCOMYCIN HCL IN DEXTROSE 1-5 GM/200ML-% IV SOLN
1000.0000 mg | Freq: Once | INTRAVENOUS | Status: AC
  Administered 2022-12-14: 1000 mg via INTRAVENOUS
  Filled 2022-12-14: qty 200

## 2022-12-14 MED ORDER — COLCHICINE 0.6 MG PO TABS: 0.6000 mg | ORAL_TABLET | Freq: Every day | ORAL | 0 refills | Status: DC

## 2022-12-14 MED ORDER — COLCHICINE 0.6 MG PO TABS
0.6000 mg | ORAL_TABLET | Freq: Once | ORAL | Status: AC
  Administered 2022-12-14: 0.6 mg via ORAL
  Filled 2022-12-14: qty 1

## 2022-12-14 MED ORDER — COLCHICINE 0.6 MG PO TABS
1.2000 mg | ORAL_TABLET | Freq: Once | ORAL | Status: AC
  Administered 2022-12-14: 1.2 mg via ORAL
  Filled 2022-12-14: qty 2

## 2022-12-14 NOTE — ED Triage Notes (Signed)
Onset weekend of left hand 4th and  5th finger noted swollen and redden.  Thinks got splinter at base of 4th finger.  States on antibiotic from PCP

## 2022-12-14 NOTE — ED Notes (Signed)
RN called lab to update on added Uric acid.

## 2022-12-14 NOTE — ED Notes (Signed)
Provider at bedside to update patient.  

## 2022-12-14 NOTE — ED Provider Notes (Signed)
[place] EMERGENCY DEPARTMENT AT [facility] Provider Note   CSN: [id number] Arrival date & time: 12/14/22  1355     History  Chief Complaint  Patient presents with   Finger Injury    Cole Martinez is a 77 y.o. male.  77 year old right-handed male with history of gout, Crohn's, and lymphoma not currently on treatment who presents to the emergency department with left hand swelling.  Over the weekend says that he started noticing significant pain in his left fourth and fifth fingers.  Has that the swelling has worsened.  Was started on doxycycline and then was switched over to Keflex when his symptoms did not improve (which she is taking now).  Says that it is started spreading up his hand towards his wrist.  No fevers.  No history of surgeries on his hand.  Not on any immunosuppressants.       Home Medications Prior to Admission medications   Medication Sig Start Date End Date Taking? Authorizing Provider  colchicine 0.6 MG tablet Take 1 tablet (0.6 mg total) by mouth daily. 12/14/22 01/13/23 Yes Rondel Baton, MD  acetaminophen (TYLENOL) 500 MG tablet Take 1,000 mg by mouth at bedtime as needed for mild pain (joint pain).     [provider]  amphetamine-dextroamphetamine (ADDERALL) 5 MG tablet Take 1 tablet (5 mg total) by mouth 2 (two) times daily. 12/20/21     aspirin EC 81 MG tablet Take 1 tablet (81 mg total) by mouth daily. Swallow whole. 12/17/20   Lyn Records, MD  Azelastine HCl 137 MCG/SPRAY SOLN Place 1 puff into both nostrils 2 (two) times daily.    [provider]  calcium citrate (CALCITRATE - DOSED IN MG ELEMENTAL CALCIUM) 950 (200 Ca) MG tablet Take 200 mg of elemental calcium by mouth daily.    [provider]  cephALEXin (KEFLEX) 500 MG capsule Take 500 mg by mouth 4 (four) times daily. 12/12/22   [provider]  Cholecalciferol 25 MCG (1000 UT) tablet Take 1,000 Units by mouth daily.    [provider]  Clobetasol Prop Emollient Base (CLOBETASOL PROPIONATE E) 0.05 % emollient cream Apply 1 application topically 2 (two) times daily. 04/27/21   Janalyn Harder, MD  Coenzyme Q10 (CO Q-10) 200 MG CAPS Take 200 mg by mouth daily.     [provider]  Cyanocobalamin (VITAMIN B-12) 2500 MCG SUBL Take 2,000 mcg by mouth daily. Reports taking 2,000 mcg capsule    [provider]  diclofenac Sodium (VOLTAREN) 1 % GEL Apply 1 application topically 4 (four) times daily as needed (pain).    [provider]  doxycycline (VIBRA-TABS) 100 MG tablet Take 100 mg by mouth 2 (two) times daily. 12/10/22   [provider]  ezetimibe (ZETIA) 10 MG tablet Take 1 tablet (10 mg total) by mouth daily. 07/13/22 12/14/22  Orbie Pyo, MD  famotidine (PEPCID) 20 MG tablet Take 20 mg by mouth every morning.    [provider]  ketoconazole (NIZORAL) 2 % cream Apply 1 Application topically daily. 04/28/22   [provider]  metoprolol succinate (TOPROL-XL) 25 MG 24 hr tablet TAKE 1 TABLET BY MOUTH ONCE DAILY 02/09/21   Lyn Records, MD  pantoprazole (PROTONIX) 40 MG tablet TAKE 1 TABLET BY MOUTH  DAILY 01/20/21   Lyn Records, MD  primidone (MYSOLINE) 50 MG tablet Take 1.5 tablets (75 mg total) by mouth at bedtime. 04/04/22   Glean Salvo, NP  rosuvastatin (CRESTOR) 20 MG tablet Take 1 tablet (20 mg total) by mouth daily. 05/09/22   Orbie Pyo, MD      Allergies    Patient has no known allergies.    Review of Systems   Review of Systems  Physical Exam Updated Vital Signs BP (!) 168/94   Pulse 72   Temp 97.9 F (36.6 C)   Resp 19   Ht 5\' 7"  (1.702 m)   SpO2 100%   BMI 27.47 kg/m  Physical Exam Constitutional:      Appearance: Normal appearance.  Cardiovascular:     Rate and Rhythm: Normal rate and regular rhythm.     Pulses: Normal pulses.     Heart sounds: Normal heart sounds. No murmur heard. Musculoskeletal:     Comments: Full range of  motion of left wrist.  No left wrist joint effusion.  Able to fully extend fingers but is unable to fully flex the PIP and DIP of the left fourth finger.  Mild tenderness to palpation along the flexor tendon of the fifth finger but not of the fourth finger.  Radial pulse 2+.  Intact sensation light touch in median, radial, and ulnar nerve distribution.  Neurological:     Mental Status: He is alert.     Left hand:      ED Results / Procedures / Treatments   Labs (all labs ordered are listed, but only abnormal results are displayed) Labs Reviewed  BASIC METABOLIC PANEL - Abnormal; Notable for the following components:      Result Value   Glucose, Bld 116 (*)    BUN 27 (*)    All other components within normal limits  CBC WITH DIFFERENTIAL/PLATELET  SEDIMENTATION RATE  URIC ACID  C-REACTIVE PROTEIN    EKG None  Radiology DG Hand 2 View Left  Result Date: 12/14/2022 CLINICAL DATA:  Left fourth and fifth finger swelling, pain, and redness. Patient thinks he may have gotten a splinter in his finger over the weekend. Given antibiotics from PCP. EXAM: LEFT HAND - 2 VIEW COMPARISON:  None Available. FINDINGS: There is diffuse decreased bone mineralization. Severe thumb carpometacarpal joint space narrowing, subchondral sclerosis, and peripheral osteophytosis. Mild thumb metacarpophalangeal and interphalangeal joint space narrowing and peripheral osteophytosis. Severe second and third PIP and DIP joint space narrowing and peripheral osteophytosis. Severe fifth DIP joint space narrowing with mild-to-moderate peripheral osteophytosis. Moderate to severe fourth PIP and DIP and fifth PIP joint space narrowing. There is moderate second and fourth and mild-to-moderate third and fifth finger soft tissue swelling. There are subchondral lucencies measuring 5 mm within the lateral base of the middle phalanx and 3 mm within the distal lateral aspect of the proximal phalanx of the fourth finger at the  PIP joint, possible degenerative subchondral cysts. Subchondral erosions can have a similar appearance. Otherwise, no definite cortical erosion is seen. No subcutaneous air. There individual punctate calcific densities overlying the proximal third and fourth finger soft tissues. Otherwise, no radiopaque foreign body is seen. IMPRESSION: 1. Moderate second and fourth and mild-to-moderate third and fifth finger soft tissue swelling. 2. Subchondral lucencies within the lateral base of the middle phalanx and distal lateral aspect of the proximal phalanx of the fourth finger at the PIP joint, possible degenerative subchondral cysts. Subchondral erosions as can be seen with inflammatory arthropathy can have a similar appearance. Otherwise, no definite cortical erosion is seen. 3. Severe thumb carpometacarpal and second through fifth PIP and DIP osteoarthritis. 4. No radiopaque foreign  body is seen. Electronically Signed   By: Neita Garnet M.D.   On: 12/14/2022 16:00    Procedures Procedures    Medications Ordered in ED Medications  vancomycin (VANCOCIN) IVPB 1000 mg/200 mL premix (0 mg Intravenous Stopped 12/14/22 1830)    And  vancomycin (VANCOCIN) IVPB 1000 mg/200 mL premix (0 mg Intravenous Stopped 12/14/22 1719)  colchicine tablet 1.2 mg (1.2 mg Oral Given 12/14/22 1706)  colchicine tablet 0.6 mg (0.6 mg Oral Given 12/14/22 1857)    ED Course/ Medical Decision Making/ A&P Clinical Course as of 12/14/22 1859  Thu Dec 14, 2022  1640 Dr Merlyn Lot [RP]    Clinical Course User Index [RP] Rondel Baton, MD                                 Medical Decision Making Amount and/or Complexity of Data Reviewed Labs: ordered. Radiology: ordered.  Risk Prescription drug management.   DARWYN PONZO is a 77 y.o. male with comorbidities that complicate the patient evaluation including gout, Crohn's, and lymphoma not currently on treatment who presents to the emergency department with left hand pain  and swelling  Initial Ddx:  Hand cellulitis, flexor tenosynovitis, gout, rheumatoid arthritis/inflammatory arthritis  MDM/Course:  Patient presents emergency department with atraumatic left hand pain.  Initially was concerned about possible infection versus gout.  Given a dose of IV vancomycin.`  Orthopedics consulted after his labs returned and showed a normal white blood cell count and x-ray without any acute findings.  They felt that his symptoms likely were due to gout especially in the absence of fever and normal white blood cell count which would be expected after several days of symptoms.  Will have him follow-up with him as an outpatient.  ESR was sent and was also found to be WNL making infection slightly less likely.  Was given colchicine and upon re-evaluation was feeling better.  Given a prescription of colchicine and was instructed to continue taking his Keflex and doxycycline in case of overlying cellulitis.  Did perform shared decision making with the patient and said that we could admit him for observation IV antibiotics to ensure that his symptoms do not worsen but he stated that he preferred to go home at this time.  Return precautions discussed with the patient at the time of discharge.  This patient presents to the ED for concern of complaints listed in HPI, this involves an extensive number of treatment options, and is a complaint that carries with it a high risk of complications and morbidity. Disposition including potential need for admission considered.   Dispo: DC Home. Return precautions discussed including, but not limited to, those listed in the AVS. Allowed pt time to ask questions which were answered fully prior to dc.  Records reviewed Outpatient Clinic Notes The following labs were independently interpreted: CBC and show no acute abnormality I independently reviewed the following imaging with scope of interpretation limited to determining acute life threatening  conditions related to emergency care: Extremity x-ray(s) and agree with the radiologist interpretation with the following exceptions: none I personally reviewed and interpreted cardiac monitoring: normal sinus rhythm  I personally reviewed and interpreted the pt's EKG: see above for interpretation  I have reviewed the patients home medications and made adjustments as needed Consults:  Hand surgery Social Determinants of health:  Elderly  Portions of this note were generated with Scientist, clinical (histocompatibility and immunogenetics). Dictation errors may  occur despite best attempts at proofreading.           Final Clinical Impression(s) / ED Diagnoses Final diagnoses:  Acute gout of left hand, unspecified cause  Cellulitis of dorsum of hand    Rx / DC Orders ED Discharge Orders          Ordered    colchicine 0.6 MG tablet  Daily        12/14/22 1850              Rondel Baton, MD 12/14/22 1859

## 2022-12-14 NOTE — ED Notes (Signed)
Patient given snack, NPO order discontinued per Dr. Eloise Harman verbal order.

## 2022-12-14 NOTE — Progress Notes (Signed)
ED Pharmacy Antibiotic Sign Off An antibiotic consult was received from an ED provider for vancomycin per pharmacy dosing for cellulitis. A chart review was completed to assess appropriateness.   The following one time order(s) were placed:  Vancomycin 1000mg  x2 (2000mg  total)   Further antibiotic and/or antibiotic pharmacy consults should be ordered by the admitting provider if indicated.   Thank you for allowing pharmacy to be a part of this patient's care.   Estill Batten, PharmD, BCCCP  Clinical Pharmacist 12/14/22 4:02 PM

## 2022-12-14 NOTE — ED Notes (Signed)
 RN reviewed discharge instructions with pt. Pt verbalized understanding and had no further questions. VSS upon discharge.  

## 2022-12-14 NOTE — Discharge Instructions (Addendum)
You were seen for your gout and rash of your hand in the emergency department.   At home, please take the colchicine every day and Tylenol to treat your gout.  You may have infection as well so continue taking the doxycycline and Keflex.    Check your MyChart online for the results of any tests that had not resulted by the time you left the emergency department.   Follow-up with your primary doctor in 2-3 days regarding your visit.  Follow-up with the hand specialist Dr. Merlyn Lot in 1 week.  Return immediately to the emergency department if you experience any of the following: Worsening pain, redness streaking up the arm, fevers, or any other concerning symptoms.    Thank you for visiting our Emergency Department. It was a pleasure taking care of you today.

## 2022-12-14 NOTE — Progress Notes (Signed)
  [facility] OFFICE PROGRESS NOTE   Diagnosis: CLL  INTERVAL HISTORY:   Cole Martinez returns as scheduled.  No palpable lymph nodes.  No fever or night sweats.  He developed erythema at the left fourth finger approximately 5 days ago.  The finger is warm.  He was seen at [facility] urgent care and prescribed Keflex and doxycycline.  The finger has not improved.  The erythema has "spread "to the fifth finger.  No fever.  No other complaint.  Objective:  Vital signs in last 24 hours:  Blood pressure 130/71, pulse 68, temperature 98.2 F (36.8 C), temperature source Temporal, resp. rate 18, height 5\' 7"  (1.702 m), weight 175 lb 6.4 oz (79.6 kg), SpO2 97%.    Lymphatics: No cervical, supraclavicular, axillary, or inguinal nodes Resp: Lungs clear bilaterally Cardio: Regular rate and rhythm GI: No hepatosplenomegaly Vascular: No leg edema  Skin: There is erythema throughout the left fourth finger and left fifth finger.  No fluctuance.  Small abrasion at the proximal aspect of the left fourth finger.  No drainage.  Lab Results:  Lab Results  Component Value Date   WBC 7.9 12/14/2022   HGB 14.8 12/14/2022   HCT 44.4 12/14/2022   MCV 90.4 12/14/2022   PLT 247 12/14/2022   NEUTROABS 4.9 12/14/2022    CMP  Lab Results  Component Value Date   NA 141 11/08/2022   K 4.8 11/08/2022   CL 103 11/08/2022   CO2 24 11/08/2022   GLUCOSE 99 11/08/2022   BUN 18 11/08/2022   CREATININE 1.05 11/08/2022   CALCIUM 9.0 11/08/2022   PROT 6.4 11/30/2022   ALBUMIN 4.5 11/30/2022   AST 23 11/30/2022   ALT 20 11/30/2022   ALKPHOS 75 11/30/2022   BILITOT 0.7 11/30/2022   GFRNONAA >60 12/13/2021   GFRAA >60 04/29/2019    Medications: I have reviewed the patient's current medications.   Assessment/Plan: Chronic lymphocytic leukemia, diagnosed in 1996. He remains asymptomatic and stable from a hematologic standpoint. 2. Pneumococcal 23 vaccine given on 11/30/2015 , 13 valent  pneumonia vaccine given 11/13/2013, pneumococcal 20 01/02/2022      Disposition: Cole Martinez has a history of CLL.  He is asymptomatic and stable from a hematologic standpoint.  He will remain up-to-date on influenza and COVID-19 vaccines.  He appears to have an infection at the left fourth/fifth fingers today.  He is currently maintained on doxycycline and Keflex as prescribed by the [facility] medicine practice.  I recommended he continue antibiotics.  He will follow-up with his primary provider if the fingers did not improve over the next few days.  I suspect he has a cellulitis as opposed to a gout flare.  We recommend he go to the emergency room for a fever/chills or spreading erythema.  Thornton Papas, MD  12/14/2022  10:11 AM

## 2022-12-14 NOTE — Progress Notes (Signed)
Notified by front desk that patient walked in stating he was instructed by the cancer center to see if ID clinic could fit him in for an infection.   Patient is scheduled as a new patient on 10/16. Appears oncology provided ED precautions for worsening symptoms. Advised front desk to have patient reach out to his oncology team for clarification as he is already scheduled and 10/16 is the earliest available appointment with an MD.   Sandie Ano, RN

## 2022-12-19 DIAGNOSIS — H5203 Hypermetropia, bilateral: Secondary | ICD-10-CM | POA: Diagnosis not present

## 2022-12-19 DIAGNOSIS — D3131 Benign neoplasm of right choroid: Secondary | ICD-10-CM | POA: Diagnosis not present

## 2022-12-19 DIAGNOSIS — H52203 Unspecified astigmatism, bilateral: Secondary | ICD-10-CM | POA: Diagnosis not present

## 2022-12-20 ENCOUNTER — Other Ambulatory Visit: Payer: Self-pay

## 2022-12-20 ENCOUNTER — Encounter: Payer: Self-pay | Admitting: Infectious Diseases

## 2022-12-20 ENCOUNTER — Ambulatory Visit (INDEPENDENT_AMBULATORY_CARE_PROVIDER_SITE_OTHER): Payer: Medicare Other | Admitting: Infectious Diseases

## 2022-12-20 VITALS — BP 139/81 | HR 65 | Temp 97.9°F | Wt 174.0 lb

## 2022-12-20 DIAGNOSIS — L0390 Cellulitis, unspecified: Secondary | ICD-10-CM | POA: Insufficient documentation

## 2022-12-20 DIAGNOSIS — Z7185 Encounter for immunization safety counseling: Secondary | ICD-10-CM | POA: Diagnosis not present

## 2022-12-20 DIAGNOSIS — Z79899 Other long term (current) drug therapy: Secondary | ICD-10-CM | POA: Diagnosis not present

## 2022-12-20 NOTE — Progress Notes (Signed)
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   Hypothyroidism 06/04/2020   Increased frequency of urination 06/04/2020   Male hypogonadism 06/04/2020   Muscle pain 06/04/2020   Nocturia 06/04/2020   Obesity 06/04/2020   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   Osteopenia 06/04/2020   Peripheral neurogenic pain 06/04/2020   Pure hypercholesterolemia 06/04/2020   Recurrent falls 06/04/2020   Unspecified kyphosis, thoracic region 06/04/2020   Vitamin B12 deficiency 06/04/2020   Vitamin D deficiency 06/04/2020   Gait abnormality 05/17/2020   Ataxia 03/16/2020   Angina pectoris (HCC) 02/13/2019   Pseudophakia of both eyes 08/01/2018   History of total knee arthroplasty 03/29/2017   Stiffness of right knee 03/16/2017   OA (osteoarthritis) of knee 03/12/2017   Tremor, essential 12/03/2015   Essential hypertension 12/09/2014   Coronary artery disease involving native heart 12/08/2013   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   Hyperlipidemia 12/08/2013   Benign prostatic hyperplasia 08/08/2013   Essential and other specified forms of tremor 11/25/2012    Patient's Medications  New Prescriptions   No medications on file  Previous Medications   ACETAMINOPHEN (TYLENOL) 500 MG TABLET    Take 1,000 mg by mouth at bedtime as needed for mild pain (joint pain).    AMPHETAMINE-DEXTROAMPHETAMINE (ADDERALL) 5 MG TABLET    Take 1 tablet (5 mg total) by mouth 2 (two) times daily.   ASPIRIN EC 81 MG TABLET    Take 1 tablet (81 mg total) by mouth daily.  Swallow whole.   AZELASTINE HCL 137 MCG/SPRAY SOLN    Place 1 puff into both nostrils 2 (two) times daily.   CALCIUM CITRATE (CALCITRATE - DOSED IN MG ELEMENTAL CALCIUM) 950 (200 CA) MG TABLET    Take 200 mg of elemental calcium by mouth daily.   CEPHALEXIN (KEFLEX) 500 MG CAPSULE    Take 500 mg by mouth 4 (four) times daily.   CHOLECALCIFEROL 25 MCG (1000 UT) TABLET    Take 1,000 Units by mouth daily.   CLOBETASOL PROP EMOLLIENT BASE (CLOBETASOL PROPIONATE E) 0.05 % EMOLLIENT CREAM    Apply 1 application topically 2 (two) times daily.   COENZYME Q10 (CO Q-10) 200 MG CAPS    Take 200 mg by mouth daily.    COLCHICINE 0.6 MG TABLET    Take 1 tablet (0.6 mg total) by mouth daily.   CYANOCOBALAMIN (VITAMIN B-12) 2500 MCG SUBL    Take 2,000 mcg by mouth daily. Reports taking 2,000 mcg capsule   DICLOFENAC SODIUM (VOLTAREN) 1 % GEL    Apply 1 application topically 4 (four) times daily as needed (pain).   DOXYCYCLINE (VIBRA-TABS) 100 MG TABLET    Take 100 mg by mouth 2 (two) times daily.   EZETIMIBE (ZETIA) 10 MG TABLET    Take 1 tablet (10 mg total) by mouth daily.   FAMOTIDINE (PEPCID) 20 MG TABLET    Take 20 mg by mouth every morning.   KETOCONAZOLE (NIZORAL) 2 % CREAM    Apply 1 Application topically daily.   METOPROLOL SUCCINATE (TOPROL-XL) 25 MG 24 HR TABLET    TAKE  1 TABLET BY MOUTH ONCE DAILY   PANTOPRAZOLE (PROTONIX) 40 MG TABLET    TAKE 1 TABLET BY MOUTH  DAILY   PRIMIDONE (MYSOLINE) 50 MG TABLET    Take 1.5 tablets (75 mg total) by mouth at bedtime.   ROSUVASTATIN (CRESTOR) 20 MG TABLET    Take 1 tablet (20 mg total) by mouth daily.  Modified Medications   No medications on file  Discontinued Medications   No medications on file    Subjective: 77 Y O Male with h/o CLL ( not on tx), CAD  Crohn's disease( not on tx), HTN, HLD, Hypothyroidism, OA, RT TKA, BPH s/p TURP,  Erectile dysfunction, prior ? Gout,  peripheral neuropathy,  GERD, B/L hearing loss, tremors who is referred for  evaluation of gout vs cellulitis of left hand.  He had a swelling in his left 4th and 5th finger early last week,  initially thought to be due to a splinter.  Was seen by PCP Dr Dorris Fetch at [facility] who prescribed doxycycline and cephalexin. He was then seen in the ED 101/0 due to persistent swelling and redness who thought he had a gout attack. Work up was remarkable for left hand xray with no acute abnormality ( as below) . Afebrile. Normal WBC, ESR and CRP. Discussed with hand surgery who also thought similarly and was given colchicine with improvement. Discharged on colchicine and continued on doxycyline and cephalexin.   Patient denies any prior trauma, insect or animal bite or any surgeries, He completed doxycyline course today and has 7 more pills for cephalexin. Reports redness and swelling has significantly improved. No issues with mobility of left hand or fingers. Denies fevers, chills. Denies nausea, vomiting and diarrhea.  He reports prior h/o ?gout/pseudogout and was on medications previously,  but gout attack used to be in his great toe.   Denies smoking, he has stopped drinking alcohol currently  prior to work up for tremors of b/l hands ( has a fu with Neurology) and denies IVDU.  Review of Systems: denies cough, chest pain, sob. Denies GU symptoms or rashes   Past Medical History:  Diagnosis Date   ADHD (attention deficit hyperactivity disorder)    B12 deficiency    BPH (benign prostatic hypertrophy)    Chronic lymphocytic leukemia (CLL), T-cell (HCC) DX 1996--  ONCOLOGIST-  DR Truett Perna   PT IS ASYMPTOMATIC--- LAST CBC W/ DIFF 06-24-2012 STABLE   Coronary artery disease CARDIOLOGIST- DR Verdis Prime   S/P STENTING LAD 1999 // Myoview 01/2019: EF 62, normal perfusion; Low Risk   Crohn's disease of ileum (HCC) SINCE 1988   ED (erectile dysfunction)    Elevated PSA    Essential and other specified forms of tremor 11/25/2012   Gait abnormality 05/17/2020   H/O adenomatous polyp  of colon    Hyperlipidemia    Hypertension    Nocturia    OA (osteoarthritis)    Peripheral neuropathy    hx of, none current as of 08-04-13   Peyronie disease    S/P coronary artery stent placement OCT 1999 OF LAD   Tremor, hereditary, benign MILD RIGHT HAND   Past Surgical History:  Procedure Laterality Date   CARDIOVASCULAR STRESS TEST  11-01-2010 DR Verdis Prime   NORMAL PERFUSION STUDY/ EF 64%/ NO ISCHEMIA   cataract surgery  Bilateral feburary 2020   with lens placement    COLONOSCOPY WITH PROPOFOL N/A 09/24/2012   Procedure: COLONOSCOPY WITH PROPOFOL;  Surgeon: Charolett Bumpers, MD;  Location: [facility];  Service: Endoscopy;  Laterality: N/A;   CORONARY ANGIOPLASTY WITH STENT PLACEMENT  OCT 1999   STENT OF LAD   CORONARY STENT INTERVENTION N/A 02/14/2019   Procedure: CORONARY STENT INTERVENTION;  Surgeon: Lyn Records, MD;  Location: [facility];  Service: Cardiovascular;  Laterality: N/A;   CYSTOSCOPY WITH URETHRAL DILATATION  03/12/2017   Procedure: CYSTOSCOPY WITH URETHRAL DILATATION;  Surgeon: Ollen Gross, MD;  Location: [facility];  Service: Orthopedics;;  [name], Resident Assisting   CYSTOSCOPY WITH URETHRAL DILATATION N/A 10/03/2018   Procedure: CYSTOSCOPY WITH URETHRAL BALLOON DILATATION WITH BILATERAL RETROGRADE PYELOGRAPHY;  Surgeon: Heloise Purpura, MD;  Location: [facility];  Service: Urology;  Laterality: N/A;   CYSTOSCOPY WITH URETHRAL DILATATION N/A 12/30/2020   Procedure: CYSTOSCOPY WITH BALLOON DILATATION OF URETHRAL STRICTURE;  Surgeon: Heloise Purpura, MD;  Location: [facility];  Service: Urology;  Laterality: N/A;   LAPAROSCOPIC INGUINAL HERNIA REPAIR Bilateral 01-10-2004   W/ MESH   LEFT HEART CATH AND CORONARY ANGIOGRAPHY N/A 02/13/2019   Procedure: LEFT HEART CATH AND CORONARY ANGIOGRAPHY;  Surgeon: Lyn Records, MD;  Location: [facility];  Service: Cardiovascular;  Laterality: N/A;   neck benign removed from neck  yrs ago   PROSTATE BIOPSY N/A  07/12/2012   Procedure: PROSTATE BIOPSY AND ULTRASOUND;  Surgeon: Kathi Ludwig, MD;  Location: [facility];  Service: Urology;  Laterality: N/A;   PROSTATE SURGERY  2002   tuna   REMOVAL LEFT NECK LYMPH NODE  1996   TONSILLECTOMY  CHILD   TOTAL KNEE ARTHROPLASTY Right 03/12/2017   Procedure: RIGHT TOTAL KNEE ARTHROPLASTY;  Surgeon: Ollen Gross, MD;  Location: [facility];  Service: Orthopedics;  Laterality: Right;  Adductor Block   TRANSURETHRAL RESECTION OF PROSTATE N/A 08/08/2013   Procedure: TRANSURETHRAL RESECTION OF THE PROSTATE WITH GYRUS INSTRUMENTS;  Surgeon: Kathi Ludwig, MD;  Location: [facility];  Service: Urology;  Laterality: N/A;   TRANSURETHRAL RESECTION OF PROSTATE       Social History   Tobacco Use   Smoking status: Former    Current packs/day: 0.00    Average packs/day: 1 pack/day for 10.0 years (10.0 ttl pk-yrs)    Types: Cigarettes    Start date: 07/10/1974    Quit date: 07/09/1984    Years since quitting: 38.4   Smokeless tobacco: Never  Vaping Use   Vaping status: Never Used  Substance Use Topics   Alcohol use: Not Currently    Alcohol/week: 7.0 standard drinks of alcohol    Types: 7 Shots of liquor per week    Comment: daily 1 per day ,    Drug use: No    Family History  Problem Relation Age of Onset   Obesity Brother    Diabetes Brother    Parkinsonism Brother     No Known Allergies  Health Maintenance  Topic Date Due   COVID-19 Vaccine (6 - 2023-24 season) 11/05/2022   Medicare Annual Wellness (AWV)  12/23/2022   INFLUENZA VACCINE  01/11/2023 (Originally 10/05/2022)   DTaP/Tdap/Td (2 - Td or Tdap) 06/20/2032   Pneumonia Vaccine 79+ Years old  Completed   Hepatitis C Screening  Completed   Zoster Vaccines- Shingrix  Completed   HPV VACCINES  Aged Out   Colonoscopy  Discontinued    Objective: BP 139/81   Pulse 65   Temp 97.9 F (36.6 C) (Oral)   Wt 174 lb (78.9 kg)   SpO2 99%   BMI 27.25 kg/m  Physical  Exam Constitutional:      Appearance: Normal appearance.  HENT:     Head: Normocephalic and atraumatic.      Mouth: Mucous membranes are moist.  Eyes:    Conjunctiva/sclera: Conjunctivae normal.     Pupils: Pupils are equal, round, and bilaterally symmetrical   Cardiovascular:     Rate and Rhythm: Normal rate and regular rhythm.     Heart sounds: s1s2  Pulmonary:     Effort: Pulmonary effort is normal.     Breath sounds: Normal breath sounds.   Abdominal:     General: Non distended     Palpations: soft.   Musculoskeletal:        General: ambulatory with a cane, tremors in bilateral hands  Left 4th and 5th finger cellulitis has resolved, compared to picture taken in last ED visit. He is able to make a fist and move his fingers without any difficulty    Skin:    General: Skin is warm and dry.     Comments:  Neurological:     General: grossly non focal     Mental Status: awake, alert and oriented to person, place, and time.   Psychiatric:        Mood and Affect: Mood normal.   Lab Results Lab Results  Component Value Date   WBC 8.7 12/14/2022   HGB 14.8 12/14/2022   HCT 43.9 12/14/2022   MCV 89.8 12/14/2022   PLT 240 12/14/2022    Lab Results  Component Value Date   CREATININE 1.00 12/14/2022   BUN 27 (H) 12/14/2022   NA 140 12/14/2022   K 4.2 12/14/2022   CL 102 12/14/2022   CO2 29 12/14/2022    Lab Results  Component Value Date   ALT 20 11/30/2022   AST 23 11/30/2022   ALKPHOS 75 11/30/2022   BILITOT 0.7 11/30/2022    Lab Results  Component Value Date   CHOL 112 11/30/2022   HDL 46 11/30/2022   LDLCALC 48 11/30/2022   TRIG 93 11/30/2022   CHOLHDL 2.4 11/30/2022   No results found for: "LABRPR", "RPRTITER" No results found for: "HIV1RNAQUANT", "HIV1RNAVL", "CD4TABS"  Microbiology Results for orders placed or performed during the hospital encounter of 02/10/19  Novel Coronavirus, NAA (Hosp order, Send-out to [employer]; TAT 18-24 hrs     Status:  None   Collection Time: 02/10/19  1:28 PM   Specimen: Nasopharyngeal Swab; Respiratory  Result Value Ref Range Status   SARS-CoV-2, NAA NOT DETECTED NOT DETECTED Final    Comment: (NOTE) This nucleic acid amplification test was developed and its performance characteristics determined by [employer]. Nucleic acid amplification tests include PCR and TMA. This test has not been FDA cleared or approved. This test has been authorized by FDA under an Emergency Use Authorization (EUA). This test is only authorized for the duration of time the declaration that circumstances exist justifying the authorization of the emergency use of in vitro diagnostic tests for detection of SARS-CoV-2 virus and/or diagnosis of COVID-19 infection under section 564(b)(1) of the Act, 21 U.S.C. 098JXB-1(Y) (1), unless the authorization is terminated or revoked sooner. When diagnostic testing is negative, the possibility of a false negative result should be considered in the context of a patient's recent exposures and the presence of clinical signs and symptoms consistent with COVID-19. An individual without symptoms of COVID- 19 and who is not shedding SARS-CoV-2 vi rus would expect to have a negative (not detected) result in this assay.  Performed At: [facility] [address] [place], [place] [id number] Jolene Schimke MD ZH:[id number]    Coronavirus Source NASOPHARYNGEAL  Final    Comment: Performed at [facility], [address] [place], [place] [address]   Imaging DG Hand 2 View Left  Result Date: 12/14/2022 CLINICAL DATA:  Left fourth and fifth finger swelling, pain, and redness. Patient thinks he may have gotten a splinter in his finger over the weekend. Given antibiotics from PCP. EXAM: LEFT HAND - 2 VIEW COMPARISON:  None Available. FINDINGS: There is diffuse decreased bone mineralization. Severe thumb carpometacarpal joint space narrowing, subchondral sclerosis, and peripheral  osteophytosis. Mild thumb metacarpophalangeal and interphalangeal joint space narrowing and peripheral osteophytosis. Severe second and third PIP and DIP joint space narrowing and peripheral osteophytosis. Severe fifth DIP joint space narrowing with mild-to-moderate peripheral osteophytosis. Moderate to severe fourth PIP and DIP and fifth PIP joint space narrowing. There is moderate second and fourth and mild-to-moderate third and fifth finger soft tissue swelling. There are subchondral lucencies measuring 5 mm within the lateral base of the middle phalanx and 3 mm within the distal lateral aspect of the proximal phalanx of the fourth finger at the PIP joint, possible degenerative subchondral cysts. Subchondral erosions can have a similar appearance. Otherwise, no definite cortical erosion is seen. No subcutaneous air. There individual punctate calcific densities overlying the proximal third and fourth finger soft tissues. Otherwise, no radiopaque foreign body is seen. IMPRESSION: 1. Moderate second and fourth and mild-to-moderate third and fifth finger soft tissue swelling. 2. Subchondral lucencies within the lateral base of the middle phalanx and distal lateral aspect of the proximal phalanx of the fourth finger at the PIP joint, possible degenerative subchondral cysts. Subchondral erosions as can be seen with inflammatory arthropathy can have a similar appearance. Otherwise, no definite cortical erosion is seen. 3. Severe thumb carpometacarpal and second through fifth PIP and DIP osteoarthritis. 4. No radiopaque foreign body is seen. Electronically Signed   By: Neita Garnet M.D.   On: 12/14/2022 16:00    Assessment/Plan # Possible cellulitis of left 4th and 5th finger- clinically improved  # Probable Gout/Pseudo gout - reports prior h/o gout/pseudo gout long time ago but used to affect his great toe.   Plan  - Complete remaining 7 more pills of cephalexin - Fu as needed  - Discussed to fu with PCP for  gout/pseudogout management   # Immunization counseling - he is willing to vaccinate for Flu and  covid after abtx course completed   I have personally spent 62  minutes involved in face-to-face and non-face-to-face activities for this patient on the day of the visit. Professional time spent includes the following activities: Preparing to see the patient (review of tests), Obtaining and/or reviewing separately obtained history (admission/discharge record), Performing a medically appropriate examination and/or evaluation , Ordering medications/tests/procedures, referring and communicating with other health care professionals, Documenting clinical information in the EMR, Independently interpreting results (not separately reported), Communicating results to the patient/family/caregiver, Counseling and educating the patient/family/caregiver and Care coordination (not separately reported).   Victoriano Lain, MD [facility] 12/20/2022, 2:49 PM

## 2022-12-30 DIAGNOSIS — Z23 Encounter for immunization: Secondary | ICD-10-CM | POA: Diagnosis not present

## 2023-01-01 DIAGNOSIS — I872 Venous insufficiency (chronic) (peripheral): Secondary | ICD-10-CM | POA: Diagnosis not present

## 2023-01-01 DIAGNOSIS — E230 Hypopituitarism: Secondary | ICD-10-CM | POA: Diagnosis not present

## 2023-01-01 DIAGNOSIS — Z0000 Encounter for general adult medical examination without abnormal findings: Secondary | ICD-10-CM | POA: Diagnosis not present

## 2023-01-01 DIAGNOSIS — M109 Gout, unspecified: Secondary | ICD-10-CM | POA: Diagnosis not present

## 2023-01-01 DIAGNOSIS — Z1331 Encounter for screening for depression: Secondary | ICD-10-CM | POA: Diagnosis not present

## 2023-01-01 DIAGNOSIS — Z860100 Personal history of colon polyps, unspecified: Secondary | ICD-10-CM | POA: Diagnosis not present

## 2023-01-01 DIAGNOSIS — R7303 Prediabetes: Secondary | ICD-10-CM | POA: Diagnosis not present

## 2023-01-01 DIAGNOSIS — I2510 Atherosclerotic heart disease of native coronary artery without angina pectoris: Secondary | ICD-10-CM | POA: Diagnosis not present

## 2023-01-01 DIAGNOSIS — F900 Attention-deficit hyperactivity disorder, predominantly inattentive type: Secondary | ICD-10-CM | POA: Diagnosis not present

## 2023-01-01 DIAGNOSIS — C9110 Chronic lymphocytic leukemia of B-cell type not having achieved remission: Secondary | ICD-10-CM | POA: Diagnosis not present

## 2023-01-01 DIAGNOSIS — Z23 Encounter for immunization: Secondary | ICD-10-CM | POA: Diagnosis not present

## 2023-01-01 DIAGNOSIS — Z79899 Other long term (current) drug therapy: Secondary | ICD-10-CM | POA: Diagnosis not present

## 2023-01-01 DIAGNOSIS — K219 Gastro-esophageal reflux disease without esophagitis: Secondary | ICD-10-CM | POA: Diagnosis not present

## 2023-01-01 DIAGNOSIS — E669 Obesity, unspecified: Secondary | ICD-10-CM | POA: Diagnosis not present

## 2023-01-01 DIAGNOSIS — K909 Intestinal malabsorption, unspecified: Secondary | ICD-10-CM | POA: Diagnosis not present

## 2023-01-10 DIAGNOSIS — Z1589 Genetic susceptibility to other disease: Secondary | ICD-10-CM | POA: Diagnosis not present

## 2023-01-10 DIAGNOSIS — R270 Ataxia, unspecified: Secondary | ICD-10-CM | POA: Diagnosis not present

## 2023-01-25 ENCOUNTER — Encounter: Payer: Self-pay | Admitting: Neurology

## 2023-02-07 DIAGNOSIS — R350 Frequency of micturition: Secondary | ICD-10-CM | POA: Diagnosis not present

## 2023-02-07 DIAGNOSIS — N35011 Post-traumatic bulbous urethral stricture: Secondary | ICD-10-CM | POA: Diagnosis not present

## 2023-02-07 DIAGNOSIS — N401 Enlarged prostate with lower urinary tract symptoms: Secondary | ICD-10-CM | POA: Diagnosis not present

## 2023-02-11 ENCOUNTER — Encounter: Payer: Self-pay | Admitting: Neurology

## 2023-02-12 ENCOUNTER — Telehealth: Payer: Self-pay | Admitting: Neurology

## 2023-02-12 NOTE — Telephone Encounter (Signed)
Pt cancelled appointment due to being treated at [facility] in [facility]

## 2023-02-15 ENCOUNTER — Ambulatory Visit: Payer: Medicare Other | Admitting: Neurology

## 2023-04-04 DIAGNOSIS — R159 Full incontinence of feces: Secondary | ICD-10-CM | POA: Diagnosis not present

## 2023-04-04 DIAGNOSIS — R2689 Other abnormalities of gait and mobility: Secondary | ICD-10-CM | POA: Diagnosis not present

## 2023-04-04 DIAGNOSIS — Z1589 Genetic susceptibility to other disease: Secondary | ICD-10-CM | POA: Diagnosis not present

## 2023-04-04 DIAGNOSIS — R251 Tremor, unspecified: Secondary | ICD-10-CM | POA: Diagnosis not present

## 2023-04-04 DIAGNOSIS — R419 Unspecified symptoms and signs involving cognitive functions and awareness: Secondary | ICD-10-CM | POA: Diagnosis not present

## 2023-04-06 DIAGNOSIS — L570 Actinic keratosis: Secondary | ICD-10-CM | POA: Diagnosis not present

## 2023-04-06 DIAGNOSIS — B359 Dermatophytosis, unspecified: Secondary | ICD-10-CM | POA: Diagnosis not present

## 2023-04-06 DIAGNOSIS — D225 Melanocytic nevi of trunk: Secondary | ICD-10-CM | POA: Diagnosis not present

## 2023-04-06 DIAGNOSIS — L821 Other seborrheic keratosis: Secondary | ICD-10-CM | POA: Diagnosis not present

## 2023-04-06 DIAGNOSIS — L578 Other skin changes due to chronic exposure to nonionizing radiation: Secondary | ICD-10-CM | POA: Diagnosis not present

## 2023-04-06 DIAGNOSIS — L209 Atopic dermatitis, unspecified: Secondary | ICD-10-CM | POA: Diagnosis not present

## 2023-04-06 DIAGNOSIS — Z86018 Personal history of other benign neoplasm: Secondary | ICD-10-CM | POA: Diagnosis not present

## 2023-04-13 ENCOUNTER — Encounter: Payer: Self-pay | Admitting: Dietician

## 2023-04-13 ENCOUNTER — Encounter: Payer: Medicare Other | Attending: Internal Medicine | Admitting: Dietician

## 2023-04-13 DIAGNOSIS — R7303 Prediabetes: Secondary | ICD-10-CM | POA: Diagnosis not present

## 2023-04-13 NOTE — Progress Notes (Signed)
 Patient was seen on 04/13/2023 for the Core Session 1 of Diabetes Prevention Program course at [facility]. The following learning objectives were met by the patient during this class:   Learning Objectives:  Be able to explain the purpose and benefits of the National Diabetes Prevention Program.  Be able to describe the events that will take place at every session.  Know the weight loss and physical activity goals established by the [facility] Diabetes Prevention Program.  Know their own individual weight loss and physical activity goals.  Be able to explain the important effect of self-monitoring on behavior change.   Goals:  Record food and beverage intake in Food and Activity Tracker over the next week.  Bring completed Food and Activity Tracker for session 1 to session 2 next week. Circle the foods or beverages you think are highest in fat and calories in your food tracker. Read the labels on the food you buy, and consider using measuring cups and spoons to help you calculate the amount you eat. We will talk about measuring in more detail in the coming weeks.   Follow-Up Plan: Attend Core Session 2 next week.  Bring completed Food and Activity Tracker next week to be reviewed by Lifestyle Coach.

## 2023-04-19 ENCOUNTER — Emergency Department (HOSPITAL_BASED_OUTPATIENT_CLINIC_OR_DEPARTMENT_OTHER)
Admission: EM | Admit: 2023-04-19 | Discharge: 2023-04-19 | Disposition: A | Discharge status: Home / Self Care | Payer: Medicare Other | Attending: Emergency Medicine | Admitting: Emergency Medicine

## 2023-04-19 ENCOUNTER — Other Ambulatory Visit: Payer: Self-pay

## 2023-04-19 ENCOUNTER — Encounter (HOSPITAL_BASED_OUTPATIENT_CLINIC_OR_DEPARTMENT_OTHER): Payer: Self-pay | Admitting: Emergency Medicine

## 2023-04-19 ENCOUNTER — Emergency Department (HOSPITAL_BASED_OUTPATIENT_CLINIC_OR_DEPARTMENT_OTHER): Payer: Medicare Other | Admitting: Radiology

## 2023-04-19 DIAGNOSIS — I10 Essential (primary) hypertension: Secondary | ICD-10-CM | POA: Insufficient documentation

## 2023-04-19 DIAGNOSIS — Z7982 Long term (current) use of aspirin: Secondary | ICD-10-CM | POA: Diagnosis not present

## 2023-04-19 DIAGNOSIS — Z79899 Other long term (current) drug therapy: Secondary | ICD-10-CM | POA: Diagnosis not present

## 2023-04-19 DIAGNOSIS — I2510 Atherosclerotic heart disease of native coronary artery without angina pectoris: Secondary | ICD-10-CM | POA: Diagnosis not present

## 2023-04-19 DIAGNOSIS — Z856 Personal history of leukemia: Secondary | ICD-10-CM | POA: Insufficient documentation

## 2023-04-19 DIAGNOSIS — R079 Chest pain, unspecified: Secondary | ICD-10-CM | POA: Diagnosis not present

## 2023-04-19 DIAGNOSIS — R0789 Other chest pain: Secondary | ICD-10-CM | POA: Diagnosis not present

## 2023-04-19 LAB — BASIC METABOLIC PANEL
Anion gap: 10 (ref 5–15)
BUN: 21 mg/dL (ref 8–23)
CO2: 30 mmol/L (ref 22–32)
Calcium: 9.9 mg/dL (ref 8.9–10.3)
Chloride: 98 mmol/L (ref 98–111)
Creatinine, Ser: 0.97 mg/dL (ref 0.61–1.24)
GFR, Estimated: >60 mL/min (ref >60)
Glucose, Bld: 74 mg/dL (ref 70–99)
Potassium: 4 mmol/L (ref 3.5–5.1)
Sodium: 138 mmol/L (ref 135–145)

## 2023-04-19 LAB — CBC
HCT: 48.9 % (ref 39.0–52.0)
Hemoglobin: 16.2 g/dL (ref 13.0–17.0)
MCH: 30.1 pg (ref 26.0–34.0)
MCHC: 33.1 g/dL (ref 30.0–36.0)
MCV: 90.9 fL (ref 80.0–100.0)
Platelets: 250 10*3/uL (ref 150–400)
RBC: 5.38 MIL/uL (ref 4.22–5.81)
RDW: 13.8 % (ref 11.5–15.5)
WBC: 10.4 10*3/uL (ref 4.0–10.5)
nRBC: 0 % (ref 0.0–0.2)

## 2023-04-19 LAB — TROPONIN I (HIGH SENSITIVITY)
Troponin I (High Sensitivity): 5 ng/L (ref <18)
Troponin I (High Sensitivity): 5 ng/L (ref <18)

## 2023-04-19 NOTE — Therapy (Signed)
OUTPATIENT PHYSICAL THERAPY LOWER EXTREMITY EVALUATION   Patient Name: Cole Martinez MRN: [id number] DOB:1946/01/11, 78 y.o., male Today's Date: 04/20/2023  END OF SESSION:  PT End of Session - 04/20/23 1020     Visit Number 1    Number of Visits 12    Date for PT Re-Evaluation 06/01/23    Authorization Type MCR A and B    PT Start Time 0842    PT Stop Time 0944    PT Time Calculation (min) 62 min    Activity Tolerance Patient tolerated treatment well    Behavior During Therapy WFL for tasks assessed/performed             Past Medical History:  Diagnosis Date   ADHD (attention deficit hyperactivity disorder)    B12 deficiency    BPH (benign prostatic hypertrophy)    Chronic lymphocytic leukemia (CLL), T-cell (HCC) DX 1996--  ONCOLOGIST-  DR Truett Perna   PT IS ASYMPTOMATIC--- LAST CBC W/ DIFF 06-24-2012 STABLE   Coronary artery disease CARDIOLOGIST- DR Verdis Prime   S/P STENTING LAD 1999 // Myoview 01/2019: EF 62, normal perfusion; Low Risk   Crohn's disease of ileum (HCC) SINCE 1988   ED (erectile dysfunction)    Elevated PSA    Essential and other specified forms of tremor 11/25/2012   Gait abnormality 05/17/2020   H/O adenomatous polyp of colon    Hyperlipidemia    Hypertension    Nocturia    OA (osteoarthritis)    Peripheral neuropathy    hx of, none current as of 08-04-13   Peyronie disease    S/P coronary artery stent placement OCT 1999 OF LAD   Tremor, hereditary, benign MILD RIGHT HAND   Past Surgical History:  Procedure Laterality Date   CARDIOVASCULAR STRESS TEST  11-01-2010 DR Verdis Prime   NORMAL PERFUSION STUDY/ EF 64%/ NO ISCHEMIA   cataract surgery  Bilateral feburary 2020   with lens placement    COLONOSCOPY WITH PROPOFOL N/A 09/24/2012   Procedure: COLONOSCOPY WITH PROPOFOL;  Surgeon: Charolett Bumpers, MD;  Location: [facility];  Service: Endoscopy;  Laterality: N/A;   CORONARY ANGIOPLASTY WITH STENT PLACEMENT  OCT 1999   STENT OF LAD    CORONARY STENT INTERVENTION N/A 02/14/2019   Procedure: CORONARY STENT INTERVENTION;  Surgeon: Lyn Records, MD;  Location: [facility];  Service: Cardiovascular;  Laterality: N/A;   CYSTOSCOPY WITH URETHRAL DILATATION  03/12/2017   Procedure: CYSTOSCOPY WITH URETHRAL DILATATION;  Surgeon: Ollen Gross, MD;  Location: [facility];  Service: Orthopedics;;  [name], Resident Assisting   CYSTOSCOPY WITH URETHRAL DILATATION N/A 10/03/2018   Procedure: CYSTOSCOPY WITH URETHRAL BALLOON DILATATION WITH BILATERAL RETROGRADE PYELOGRAPHY;  Surgeon: Heloise Purpura, MD;  Location: [facility];  Service: Urology;  Laterality: N/A;   CYSTOSCOPY WITH URETHRAL DILATATION N/A 12/30/2020   Procedure: CYSTOSCOPY WITH BALLOON DILATATION OF URETHRAL STRICTURE;  Surgeon: Heloise Purpura, MD;  Location: [facility];  Service: Urology;  Laterality: N/A;   LAPAROSCOPIC INGUINAL HERNIA REPAIR Bilateral 01-10-2004   W/ MESH   LEFT HEART CATH AND CORONARY ANGIOGRAPHY N/A 02/13/2019   Procedure: LEFT HEART CATH AND CORONARY ANGIOGRAPHY;  Surgeon: Lyn Records, MD;  Location: [facility];  Service: Cardiovascular;  Laterality: N/A;   neck benign removed from neck  yrs ago   PROSTATE BIOPSY N/A 07/12/2012   Procedure: PROSTATE BIOPSY AND ULTRASOUND;  Surgeon: Kathi Ludwig, MD;  Location: [facility];  Service: Urology;  Laterality:  N/A;   PROSTATE SURGERY  2002   tuna   REMOVAL LEFT NECK LYMPH NODE  1996   TONSILLECTOMY  CHILD   TOTAL KNEE ARTHROPLASTY Right 03/12/2017   Procedure: RIGHT TOTAL KNEE ARTHROPLASTY;  Surgeon: Ollen Gross, MD;  Location: [facility];  Service: Orthopedics;  Laterality: Right;  Adductor Block   TRANSURETHRAL RESECTION OF PROSTATE N/A 08/08/2013   Procedure: TRANSURETHRAL RESECTION OF THE PROSTATE WITH GYRUS INSTRUMENTS;  Surgeon: Kathi Ludwig, MD;  Location: [facility];  Service: Urology;  Laterality: N/A;   TRANSURETHRAL RESECTION OF PROSTATE     Patient Active Problem  List   Diagnosis Date Noted   Immunization counseling 12/20/2022   Cellulitis 12/20/2022   Medication management 12/20/2022   Adult attention deficit disorder 06/04/2020   Chronic lymphocytic leukemia (HCC) 06/04/2020   Conductive hearing loss, bilateral 06/04/2020   Crohn's ileitis (HCC) 06/04/2020   Disorder of musculoskeletal system 06/04/2020   Erectile dysfunction 06/04/2020   Gastroesophageal reflux disease 06/04/2020   Gout 06/04/2020   History of adenomatous polyp of colon 06/04/2020   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   Hypothyroidism 06/04/2020   Increased frequency of urination 06/04/2020   Male hypogonadism 06/04/2020   Muscle pain 06/04/2020   Nocturia 06/04/2020   Obesity 06/04/2020   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   Osteopenia 06/04/2020   Peripheral neurogenic pain 06/04/2020   Pure hypercholesterolemia 06/04/2020   Recurrent falls 06/04/2020   Unspecified kyphosis, thoracic region 06/04/2020   Vitamin B12 deficiency 06/04/2020   Vitamin D deficiency 06/04/2020   Gait abnormality 05/17/2020   Ataxia 03/16/2020   Angina pectoris (HCC) 02/13/2019   Pseudophakia of both eyes 08/01/2018   History of total knee arthroplasty 03/29/2017   Stiffness of right knee 03/16/2017   OA (osteoarthritis) of knee 03/12/2017   Tremor, essential 12/03/2015   Essential hypertension 12/09/2014   Coronary artery disease involving native heart 12/08/2013   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   Hyperlipidemia 12/08/2013   Benign prostatic hyperplasia 08/08/2013   Essential and other specified forms of tremor 11/25/2012    PCP: Emilio Aspen, MD  REFERRING PROVIDER: Emilio Aspen, MD  REFERRING DIAG: R26.89  Balance disorder              R26.81  Gait Instability    M62.81 lower extremity weakness  THERAPY DIAG:  Other abnormalities of gait and mobility  Other lack of coordination  Muscle weakness  (generalized)  Rationale for Evaluation and Treatment: Rehabilitation  ONSET DATE: PT order 03/20/2023  SUBJECTIVE:   SUBJECTIVE STATEMENT: Pt states he began having balance, strength, and gait issues about 1.5 years ago.  Pt has been using the cane for about 1 year.  Pt states these sx's have worsened.  Pt had PT last year from February to July.  Pt states PT didn't help.  Pt reports he stopped performing his HEP.  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  He is followed by neurology.   PT order on 03/20/23 indicated pt with LE weakness, gait, and balance disorder, concern for high risk of fall.  Recommends LE strengthening and exercises to improve balance.     Pt had difficulty with ambulation and is limited with distance.  He gets tired easily with walking.  Pt states he is getting weaker.  Pt states he feels unsteady with walking and afraid of falling.  He has trouble lifting objects.  Pt has to use  arms with sit/stand transfers sometimes and sometimes not.  He states his sx's are not consistent.     Pt performs the recumbent bike x 40 mins 2-3x/wk at the gym.  Pt does walk with his wife for 1/2 hour in the neighborhood.  Pt does chair yoga weekly.     Pt went to the ED yesterday due to chest pain. Pt had an EKG and blood work.  Pt states it was negative for heart attack.  He states they are not sure, but thinks it is acid reflux.  He was instructed to follow up with his cardiologist and plans to call today.  Pt denies chest pain currently.      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   PRECAUTIONS: Fall and Other: R TKA, Hx of CLL   WEIGHT BEARING RESTRICTIONS: No  FALLS:  Has patient fallen in last  6 months? Yes. Number of falls 1 when slipping on ice  LIVING ENVIRONMENT: Lives with: lives with their spouse Lives in: 2 story home with rails Stairs: yes Has following equipment at home:  cane, FWW  OCCUPATION: IT trainer, still works some  PLOF: Independent  PATIENT GOALS:  improve strength, feel more comfortable with walking, not feel that he is going to fall  OBJECTIVE:  Note: Objective measures were completed at Evaluation unless otherwise noted.  DIAGNOSTIC FINDINGS: N/A  PATIENT SURVEYS:  ABC scale 75% indicated pt being limited with activities  COGNITION: Overall cognitive status: Within functional limits for tasks assessed      VITALS:  BP: 123/73 HR:  61   POSTURE: slouched seating posture.  Kyphosis and significant FHP.     LOWER EXTREMITY MMT:  MMT Right eval Left eval  Hip flexion 4+/5 5/5  Hip extension    Hip abduction 24.4 23.3  Hip adduction    Hip internal rotation    Hip external rotation    Knee flexion 5/5 seated 5/5 seated  Knee extension 5/5 5/5  Ankle dorsiflexion    Ankle plantarflexion    Ankle inversion    Ankle eversion     (Blank rows = not tested)   FUNCTIONAL TESTS:  TUG:  13.73 sec with cane.  Pt didn't control his lowering to the chair and had some instability with turning requiring assistance from PT. 5x STS:  16.4 sec without Ue's.  Pt did use legs on the back of the chair.    Balance:  Romberg:  > 30 sec without UE support.       Unable to perform tandem stance       Staggered stance:  L LE back:  30 sec, R LE back:  30 sec though had increased sway. Pt had a tremor in R UE at times with balance testing.  GAIT: Assistive device utilized: cane on R Comments: Pt ambulates with cane with bilat toe out and decreased foot clearance L > R.  He has decreased gait speed and has minimal LOB with turning.  Pt did use the wall for one occasion of LOB with turning.  Pt had a tremor in R UE when first walking, but was absent the  following time PT observed his walking.  TREATMENT DATE:   See below for pt education  PATIENT EDUCATION:  Education details: relevant anatomy, objective findings, norms of testing, POC, dx, rationale of interventions, and what to expect next treatment. Person educated: Patient Education method: Explanation Education comprehension: verbalized understanding  HOME EXERCISE PROGRAM: Will give at a later date  ASSESSMENT:  CLINICAL IMPRESSION: Patient is a 78 y.o. male with dx's of balance disorder, gait Instability, and LE weakness.  Pt had PT last year which he states didn't help.  Pt has difficulty with ambulation and is limited with distance.  Pt ambulates with cane.  Pt feels that he is getting weaker.  Pt states he feels unsteady with walking and is afraid of falling.  Pt did fairly well relating to speed on TUG though had decreased safety awareness and control with turning and sitting down.  Pt has gait and balance deficits.  He has weakness in R hip flexion and bilat hip abduction.  Pt should benefit from skilled PT services to improve balance, gait, strength, functional mobility and to reduce fall risk.            OBJECTIVE IMPAIRMENTS: Abnormal gait, decreased activity tolerance, decreased balance, decreased coordination, decreased endurance, decreased mobility, difficulty walking, decreased strength, and postural dysfunction.   ACTIVITY LIMITATIONS: carrying, lifting, standing, squatting, and locomotion level  PARTICIPATION LIMITATIONS: shopping and community activity  PERSONAL FACTORS: Time since onset of injury/illness/exacerbation and 3+ comorbidities: FXAS, R TKA, peripheral neuropathy, CLL  are also affecting patient's functional outcome.   REHAB POTENTIAL: Good  CLINICAL DECISION MAKING: Evolving/moderate complexity  EVALUATION COMPLEXITY:  Moderate   GOALS:  SHORT TERM GOALS: Target date: 05/11/2023  Pt will be independent with HEP for improved balance, strength, and functional mobility. Baseline: Goal status: INITIAL  2.  Pt will demo improved stability with turning with gait.  Baseline:  Goal status: INITIAL  3.  Pt will perform TUG safely including turning and and performing stand to sit transfer with good control and without assistance.  Baseline:  Goal status: INITIAL  4.  Pt will be able to perform tandem stance for at least 20 seconds for improved balance and stability with daily activities.  Baseline:  Goal status: INITIAL Target date:  05/18/2023   LONG TERM GOALS: Target date: 06/01/2023   Pt will demo improved time on 5x STS test by 3 seconds and also improved form by not using his legs on the back of the chair for improved functional LE strength and performance of transfers.  Baseline:  Goal status: INITIAL  2.  Pt will demo improved R hip flexion strength to 5/5 MMT and bilat hip abd strength by at least 6-8 lbs for improved strength to perform functional mobility with increased ease and less difficulty.  Baseline:  Goal status: INITIAL  3.  Will set Berg goal next visit after testing.  Baseline:  Goal status: INITIAL  4.  Pt will report at least a 70% improvement in stability with gait and community ambulation.  Baseline:  Goal status: INITIAL  5.  Pt will report improved confidence with his daily functional mobility.  Baseline:  Goal status: INITIAL     PLAN:  PT FREQUENCY: 2x/week  PT DURATION: 6 weeks  PLANNED INTERVENTIONS: 97164- PT Re-evaluation, 97110-Therapeutic exercises, 97530- Therapeutic activity, [id number]- Neuromuscular re-education, 97535- Self Care, 21308- Manual therapy, [id number]- Gait training, [phone]- Aquatic Therapy, Patient/Family education, Balance training, Stair training, and Taping  PLAN FOR NEXT SESSION: Berg Balance Assessment next visit.  Balance and Gait  training.  Strengthening.  HEP   Audie Clear III PT, DPT 04/20/23 5:44 PM

## 2023-04-19 NOTE — ED Triage Notes (Signed)
Pt via pov from home with chest pain today. Pt denies sob, nausea, emesis. Endorses pain in his left lower back; states it has resolved at this point; has been intermittent in nature. Pt alert & oriented, nad noted.

## 2023-04-19 NOTE — ED Provider Notes (Signed)
[place] EMERGENCY DEPARTMENT AT [facility] Provider Note   CSN: [id number] Arrival date & time: 04/19/23  1832     History  Chief Complaint  Patient presents with   Chest Pain    Cole Martinez is a 78 y.o. male.   Chest Pain 78 year old male history of BPH, CLL, CAD, Crohn's disease, hypertension, hyperlipidemia presented for chest pain.  Patient states symptoms started earlier this afternoon while he was sitting down.  He had a burning sensation which is substernal and went to the left and into his arm briefly.  No nausea or vomiting or diaphoresis.  No shortness of breath.  No back pain or radiation to his back.  He is currently pain-free after taking Tums.  Did feel like his prior heartburn.  No belly pain.  He otherwise feels okay.  No fevers or chills.  No headache or neurologic changes.     Home Medications Prior to Admission medications   Medication Sig Start Date End Date Taking? Authorizing Provider  acetaminophen (TYLENOL) 500 MG tablet Take 1,000 mg by mouth at bedtime as needed for mild pain (joint pain).     [provider]  amphetamine-dextroamphetamine (ADDERALL) 5 MG tablet Take 1 tablet (5 mg total) by mouth 2 (two) times daily. 12/20/21     aspirin EC 81 MG tablet Take 1 tablet (81 mg total) by mouth daily. Swallow whole. 12/17/20   Lyn Records, MD  Azelastine HCl 137 MCG/SPRAY SOLN Place 1 puff into both nostrils 2 (two) times daily.    [provider]  calcium citrate (CALCITRATE - DOSED IN MG ELEMENTAL CALCIUM) 950 (200 Ca) MG tablet Take 200 mg of elemental calcium by mouth daily.    [provider]  cephALEXin (KEFLEX) 500 MG capsule Take 500 mg by mouth 4 (four) times daily. 12/12/22   [provider]  Cholecalciferol 25 MCG (1000 UT) tablet Take 1,000 Units by mouth daily.    [provider]  Clobetasol Prop Emollient Base (CLOBETASOL PROPIONATE E) 0.05 % emollient cream Apply 1 application  topically 2 (two) times daily. 04/27/21   Janalyn Harder, MD  Coenzyme Q10 (CO Q-10) 200 MG CAPS Take 200 mg by mouth daily.     [provider]  colchicine 0.6 MG tablet Take 1 tablet (0.6 mg total) by mouth daily. 12/14/22 01/13/23  Rondel Baton, MD  Cyanocobalamin (VITAMIN B-12) 2500 MCG SUBL Take 2,000 mcg by mouth daily. Reports taking 2,000 mcg capsule    [provider]  diclofenac Sodium (VOLTAREN) 1 % GEL Apply 1 application topically 4 (four) times daily as needed (pain).    [provider]  doxycycline (VIBRA-TABS) 100 MG tablet Take 100 mg by mouth 2 (two) times daily. Patient not taking: Reported on 12/20/2022 12/10/22   [provider]  ezetimibe (ZETIA) 10 MG tablet Take 1 tablet (10 mg total) by mouth daily. 07/13/22 12/14/22  Orbie Pyo, MD  famotidine (PEPCID) 20 MG tablet Take 20 mg by mouth every morning.    [provider]  ketoconazole (NIZORAL) 2 % cream Apply 1 Application topically daily. 04/28/22   [provider]  metoprolol succinate (TOPROL-XL) 25 MG 24 hr tablet TAKE 1 TABLET BY MOUTH ONCE DAILY 02/09/21   Lyn Records, MD  pantoprazole (PROTONIX) 40 MG tablet TAKE 1 TABLET BY MOUTH  DAILY 01/20/21   Lyn Records, MD  primidone (MYSOLINE) 50 MG tablet Take 1.5 tablets (75 mg total) by mouth at  bedtime. 04/04/22   Glean Salvo, NP  rosuvastatin (CRESTOR) 20 MG tablet Take 1 tablet (20 mg total) by mouth daily. 05/09/22   Orbie Pyo, MD      Allergies    Patient has no known allergies.    Review of Systems   Review of Systems  Cardiovascular:  Positive for chest pain.  Review of systems completed and notable as per HPI.  ROS otherwise negative.   Physical Exam Updated Vital Signs BP (!) 161/87   Pulse 69   Temp 98.4 F (36.9 C)   Resp (!) 22   Ht 5\' 7"  (1.702 m)   Wt 78.9 kg   SpO2 100%   BMI 27.24 kg/m  Physical Exam Vitals and nursing note reviewed.  Constitutional:      General:  He is not in acute distress.    Appearance: He is well-developed.  HENT:     Head: Normocephalic and atraumatic.     Nose: Nose normal.     Mouth/Throat:     Mouth: Mucous membranes are moist.     Pharynx: Oropharynx is clear.  Eyes:     Extraocular Movements: Extraocular movements intact.     Conjunctiva/sclera: Conjunctivae normal.     Pupils: Pupils are equal, round, and reactive to light.  Cardiovascular:     Rate and Rhythm: Normal rate and regular rhythm.     Pulses: Normal pulses.     Heart sounds: Normal heart sounds. No murmur heard. Pulmonary:     Effort: Pulmonary effort is normal. No respiratory distress.     Breath sounds: Normal breath sounds.  Abdominal:     Palpations: Abdomen is soft.     Tenderness: There is no abdominal tenderness.  Musculoskeletal:        General: No swelling.     Cervical back: Neck supple.     Right lower leg: No edema.     Left lower leg: No edema.  Skin:    General: Skin is warm and dry.     Capillary Refill: Capillary refill takes less than 2 seconds.  Neurological:     General: No focal deficit present.     Mental Status: He is alert and oriented to person, place, and time. Mental status is at baseline.     Cranial Nerves: No cranial nerve deficit.     Sensory: No sensory deficit.     Motor: No weakness.     Coordination: Coordination normal.  Psychiatric:        Mood and Affect: Mood normal.     ED Results / Procedures / Treatments   Labs (all labs ordered are listed, but only abnormal results are displayed) Labs Reviewed  BASIC METABOLIC PANEL  CBC  TROPONIN I (HIGH SENSITIVITY)  TROPONIN I (HIGH SENSITIVITY)    EKG EKG Interpretation Date/Time:  Thursday April 19 2023 18:39:49 EST Ventricular Rate:  54 PR Interval:  174 QRS Duration:  96 QT Interval:  436 QTC Calculation: 413 R Axis:   54  Text Interpretation: Sinus bradycardia Cannot rule out Anterior infarct , age undetermined ST & T wave abnormality,  consider inferolateral ischemia Abnormal ECG When compared with ECG of 29-Apr-2019 06:43, PREVIOUS ECG IS PRESENT Significant artifact from tremor Confirmed by Fulton Reek [phone]) on 04/19/2023 7:40:59 PM  Radiology DG Chest 2 View Result Date: 04/19/2023 CLINICAL DATA:  Chest pain EXAM: CHEST - 2 VIEW COMPARISON:  07/19/2022 FINDINGS: Heart mediastinal contours within normal limits. No confluent airspace opacities, effusions  or edema. No acute bony abnormality. IMPRESSION: No active cardiopulmonary disease. Electronically Signed   By: Charlett Nose M.D.   On: 04/19/2023 19:30    Procedures Procedures    Medications Ordered in ED Medications - No data to display  ED Course/ Medical Decision Making/ A&P Clinical Course as of 04/20/23 0000  Thu Apr 19, 2023  2237 Patient pain-free, he wants to go home.  Troponin is negative x 2, pain was burning in nature and relieved with Tums which is atypical.  I did discuss with him that he is high risk given his history of CAD.  He would really like to go home.  We will have him follow close with cardiology, given strict return precautions. [JD]    Clinical Course User Index [JD] Laurence Spates, MD                                 Medical Decision Making Amount and/or Complexity of Data Reviewed Labs: ordered. Radiology: ordered.   Medical Decision Making:   DIONTAE ROUTE is a 78 y.o. male who presented to the ED today with chest pain.  Vital signs reviewed normal for mild hypertension.  Initial EKG had significant artifact from tremor.  Repeat EKG without acute ischemic changes.  No significant change from prior.  Pain was somewhat atypical, and is burning in sensation similar to GERD and improved with Tums.  However he does have significant cardiac history.  Will trend troponins.  Low suspicion for PE, dissection based on history and exam.  Patient placed on continuous vitals and telemetry monitoring while in ED which was reviewed  periodically.  Reviewed and confirmed nursing documentation for past medical history, family history, social history.  Reassessment and Plan:   Patient pain-free, he wants to go home.  Troponin is negative x 2, pain was burning in nature and relieved with Tums which is atypical.  I did discuss with him that he is high risk given his history of CAD.  He would really like to go home.  We will have him follow close with cardiology, given strict return precautions.  Patient's presentation is most consistent with acute complicated illness / injury requiring diagnostic workup.         Final Clinical Impression(s) / ED Diagnoses Final diagnoses:  Chest pain, unspecified type    Rx / DC Orders ED Discharge Orders          Ordered    Ambulatory referral to Cardiology       Comments: If you have not heard from the Cardiology office within the next 72 hours please call [phone].   04/19/23 2209              Laurence Spates, MD 04/20/23 0000

## 2023-04-19 NOTE — Discharge Instructions (Signed)
Your workup today was reassuring.  I placed a referral for cardiology, they should call you tomorrow but if you do not hear back by tomorrow afternoon you should call your cardiologist to schedule follow-up.  If you develop chest pain, shortness of breath, palpitations, fainting or any other new concerning symptoms you should return to the ED.

## 2023-04-20 ENCOUNTER — Ambulatory Visit (HOSPITAL_BASED_OUTPATIENT_CLINIC_OR_DEPARTMENT_OTHER): Payer: Medicare Other | Attending: Internal Medicine | Admitting: Physical Therapy

## 2023-04-20 ENCOUNTER — Encounter: Payer: Self-pay | Admitting: Dietician

## 2023-04-20 ENCOUNTER — Other Ambulatory Visit: Payer: Self-pay

## 2023-04-20 ENCOUNTER — Encounter (HOSPITAL_BASED_OUTPATIENT_CLINIC_OR_DEPARTMENT_OTHER): Payer: Medicare Other | Admitting: Dietician

## 2023-04-20 ENCOUNTER — Encounter (HOSPITAL_BASED_OUTPATIENT_CLINIC_OR_DEPARTMENT_OTHER): Payer: Self-pay | Admitting: Physical Therapy

## 2023-04-20 DIAGNOSIS — I2510 Atherosclerotic heart disease of native coronary artery without angina pectoris: Secondary | ICD-10-CM | POA: Diagnosis present

## 2023-04-20 DIAGNOSIS — M6281 Muscle weakness (generalized): Secondary | ICD-10-CM | POA: Diagnosis not present

## 2023-04-20 DIAGNOSIS — R278 Other lack of coordination: Secondary | ICD-10-CM | POA: Insufficient documentation

## 2023-04-20 DIAGNOSIS — R2689 Other abnormalities of gait and mobility: Secondary | ICD-10-CM | POA: Diagnosis not present

## 2023-04-20 DIAGNOSIS — R072 Precordial pain: Secondary | ICD-10-CM | POA: Diagnosis not present

## 2023-04-20 DIAGNOSIS — R7303 Prediabetes: Secondary | ICD-10-CM

## 2023-04-20 DIAGNOSIS — I2089 Other forms of angina pectoris: Secondary | ICD-10-CM | POA: Insufficient documentation

## 2023-04-20 NOTE — Progress Notes (Signed)
Patient was seen on 04/19/2022 for the Core Session 2 of Diabetes Prevention Program course at [facility]. By the end of this session patients are able to complete the following objectives:   Learning Objectives: Self-monitor their weight during the weeks following Session 2.  Describe the relationship between fat and calories.  Explain the reason for, and basic principles of, self-monitoring fat grams and calories.  Identify their personal fat gram goals.  Use the ?Fat and Calorie Counter? to calculate the calories and fat grams of a given selection of foods.  Keep a running total of the fat grams they eat each day.  Calculate fat, calories, and serving sizes from nutrition labels.   Goals:  Weigh yourself at the same time each day, or every few days, and record your weight in your Food and Activity Tracker. Write down everything you eat and drink in your Food and Activity Tracker. Measure portions as much as you can, and start reading labels.  Use the ?Fat and Calorie Counter? to figure out the amount of fat and calories in what you ate, and write the amount down in your Food and Activity Tracker. Keep a running fat gram total throughout the day. Come as close to your fat gram goal as you can.   Follow-Up Plan: Attend Core Session 3 next week.  Bring completed "Food and Activity Tracker" next week to be reviewed by Lifestyle Coach.

## 2023-04-23 ENCOUNTER — Encounter (HOSPITAL_BASED_OUTPATIENT_CLINIC_OR_DEPARTMENT_OTHER): Payer: Self-pay | Admitting: Physical Therapy

## 2023-04-23 ENCOUNTER — Ambulatory Visit (HOSPITAL_BASED_OUTPATIENT_CLINIC_OR_DEPARTMENT_OTHER): Payer: Medicare Other | Admitting: Physical Therapy

## 2023-04-23 DIAGNOSIS — R2689 Other abnormalities of gait and mobility: Secondary | ICD-10-CM | POA: Diagnosis not present

## 2023-04-23 DIAGNOSIS — R278 Other lack of coordination: Secondary | ICD-10-CM | POA: Diagnosis not present

## 2023-04-23 DIAGNOSIS — I2089 Other forms of angina pectoris: Secondary | ICD-10-CM | POA: Diagnosis not present

## 2023-04-23 DIAGNOSIS — R072 Precordial pain: Secondary | ICD-10-CM | POA: Diagnosis not present

## 2023-04-23 DIAGNOSIS — M6281 Muscle weakness (generalized): Secondary | ICD-10-CM

## 2023-04-23 NOTE — Therapy (Signed)
OUTPATIENT PHYSICAL THERAPY LOWER EXTREMITY TREATMENT   Patient Name: Cole Martinez MRN: [id number] DOB:1945/06/07, 78 y.o., male Today's Date: 04/23/2023  END OF SESSION:  PT End of Session - 04/23/23 1159     Visit Number 2    Number of Visits 12    Date for PT Re-Evaluation 06/01/23    Authorization Type MCR A and B    PT Start Time 1103    PT Stop Time 1150    PT Time Calculation (min) 47 min    Activity Tolerance Patient tolerated treatment well    Behavior During Therapy WFL for tasks assessed/performed              Past Medical History:  Diagnosis Date   ADHD (attention deficit hyperactivity disorder)    B12 deficiency    BPH (benign prostatic hypertrophy)    Chronic lymphocytic leukemia (CLL), T-cell (HCC) DX 1996--  ONCOLOGIST-  DR Truett Perna   PT IS ASYMPTOMATIC--- LAST CBC W/ DIFF 06-24-2012 STABLE   Coronary artery disease CARDIOLOGIST- DR Verdis Prime   S/P STENTING LAD 1999 // Myoview 01/2019: EF 62, normal perfusion; Low Risk   Crohn's disease of ileum (HCC) SINCE 1988   ED (erectile dysfunction)    Elevated PSA    Essential and other specified forms of tremor 11/25/2012   Gait abnormality 05/17/2020   H/O adenomatous polyp of colon    Hyperlipidemia    Hypertension    Nocturia    OA (osteoarthritis)    Peripheral neuropathy    hx of, none current as of 08-04-13   Peyronie disease    S/P coronary artery stent placement OCT 1999 OF LAD   Tremor, hereditary, benign MILD RIGHT HAND   Past Surgical History:  Procedure Laterality Date   CARDIOVASCULAR STRESS TEST  11-01-2010 DR Verdis Prime   NORMAL PERFUSION STUDY/ EF 64%/ NO ISCHEMIA   cataract surgery  Bilateral feburary 2020   with lens placement    COLONOSCOPY WITH PROPOFOL N/A 09/24/2012   Procedure: COLONOSCOPY WITH PROPOFOL;  Surgeon: Charolett Bumpers, MD;  Location: [facility];  Service: Endoscopy;  Laterality: N/A;   CORONARY ANGIOPLASTY WITH STENT PLACEMENT  OCT 1999   STENT OF LAD    CORONARY STENT INTERVENTION N/A 02/14/2019   Procedure: CORONARY STENT INTERVENTION;  Surgeon: Lyn Records, MD;  Location: [facility];  Service: Cardiovascular;  Laterality: N/A;   CYSTOSCOPY WITH URETHRAL DILATATION  03/12/2017   Procedure: CYSTOSCOPY WITH URETHRAL DILATATION;  Surgeon: Ollen Gross, MD;  Location: [facility];  Service: Orthopedics;;  [name], Resident Assisting   CYSTOSCOPY WITH URETHRAL DILATATION N/A 10/03/2018   Procedure: CYSTOSCOPY WITH URETHRAL BALLOON DILATATION WITH BILATERAL RETROGRADE PYELOGRAPHY;  Surgeon: Heloise Purpura, MD;  Location: [facility];  Service: Urology;  Laterality: N/A;   CYSTOSCOPY WITH URETHRAL DILATATION N/A 12/30/2020   Procedure: CYSTOSCOPY WITH BALLOON DILATATION OF URETHRAL STRICTURE;  Surgeon: Heloise Purpura, MD;  Location: [facility];  Service: Urology;  Laterality: N/A;   LAPAROSCOPIC INGUINAL HERNIA REPAIR Bilateral 01-10-2004   W/ MESH   LEFT HEART CATH AND CORONARY ANGIOGRAPHY N/A 02/13/2019   Procedure: LEFT HEART CATH AND CORONARY ANGIOGRAPHY;  Surgeon: Lyn Records, MD;  Location: [facility];  Service: Cardiovascular;  Laterality: N/A;   neck benign removed from neck  yrs ago   PROSTATE BIOPSY N/A 07/12/2012   Procedure: PROSTATE BIOPSY AND ULTRASOUND;  Surgeon: Kathi Ludwig, MD;  Location: [facility];  Service: Urology;  Laterality: N/A;   PROSTATE SURGERY  2002   tuna   REMOVAL LEFT NECK LYMPH NODE  1996   TONSILLECTOMY  CHILD   TOTAL KNEE ARTHROPLASTY Right 03/12/2017   Procedure: RIGHT TOTAL KNEE ARTHROPLASTY;  Surgeon: Ollen Gross, MD;  Location: [facility];  Service: Orthopedics;  Laterality: Right;  Adductor Block   TRANSURETHRAL RESECTION OF PROSTATE N/A 08/08/2013   Procedure: TRANSURETHRAL RESECTION OF THE PROSTATE WITH GYRUS INSTRUMENTS;  Surgeon: Kathi Ludwig, MD;  Location: [facility];  Service: Urology;  Laterality: N/A;   TRANSURETHRAL RESECTION OF PROSTATE     Patient Active Problem  List   Diagnosis Date Noted   Immunization counseling 12/20/2022   Cellulitis 12/20/2022   Medication management 12/20/2022   Adult attention deficit disorder 06/04/2020   Chronic lymphocytic leukemia (HCC) 06/04/2020   Conductive hearing loss, bilateral 06/04/2020   Crohn's ileitis (HCC) 06/04/2020   Disorder of musculoskeletal system 06/04/2020   Erectile dysfunction 06/04/2020   Gastroesophageal reflux disease 06/04/2020   Gout 06/04/2020   History of adenomatous polyp of colon 06/04/2020   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   Hypothyroidism 06/04/2020   Increased frequency of urination 06/04/2020   Male hypogonadism 06/04/2020   Muscle pain 06/04/2020   Nocturia 06/04/2020   Obesity 06/04/2020   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   Osteopenia 06/04/2020   Peripheral neurogenic pain 06/04/2020   Pure hypercholesterolemia 06/04/2020   Recurrent falls 06/04/2020   Unspecified kyphosis, thoracic region 06/04/2020   Vitamin B12 deficiency 06/04/2020   Vitamin D deficiency 06/04/2020   Gait abnormality 05/17/2020   Ataxia 03/16/2020   Angina pectoris (HCC) 02/13/2019   Pseudophakia of both eyes 08/01/2018   History of total knee arthroplasty 03/29/2017   Stiffness of right knee 03/16/2017   OA (osteoarthritis) of knee 03/12/2017   Tremor, essential 12/03/2015   Essential hypertension 12/09/2014   Coronary artery disease involving native heart 12/08/2013   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   Hyperlipidemia 12/08/2013   Benign prostatic hyperplasia 08/08/2013   Essential and other specified forms of tremor 11/25/2012    PCP: Emilio Aspen, MD  REFERRING PROVIDER: Emilio Aspen, MD  REFERRING DIAG: R26.89  Balance disorder              R26.81  Gait Instability    M62.81 lower extremity weakness  THERAPY DIAG:  Other abnormalities of gait and mobility  Other lack of coordination  Muscle weakness  (generalized)  Rationale for Evaluation and Treatment: Rehabilitation  ONSET DATE: PT order 03/20/2023  SUBJECTIVE:   SUBJECTIVE STATEMENT: Pt denies any adverse effects after prior Rx.  Pt states he rode the recumbent bike for 40 mins this AM.  "Sometimes my tremors are there and sometimes not."  Pt had difficulty with ambulation and is limited with distance.  He gets tired easily with walking.  Pt feels unsteady with walking and afraid of falling.  He has trouble lifting objects.  Pt has to use arms with sit/stand transfers sometimes and sometimes not.  He states his sx's are not consistent.     Pt performs the recumbent bike x 40 mins 2-3x/wk at the gym.  Pt does walk with his wife for 1/2 hour in the neighborhood.  Pt does chair yoga weekly.      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   PRECAUTIONS: Fall and Other: R TKA, Hx of CLL   WEIGHT BEARING RESTRICTIONS: No  FALLS:  Has patient fallen in last 6 months? Yes. Number of falls 1 when slipping on ice  LIVING ENVIRONMENT: Lives with: lives with their spouse Lives in: 2 story home with rails Stairs: yes Has following equipment at home:  cane, FWW  OCCUPATION: IT trainer, still works some  PLOF: Independent  PATIENT GOALS:  improve strength, feel more comfortable with walking, not feel that he is going to fall  OBJECTIVE:  Note: Objective measures were completed at Evaluation unless otherwise noted.  DIAGNOSTIC FINDINGS: N/A                                                                                                                                 TREATMENT DATE:   BERG Balance Test          Date:   Sit to Stand 4  Standing unsupported 4  Sitting with back unsupported but  feet supported 4  Stand to sit  4  Transfers  4  Standing unsupported with eyes closed 4  Standing unsupported feet together 4  From standing position, reach forward with outstretched arm 3  From standing position, pick up object from floor 4  From standing position, turn and look behind over each shoulder 4  Turn 360 2  Standing unsupported, alternately place foot on step 4  Standing unsupported, one foot in front 2  Standing on one leg 0  Total:  47/56   Pt performed: Seated clams with RTB  x15, GTB x 15 Standing marching 2x10 with UE support Standing heel raises with counter support 2x10 Standing toe raises with counter support 2x10 Sidestepping without UE support with SBA x 2 laps at rail  Pt received a HEP handout and was instructed in correct form and appropriate frequency.  PT instructed pt in holding on to counter for support with standing exercises.   PATIENT EDUCATION:  Education details: relevant anatomy,  Berg Balance test findings and rationale of score, exercise form, HEP, POC, dx, rationale of interventions, and what to expect next treatment. Person educated: Patient Education method: Explanation, demonstration, verbal and tactile cues, handout Education comprehension: verbalized understanding, returned demonstration, verbal and tactile cues required  HOME EXERCISE PROGRAM: Access Code: [email or web address] URL: [email or web address] Date: 04/23/2023 Prepared by: Aaron Edelman  Exercises - Seated Hip Abduction with Resistance  - 1 x daily - 3-4 x weekly - 2 sets - 10 reps - Standing March with Counter Support  - 1 x daily - 7 x weekly - 2 sets - 10 reps - Heel Raises with Counter Support  - 1 x daily - 6-7 x weekly - 2 sets - 10 reps - Narrow Stance with Counter Support  - 1 x daily - 7 x weekly - 2-3 reps - 30  seconds hold  ASSESSMENT:  CLINICAL IMPRESSION: Pt performed the Berg Balance Assessment and he scored > 45/56.  PT educated pt with rationale of  scoring.  Pt had deficits with reaching fwd, tandem stance, SLS, and turning.  PT established HEP and gave pt a handout.  Pt demonstrates good understanding of HEP.  Pt performed exercises well with cuing and instruction in correct form.  He responded well to Rx having no c/o's after Rx.  Pt should benefit from skilled PT services to improve balance, gait, strength, functional mobility and to reduce fall risk.            OBJECTIVE IMPAIRMENTS: Abnormal gait, decreased activity tolerance, decreased balance, decreased coordination, decreased endurance, decreased mobility, difficulty walking, decreased strength, and postural dysfunction.   ACTIVITY LIMITATIONS: carrying, lifting, standing, squatting, and locomotion level  PARTICIPATION LIMITATIONS: shopping and community activity  PERSONAL FACTORS: Time since onset of injury/illness/exacerbation and 3+ comorbidities: FXAS, R TKA, peripheral neuropathy, CLL  are also affecting patient's functional outcome.   REHAB POTENTIAL: Good  CLINICAL DECISION MAKING: Evolving/moderate complexity  EVALUATION COMPLEXITY: Moderate   GOALS:  SHORT TERM GOALS: Target date: 05/11/2023  Pt will be independent with HEP for improved balance, strength, and functional mobility. Baseline: Goal status: INITIAL  2.  Pt will demo improved stability with turning with gait.  Baseline:  Goal status: INITIAL  3.  Pt will perform TUG safely including turning and and performing stand to sit transfer with good control and without assistance.  Baseline:  Goal status: INITIAL  4.  Pt will be able to perform tandem stance for at least 20 seconds for improved balance and stability with daily activities.  Baseline:  Goal status: INITIAL Target date:  05/18/2023   LONG TERM GOALS: Target date: 06/01/2023   Pt will demo improved time on 5x STS test by 3 seconds and also improved form by not using his legs on the back of the chair for improved functional LE strength and  performance of transfers.  Baseline:  Goal status: INITIAL  2.  Pt will demo improved R hip flexion strength to 5/5 MMT and bilat hip abd strength by at least 6-8 lbs for improved strength to perform functional mobility with increased ease and less difficulty.  Baseline:  Goal status: INITIAL  3.  Will set Berg goal next visit after testing.  Baseline:  Goal status: INITIAL  4.  Pt will report at least a 70% improvement in stability with gait and community ambulation.  Baseline:  Goal status: INITIAL  5.  Pt will report improved confidence with his daily functional mobility.  Baseline:  Goal status: INITIAL     PLAN:  PT FREQUENCY: 2x/week  PT DURATION: 6 weeks  PLANNED INTERVENTIONS: 97164- PT Re-evaluation, 97110-Therapeutic exercises, 97530- Therapeutic activity, [id number]- Neuromuscular re-education, 97535- Self Care, 21308- Manual therapy, [phone]- Gait training, [phone]- Aquatic Therapy, Patient/Family education, Balance training, Stair training, and Taping  PLAN FOR NEXT SESSION: Cont with Balance and Gait training and strengthening.  Review and perform HEP.   Audie Clear III PT, DPT 04/23/23 3:18 PM

## 2023-04-24 ENCOUNTER — Other Ambulatory Visit: Payer: Self-pay | Admitting: Internal Medicine

## 2023-04-26 ENCOUNTER — Telehealth: Payer: Self-pay | Admitting: Internal Medicine

## 2023-04-26 ENCOUNTER — Encounter (HOSPITAL_BASED_OUTPATIENT_CLINIC_OR_DEPARTMENT_OTHER): Payer: Self-pay | Admitting: Physical Therapy

## 2023-04-26 ENCOUNTER — Ambulatory Visit (HOSPITAL_BASED_OUTPATIENT_CLINIC_OR_DEPARTMENT_OTHER): Payer: Self-pay | Admitting: Physical Therapy

## 2023-04-26 DIAGNOSIS — M6281 Muscle weakness (generalized): Secondary | ICD-10-CM | POA: Diagnosis not present

## 2023-04-26 DIAGNOSIS — R2689 Other abnormalities of gait and mobility: Secondary | ICD-10-CM

## 2023-04-26 DIAGNOSIS — R278 Other lack of coordination: Secondary | ICD-10-CM

## 2023-04-26 DIAGNOSIS — I2089 Other forms of angina pectoris: Secondary | ICD-10-CM | POA: Diagnosis not present

## 2023-04-26 DIAGNOSIS — R072 Precordial pain: Secondary | ICD-10-CM | POA: Diagnosis not present

## 2023-04-26 NOTE — Therapy (Signed)
OUTPATIENT PHYSICAL THERAPY LOWER EXTREMITY TREATMENT   Patient Name: Cole Martinez MRN: [id number] DOB:April 13, 1945, 78 y.o., male Today's Date: 04/26/2023  END OF SESSION:  PT End of Session - 04/26/23 0934     Visit Number 3    Number of Visits 12    Date for PT Re-Evaluation 06/01/23    Authorization Type MCR A and B    PT Start Time 0932    PT Stop Time 1013    PT Time Calculation (min) 41 min    Equipment Utilized During Treatment Gait belt    Activity Tolerance Patient tolerated treatment well    Behavior During Therapy WFL for tasks assessed/performed              Past Medical History:  Diagnosis Date   ADHD (attention deficit hyperactivity disorder)    B12 deficiency    BPH (benign prostatic hypertrophy)    Chronic lymphocytic leukemia (CLL), T-cell (HCC) DX 1996--  ONCOLOGIST-  DR Truett Perna   PT IS ASYMPTOMATIC--- LAST CBC W/ DIFF 06-24-2012 STABLE   Coronary artery disease CARDIOLOGIST- DR Verdis Prime   S/P STENTING LAD 1999 // Myoview 01/2019: EF 62, normal perfusion; Low Risk   Crohn's disease of ileum (HCC) SINCE 1988   ED (erectile dysfunction)    Elevated PSA    Essential and other specified forms of tremor 11/25/2012   Gait abnormality 05/17/2020   H/O adenomatous polyp of colon    Hyperlipidemia    Hypertension    Nocturia    OA (osteoarthritis)    Peripheral neuropathy    hx of, none current as of 08-04-13   Peyronie disease    S/P coronary artery stent placement OCT 1999 OF LAD   Tremor, hereditary, benign MILD RIGHT HAND   Past Surgical History:  Procedure Laterality Date   CARDIOVASCULAR STRESS TEST  11-01-2010 DR Verdis Prime   NORMAL PERFUSION STUDY/ EF 64%/ NO ISCHEMIA   cataract surgery  Bilateral feburary 2020   with lens placement    COLONOSCOPY WITH PROPOFOL N/A 09/24/2012   Procedure: COLONOSCOPY WITH PROPOFOL;  Surgeon: Charolett Bumpers, MD;  Location: [facility];  Service: Endoscopy;  Laterality: N/A;   CORONARY ANGIOPLASTY WITH  STENT PLACEMENT  OCT 1999   STENT OF LAD   CORONARY STENT INTERVENTION N/A 02/14/2019   Procedure: CORONARY STENT INTERVENTION;  Surgeon: Lyn Records, MD;  Location: [facility];  Service: Cardiovascular;  Laterality: N/A;   CYSTOSCOPY WITH URETHRAL DILATATION  03/12/2017   Procedure: CYSTOSCOPY WITH URETHRAL DILATATION;  Surgeon: Ollen Gross, MD;  Location: [facility];  Service: Orthopedics;;  [name], Resident Assisting   CYSTOSCOPY WITH URETHRAL DILATATION N/A 10/03/2018   Procedure: CYSTOSCOPY WITH URETHRAL BALLOON DILATATION WITH BILATERAL RETROGRADE PYELOGRAPHY;  Surgeon: Heloise Purpura, MD;  Location: [facility];  Service: Urology;  Laterality: N/A;   CYSTOSCOPY WITH URETHRAL DILATATION N/A 12/30/2020   Procedure: CYSTOSCOPY WITH BALLOON DILATATION OF URETHRAL STRICTURE;  Surgeon: Heloise Purpura, MD;  Location: [facility];  Service: Urology;  Laterality: N/A;   LAPAROSCOPIC INGUINAL HERNIA REPAIR Bilateral 01-10-2004   W/ MESH   LEFT HEART CATH AND CORONARY ANGIOGRAPHY N/A 02/13/2019   Procedure: LEFT HEART CATH AND CORONARY ANGIOGRAPHY;  Surgeon: Lyn Records, MD;  [facility]: [facility];  Service: Cardiovascular;  Laterality: N/A;   neck benign removed from neck  yrs ago   PROSTATE BIOPSY N/A 07/12/2012   Procedure: PROSTATE BIOPSY AND ULTRASOUND;  Surgeon: Kathi Ludwig, MD;  Location: [facility];  Service: Urology;  Laterality: N/A;   PROSTATE SURGERY  2002   tuna   REMOVAL LEFT NECK LYMPH NODE  1996   TONSILLECTOMY  CHILD   TOTAL KNEE ARTHROPLASTY Right 03/12/2017   Procedure: RIGHT TOTAL KNEE ARTHROPLASTY;  Surgeon: Ollen Gross, MD;  Location: [facility];  Service: Orthopedics;  Laterality: Right;  Adductor Block   TRANSURETHRAL RESECTION OF PROSTATE N/A 08/08/2013   Procedure: TRANSURETHRAL RESECTION OF THE PROSTATE WITH GYRUS INSTRUMENTS;  Surgeon: Kathi Ludwig, MD;  Location: [facility];  Service: Urology;  Laterality: N/A;   TRANSURETHRAL  RESECTION OF PROSTATE     Patient Active Problem List   Diagnosis Date Noted   Immunization counseling 12/20/2022   Cellulitis 12/20/2022   Medication management 12/20/2022   Adult attention deficit disorder 06/04/2020   Chronic lymphocytic leukemia (HCC) 06/04/2020   Conductive hearing loss, bilateral 06/04/2020   Crohn's ileitis (HCC) 06/04/2020   Disorder of musculoskeletal system 06/04/2020   Erectile dysfunction 06/04/2020   Gastroesophageal reflux disease 06/04/2020   Gout 06/04/2020   History of adenomatous polyp of colon 06/04/2020   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   Hypothyroidism 06/04/2020   Increased frequency of urination 06/04/2020   Male hypogonadism 06/04/2020   Muscle pain 06/04/2020   Nocturia 06/04/2020   Obesity 06/04/2020   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   Osteopenia 06/04/2020   Peripheral neurogenic pain 06/04/2020   Pure hypercholesterolemia 06/04/2020   Recurrent falls 06/04/2020   Unspecified kyphosis, thoracic region 06/04/2020   Vitamin B12 deficiency 06/04/2020   Vitamin D deficiency 06/04/2020   Gait abnormality 05/17/2020   Ataxia 03/16/2020   Angina pectoris (HCC) 02/13/2019   Pseudophakia of both eyes 08/01/2018   History of total knee arthroplasty 03/29/2017   Stiffness of right knee 03/16/2017   OA (osteoarthritis) of knee 03/12/2017   Tremor, essential 12/03/2015   Essential hypertension 12/09/2014   Coronary artery disease involving native heart 12/08/2013   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   Hyperlipidemia 12/08/2013   Benign prostatic hyperplasia 08/08/2013   Essential and other specified forms of tremor 11/25/2012    PCP: Emilio Aspen, MD  REFERRING PROVIDER: Emilio Aspen, MD  REFERRING DIAG: R26.89  Balance disorder              R26.81  Gait Instability    M62.81 lower extremity weakness  THERAPY DIAG:  Other abnormalities of gait and mobility  Other lack of  coordination  Muscle weakness (generalized)  Rationale for Evaluation and Treatment: Rehabilitation  ONSET DATE: PT order 03/20/2023  SUBJECTIVE:   SUBJECTIVE STATEMENT: Pt denies any adverse effects after prior Rx.  Pt states he has not performed his HEP.         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   PRECAUTIONS: Fall and Other: R TKA, Hx of CLL   WEIGHT BEARING RESTRICTIONS: No  FALLS:  Has patient fallen in last 6 months? Yes. Number of falls 1 when slipping on ice  LIVING ENVIRONMENT: Lives with: lives with their spouse Lives in: 2 story home with rails Stairs: yes Has following equipment at home:  cane, FWW  OCCUPATION: IT trainer, still works some  PLOF: Independent  PATIENT GOALS:  improve strength,  feel more comfortable with walking, not feel that he is going to fall  OBJECTIVE:  Note: Objective measures were completed at Evaluation unless otherwise noted.  DIAGNOSTIC FINDINGS: N/A                                                                                                                                 TREATMENT DATE:    Ther ex: Reviewed HEP.  Pt performed: Seated clams with GTB 2 x 10 Standing marching 2x10 with UE support Standing heel raises with counter support 2x15 Standing toe raises with counter support 2x10 Step ups 2x10 bilat with rail on 6 inch step with CGA Nustep L3-4 bilat UE/LE's x 5 mins  Neuro Re-ed: Standing on airex: FA 2x30 sec with SBA/CGA and with FT 2x30 sec each with CGA/min assist, staggered stance with min assist x20 sec each Tandem stance 2x30 sec each with UE support and min assist Sidestepping without UE support with SBA x 3 laps at rail  Updated HEP.  Pt received a HEP  handout and was instructed in correct form and appropriate frequency.  PT instructed pt in holding on to counter for support with exercise.   PATIENT EDUCATION:  Education details: relevant anatomy, exercise form, HEP, POC, dx, and rationale of interventions. Person educated: Patient Education method: Explanation, demonstration, verbal and tactile cues, handout Education comprehension: verbalized understanding, returned demonstration, verbal and tactile cues required  HOME EXERCISE PROGRAM: Access Code: [email or web address] URL: [email or web address] Date: 04/23/2023 Prepared by: Aaron Edelman  Exercises - Seated Hip Abduction with Resistance  - 1 x daily - 3-4 x weekly - 2 sets - 10 reps - Standing March with Counter Support  - 1 x daily - 7 x weekly - 2 sets - 10 reps - Heel Raises with Counter Support  - 1 x daily - 6-7 x weekly - 2 sets - 10 reps - Narrow Stance with Counter Support  - 1 x daily - 7 x weekly - 2-3 reps - 30 seconds hold  Updated HEP: - Toe Raises with Counter Support  - 1 x daily - 6-7 x weekly - 2 sets - 10 reps  ASSESSMENT:  CLINICAL IMPRESSION: Pt performed exercises to improve LE strength, balance, and functional mobility.  He gives great effort with all exercises.  PT reviewed HEP and updated HEP.  Pt performed ther ex and neuro re-ed activities well with instruction in correct form.  Pt required UE support with tandem balance though didn't require UE support with sidestepping.  PT used gait belt and provided appropriate assistance as needed with balance exercises.  He responded well to Rx having no c/o's after Rx.  Pt should benefit from continued skilled PT services to improve balance, gait, strength, functional mobility and to reduce fall risk.            OBJECTIVE IMPAIRMENTS: Abnormal gait, decreased activity tolerance, decreased balance, decreased coordination, decreased endurance, decreased mobility, difficulty walking,  decreased strength, and postural  dysfunction.   ACTIVITY LIMITATIONS: carrying, lifting, standing, squatting, and locomotion level  PARTICIPATION LIMITATIONS: shopping and community activity  PERSONAL FACTORS: Time since onset of injury/illness/exacerbation and 3+ comorbidities: FXAS, R TKA, peripheral neuropathy, CLL  are also affecting patient's functional outcome.   REHAB POTENTIAL: Good  CLINICAL DECISION MAKING: Evolving/moderate complexity  EVALUATION COMPLEXITY: Moderate   GOALS:  SHORT TERM GOALS: Target date: 05/11/2023  Pt will be independent with HEP for improved balance, strength, and functional mobility. Baseline: Goal status: INITIAL  2.  Pt will demo improved stability with turning with gait.  Baseline:  Goal status: INITIAL  3.  Pt will perform TUG safely including turning and and performing stand to sit transfer with good control and without assistance.  Baseline:  Goal status: INITIAL  4.  Pt will be able to perform tandem stance for at least 20 seconds for improved balance and stability with daily activities.  Baseline:  Goal status: INITIAL Target date:  05/18/2023   LONG TERM GOALS: Target date: 06/01/2023   Pt will demo improved time on 5x STS test by 3 seconds and also improved form by not using his legs on the back of the chair for improved functional LE strength and performance of transfers.  Baseline:  Goal status: INITIAL  2.  Pt will demo improved R hip flexion strength to 5/5 MMT and bilat hip abd strength by at least 6-8 lbs for improved strength to perform functional mobility with increased ease and less difficulty.  Baseline:  Goal status: INITIAL  3.  Will set Berg goal next visit after testing.  Baseline:  Goal status: INITIAL  4.  Pt will report at least a 70% improvement in stability with gait and community ambulation.  Baseline:  Goal status: INITIAL  5.  Pt will report improved confidence with his daily functional mobility.  Baseline:  Goal status:  INITIAL     PLAN:  PT FREQUENCY: 2x/week  PT DURATION: 6 weeks  PLANNED INTERVENTIONS: 97164- PT Re-evaluation, 97110-Therapeutic exercises, 97530- Therapeutic activity, [id number]- Neuromuscular re-education, 97535- Self Care, 09811- Manual therapy, [phone]- Gait training, [phone]- Aquatic Therapy, Patient/Family education, Balance training, Stair training, and Taping  PLAN FOR NEXT SESSION: Cont with Balance and Gait training and strengthening.  Review and perform HEP.   Audie Clear III PT, DPT 04/26/23 12:09 PM

## 2023-04-26 NOTE — Telephone Encounter (Signed)
Unable to leave patient message (mailbox full)  since he call today, a post hosp follow up appointment has been scheduled for tomorrow with APP.

## 2023-04-26 NOTE — Telephone Encounter (Signed)
Patient states information regarding recent ED admission was sent Dr. Lynnette Caffey last week and he hasn't heard back from anyone yet would like to know if MD has had an opportunity to review notes + provide recommendations.

## 2023-04-26 NOTE — Progress Notes (Signed)
 Cardiology Office Note:    Date:  04/27/2023  ID:  Wyline Copas, DOB Apr 01, 1945, MRN [id number] PCP: Emilio Aspen, MD  [facility] Providers Cardiologist:  Orbie Pyo, MD       Patient Profile:      Coronary artery disease  S/p BMS to LAD 1999 Myoview 10/18: no ischemia, EF 57 Myoview 01/24/2019: EF 62, normal perfusion, low risk LHC 02/13/2019: LM patent, LAD stent 70-80 ISR, Dx ostial 85 (jailed); LCx patent; RCA 90-95, EF 45-50 >> CABG vs PCI - pt opted for PCI S/p 2.75 x 15 mm DES to the RCA 02/14/2019 Relook LHC: LAD ISR 50-70>> med Rx TTE 04/07/2022: EF 55-60, no RWMA, mild LVH, GR 1 DD, normal RVSF, mild LAE, trivial MR, mild AI, AV sclerosis, aortic root 39 mm Hyperlipidemia  Hypertension  Obesity S/p R TKR 03/2017  Chronic Lymphocytic Leukemia  BPH  Crohn's Dz  Essential Tremor  AAA [place] 07/2011: no AAA Fragile X assoc tremor/ataxia syndrome (FXTAS)           Discussed the use of AI scribe software for clinical note transcription with the patient, who gave verbal consent to proceed. History of Present Illness Cole Martinez "Nadine Counts" is a 78 year old male with coronary artery disease who presents for post-emergency room follow-up for chest pain.  He experienced a burning sensation in his chest lasting at least 30 minutes without shortness of breath but with discomfort. Similar episodes have occurred in the past, including one requiring emergency services. This episode was not worse than previous ones. He did not use nitroglycerin or aspirin but took an antacid, which did not relieve the symptoms. The pain resolved by the time he reached the emergency room. In the emergency room on April 19, 2023, his troponin levels were negative x 2, and an EKG showed sinus bradycardia with nonspecific ST changes. A chest X-ray was unremarkable. Since the ER visit, he has not experienced further episodes of burning sensation or increased shortness of breath. He  continues to exercise regularly, using a recumbent bike for about 40 minutes, two to three times a week, without issues. He reports occasional swelling in his ankles, particularly noticeable when preparing for bed. He does not monitor his blood pressure at home.  ROS-See HPI    Studies Reviewed:        Results LABS Troponin: Negative (04/19/2023) K: 4 (04/19/2023) Cr: 0.97 (04/19/2023) Troponin: 5 (04/19/2023) Hb: 16.2 (04/19/2023) Total cholesterol: 112 (11/30/2022) HDL: 46 (11/30/2022) Triglycerides: 93 (11/30/2022) LDL: 48 (11/30/2022) LDL: 51 (12/2022)   Risk Assessment/Calculations:             Physical Exam:   VS:  BP 120/78   Pulse 93   Ht 5\' 6"  (1.676 m)   Wt 169 lb 6.4 oz (76.8 kg)   SpO2 92%   BMI 27.34 kg/m    Wt Readings from Last 3 Encounters:  04/27/23 169 lb 6.4 oz (76.8 kg)  04/19/23 173 lb 15.1 oz (78.9 kg)  12/20/22 174 lb (78.9 kg)    Constitutional:      Appearance: Healthy appearance. Not in distress.  Neck:     Vascular: No JVR.  Pulmonary:     Breath sounds: Normal breath sounds. No wheezing. No rales.  Cardiovascular:     Normal rate. Regular rhythm.     Murmurs: There is no murmur.  Edema:    Peripheral edema absent.  Abdominal:     Palpations: Abdomen is soft.  Assessment and Plan:   Assessment & Plan Coronary artery disease involving native coronary artery of native heart without angina pectoris Recent ER visit with chest discomfort lasting 30 minutes, negative troponins, and no acute EKG changes. History of bare metal stent to LAD in 1999 and DES to RCA in December 2020. Cardiac catheterization at that time showed 50-70% LAD stent in-stent restenosis. Echocardiogram in February 2024 showed EF 55-60, mild LDH, and mild diastolic dysfunction. - Arrange stress PET to assess for significant ischemia. - Continue Aspirin 81mg  daily, Metoprolol succinate 25mg  daily, and Crestor 20mg  daily. - Follow up in 6 months or sooner if  stress test is abnormal. Pure hypercholesterolemia LDL optimal in October. - Continue Crestor 20mg  daily and Zetia 10mg  daily. Essential hypertension Blood pressure well controlled. - Continue Metoprolol succinate 25mg  daily.   Informed Consent   Shared Decision Making/Informed Consent The risks [chest pain, shortness of breath, cardiac arrhythmias, dizziness, blood pressure fluctuations, myocardial infarction, stroke/transient ischemic attack, nausea, vomiting, allergic reaction, radiation exposure, metallic taste sensation and life-threatening complications (estimated to be 1 in 10,000)], benefits (risk stratification, diagnosing coronary artery disease, treatment guidance) and alternatives of a cardiac PET stress test were discussed in detail with Mr. Juncaj and he agrees to proceed.     Dispo:  Return in about 6 months (around 10/25/2023) for Routine Follow Up, w/ Dr. Lynnette Caffey.  Signed, Tereso Newcomer, PA-C

## 2023-04-27 ENCOUNTER — Ambulatory Visit (INDEPENDENT_AMBULATORY_CARE_PROVIDER_SITE_OTHER): Payer: Medicare Other | Admitting: Physician Assistant

## 2023-04-27 ENCOUNTER — Encounter: Payer: Self-pay | Admitting: Physician Assistant

## 2023-04-27 VITALS — BP 120/78 | HR 93 | Ht 66.0 in | Wt 169.4 lb

## 2023-04-27 DIAGNOSIS — I2510 Atherosclerotic heart disease of native coronary artery without angina pectoris: Secondary | ICD-10-CM | POA: Diagnosis not present

## 2023-04-27 DIAGNOSIS — R072 Precordial pain: Secondary | ICD-10-CM

## 2023-04-27 DIAGNOSIS — I10 Essential (primary) hypertension: Secondary | ICD-10-CM

## 2023-04-27 DIAGNOSIS — I2089 Other forms of angina pectoris: Secondary | ICD-10-CM | POA: Diagnosis not present

## 2023-04-27 DIAGNOSIS — R2689 Other abnormalities of gait and mobility: Secondary | ICD-10-CM | POA: Diagnosis not present

## 2023-04-27 DIAGNOSIS — E7800 Pure hypercholesterolemia, unspecified: Secondary | ICD-10-CM

## 2023-04-27 DIAGNOSIS — R278 Other lack of coordination: Secondary | ICD-10-CM | POA: Diagnosis not present

## 2023-04-27 DIAGNOSIS — M6281 Muscle weakness (generalized): Secondary | ICD-10-CM | POA: Diagnosis not present

## 2023-04-27 NOTE — Assessment & Plan Note (Signed)
Recent ER visit with chest discomfort lasting 30 minutes, negative troponins, and no acute EKG changes. History of bare metal stent to LAD in 1999 and DES to RCA in December 2020. Cardiac catheterization at that time showed 50-70% LAD stent in-stent restenosis. Echocardiogram in February 2024 showed EF 55-60, mild LDH, and mild diastolic dysfunction. - Arrange stress PET to assess for significant ischemia. - Continue Aspirin 81mg  daily, Metoprolol succinate 25mg  daily, and Crestor 20mg  daily. - Follow up in 6 months or sooner if stress test is abnormal.

## 2023-04-27 NOTE — Assessment & Plan Note (Signed)
LDL optimal in October. - Continue Crestor 20mg  daily and Zetia 10mg  daily.

## 2023-04-27 NOTE — Assessment & Plan Note (Signed)
Blood pressure well controlled. - Continue Metoprolol succinate 25mg  daily.

## 2023-04-27 NOTE — Patient Instructions (Signed)
Medication Instructions:  Your physician recommends that you continue on your current medications as directed. Please refer to the Current Medication list given to you today.  *If you need a refill on your cardiac medications before your next appointment, please call your pharmacy*   Lab Work: None ordered  If you have labs (blood work) drawn today and your tests are completely normal, you will receive your results only by: MyChart Message (if you have MyChart) OR A paper copy in the mail If you have any lab test that is abnormal or we need to change your treatment, we will call you to review the results.   Testing/Procedures:    Please report to Radiology at the [facility] Main Entrance 30 minutes early for your test.  [address] [place], [place] [address]                         OR   Please report to Radiology at [facility] Main Entrance, medical mall, 30 mins prior to your test.  [address]  [place], [place]  How to Prepare for Your Cardiac PET/CT Stress Test:  Nothing to eat or drink, except water, 3 hours prior to arrival time.  NO caffeine/decaffeinated products, or chocolate 12 hours prior to arrival. (Please note decaffeinated beverages (teas/coffees) still contain caffeine).  If you have caffeine within 12 hours prior, the test will need to be rescheduled.  Medication instructions: Do not take erectile dysfunction medications for 72 hours prior to test (sildenafil, tadalafil) Do not take nitrates (isosorbide mononitrate, Ranexa) the day before or day of test Do not take tamsulosin the day before or morning of test Hold theophylline containing medications for 12 hours. Hold Dipyridamole 48 hours prior to the test.  You may take your remaining medications with water.  NO perfume, cologne or lotion on chest or abdomen area.  Total time is 1 to 2 hours; you may want to bring reading material for the waiting time.  IF  YOU THINK YOU MAY BE PREGNANT, OR ARE NURSING PLEASE INFORM THE TECHNOLOGIST.  In preparation for your appointment, medication and supplies will be purchased.  Appointment availability is limited, so if you need to cancel or reschedule, please call the Radiology Department Scheduler at [phone] 24 hours in advance to avoid a cancellation fee of $100.00  What to Expect When you Arrive:  Once you arrive and check in for your appointment, you will be taken to a preparation room within the Radiology Department.  A technologist or Nurse will obtain your medical history, verify that you are correctly prepped for the exam, and explain the procedure.  Afterwards, an IV will be started in your arm and electrodes will be placed on your skin for EKG monitoring during the stress portion of the exam. Then you will be escorted to the PET/CT scanner.  There, staff will get you positioned on the scanner and obtain a blood pressure and EKG.  During the exam, you will continue to be connected to the EKG and blood pressure machines.  A small, safe amount of a radioactive tracer will be injected in your IV to obtain a series of pictures of your heart along with an injection of a stress agent.    After your Exam:  It is recommended that you eat a meal and drink a caffeinated beverage to counter act any effects of the stress agent.  Drink plenty of fluids for  the remainder of the day and urinate frequently for the first couple of hours after the exam.  Your doctor will inform you of your test results within 7-10 business days.  For more information and frequently asked questions, please visit our website: [email or web address]  For questions about your test or how to prepare for your test, please call: Cardiac Imaging Nurse Navigators Office: [phone]    Follow-Up: At [facility], you and your health needs are our priority.  As part of our continuing mission to provide you with  exceptional heart care, we have created designated Provider Care Teams.  These Care Teams include your primary Cardiologist (physician) and Advanced Practice Providers (APPs -  Physician Assistants and Nurse Practitioners) who all work together to provide you with the care you need, when you need it.  We recommend signing up for the patient portal called "MyChart".  Sign up information is provided on this After Visit Summary.  MyChart is used to connect with patients for Virtual Visits (Telemedicine).  Patients are able to view lab/test results, encounter notes, upcoming appointments, etc.  Non-urgent messages can be sent to your provider as well.   To learn more about what you can do with MyChart, go to [email or web address].    Your next appointment:   6 month(s)  Provider:   Orbie Pyo, MD     Other Instructions      1st Floor: - Lobby - Registration  - Pharmacy  - Lab - Cafe  2nd Floor: - PV Lab - Diagnostic Testing (echo, CT, nuclear med)  3rd Floor: - Vacant  4th Floor: - TCTS (cardiothoracic surgery) - AFib Clinic - Structural Heart Clinic - Vascular Surgery  - Vascular Ultrasound  5th Floor: - HeartCare Cardiology (general and EP) - Clinical Pharmacy for coumadin, hypertension, lipid, weight-loss medications, and med management appointments    Valet parking services will be available as well.

## 2023-04-30 DIAGNOSIS — J111 Influenza due to unidentified influenza virus with other respiratory manifestations: Secondary | ICD-10-CM | POA: Diagnosis not present

## 2023-04-30 DIAGNOSIS — J3489 Other specified disorders of nose and nasal sinuses: Secondary | ICD-10-CM | POA: Diagnosis not present

## 2023-04-30 DIAGNOSIS — R051 Acute cough: Secondary | ICD-10-CM | POA: Diagnosis not present

## 2023-04-30 DIAGNOSIS — Z03818 Encounter for observation for suspected exposure to other biological agents ruled out: Secondary | ICD-10-CM | POA: Diagnosis not present

## 2023-04-30 DIAGNOSIS — R5383 Other fatigue: Secondary | ICD-10-CM | POA: Diagnosis not present

## 2023-04-30 DIAGNOSIS — R52 Pain, unspecified: Secondary | ICD-10-CM | POA: Diagnosis not present

## 2023-04-30 DIAGNOSIS — J029 Acute pharyngitis, unspecified: Secondary | ICD-10-CM | POA: Diagnosis not present

## 2023-04-30 DIAGNOSIS — R350 Frequency of micturition: Secondary | ICD-10-CM | POA: Diagnosis not present

## 2023-05-01 ENCOUNTER — Encounter (HOSPITAL_BASED_OUTPATIENT_CLINIC_OR_DEPARTMENT_OTHER): Payer: Self-pay

## 2023-05-01 ENCOUNTER — Ambulatory Visit (HOSPITAL_BASED_OUTPATIENT_CLINIC_OR_DEPARTMENT_OTHER): Payer: Medicare Other | Admitting: Physical Therapy

## 2023-05-03 ENCOUNTER — Encounter (HOSPITAL_BASED_OUTPATIENT_CLINIC_OR_DEPARTMENT_OTHER): Payer: Self-pay | Admitting: Physical Therapy

## 2023-05-03 ENCOUNTER — Ambulatory Visit (HOSPITAL_BASED_OUTPATIENT_CLINIC_OR_DEPARTMENT_OTHER): Payer: Medicare Other | Admitting: Physical Therapy

## 2023-05-03 DIAGNOSIS — R278 Other lack of coordination: Secondary | ICD-10-CM

## 2023-05-03 DIAGNOSIS — R072 Precordial pain: Secondary | ICD-10-CM | POA: Diagnosis not present

## 2023-05-03 DIAGNOSIS — Z9181 History of falling: Secondary | ICD-10-CM

## 2023-05-03 DIAGNOSIS — R2681 Unsteadiness on feet: Secondary | ICD-10-CM

## 2023-05-03 DIAGNOSIS — R2689 Other abnormalities of gait and mobility: Secondary | ICD-10-CM | POA: Diagnosis not present

## 2023-05-03 DIAGNOSIS — M6281 Muscle weakness (generalized): Secondary | ICD-10-CM

## 2023-05-03 DIAGNOSIS — R293 Abnormal posture: Secondary | ICD-10-CM

## 2023-05-03 DIAGNOSIS — I2089 Other forms of angina pectoris: Secondary | ICD-10-CM | POA: Diagnosis not present

## 2023-05-03 NOTE — Therapy (Signed)
 OUTPATIENT PHYSICAL THERAPY LOWER EXTREMITY TREATMENT   Patient Name: Cole Martinez MRN: [id number] DOB:1945-12-21, 78 y.o., male Today's Date: 05/03/2023  END OF SESSION:  PT End of Session - 05/03/23 1114     Visit Number 4    Number of Visits 12    Date for PT Re-Evaluation 06/01/23    Authorization Type MCR A and B    PT Start Time 1106    PT Stop Time 1145    PT Time Calculation (min) 39 min    Equipment Utilized During Treatment Gait belt    Activity Tolerance Patient tolerated treatment well    Behavior During Therapy WFL for tasks assessed/performed               Past Medical History:  Diagnosis Date   ADHD (attention deficit hyperactivity disorder)    B12 deficiency    BPH (benign prostatic hypertrophy)    Chronic lymphocytic leukemia (CLL), T-cell (HCC) DX 1996--  ONCOLOGIST-  DR Truett Perna   PT IS ASYMPTOMATIC--- LAST CBC W/ DIFF 06-24-2012 STABLE   Coronary artery disease CARDIOLOGIST- DR Verdis Prime   S/P STENTING LAD 1999 // Myoview 01/2019: EF 62, normal perfusion; Low Risk   Crohn's disease of ileum (HCC) SINCE 1988   ED (erectile dysfunction)    Elevated PSA    Essential and other specified forms of tremor 11/25/2012   Gait abnormality 05/17/2020   H/O adenomatous polyp of colon    Hyperlipidemia    Hypertension    Nocturia    OA (osteoarthritis)    Peripheral neuropathy    hx of, none current as of 08-04-13   Peyronie disease    S/P coronary artery stent placement OCT 1999 OF LAD   Tremor, hereditary, benign MILD RIGHT HAND   Past Surgical History:  Procedure Laterality Date   CARDIOVASCULAR STRESS TEST  11-01-2010 DR Verdis Prime   NORMAL PERFUSION STUDY/ EF 64%/ NO ISCHEMIA   cataract surgery  Bilateral feburary 2020   with lens placement    COLONOSCOPY WITH PROPOFOL N/A 09/24/2012   Procedure: COLONOSCOPY WITH PROPOFOL;  Surgeon: Charolett Bumpers, MD;  Location: [facility];  Service: Endoscopy;  Laterality: N/A;   CORONARY ANGIOPLASTY  WITH STENT PLACEMENT  OCT 1999   STENT OF LAD   CORONARY STENT INTERVENTION N/A 02/14/2019   Procedure: CORONARY STENT INTERVENTION;  Surgeon: Lyn Records, MD;  Location: [facility];  Service: Cardiovascular;  Laterality: N/A;   CYSTOSCOPY WITH URETHRAL DILATATION  03/12/2017   Procedure: CYSTOSCOPY WITH URETHRAL DILATATION;  Surgeon: Ollen Gross, MD;  Location: [facility];  Service: Orthopedics;;  [name], Resident Assisting   CYSTOSCOPY WITH URETHRAL DILATATION N/A 10/03/2018   Procedure: CYSTOSCOPY WITH URETHRAL BALLOON DILATATION WITH BILATERAL RETROGRADE PYELOGRAPHY;  Surgeon: Heloise Purpura, MD;  Location: [facility];  Service: Urology;  Laterality: N/A;   CYSTOSCOPY WITH URETHRAL DILATATION N/A 12/30/2020   Procedure: CYSTOSCOPY WITH BALLOON DILATATION OF URETHRAL STRICTURE;  Surgeon: Heloise Purpura, MD;  Location: [facility];  Service: Urology;  Laterality: N/A;   LAPAROSCOPIC INGUINAL HERNIA REPAIR Bilateral 01-10-2004   W/ MESH   LEFT HEART CATH AND CORONARY ANGIOGRAPHY N/A 02/13/2019   Procedure: LEFT HEART CATH AND CORONARY ANGIOGRAPHY;  Surgeon: Lyn Records, MD;  Location: [facility];  Service: Cardiovascular;  Laterality: N/A;   neck benign removed from neck  yrs ago   PROSTATE BIOPSY N/A 07/12/2012   Procedure: PROSTATE BIOPSY AND ULTRASOUND;  Surgeon: Kathi Ludwig, MD;  Location: [facility];  Service: Urology;  Laterality: N/A;   PROSTATE SURGERY  2002   tuna   REMOVAL LEFT NECK LYMPH NODE  1996   TONSILLECTOMY  CHILD   TOTAL KNEE ARTHROPLASTY Right 03/12/2017   Procedure: RIGHT TOTAL KNEE ARTHROPLASTY;  Surgeon: Ollen Gross, MD;  Location: [facility];  Service: Orthopedics;  Laterality: Right;  Adductor Block   TRANSURETHRAL RESECTION OF PROSTATE N/A 08/08/2013   Procedure: TRANSURETHRAL RESECTION OF THE PROSTATE WITH GYRUS INSTRUMENTS;  Surgeon: Kathi Ludwig, MD;  Location: [facility];  Service: Urology;  Laterality: N/A;   TRANSURETHRAL  RESECTION OF PROSTATE     Patient Active Problem List   Diagnosis Date Noted   Immunization counseling 12/20/2022   Cellulitis 12/20/2022   Medication management 12/20/2022   Adult attention deficit disorder 06/04/2020   Chronic lymphocytic leukemia (HCC) 06/04/2020   Conductive hearing loss, bilateral 06/04/2020   Crohn's ileitis (HCC) 06/04/2020   Disorder of musculoskeletal system 06/04/2020   Erectile dysfunction 06/04/2020   Gastroesophageal reflux disease 06/04/2020   Gout 06/04/2020   History of adenomatous polyp of colon 06/04/2020   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   Hypothyroidism 06/04/2020   Increased frequency of urination 06/04/2020   Male hypogonadism 06/04/2020   Muscle pain 06/04/2020   Nocturia 06/04/2020   Obesity 06/04/2020   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   Osteopenia 06/04/2020   Peripheral neurogenic pain 06/04/2020   Recurrent falls 06/04/2020   Unspecified kyphosis, thoracic region 06/04/2020   Vitamin B12 deficiency 06/04/2020   Vitamin D deficiency 06/04/2020   Gait abnormality 05/17/2020   Ataxia 03/16/2020   Angina pectoris (HCC) 02/13/2019   Pseudophakia of both eyes 08/01/2018   History of total knee arthroplasty 03/29/2017   Stiffness of right knee 03/16/2017   OA (osteoarthritis) of knee 03/12/2017   Tremor, essential 12/03/2015   Essential hypertension 12/09/2014   CAD (coronary artery disease) 12/08/2013   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   Pure hypercholesterolemia 12/08/2013   Benign prostatic hyperplasia 08/08/2013   Essential and other specified forms of tremor 11/25/2012    PCP: Emilio Aspen, MD  REFERRING PROVIDER: Emilio Aspen, MD  REFERRING DIAG: R26.89  Balance disorder              R26.81  Gait Instability    M62.81 lower extremity weakness  THERAPY DIAG:  Other abnormalities of gait and mobility  Other lack of coordination  Muscle weakness  (generalized)  Abnormal posture  Unsteadiness on feet  History of falling  Rationale for Evaluation and Treatment: Rehabilitation  ONSET DATE: PT order 03/20/2023  SUBJECTIVE:   SUBJECTIVE STATEMENT: My wife had the flu, so I have not been active with my HEP. I would like to be challenged more today.    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   PRECAUTIONS: Fall and Other: R TKA, Hx of CLL   WEIGHT BEARING RESTRICTIONS: No  FALLS:  Has patient fallen in last 6 months? Yes. Number of falls 1 when slipping on ice  LIVING ENVIRONMENT: Lives with: lives with their spouse Lives in: 2 story home with rails Stairs: yes Has following equipment at home:  cane, FWW  OCCUPATION: IT trainer, still works some  PLOF: Independent  PATIENT GOALS:  improve strength, feel more comfortable with walking, not feel that he is going to fall  OBJECTIVE:  Note: Objective measures were completed at Evaluation unless otherwise noted.  DIAGNOSTIC FINDINGS: N/A                                                                                                                                 TREATMENT DATE:   02/27 Nustep L4-6 bilat UE/LE's x 5 mins Gait with cognitive challenged - naming animals and fruit/ veg with straight walking - looking in different directions - naming objects or observing different situations while walking  - head rotations, up/down, left/ right - walking backwards - pulling laundry basket (mimicking trash can)  - Half stairs with bilat hold on railing - marching on airex pad 1 min x2 - lifting weights 1 and 2 lbs to shelf height.  - squats  - stepping over hurdles forward x 4 laps   - lateral x6 laps    Ther ex: Reviewed  HEP.  Pt performed: Seated clams with GTB 2 x 10 Standing marching 2x10 with UE support Standing heel raises with counter support 2x15 Standing toe raises with counter support 2x10 Step ups 2x10 bilat with rail on 6 inch step with CGA Nustep L3-4 bilat UE/LE's x 5 mins  Neuro Re-ed: Standing on airex: FA 2x30 sec with SBA/CGA and with FT 2x30 sec each with CGA/min assist, staggered stance with min assist x20 sec each Tandem stance 2x30 sec each with UE support and min assist Sidestepping without UE support with SBA x 3 laps at rail  Updated HEP.  Pt received a HEP handout and was instructed in correct form and appropriate frequency.  PT instructed pt in holding on to counter for support with exercise.   PATIENT EDUCATION:  Education details: relevant anatomy, exercise form, HEP, POC, dx, and rationale of interventions. Person educated: Patient Education method: Explanation, demonstration, verbal and tactile cues, handout Education comprehension: verbalized understanding, returned demonstration, verbal and tactile cues required  HOME EXERCISE PROGRAM: Access Code: [email or web address] URL: [email or web address] Date: 04/23/2023 Prepared by: Aaron Edelman  Exercises - Seated Hip Abduction with Resistance  - 1 x daily - 3-4 x weekly - 2 sets - 10 reps - Standing March with Counter Support  - 1 x daily - 7 x weekly - 2 sets - 10 reps - Heel Raises with Counter Support  - 1 x daily - 6-7 x weekly - 2 sets - 10 reps - Narrow Stance with Counter Support  - 1 x daily - 7 x weekly - 2-3 reps - 30 seconds hold  Updated HEP: - Toe Raises with Counter Support  - 1 x daily - 6-7 x weekly - 2 sets - 10 reps  ASSESSMENT:  CLINICAL IMPRESSION: Session today focused on cognitive thinking and head rotations while walking to mimic walking in a busy environment. Walked while pulling laundry basket to mimic pulling  trash can. Pt did well with all gait exercises. He had a festinating gait with  backwards walking resulting in using his hand to lean on the wall to stop himself. He otherwise demonstrates good balance.  Challenged pt with hurdles today with increased difficulty with hip flexion clearance and hand held assist on bar. Pt did well with marches on airex with no LOB. Reaches overhead with weight to mimic unloading dishes with pt reporting no difficulty and increased weight next session.  Pt encouraged to alternate aquatic and land to help with LE strengthening. Pt should benefit from continued skilled PT services to improve balance, gait, strength, functional mobility and to reduce fall risk.            OBJECTIVE IMPAIRMENTS: Abnormal gait, decreased activity tolerance, decreased balance, decreased coordination, decreased endurance, decreased mobility, difficulty walking, decreased strength, and postural dysfunction.   ACTIVITY LIMITATIONS: carrying, lifting, standing, squatting, and locomotion level  PARTICIPATION LIMITATIONS: shopping and community activity  PERSONAL FACTORS: Time since onset of injury/illness/exacerbation and 3+ comorbidities: FXAS, R TKA, peripheral neuropathy, CLL  are also affecting patient's functional outcome.   REHAB POTENTIAL: Good  CLINICAL DECISION MAKING: Evolving/moderate complexity  EVALUATION COMPLEXITY: Moderate   GOALS:  SHORT TERM GOALS: Target date: 05/11/2023  Pt will be independent with HEP for improved balance, strength, and functional mobility. Baseline: Goal status: INITIAL  2.  Pt will demo improved stability with turning with gait.  Baseline:  Goal status: INITIAL  3.  Pt will perform TUG safely including turning and and performing stand to sit transfer with good control and without assistance.  Baseline:  Goal status: INITIAL  4.  Pt will be able to perform tandem stance for at least 20 seconds for improved balance and stability with daily activities.  Baseline:  Goal status: INITIAL Target date:  05/18/2023   LONG  TERM GOALS: Target date: 06/01/2023   Pt will demo improved time on 5x STS test by 3 seconds and also improved form by not using his legs on the back of the chair for improved functional LE strength and performance of transfers.  Baseline:  Goal status: INITIAL  2.  Pt will demo improved R hip flexion strength to 5/5 MMT and bilat hip abd strength by at least 6-8 lbs for improved strength to perform functional mobility with increased ease and less difficulty.  Baseline:  Goal status: INITIAL  3.  Will set Berg goal next visit after testing.  Baseline:  Goal status: INITIAL  4.  Pt will report at least a 70% improvement in stability with gait and community ambulation.  Baseline:  Goal status: INITIAL  5.  Pt will report improved confidence with his daily functional mobility.  Baseline:  Goal status: INITIAL     PLAN:  PT FREQUENCY: 2x/week  PT DURATION: 6 weeks  PLANNED INTERVENTIONS: [id number]- PT Re-evaluation, 97110-Therapeutic exercises, 97530- Therapeutic activity, [id number]- Neuromuscular re-education, 97535- Self Care, 96045- Manual therapy, [phone]- Gait training, [phone]- Aquatic Therapy, Patient/Family education, Balance training, Stair training, and Taping  PLAN FOR NEXT SESSION: Cont with Balance and Gait training and strengthening.  Review and perform HEP.   Royal Hawthorn PT, DPT 05/03/23  12:26 PM

## 2023-05-08 ENCOUNTER — Encounter (HOSPITAL_BASED_OUTPATIENT_CLINIC_OR_DEPARTMENT_OTHER): Payer: Self-pay | Admitting: Physical Therapy

## 2023-05-08 ENCOUNTER — Ambulatory Visit (HOSPITAL_BASED_OUTPATIENT_CLINIC_OR_DEPARTMENT_OTHER): Payer: Medicare Other | Attending: Internal Medicine | Admitting: Physical Therapy

## 2023-05-08 DIAGNOSIS — R278 Other lack of coordination: Secondary | ICD-10-CM | POA: Diagnosis not present

## 2023-05-08 DIAGNOSIS — R293 Abnormal posture: Secondary | ICD-10-CM | POA: Insufficient documentation

## 2023-05-08 DIAGNOSIS — M6281 Muscle weakness (generalized): Secondary | ICD-10-CM | POA: Insufficient documentation

## 2023-05-08 DIAGNOSIS — R2689 Other abnormalities of gait and mobility: Secondary | ICD-10-CM | POA: Insufficient documentation

## 2023-05-08 NOTE — Therapy (Signed)
 OUTPATIENT PHYSICAL THERAPY LOWER EXTREMITY TREATMENT   Patient Name: Cole Martinez MRN: [id number] DOB:08-Jun-1945, 78 y.o., male Today's Date: 05/08/2023  END OF SESSION:  PT End of Session - 05/08/23 1400     Visit Number 5    Number of Visits 12    Date for PT Re-Evaluation 06/01/23    Authorization Type MCR A and B    PT Start Time 1400    PT Stop Time 1440    PT Time Calculation (min) 40 min    Equipment Utilized During Treatment Gait belt    Activity Tolerance Patient tolerated treatment well    Behavior During Therapy WFL for tasks assessed/performed               Past Medical History:  Diagnosis Date   ADHD (attention deficit hyperactivity disorder)    B12 deficiency    BPH (benign prostatic hypertrophy)    Chronic lymphocytic leukemia (CLL), T-cell (HCC) DX 1996--  ONCOLOGIST-  DR Truett Perna   PT IS ASYMPTOMATIC--- LAST CBC W/ DIFF 06-24-2012 STABLE   Coronary artery disease CARDIOLOGIST- DR Verdis Prime   S/P STENTING LAD 1999 // Myoview 01/2019: EF 62, normal perfusion; Low Risk   Crohn's disease of ileum (HCC) SINCE 1988   ED (erectile dysfunction)    Elevated PSA    Essential and other specified forms of tremor 11/25/2012   Gait abnormality 05/17/2020   H/O adenomatous polyp of colon    Hyperlipidemia    Hypertension    Nocturia    OA (osteoarthritis)    Peripheral neuropathy    hx of, none current as of 08-04-13   Peyronie disease    S/P coronary artery stent placement OCT 1999 OF LAD   Tremor, hereditary, benign MILD RIGHT HAND   Past Surgical History:  Procedure Laterality Date   CARDIOVASCULAR STRESS TEST  11-01-2010 DR Verdis Prime   NORMAL PERFUSION STUDY/ EF 64%/ NO ISCHEMIA   cataract surgery  Bilateral feburary 2020   with lens placement    COLONOSCOPY WITH PROPOFOL N/A 09/24/2012   Procedure: COLONOSCOPY WITH PROPOFOL;  Surgeon: Charolett Bumpers, MD;  Location: [facility];  Service: Endoscopy;  Laterality: N/A;   CORONARY ANGIOPLASTY  WITH STENT PLACEMENT  OCT 1999   STENT OF LAD   CORONARY STENT INTERVENTION N/A 02/14/2019   Procedure: CORONARY STENT INTERVENTION;  Surgeon: Lyn Records, MD;  Location: [facility];  Service: Cardiovascular;  Laterality: N/A;   CYSTOSCOPY WITH URETHRAL DILATATION  03/12/2017   Procedure: CYSTOSCOPY WITH URETHRAL DILATATION;  Surgeon: Ollen Gross, MD;  Location: [facility];  Service: Orthopedics;;  [name], Resident Assisting   CYSTOSCOPY WITH URETHRAL DILATATION N/A 10/03/2018   Procedure: CYSTOSCOPY WITH URETHRAL BALLOON DILATATION WITH BILATERAL RETROGRADE PYELOGRAPHY;  Surgeon: Heloise Purpura, MD;  Location: [facility];  Service: Urology;  Laterality: N/A;   CYSTOSCOPY WITH URETHRAL DILATATION N/A 12/30/2020   Procedure: CYSTOSCOPY WITH BALLOON DILATATION OF URETHRAL STRICTURE;  Surgeon: Heloise Purpura, MD;  Location: [facility];  Service: Urology;  Laterality: N/A;   LAPAROSCOPIC INGUINAL HERNIA REPAIR Bilateral 01-10-2004   W/ MESH   LEFT HEART CATH AND CORONARY ANGIOGRAPHY N/A 02/13/2019   Procedure: LEFT HEART CATH AND CORONARY ANGIOGRAPHY;  Surgeon: Lyn Records, MD;  Location: [facility];  Service: Cardiovascular;  Laterality: N/A;   neck benign removed from neck  yrs ago   PROSTATE BIOPSY N/A 07/12/2012   Procedure: PROSTATE BIOPSY AND ULTRASOUND;  Surgeon: Kathi Ludwig, MD;  Location: [facility];  Service: Urology;  Laterality: N/A;   PROSTATE SURGERY  2002   tuna   REMOVAL LEFT NECK LYMPH NODE  1996   TONSILLECTOMY  CHILD   TOTAL KNEE ARTHROPLASTY Right 03/12/2017   Procedure: RIGHT TOTAL KNEE ARTHROPLASTY;  Surgeon: Ollen Gross, MD;  Location: [facility];  Service: Orthopedics;  Laterality: Right;  Adductor Block   TRANSURETHRAL RESECTION OF PROSTATE N/A 08/08/2013   Procedure: TRANSURETHRAL RESECTION OF THE PROSTATE WITH GYRUS INSTRUMENTS;  Surgeon: Kathi Ludwig, MD;  Location: [facility];  Service: Urology;  Laterality: N/A;   TRANSURETHRAL  RESECTION OF PROSTATE     Patient Active Problem List   Diagnosis Date Noted   Immunization counseling 12/20/2022   Cellulitis 12/20/2022   Medication management 12/20/2022   Adult attention deficit disorder 06/04/2020   Chronic lymphocytic leukemia (HCC) 06/04/2020   Conductive hearing loss, bilateral 06/04/2020   Crohn's ileitis (HCC) 06/04/2020   Disorder of musculoskeletal system 06/04/2020   Erectile dysfunction 06/04/2020   Gastroesophageal reflux disease 06/04/2020   Gout 06/04/2020   History of adenomatous polyp of colon 06/04/2020   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   Hypothyroidism 06/04/2020   Increased frequency of urination 06/04/2020   Male hypogonadism 06/04/2020   Muscle pain 06/04/2020   Nocturia 06/04/2020   Obesity 06/04/2020   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   Osteopenia 06/04/2020   Peripheral neurogenic pain 06/04/2020   Recurrent falls 06/04/2020   Unspecified kyphosis, thoracic region 06/04/2020   Vitamin B12 deficiency 06/04/2020   Vitamin D deficiency 06/04/2020   Gait abnormality 05/17/2020   Ataxia 03/16/2020   Angina pectoris (HCC) 02/13/2019   Pseudophakia of both eyes 08/01/2018   History of total knee arthroplasty 03/29/2017   Stiffness of right knee 03/16/2017   OA (osteoarthritis) of knee 03/12/2017   Tremor, essential 12/03/2015   Essential hypertension 12/09/2014   CAD (coronary artery disease) 12/08/2013   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   Pure hypercholesterolemia 12/08/2013   Benign prostatic hyperplasia 08/08/2013   Essential and other specified forms of tremor 11/25/2012    PCP: Emilio Aspen, MD  REFERRING PROVIDER: Emilio Aspen, MD  REFERRING DIAG: R26.89  Balance disorder              R26.81  Gait Instability    M62.81 lower extremity weakness  THERAPY DIAG:  Other abnormalities of gait and mobility  Other lack of coordination  Muscle weakness  (generalized)  Rationale for Evaluation and Treatment: Rehabilitation  ONSET DATE: PT order 03/20/2023  SUBJECTIVE:   SUBJECTIVE STATEMENT: "I am a little nervous don't know what to expect"    PERTINENT HISTORY: 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   PRECAUTIONS: Fall and Other: R TKA, Hx of CLL   WEIGHT BEARING RESTRICTIONS: No  FALLS:  Has patient fallen in last 6 months? Yes. Number of falls 1 when slipping on ice  LIVING ENVIRONMENT: Lives with: lives with their spouse Lives in: 2 story home with rails Stairs: yes Has following equipment at home:  cane, FWW  OCCUPATION: IT trainer, still works some  PLOF: Independent  PATIENT GOALS:  improve strength, feel more comfortable with walking, not feel that he is going to fall  OBJECTIVE:  Note: Objective  measures were completed at Evaluation unless otherwise noted.  DIAGNOSTIC FINDINGS: N/A                                                                                                                                 TREATMENT DATE:    [facility] Adult PT Treatment:                                                DATE: 05/08/23 Pt seen for aquatic therapy today.  Treatment took place in water 3.5-4.75 ft in depth at the [facility] pool. Temp of water was 91.  Pt entered/exited the pool via stairs with hand rail.  *intro to setting *walking forward, back and side stepping ue support barbel.  Cues for smaller BOS with side stepping *tandem stance leading R/L; FT ue support barbell;  *box stepping-> Pt calling out direction Farmers carry forward and backward bilateral yellow->RB HB *ue support yellow HB toe raises; heel raises *Farmers carry unilateral RBHB *1/2noodle pull down wide stance then  staggered x 8  Pt requires the buoyancy and hydrostatic pressure of water for support, and to offload joints by unweighting joint load by at least 50 % in navel deep water and by at least 75-80% in chest to neck deep water.  Viscosity of the water is needed for resistance of strengthening. Water current perturbations provides challenge to standing balance requiring increased core activation.    02/27 Nustep L4-6 bilat UE/LE's x 5 mins Gait with cognitive challenged - naming animals and fruit/ veg with straight walking - looking in different directions - naming objects or observing different situations while walking  - head rotations, up/down, left/ right - walking backwards - pulling laundry basket (mimicking trash can)  - Half stairs with bilat hold on railing - marching on airex pad 1 min x2 - lifting weights 1 and 2 lbs to shelf height.  - squats  - stepping over hurdles forward x 4 laps   - lateral x6 laps    Ther ex: Reviewed HEP.  Pt performed: Seated clams with GTB 2 x 10 Standing marching 2x10 with UE support Standing heel raises with counter support 2x15 Standing toe raises with counter support 2x10 Step ups 2x10 bilat with rail on 6 inch step with CGA Nustep L3-4 bilat UE/LE's x 5 mins  Neuro Re-ed: Standing on airex: FA 2x30 sec with SBA/CGA and with FT 2x30 sec each with CGA/min assist, staggered stance with min assist x20 sec each Tandem stance 2x30 sec each with UE support and min assist Sidestepping without UE support with SBA x 3 laps at rail  Updated HEP.  Pt received a HEP handout and was instructed in correct form and appropriate frequency.  PT instructed pt in holding on to counter for support with exercise.  PATIENT EDUCATION:  Education details: relevant anatomy, exercise form, HEP, POC, dx, and rationale of interventions. Person educated: Patient Education method: Explanation, demonstration, verbal and tactile cues, handout Education comprehension:  verbalized understanding, returned demonstration, verbal and tactile cues required  HOME EXERCISE PROGRAM: Access Code: [email or web address] URL: [email or web address] Date: 04/23/2023 Prepared by: Aaron Edelman  Exercises - Seated Hip Abduction with Resistance  - 1 x daily - 3-4 x weekly - 2 sets - 10 reps - Standing March with Counter Support  - 1 x daily - 7 x weekly - 2 sets - 10 reps - Heel Raises with Counter Support  - 1 x daily - 6-7 x weekly - 2 sets - 10 reps - Narrow Stance with Counter Support  - 1 x daily - 7 x weekly - 2-3 reps - 30 seconds hold  Updated HEP: - Toe Raises with Counter Support  - 1 x daily - 6-7 x weekly - 2 sets - 10 reps  ASSESSMENT:  CLINICAL IMPRESSION: Pt demonstrates safety and independence in aquatic setting with therapist instructing from deck. He does requires ue support initially as he is becoming acclimated.  Focus completely on balance in standing dynamic and static.  He has increased challenge with forward amb vs backward.  He has great attitude and enjoys session.  He is a good candidate for aquatic therapy and will benefit from the properties of water to progress towards land based goals.   Goals are ongoing.      OBJECTIVE IMPAIRMENTS: Abnormal gait, decreased activity tolerance, decreased balance, decreased coordination, decreased endurance, decreased mobility, difficulty walking, decreased strength, and postural dysfunction.   ACTIVITY LIMITATIONS: carrying, lifting, standing, squatting, and locomotion level  PARTICIPATION LIMITATIONS: shopping and community activity  PERSONAL FACTORS: Time since onset of injury/illness/exacerbation and 3+ comorbidities: FXAS, R TKA, peripheral neuropathy, CLL  are also affecting patient's functional outcome.   REHAB POTENTIAL: Good  CLINICAL DECISION MAKING: Evolving/moderate complexity  EVALUATION COMPLEXITY: Moderate   GOALS:  SHORT TERM GOALS: Target date: 05/11/2023  Pt will be  independent with HEP for improved balance, strength, and functional mobility. Baseline: Goal status: INITIAL  2.  Pt will demo improved stability with turning with gait.  Baseline:  Goal status: INITIAL  3.  Pt will perform TUG safely including turning and and performing stand to sit transfer with good control and without assistance.  Baseline:  Goal status: INITIAL  4.  Pt will be able to perform tandem stance for at least 20 seconds for improved balance and stability with daily activities.  Baseline:  Goal status: INITIAL Target date:  05/18/2023   LONG TERM GOALS: Target date: 06/01/2023   Pt will demo improved time on 5x STS test by 3 seconds and also improved form by not using his legs on the back of the chair for improved functional LE strength and performance of transfers.  Baseline:  Goal status: INITIAL  2.  Pt will demo improved R hip flexion strength to 5/5 MMT and bilat hip abd strength by at least 6-8 lbs for improved strength to perform functional mobility with increased ease and less difficulty.  Baseline:  Goal status: INITIAL  3.  Will set Berg goal next visit after testing.  Baseline:  Goal status: INITIAL  4.  Pt will report at least a 70% improvement in stability with gait and community ambulation.  Baseline:  Goal status: INITIAL  5.  Pt will report improved confidence with his daily functional mobility.  Baseline:  Goal status: INITIAL  PLAN:  PT FREQUENCY: 2x/week  PT DURATION: 6 weeks  PLANNED INTERVENTIONS: 97164- PT Re-evaluation, 97110-Therapeutic exercises, 97530- Therapeutic activity, [id number]- Neuromuscular re-education, 97535- Self Care, 95621- Manual therapy, [phone]- Gait training, [phone]- Aquatic Therapy, Patient/Family education, Balance training, Stair training, and Taping  PLAN FOR NEXT SESSION: Cont with Balance and Gait training and strengthening.  Review and perform HEP.   [address] [place]) Essie Gehret MPT 05/08/23 2:01 PM [facility] [address] [place], [place], [address] Phone: [phone]   Fax:  [phone]

## 2023-05-10 ENCOUNTER — Ambulatory Visit (HOSPITAL_BASED_OUTPATIENT_CLINIC_OR_DEPARTMENT_OTHER): Payer: Medicare Other | Admitting: Physical Therapy

## 2023-05-10 DIAGNOSIS — R278 Other lack of coordination: Secondary | ICD-10-CM | POA: Diagnosis not present

## 2023-05-10 DIAGNOSIS — R2689 Other abnormalities of gait and mobility: Secondary | ICD-10-CM | POA: Diagnosis not present

## 2023-05-10 DIAGNOSIS — R293 Abnormal posture: Secondary | ICD-10-CM | POA: Diagnosis not present

## 2023-05-10 DIAGNOSIS — M6281 Muscle weakness (generalized): Secondary | ICD-10-CM | POA: Diagnosis not present

## 2023-05-10 NOTE — Therapy (Signed)
 OUTPATIENT PHYSICAL THERAPY LOWER EXTREMITY TREATMENT   Patient Name: Cole Martinez MRN: [id number] DOB:[birth year], 78 y.o., male Today's Date: 05/11/2023  END OF SESSION:  PT End of Session - 05/10/23 1402     Visit Number 6    Number of Visits 12    Date for PT Re-Evaluation 06/01/23    Authorization Type MCR A and B    PT Start Time 1319    PT Stop Time 1400    PT Time Calculation (min) 41 min    Equipment Utilized During Treatment Gait belt    Activity Tolerance Patient tolerated treatment well    Behavior During Therapy WFL for tasks assessed/performed                Past Medical History:  Diagnosis Date   ADHD (attention deficit hyperactivity disorder)    B12 deficiency    BPH (benign prostatic hypertrophy)    Chronic lymphocytic leukemia (CLL), T-cell (HCC) DX 1996--  ONCOLOGIST-  DR Truett Perna   PT IS ASYMPTOMATIC--- LAST CBC W/ DIFF 06-24-2012 STABLE   Coronary artery disease CARDIOLOGIST- DR Verdis Prime   S/P STENTING LAD 1999 // Myoview 01/2019: EF 62, normal perfusion; Low Risk   Crohn's disease of ileum (HCC) SINCE 1988   ED (erectile dysfunction)    Elevated PSA    Essential and other specified forms of tremor 11/25/2012   Gait abnormality 05/17/2020   H/O adenomatous polyp of colon    Hyperlipidemia    Hypertension    Nocturia    OA (osteoarthritis)    Peripheral neuropathy    hx of, none current as of 08-04-13   Peyronie disease    S/P coronary artery stent placement OCT 1999 OF LAD   Tremor, hereditary, benign MILD RIGHT HAND   Past Surgical History:  Procedure Laterality Date   CARDIOVASCULAR STRESS TEST  11-01-2010 DR Verdis Prime   NORMAL PERFUSION STUDY/ EF 64%/ NO ISCHEMIA   cataract surgery  Bilateral feburary 2020   with lens placement    COLONOSCOPY WITH PROPOFOL N/A 09/24/2012   Procedure: COLONOSCOPY WITH PROPOFOL;  Surgeon: Charolett Bumpers, MD;  Location: [facility];  Service: Endoscopy;  Laterality: N/A;   CORONARY ANGIOPLASTY  WITH STENT PLACEMENT  OCT 1999   STENT OF LAD   CORONARY STENT INTERVENTION N/A 02/14/2019   Procedure: CORONARY STENT INTERVENTION;  Surgeon: Lyn Records, MD;  Location: [facility];  Service: Cardiovascular;  Laterality: N/A;   CYSTOSCOPY WITH URETHRAL DILATATION  03/12/2017   Procedure: CYSTOSCOPY WITH URETHRAL DILATATION;  Surgeon: Ollen Gross, MD;  Location: [facility];  Service: Orthopedics;;  [name], Resident Assisting   CYSTOSCOPY WITH URETHRAL DILATATION N/A 10/03/2018   Procedure: CYSTOSCOPY WITH URETHRAL BALLOON DILATATION WITH BILATERAL RETROGRADE PYELOGRAPHY;  Surgeon: Heloise Purpura, MD;  Location: [facility];  Service: Urology;  Laterality: N/A;   CYSTOSCOPY WITH URETHRAL DILATATION N/A 12/30/2020   Procedure: CYSTOSCOPY WITH BALLOON DILATATION OF URETHRAL STRICTURE;  Surgeon: Heloise Purpura, MD;  Location: [facility];  Service: Urology;  Laterality: N/A;   LAPAROSCOPIC INGUINAL HERNIA REPAIR Bilateral 01-10-2004   W/ MESH   LEFT HEART CATH AND CORONARY ANGIOGRAPHY N/A 02/13/2019   Procedure: LEFT HEART CATH AND CORONARY ANGIOGRAPHY;  Surgeon: Lyn Records, MD;  Location: [facility];  Service: Cardiovascular;  Laterality: N/A;   neck benign removed from neck  yrs ago   PROSTATE BIOPSY N/A 07/12/2012   Procedure: PROSTATE BIOPSY AND ULTRASOUND;  Surgeon: Kathi Ludwig,  MD;  Location: [facility];  Service: Urology;  Laterality: N/A;   PROSTATE SURGERY  2002   tuna   REMOVAL LEFT NECK LYMPH NODE  1996   TONSILLECTOMY  CHILD   TOTAL KNEE ARTHROPLASTY Right 03/12/2017   Procedure: RIGHT TOTAL KNEE ARTHROPLASTY;  Surgeon: Ollen Gross, MD;  Location: [facility];  Service: Orthopedics;  Laterality: Right;  Adductor Block   TRANSURETHRAL RESECTION OF PROSTATE N/A 08/08/2013   Procedure: TRANSURETHRAL RESECTION OF THE PROSTATE WITH GYRUS INSTRUMENTS;  Surgeon: Kathi Ludwig, MD;  Location: [facility];  Service: Urology;  Laterality: N/A;   TRANSURETHRAL  RESECTION OF PROSTATE     Patient Active Problem List   Diagnosis Date Noted   Immunization counseling 12/20/2022   Cellulitis 12/20/2022   Medication management 12/20/2022   Adult attention deficit disorder 06/04/2020   Chronic lymphocytic leukemia (HCC) 06/04/2020   Conductive hearing loss, bilateral 06/04/2020   Crohn's ileitis (HCC) 06/04/2020   Disorder of musculoskeletal system 06/04/2020   Erectile dysfunction 06/04/2020   Gastroesophageal reflux disease 06/04/2020   Gout 06/04/2020   History of adenomatous polyp of colon 06/04/2020   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   Hypothyroidism 06/04/2020   Increased frequency of urination 06/04/2020   Male hypogonadism 06/04/2020   Muscle pain 06/04/2020   Nocturia 06/04/2020   Obesity 06/04/2020   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   Osteopenia 06/04/2020   Peripheral neurogenic pain 06/04/2020   Recurrent falls 06/04/2020   Unspecified kyphosis, thoracic region 06/04/2020   Vitamin B12 deficiency 06/04/2020   Vitamin D deficiency 06/04/2020   Gait abnormality 05/17/2020   Ataxia 03/16/2020   Angina pectoris (HCC) 02/13/2019   Pseudophakia of both eyes 08/01/2018   History of total knee arthroplasty 03/29/2017   Stiffness of right knee 03/16/2017   OA (osteoarthritis) of knee 03/12/2017   Tremor, essential 12/03/2015   Essential hypertension 12/09/2014   CAD (coronary artery disease) 12/08/2013   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   Pure hypercholesterolemia 12/08/2013   Benign prostatic hyperplasia 08/08/2013   Essential and other specified forms of tremor 11/25/2012    PCP: Emilio Aspen, MD  REFERRING PROVIDER: Emilio Aspen, MD  REFERRING DIAG: R26.89  Balance disorder              R26.81  Gait Instability    M62.81 lower extremity weakness  THERAPY DIAG:  Other abnormalities of gait and mobility  Other lack of coordination  Muscle weakness  (generalized)  Rationale for Evaluation and Treatment: Rehabilitation  ONSET DATE: PT order 03/20/2023  SUBJECTIVE:   SUBJECTIVE STATEMENT: Pt denies any adverse effects after prior Rx though was sore after prior aquatic Rx.  Pt states he was ill last week and didn't perform his HEP.  Pt performed the recumbent bike for 40 mins on Monday and Wednesday.    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   PRECAUTIONS: Fall and Other: R TKA, Hx of CLL   WEIGHT BEARING RESTRICTIONS: No  FALLS:  Has patient fallen in last 6 months? Yes. Number of falls 1 when slipping on ice  LIVING ENVIRONMENT: Lives with: lives with their spouse Lives in: 2 story home with rails Stairs: yes Has following equipment at home:  cane, FWW  OCCUPATION: IT trainer, still works some  PLOF: Independent  PATIENT GOALS:  improve strength, feel more comfortable with walking, not feel that he is going to fall  OBJECTIVE:  Note: Objective measures were completed at Evaluation unless otherwise noted.  DIAGNOSTIC FINDINGS: N/A                                                                                                                                 TREATMENT DATE:    05/10/23 Nustep L5-6 bilat UE/LE's x 5 mins Seated hip clams with GTB 2x15  Marching on airex 2 x 1 min Standing on airex with FT 2x30 sec Semi-tandem stance 2x30 sec each Staggered standing on airex 2x30 sec each Sidestepping x 3 laps at rail without UE support Stepping over hurdles (4) with rail assist with a step to gait pattern with CGA/min assist Lateral stepping over hurdles (4) with rail assist with CGA     [facility] Adult PT Treatment:                                                DATE:  05/08/23 Pt seen for aquatic therapy today.  Treatment took place in water 3.5-4.75 ft in depth at the [facility] pool. Temp of water was 91.  Pt entered/exited the pool via stairs with hand rail.  *intro to setting *walking forward, back and side stepping ue support barbel.  Cues for smaller BOS with side stepping *tandem stance leading R/L; FT ue support barbell;  *box stepping-> Pt calling out direction Farmers carry forward and backward bilateral yellow->RB HB *ue support yellow HB toe raises; heel raises *Farmers carry unilateral RBHB *1/2noodle pull down wide stance then staggered x 8  Pt requires the buoyancy and hydrostatic pressure of water for support, and to offload joints by unweighting joint load by at least 50 % in navel deep water and by at least 75-80% in chest to neck deep water.  Viscosity of the water is needed for resistance of strengthening. Water current perturbations provides challenge to standing balance requiring increased core activation.    02/27 Nustep L4-6 bilat UE/LE's x 5 mins Gait with cognitive challenged - naming animals and fruit/ veg with straight walking - looking in different directions - naming objects or observing different situations while walking  - head rotations, up/down, left/ right - walking backwards - pulling laundry basket (mimicking trash can)  - Half stairs with bilat hold on railing - marching on airex pad 1 min x2 - lifting weights 1 and 2 lbs to shelf height.  - squats  - stepping over hurdles forward x 4 laps   - lateral x6 laps    Ther ex: Reviewed HEP.  Pt performed: Seated clams with GTB 2 x 10 Standing marching 2x10 with UE support Standing heel  raises with counter support 2x15 Standing toe raises with counter support 2x10 Step ups 2x10 bilat with rail on 6 inch step with CGA Nustep L3-4 bilat UE/LE's x 5 mins  Neuro Re-ed: Standing on airex: FA 2x30 sec with SBA/CGA and with FT 2x30 sec each with CGA/min  assist, staggered stance with min assist x20 sec each Tandem stance 2x30 sec each with UE support and min assist Sidestepping without UE support with SBA x 3 laps at rail  Updated HEP.  Pt received a HEP handout and was instructed in correct form and appropriate frequency.  PT instructed pt in holding on to counter for support with exercise.   PATIENT EDUCATION:  Education details: relevant anatomy, exercise form, HEP, POC, dx, and rationale of interventions. Person educated: Patient Education method: Explanation, demonstration, verbal and tactile cues, handout Education comprehension: verbalized understanding, returned demonstration, verbal and tactile cues required  HOME EXERCISE PROGRAM: Access Code: [email or web address] URL: [email or web address] Date: 04/23/2023 Prepared by: Aaron Edelman  Exercises - Seated Hip Abduction with Resistance  - 1 x daily - 3-4 x weekly - 2 sets - 10 reps - Standing March with Counter Support  - 1 x daily - 7 x weekly - 2 sets - 10 reps - Heel Raises with Counter Support  - 1 x daily - 6-7 x weekly - 2 sets - 10 reps - Narrow Stance with Counter Support  - 1 x daily - 7 x weekly - 2-3 reps - 30 seconds hold  Updated HEP: - Toe Raises with Counter Support  - 1 x daily - 6-7 x weekly - 2 sets - 10 reps  ASSESSMENT:  CLINICAL IMPRESSION: 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  Pt gives great effort with all exercises and performed ther ex and neuro re-ed activities well.  Pt is challenged with stepping over hurdles and requires the rail for UE assist.  Pt performed a step to gait pattern with fwd stepping over hurdles and would occasionally not clear the hurdle.  He responded well to Rx and was fatigued after Rx.  He should benefit from cont skilled PT to improve balance, strength, and functional mobility.      OBJECTIVE IMPAIRMENTS: Abnormal gait, decreased activity tolerance, decreased balance, decreased coordination, decreased endurance, decreased  mobility, difficulty walking, decreased strength, and postural dysfunction.   ACTIVITY LIMITATIONS: carrying, lifting, standing, squatting, and locomotion level  PARTICIPATION LIMITATIONS: shopping and community activity  PERSONAL FACTORS: Time since onset of injury/illness/exacerbation and 3+ comorbidities: FXAS, R TKA, peripheral neuropathy, CLL  are also affecting patient's functional outcome.   REHAB POTENTIAL: Good  CLINICAL DECISION MAKING: Evolving/moderate complexity  EVALUATION COMPLEXITY: Moderate   GOALS:  SHORT TERM GOALS: Target date: 05/11/2023  Pt will be independent with HEP for improved balance, strength, and functional mobility. Baseline: Goal status: INITIAL  2.  Pt will demo improved stability with turning with gait.  Baseline:  Goal status: INITIAL  3.  Pt will perform TUG safely including turning and and performing stand to sit transfer with good control and without assistance.  Baseline:  Goal status: INITIAL  4.  Pt will be able to perform tandem stance for at least 20 seconds for improved balance and stability with daily activities.  Baseline:  Goal status: INITIAL Target date:  05/18/2023   LONG TERM GOALS: Target date: 06/01/2023   Pt will demo improved time on 5x STS test by 3 seconds and also improved form by not using his legs on the back of the chair  for improved functional LE strength and performance of transfers.  Baseline:  Goal status: INITIAL  2.  Pt will demo improved R hip flexion strength to 5/5 MMT and bilat hip abd strength by at least 6-8 lbs for improved strength to perform functional mobility with increased ease and less difficulty.  Baseline:  Goal status: INITIAL  3.  Will set Berg goal next visit after testing.  Baseline:  Goal status: INITIAL  4.  Pt will report at least a 70% improvement in stability with gait and community ambulation.  Baseline:  Goal status: INITIAL  5.  Pt will report improved confidence with his  daily functional mobility.  Baseline:  Goal status: INITIAL     PLAN:  PT FREQUENCY: 2x/week  PT DURATION: 6 weeks  PLANNED INTERVENTIONS: 97164- PT Re-evaluation, 97110-Therapeutic exercises, 97530- Therapeutic activity, [id number]- Neuromuscular re-education, 97535- Self Care, 16109- Manual therapy, [phone]- Gait training, [phone]- Aquatic Therapy, Patient/Family education, Balance training, Stair training, and Taping  PLAN FOR NEXT SESSION: Cont with Balance and Gait training and strengthening.    Audie Clear III PT, DPT 05/11/23 2:47 PM

## 2023-05-11 ENCOUNTER — Encounter (HOSPITAL_BASED_OUTPATIENT_CLINIC_OR_DEPARTMENT_OTHER): Payer: Self-pay | Admitting: Physical Therapy

## 2023-05-11 ENCOUNTER — Encounter: Payer: Medicare Other | Attending: Internal Medicine | Admitting: Dietician

## 2023-05-11 ENCOUNTER — Encounter: Payer: Self-pay | Admitting: Dietician

## 2023-05-11 DIAGNOSIS — R7303 Prediabetes: Secondary | ICD-10-CM | POA: Insufficient documentation

## 2023-05-11 NOTE — Progress Notes (Signed)
 Patient was seen on 05/11/2023 for the Core Session 4 of Diabetes Prevention Program course at [facility]. By the end of this session patients are able to complete the following objectives:   Learning Objectives: Explain the health benefits of eating less fat and fewer calories. Describe the MyPlate food guide and its recommendations, including how to reduce fat and calories in our diet. Compare and contrast MyPlate guidelines with participants' eating habits. List ways to replace high-fat and high-calorie foods with low-fat and low-calorie foods. Explain the importance of eating plenty of whole grains, vegetables, and fruits, while staying within fat gram goals. Explain the importance of eating foods from all groups of MyPlate and of eating a variety of foods from within each group. Explain why a balanced diet is beneficial to health. Explain why eating the same foods over and over is not the best strategy for long-term success.   Goals:  Record weight taken outside of class.  Track foods and beverages eaten each day in the "Food and Activity Tracker," including calories and fat grams for each item.  Practice comparing what you eat with the recommendations of MyPlate using the "Rate Your Plate" handout.  Complete the "Rate Your Plate" handout form on at least 3 days.  Answer homework questions.   Follow-Up Plan: Attend Core Session 5 next week.  Bring completed "Food and Activity Tracker" next week to be reviewed by Lifestyle Coach.

## 2023-05-14 ENCOUNTER — Ambulatory Visit (HOSPITAL_BASED_OUTPATIENT_CLINIC_OR_DEPARTMENT_OTHER): Payer: Medicare Other | Admitting: Physical Therapy

## 2023-05-14 DIAGNOSIS — I2510 Atherosclerotic heart disease of native coronary artery without angina pectoris: Secondary | ICD-10-CM | POA: Diagnosis not present

## 2023-05-15 ENCOUNTER — Encounter (HOSPITAL_BASED_OUTPATIENT_CLINIC_OR_DEPARTMENT_OTHER): Payer: Medicare Other | Admitting: Physical Therapy

## 2023-05-16 ENCOUNTER — Ambulatory Visit (HOSPITAL_BASED_OUTPATIENT_CLINIC_OR_DEPARTMENT_OTHER): Payer: Self-pay | Admitting: Physical Therapy

## 2023-05-16 ENCOUNTER — Encounter (HOSPITAL_BASED_OUTPATIENT_CLINIC_OR_DEPARTMENT_OTHER): Payer: Self-pay | Admitting: Physical Therapy

## 2023-05-16 DIAGNOSIS — M6281 Muscle weakness (generalized): Secondary | ICD-10-CM

## 2023-05-16 DIAGNOSIS — R278 Other lack of coordination: Secondary | ICD-10-CM | POA: Diagnosis not present

## 2023-05-16 DIAGNOSIS — R2689 Other abnormalities of gait and mobility: Secondary | ICD-10-CM | POA: Diagnosis not present

## 2023-05-16 DIAGNOSIS — R293 Abnormal posture: Secondary | ICD-10-CM | POA: Diagnosis not present

## 2023-05-16 NOTE — Therapy (Signed)
 OUTPATIENT PHYSICAL THERAPY LOWER EXTREMITY TREATMENT   Patient Name: Cole Martinez MRN: [id number] DOB:1945-11-28, 78 y.o., male Today's Date: 05/16/2023  END OF SESSION:  PT End of Session - 05/16/23 0803     Visit Number 7    Number of Visits 12    Date for PT Re-Evaluation 06/01/23    Authorization Type MCR A and B    PT Start Time 0800    PT Stop Time 0840    PT Time Calculation (min) 40 min    Equipment Utilized During Treatment Gait belt    Activity Tolerance Patient tolerated treatment well    Behavior During Therapy WFL for tasks assessed/performed                Past Medical History:  Diagnosis Date   ADHD (attention deficit hyperactivity disorder)    B12 deficiency    BPH (benign prostatic hypertrophy)    Chronic lymphocytic leukemia (CLL), T-cell (HCC) DX 1996--  ONCOLOGIST-  DR Truett Perna   PT IS ASYMPTOMATIC--- LAST CBC W/ DIFF 06-24-2012 STABLE   Coronary artery disease CARDIOLOGIST- DR Verdis Prime   S/P STENTING LAD 1999 // Myoview 01/2019: EF 62, normal perfusion; Low Risk   Crohn's disease of ileum (HCC) SINCE 1988   ED (erectile dysfunction)    Elevated PSA    Essential and other specified forms of tremor 11/25/2012   Gait abnormality 05/17/2020   H/O adenomatous polyp of colon    Hyperlipidemia    Hypertension    Nocturia    OA (osteoarthritis)    Peripheral neuropathy    hx of, none current as of 08-04-13   Peyronie disease    S/P coronary artery stent placement OCT 1999 OF LAD   Tremor, hereditary, benign MILD RIGHT HAND   Past Surgical History:  Procedure Laterality Date   CARDIOVASCULAR STRESS TEST  11-01-2010 DR Verdis Prime   NORMAL PERFUSION STUDY/ EF 64%/ NO ISCHEMIA   cataract surgery  Bilateral feburary 2020   with lens placement    COLONOSCOPY WITH PROPOFOL N/A 09/24/2012   Procedure: COLONOSCOPY WITH PROPOFOL;  Surgeon: Charolett Bumpers, MD;  Location: [facility];  Service: Endoscopy;  Laterality: N/A;   CORONARY ANGIOPLASTY  WITH STENT PLACEMENT  OCT 1999   STENT OF LAD   CORONARY STENT INTERVENTION N/A 02/14/2019   Procedure: CORONARY STENT INTERVENTION;  Surgeon: Lyn Records, MD;  Location: [facility];  Service: Cardiovascular;  Laterality: N/A;   CYSTOSCOPY WITH URETHRAL DILATATION  03/12/2017   Procedure: CYSTOSCOPY WITH URETHRAL DILATATION;  Surgeon: Ollen Gross, MD;  Location: [facility];  Service: Orthopedics;;  [name], Resident Assisting   CYSTOSCOPY WITH URETHRAL DILATATION N/A 10/03/2018   Procedure: CYSTOSCOPY WITH URETHRAL BALLOON DILATATION WITH BILATERAL RETROGRADE PYELOGRAPHY;  Surgeon: Heloise Purpura, MD;  Location: [facility];  Service: Urology;  Laterality: N/A;   CYSTOSCOPY WITH URETHRAL DILATATION N/A 12/30/2020   Procedure: CYSTOSCOPY WITH BALLOON DILATATION OF URETHRAL STRICTURE;  Surgeon: Heloise Purpura, MD;  Location: [facility];  Service: Urology;  Laterality: N/A;   LAPAROSCOPIC INGUINAL HERNIA REPAIR Bilateral 01-10-2004   W/ MESH   LEFT HEART CATH AND CORONARY ANGIOGRAPHY N/A 02/13/2019   Procedure: LEFT HEART CATH AND CORONARY ANGIOGRAPHY;  Surgeon: Lyn Records, MD;  Location: [facility];  Service: Cardiovascular;  Laterality: N/A;   neck benign removed from neck  yrs ago   PROSTATE BIOPSY N/A 07/12/2012   Procedure: PROSTATE BIOPSY AND ULTRASOUND;  Surgeon: Kathi Ludwig,  MD;  Location: [facility];  Service: Urology;  Laterality: N/A;   PROSTATE SURGERY  2002   tuna   REMOVAL LEFT NECK LYMPH NODE  1996   TONSILLECTOMY  CHILD   TOTAL KNEE ARTHROPLASTY Right 03/12/2017   Procedure: RIGHT TOTAL KNEE ARTHROPLASTY;  Surgeon: Ollen Gross, MD;  Location: [facility];  Service: Orthopedics;  Laterality: Right;  Adductor Block   TRANSURETHRAL RESECTION OF PROSTATE N/A 08/08/2013   Procedure: TRANSURETHRAL RESECTION OF THE PROSTATE WITH GYRUS INSTRUMENTS;  Surgeon: Kathi Ludwig, MD;  Location: [facility];  Service: Urology;  Laterality: N/A;   TRANSURETHRAL  RESECTION OF PROSTATE     Patient Active Problem List   Diagnosis Date Noted   Immunization counseling 12/20/2022   Cellulitis 12/20/2022   Medication management 12/20/2022   Adult attention deficit disorder 06/04/2020   Chronic lymphocytic leukemia (HCC) 06/04/2020   Conductive hearing loss, bilateral 06/04/2020   Crohn's ileitis (HCC) 06/04/2020   Disorder of musculoskeletal system 06/04/2020   Erectile dysfunction 06/04/2020   Gastroesophageal reflux disease 06/04/2020   Gout 06/04/2020   History of adenomatous polyp of colon 06/04/2020   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   Hypothyroidism 06/04/2020   Increased frequency of urination 06/04/2020   Male hypogonadism 06/04/2020   Muscle pain 06/04/2020   Nocturia 06/04/2020   Obesity 06/04/2020   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   Osteopenia 06/04/2020   Peripheral neurogenic pain 06/04/2020   Recurrent falls 06/04/2020   Unspecified kyphosis, thoracic region 06/04/2020   Vitamin B12 deficiency 06/04/2020   Vitamin D deficiency 06/04/2020   Gait abnormality 05/17/2020   Ataxia 03/16/2020   Angina pectoris (HCC) 02/13/2019   Pseudophakia of both eyes 08/01/2018   History of total knee arthroplasty 03/29/2017   Stiffness of right knee 03/16/2017   OA (osteoarthritis) of knee 03/12/2017   Tremor, essential 12/03/2015   Essential hypertension 12/09/2014   CAD (coronary artery disease) 12/08/2013   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   Pure hypercholesterolemia 12/08/2013   Benign prostatic hyperplasia 08/08/2013   Essential and other specified forms of tremor 11/25/2012    PCP: Emilio Aspen, MD  REFERRING PROVIDER: Emilio Aspen, MD  REFERRING DIAG: R26.89  Balance disorder              R26.81  Gait Instability    M62.81 lower extremity weakness  THERAPY DIAG:  Other abnormalities of gait and mobility  Other lack of coordination  Muscle weakness  (generalized)  Abnormal posture  Rationale for Evaluation and Treatment: Rehabilitation  ONSET DATE: PT order 03/20/2023  SUBJECTIVE:   SUBJECTIVE STATEMENT: "I am doing pretty well.  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   PRECAUTIONS: Fall and Other: R TKA, Hx of CLL   WEIGHT BEARING RESTRICTIONS: No  FALLS:  Has patient fallen in last 6 months? Yes. Number of falls 1 when slipping on ice  LIVING ENVIRONMENT: Lives with: lives with their spouse Lives in: 2 story home with rails Stairs: yes Has following equipment at home:  cane, FWW  OCCUPATION: IT trainer, still works some  PLOF: Independent  PATIENT GOALS:  improve strength, feel more comfortable with walking, not feel that he is going to  fall  OBJECTIVE:  Note: Objective measures were completed at Evaluation unless otherwise noted.  DIAGNOSTIC FINDINGS: N/A                                                                                                                                 TREATMENT DATE:    [facility] Adult PT Treatment:                                                DATE: 05/08/23 Pt seen for aquatic therapy today.  Treatment took place in water 3.5-4.75 ft in depth at the [facility] pool. Temp of water was 91.  Pt entered/exited the pool via stairs with hand rail.  *walking forward, back and side stepping ue support barbel.  Cues for smaller BOS with side stepping *tandem stance leading R/L UE support yellow HB 3.8-4.63ft *SLS ue support yellow HB 3.8-4.51ft  *"TUG" in water. Cues for turning and balance control. *STS from bench onto water step -easy- *sts from 3rd water step once motor plan established easy *hip add sets  using bb 5 *STS with adductor set from 3rd step (VC/cga good challenge) Cue for increased momentum to rise then controlled decelleration for immediate standing balance   Pt requires the buoyancy and hydrostatic pressure of water for support, and to offload joints by unweighting joint load by at least 50 % in navel deep water and by at least 75-80% in chest to neck deep water.  Viscosity of the water is needed for resistance of strengthening. Water current perturbations provides challenge to standing balance requiring increased core activation.  05/10/23 Nustep L5-6 bilat UE/LE's x 5 mins Seated hip clams with GTB 2x15  Marching on airex 2 x 1 min Standing on airex with FT 2x30 sec Semi-tandem stance 2x30 sec each Staggered standing on airex 2x30 sec each Sidestepping x 3 laps at rail without UE support Stepping over hurdles (4) with rail assist with a step to gait pattern with CGA/min assist Lateral stepping over hurdles (4) with rail assist with CGA     [facility] Adult PT Treatment:                                                DATE: 05/08/23 Pt seen for aquatic therapy today.  Treatment took place in water 3.5-4.75 ft in depth at the [facility] pool. Temp of water was 91.  Pt entered/exited the pool via stairs with hand rail.  *intro to setting *walking forward, back and side stepping ue support barbel.  Cues for smaller BOS with side stepping *tandem stance leading R/L; FT ue support barbell;  *box stepping-> Pt calling out direction  Farmers carry forward and backward bilateral yellow->RB HB *ue support yellow HB toe raises; heel raises *Farmers carry unilateral RBHB *1/2noodle pull down wide stance then staggered x 8  Pt requires the buoyancy and hydrostatic pressure of water for support, and to offload joints by unweighting joint load by at least 50 % in navel deep water and by at least 75-80% in chest to neck deep water.  Viscosity of the water is needed for resistance of  strengthening. Water current perturbations provides challenge to standing balance requiring increased core activation.    02/27 Nustep L4-6 bilat UE/LE's x 5 mins Gait with cognitive challenged - naming animals and fruit/ veg with straight walking - looking in different directions - naming objects or observing different situations while walking  - head rotations, up/down, left/ right - walking backwards - pulling laundry basket (mimicking trash can)  - Half stairs with bilat hold on railing - marching on airex pad 1 min x2 - lifting weights 1 and 2 lbs to shelf height.  - squats  - stepping over hurdles forward x 4 laps   - lateral x6 laps    Ther ex: Reviewed HEP.  Pt performed: Seated clams with GTB 2 x 10 Standing marching 2x10 with UE support Standing heel raises with counter support 2x15 Standing toe raises with counter support 2x10 Step ups 2x10 bilat with rail on 6 inch step with CGA Nustep L3-4 bilat UE/LE's x 5 mins  Neuro Re-ed: Standing on airex: FA 2x30 sec with SBA/CGA and with FT 2x30 sec each with CGA/min assist, staggered stance with min assist x20 sec each Tandem stance 2x30 sec each with UE support and min assist Sidestepping without UE support with SBA x 3 laps at rail  Updated HEP.  Pt received a HEP handout and was instructed in correct form and appropriate frequency.  PT instructed pt in holding on to counter for support with exercise.   PATIENT EDUCATION:  Education details: relevant anatomy, exercise form, HEP, POC, dx, and rationale of interventions. Person educated: Patient Education method: Explanation, demonstration, verbal and tactile cues, handout Education comprehension: verbalized understanding, returned demonstration, verbal and tactile cues required  HOME EXERCISE PROGRAM: Access Code: [email or web address] URL: [email or web address] Date: 04/23/2023 Prepared by: Aaron Edelman  Exercises - Seated Hip Abduction with Resistance  - 1  x daily - 3-4 x weekly - 2 sets - 10 reps - Standing March with Counter Support  - 1 x daily - 7 x weekly - 2 sets - 10 reps - Heel Raises with Counter Support  - 1 x daily - 6-7 x weekly - 2 sets - 10 reps - Narrow Stance with Counter Support  - 1 x daily - 7 x weekly - 2-3 reps - 30 seconds hold  Updated HEP: - Toe Raises with Counter Support  - 1 x daily - 6-7 x weekly - 2 sets - 10 reps  ASSESSMENT:  CLINICAL IMPRESSION: Progressed balance challenges submerged.  Worked on controlling acceleration/deceleration and immediate standing balance and controlled descent.  He works hard in setting uninhibited using buoyancy and not fearful of falling.  Some difficulty with sitting balance on noodle but excellent effort. He will continue to benefit from aquatic therapy intervention and will progress readily towards land based balance goals. Plan if POC allows, to increase frequency of visits in setting   OBJECTIVE IMPAIRMENTS: Abnormal gait, decreased activity tolerance, decreased balance, decreased coordination, decreased endurance, decreased mobility, difficulty walking, decreased strength, and postural dysfunction.   ACTIVITY  LIMITATIONS: carrying, lifting, standing, squatting, and locomotion level  PARTICIPATION LIMITATIONS: shopping and community activity  PERSONAL FACTORS: Time since onset of injury/illness/exacerbation and 3+ comorbidities: FXAS, R TKA, peripheral neuropathy, CLL  are also affecting patient's functional outcome.   REHAB POTENTIAL: Good  CLINICAL DECISION MAKING: Evolving/moderate complexity  EVALUATION COMPLEXITY: Moderate   GOALS:  SHORT TERM GOALS: Target date: 05/11/2023  Pt will be independent with HEP for improved balance, strength, and functional mobility. Baseline: Goal status: INITIAL  2.  Pt will demo improved stability with turning with gait.  Baseline:  Goal status: INITIAL  3.  Pt will perform TUG safely including turning and and performing stand  to sit transfer with good control and without assistance.  Baseline:  Goal status: INITIAL  4.  Pt will be able to perform tandem stance for at least 20 seconds for improved balance and stability with daily activities.  Baseline:  Goal status: INITIAL Target date:  05/18/2023   LONG TERM GOALS: Target date: 06/01/2023   Pt will demo improved time on 5x STS test by 3 seconds and also improved form by not using his legs on the back of the chair for improved functional LE strength and performance of transfers.  Baseline:  Goal status: INITIAL  2.  Pt will demo improved R hip flexion strength to 5/5 MMT and bilat hip abd strength by at least 6-8 lbs for improved strength to perform functional mobility with increased ease and less difficulty.  Baseline:  Goal status: INITIAL  3.  Will set Berg goal next visit after testing.  Baseline:  Goal status: INITIAL  4.  Pt will report at least a 70% improvement in stability with gait and community ambulation.  Baseline:  Goal status: INITIAL  5.  Pt will report improved confidence with his daily functional mobility.  Baseline:  Goal status: INITIAL     PLAN:  PT FREQUENCY: 2x/week  PT DURATION: 6 weeks  PLANNED INTERVENTIONS: 97164- PT Re-evaluation, 97110-Therapeutic exercises, 97530- Therapeutic activity, [id number]- Neuromuscular re-education, 97535- Self Care, 16109- Manual therapy, [phone]- Gait training, [phone]- Aquatic Therapy, Patient/Family education, Balance training, Stair training, and Taping  PLAN FOR NEXT SESSION: Cont with Balance and Gait training and strengthening.    Corrie Dandy [place]) Majd Tissue MPT 05/16/23 9:31 AM [facility] [address] [place], [place], [address] Phone: [phone]   Fax:  [phone]

## 2023-05-17 ENCOUNTER — Ambulatory Visit (HOSPITAL_BASED_OUTPATIENT_CLINIC_OR_DEPARTMENT_OTHER): Payer: Medicare Other | Admitting: Physical Therapy

## 2023-05-17 DIAGNOSIS — R278 Other lack of coordination: Secondary | ICD-10-CM | POA: Diagnosis not present

## 2023-05-17 DIAGNOSIS — R2689 Other abnormalities of gait and mobility: Secondary | ICD-10-CM

## 2023-05-17 DIAGNOSIS — M6281 Muscle weakness (generalized): Secondary | ICD-10-CM | POA: Diagnosis not present

## 2023-05-17 DIAGNOSIS — R293 Abnormal posture: Secondary | ICD-10-CM | POA: Diagnosis not present

## 2023-05-17 NOTE — Therapy (Signed)
 OUTPATIENT PHYSICAL THERAPY LOWER EXTREMITY TREATMENT   Patient Name: Cole Martinez MRN: [id number] DOB:April 16, 1945, 78 y.o., male Today's Date: 05/18/2023  END OF SESSION:  PT End of Session - 05/17/23 1110     Visit Number 8    Number of Visits 12    Date for PT Re-Evaluation 06/01/23    Authorization Type MCR A and B    PT Start Time 1108    PT Stop Time 1144    PT Time Calculation (min) 36 min    Equipment Utilized During Treatment Gait belt    Activity Tolerance Patient tolerated treatment well    Behavior During Therapy WFL for tasks assessed/performed                Past Medical History:  Diagnosis Date   ADHD (attention deficit hyperactivity disorder)    B12 deficiency    BPH (benign prostatic hypertrophy)    Chronic lymphocytic leukemia (CLL), T-cell (HCC) DX 1996--  ONCOLOGIST-  DR Truett Perna   PT IS ASYMPTOMATIC--- LAST CBC W/ DIFF 06-24-2012 STABLE   Coronary artery disease CARDIOLOGIST- DR Verdis Prime   S/P STENTING LAD 1999 // Myoview 01/2019: EF 62, normal perfusion; Low Risk   Crohn's disease of ileum (HCC) SINCE 1988   ED (erectile dysfunction)    Elevated PSA    Essential and other specified forms of tremor 11/25/2012   Gait abnormality 05/17/2020   H/O adenomatous polyp of colon    Hyperlipidemia    Hypertension    Nocturia    OA (osteoarthritis)    Peripheral neuropathy    hx of, none current as of 08-04-13   Peyronie disease    S/P coronary artery stent placement OCT 1999 OF LAD   Tremor, hereditary, benign MILD RIGHT HAND   Past Surgical History:  Procedure Laterality Date   CARDIOVASCULAR STRESS TEST  11-01-2010 DR Verdis Prime   NORMAL PERFUSION STUDY/ EF 64%/ NO ISCHEMIA   cataract surgery  Bilateral feburary 2020   with lens placement    COLONOSCOPY WITH PROPOFOL N/A 09/24/2012   Procedure: COLONOSCOPY WITH PROPOFOL;  Surgeon: Charolett Bumpers, MD;  Location: [facility];  Service: Endoscopy;  Laterality: N/A;   CORONARY ANGIOPLASTY  WITH STENT PLACEMENT  OCT 1999   STENT OF LAD   CORONARY STENT INTERVENTION N/A 02/14/2019   Procedure: CORONARY STENT INTERVENTION;  Surgeon: Lyn Records, MD;  Location: [facility];  Service: Cardiovascular;  Laterality: N/A;   CYSTOSCOPY WITH URETHRAL DILATATION  03/12/2017   Procedure: CYSTOSCOPY WITH URETHRAL DILATATION;  Surgeon: Ollen Gross, MD;  Location: [facility];  Service: Orthopedics;;  [name], Resident Assisting   CYSTOSCOPY WITH URETHRAL DILATATION N/A 10/03/2018   Procedure: CYSTOSCOPY WITH URETHRAL BALLOON DILATATION WITH BILATERAL RETROGRADE PYELOGRAPHY;  Surgeon: Heloise Purpura, MD;  Location: [facility];  Service: Urology;  Laterality: N/A;   CYSTOSCOPY WITH URETHRAL DILATATION N/A 12/30/2020   Procedure: CYSTOSCOPY WITH BALLOON DILATATION OF URETHRAL STRICTURE;  Surgeon: Heloise Purpura, MD;  Location: [facility];  Service: Urology;  Laterality: N/A;   LAPAROSCOPIC INGUINAL HERNIA REPAIR Bilateral 01-10-2004   W/ MESH   LEFT HEART CATH AND CORONARY ANGIOGRAPHY N/A 02/13/2019   Procedure: LEFT HEART CATH AND CORONARY ANGIOGRAPHY;  Surgeon: Lyn Records, MD;  Location: [facility];  Service: Cardiovascular;  Laterality: N/A;   neck benign removed from neck  yrs ago   PROSTATE BIOPSY N/A 07/12/2012   Procedure: PROSTATE BIOPSY AND ULTRASOUND;  Surgeon: Kathi Ludwig,  MD;  Location: [facility];  Service: Urology;  Laterality: N/A;   PROSTATE SURGERY  2002   tuna   REMOVAL LEFT NECK LYMPH NODE  1996   TONSILLECTOMY  CHILD   TOTAL KNEE ARTHROPLASTY Right 03/12/2017   Procedure: RIGHT TOTAL KNEE ARTHROPLASTY;  Surgeon: Ollen Gross, MD;  Location: [facility];  Service: Orthopedics;  Laterality: Right;  Adductor Block   TRANSURETHRAL RESECTION OF PROSTATE N/A 08/08/2013   Procedure: TRANSURETHRAL RESECTION OF THE PROSTATE WITH GYRUS INSTRUMENTS;  Surgeon: Kathi Ludwig, MD;  Location: [facility];  Service: Urology;  Laterality: N/A;   TRANSURETHRAL  RESECTION OF PROSTATE     Patient Active Problem List   Diagnosis Date Noted   Immunization counseling 12/20/2022   Cellulitis 12/20/2022   Medication management 12/20/2022   Adult attention deficit disorder 06/04/2020   Chronic lymphocytic leukemia (HCC) 06/04/2020   Conductive hearing loss, bilateral 06/04/2020   Crohn's ileitis (HCC) 06/04/2020   Disorder of musculoskeletal system 06/04/2020   Erectile dysfunction 06/04/2020   Gastroesophageal reflux disease 06/04/2020   Gout 06/04/2020   History of adenomatous polyp of colon 06/04/2020   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   Hypothyroidism 06/04/2020   Increased frequency of urination 06/04/2020   Male hypogonadism 06/04/2020   Muscle pain 06/04/2020   Nocturia 06/04/2020   Obesity 06/04/2020   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   Osteopenia 06/04/2020   Peripheral neurogenic pain 06/04/2020   Recurrent falls 06/04/2020   Unspecified kyphosis, thoracic region 06/04/2020   Vitamin B12 deficiency 06/04/2020   Vitamin D deficiency 06/04/2020   Gait abnormality 05/17/2020   Ataxia 03/16/2020   Angina pectoris (HCC) 02/13/2019   Pseudophakia of both eyes 08/01/2018   History of total knee arthroplasty 03/29/2017   Stiffness of right knee 03/16/2017   OA (osteoarthritis) of knee 03/12/2017   Tremor, essential 12/03/2015   Essential hypertension 12/09/2014   CAD (coronary artery disease) 12/08/2013   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   Pure hypercholesterolemia 12/08/2013   Benign prostatic hyperplasia 08/08/2013   Essential and other specified forms of tremor 11/25/2012    PCP: Emilio Aspen, MD  REFERRING PROVIDER: Emilio Aspen, MD  REFERRING DIAG: R26.89  Balance disorder              R26.81  Gait Instability    M62.81 lower extremity weakness  THERAPY DIAG:  Other abnormalities of gait and mobility  Other lack of coordination  Muscle weakness  (generalized)  Rationale for Evaluation and Treatment: Rehabilitation  ONSET DATE: PT order 03/20/2023  SUBJECTIVE:   SUBJECTIVE STATEMENT: Pt states he did well after prior aquatic therapy.  Pt is walking a little bit more without the cane.  Pt has had no falls since onset of therapy.   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   PRECAUTIONS: Fall and Other: R TKA, Hx of CLL   WEIGHT BEARING RESTRICTIONS: No  FALLS:  Has patient fallen in last 6 months? Yes. Number of falls 1 when slipping on ice  LIVING ENVIRONMENT: Lives with: lives with their spouse Lives in: 2 story home with rails Stairs: yes Has following equipment at home:  cane, FWW  OCCUPATION: IT trainer, still works some  PLOF: Independent  PATIENT GOALS:  improve strength, feel more comfortable with walking, not feel that he is going to fall  OBJECTIVE:  Note: Objective measures were completed at Evaluation unless otherwise noted.  DIAGNOSTIC FINDINGS: N/A                                                                                                                                 TREATMENT DATE:    Nustep L5-6 bilat UE/LE's x 5 mins  Marching on airex 2 x 1 min Standing on airex with FT 2x30 sec Semi-tandem stance 3x30 sec each Tandem gait with UE support on rail and CGA Stepping over hurdles (4) with rail assist with a step to gait and reciprocal gait pattern with CGA/min assist Lateral stepping over hurdles (4) with rail assist with CGA/min assist   PATIENT EDUCATION:  Education details: relevant anatomy, exercise form, HEP, POC, dx, and rationale of interventions. Person educated: Patient Education method: Explanation, demonstration, verbal and tactile cues,  handout Education comprehension: verbalized understanding, returned demonstration, verbal and tactile cues required  HOME EXERCISE PROGRAM: Access Code: [email or web address] URL: [email or web address] Date: 04/23/2023 Prepared by: Aaron Edelman  Exercises - Seated Hip Abduction with Resistance  - 1 x daily - 3-4 x weekly - 2 sets - 10 reps - Standing March with Counter Support  - 1 x daily - 7 x weekly - 2 sets - 10 reps - Heel Raises with Counter Support  - 1 x daily - 6-7 x weekly - 2 sets - 10 reps - Narrow Stance with Counter Support  - 1 x daily - 7 x weekly - 2-3 reps - 30 seconds hold  Updated HEP: - Toe Raises with Counter Support  - 1 x daily - 6-7 x weekly - 2 sets - 10 reps  ASSESSMENT:  CLINICAL IMPRESSION: 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  Pt performed exercises well with cuing for correct form and sequencing.  Pt demonstrates much improved performance of stepping over hurdles including being able to perform a reciprocal gait over the hurdles.  He does require UE support on rail.  Pt did have some LOB with hurdles with fwd stepping and sidestepping requiring min assistance from PT to correct LOB.  Pt responded well to Rx having no c/o's after Rx.  He should benefit from cont skilled PT to address impairments and goals and to improve overall function.     OBJECTIVE IMPAIRMENTS: Abnormal gait, decreased activity tolerance, decreased balance, decreased coordination, decreased endurance, decreased mobility, difficulty walking, decreased strength, and postural dysfunction.   ACTIVITY LIMITATIONS: carrying, lifting, standing, squatting, and locomotion level  PARTICIPATION LIMITATIONS: shopping and community activity  PERSONAL FACTORS: Time since onset of injury/illness/exacerbation and 3+ comorbidities: FXAS, R TKA, peripheral neuropathy, CLL  are also affecting patient's functional outcome.   REHAB POTENTIAL: Good  CLINICAL DECISION MAKING: Evolving/moderate  complexity  EVALUATION COMPLEXITY: Moderate   GOALS:  SHORT TERM GOALS: Target date: 05/11/2023  Pt will be independent  with HEP for improved balance, strength, and functional mobility. Baseline: Goal status: INITIAL  2.  Pt will demo improved stability with turning with gait.  Baseline:  Goal status: INITIAL  3.  Pt will perform TUG safely including turning and and performing stand to sit transfer with good control and without assistance.  Baseline:  Goal status: INITIAL  4.  Pt will be able to perform tandem stance for at least 20 seconds for improved balance and stability with daily activities.  Baseline:  Goal status: INITIAL Target date:  05/18/2023   LONG TERM GOALS: Target date: 06/01/2023   Pt will demo improved time on 5x STS test by 3 seconds and also improved form by not using his legs on the back of the chair for improved functional LE strength and performance of transfers.  Baseline:  Goal status: INITIAL  2.  Pt will demo improved R hip flexion strength to 5/5 MMT and bilat hip abd strength by at least 6-8 lbs for improved strength to perform functional mobility with increased ease and less difficulty.  Baseline:  Goal status: INITIAL  3.  Will set Berg goal next visit after testing.  Baseline:  Goal status: INITIAL  4.  Pt will report at least a 70% improvement in stability with gait and community ambulation.  Baseline:  Goal status: INITIAL  5.  Pt will report improved confidence with his daily functional mobility.  Baseline:  Goal status: INITIAL     PLAN:  PT FREQUENCY: 2x/week  PT DURATION: 6 weeks  PLANNED INTERVENTIONS: 97164- PT Re-evaluation, 97110-Therapeutic exercises, 97530- Therapeutic activity, [id number]- Neuromuscular re-education, 97535- Self Care, 32440- Manual therapy, [phone]- Gait training, [phone]- Aquatic Therapy, Patient/Family education, Balance training, Stair training, and Taping  PLAN FOR NEXT SESSION: Cont with Balance and  Gait training and strengthening.  Cont with aquatic and land based PT.   Audie Clear III PT, DPT 05/18/23 11:49 AM

## 2023-05-18 ENCOUNTER — Encounter (HOSPITAL_BASED_OUTPATIENT_CLINIC_OR_DEPARTMENT_OTHER): Payer: Self-pay | Admitting: Physical Therapy

## 2023-05-18 ENCOUNTER — Encounter: Payer: Self-pay | Admitting: Dietician

## 2023-05-18 ENCOUNTER — Encounter: Payer: Medicare Other | Attending: Internal Medicine | Admitting: Dietician

## 2023-05-18 DIAGNOSIS — R7303 Prediabetes: Secondary | ICD-10-CM | POA: Insufficient documentation

## 2023-05-18 NOTE — Progress Notes (Signed)
 Patient was seen on 05/18/2023 for the Core Session 5 of Diabetes Prevention Program course at [facility]. By the end of this session patients are able to complete the following objectives:   Learning Objectives: Establish a physical activity goal. Explain the importance of the physical activity goal. Describe their current level of physical activity. Name ways that they are already physically active. Develop personal plans for physical activity for the next week.   Goals:  Record weight taken outside of class.  Track foods and beverages eaten each day in the "Food and Activity Tracker," including calories and fat grams for each item.  Make an Activity Plan including date, specific type of activity, and length of time you plan to be active that includes at last 60 minutes of activity for the week.  Track activity type, minutes you were active, and distance you reached each day in the "Food and Activity Tracker."   Follow-Up Plan: Attend Core Session 6 next week.  Bring completed "Food and Activity Tracker" next week to be reviewed by Lifestyle Coach.

## 2023-05-22 ENCOUNTER — Ambulatory Visit (HOSPITAL_BASED_OUTPATIENT_CLINIC_OR_DEPARTMENT_OTHER): Payer: Medicare Other | Admitting: Physical Therapy

## 2023-05-22 DIAGNOSIS — M6281 Muscle weakness (generalized): Secondary | ICD-10-CM

## 2023-05-22 DIAGNOSIS — R2689 Other abnormalities of gait and mobility: Secondary | ICD-10-CM

## 2023-05-22 DIAGNOSIS — R278 Other lack of coordination: Secondary | ICD-10-CM | POA: Diagnosis not present

## 2023-05-22 DIAGNOSIS — R293 Abnormal posture: Secondary | ICD-10-CM | POA: Diagnosis not present

## 2023-05-22 NOTE — Therapy (Signed)
 OUTPATIENT PHYSICAL THERAPY LOWER EXTREMITY TREATMENT   Patient Name: Cole Martinez MRN: [id number] DOB:[birth year], 78 y.o., male Today's Date: 05/23/2023  END OF SESSION:  PT End of Session - 05/22/23 1038     Visit Number 9    Number of Visits 12    Date for PT Re-Evaluation 06/01/23    Authorization Type MCR A and B    PT Start Time 1028    PT Stop Time 1107   Pt had to use restroom during treatment   PT Time Calculation (min) 39 min    Equipment Utilized During Treatment Gait belt    Activity Tolerance Patient tolerated treatment well    Behavior During Therapy WFL for tasks assessed/performed                Past Medical History:  Diagnosis Date   ADHD (attention deficit hyperactivity disorder)    B12 deficiency    BPH (benign prostatic hypertrophy)    Chronic lymphocytic leukemia (CLL), T-cell (HCC) DX 1996--  ONCOLOGIST-  DR Truett Perna   PT IS ASYMPTOMATIC--- LAST CBC W/ DIFF 06-24-2012 STABLE   Coronary artery disease CARDIOLOGIST- DR Verdis Prime   S/P STENTING LAD 1999 // Myoview 01/2019: EF 62, normal perfusion; Low Risk   Crohn's disease of ileum (HCC) SINCE 1988   ED (erectile dysfunction)    Elevated PSA    Essential and other specified forms of tremor 11/25/2012   Gait abnormality 05/17/2020   H/O adenomatous polyp of colon    Hyperlipidemia    Hypertension    Nocturia    OA (osteoarthritis)    Peripheral neuropathy    hx of, none current as of 08-04-13   Peyronie disease    S/P coronary artery stent placement OCT 1999 OF LAD   Tremor, hereditary, benign MILD RIGHT HAND   Past Surgical History:  Procedure Laterality Date   CARDIOVASCULAR STRESS TEST  11-01-2010 DR Verdis Prime   NORMAL PERFUSION STUDY/ EF 64%/ NO ISCHEMIA   cataract surgery  Bilateral feburary 2020   with lens placement    COLONOSCOPY WITH PROPOFOL N/A 09/24/2012   Procedure: COLONOSCOPY WITH PROPOFOL;  Surgeon: Charolett Bumpers, MD;  Location: [facility];  Service: Endoscopy;   Laterality: N/A;   CORONARY ANGIOPLASTY WITH STENT PLACEMENT  OCT 1999   STENT OF LAD   CORONARY STENT INTERVENTION N/A 02/14/2019   Procedure: CORONARY STENT INTERVENTION;  Surgeon: Lyn Records, MD;  [facility]: [facility];  Service: Cardiovascular;  Laterality: N/A;   CYSTOSCOPY WITH URETHRAL DILATATION  03/12/2017   Procedure: CYSTOSCOPY WITH URETHRAL DILATATION;  Surgeon: Ollen Gross, MD;  Location: [facility];  Service: Orthopedics;;  [name], Resident Assisting   CYSTOSCOPY WITH URETHRAL DILATATION N/A 10/03/2018   Procedure: CYSTOSCOPY WITH URETHRAL BALLOON DILATATION WITH BILATERAL RETROGRADE PYELOGRAPHY;  Surgeon: Heloise Purpura, MD;  Location: [facility];  Service: Urology;  Laterality: N/A;   CYSTOSCOPY WITH URETHRAL DILATATION N/A 12/30/2020   Procedure: CYSTOSCOPY WITH BALLOON DILATATION OF URETHRAL STRICTURE;  Surgeon: Heloise Purpura, MD;  Location: [facility];  Service: Urology;  Laterality: N/A;   LAPAROSCOPIC INGUINAL HERNIA REPAIR Bilateral 01-10-2004   W/ MESH   LEFT HEART CATH AND CORONARY ANGIOGRAPHY N/A 02/13/2019   Procedure: LEFT HEART CATH AND CORONARY ANGIOGRAPHY;  Surgeon: Lyn Records, MD;  [facility]: [facility];  Service: Cardiovascular;  Laterality: N/A;   neck benign removed from neck  yrs ago   PROSTATE BIOPSY N/A 07/12/2012   Procedure: PROSTATE  BIOPSY AND ULTRASOUND;  Surgeon: Kathi Ludwig, MD;  Location: [facility];  Service: Urology;  Laterality: N/A;   PROSTATE SURGERY  2002   tuna   REMOVAL LEFT NECK LYMPH NODE  1996   TONSILLECTOMY  CHILD   TOTAL KNEE ARTHROPLASTY Right 03/12/2017   Procedure: RIGHT TOTAL KNEE ARTHROPLASTY;  Surgeon: Ollen Gross, MD;  Location: [facility];  Service: Orthopedics;  Laterality: Right;  Adductor Block   TRANSURETHRAL RESECTION OF PROSTATE N/A 08/08/2013   Procedure: TRANSURETHRAL RESECTION OF THE PROSTATE WITH GYRUS INSTRUMENTS;  Surgeon: Kathi Ludwig, MD;  Location: [facility];  Service:  Urology;  Laterality: N/A;   TRANSURETHRAL RESECTION OF PROSTATE     Patient Active Problem List   Diagnosis Date Noted   Immunization counseling 12/20/2022   Cellulitis 12/20/2022   Medication management 12/20/2022   Adult attention deficit disorder 06/04/2020   Chronic lymphocytic leukemia (HCC) 06/04/2020   Conductive hearing loss, bilateral 06/04/2020   Crohn's ileitis (HCC) 06/04/2020   Disorder of musculoskeletal system 06/04/2020   Erectile dysfunction 06/04/2020   Gastroesophageal reflux disease 06/04/2020   Gout 06/04/2020   History of adenomatous polyp of colon 06/04/2020   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   Hypothyroidism 06/04/2020   Increased frequency of urination 06/04/2020   Male hypogonadism 06/04/2020   Muscle pain 06/04/2020   Nocturia 06/04/2020   Obesity 06/04/2020   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   Osteopenia 06/04/2020   Peripheral neurogenic pain 06/04/2020   Recurrent falls 06/04/2020   Unspecified kyphosis, thoracic region 06/04/2020   Vitamin B12 deficiency 06/04/2020   Vitamin D deficiency 06/04/2020   Gait abnormality 05/17/2020   Ataxia 03/16/2020   Angina pectoris (HCC) 02/13/2019   Pseudophakia of both eyes 08/01/2018   History of total knee arthroplasty 03/29/2017   Stiffness of right knee 03/16/2017   OA (osteoarthritis) of knee 03/12/2017   Tremor, essential 12/03/2015   Essential hypertension 12/09/2014   CAD (coronary artery disease) 12/08/2013   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   Pure hypercholesterolemia 12/08/2013   Benign prostatic hyperplasia 08/08/2013   Essential and other specified forms of tremor 11/25/2012    PCP: Emilio Aspen, MD  REFERRING PROVIDER: Emilio Aspen, MD  REFERRING DIAG: R26.89  Balance disorder              R26.81  Gait Instability    M62.81 lower extremity weakness  THERAPY DIAG:  Other abnormalities of gait and mobility  Other lack of  coordination  Muscle weakness (generalized)  Rationale for Evaluation and Treatment: Rehabilitation  ONSET DATE: PT order 03/20/2023  SUBJECTIVE:   SUBJECTIVE STATEMENT: Pt states he did well after prior aquatic therapy.  Pt is walking a little bit more without the cane.  Pt has had no falls since onset of therapy.   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   PRECAUTIONS: Fall and Other: R TKA, Hx of CLL   WEIGHT BEARING RESTRICTIONS: No  FALLS:  Has patient fallen in last 6 months? Yes. Number of falls 1 when slipping on ice  LIVING ENVIRONMENT: Lives with: lives with their spouse Lives in: 2 story home with rails Stairs: yes Has following equipment at home:  cane, FWW  OCCUPATION: IT trainer, still  works some  PLOF: Independent  PATIENT GOALS:  improve strength, feel more comfortable with walking, not feel that he is going to fall  OBJECTIVE:  Note: Objective measures were completed at Evaluation unless otherwise noted.  DIAGNOSTIC FINDINGS: N/A                                                                                                                                 TREATMENT DATE:    Nustep L6 bilat UE/LE's x 5 mins  Standing on airex with FT 2x30 sec with min to mod assist for balance. One occasion of max assist with LOB Staggered standing on airex 2x30 sec each with CGA Marching on airex 2 x 1 min Tandem gait with UE support on rail and CGA   Step ups 2x10 bilat with rail on 6 inch step with CGA  Lateral step ups with rail on 6 inch step 2x10   PATIENT EDUCATION:  Education details: relevant anatomy, exercise form, HEP, POC, dx, and rationale of interventions. Person educated: Patient Education method:  Explanation, demonstration, verbal and tactile cues Education comprehension: verbalized understanding, returned demonstration, verbal and tactile cues required  HOME EXERCISE PROGRAM: Access Code: [email or web address] URL: [email or web address] Date: 04/23/2023 Prepared by: Aaron Edelman   ASSESSMENT:  CLINICAL IMPRESSION: Pt presents to Rx stating he is walking a little bit more without his cane.  Pt had difficulty with standing with FT on airex having increased LOB.  He required increased PT assist to correct LOB including max assist on one occasion.  He requires UE support on rail to perform tandem gait.  Pt required instruction to slow down and improve control with marching on airex.  He gives good effort with all exercises.  Pt responded well to Rx reporting having no c/o's after Rx.  He should benefit from cont skilled PT to address impairments and goals and to improve overall function.   OBJECTIVE IMPAIRMENTS: Abnormal gait, decreased activity tolerance, decreased balance, decreased coordination, decreased endurance, decreased mobility, difficulty walking, decreased strength, and postural dysfunction.   ACTIVITY LIMITATIONS: carrying, lifting, standing, squatting, and locomotion level  PARTICIPATION LIMITATIONS: shopping and community activity  PERSONAL FACTORS: Time since onset of injury/illness/exacerbation and 3+ comorbidities: FXAS, R TKA, peripheral neuropathy, CLL  are also affecting patient's functional outcome.   REHAB POTENTIAL: Good  CLINICAL DECISION MAKING: Evolving/moderate complexity  EVALUATION COMPLEXITY: Moderate   GOALS:  SHORT TERM GOALS: Target date: 05/11/2023  Pt will be independent with HEP for improved balance, strength, and functional mobility. Baseline: Goal status: INITIAL  2.  Pt will demo improved stability with turning with gait.  Baseline:  Goal status: INITIAL  3.  Pt will perform TUG safely including turning and and performing stand  to sit transfer with good control and without assistance.  Baseline:  Goal status: INITIAL  4.  Pt will be able to perform tandem stance for at least 20 seconds for improved balance and stability with  daily activities.  Baseline:  Goal status: INITIAL Target date:  05/18/2023   LONG TERM GOALS: Target date: 06/01/2023   Pt will demo improved time on 5x STS test by 3 seconds and also improved form by not using his legs on the back of the chair for improved functional LE strength and performance of transfers.  Baseline:  Goal status: INITIAL  2.  Pt will demo improved R hip flexion strength to 5/5 MMT and bilat hip abd strength by at least 6-8 lbs for improved strength to perform functional mobility with increased ease and less difficulty.  Baseline:  Goal status: INITIAL  3.  Will set Berg goal next visit after testing.  Baseline:  Goal status: INITIAL  4.  Pt will report at least a 70% improvement in stability with gait and community ambulation.  Baseline:  Goal status: INITIAL  5.  Pt will report improved confidence with his daily functional mobility.  Baseline:  Goal status: INITIAL     PLAN:  PT FREQUENCY: 2x/week  PT DURATION: 6 weeks  PLANNED INTERVENTIONS: 97164- PT Re-evaluation, 97110-Therapeutic exercises, 97530- Therapeutic activity, [id number]- Neuromuscular re-education, 97535- Self Care, 16109- Manual therapy, [phone]- Gait training, [phone]- Aquatic Therapy, Patient/Family education, Balance training, Stair training, and Taping  PLAN FOR NEXT SESSION: Cont with Balance and Gait training and strengthening.  Cont with aquatic and land based PT.   Audie Clear III PT, DPT 05/23/23 12:22 PM

## 2023-05-23 ENCOUNTER — Encounter (HOSPITAL_BASED_OUTPATIENT_CLINIC_OR_DEPARTMENT_OTHER): Payer: Self-pay | Admitting: Physical Therapy

## 2023-05-24 ENCOUNTER — Encounter (HOSPITAL_BASED_OUTPATIENT_CLINIC_OR_DEPARTMENT_OTHER): Payer: Self-pay | Admitting: Physical Therapy

## 2023-05-24 ENCOUNTER — Ambulatory Visit (HOSPITAL_BASED_OUTPATIENT_CLINIC_OR_DEPARTMENT_OTHER): Payer: Medicare Other | Admitting: Physical Therapy

## 2023-05-24 DIAGNOSIS — M6281 Muscle weakness (generalized): Secondary | ICD-10-CM | POA: Diagnosis not present

## 2023-05-24 DIAGNOSIS — R278 Other lack of coordination: Secondary | ICD-10-CM | POA: Diagnosis not present

## 2023-05-24 DIAGNOSIS — R2689 Other abnormalities of gait and mobility: Secondary | ICD-10-CM

## 2023-05-24 DIAGNOSIS — R293 Abnormal posture: Secondary | ICD-10-CM

## 2023-05-24 NOTE — Therapy (Signed)
 OUTPATIENT PHYSICAL THERAPY LOWER EXTREMITY TREATMENT Progress Note Reporting Period 04/20/23 to 05/24/23  See note below for Objective Data and Assessment of Progress/Goals.      Patient Name: Cole Martinez MRN: [id number] DOB:1945-07-30, 78 y.o., male Today's Date: 05/24/2023  END OF SESSION:  PT End of Session - 05/24/23 1451     Visit Number 10    Number of Visits 12    Date for PT Re-Evaluation 06/01/23    Authorization Type MCR A and B    Progress Note Due on Visit 20    PT Start Time 1447    PT Stop Time 1525    PT Time Calculation (min) 38 min    Equipment Utilized During Treatment Gait belt    Activity Tolerance Patient tolerated treatment well    Behavior During Therapy WFL for tasks assessed/performed                Past Medical History:  Diagnosis Date   ADHD (attention deficit hyperactivity disorder)    B12 deficiency    BPH (benign prostatic hypertrophy)    Chronic lymphocytic leukemia (CLL), T-cell (HCC) DX 1996--  ONCOLOGIST-  DR Truett Perna   PT IS ASYMPTOMATIC--- LAST CBC W/ DIFF 06-24-2012 STABLE   Coronary artery disease CARDIOLOGIST- DR Verdis Prime   S/P STENTING LAD 1999 // Myoview 01/2019: EF 62, normal perfusion; Low Risk   Crohn's disease of ileum (HCC) SINCE 1988   ED (erectile dysfunction)    Elevated PSA    Essential and other specified forms of tremor 11/25/2012   Gait abnormality 05/17/2020   H/O adenomatous polyp of colon    Hyperlipidemia    Hypertension    Nocturia    OA (osteoarthritis)    Peripheral neuropathy    hx of, none current as of 08-04-13   Peyronie disease    S/P coronary artery stent placement OCT 1999 OF LAD   Tremor, hereditary, benign MILD RIGHT HAND   Past Surgical History:  Procedure Laterality Date   CARDIOVASCULAR STRESS TEST  11-01-2010 DR Verdis Prime   NORMAL PERFUSION STUDY/ EF 64%/ NO ISCHEMIA   cataract surgery  Bilateral feburary 2020   with lens placement    COLONOSCOPY WITH PROPOFOL N/A  09/24/2012   Procedure: COLONOSCOPY WITH PROPOFOL;  Surgeon: Charolett Bumpers, MD;  Location: [facility];  Service: Endoscopy;  Laterality: N/A;   CORONARY ANGIOPLASTY WITH STENT PLACEMENT  OCT 1999   STENT OF LAD   CORONARY STENT INTERVENTION N/A 02/14/2019   Procedure: CORONARY STENT INTERVENTION;  Surgeon: Lyn Records, MD;  Location: [facility];  Service: Cardiovascular;  Laterality: N/A;   CYSTOSCOPY WITH URETHRAL DILATATION  03/12/2017   Procedure: CYSTOSCOPY WITH URETHRAL DILATATION;  Surgeon: Ollen Gross, MD;  Location: [facility];  Service: Orthopedics;;  [name], Resident Assisting   CYSTOSCOPY WITH URETHRAL DILATATION N/A 10/03/2018   Procedure: CYSTOSCOPY WITH URETHRAL BALLOON DILATATION WITH BILATERAL RETROGRADE PYELOGRAPHY;  Surgeon: Heloise Purpura, MD;  Location: [facility];  Service: Urology;  Laterality: N/A;   CYSTOSCOPY WITH URETHRAL DILATATION N/A 12/30/2020   Procedure: CYSTOSCOPY WITH BALLOON DILATATION OF URETHRAL STRICTURE;  Surgeon: Heloise Purpura, MD;  Location: [facility];  Service: Urology;  Laterality: N/A;   LAPAROSCOPIC INGUINAL HERNIA REPAIR Bilateral 01-10-2004   W/ MESH   LEFT HEART CATH AND CORONARY ANGIOGRAPHY N/A 02/13/2019   Procedure: LEFT HEART CATH AND CORONARY ANGIOGRAPHY;  Surgeon: Lyn Records, MD;  Location: [facility];  Service: Cardiovascular;  Laterality: N/A;   neck benign removed from neck  yrs ago   PROSTATE BIOPSY N/A 07/12/2012   Procedure: PROSTATE BIOPSY AND ULTRASOUND;  Surgeon: Kathi Ludwig, MD;  Location: [facility];  Service: Urology;  Laterality: N/A;   PROSTATE SURGERY  2002   tuna   REMOVAL LEFT NECK LYMPH NODE  1996   TONSILLECTOMY  CHILD   TOTAL KNEE ARTHROPLASTY Right 03/12/2017   Procedure: RIGHT TOTAL KNEE ARTHROPLASTY;  Surgeon: Ollen Gross, MD;  Location: [facility];  Service: Orthopedics;  Laterality: Right;  Adductor Block   TRANSURETHRAL RESECTION OF PROSTATE N/A 08/08/2013   Procedure:  TRANSURETHRAL RESECTION OF THE PROSTATE WITH GYRUS INSTRUMENTS;  Surgeon: Kathi Ludwig, MD;  Location: [facility];  Service: Urology;  Laterality: N/A;   TRANSURETHRAL RESECTION OF PROSTATE     Patient Active Problem List   Diagnosis Date Noted   Immunization counseling 12/20/2022   Cellulitis 12/20/2022   Medication management 12/20/2022   Adult attention deficit disorder 06/04/2020   Chronic lymphocytic leukemia (HCC) 06/04/2020   Conductive hearing loss, bilateral 06/04/2020   Crohn's ileitis (HCC) 06/04/2020   Disorder of musculoskeletal system 06/04/2020   Erectile dysfunction 06/04/2020   Gastroesophageal reflux disease 06/04/2020   Gout 06/04/2020   History of adenomatous polyp of colon 06/04/2020   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   Hypothyroidism 06/04/2020   Increased frequency of urination 06/04/2020   Male hypogonadism 06/04/2020   Muscle pain 06/04/2020   Nocturia 06/04/2020   Obesity 06/04/2020   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   Osteopenia 06/04/2020   Peripheral neurogenic pain 06/04/2020   Recurrent falls 06/04/2020   Unspecified kyphosis, thoracic region 06/04/2020   Vitamin B12 deficiency 06/04/2020   Vitamin D deficiency 06/04/2020   Gait abnormality 05/17/2020   Ataxia 03/16/2020   Angina pectoris (HCC) 02/13/2019   Pseudophakia of both eyes 08/01/2018   History of total knee arthroplasty 03/29/2017   Stiffness of right knee 03/16/2017   OA (osteoarthritis) of knee 03/12/2017   Tremor, essential 12/03/2015   Essential hypertension 12/09/2014   CAD (coronary artery disease) 12/08/2013   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   Pure hypercholesterolemia 12/08/2013   Benign prostatic hyperplasia 08/08/2013   Essential and other specified forms of tremor 11/25/2012    PCP: Emilio Aspen, MD  REFERRING PROVIDER: Emilio Aspen, MD  REFERRING DIAG: R26.89  Balance disorder              R26.81  Gait  Instability    M62.81 lower extremity weakness  THERAPY DIAG:  Other abnormalities of gait and mobility  Other lack of coordination  Muscle weakness (generalized)  Abnormal posture  Rationale for Evaluation and Treatment: Rehabilitation  ONSET DATE: PT order 03/20/2023  SUBJECTIVE:   SUBJECTIVE STATEMENT: Pt states she is slowly getting better.   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   PRECAUTIONS: Fall and Other: R TKA, Hx of CLL   WEIGHT BEARING RESTRICTIONS: No  FALLS:  Has patient fallen in last 6 months? Yes. Number of falls 1 when slipping on ice  LIVING ENVIRONMENT: Lives with: lives with their spouse Lives in: 2 story home with rails Stairs: yes Has following equipment at home:  cane, FWW  OCCUPATION:  CPA, still works some  PLOF: Independent  PATIENT GOALS:  improve strength, feel more comfortable with walking, not feel that he is going to fall  OBJECTIVE:  Note: Objective measures were completed at Evaluation unless otherwise noted.  DIAGNOSTIC FINDINGS: N/A                                                                                                                                 TREATMENT DATE:    DATE: 05/24/23 Pt seen for aquatic therapy today.  Treatment took place in water 3.5-4.75 ft in depth at the [facility] pool. Temp of water was 91.  Pt entered/exited the pool via stairs with hand rail.   *walking forward, back and side stepping ue support barbel.   *ue support yellow noodle->unsupported: pivoting R/L forward and back ward amb 180d and 90d turning  *sts from 3rd water step x5 *hip add sets using bb 5 *STS with adductor set from 3rd step CGA 4/5reps  - 4th from bottom  step *tandem stance leading R/L UE add/abd using RBHB (good challenge)  encouraged x 5 consecutive reps *forward and backward tandem walking ue support RBHB *light green resistance bells arm swing walking forward and back. Pt with difficulty maintaining direct path     Pt requires the buoyancy and hydrostatic pressure of water for support, and to offload joints by unweighting joint load by at least 50 % in navel deep water and by at least 75-80% in chest to neck deep water.  Viscosity of the water is needed for resistance of strengthening. Water current perturbations provides challenge to standing balance requiring increased core activation.   Last Land Nustep L6 bilat UE/LE's x 5 mins  Standing on airex with FT 2x30 sec with min to mod assist for balance. One occasion of max assist with LOB Staggered standing on airex 2x30 sec each with CGA Marching on airex 2 x 1 min Tandem gait with UE support on rail and CGA   Step ups 2x10 bilat with rail on 6 inch step with CGA  Lateral step ups with rail on 6 inch step 2x10   PATIENT EDUCATION:  Education details: relevant anatomy, exercise form, HEP, POC, dx, and rationale of interventions. Person educated: Patient Education method: Explanation, demonstration, verbal and tactile cues Education comprehension: verbalized understanding, returned demonstration, verbal and tactile cues required  HOME EXERCISE PROGRAM: Access Code: [email or web address] URL: [email or web address] Date: 04/23/2023 Prepared by: Aaron Edelman   ASSESSMENT:  CLINICAL IMPRESSION: PN: pt seen in aquatics.  He has progressed in all areas while submerged.  He is turning with good stability and performing tandem stance >20s.  Able to progress static tandem exercise to dynamic with good challenge.  He has not had a fall since beginning therapy. He has been compliant with HEP (other than while ill) and continues to ride stationary bike 40 min 2 x weekly. He does continue  to struggle with standing dynamic balance  on land particularly with turning and transitional movements. He will continue to benefit from skilled PT intervention in both settings to further progress toward all land based goals as written. Plan to complete functional and objective testing next land based visit    OBJECTIVE IMPAIRMENTS: Abnormal gait, decreased activity tolerance, decreased balance, decreased coordination, decreased endurance, decreased mobility, difficulty walking, decreased strength, and postural dysfunction.   ACTIVITY LIMITATIONS: carrying, lifting, standing, squatting, and locomotion level  PARTICIPATION LIMITATIONS: shopping and community activity  PERSONAL FACTORS: Time since onset of injury/illness/exacerbation and 3+ comorbidities: FXAS, R TKA, peripheral neuropathy, CLL  are also affecting patient's functional outcome.   REHAB POTENTIAL: Good  CLINICAL DECISION MAKING: Evolving/moderate complexity  EVALUATION COMPLEXITY: Moderate   GOALS:  SHORT TERM GOALS: Target date: 05/11/2023  Pt will be independent with HEP for improved balance, strength, and functional mobility. Baseline: Goal status: In progress 05/24/23  2.  Pt will demo improved stability with turning with gait.  Baseline:  Goal status: In progress 05/24/23  3.  Pt will perform TUG safely including turning and and performing stand to sit transfer with good control and without assistance.  Baseline:  Goal status: In progress 05/24/23  4.  Pt will be able to perform tandem stance for at least 20 seconds for improved balance and stability with daily activities.  Baseline:  Goal status: in progress 05/24/23 Target date:  05/18/2023   LONG TERM GOALS: Target date: 06/01/2023   Pt will demo improved time on 5x STS test by 3 seconds and also improved form by not using his legs on the back of the chair for improved functional LE strength and performance of transfers.  Baseline:  Goal status:  INITIAL  2.  Pt will demo improved R hip flexion strength to 5/5 MMT and bilat hip abd strength by at least 6-8 lbs for improved strength to perform functional mobility with increased ease and less difficulty.  Baseline:  Goal status: INITIAL  3.  Will set Berg goal next visit after testing.  Baseline:  Goal status: INITIAL  4.  Pt will report at least a 70% improvement in stability with gait and community ambulation.  Baseline:  Goal status: INITIAL  5.  Pt will report improved confidence with his daily functional mobility.  Baseline:  Goal status: INITIAL     PLAN:  PT FREQUENCY: 2x/week  PT DURATION: 6 weeks  PLANNED INTERVENTIONS: 97164- PT Re-evaluation, 97110-Therapeutic exercises, 97530- Therapeutic activity, [id number]- Neuromuscular re-education, 97535- Self Care, 86578- Manual therapy, [phone]- Gait training, [phone]- Aquatic Therapy, Patient/Family education, Balance training, Stair training, and Taping  PLAN FOR NEXT SESSION: Cont with Balance and Gait training and strengthening.  Cont with aquatic and land based PT.   Corrie Dandy [place]) Nami Strawder MPT 05/24/23 4:40 PM [facility] [address] [place], [place], [address] Phone: [phone]   Fax:  [phone]

## 2023-05-25 ENCOUNTER — Encounter (HOSPITAL_BASED_OUTPATIENT_CLINIC_OR_DEPARTMENT_OTHER): Payer: Medicare Other | Admitting: Dietician

## 2023-05-25 ENCOUNTER — Encounter: Payer: Self-pay | Admitting: Dietician

## 2023-05-25 DIAGNOSIS — R7303 Prediabetes: Secondary | ICD-10-CM

## 2023-05-25 NOTE — Progress Notes (Signed)
 Patient was seen on 05/25/2023 for the Core Session 6 of Diabetes Prevention Program course at [facility]. By the end of this session patients are able to complete the following objectives:   Learning Objectives: Graph their daily physical activity.  Describe two ways of finding the time to be active.  Define "lifestyle activity."  Describe how to prevent injury.  Develop an activity plan for the coming week.   Goals:  Record weight taken outside of class.  Track foods and beverages eaten each day in the "Food and Activity Tracker," including calories and fat grams for each item.   Track activity type, minutes you were active, and distance you reached each day in the "Food and Activity Tracker."  Set aside one 20 to 30-minute block of time every day or find two or more periods of 10 to15 minutes each for physical activity.  Warm up, cool down, and stretch. Make a Physical Activities Plan for the Week.   Follow-Up Plan: Attend Core Session 7 next week.  Bring completed "Food and Activity Tracker" next week to be reviewed by Lifestyle Coach.

## 2023-05-29 ENCOUNTER — Encounter (HOSPITAL_BASED_OUTPATIENT_CLINIC_OR_DEPARTMENT_OTHER): Payer: Medicare Other | Admitting: Physical Therapy

## 2023-05-31 ENCOUNTER — Encounter (HOSPITAL_BASED_OUTPATIENT_CLINIC_OR_DEPARTMENT_OTHER): Payer: Medicare Other | Admitting: Physical Therapy

## 2023-06-01 ENCOUNTER — Encounter (HOSPITAL_BASED_OUTPATIENT_CLINIC_OR_DEPARTMENT_OTHER): Payer: Medicare Other | Admitting: Dietician

## 2023-06-01 ENCOUNTER — Encounter: Payer: Self-pay | Admitting: Dietician

## 2023-06-01 DIAGNOSIS — R7303 Prediabetes: Secondary | ICD-10-CM

## 2023-06-01 NOTE — Progress Notes (Signed)
 Patient was seen on 06/01/2023 for the Core Session 7 of Diabetes Prevention Program course at [facility]. By the end of this session patients are able to complete the following objectives:   Learning Objectives: Define calorie balance. Explain how healthy eating and being active are related in terms of calorie balance.  Describe the relationship between calorie balance and weight loss.  Describe his or her progress as it relates to calorie balance.  Develop an activity plan for the coming week.   Goals:  Record weight taken outside of class.  Track foods and beverages eaten each day in the "Food and Activity Tracker," including calories and fat grams for each item.   Track activity type, minutes you were active, and distance you reached each day in the "Food and Activity Tracker."  Set aside one 20 to 30-minute block of time every day or find two or more periods of 10 to15 minutes each for physical activity.  Make a Physical Activities Plan for the Week.  Make active lifestyle choices all through the day  Stay at or go slightly over activity goal.   Follow-Up Plan: Attend Core Session 8 next week.  Bring completed "Food and Activity Tracker" next week to be reviewed by Lifestyle Coach.

## 2023-06-04 DIAGNOSIS — E669 Obesity, unspecified: Secondary | ICD-10-CM | POA: Diagnosis not present

## 2023-06-04 DIAGNOSIS — I2510 Atherosclerotic heart disease of native coronary artery without angina pectoris: Secondary | ICD-10-CM | POA: Diagnosis not present

## 2023-06-04 DIAGNOSIS — E7800 Pure hypercholesterolemia, unspecified: Secondary | ICD-10-CM | POA: Diagnosis not present

## 2023-06-05 ENCOUNTER — Ambulatory Visit (HOSPITAL_BASED_OUTPATIENT_CLINIC_OR_DEPARTMENT_OTHER): Payer: Medicare Other | Attending: Internal Medicine | Admitting: Physical Therapy

## 2023-06-05 ENCOUNTER — Encounter (HOSPITAL_BASED_OUTPATIENT_CLINIC_OR_DEPARTMENT_OTHER): Payer: Self-pay | Admitting: Physical Therapy

## 2023-06-05 DIAGNOSIS — R293 Abnormal posture: Secondary | ICD-10-CM | POA: Insufficient documentation

## 2023-06-05 DIAGNOSIS — M6281 Muscle weakness (generalized): Secondary | ICD-10-CM | POA: Insufficient documentation

## 2023-06-05 DIAGNOSIS — R278 Other lack of coordination: Secondary | ICD-10-CM | POA: Diagnosis not present

## 2023-06-05 DIAGNOSIS — R2689 Other abnormalities of gait and mobility: Secondary | ICD-10-CM | POA: Diagnosis not present

## 2023-06-05 NOTE — Therapy (Signed)
 OUTPATIENT PHYSICAL THERAPY LOWER EXTREMITY TREATMENT                  Re-cert    Patient Name: Cole Martinez MRN: [id number] DOB:Sep 02, 1945, 78 y.o., male Today's Date: 06/05/2023  END OF SESSION:  PT End of Session - 06/05/23 1104     Visit Number 11    Number of Visits 24    Date for PT Re-Evaluation 07/20/23    Authorization Type MCR A and B    Progress Note Due on Visit 20    PT Start Time 1103    PT Stop Time 1144    PT Time Calculation (min) 41 min    Equipment Utilized During Treatment Gait belt    Activity Tolerance Patient tolerated treatment well    Behavior During Therapy WFL for tasks assessed/performed                Past Medical History:  Diagnosis Date   ADHD (attention deficit hyperactivity disorder)    B12 deficiency    BPH (benign prostatic hypertrophy)    Chronic lymphocytic leukemia (CLL), T-cell (HCC) DX 1996--  ONCOLOGIST-  DR Truett Perna   PT IS ASYMPTOMATIC--- LAST CBC W/ DIFF 06-24-2012 STABLE   Coronary artery disease CARDIOLOGIST- DR Verdis Prime   S/P STENTING LAD 1999 // Myoview 01/2019: EF 62, normal perfusion; Low Risk   Crohn's disease of ileum (HCC) SINCE 1988   ED (erectile dysfunction)    Elevated PSA    Essential and other specified forms of tremor 11/25/2012   Gait abnormality 05/17/2020   H/O adenomatous polyp of colon    Hyperlipidemia    Hypertension    Nocturia    OA (osteoarthritis)    Peripheral neuropathy    hx of, none current as of 08-04-13   Peyronie disease    S/P coronary artery stent placement OCT 1999 OF LAD   Tremor, hereditary, benign MILD RIGHT HAND   Past Surgical History:  Procedure Laterality Date   CARDIOVASCULAR STRESS TEST  11-01-2010 DR Verdis Prime   NORMAL PERFUSION STUDY/ EF 64%/ NO ISCHEMIA   cataract surgery  Bilateral feburary 2020   with lens placement    COLONOSCOPY WITH PROPOFOL N/A 09/24/2012   Procedure: COLONOSCOPY WITH PROPOFOL;  Surgeon: Charolett Bumpers, MD;  Location: [facility];   Service: Endoscopy;  Laterality: N/A;   CORONARY ANGIOPLASTY WITH STENT PLACEMENT  OCT 1999   STENT OF LAD   CORONARY STENT INTERVENTION N/A 02/14/2019   Procedure: CORONARY STENT INTERVENTION;  Surgeon: Lyn Records, MD;  Location: [facility];  Service: Cardiovascular;  Laterality: N/A;   CYSTOSCOPY WITH URETHRAL DILATATION  03/12/2017   Procedure: CYSTOSCOPY WITH URETHRAL DILATATION;  Surgeon: Ollen Gross, MD;  Location: [facility];  Service: Orthopedics;;  [name], Resident Assisting   CYSTOSCOPY WITH URETHRAL DILATATION N/A 10/03/2018   Procedure: CYSTOSCOPY WITH URETHRAL BALLOON DILATATION WITH BILATERAL RETROGRADE PYELOGRAPHY;  Surgeon: Heloise Purpura, MD;  Location: [facility];  Service: Urology;  Laterality: N/A;   CYSTOSCOPY WITH URETHRAL DILATATION N/A 12/30/2020   Procedure: CYSTOSCOPY WITH BALLOON DILATATION OF URETHRAL STRICTURE;  Surgeon: Heloise Purpura, MD;  Location: [facility];  Service: Urology;  Laterality: N/A;   LAPAROSCOPIC INGUINAL HERNIA REPAIR Bilateral 01-10-2004   W/ MESH   LEFT HEART CATH AND CORONARY ANGIOGRAPHY N/A 02/13/2019   Procedure: LEFT HEART CATH AND CORONARY ANGIOGRAPHY;  Surgeon: Lyn Records, MD;  Location: [facility];  Service: Cardiovascular;  Laterality: N/A;  neck benign removed from neck  yrs ago   PROSTATE BIOPSY N/A 07/12/2012   Procedure: PROSTATE BIOPSY AND ULTRASOUND;  Surgeon: Kathi Ludwig, MD;  Location: [facility];  Service: Urology;  Laterality: N/A;   PROSTATE SURGERY  2002   tuna   REMOVAL LEFT NECK LYMPH NODE  1996   TONSILLECTOMY  CHILD   TOTAL KNEE ARTHROPLASTY Right 03/12/2017   Procedure: RIGHT TOTAL KNEE ARTHROPLASTY;  Surgeon: Ollen Gross, MD;  Location: [facility];  Service: Orthopedics;  Laterality: Right;  Adductor Block   TRANSURETHRAL RESECTION OF PROSTATE N/A 08/08/2013   Procedure: TRANSURETHRAL RESECTION OF THE PROSTATE WITH GYRUS INSTRUMENTS;  Surgeon: Kathi Ludwig, MD;  Location: [facility];  Service: Urology;  Laterality: N/A;   TRANSURETHRAL RESECTION OF PROSTATE     Patient Active Problem List   Diagnosis Date Noted   Immunization counseling 12/20/2022   Cellulitis 12/20/2022   Medication management 12/20/2022   Adult attention deficit disorder 06/04/2020   Chronic lymphocytic leukemia (HCC) 06/04/2020   Conductive hearing loss, bilateral 06/04/2020   Crohn's ileitis (HCC) 06/04/2020   Disorder of musculoskeletal system 06/04/2020   Erectile dysfunction 06/04/2020   Gastroesophageal reflux disease 06/04/2020   Gout 06/04/2020   History of adenomatous polyp of colon 06/04/2020   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   Hypothyroidism 06/04/2020   Increased frequency of urination 06/04/2020   Male hypogonadism 06/04/2020   Muscle pain 06/04/2020   Nocturia 06/04/2020   Obesity 06/04/2020   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   Osteopenia 06/04/2020   Peripheral neurogenic pain 06/04/2020   Recurrent falls 06/04/2020   Unspecified kyphosis, thoracic region 06/04/2020   Vitamin B12 deficiency 06/04/2020   Vitamin D deficiency 06/04/2020   Gait abnormality 05/17/2020   Ataxia 03/16/2020   Angina pectoris (HCC) 02/13/2019   Pseudophakia of both eyes 08/01/2018   History of total knee arthroplasty 03/29/2017   Stiffness of right knee 03/16/2017   OA (osteoarthritis) of knee 03/12/2017   Tremor, essential 12/03/2015   Essential hypertension 12/09/2014   CAD (coronary artery disease) 12/08/2013   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   Pure hypercholesterolemia 12/08/2013   Benign prostatic hyperplasia 08/08/2013   Essential and other specified forms of tremor 11/25/2012    PCP: Emilio Aspen, MD  REFERRING PROVIDER: Emilio Aspen, MD  REFERRING DIAG: R26.89  Balance disorder              R26.81  Gait Instability    M62.81 lower extremity weakness  THERAPY DIAG:  Other abnormalities of gait and mobility - Plan:  PT plan of care cert/re-cert  Other lack of coordination - Plan: PT plan of care cert/re-cert  Muscle weakness (generalized) - Plan: PT plan of care cert/re-cert  Abnormal posture - Plan: PT plan of care cert/re-cert  Rationale for Evaluation and Treatment: Rehabilitation  ONSET DATE: PT order 03/20/2023  SUBJECTIVE:   SUBJECTIVE STATEMENT: Pt states she is slowly getting better.   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   PRECAUTIONS: Fall and Other: R TKA, Hx of CLL   WEIGHT BEARING RESTRICTIONS: No  FALLS:  Has patient fallen in last 6 months? Yes. Number of falls 1 when slipping on ice  LIVING ENVIRONMENT: Lives  with: lives with their spouse Lives in: 2 story home with rails Stairs: yes Has following equipment at home:  cane, FWW  OCCUPATION: IT trainer, still works some  PLOF: Independent  PATIENT GOALS:  improve strength, feel more comfortable with walking, not feel that he is going to fall  OBJECTIVE:  Note: Objective measures were completed at Evaluation unless otherwise noted.  DIAGNOSTIC FINDINGS: N/A  LOWER EXTREMITY MMT:   MMT Right eval Left eval 06/05/23 right  Hip flexion 4+/5 5/5 4+ to 5-  Hip extension       Hip abduction 24.4 23.3   Hip adduction       Hip internal rotation       Hip external rotation       Knee flexion 5/5 seated 5/5 seated   Knee extension 5/5 5/5   Ankle dorsiflexion       Ankle plantarflexion       Ankle inversion       Ankle eversion        (Blank rows = not tested)     FUNCTIONAL TESTS:  TUG:  13.73 sec with cane.  Pt didn't control his lowering to the chair and had some instability with turning requiring assistance from PT. 5x STS:  16.4 sec without Ue's.  Pt did use legs  on the back of the chair.     Balance:  Romberg:  > 30 sec without UE support.                 Unable to perform tandem stance                 Staggered stance:  L LE back:  30 sec, R LE back:  30 sec though had increased sway. Pt had a tremor in R UE at times with balance testing.  06/05/23 5x STS: completed from bench at pool: 16s without UE with legs leaning on chair.  BERG Balance Test          Date: 06/05/23  Sit to Stand 3  Standing unsupported 4  Sitting with back unsupported but feet supported 4  Stand to sit  2  Transfers  3  Standing unsupported with eyes closed 1  Standing unsupported feet together 1  From standing position, reach forward with outstretched arm 3  From standing position, pick up object from floor 3  From standing position, turn and look behind over each shoulder 1  Turn 360 1  Standing unsupported, alternately place foot on step 0  Standing unsupported, one foot in front 0  Standing on one leg 0  Total:  26/56                                                                                                                                  TREATMENT DATE:    DATE: 06/05/23 Pt seen for aquatic therapy today.  Treatment took place in water 3.5-4.75 ft  in depth at the [facility] pool. Temp of water was 91.  Pt entered/exited the pool via stairs with hand rail.   *walking forward, back and side stepping ue support barbel.   *ue support yellow noodle->unsupported: pivoting R/L forward and back ward amb 180d and 90d turning  *sts from 3rd water step x5 *hip add sets using bb 5 *STS with adductor set from 3rd step CGA 4/5reps *scapular retraction using riderband 2 x 12 elbows straight then bent *tandem stance leading R/L UE add/abd using RBHB in 4.0 ft(good challenge)  encouraged x 5 consecutive reps  Pt requires the buoyancy and hydrostatic pressure of water for support, and to offload joints by unweighting joint load by at least 50 % in navel deep  water and by at least 75-80% in chest to neck deep water.  Viscosity of the water is needed for resistance of strengthening. Water current perturbations provides challenge to standing balance requiring increased core activation.  Berg and 5 x STS completed    Last Land Nustep L6 bilat UE/LE's x 5 mins  Standing on airex with FT 2x30 sec with min to mod assist for balance. One occasion of max assist with LOB Staggered standing on airex 2x30 sec each with CGA Marching on airex 2 x 1 min Tandem gait with UE support on rail and CGA   Step ups 2x10 bilat with rail on 6 inch step with CGA  Lateral step ups with rail on 6 inch step 2x10   PATIENT EDUCATION:  Education details: relevant anatomy, exercise form, HEP, POC, dx, and rationale of interventions. Person educated: Patient Education method: Explanation, demonstration, verbal and tactile cues Education comprehension: verbalized understanding, returned demonstration, verbal and tactile cues required  HOME EXERCISE PROGRAM: Access Code: [email or web address] URL: [email or web address] Date: 04/23/2023 Prepared by: Aaron Edelman   ASSESSMENT:  CLINICAL IMPRESSION: Pt re-cert completed.  Continued to focus on balance retraining.  Berg and 5x STS completed.  Good toleration.  Pt will continue with therapy for 6 more weeks alternating land and water to continue to progress towards land based goals.   PN: pt seen in aquatics.  He has progressed in all areas while submerged.  He is turning with good stability and performing tandem stance >20s.  Able to progress static tandem exercise to dynamic with good challenge.  He has not had a fall since beginning therapy. He has been compliant with HEP (other than while ill) and continues to ride stationary bike 40 min 2 x weekly. He does continue to struggle with standing dynamic balance on land particularly with turning and transitional movements. He will continue to benefit from skilled PT  intervention in both settings to further progress toward all land based goals as written.    OBJECTIVE IMPAIRMENTS: Abnormal gait, decreased activity tolerance, decreased balance, decreased coordination, decreased endurance, decreased mobility, difficulty walking, decreased strength, and postural dysfunction.   ACTIVITY LIMITATIONS: carrying, lifting, standing, squatting, and locomotion level  PARTICIPATION LIMITATIONS: shopping and community activity  PERSONAL FACTORS: Time since onset of injury/illness/exacerbation and 3+ comorbidities: FXAS, R TKA, peripheral neuropathy, CLL  are also affecting patient's functional outcome.   REHAB POTENTIAL: Good  CLINICAL DECISION MAKING: Evolving/moderate complexity  EVALUATION COMPLEXITY: Moderate   GOALS:  SHORT TERM GOALS: Target date: 05/11/2023  Pt will be independent with HEP for improved balance, strength, and functional mobility. Baseline: Goal status: In progress 05/24/23  2.  Pt will demo improved stability with turning with gait.  Baseline:  Goal status: In progress  05/24/23  3.  Pt will perform TUG safely including turning and and performing stand to sit transfer with good control and without assistance.  Baseline:  Goal status: In progress 05/24/23  4.  Pt will be able to perform tandem stance for at least 20 seconds for improved balance and stability with daily activities.  Baseline:  Goal status: in progress 05/24/23 Target date:  05/18/2023   LONG TERM GOALS: Target date: 06/01/2023   Pt will demo improved time on 5x STS test by 3 seconds and also improved form by not using his legs on the back of the chair for improved functional LE strength and performance of transfers.  Baseline:  Goal status: Un Progress 06/05/23  2.  Pt will demo improved R hip flexion strength to 5/5 MMT and bilat hip abd strength by at least 6-8 lbs for improved strength to perform functional mobility with increased ease and less difficulty.   Baseline:  Goal status: In progress 06/05/23  3.  (Will set Berg goal next visit after testing. )Pt will improve on Berg balance test to >/= 36/56 to demonstrate a decrease in fall risk. Baseline: 26/56 06/05/23 Goal status: in progress 06/05/23  4.  Pt will report at least a 70% improvement in stability with gait and community ambulation.  Baseline: 06/05/23 5% Goal status: In progress 06/05/23  5.  Pt will report improved confidence with his daily functional mobility.  Baseline:  Goal status: INITIAL     PLAN:  PT FREQUENCY: 2x/week  PT DURATION: 6 weeks  PLANNED INTERVENTIONS: 97164- PT Re-evaluation, 97110-Therapeutic exercises, 97530- Therapeutic activity, [id number]- Neuromuscular re-education, 97535- Self Care, [id number]- Manual therapy, [phone]- Gait training, [phone]- Aquatic Therapy, Patient/Family education, Balance training, Stair training, and Taping  PLAN FOR NEXT SESSION: Cont with Balance and Gait training and strengthening.  Cont with aquatic and land based PT.   Corrie Dandy Mahomet) Samora Jernberg MPT 06/05/23 12:46 PM [facility] [address] [place], [place], [address] Phone: [phone]   Fax:  [phone]

## 2023-06-07 ENCOUNTER — Other Ambulatory Visit: Payer: Self-pay | Admitting: Neurology

## 2023-06-07 ENCOUNTER — Encounter (HOSPITAL_BASED_OUTPATIENT_CLINIC_OR_DEPARTMENT_OTHER): Payer: Self-pay | Admitting: Physical Therapy

## 2023-06-07 ENCOUNTER — Ambulatory Visit (HOSPITAL_BASED_OUTPATIENT_CLINIC_OR_DEPARTMENT_OTHER): Payer: Medicare Other | Admitting: Physical Therapy

## 2023-06-07 DIAGNOSIS — R278 Other lack of coordination: Secondary | ICD-10-CM

## 2023-06-07 DIAGNOSIS — R293 Abnormal posture: Secondary | ICD-10-CM | POA: Diagnosis not present

## 2023-06-07 DIAGNOSIS — R2689 Other abnormalities of gait and mobility: Secondary | ICD-10-CM | POA: Diagnosis not present

## 2023-06-07 DIAGNOSIS — M6281 Muscle weakness (generalized): Secondary | ICD-10-CM | POA: Diagnosis not present

## 2023-06-07 NOTE — Therapy (Signed)
 OUTPATIENT PHYSICAL THERAPY LOWER EXTREMITY TREATMENT                      Patient Name: Cole Martinez MRN: [id number] DOB:02/19/1946, 78 y.o., male Today's Date: 06/08/2023  END OF SESSION:  PT End of Session - 06/07/23 1149     Visit Number 12    Number of Visits 24    Date for PT Re-Evaluation 07/20/23    Authorization Type MCR A and B    PT Start Time 1146    PT Stop Time 1228    PT Time Calculation (min) 42 min    Equipment Utilized During Treatment Gait belt    Activity Tolerance Patient tolerated treatment well    Behavior During Therapy WFL for tasks assessed/performed                Past Medical History:  Diagnosis Date   ADHD (attention deficit hyperactivity disorder)    B12 deficiency    BPH (benign prostatic hypertrophy)    Chronic lymphocytic leukemia (CLL), T-cell (HCC) DX 1996--  ONCOLOGIST-  DR Truett Perna   PT IS ASYMPTOMATIC--- LAST CBC W/ DIFF 06-24-2012 STABLE   Coronary artery disease CARDIOLOGIST- DR Verdis Prime   S/P STENTING LAD 1999 // Myoview 01/2019: EF 62, normal perfusion; Low Risk   Crohn's disease of ileum (HCC) SINCE 1988   ED (erectile dysfunction)    Elevated PSA    Essential and other specified forms of tremor 11/25/2012   Gait abnormality 05/17/2020   H/O adenomatous polyp of colon    Hyperlipidemia    Hypertension    Nocturia    OA (osteoarthritis)    Peripheral neuropathy    hx of, none current as of 08-04-13   Peyronie disease    S/P coronary artery stent placement OCT 1999 OF LAD   Tremor, hereditary, benign MILD RIGHT HAND   Past Surgical History:  Procedure Laterality Date   CARDIOVASCULAR STRESS TEST  11-01-2010 DR Verdis Prime   NORMAL PERFUSION STUDY/ EF 64%/ NO ISCHEMIA   cataract surgery  Bilateral feburary 2020   with lens placement    COLONOSCOPY WITH PROPOFOL N/A 09/24/2012   Procedure: COLONOSCOPY WITH PROPOFOL;  Surgeon: Charolett Bumpers, MD;  Location: [facility];  Service: Endoscopy;  Laterality: N/A;    CORONARY ANGIOPLASTY WITH STENT PLACEMENT  OCT 1999   STENT OF LAD   CORONARY STENT INTERVENTION N/A 02/14/2019   Procedure: CORONARY STENT INTERVENTION;  Surgeon: Lyn Records, MD;  Location: [facility];  Service: Cardiovascular;  Laterality: N/A;   CYSTOSCOPY WITH URETHRAL DILATATION  03/12/2017   Procedure: CYSTOSCOPY WITH URETHRAL DILATATION;  Surgeon: Ollen Gross, MD;  Location: [facility];  Service: Orthopedics;;  [name], Resident Assisting   CYSTOSCOPY WITH URETHRAL DILATATION N/A 10/03/2018   Procedure: CYSTOSCOPY WITH URETHRAL BALLOON DILATATION WITH BILATERAL RETROGRADE PYELOGRAPHY;  Surgeon: Heloise Purpura, MD;  Location: [facility];  Service: Urology;  Laterality: N/A;   CYSTOSCOPY WITH URETHRAL DILATATION N/A 12/30/2020   Procedure: CYSTOSCOPY WITH BALLOON DILATATION OF URETHRAL STRICTURE;  Surgeon: Heloise Purpura, MD;  Location: [facility];  Service: Urology;  Laterality: N/A;   LAPAROSCOPIC INGUINAL HERNIA REPAIR Bilateral 01-10-2004   W/ MESH   LEFT HEART CATH AND CORONARY ANGIOGRAPHY N/A 02/13/2019   Procedure: LEFT HEART CATH AND CORONARY ANGIOGRAPHY;  Surgeon: Lyn Records, MD;  Location: [facility];  Service: Cardiovascular;  Laterality: N/A;   neck benign removed from neck  yrs  ago   PROSTATE BIOPSY N/A 07/12/2012   Procedure: PROSTATE BIOPSY AND ULTRASOUND;  Surgeon: Kathi Ludwig, MD;  Location: [facility];  Service: Urology;  Laterality: N/A;   PROSTATE SURGERY  2002   tuna   REMOVAL LEFT NECK LYMPH NODE  1996   TONSILLECTOMY  CHILD   TOTAL KNEE ARTHROPLASTY Right 03/12/2017   Procedure: RIGHT TOTAL KNEE ARTHROPLASTY;  Surgeon: Ollen Gross, MD;  Location: [facility];  Service: Orthopedics;  Laterality: Right;  Adductor Block   TRANSURETHRAL RESECTION OF PROSTATE N/A 08/08/2013   Procedure: TRANSURETHRAL RESECTION OF THE PROSTATE WITH GYRUS INSTRUMENTS;  Surgeon: Kathi Ludwig, MD;  Location: [facility];  Service: Urology;  Laterality: N/A;    TRANSURETHRAL RESECTION OF PROSTATE     Patient Active Problem List   Diagnosis Date Noted   Immunization counseling 12/20/2022   Cellulitis 12/20/2022   Medication management 12/20/2022   Adult attention deficit disorder 06/04/2020   Chronic lymphocytic leukemia (HCC) 06/04/2020   Conductive hearing loss, bilateral 06/04/2020   Crohn's ileitis (HCC) 06/04/2020   Disorder of musculoskeletal system 06/04/2020   Erectile dysfunction 06/04/2020   Gastroesophageal reflux disease 06/04/2020   Gout 06/04/2020   History of adenomatous polyp of colon 06/04/2020   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   Hypothyroidism 06/04/2020   Increased frequency of urination 06/04/2020   Male hypogonadism 06/04/2020   Muscle pain 06/04/2020   Nocturia 06/04/2020   Obesity 06/04/2020   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   Osteopenia 06/04/2020   Peripheral neurogenic pain 06/04/2020   Recurrent falls 06/04/2020   Unspecified kyphosis, thoracic region 06/04/2020   Vitamin B12 deficiency 06/04/2020   Vitamin D deficiency 06/04/2020   Gait abnormality 05/17/2020   Ataxia 03/16/2020   Angina pectoris (HCC) 02/13/2019   Pseudophakia of both eyes 08/01/2018   History of total knee arthroplasty 03/29/2017   Stiffness of right knee 03/16/2017   OA (osteoarthritis) of knee 03/12/2017   Tremor, essential 12/03/2015   Essential hypertension 12/09/2014   CAD (coronary artery disease) 12/08/2013   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   Pure hypercholesterolemia 12/08/2013   Benign prostatic hyperplasia 08/08/2013   Essential and other specified forms of tremor 11/25/2012    PCP: Emilio Aspen, MD  REFERRING PROVIDER: Emilio Aspen, MD  REFERRING DIAG: R26.89  Balance disorder              R26.81  Gait Instability    M62.81 lower extremity weakness  THERAPY DIAG:  Other abnormalities of gait and mobility  Other lack of coordination  Muscle weakness  (generalized)  Rationale for Evaluation and Treatment: Rehabilitation  ONSET DATE: PT order 03/20/2023  SUBJECTIVE:   SUBJECTIVE STATEMENT: "Some days I feel really comfortable, I don't worry about my balance, but other days I do."  Pt denies any adverse effects after prior Rx.  Pt is going to a clinic in June that specializes in [place].  Pt is going to the gym 3x/wk.  He is compliant with HEP.    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   PRECAUTIONS: Fall and Other: R TKA, Hx of CLL   WEIGHT BEARING RESTRICTIONS: No  FALLS:  Has patient fallen in last 6 months? Yes. Number of falls  1 when slipping on ice  LIVING ENVIRONMENT: Lives with: lives with their spouse Lives in: 2 story home with rails Stairs: yes Has following equipment at home:  cane, FWW  OCCUPATION: IT trainer, still works some  PLOF: Independent  PATIENT GOALS:  improve strength, feel more comfortable with walking, not feel that he is going to fall  OBJECTIVE:  Note: Objective measures were completed at Evaluation unless otherwise noted.  DIAGNOSTIC FINDINGS: N/A  LOWER EXTREMITY MMT:   MMT Right eval Left eval 06/05/23 right 06/07/23 right 06/07/23 left  Hip flexion 4+/5 5/5 4+ to 5-    Hip extension         Hip abduction 24.4 23.3  33.3 34.8  Hip adduction         Hip internal rotation         Hip external rotation         Knee flexion 5/5 seated 5/5 seated     Knee extension 5/5 5/5     Ankle dorsiflexion         Ankle plantarflexion         Ankle inversion         Ankle eversion          (Blank rows = not tested)     FUNCTIONAL TESTS:  TUG:  13.73 sec with cane.  Pt didn't control his lowering to the chair and had some instability with turning requiring  assistance from PT.  TUG on 06/07/23:  10.14  sec 5x STS:  16.4 sec without Ue's.  Pt did use legs on the back of the chair.     Balance:  Romberg:  > 30 sec without UE support.                 Unable to perform tandem stance                 Staggered stance:  L LE back:  30 sec, R LE back:  30 sec though had increased sway. Pt had a tremor in R UE at times with balance testing.  06/05/23 5x STS: completed from bench at pool: 16s without UE with legs leaning on chair.  BERG Balance Test          Date: 06/05/23  Sit to Stand 3  Standing unsupported 4  Sitting with back unsupported but feet supported 4  Stand to sit  2  Transfers  3  Standing unsupported with eyes closed 1  Standing unsupported feet together 1  From standing position, reach forward with outstretched arm 3  From standing position, pick up object from floor 3  From standing position, turn and look behind over each shoulder 1  Turn 360 1  Standing unsupported, alternately place foot on step 0  Standing unsupported, one foot in front 0  Standing on one leg 0  Total:  26/56  TREATMENT DATE:   06/07/2023 Assessed hip abd strength and TUG test.  See above.  ABC scale:  Initial/Current: 75% / 75% indicated pt being limited with activities  Step ups on 6 inch step with CGA and 1 rail assist x 10 reps bilat Lateral step ups on 6 inch step with UE assist and SBA/CGA  Airex marches with 1 Ue assist and CGA/min assist Standing on airex x 30 sec with NBOS Reaching while standing on airex x 10 with FA and with NBOS with assistance for LOB Stepping over hurdles (4) with rail assist with a step to gait and reciprocal gait pattern with CGA/min assist and one occasion of max assist for LOB    DATE: 06/05/23 Pt seen for aquatic therapy today.  Treatment took place in water 3.5-4.75 ft in depth at the  [facility] pool. Temp of water was 91.  Pt entered/exited the pool via stairs with hand rail.   *walking forward, back and side stepping ue support barbel.   *ue support yellow noodle->unsupported: pivoting R/L forward and back ward amb 180d and 90d turning  *sts from 3rd water step x5 *hip add sets using bb 5 *STS with adductor set from 3rd step CGA 4/5reps *scapular retraction using riderband 2 x 12 elbows straight then bent *tandem stance leading R/L UE add/abd using RBHB in 4.0 ft(good challenge)  encouraged x 5 consecutive reps  Pt requires the buoyancy and hydrostatic pressure of water for support, and to offload joints by unweighting joint load by at least 50 % in navel deep water and by at least 75-80% in chest to neck deep water.  Viscosity of the water is needed for resistance of strengthening. Water current perturbations provides challenge to standing balance requiring increased core activation.  Berg and 5 x STS completed    PATIENT EDUCATION:  Education details: relevant anatomy, exercise form, HEP, POC, dx, and rationale of interventions. Person educated: Patient Education method: Explanation, demonstration, verbal and tactile cues Education comprehension: verbalized understanding, returned demonstration, verbal and tactile cues required  HOME EXERCISE PROGRAM: Access Code: [email or web address] URL: [email or web address] Date: 04/23/2023 Prepared by: Aaron Edelman   ASSESSMENT:  CLINICAL IMPRESSION: PT assessed strength and TUG test today.  Pt demonstrates a good improvement in bilat hip abduction strength.  Pt also demonstrates a 3.6 second improvement in TUG.  Pt partially met LTG #2.  Pt had no change in ABC scale.  Pt had more difficulty with balance today with exercises.  He had increased LOB with exercises including step ups requiring increased assistance to correct LOB.  Pt had increased difficulty with R LE control.  Pt responded well to Rx having no  c/o's after Rx.  He should benefit from cont skilled PT consisting of land based and aquatic therapy.   OBJECTIVE IMPAIRMENTS: Abnormal gait, decreased activity tolerance, decreased balance, decreased coordination, decreased endurance, decreased mobility, difficulty walking, decreased strength, and postural dysfunction.   ACTIVITY LIMITATIONS: carrying, lifting, standing, squatting, and locomotion level  PARTICIPATION LIMITATIONS: shopping and community activity  PERSONAL FACTORS: Time since onset of injury/illness/exacerbation and 3+ comorbidities: FXAS, R TKA, peripheral neuropathy, CLL  are also affecting patient's functional outcome.   REHAB POTENTIAL: Good  CLINICAL DECISION MAKING: Evolving/moderate complexity  EVALUATION COMPLEXITY: Moderate   GOALS:  SHORT TERM GOALS: Target date: 05/11/2023  Pt will be independent with HEP for improved balance, strength, and functional mobility. Baseline: Goal status: In progress 05/24/23  2.  Pt will demo improved stability with turning with gait.  Baseline:  Goal status: In progress 05/24/23  3.  Pt will perform TUG safely including turning and and performing stand to sit transfer with good control and without assistance.  Baseline:  Goal status: PROGRESSING  06/07/23  4.  Pt will be able to perform tandem stance for at least 20 seconds for improved balance and stability with daily activities.  Baseline:  Goal status: in progress 05/24/23 Target date:  05/18/2023   LONG TERM GOALS: Target date: 06/01/2023   Pt will demo improved time on 5x STS test by 3 seconds and also improved form by not using his legs on the back of the chair for improved functional LE strength and performance of transfers.  Baseline:  Goal status: Un Progress 06/05/23  2.  Pt will demo improved R hip flexion strength to 5/5 MMT and bilat hip abd strength by at least 6-8 lbs for improved strength to perform functional mobility with increased ease and less  difficulty.  Baseline:  Goal status: 66% MET   3.  (Will set Berg goal next visit after testing. )Pt will improve on Berg balance test to >/= 36/56 to demonstrate a decrease in fall risk. Baseline: 26/56 06/05/23 Goal status: in progress 06/05/23  4.  Pt will report at least a 70% improvement in stability with gait and community ambulation.  Baseline: 06/05/23 5% Goal status: In progress 06/05/23  5.  Pt will report improved confidence with his daily functional mobility.  Baseline:  Goal status: INITIAL     PLAN:  PT FREQUENCY: 2x/week  PT DURATION: 6 weeks  PLANNED INTERVENTIONS: 97164- PT Re-evaluation, 97110-Therapeutic exercises, 97530- Therapeutic activity, [id number]- Neuromuscular re-education, 97535- Self Care, 21308- Manual therapy, [phone]- Gait training, [phone]- Aquatic Therapy, Patient/Family education, Balance training, Stair training, and Taping  PLAN FOR NEXT SESSION: Cont with Balance and Gait training and strengthening.  Cont with aquatic and land based PT.   Audie Clear III PT, DPT 06/08/23 12:40 PM

## 2023-06-08 ENCOUNTER — Encounter: Payer: Self-pay | Admitting: Dietician

## 2023-06-08 ENCOUNTER — Encounter: Payer: Medicare Other | Attending: Internal Medicine | Admitting: Dietician

## 2023-06-08 DIAGNOSIS — B351 Tinea unguium: Secondary | ICD-10-CM | POA: Diagnosis not present

## 2023-06-08 DIAGNOSIS — R7303 Prediabetes: Secondary | ICD-10-CM | POA: Insufficient documentation

## 2023-06-08 DIAGNOSIS — L300 Nummular dermatitis: Secondary | ICD-10-CM | POA: Diagnosis not present

## 2023-06-08 NOTE — Progress Notes (Signed)
 Patient was seen on 06/08/2023 for the Core Session 8 of Diabetes Prevention Program course at [facility]. By the end of this session patients are able to complete the following objectives:   Learning Objectives: Recognize positive and negative food and activity cues.  Change negative food and activity cues to positive cues.  Add positive cues for activity and eliminate cues for inactivity.  Develop a plan for removing one problem food cue for the coming week.   Goals:  Record weight taken outside of class.  Track foods and beverages eaten each day in the "Food and Activity Tracker," including calories and fat grams for each item.   Track activity type, minutes you were active, and distance you reached each day in the "Food and Activity Tracker."  Set aside one 20 to 30-minute block of time every day or find two or more periods of 10 to15 minutes each for physical activity.  Remove one problem food cue.  Add one positive cue for being more active.  Follow-Up Plan: Attend Core Session 9 next week.  Bring completed "Food and Activity Tracker" next week to be reviewed by Lifestyle Coach.

## 2023-06-15 ENCOUNTER — Encounter: Payer: Self-pay | Admitting: Dietician

## 2023-06-15 ENCOUNTER — Encounter (HOSPITAL_BASED_OUTPATIENT_CLINIC_OR_DEPARTMENT_OTHER): Payer: Medicare Other | Admitting: Dietician

## 2023-06-15 DIAGNOSIS — R7303 Prediabetes: Secondary | ICD-10-CM

## 2023-06-15 NOTE — Progress Notes (Signed)
 Patient was seen on 06/15/2023 for the Core Session 9 of Diabetes Prevention Program course at [facility]. By the end of this session patients are able to complete the following objectives:   Learning Objectives: List and describe five steps to problem solving.  Apply the five problem solving steps to resolve a problem he or she has with eating less fat and fewer calories or being more active.   Goals:  Record weight taken outside of class.  Track foods and beverages eaten each day in the "Food and Activity Tracker," including calories and fat grams for each item.   Track activity type, minutes you were active, and distance you reached each day in the "Food and Activity Tracker."  Set aside one 20 to 30-minute block of time every day or find two or more periods of 10 to15 minutes each for physical activity.  Use problem solving action plan created during session to problem solve.   Follow-Up Plan: Attend Core Session 10 next week.  Bring completed "Food and Activity Tracker" next week to be reviewed by Lifestyle Coach. Bring menus from favorite restaurants to next session for future discussion.

## 2023-06-24 ENCOUNTER — Other Ambulatory Visit: Payer: Self-pay | Admitting: Internal Medicine

## 2023-06-27 DIAGNOSIS — Z79899 Other long term (current) drug therapy: Secondary | ICD-10-CM | POA: Diagnosis not present

## 2023-06-27 DIAGNOSIS — F064 Anxiety disorder due to known physiological condition: Secondary | ICD-10-CM | POA: Diagnosis not present

## 2023-06-27 DIAGNOSIS — Q992 Fragile X chromosome: Secondary | ICD-10-CM | POA: Diagnosis not present

## 2023-06-27 DIAGNOSIS — R413 Other amnesia: Secondary | ICD-10-CM | POA: Diagnosis not present

## 2023-06-27 DIAGNOSIS — R202 Paresthesia of skin: Secondary | ICD-10-CM | POA: Diagnosis not present

## 2023-06-27 DIAGNOSIS — G3189 Other specified degenerative diseases of nervous system: Secondary | ICD-10-CM | POA: Diagnosis not present

## 2023-06-27 DIAGNOSIS — Z1589 Genetic susceptibility to other disease: Secondary | ICD-10-CM | POA: Diagnosis not present

## 2023-06-28 ENCOUNTER — Ambulatory Visit (HOSPITAL_BASED_OUTPATIENT_CLINIC_OR_DEPARTMENT_OTHER): Admitting: Physical Therapy

## 2023-06-28 ENCOUNTER — Encounter (HOSPITAL_BASED_OUTPATIENT_CLINIC_OR_DEPARTMENT_OTHER): Payer: Self-pay | Admitting: Physical Therapy

## 2023-06-28 DIAGNOSIS — R278 Other lack of coordination: Secondary | ICD-10-CM | POA: Diagnosis not present

## 2023-06-28 DIAGNOSIS — R293 Abnormal posture: Secondary | ICD-10-CM | POA: Diagnosis not present

## 2023-06-28 DIAGNOSIS — R2689 Other abnormalities of gait and mobility: Secondary | ICD-10-CM | POA: Diagnosis not present

## 2023-06-28 DIAGNOSIS — M6281 Muscle weakness (generalized): Secondary | ICD-10-CM

## 2023-06-28 NOTE — Therapy (Signed)
 OUTPATIENT PHYSICAL THERAPY LOWER EXTREMITY TREATMENT                      Patient Name: Cole Martinez MRN: [id number] DOB:September 06, 1945, 78 y.o., male Today's Date: 06/29/2023  END OF SESSION:  PT End of Session - 06/28/23 1110     Visit Number 13    Number of Visits 24    Date for PT Re-Evaluation 07/20/23    Authorization Type MCR A and B    PT Start Time 1107    PT Stop Time 1152    PT Time Calculation (min) 45 min    Activity Tolerance Patient tolerated treatment well    Behavior During Therapy WFL for tasks assessed/performed                Past Medical History:  Diagnosis Date   ADHD (attention deficit hyperactivity disorder)    B12 deficiency    BPH (benign prostatic hypertrophy)    Chronic lymphocytic leukemia (CLL), T-cell (HCC) DX 1996--  ONCOLOGIST-  DR Scherrie Curt   PT IS ASYMPTOMATIC--- LAST CBC W/ DIFF 06-24-2012 STABLE   Coronary artery disease CARDIOLOGIST- DR Kay Parson   S/P STENTING LAD 1999 // Myoview  01/2019: EF 62, normal perfusion; Low Risk   Crohn's disease of ileum (HCC) SINCE 1988   ED (erectile dysfunction)    Elevated PSA    Essential and other specified forms of tremor 11/25/2012   Gait abnormality 05/17/2020   H/O adenomatous polyp of colon    Hyperlipidemia    Hypertension    Nocturia    OA (osteoarthritis)    Peripheral neuropathy    hx of, none current as of 08-04-13   Peyronie disease    S/P coronary artery stent placement OCT 1999 OF LAD   Tremor, hereditary, benign MILD RIGHT HAND   Past Surgical History:  Procedure Laterality Date   CARDIOVASCULAR STRESS TEST  11-01-2010 DR Kay Parson   NORMAL PERFUSION STUDY/ EF 64%/ NO ISCHEMIA   cataract surgery  Bilateral feburary 2020   with lens placement    COLONOSCOPY WITH PROPOFOL  N/A 09/24/2012   Procedure: COLONOSCOPY WITH PROPOFOL ;  Surgeon: Garrett Kallman, MD;  Location: [facility];  Service: Endoscopy;  Laterality: N/A;   CORONARY ANGIOPLASTY WITH STENT PLACEMENT  OCT  1999   STENT OF LAD   CORONARY STENT INTERVENTION N/A 02/14/2019   Procedure: CORONARY STENT INTERVENTION;  Surgeon: Arty Binning, MD;  Location: [facility];  Service: Cardiovascular;  Laterality: N/A;   CYSTOSCOPY WITH URETHRAL DILATATION  03/12/2017   Procedure: CYSTOSCOPY WITH URETHRAL DILATATION;  Surgeon: Liliane Rei, MD;  Location: [facility];  Service: Orthopedics;;  Arden Kotyk, Resident Assisting   CYSTOSCOPY WITH URETHRAL DILATATION N/A 10/03/2018   Procedure: CYSTOSCOPY WITH URETHRAL BALLOON DILATATION WITH BILATERAL RETROGRADE PYELOGRAPHY;  Surgeon: Florencio Hunting, MD;  Location: [facility];  Service: Urology;  Laterality: N/A;   CYSTOSCOPY WITH URETHRAL DILATATION N/A 12/30/2020   Procedure: CYSTOSCOPY WITH BALLOON DILATATION OF URETHRAL STRICTURE;  Surgeon: Florencio Hunting, MD;  Location: [facility];  Service: Urology;  Laterality: N/A;   LAPAROSCOPIC INGUINAL HERNIA REPAIR Bilateral 01-10-2004   W/ MESH   LEFT HEART CATH AND CORONARY ANGIOGRAPHY N/A 02/13/2019   Procedure: LEFT HEART CATH AND CORONARY ANGIOGRAPHY;  Surgeon: Arty Binning, MD;  Location: [facility];  Service: Cardiovascular;  Laterality: N/A;   neck benign removed from neck  yrs ago   PROSTATE BIOPSY N/A 07/12/2012  Procedure: PROSTATE BIOPSY AND ULTRASOUND;  Surgeon: Edmund Gouge, MD;  Location: [facility];  Service: Urology;  Laterality: N/A;   PROSTATE SURGERY  2002   tuna   REMOVAL LEFT NECK LYMPH NODE  1996   TONSILLECTOMY  CHILD   TOTAL KNEE ARTHROPLASTY Right 03/12/2017   Procedure: RIGHT TOTAL KNEE ARTHROPLASTY;  Surgeon: Liliane Rei, MD;  Location: [facility];  Service: Orthopedics;  Laterality: Right;  Adductor Block   TRANSURETHRAL RESECTION OF PROSTATE N/A 08/08/2013   Procedure: TRANSURETHRAL RESECTION OF THE PROSTATE WITH GYRUS INSTRUMENTS;  Surgeon: Edmund Gouge, MD;  Location: [facility];  Service: Urology;  Laterality: N/A;   TRANSURETHRAL RESECTION OF PROSTATE      Patient Active Problem List   Diagnosis Date Noted   Immunization counseling 12/20/2022   Cellulitis 12/20/2022   Medication management 12/20/2022   Adult attention deficit disorder 06/04/2020   Chronic lymphocytic leukemia (HCC) 06/04/2020   Conductive hearing loss, bilateral 06/04/2020   Crohn's ileitis (HCC) 06/04/2020   Disorder of musculoskeletal system 06/04/2020   Erectile dysfunction 06/04/2020   Gastroesophageal reflux disease 06/04/2020   Gout 06/04/2020   History of adenomatous polyp of colon 06/04/2020   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   Hypothyroidism 06/04/2020   Increased frequency of urination 06/04/2020   Male hypogonadism 06/04/2020   Muscle pain 06/04/2020   Nocturia 06/04/2020   Obesity 06/04/2020   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   Osteopenia 06/04/2020   Peripheral neurogenic pain 06/04/2020   Recurrent falls 06/04/2020   Unspecified kyphosis, thoracic region 06/04/2020   Vitamin B12 deficiency 06/04/2020   Vitamin D deficiency 06/04/2020   Gait abnormality 05/17/2020   Ataxia 03/16/2020   Angina pectoris (HCC) 02/13/2019   Pseudophakia of both eyes 08/01/2018   History of total knee arthroplasty 03/29/2017   Stiffness of right knee 03/16/2017   OA (osteoarthritis) of knee 03/12/2017   Tremor, essential 12/03/2015   Essential hypertension 12/09/2014   CAD (coronary artery disease) 12/08/2013   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   Pure hypercholesterolemia 12/08/2013   Benign prostatic hyperplasia 08/08/2013   Essential and other specified forms of tremor 11/25/2012    PCP: Benedetta Bradley, MD  REFERRING PROVIDER: Benedetta Bradley, MD  REFERRING DIAG: R26.89  Balance disorder              R26.81  Gait Instability    M62.81 lower extremity weakness  THERAPY DIAG:  Other abnormalities of gait and mobility  Other lack of coordination  Muscle weakness (generalized)  Rationale for Evaluation  and Treatment: Rehabilitation  ONSET DATE: PT order 03/20/2023  SUBJECTIVE:   SUBJECTIVE STATEMENT: Pt saw MD at an ataxia clinic at [facility].  Pt states they may increase dosage of a medication for tremors and instructed pt to continue with land based and aquatic therapy.  Pt has been compliant with HEP.  He has been going to gym 3x/wk.  Pt performs chair yoga on Tuesdays.  Pt has had 2 falls since he was last seen.  He fell about 2 weeks ago when he was bending over to pick up dog poop in his yard.  Pt fell while walking in a parking lot when it was wet.  Pt denies any adverse effects after prior Rx.     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   PRECAUTIONS: Fall and Other: R TKA, Hx of CLL   WEIGHT BEARING RESTRICTIONS: No  FALLS:  Has patient fallen in last 6 months? Yes. Number of falls 1 when slipping on ice .  3 now.  06/28/23  LIVING ENVIRONMENT: Lives with: lives with their spouse Lives in: 2 story home with rails Stairs: yes Has following equipment at home:  cane, FWW  OCCUPATION: IT trainer, still works some  PLOF: Independent  PATIENT GOALS:  improve strength, feel more comfortable with walking, not feel that he is going to fall  OBJECTIVE:  Note: Objective measures were completed at Evaluation unless otherwise noted.  DIAGNOSTIC FINDINGS: N/A                                                                                                                                 TREATMENT DATE:   4/24 Nustep L5-6 bilat UE/LE's x 5 mins   Staggered stance x 30 sec without UE support Half tandem stance 2x30 sec bilat with and without UE support Tandem stance with UE support and 1 occasion of min assist Marching on airex with single UE  support x 1 set though needed moderate assist on 2nd set--performed 2nd set with bilat UE support Sidestepping without UE support x 2 laps Stepping over hurdles (4) with rail assist with a step to gait and reciprocal gait pattern with CGA  PT gave pt a HEP handout and was educated in correct form and appropriate frequency.  PT instructed pt to perform exercises with a sturdy surface such as a counter or rail by him for balance, safety, and support.     06/07/2023 Assessed hip abd strength and TUG test.  See above.  ABC scale:  Initial/Current: 75% / 75% indicated pt being limited with activities  Step ups on 6 inch step with CGA and 1 rail assist x 10 reps bilat Lateral step ups on 6 inch step with UE assist and SBA/CGA  Airex marches with 1 Ue assist and CGA/min assist Standing on airex x 30 sec with NBOS Reaching while standing on airex x 10 with FA and with NBOS with assistance for LOB Stepping over hurdles (4) with rail assist with a step to gait and reciprocal gait pattern with CGA/min assist and one occasion of max assist for LOB    DATE: 06/05/23 Pt seen for aquatic therapy today.  Treatment took place in water  3.5-4.75 ft in depth at the [facility] pool. Temp of water  was 91.  Pt entered/exited the pool via stairs with hand rail.   *walking forward, back and side stepping ue support barbel.   *ue support yellow noodle->unsupported: pivoting R/L forward and back ward amb 180d and 90d turning  *sts from 3rd water  step x5 *hip add sets using bb 5 *STS with adductor set from 3rd step CGA 4/5reps *scapular  retraction using riderband 2 x 12 elbows straight then bent *tandem stance leading R/L UE add/abd using RBHB in 4.0 ft(good challenge)  encouraged x 5 consecutive reps  Pt requires the buoyancy and hydrostatic pressure of water  for support, and to offload joints by unweighting joint load by at least 50 % in navel deep water  and by at least 75-80% in chest to neck deep  water .  Viscosity of the water  is needed for resistance of strengthening. Water  current perturbations provides challenge to standing balance requiring increased core activation.  Berg and 5 x STS completed    PATIENT EDUCATION:  Education details: relevant anatomy, exercise form, HEP, POC, dx, and rationale of interventions. Person educated: Patient Education method: Explanation, demonstration, verbal and tactile cues Education comprehension: verbalized understanding, returned demonstration, verbal and tactile cues required  HOME EXERCISE PROGRAM: Access Code: [email or web address] URL: [email or web address] Date: 04/23/2023 Prepared by: Marnie Siren  Updated HEP: - HALF TANDEM STANCE  - 1 x daily - 5-7 x weekly - 2 reps - 20-30 seconds hold - Side Stepping with Counter Support  - 1 x daily - 6-7 x weekly - 2-3 reps   ASSESSMENT:  CLINICAL IMPRESSION: Pt was last seen on 4/3.  He has been absent from PT due to the clinic's schedule availability.  Pt has had 2 falls since last PT treatment.  He reports he felt fine after the falls.  Pt gave great effort with all exercises.  Pt performed 1st set of airex marches using single UE support though needed moderate assist for balance on 2nd set.  Pt began leaning bwd on 2nd set requiring moderate assistance.  PT had pt perform 2nd set of airex marches with bilat UE support.  Pt is challenged with tandem stance requiring UE support.  Pt did well ambulating over hurdles with CGA and single UE support on rail.  PT worked on [employer] and updated HEP.  PT gave pt a HEP handout and educated pt in correct form, appropriate resistance, and appropriate set up including performing with an external support by him.  Pt responded well to Rx having no c/o's after Rx.   OBJECTIVE IMPAIRMENTS: Abnormal gait, decreased activity tolerance, decreased balance, decreased coordination, decreased endurance, decreased mobility, difficulty walking, decreased strength, and  postural dysfunction.   ACTIVITY LIMITATIONS: carrying, lifting, standing, squatting, and locomotion level  PARTICIPATION LIMITATIONS: shopping and community activity  PERSONAL FACTORS: Time since onset of injury/illness/exacerbation and 3+ comorbidities: FXAS, R TKA, peripheral neuropathy, CLL  are also affecting patient's functional outcome.   REHAB POTENTIAL: Good  CLINICAL DECISION MAKING: Evolving/moderate complexity  EVALUATION COMPLEXITY: Moderate   GOALS:  SHORT TERM GOALS: Target date: 05/11/2023  Pt will be independent with HEP for improved balance, strength, and functional mobility. Baseline: Goal status: In progress 05/24/23  2.  Pt will demo improved stability with turning with gait.  Baseline:  Goal status: In progress 05/24/23  3.  Pt will perform TUG safely including turning and and performing stand to sit transfer with good control and without assistance.  Baseline:  Goal status: PROGRESSING  06/07/23  4.  Pt will be able to perform tandem stance for at least 20 seconds for improved balance and stability with daily activities.  Baseline:  Goal status: in progress 05/24/23 Target date:  05/18/2023   LONG TERM GOALS: Target date: 06/01/2023   Pt will demo improved time on 5x STS test by 3 seconds and also improved form by not using his legs on the back of the  chair for improved functional LE strength and performance of transfers.  Baseline:  Goal status: Un Progress 06/05/23  2.  Pt will demo improved R hip flexion strength to 5/5 MMT and bilat hip abd strength by at least 6-8 lbs for improved strength to perform functional mobility with increased ease and less difficulty.  Baseline:  Goal status: 66% MET   3.  (Will set Berg goal next visit after testing. )Pt will improve on Berg balance test to >/= 36/56 to demonstrate a decrease in fall risk. Baseline: 26/56 06/05/23 Goal status: in progress 06/05/23  4.  Pt will report at least a 70% improvement in stability  with gait and community ambulation.  Baseline: 06/05/23 5% Goal status: In progress 06/05/23  5.  Pt will report improved confidence with his daily functional mobility.  Baseline:  Goal status: INITIAL     PLAN:  PT FREQUENCY: 2x/week  PT DURATION: 6 weeks  PLANNED INTERVENTIONS: 97164- PT Re-evaluation, 97110-Therapeutic exercises, 97530- Therapeutic activity, [id number]- Neuromuscular re-education, 97535- Self Care, 16109- Manual therapy, [phone]- Gait training, [phone]- Aquatic Therapy, Patient/Family education, Balance training, Stair training, and Taping  PLAN FOR NEXT SESSION: Cont with Balance and Gait training and strengthening.  Cont with aquatic and land based PT.   Trina Fujita III PT, DPT 06/29/23 7:01 AM

## 2023-06-29 ENCOUNTER — Ambulatory Visit (HOSPITAL_BASED_OUTPATIENT_CLINIC_OR_DEPARTMENT_OTHER): Admitting: Physical Therapy

## 2023-06-29 ENCOUNTER — Encounter (HOSPITAL_BASED_OUTPATIENT_CLINIC_OR_DEPARTMENT_OTHER): Payer: Self-pay | Admitting: Physical Therapy

## 2023-06-29 DIAGNOSIS — R278 Other lack of coordination: Secondary | ICD-10-CM | POA: Diagnosis not present

## 2023-06-29 DIAGNOSIS — R293 Abnormal posture: Secondary | ICD-10-CM | POA: Diagnosis not present

## 2023-06-29 DIAGNOSIS — R2689 Other abnormalities of gait and mobility: Secondary | ICD-10-CM | POA: Diagnosis not present

## 2023-06-29 DIAGNOSIS — M6281 Muscle weakness (generalized): Secondary | ICD-10-CM | POA: Diagnosis not present

## 2023-06-29 NOTE — Therapy (Signed)
 OUTPATIENT PHYSICAL THERAPY LOWER EXTREMITY TREATMENT                      Patient Name: Cole Martinez MRN: [id number] DOB:Jul 17, 1945, 78 y.o., male Today's Date: 06/29/2023  END OF SESSION:  PT End of Session - 06/29/23 1023     Visit Number 14    Number of Visits 24    Date for PT Re-Evaluation 07/20/23    Authorization Type MCR A and B    PT Start Time 1015    PT Stop Time 1055    PT Time Calculation (min) 40 min    Activity Tolerance Patient tolerated treatment well    Behavior During Therapy WFL for tasks assessed/performed                Past Medical History:  Diagnosis Date   ADHD (attention deficit hyperactivity disorder)    B12 deficiency    BPH (benign prostatic hypertrophy)    Chronic lymphocytic leukemia (CLL), T-cell (HCC) DX 1996--  ONCOLOGIST-  DR Scherrie Curt   PT IS ASYMPTOMATIC--- LAST CBC W/ DIFF 06-24-2012 STABLE   Coronary artery disease CARDIOLOGIST- DR Kay Parson   S/P STENTING LAD 1999 // Myoview  01/2019: EF 62, normal perfusion; Low Risk   Crohn's disease of ileum (HCC) SINCE 1988   ED (erectile dysfunction)    Elevated PSA    Essential and other specified forms of tremor 11/25/2012   Gait abnormality 05/17/2020   H/O adenomatous polyp of colon    Hyperlipidemia    Hypertension    Nocturia    OA (osteoarthritis)    Peripheral neuropathy    hx of, none current as of 08-04-13   Peyronie disease    S/P coronary artery stent placement OCT 1999 OF LAD   Tremor, hereditary, benign MILD RIGHT HAND   Past Surgical History:  Procedure Laterality Date   CARDIOVASCULAR STRESS TEST  11-01-2010 DR Kay Parson   NORMAL PERFUSION STUDY/ EF 64%/ NO ISCHEMIA   cataract surgery  Bilateral feburary 2020   with lens placement    COLONOSCOPY WITH PROPOFOL  N/A 09/24/2012   Procedure: COLONOSCOPY WITH PROPOFOL ;  Surgeon: Garrett Kallman, MD;  Location: [facility];  Service: Endoscopy;  Laterality: N/A;   CORONARY ANGIOPLASTY WITH STENT PLACEMENT  OCT  1999   STENT OF LAD   CORONARY STENT INTERVENTION N/A 02/14/2019   Procedure: CORONARY STENT INTERVENTION;  Surgeon: Arty Binning, MD;  Location: [facility];  Service: Cardiovascular;  Laterality: N/A;   CYSTOSCOPY WITH URETHRAL DILATATION  03/12/2017   Procedure: CYSTOSCOPY WITH URETHRAL DILATATION;  Surgeon: Liliane Rei, MD;  Location: [facility];  Service: Orthopedics;;  Arden Kotyk, Resident Assisting   CYSTOSCOPY WITH URETHRAL DILATATION N/A 10/03/2018   Procedure: CYSTOSCOPY WITH URETHRAL BALLOON DILATATION WITH BILATERAL RETROGRADE PYELOGRAPHY;  Surgeon: Florencio Hunting, MD;  Location: [facility];  Service: Urology;  Laterality: N/A;   CYSTOSCOPY WITH URETHRAL DILATATION N/A 12/30/2020   Procedure: CYSTOSCOPY WITH BALLOON DILATATION OF URETHRAL STRICTURE;  Surgeon: Florencio Hunting, MD;  Location: [facility];  Service: Urology;  Laterality: N/A;   LAPAROSCOPIC INGUINAL HERNIA REPAIR Bilateral 01-10-2004   W/ MESH   LEFT HEART CATH AND CORONARY ANGIOGRAPHY N/A 02/13/2019   Procedure: LEFT HEART CATH AND CORONARY ANGIOGRAPHY;  Surgeon: Arty Binning, MD;  Location: [facility];  Service: Cardiovascular;  Laterality: N/A;   neck benign removed from neck  yrs ago   PROSTATE BIOPSY N/A 07/12/2012  Procedure: PROSTATE BIOPSY AND ULTRASOUND;  Surgeon: Edmund Gouge, MD;  Location[facility];  Service: Urology;  Laterality: N/A;   PROSTATE SURGERY  2002   tuna   REMOVAL LEFT NECK LYMPH NODE  1996   TONSILLECTOMY  CHILD   TOTAL KNEE ARTHROPLASTY Right 03/12/2017   Procedure: RIGHT TOTAL KNEE ARTHROPLASTY;  Surgeon: Liliane Rei, MD;  Location: [facility];  Service: Orthopedics;  Laterality: Right;  Adductor Block   TRANSURETHRAL RESECTION OF PROSTATE N/A 08/08/2013   Procedure: TRANSURETHRAL RESECTION OF THE PROSTATE WITH GYRUS INSTRUMENTS;  Surgeon: Edmund Gouge, MD;  Location: [facility];  Service: Urology;  Laterality: N/A;   TRANSURETHRAL RESECTION OF PROSTATE      Patient Active Problem List   Diagnosis Date Noted   Immunization counseling 12/20/2022   Cellulitis 12/20/2022   Medication management 12/20/2022   Adult attention deficit disorder 06/04/2020   Chronic lymphocytic leukemia (HCC) 06/04/2020   Conductive hearing loss, bilateral 06/04/2020   Crohn's ileitis (HCC) 06/04/2020   Disorder of musculoskeletal system 06/04/2020   Erectile dysfunction 06/04/2020   Gastroesophageal reflux disease 06/04/2020   Gout 06/04/2020   History of adenomatous polyp of colon 06/04/2020   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   Hypothyroidism 06/04/2020   Increased frequency of urination 06/04/2020   Male hypogonadism 06/04/2020   Muscle pain 06/04/2020   Nocturia 06/04/2020   Obesity 06/04/2020   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   Osteopenia 06/04/2020   Peripheral neurogenic pain 06/04/2020   Recurrent falls 06/04/2020   Unspecified kyphosis, thoracic region 06/04/2020   Vitamin B12 deficiency 06/04/2020   Vitamin D deficiency 06/04/2020   Gait abnormality 05/17/2020   Ataxia 03/16/2020   Angina pectoris (HCC) 02/13/2019   Pseudophakia of both eyes 08/01/2018   History of total knee arthroplasty 03/29/2017   Stiffness of right knee 03/16/2017   OA (osteoarthritis) of knee 03/12/2017   Tremor, essential 12/03/2015   Essential hypertension 12/09/2014   CAD (coronary artery disease) 12/08/2013   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   Pure hypercholesterolemia 12/08/2013   Benign prostatic hyperplasia 08/08/2013   Essential and other specified forms of tremor 11/25/2012    PCP: Benedetta Bradley, MD  REFERRING PROVIDER: Benedetta Bradley, MD  REFERRING DIAG: R26.89  Balance disorder              R26.81  Gait Instability    M62.81 lower extremity weakness  THERAPY DIAG:  Other abnormalities of gait and mobility  Other lack of coordination  Muscle weakness (generalized)  Rationale for Evaluation  and Treatment: Rehabilitation  ONSET DATE: PT order 03/20/2023  SUBJECTIVE:   SUBJECTIVE STATEMENT: Pt reports no pain.  Fills this PT in on recent falls.  Pt saw MD at an ataxia clinic at [facility].  Pt states they may increase dosage of a medication for tremors and instructed pt to continue with land based and aquatic therapy.  Pt has been compliant with HEP.  He has been going to gym 3x/wk.  Pt performs chair yoga on Tuesdays.  Pt has had 2 falls since he was last seen.  He fell about 2 weeks ago when he was bending over to pick up dog poop in his yard.  Pt fell while walking in a parking lot when it was wet.  Pt denies any adverse effects after prior Rx.     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   PRECAUTIONS: Fall and Other: R TKA, Hx of CLL   WEIGHT BEARING RESTRICTIONS: No  FALLS:  Has patient fallen in last 6 months? Yes. Number of falls 1 when slipping on ice .  3 now.  06/28/23  LIVING ENVIRONMENT: Lives with: lives with their spouse Lives in: 2 story home with rails Stairs: yes Has following equipment at home:  cane, FWW  OCCUPATION: IT trainer, still works some  PLOF: Independent  PATIENT GOALS:  improve strength, feel more comfortable with walking, not feel that he is going to fall  OBJECTIVE:  Note: Objective measures were completed at Evaluation unless otherwise noted.  DIAGNOSTIC FINDINGS: N/A                                                                                                                                 TREATMENT DATE:   DATE: 06/29/23 Pt seen for aquatic therapy today.  Treatment took place in water  3.5-4.75 ft in depth at the [facility] pool. Temp of water  was 91.  Pt entered/exited the pool  via stairs with hand rail.   *walking forward, back and side stepping ue support barbel.   *scapular retraction using riderband 2 x 12 elbows straight then bent *hip add sets using bb 5 *sts from 3rd water  step x10 with  LOB. Cues for immediate standing balance *STS with adductor set from 3rd step CGA 2/7; LOB 2/7; indep 4/7 *tandem walking forward and back *tandem stance leading R/L UE add/abd using RBHB in 4.0 ft(good challenge)  encouraged x 5 consecutive reps. Attempted in 3.89ft  but unsuccessful.  Requires deeper water  to maintain position *side stepping ue add/abd with RBHB x 4 widths  Pt requires the buoyancy and hydrostatic pressure of water  for support, and to offload joints by unweighting joint load by at least 50 % in navel deep water  and by at least 75-80% in chest to neck deep water .  Viscosity of the water  is needed for resistance of strengthening. Water  current perturbations provides challenge to standing balance requiring increased core activation.  4/24 Nustep L5-6 bilat UE/LE's x 5 mins   Staggered stance x 30 sec without UE support Half tandem stance 2x30 sec bilat with and without UE support Tandem stance with UE support and 1 occasion of min assist Marching on airex with single UE support x 1 set though needed moderate assist on 2nd set--performed 2nd set with bilat UE support Sidestepping without UE support x 2 laps Stepping over hurdles (4) with rail assist with a step to gait and reciprocal gait pattern with CGA  PT gave pt a HEP handout and was educated in correct form and appropriate frequency.  PT instructed pt to perform exercises with a sturdy surface such as a counter or rail by  him for balance, safety, and support.     06/07/2023 Assessed hip abd strength and TUG test.  See above.  ABC scale:  Initial/Current: 75% / 75% indicated pt being limited with activities  Step ups on 6 inch step with CGA and 1 rail assist x 10 reps bilat Lateral step ups on 6 inch  step with UE assist and SBA/CGA  Airex marches with 1 Ue assist and CGA/min assist Standing on airex x 30 sec with NBOS Reaching while standing on airex x 10 with FA and with NBOS with assistance for LOB Stepping over hurdles (4) with rail assist with a step to gait and reciprocal gait pattern with CGA/min assist and one occasion of max assist for LOB    DATE: 06/05/23 Pt seen for aquatic therapy today.  Treatment took place in water  3.5-4.75 ft in depth at the [facility] pool. Temp of water  was 91.  Pt entered/exited the pool via stairs with hand rail.   *walking forward, back and side stepping ue support barbel.   *ue support yellow noodle->unsupported: pivoting R/L forward and back ward amb 180d and 90d turning  *sts from 3rd water  step x5 *hip add sets using bb 5 *STS with adductor set from 3rd step CGA 4/5reps *scapular retraction using riderband 2 x 12 elbows straight then bent *tandem stance leading R/L UE add/abd using RBHB in 4.0 ft(good challenge)  encouraged x 5 consecutive reps  Pt requires the buoyancy and hydrostatic pressure of water  for support, and to offload joints by unweighting joint load by at least 50 % in navel deep water  and by at least 75-80% in chest to neck deep water .  Viscosity of the water  is needed for resistance of strengthening. Water  current perturbations provides challenge to standing balance requiring increased core activation.      PATIENT EDUCATION:  Education details: relevant anatomy, exercise form, HEP, POC, dx, and rationale of interventions. Person educated: Patient Education method: Explanation, demonstration, verbal and tactile cues Education comprehension: verbalized understanding, returned demonstration, verbal and tactile cues required  HOME EXERCISE PROGRAM: Access Code: [email or web address] URL: [email or web address] Date: 04/23/2023 Prepared by: Marnie Siren  Updated HEP: - HALF TANDEM STANCE  - 1 x daily - 5-7 x  weekly - 2 reps - 20-30 seconds hold - Side Stepping with Counter Support  - 1 x daily - 6-7 x weekly - 2-3 reps   ASSESSMENT:  CLINICAL IMPRESSION: Progressed balance challenges and core strengthening in aquatic.  He demonstrates improvement with STS transfers gaining immediate standing balance.  Discussed more consistent use of cane for fall prevention.  Also suggested pt consider a rollator for future use. He is in good spirits and has good attitude.  Goals ongoing    OBJECTIVE IMPAIRMENTS: Abnormal gait, decreased activity tolerance, decreased balance, decreased coordination, decreased endurance, decreased mobility, difficulty walking, decreased strength, and postural dysfunction.   ACTIVITY LIMITATIONS: carrying, lifting, standing, squatting, and locomotion level  PARTICIPATION LIMITATIONS: shopping and community activity  PERSONAL FACTORS: Time since onset of injury/illness/exacerbation and 3+ comorbidities: FXAS, R TKA, peripheral neuropathy, CLL  are also affecting patient's functional outcome.   REHAB POTENTIAL: Good  CLINICAL DECISION MAKING: Evolving/moderate complexity  EVALUATION COMPLEXITY: Moderate   GOALS:  SHORT TERM GOALS: Target date: 05/11/2023  Pt will be independent with HEP for improved balance, strength, and functional mobility. Baseline: Goal status: In progress 05/24/23  2.  Pt will demo improved stability with turning with gait.  Baseline:  Goal status: In progress 05/24/23  3.  Pt will perform TUG safely including turning and and performing stand to sit transfer with good control and without assistance.  Baseline:  Goal status: PROGRESSING  06/07/23  4.  Pt will be able to perform tandem stance for at least 20 seconds for improved balance and stability with daily activities.  Baseline:  Goal status: in progress 05/24/23 Target date:  05/18/2023   LONG TERM GOALS: Target date: 06/01/2023   Pt will demo improved time on 5x STS test by 3 seconds and  also improved form by not using his legs on the back of the chair for improved functional LE strength and performance of transfers.  Baseline:  Goal status: Un Progress 06/05/23  2.  Pt will demo improved R hip flexion strength to 5/5 MMT and bilat hip abd strength by at least 6-8 lbs for improved strength to perform functional mobility with increased ease and less difficulty.  Baseline:  Goal status: 66% MET   3.  (Will set Berg goal next visit after testing. )Pt will improve on Berg balance test to >/= 36/56 to demonstrate a decrease in fall risk. Baseline: 26/56 06/05/23 Goal status: in progress 06/05/23  4.  Pt will report at least a 70% improvement in stability with gait and community ambulation.  Baseline: 06/05/23 5% Goal status: In progress 06/05/23  5.  Pt will report improved confidence with his daily functional mobility.  Baseline:  Goal status: INITIAL     PLAN:  PT FREQUENCY: 2x/week  PT DURATION: 6 weeks  PLANNED INTERVENTIONS: [id number]- PT Re-evaluation, 97110-Therapeutic exercises, 97530- Therapeutic activity, [id number]- Neuromuscular re-education, 97535- Self Care, [id number]- Manual therapy, [phone]- Gait training, [phone]- Aquatic Therapy, Patient/Family education, Balance training, Stair training, and Taping  PLAN FOR NEXT SESSION: Cont with Balance and Gait training and strengthening.  Cont with aquatic and land based PT.   [address] [place]) Johnanthony Wilden MPT 06/29/23 10:24 AM [facility] [address] [place], [place], [address] Phone: [phone]   Fax:  [phone]

## 2023-07-02 DIAGNOSIS — R7303 Prediabetes: Secondary | ICD-10-CM | POA: Diagnosis not present

## 2023-07-02 DIAGNOSIS — M109 Gout, unspecified: Secondary | ICD-10-CM | POA: Diagnosis not present

## 2023-07-02 DIAGNOSIS — Q992 Fragile X chromosome: Secondary | ICD-10-CM | POA: Diagnosis not present

## 2023-07-02 DIAGNOSIS — F5101 Primary insomnia: Secondary | ICD-10-CM | POA: Diagnosis not present

## 2023-07-02 DIAGNOSIS — F439 Reaction to severe stress, unspecified: Secondary | ICD-10-CM | POA: Diagnosis not present

## 2023-07-02 DIAGNOSIS — G119 Hereditary ataxia, unspecified: Secondary | ICD-10-CM | POA: Diagnosis not present

## 2023-07-02 DIAGNOSIS — K219 Gastro-esophageal reflux disease without esophagitis: Secondary | ICD-10-CM | POA: Diagnosis not present

## 2023-07-02 DIAGNOSIS — F900 Attention-deficit hyperactivity disorder, predominantly inattentive type: Secondary | ICD-10-CM | POA: Diagnosis not present

## 2023-07-02 DIAGNOSIS — E230 Hypopituitarism: Secondary | ICD-10-CM | POA: Diagnosis not present

## 2023-07-02 DIAGNOSIS — G250 Essential tremor: Secondary | ICD-10-CM | POA: Diagnosis not present

## 2023-07-02 DIAGNOSIS — C9110 Chronic lymphocytic leukemia of B-cell type not having achieved remission: Secondary | ICD-10-CM | POA: Diagnosis not present

## 2023-07-02 DIAGNOSIS — I2510 Atherosclerotic heart disease of native coronary artery without angina pectoris: Secondary | ICD-10-CM | POA: Diagnosis not present

## 2023-07-03 ENCOUNTER — Encounter (HOSPITAL_BASED_OUTPATIENT_CLINIC_OR_DEPARTMENT_OTHER): Payer: Self-pay | Admitting: Physical Therapy

## 2023-07-03 ENCOUNTER — Ambulatory Visit (HOSPITAL_BASED_OUTPATIENT_CLINIC_OR_DEPARTMENT_OTHER): Admitting: Physical Therapy

## 2023-07-03 DIAGNOSIS — M6281 Muscle weakness (generalized): Secondary | ICD-10-CM

## 2023-07-03 DIAGNOSIS — R2689 Other abnormalities of gait and mobility: Secondary | ICD-10-CM | POA: Diagnosis not present

## 2023-07-03 DIAGNOSIS — R278 Other lack of coordination: Secondary | ICD-10-CM | POA: Diagnosis not present

## 2023-07-03 DIAGNOSIS — R293 Abnormal posture: Secondary | ICD-10-CM | POA: Diagnosis not present

## 2023-07-03 NOTE — Therapy (Signed)
 OUTPATIENT PHYSICAL THERAPY LOWER EXTREMITY TREATMENT                      Patient Name: Cole Martinez MRN: [id number] DOB:[birth year], 78 y.o., male Today's Date: 07/03/2023  END OF SESSION:  PT End of Session - 07/03/23 1544     Visit Number 15    Number of Visits 24    Date for PT Re-Evaluation 07/20/23    Authorization Type MCR A and B    PT Start Time 1533    PT Stop Time 1612    PT Time Calculation (min) 39 min    Activity Tolerance Patient tolerated treatment well    Behavior During Therapy WFL for tasks assessed/performed                 Past Medical History:  Diagnosis Date   ADHD (attention deficit hyperactivity disorder)    B12 deficiency    BPH (benign prostatic hypertrophy)    Chronic lymphocytic leukemia (CLL), T-cell (HCC) DX 1996--  ONCOLOGIST-  DR Scherrie Curt   PT IS ASYMPTOMATIC--- LAST CBC W/ DIFF 06-24-2012 STABLE   Coronary artery disease CARDIOLOGIST- DR Kay Parson   S/P STENTING LAD 1999 // Myoview  01/2019: EF 62, normal perfusion; Low Risk   Crohn's disease of ileum (HCC) SINCE 1988   ED (erectile dysfunction)    Elevated PSA    Essential and other specified forms of tremor 11/25/2012   Gait abnormality 05/17/2020   H/O adenomatous polyp of colon    Hyperlipidemia    Hypertension    Nocturia    OA (osteoarthritis)    Peripheral neuropathy    hx of, none current as of 08-04-13   Peyronie disease    S/P coronary artery stent placement OCT 1999 OF LAD   Tremor, hereditary, benign MILD RIGHT HAND   Past Surgical History:  Procedure Laterality Date   CARDIOVASCULAR STRESS TEST  11-01-2010 DR Kay Parson   NORMAL PERFUSION STUDY/ EF 64%/ NO ISCHEMIA   cataract surgery  Bilateral feburary 2020   with lens placement    COLONOSCOPY WITH PROPOFOL  N/A 09/24/2012   Procedure: COLONOSCOPY WITH PROPOFOL ;  Surgeon: Garrett Kallman, MD;  Location: [facility];  Service: Endoscopy;  Laterality: N/A;   CORONARY ANGIOPLASTY WITH STENT PLACEMENT  OCT  1999   STENT OF LAD   CORONARY STENT INTERVENTION N/A 02/14/2019   Procedure: CORONARY STENT INTERVENTION;  Surgeon: Arty Binning, MD;  Location: [facility];  Service: Cardiovascular;  Laterality: N/A;   CYSTOSCOPY WITH URETHRAL DILATATION  03/12/2017   Procedure: CYSTOSCOPY WITH URETHRAL DILATATION;  Surgeon: Liliane Rei, MD;  Location: [facility];  Service: Orthopedics;;  Arden Kotyk, Resident Assisting   CYSTOSCOPY WITH URETHRAL DILATATION N/A 10/03/2018   Procedure: CYSTOSCOPY WITH URETHRAL BALLOON DILATATION WITH BILATERAL RETROGRADE PYELOGRAPHY;  Surgeon: Florencio Hunting, MD;  Location: [facility];  Service: Urology;  Laterality: N/A;   CYSTOSCOPY WITH URETHRAL DILATATION N/A 12/30/2020   Procedure: CYSTOSCOPY WITH BALLOON DILATATION OF URETHRAL STRICTURE;  Surgeon: Florencio Hunting, MD;  Location: [facility];  Service: Urology;  Laterality: N/A;   LAPAROSCOPIC INGUINAL HERNIA REPAIR Bilateral 01-10-2004   W/ MESH   LEFT HEART CATH AND CORONARY ANGIOGRAPHY N/A 02/13/2019   Procedure: LEFT HEART CATH AND CORONARY ANGIOGRAPHY;  Surgeon: Arty Binning, MD;  Location: [facility];  Service: Cardiovascular;  Laterality: N/A;   neck benign removed from neck  yrs ago   PROSTATE BIOPSY N/A 07/12/2012  Procedure: PROSTATE BIOPSY AND ULTRASOUND;  Surgeon: Edmund Gouge, MD;  Location: [facility];  Service: Urology;  Laterality: N/A;   PROSTATE SURGERY  2002   tuna   REMOVAL LEFT NECK LYMPH NODE  1996   TONSILLECTOMY  CHILD   TOTAL KNEE ARTHROPLASTY Right 03/12/2017   Procedure: RIGHT TOTAL KNEE ARTHROPLASTY;  Surgeon: Liliane Rei, MD;  Location: [facility];  Service: Orthopedics;  Laterality: Right;  Adductor Block   TRANSURETHRAL RESECTION OF PROSTATE N/A 08/08/2013   Procedure: TRANSURETHRAL RESECTION OF THE PROSTATE WITH GYRUS INSTRUMENTS;  Surgeon: Edmund Gouge, MD;  Location: [facility];  Service: Urology;  Laterality: N/A;   TRANSURETHRAL RESECTION OF PROSTATE      Patient Active Problem List   Diagnosis Date Noted   Immunization counseling 12/20/2022   Cellulitis 12/20/2022   Medication management 12/20/2022   Adult attention deficit disorder 06/04/2020   Chronic lymphocytic leukemia (HCC) 06/04/2020   Conductive hearing loss, bilateral 06/04/2020   Crohn's ileitis (HCC) 06/04/2020   Disorder of musculoskeletal system 06/04/2020   Erectile dysfunction 06/04/2020   Gastroesophageal reflux disease 06/04/2020   Gout 06/04/2020   History of adenomatous polyp of colon 06/04/2020   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   Hypothyroidism 06/04/2020   Increased frequency of urination 06/04/2020   Male hypogonadism 06/04/2020   Muscle pain 06/04/2020   Nocturia 06/04/2020   Obesity 06/04/2020   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   Osteopenia 06/04/2020   Peripheral neurogenic pain 06/04/2020   Recurrent falls 06/04/2020   Unspecified kyphosis, thoracic region 06/04/2020   Vitamin B12 deficiency 06/04/2020   Vitamin D deficiency 06/04/2020   Gait abnormality 05/17/2020   Ataxia 03/16/2020   Angina pectoris (HCC) 02/13/2019   Pseudophakia of both eyes 08/01/2018   History of total knee arthroplasty 03/29/2017   Stiffness of right knee 03/16/2017   OA (osteoarthritis) of knee 03/12/2017   Tremor, essential 12/03/2015   Essential hypertension 12/09/2014   CAD (coronary artery disease) 12/08/2013   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   Pure hypercholesterolemia 12/08/2013   Benign prostatic hyperplasia 08/08/2013   Essential and other specified forms of tremor 11/25/2012    PCP: Benedetta Bradley, MD  REFERRING PROVIDER: Benedetta Bradley, MD  REFERRING DIAG: R26.89  Balance disorder              R26.81  Gait Instability    M62.81 lower extremity weakness  THERAPY DIAG:  Other abnormalities of gait and mobility  Other lack of coordination  Muscle weakness (generalized)  Abnormal  posture  Rationale for Evaluation and Treatment: Rehabilitation  ONSET DATE: PT order 03/20/2023  SUBJECTIVE:   SUBJECTIVE STATEMENT: Pt states he is doing ok.  Saw neurologist said there is not much she can do.  Pt saw MD at an ataxia clinic at [facility].  Pt states they may increase dosage of a medication for tremors and instructed pt to continue with land based and aquatic therapy.  Pt has been compliant with HEP.  He has been going to gym 3x/wk.  Pt performs chair yoga on Tuesdays.  Pt has had 2 falls since he was last seen.  He fell about 2 weeks ago when he was bending over to pick up dog poop in his yard.  Pt fell while walking in a parking lot when it was wet.  Pt denies any adverse effects after prior Rx.     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   PRECAUTIONS: Fall and Other: R TKA, Hx of CLL   WEIGHT BEARING RESTRICTIONS: No  FALLS:  Has patient fallen in last 6 months? Yes. Number of falls 1 when slipping on ice .  3 now.  06/28/23  LIVING ENVIRONMENT: Lives with: lives with their spouse Lives in: 2 story home with rails Stairs: yes Has following equipment at home:  cane, FWW  OCCUPATION: IT trainer, still works some  PLOF: Independent  PATIENT GOALS:  improve strength, feel more comfortable with walking, not feel that he is going to fall  OBJECTIVE:  Note: Objective measures were completed at Evaluation unless otherwise noted.  DIAGNOSTIC FINDINGS: N/A                                                                                                                                 TREATMENT DATE:   DATE: 07/03/23 Pt seen for aquatic therapy today.  Treatment took place in water  3.5-4.75 ft in depth at the [facility]  pool. Temp of water  was 91.  Pt entered/exited the pool via stairs with hand rail.   *walking forward, back and side stepping ue support yellow HB *tandem stance leading R/L UE add/abd using RBHB in 4.0 ft(good challenge)  Requires deeper water  to maintain position *tree pose ue support RBHB  *scapular retraction using riderband 2 x 12 elbows straight then bent *sitting balance on yellow noodle (swing like) (good challenge) *straddling noodle: cycling.  Requires ue support on yellow HB *ue support corner wall: suspended vertical hip add/abd; flex/ext   Pt requires the buoyancy and hydrostatic pressure of water  for support, and to offload joints by unweighting joint load by at least 50 % in navel deep water  and by at least 75-80% in chest to neck deep water .  Viscosity of the water  is needed for resistance of strengthening. Water  current perturbations provides challenge to standing balance requiring increased core activation.  4/24 Nustep L5-6 bilat UE/LE's x 5 mins   Staggered stance x 30 sec without UE support Half tandem stance 2x30 sec bilat with and without UE support Tandem stance with UE support and 1 occasion of min assist Marching on airex with single UE support x 1 set though needed moderate assist on 2nd set--performed 2nd set with bilat UE support Sidestepping without UE support x 2 laps Stepping over hurdles (4) with rail assist with a step to gait and reciprocal gait pattern with CGA  PT gave pt a HEP handout and was educated in correct form and appropriate frequency.  PT instructed pt to perform exercises with a sturdy surface such as a counter or rail by him for balance, safety, and support.     06/07/2023 Assessed hip abd strength and TUG  test.  See above.  ABC scale:  Initial/Current: 75% / 75% indicated pt being limited with activities  Step ups on 6 inch step with CGA and 1 rail assist x 10 reps bilat Lateral step ups on 6 inch step with UE assist and  SBA/CGA  Airex marches with 1 Ue assist and CGA/min assist Standing on airex x 30 sec with NBOS Reaching while standing on airex x 10 with FA and with NBOS with assistance for LOB Stepping over hurdles (4) with rail assist with a step to gait and reciprocal gait pattern with CGA/min assist and one occasion of max assist for LOB    DATE: 06/05/23 Pt seen for aquatic therapy today.  Treatment took place in water  3.5-4.75 ft in depth at the [facility] pool. Temp of water  was 91.  Pt entered/exited the pool via stairs with hand rail.   *walking forward, back and side stepping ue support barbel.   *ue support yellow noodle->unsupported: pivoting R/L forward and back ward amb 180d and 90d turning  *sts from 3rd water  step x5 *hip add sets using bb 5 *STS with adductor set from 3rd step CGA 4/5reps *scapular retraction using riderband 2 x 12 elbows straight then bent *tandem stance leading R/L UE add/abd using RBHB in 4.0 ft(good challenge)  encouraged x 5 consecutive reps  Pt requires the buoyancy and hydrostatic pressure of water  for support, and to offload joints by unweighting joint load by at least 50 % in navel deep water  and by at least 75-80% in chest to neck deep water .  Viscosity of the water  is needed for resistance of strengthening. Water  current perturbations provides challenge to standing balance requiring increased core activation.      PATIENT EDUCATION:  Education details: relevant anatomy, exercise form, HEP, POC, dx, and rationale of interventions. Person educated: Patient Education method: Explanation, demonstration, verbal and tactile cues Education comprehension: verbalized understanding, returned demonstration, verbal and tactile cues required  HOME EXERCISE PROGRAM: Access Code: [email or web address] URL: [email or web address] Date: 04/23/2023 Prepared by: Marnie Siren  Updated HEP: - HALF TANDEM STANCE  - 1 x daily - 5-7 x weekly - 2 reps - 20-30  seconds hold - Side Stepping with Counter Support  - 1 x daily - 6-7 x weekly - 2-3 reps   ASSESSMENT:  CLINICAL IMPRESSION: Focused on sitting balance today using various positions on noodle.  Good core engagement with challenges. Continues to be challenged with dynamic tandem stance.  Puts forth good effort without frustration.  He VU that it is iportant for him to follow and balance program going forward to ensure a lower fall risk as able. No pain.  Next session last session.  Will create and issue HEP for indep completion.        OBJECTIVE IMPAIRMENTS: Abnormal gait, decreased activity tolerance, decreased balance, decreased coordination, decreased endurance, decreased mobility, difficulty walking, decreased strength, and postural dysfunction.   ACTIVITY LIMITATIONS: carrying, lifting, standing, squatting, and locomotion level  PARTICIPATION LIMITATIONS: shopping and community activity  PERSONAL FACTORS: Time since onset of injury/illness/exacerbation and 3+ comorbidities: FXAS, R TKA, peripheral neuropathy, CLL  are also affecting patient's functional outcome.   REHAB POTENTIAL: Good  CLINICAL DECISION MAKING: Evolving/moderate complexity  EVALUATION COMPLEXITY: Moderate   GOALS:  SHORT TERM GOALS: Target date: 05/11/2023  Pt will be independent with HEP for improved balance, strength, and functional mobility. Baseline: Goal status: In progress 05/24/23  2.  Pt will demo improved stability with turning with gait.  Baseline:  Goal status: In progress 05/24/23  3.  Pt will perform TUG safely including turning and and performing stand to sit transfer with good control and without assistance.  Baseline:  Goal status: PROGRESSING  06/07/23  4.  Pt will be able to perform tandem stance for at least 20 seconds for improved balance and stability with daily activities.  Baseline:  Goal status: in progress 05/24/23 Target date:  05/18/2023   LONG TERM GOALS: Target date:  06/01/2023   Pt will demo improved time on 5x STS test by 3 seconds and also improved form by not using his legs on the back of the chair for improved functional LE strength and performance of transfers.  Baseline:  Goal status: Un Progress 06/05/23  2.  Pt will demo improved R hip flexion strength to 5/5 MMT and bilat hip abd strength by at least 6-8 lbs for improved strength to perform functional mobility with increased ease and less difficulty.  Baseline:  Goal status: 66% MET   3.  (Will set Berg goal next visit after testing. )Pt will improve on Berg balance test to >/= 36/56 to demonstrate a decrease in fall risk. Baseline: 26/56 06/05/23 Goal status: in progress 06/05/23  4.  Pt will report at least a 70% improvement in stability with gait and community ambulation.  Baseline: 06/05/23 5% Goal status: In progress 06/05/23  5.  Pt will report improved confidence with his daily functional mobility.  Baseline:  Goal status: INITIAL     PLAN:  PT FREQUENCY: 2x/week  PT DURATION: 6 weeks  PLANNED INTERVENTIONS: 97164- PT Re-evaluation, 97110-Therapeutic exercises, 97530- Therapeutic activity, [id number]- Neuromuscular re-education, 97535- Self Care, 30160- Manual therapy, [phone]- Gait training, [phone]- Aquatic Therapy, Patient/Family education, Balance training, Stair training, and Taping  PLAN FOR NEXT SESSION: Cont with Balance and Gait training and strengthening.  Cont with aquatic and land based PT.   Adriana Hopping [place]) Breanna Shorkey MPT 07/03/23 3:49 PM [facility] [address] [place], [place], [address] Phone: [phone]   Fax:  [phone]

## 2023-07-04 DIAGNOSIS — E669 Obesity, unspecified: Secondary | ICD-10-CM | POA: Diagnosis not present

## 2023-07-04 DIAGNOSIS — I2510 Atherosclerotic heart disease of native coronary artery without angina pectoris: Secondary | ICD-10-CM | POA: Diagnosis not present

## 2023-07-04 DIAGNOSIS — E7800 Pure hypercholesterolemia, unspecified: Secondary | ICD-10-CM | POA: Diagnosis not present

## 2023-07-05 ENCOUNTER — Ambulatory Visit (HOSPITAL_BASED_OUTPATIENT_CLINIC_OR_DEPARTMENT_OTHER): Attending: Internal Medicine | Admitting: Physical Therapy

## 2023-07-05 DIAGNOSIS — R2689 Other abnormalities of gait and mobility: Secondary | ICD-10-CM | POA: Diagnosis not present

## 2023-07-05 DIAGNOSIS — R293 Abnormal posture: Secondary | ICD-10-CM | POA: Insufficient documentation

## 2023-07-05 DIAGNOSIS — R278 Other lack of coordination: Secondary | ICD-10-CM | POA: Insufficient documentation

## 2023-07-05 DIAGNOSIS — M6281 Muscle weakness (generalized): Secondary | ICD-10-CM | POA: Insufficient documentation

## 2023-07-05 NOTE — Therapy (Signed)
 OUTPATIENT PHYSICAL THERAPY LOWER EXTREMITY TREATMENT                      Patient Name: Cole Martinez MRN: [id number] DOB:1945-06-23, 78 y.o., male Today's Date: 07/06/2023  END OF SESSION:  PT End of Session - 07/05/23 1200     Visit Number 16    Number of Visits 24    Date for PT Re-Evaluation 07/20/23    Authorization Type MCR A and B    PT Start Time 1156    PT Stop Time 1236    PT Time Calculation (min) 40 min    Equipment Utilized During Treatment Gait belt    Activity Tolerance Patient tolerated treatment well    Behavior During Therapy WFL for tasks assessed/performed                 Past Medical History:  Diagnosis Date   ADHD (attention deficit hyperactivity disorder)    B12 deficiency    BPH (benign prostatic hypertrophy)    Chronic lymphocytic leukemia (CLL), T-cell (HCC) DX 1996--  ONCOLOGIST-  DR Scherrie Curt   PT IS ASYMPTOMATIC--- LAST CBC W/ DIFF 06-24-2012 STABLE   Coronary artery disease CARDIOLOGIST- DR Kay Parson   S/P STENTING LAD 1999 // Myoview  01/2019: EF 62, normal perfusion; Low Risk   Crohn's disease of ileum (HCC) SINCE 1988   ED (erectile dysfunction)    Elevated PSA    Essential and other specified forms of tremor 11/25/2012   Gait abnormality 05/17/2020   H/O adenomatous polyp of colon    Hyperlipidemia    Hypertension    Nocturia    OA (osteoarthritis)    Peripheral neuropathy    hx of, none current as of 08-04-13   Peyronie disease    S/P coronary artery stent placement OCT 1999 OF LAD   Tremor, hereditary, benign MILD RIGHT HAND   Past Surgical History:  Procedure Laterality Date   CARDIOVASCULAR STRESS TEST  11-01-2010 DR Kay Parson   NORMAL PERFUSION STUDY/ EF 64%/ NO ISCHEMIA   cataract surgery  Bilateral feburary 2020   with lens placement    COLONOSCOPY WITH PROPOFOL  N/A 09/24/2012   Procedure: COLONOSCOPY WITH PROPOFOL ;  Surgeon: Garrett Kallman, MD;  Location: [facility];  Service: Endoscopy;  Laterality: N/A;    CORONARY ANGIOPLASTY WITH STENT PLACEMENT  OCT 1999   STENT OF LAD   CORONARY STENT INTERVENTION N/A 02/14/2019   Procedure: CORONARY STENT INTERVENTION;  Surgeon: Arty Binning, MD;  Location: [facility];  Service: Cardiovascular;  Laterality: N/A;   CYSTOSCOPY WITH URETHRAL DILATATION  03/12/2017   Procedure: CYSTOSCOPY WITH URETHRAL DILATATION;  Surgeon: Liliane Rei, MD;  Location: [facility];  Service: Orthopedics;;  Arden Kotyk, Resident Assisting   CYSTOSCOPY WITH URETHRAL DILATATION N/A 10/03/2018   Procedure: CYSTOSCOPY WITH URETHRAL BALLOON DILATATION WITH BILATERAL RETROGRADE PYELOGRAPHY;  Surgeon: Florencio Hunting, MD;  Location: [facility];  Service: Urology;  Laterality: N/A;   CYSTOSCOPY WITH URETHRAL DILATATION N/A 12/30/2020   Procedure: CYSTOSCOPY WITH BALLOON DILATATION OF URETHRAL STRICTURE;  Surgeon: Florencio Hunting, MD;  Location: [facility];  Service: Urology;  Laterality: N/A;   LAPAROSCOPIC INGUINAL HERNIA REPAIR Bilateral 01-10-2004   W/ MESH   LEFT HEART CATH AND CORONARY ANGIOGRAPHY N/A 02/13/2019   Procedure: LEFT HEART CATH AND CORONARY ANGIOGRAPHY;  Surgeon: Arty Binning, MD;  Location: [facility];  Service: Cardiovascular;  Laterality: N/A;   neck benign removed from neck  yrs ago   PROSTATE BIOPSY N/A 07/12/2012   Procedure: PROSTATE BIOPSY AND ULTRASOUND;  Surgeon: Edmund Gouge, MD;  [facility];  Service: Urology;  Laterality: N/A;   PROSTATE SURGERY  2002   tuna   REMOVAL LEFT NECK LYMPH NODE  1996   TONSILLECTOMY  CHILD   TOTAL KNEE ARTHROPLASTY Right 03/12/2017   Procedure: RIGHT TOTAL KNEE ARTHROPLASTY;  Surgeon: Liliane Rei, MD;  Location: [facility];  Service: Orthopedics;  Laterality: Right;  Adductor Block   TRANSURETHRAL RESECTION OF PROSTATE N/A 08/08/2013   Procedure: TRANSURETHRAL RESECTION OF THE PROSTATE WITH GYRUS INSTRUMENTS;  Surgeon: Edmund Gouge, MD;  Location: [facility];  Service: Urology;  Laterality:  N/A;   TRANSURETHRAL RESECTION OF PROSTATE     Patient Active Problem List   Diagnosis Date Noted   Immunization counseling 12/20/2022   Cellulitis 12/20/2022   Medication management 12/20/2022   Adult attention deficit disorder 06/04/2020   Chronic lymphocytic leukemia (HCC) 06/04/2020   Conductive hearing loss, bilateral 06/04/2020   Crohn's ileitis (HCC) 06/04/2020   Disorder of musculoskeletal system 06/04/2020   Erectile dysfunction 06/04/2020   Gastroesophageal reflux disease 06/04/2020   Gout 06/04/2020   History of adenomatous polyp of colon 06/04/2020   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   Hypothyroidism 06/04/2020   Increased frequency of urination 06/04/2020   Male hypogonadism 06/04/2020   Muscle pain 06/04/2020   Nocturia 06/04/2020   Obesity 06/04/2020   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   Osteopenia 06/04/2020   Peripheral neurogenic pain 06/04/2020   Recurrent falls 06/04/2020   Unspecified kyphosis, thoracic region 06/04/2020   Vitamin B12 deficiency 06/04/2020   Vitamin D deficiency 06/04/2020   Gait abnormality 05/17/2020   Ataxia 03/16/2020   Angina pectoris (HCC) 02/13/2019   Pseudophakia of both eyes 08/01/2018   History of total knee arthroplasty 03/29/2017   Stiffness of right knee 03/16/2017   OA (osteoarthritis) of knee 03/12/2017   Tremor, essential 12/03/2015   Essential hypertension 12/09/2014   CAD (coronary artery disease) 12/08/2013   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   Pure hypercholesterolemia 12/08/2013   Benign prostatic hyperplasia 08/08/2013   Essential and other specified forms of tremor 11/25/2012    PCP: Benedetta Bradley, MD  REFERRING PROVIDER: Benedetta Bradley, MD  REFERRING DIAG: R26.89  Balance disorder              R26.81  Gait Instability    M62.81 lower extremity weakness  THERAPY DIAG:  Other abnormalities of gait and mobility  Other lack of coordination  Muscle  weakness (generalized)  Rationale for Evaluation and Treatment: Rehabilitation  ONSET DATE: PT order 03/20/2023  SUBJECTIVE:   SUBJECTIVE STATEMENT: Pt denies any adverse effects after prior Rx.  Pt denies any falls since before last land therapy.  Pt states he has performed his HEP twice since receiving them.    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   PRECAUTIONS: Fall and Other: R TKA, Hx of CLL   WEIGHT BEARING RESTRICTIONS: No  FALLS:  Has patient fallen in last 6 months? Yes. Number of falls 1 when slipping on ice .  3 now.  06/28/23  LIVING ENVIRONMENT: Lives with: lives with their spouse Lives in:  2 story home with rails Stairs: yes Has following equipment at home:  cane, FWW  OCCUPATION: CPA, still works some  PLOF: Independent  PATIENT GOALS:  improve strength, feel more comfortable with walking, not feel that he is going to fall  OBJECTIVE:  Note: Objective measures were completed at Evaluation unless otherwise noted.  DIAGNOSTIC FINDINGS: N/A                                                                                                                                 TREATMENT DATE:   5/1 Nustep L6 bilat UE/LE's x 5 mins   Tandem gait with UE support   Step ups on 6 inch step with rail and CGA/min assist and approx 2 occsasions of mod assist  Lateral step ups on 6 inch step with UE assist on rail with CGA and occasional min assist  Alt toe taps on 8 inch step with occasional UE ssupport on rail with CGA and occasionally min assist Standing on airex with FA and NBOS x30 sec each Standing on airex with rhythmic stab's with FA x30 sec and NBOS 2x20-25 sec Marching on airex with single UE support 2x10 with min  assist and occasionally moderate to max assist Stepping over hurdles (4) with rail assist with a step to gait and reciprocal gait pattern with CGA Ambulating around hurdles with CGA and one occasion of mod assist for LOB Sidestepping around hurdles with CGA   DATE: 07/03/23 Pt seen for aquatic therapy today.  Treatment took place in water  3.5-4.75 ft in depth at the [facility] pool. Temp of water  was 91.  Pt entered/exited the pool via stairs with hand rail.   *walking forward, back and side stepping ue support yellow HB *tandem stance leading R/L UE add/abd using RBHB in 4.0 ft(good challenge)  Requires deeper water  to maintain position *tree pose ue support RBHB  *scapular retraction using riderband 2 x 12 elbows straight then bent *sitting balance on yellow noodle (swing like) (good challenge) *straddling noodle: cycling.  Requires ue support on yellow HB *ue support corner wall: suspended vertical hip add/abd; flex/ext   Pt requires the buoyancy and hydrostatic pressure of water  for support, and to offload joints by unweighting joint load by at least 50 % in navel deep water  and by at least 75-80% in chest to neck deep water .  Viscosity of the water  is needed for resistance of strengthening. Water  current perturbations provides challenge to standing balance requiring increased core activation.      PATIENT EDUCATION:  Education details: relevant anatomy, exercise form, HEP, POC, dx, and rationale of interventions. Person educated: Patient Education method: Explanation, demonstration, verbal and tactile cues Education comprehension: verbalized understanding, returned demonstration, verbal and tactile cues required  HOME EXERCISE PROGRAM: Access Code: [email or web address] URL: [email or web address] Date: 04/23/2023 Prepared by: Marnie Siren  Updated HEP: - HALF TANDEM STANCE  - 1 x daily - 5-7 x weekly - 2  reps - 20-30 seconds hold - Side Stepping with Counter  Support  - 1 x daily - 6-7 x weekly - 2-3 reps   ASSESSMENT:  CLINICAL IMPRESSION: PT performed exercises to improve balance, proprioception, and dynamic stability.  PT used a gait belt and pt does require assistance at times to correct LOB.  Pt requires cuing to perform alt toe taps on step correctly and not apply excessive pressure when putting foot on step.  He is challenged with tapping toes on step.  Pt requires frequent cuing for posture with step ups.  Pt had multiple LOB's with fwd step ups requiring PT assistance to correct.  Pt requires cuing for foot placement with lateral step up.  He gives good effort with all exercises/activities.  Pt responded well to Rx having no c/o's after Rx.        OBJECTIVE IMPAIRMENTS: Abnormal gait, decreased activity tolerance, decreased balance, decreased coordination, decreased endurance, decreased mobility, difficulty walking, decreased strength, and postural dysfunction.   ACTIVITY LIMITATIONS: carrying, lifting, standing, squatting, and locomotion level  PARTICIPATION LIMITATIONS: shopping and community activity  PERSONAL FACTORS: Time since onset of injury/illness/exacerbation and 3+ comorbidities: FXAS, R TKA, peripheral neuropathy, CLL  are also affecting patient's functional outcome.   REHAB POTENTIAL: Good  CLINICAL DECISION MAKING: Evolving/moderate complexity  EVALUATION COMPLEXITY: Moderate   GOALS:  SHORT TERM GOALS: Target date: 05/11/2023  Pt will be independent with HEP for improved balance, strength, and functional mobility. Baseline: Goal status: In progress 05/24/23  2.  Pt will demo improved stability with turning with gait.  Baseline:  Goal status: In progress 05/24/23  3.  Pt will perform TUG safely including turning and and performing stand to sit transfer with good control and without assistance.  Baseline:  Goal status: PROGRESSING  06/07/23  4.  Pt will be able to perform tandem stance for at least 20 seconds for  improved balance and stability with daily activities.  Baseline:  Goal status: in progress 05/24/23 Target date:  05/18/2023   LONG TERM GOALS: Target date: 06/01/2023   Pt will demo improved time on 5x STS test by 3 seconds and also improved form by not using his legs on the back of the chair for improved functional LE strength and performance of transfers.  Baseline:  Goal status: Un Progress 06/05/23  2.  Pt will demo improved R hip flexion strength to 5/5 MMT and bilat hip abd strength by at least 6-8 lbs for improved strength to perform functional mobility with increased ease and less difficulty.  Baseline:  Goal status: 66% MET   3.  (Will set Berg goal next visit after testing. )Pt will improve on Berg balance test to >/= 36/56 to demonstrate a decrease in fall risk. Baseline: 26/56 06/05/23 Goal status: in progress 06/05/23  4.  Pt will report at least a 70% improvement in stability with gait and community ambulation.  Baseline: 06/05/23 5% Goal status: In progress 06/05/23  5.  Pt will report improved confidence with his daily functional mobility.  Baseline:  Goal status: INITIAL     PLAN:  PT FREQUENCY: 2x/week  PT DURATION: 6 weeks  PLANNED INTERVENTIONS: 97164- PT Re-evaluation, 97110-Therapeutic exercises, 97530- Therapeutic activity, [id number]- Neuromuscular re-education, 97535- Self Care, 16109- Manual therapy, [phone]- Gait training, [id number]- Aquatic Therapy, Patient/Family education, Balance training, Stair training, and Taping  PLAN FOR NEXT SESSION: Cont with Balance and Gait training and strengthening.  Cont with aquatic and land based PT.   Mary Laneta Pintos) Ziemba  MPT 07/06/23 6:49 AM [facility] [address] [place], [place], [address] Phone: [phone]   Fax:  [phone]

## 2023-07-06 ENCOUNTER — Encounter (HOSPITAL_BASED_OUTPATIENT_CLINIC_OR_DEPARTMENT_OTHER): Payer: Self-pay | Admitting: Physical Therapy

## 2023-07-10 ENCOUNTER — Encounter (HOSPITAL_BASED_OUTPATIENT_CLINIC_OR_DEPARTMENT_OTHER): Payer: Self-pay | Admitting: Physical Therapy

## 2023-07-10 ENCOUNTER — Ambulatory Visit (HOSPITAL_BASED_OUTPATIENT_CLINIC_OR_DEPARTMENT_OTHER): Admitting: Physical Therapy

## 2023-07-10 DIAGNOSIS — R293 Abnormal posture: Secondary | ICD-10-CM | POA: Diagnosis not present

## 2023-07-10 DIAGNOSIS — R278 Other lack of coordination: Secondary | ICD-10-CM

## 2023-07-10 DIAGNOSIS — R2689 Other abnormalities of gait and mobility: Secondary | ICD-10-CM

## 2023-07-10 DIAGNOSIS — M6281 Muscle weakness (generalized): Secondary | ICD-10-CM | POA: Diagnosis not present

## 2023-07-10 NOTE — Therapy (Signed)
 OUTPATIENT PHYSICAL THERAPY LOWER EXTREMITY TREATMENT                      Patient Name: Cole Martinez MRN: [id number] DOB:September 07, 1945, 78 y.o., male Today's Date: 07/10/2023  END OF SESSION:  PT End of Session - 07/10/23 1403     Number of Visits 24    Date for PT Re-Evaluation 07/20/23    Authorization Type MCR A and B    PT Start Time 1401    PT Stop Time 1440    PT Time Calculation (min) 39 min    Equipment Utilized During Treatment Gait belt    Activity Tolerance Patient tolerated treatment well    Behavior During Therapy WFL for tasks assessed/performed                 Past Medical History:  Diagnosis Date   ADHD (attention deficit hyperactivity disorder)    B12 deficiency    BPH (benign prostatic hypertrophy)    Chronic lymphocytic leukemia (CLL), T-cell (HCC) DX 1996--  ONCOLOGIST-  DR Scherrie Curt   PT IS ASYMPTOMATIC--- LAST CBC W/ DIFF 06-24-2012 STABLE   Coronary artery disease CARDIOLOGIST- DR Kay Parson   S/P STENTING LAD 1999 // Myoview  01/2019: EF 62, normal perfusion; Low Risk   Crohn's disease of ileum (HCC) SINCE 1988   ED (erectile dysfunction)    Elevated PSA    Essential and other specified forms of tremor 11/25/2012   Gait abnormality 05/17/2020   H/O adenomatous polyp of colon    Hyperlipidemia    Hypertension    Nocturia    OA (osteoarthritis)    Peripheral neuropathy    hx of, none current as of 08-04-13   Peyronie disease    S/P coronary artery stent placement OCT 1999 OF LAD   Tremor, hereditary, benign MILD RIGHT HAND   Past Surgical History:  Procedure Laterality Date   CARDIOVASCULAR STRESS TEST  11-01-2010 DR Kay Parson   NORMAL PERFUSION STUDY/ EF 64%/ NO ISCHEMIA   cataract surgery  Bilateral feburary 2020   with lens placement    COLONOSCOPY WITH PROPOFOL  N/A 09/24/2012   Procedure: COLONOSCOPY WITH PROPOFOL ;  Surgeon: Garrett Kallman, MD;  Location: [facility];  Service: Endoscopy;  Laterality: N/A;   CORONARY  ANGIOPLASTY WITH STENT PLACEMENT  OCT 1999   STENT OF LAD   CORONARY STENT INTERVENTION N/A 02/14/2019   Procedure: CORONARY STENT INTERVENTION;  Surgeon: Arty Binning, MD;  Location: [facility];  Service: Cardiovascular;  Laterality: N/A;   CYSTOSCOPY WITH URETHRAL DILATATION  03/12/2017   Procedure: CYSTOSCOPY WITH URETHRAL DILATATION;  Surgeon: Liliane Rei, MD;  Location: [facility];  Service: Orthopedics;;  Arden Kotyk, Resident Assisting   CYSTOSCOPY WITH URETHRAL DILATATION N/A 10/03/2018   Procedure: CYSTOSCOPY WITH URETHRAL BALLOON DILATATION WITH BILATERAL RETROGRADE PYELOGRAPHY;  Surgeon: Florencio Hunting, MD;  Location: [facility];  Service: Urology;  Laterality: N/A;   CYSTOSCOPY WITH URETHRAL DILATATION N/A 12/30/2020   Procedure: CYSTOSCOPY WITH BALLOON DILATATION OF URETHRAL STRICTURE;  Surgeon: Florencio Hunting, MD;  Location: [facility];  Service: Urology;  Laterality: N/A;   LAPAROSCOPIC INGUINAL HERNIA REPAIR Bilateral 01-10-2004   W/ MESH   LEFT HEART CATH AND CORONARY ANGIOGRAPHY N/A 02/13/2019   Procedure: LEFT HEART CATH AND CORONARY ANGIOGRAPHY;  Surgeon: Arty Binning, MD;  Location: [facility];  Service: Cardiovascular;  Laterality: N/A;   neck benign removed from neck  yrs ago   PROSTATE BIOPSY  N/A 07/12/2012   Procedure: PROSTATE BIOPSY AND ULTRASOUND;  Surgeon: Edmund Gouge, MD;  Location[facility];  Service: Urology;  Laterality: N/A;   PROSTATE SURGERY  2002   tuna   REMOVAL LEFT NECK LYMPH NODE  1996   TONSILLECTOMY  CHILD   TOTAL KNEE ARTHROPLASTY Right 03/12/2017   Procedure: RIGHT TOTAL KNEE ARTHROPLASTY;  Surgeon: Liliane Rei, MD;  Location: [facility];  Service: Orthopedics;  Laterality: Right;  Adductor Block   TRANSURETHRAL RESECTION OF PROSTATE N/A 08/08/2013   Procedure: TRANSURETHRAL RESECTION OF THE PROSTATE WITH GYRUS INSTRUMENTS;  Surgeon: Edmund Gouge, MD;  Location: [facility];  Service: Urology;  Laterality: N/A;    TRANSURETHRAL RESECTION OF PROSTATE     Patient Active Problem List   Diagnosis Date Noted   Immunization counseling 12/20/2022   Cellulitis 12/20/2022   Medication management 12/20/2022   Adult attention deficit disorder 06/04/2020   Chronic lymphocytic leukemia (HCC) 06/04/2020   Conductive hearing loss, bilateral 06/04/2020   Crohn's ileitis (HCC) 06/04/2020   Disorder of musculoskeletal system 06/04/2020   Erectile dysfunction 06/04/2020   Gastroesophageal reflux disease 06/04/2020   Gout 06/04/2020   History of adenomatous polyp of colon 06/04/2020   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   Hypothyroidism 06/04/2020   Increased frequency of urination 06/04/2020   Male hypogonadism 06/04/2020   Muscle pain 06/04/2020   Nocturia 06/04/2020   Obesity 06/04/2020   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   Osteopenia 06/04/2020   Peripheral neurogenic pain 06/04/2020   Recurrent falls 06/04/2020   Unspecified kyphosis, thoracic region 06/04/2020   Vitamin B12 deficiency 06/04/2020   Vitamin D deficiency 06/04/2020   Gait abnormality 05/17/2020   Ataxia 03/16/2020   Angina pectoris (HCC) 02/13/2019   Pseudophakia of both eyes 08/01/2018   History of total knee arthroplasty 03/29/2017   Stiffness of right knee 03/16/2017   OA (osteoarthritis) of knee 03/12/2017   Tremor, essential 12/03/2015   Essential hypertension 12/09/2014   CAD (coronary artery disease) 12/08/2013   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   Pure hypercholesterolemia 12/08/2013   Benign prostatic hyperplasia 08/08/2013   Essential and other specified forms of tremor 11/25/2012    PCP: Benedetta Bradley, MD  REFERRING PROVIDER: Benedetta Bradley, MD  REFERRING DIAG: R26.89  Balance disorder              R26.81  Gait Instability    M62.81 lower extremity weakness  THERAPY DIAG:  Other abnormalities of gait and mobility  Other lack of coordination  Muscle weakness  (generalized)  Abnormal posture  Rationale for Evaluation and Treatment: Rehabilitation  ONSET DATE: PT order 03/20/2023  SUBJECTIVE:   SUBJECTIVE STATEMENT: Pt denies any adverse effects after prior Rx.  Pt denies any falls since before last land therapy.     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   PRECAUTIONS: Fall and Other: R TKA, Hx of CLL   WEIGHT BEARING RESTRICTIONS: No  FALLS:  Has patient fallen in last 6 months? Yes. Number of falls 1 when slipping on ice .  3 now.  06/28/23  LIVING ENVIRONMENT: Lives with: lives with their spouse Lives in: 2 story home with rails Stairs: yes Has following equipment at home:  cane,  FWW  OCCUPATION: IT trainer, still works some  PLOF: Independent  PATIENT GOALS:  improve strength, feel more comfortable with walking, not feel that he is going to fall  OBJECTIVE:  Note: Objective measures were completed at Evaluation unless otherwise noted.  DIAGNOSTIC FINDINGS: N/A                                                                                                                                 TREATMENT DATE:   5/1 Nustep L6 bilat UE/LE's x 5 mins   Tandem gait with UE support   Step ups on 6 inch step with rail and CGA/min assist and approx 2 occsasions of mod assist  Lateral step ups on 6 inch step with UE assist on rail with CGA and occasional min assist  Alt toe taps on 8 inch step with occasional UE ssupport on rail with CGA and occasionally min assist Standing on airex with FA and NBOS x30 sec each Standing on airex with rhythmic stab's with FA x30 sec and NBOS 2x20-25 sec Marching on airex with single UE support 2x10 with min assist and occasionally moderate to max  assist Stepping over hurdles (4) with rail assist with a step to gait and reciprocal gait pattern with CGA Ambulating around hurdles with CGA and one occasion of mod assist for LOB Sidestepping around hurdles with CGA   DATE: 07/03/23 Pt seen for aquatic therapy today.  Treatment took place in water  3.5-4.75 ft in depth at the [facility] pool. Temp of water  was 91.  Pt entered/exited the pool via stairs with hand rail.   *walking forward, back and side stepping ue support yellow HB *tandem stance leading R/L UE add/abd using RBHB in 4.0 ft(good challenge)  *SLR *tree pose ue support RBHB *solid noodle press wide stance then staggered stance *sitting balance swing like yellow noodle (good challenge) *straddling noodle: cycling.  Requires ue support on yellow HB   *scapular retraction using riderband 2 x 12 elbows straight then bent *sitting balance on yellow noodle (swing like) (good challenge)  *ue support corner wall: suspended vertical hip add/abd; flex/ext   Pt requires the buoyancy and hydrostatic pressure of water  for support, and to offload joints by unweighting joint load by at least 50 % in navel deep water  and by at least 75-80% in chest to neck deep water .  Viscosity of the water  is needed for resistance of strengthening. Water  current perturbations provides challenge to standing balance requiring increased core activation.      PATIENT EDUCATION:  Education details: relevant anatomy, exercise form, HEP, POC, dx, and rationale of interventions. Person educated: Patient Education method: Explanation, demonstration, verbal and tactile cues Education comprehension: verbalized understanding, returned demonstration, verbal and tactile cues required  HOME EXERCISE PROGRAM: Access Code: [email or web address] URL: [email or web address] Date: 04/23/2023 Prepared by: Marnie Siren  Updated HEP: - HALF TANDEM STANCE  - 1 x daily - 5-7 x weekly - 2 reps -  20-30  seconds hold - Side Stepping with Counter Support  - 1 x daily - 6-7 x weekly - 2-3 reps   ASSESSMENT:  CLINICAL IMPRESSION: Progressed balance and postural retraining.  He demonstrates good tolerance.  Improved SLS static and dynamic today. Immediate standing balance improved as well rising from 4th to bottom (progression) with add set 3/5x without LOB. Did have difficulty with sitting balance on noodle. Goals ongoing        OBJECTIVE IMPAIRMENTS: Abnormal gait, decreased activity tolerance, decreased balance, decreased coordination, decreased endurance, decreased mobility, difficulty walking, decreased strength, and postural dysfunction.   ACTIVITY LIMITATIONS: carrying, lifting, standing, squatting, and locomotion level  PARTICIPATION LIMITATIONS: shopping and community activity  PERSONAL FACTORS: Time since onset of injury/illness/exacerbation and 3+ comorbidities: FXAS, R TKA, peripheral neuropathy, CLL  are also affecting patient's functional outcome.   REHAB POTENTIAL: Good  CLINICAL DECISION MAKING: Evolving/moderate complexity  EVALUATION COMPLEXITY: Moderate   GOALS:  SHORT TERM GOALS: Target date: 05/11/2023  Pt will be independent with HEP for improved balance, strength, and functional mobility. Baseline: Goal status: In progress 05/24/23  2.  Pt will demo improved stability with turning with gait.  Baseline:  Goal status: In progress 05/24/23  3.  Pt will perform TUG safely including turning and and performing stand to sit transfer with good control and without assistance.  Baseline:  Goal status: PROGRESSING  06/07/23  4.  Pt will be able to perform tandem stance for at least 20 seconds for improved balance and stability with daily activities.  Baseline:  Goal status: in progress 05/24/23 Target date:  05/18/2023   LONG TERM GOALS: Target date: 06/01/2023   Pt will demo improved time on 5x STS test by 3 seconds and also improved form by not using his legs  on the back of the chair for improved functional LE strength and performance of transfers.  Baseline:  Goal status: Un Progress 06/05/23  2.  Pt will demo improved R hip flexion strength to 5/5 MMT and bilat hip abd strength by at least 6-8 lbs for improved strength to perform functional mobility with increased ease and less difficulty.  Baseline:  Goal status: 66% MET   3.  (Will set Berg goal next visit after testing. )Pt will improve on Berg balance test to >/= 36/56 to demonstrate a decrease in fall risk. Baseline: 26/56 06/05/23 Goal status: in progress 06/05/23  4.  Pt will report at least a 70% improvement in stability with gait and community ambulation.  Baseline: 06/05/23 5% Goal status: In progress 06/05/23  5.  Pt will report improved confidence with his daily functional mobility.  Baseline:  Goal status: INITIAL     PLAN:  PT FREQUENCY: 2x/week  PT DURATION: 6 weeks  PLANNED INTERVENTIONS: [id number]- PT Re-evaluation, 97110-Therapeutic exercises, 97530- Therapeutic activity, [id number]- Neuromuscular re-education, 97535- Self Care, 13244- Manual therapy, [phone]- Gait training, [phone]- Aquatic Therapy, Patient/Family education, Balance training, Stair training, and Taping  PLAN FOR NEXT SESSION: Cont with Balance and Gait training and strengthening.  Cont with aquatic and land based PT.   Adriana [place]) Soraida Vickers MPT 07/10/23 2:46 PM [facility] [address] [place], [place], [address] Phone: [phone]   Fax:  [phone]

## 2023-07-11 DIAGNOSIS — I2510 Atherosclerotic heart disease of native coronary artery without angina pectoris: Secondary | ICD-10-CM | POA: Diagnosis not present

## 2023-07-12 ENCOUNTER — Ambulatory Visit (HOSPITAL_BASED_OUTPATIENT_CLINIC_OR_DEPARTMENT_OTHER): Admitting: Physical Therapy

## 2023-07-12 ENCOUNTER — Encounter (HOSPITAL_BASED_OUTPATIENT_CLINIC_OR_DEPARTMENT_OTHER): Payer: Self-pay | Admitting: Physical Therapy

## 2023-07-12 DIAGNOSIS — M6281 Muscle weakness (generalized): Secondary | ICD-10-CM | POA: Diagnosis not present

## 2023-07-12 DIAGNOSIS — R2689 Other abnormalities of gait and mobility: Secondary | ICD-10-CM

## 2023-07-12 DIAGNOSIS — R293 Abnormal posture: Secondary | ICD-10-CM | POA: Diagnosis not present

## 2023-07-12 DIAGNOSIS — R278 Other lack of coordination: Secondary | ICD-10-CM

## 2023-07-12 NOTE — Therapy (Signed)
 OUTPATIENT PHYSICAL THERAPY LOWER EXTREMITY TREATMENT                      Patient Name: Cole Martinez MRN: [id number] DOB:11-12-1945, 78 y.o., male Today's Date: 07/13/2023  END OF SESSION:  PT End of Session - 07/12/23 1201     Visit Number 18    Number of Visits 24    Date for PT Re-Evaluation 07/20/23    Authorization Type MCR A and B    PT Start Time 1158    PT Stop Time 1240    PT Time Calculation (min) 42 min    Equipment Utilized During Treatment Gait belt    Activity Tolerance Patient tolerated treatment well    Behavior During Therapy WFL for tasks assessed/performed                 Past Medical History:  Diagnosis Date   ADHD (attention deficit hyperactivity disorder)    B12 deficiency    BPH (benign prostatic hypertrophy)    Chronic lymphocytic leukemia (CLL), T-cell (HCC) DX 1996--  ONCOLOGIST-  DR Scherrie Curt   PT IS ASYMPTOMATIC--- LAST CBC W/ DIFF 06-24-2012 STABLE   Coronary artery disease CARDIOLOGIST- DR Kay Parson   S/P STENTING LAD 1999 // Myoview  01/2019: EF 62, normal perfusion; Low Risk   Crohn's disease of ileum (HCC) SINCE 1988   ED (erectile dysfunction)    Elevated PSA    Essential and other specified forms of tremor 11/25/2012   Gait abnormality 05/17/2020   H/O adenomatous polyp of colon    Hyperlipidemia    Hypertension    Nocturia    OA (osteoarthritis)    Peripheral neuropathy    hx of, none current as of 08-04-13   Peyronie disease    S/P coronary artery stent placement OCT 1999 OF LAD   Tremor, hereditary, benign MILD RIGHT HAND   Past Surgical History:  Procedure Laterality Date   CARDIOVASCULAR STRESS TEST  11-01-2010 DR Kay Parson   NORMAL PERFUSION STUDY/ EF 64%/ NO ISCHEMIA   cataract surgery  Bilateral feburary 2020   with lens placement    COLONOSCOPY WITH PROPOFOL  N/A 09/24/2012   Procedure: COLONOSCOPY WITH PROPOFOL ;  Surgeon: Garrett Kallman, MD;  Location: [facility];  Service: Endoscopy;  Laterality: N/A;    CORONARY ANGIOPLASTY WITH STENT PLACEMENT  OCT 1999   STENT OF LAD   CORONARY STENT INTERVENTION N/A 02/14/2019   Procedure: CORONARY STENT INTERVENTION;  Surgeon: Arty Binning, MD;  Location: [facility];  Service: Cardiovascular;  Laterality: N/A;   CYSTOSCOPY WITH URETHRAL DILATATION  03/12/2017   Procedure: CYSTOSCOPY WITH URETHRAL DILATATION;  Surgeon: Liliane Rei, MD;  Location: [facility];  Service: Orthopedics;;  Arden Kotyk, Resident Assisting   CYSTOSCOPY WITH URETHRAL DILATATION N/A 10/03/2018   Procedure: CYSTOSCOPY WITH URETHRAL BALLOON DILATATION WITH BILATERAL RETROGRADE PYELOGRAPHY;  Surgeon: Florencio Hunting, MD;  Location: [facility];  Service: Urology;  Laterality: N/A;   CYSTOSCOPY WITH URETHRAL DILATATION N/A 12/30/2020   Procedure: CYSTOSCOPY WITH BALLOON DILATATION OF URETHRAL STRICTURE;  Surgeon: Florencio Hunting, MD;  Location: [facility];  Service: Urology;  Laterality: N/A;   LAPAROSCOPIC INGUINAL HERNIA REPAIR Bilateral 01-10-2004   W/ MESH   LEFT HEART CATH AND CORONARY ANGIOGRAPHY N/A 02/13/2019   Procedure: LEFT HEART CATH AND CORONARY ANGIOGRAPHY;  Surgeon: Arty Binning, MD;  Location: [facility];  Service: Cardiovascular;  Laterality: N/A;   neck benign removed from neck  yrs ago   PROSTATE BIOPSY N/A 07/12/2012   Procedure: PROSTATE BIOPSY AND ULTRASOUND;  Surgeon: Edmund Gouge, MD;  Location: [facility];  Service: Urology;  Laterality: N/A;   PROSTATE SURGERY  2002   tuna   REMOVAL LEFT NECK LYMPH NODE  1996   TONSILLECTOMY  CHILD   TOTAL KNEE ARTHROPLASTY Right 03/12/2017   Procedure: RIGHT TOTAL KNEE ARTHROPLASTY;  Surgeon: Liliane Rei, MD;  Location: [facility];  Service: Orthopedics;  Laterality: Right;  Adductor Block   TRANSURETHRAL RESECTION OF PROSTATE N/A 08/08/2013   Procedure: TRANSURETHRAL RESECTION OF THE PROSTATE WITH GYRUS INSTRUMENTS;  Surgeon: Edmund Gouge, MD;  Location: [facility];  Service: Urology;  Laterality:  N/A;   TRANSURETHRAL RESECTION OF PROSTATE     Patient Active Problem List   Diagnosis Date Noted   Immunization counseling 12/20/2022   Cellulitis 12/20/2022   Medication management 12/20/2022   Adult attention deficit disorder 06/04/2020   Chronic lymphocytic leukemia (HCC) 06/04/2020   Conductive hearing loss, bilateral 06/04/2020   Crohn's ileitis (HCC) 06/04/2020   Disorder of musculoskeletal system 06/04/2020   Erectile dysfunction 06/04/2020   Gastroesophageal reflux disease 06/04/2020   Gout 06/04/2020   History of adenomatous polyp of colon 06/04/2020   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   Hypothyroidism 06/04/2020   Increased frequency of urination 06/04/2020   Male hypogonadism 06/04/2020   Muscle pain 06/04/2020   Nocturia 06/04/2020   Obesity 06/04/2020   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   Osteopenia 06/04/2020   Peripheral neurogenic pain 06/04/2020   Recurrent falls 06/04/2020   Unspecified kyphosis, thoracic region 06/04/2020   Vitamin B12 deficiency 06/04/2020   Vitamin D deficiency 06/04/2020   Gait abnormality 05/17/2020   Ataxia 03/16/2020   Angina pectoris (HCC) 02/13/2019   Pseudophakia of both eyes 08/01/2018   History of total knee arthroplasty 03/29/2017   Stiffness of right knee 03/16/2017   OA (osteoarthritis) of knee 03/12/2017   Tremor, essential 12/03/2015   Essential hypertension 12/09/2014   CAD (coronary artery disease) 12/08/2013   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   Pure hypercholesterolemia 12/08/2013   Benign prostatic hyperplasia 08/08/2013   Essential and other specified forms of tremor 11/25/2012    PCP: Benedetta Bradley, MD  REFERRING PROVIDER: Benedetta Bradley, MD  REFERRING DIAG: R26.89  Balance disorder              R26.81  Gait Instability    M62.81 lower extremity weakness  THERAPY DIAG:  Other abnormalities of gait and mobility  Other lack of coordination  Muscle  weakness (generalized)  Rationale for Evaluation and Treatment: Rehabilitation  ONSET DATE: PT order 03/20/2023  SUBJECTIVE:   SUBJECTIVE STATEMENT: Pt denies any adverse effects after prior Rx.  Pt denies any falls since before last land therapy.  Pt states he has performed his HEP twice since receiving the new exercises.    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   PRECAUTIONS: Fall and Other: R TKA, Hx of CLL   WEIGHT BEARING RESTRICTIONS: No  FALLS:  Has patient fallen in last 6 months? Yes. Number of falls 1 when slipping on ice.  3 now.  06/28/23  LIVING ENVIRONMENT: Lives with: lives with their spouse Lives  in: 2 story home with rails Stairs: yes Has following equipment at home:  cane, FWW  OCCUPATION: CPA, still works some  PLOF: Independent  PATIENT GOALS:  improve strength, feel more comfortable with walking, not feel that he is going to fall  OBJECTIVE:  Note: Objective measures were completed at Evaluation unless otherwise noted.  DIAGNOSTIC FINDINGS: N/A                                                                                                                                 TREATMENT DATE:   5/8  Nustep L6 bilat UE/LE's x 5 mins   Step ups on 6 inch step with rail and CGA/min assist and  Lateral step ups on 6 inch step with UE assist on rail with CGA and occasional min assist  Alt toe taps on 8 inch step with occasional UE ssupport on rail with CGA and occasionally min assist  Ambulating fwd, sidestepping, and bwd ambulation around 4 cones in a box pattern x 5 reps with CGA/min assist  Stepping over hurdles (4) with rail assist with a step to gait and reciprocal gait pattern with CGA.  Pt also  performed with with the airex pad at the end of the hurdles  Lateral stepping over hurdles (4) with rail assist and CGA  Tandem gait with UE support     PATIENT EDUCATION:  Education details: relevant anatomy, exercise form, HEP, POC, dx, and rationale of interventions. Person educated: Patient Education method: Explanation, demonstration, verbal and tactile cues Education comprehension: verbalized understanding, returned demonstration, verbal and tactile cues required  HOME EXERCISE PROGRAM: Access Code: [email or web address] URL: [email or web address] Date: 04/23/2023 Prepared by: Marnie Siren  Updated HEP: - HALF TANDEM STANCE  - 1 x daily - 5-7 x weekly - 2 reps - 20-30 seconds hold - Side Stepping with Counter Support  - 1 x daily - 6-7 x weekly - 2-3 reps   ASSESSMENT:  CLINICAL IMPRESSION: PT performed exercises to improve balance, proprioception, and dynamic stability.  PT used a gait belt t/o Rx.  Pt requires cuing for correct placement with stepping and for posture during step ups.  Pt also requires assistance for steadiness and occasional LOB with step ups.  PT had pt perform amb fwd/bwd and sidestepping in a box pattern.  He did well overall ambulating bwds requiring min assist on one occasion.  He did require some assistance with sidestepping and transitioning from one movement to the next.  Pt did well with reciprocal gait over hurdles and demonstrates increased step length but does require UE support on rail.  Pt required cuing for foot placement and stepping with lateral stepping over hurdles.  He responded well to Rx having no c/o's after Rx.        OBJECTIVE IMPAIRMENTS: Abnormal gait, decreased activity tolerance, decreased balance, decreased coordination, decreased endurance, decreased mobility, difficulty walking, decreased strength, and postural dysfunction.   ACTIVITY LIMITATIONS: carrying, lifting,  standing, squatting, and locomotion  level  PARTICIPATION LIMITATIONS: shopping and community activity  PERSONAL FACTORS: Time since onset of injury/illness/exacerbation and 3+ comorbidities: FXAS, R TKA, peripheral neuropathy, CLL are also affecting patient's functional outcome.   REHAB POTENTIAL: Good  CLINICAL DECISION MAKING: Evolving/moderate complexity  EVALUATION COMPLEXITY: Moderate   GOALS:  SHORT TERM GOALS: Target date: 05/11/2023  Pt will be independent with HEP for improved balance, strength, and functional mobility. Baseline: Goal status: In progress 05/24/23  2.  Pt will demo improved stability with turning with gait.  Baseline:  Goal status: In progress 05/24/23  3.  Pt will perform TUG safely including turning and and performing stand to sit transfer with good control and without assistance.  Baseline:  Goal status: PROGRESSING  06/07/23  4.  Pt will be able to perform tandem stance for at least 20 seconds for improved balance and stability with daily activities.  Baseline:  Goal status: in progress 05/24/23 Target date:  05/18/2023   LONG TERM GOALS: Target date: 06/01/2023   Pt will demo improved time on 5x STS test by 3 seconds and also improved form by not using his legs on the back of the chair for improved functional LE strength and performance of transfers.  Baseline:  Goal status: Un Progress 06/05/23  2.  Pt will demo improved R hip flexion strength to 5/5 MMT and bilat hip abd strength by at least 6-8 lbs for improved strength to perform functional mobility with increased ease and less difficulty.  Baseline:  Goal status: 66% MET   3.  (Will set Berg goal next visit after testing. )Pt will improve on Berg balance test to >/= 36/56 to demonstrate a decrease in fall risk. Baseline: 26/56 06/05/23 Goal status: in progress 06/05/23  4.  Pt will report at least a 70% improvement in stability with gait and community ambulation.  Baseline: 06/05/23 5% Goal status: In progress 06/05/23  5.  Pt will  report improved confidence with his daily functional mobility.  Baseline:  Goal status: INITIAL     PLAN:  PT FREQUENCY: 2x/week  PT DURATION: 6 weeks  PLANNED INTERVENTIONS: 97164- PT Re-evaluation, 97110-Therapeutic exercises, 97530- Therapeutic activity, [id number]- Neuromuscular re-education, 97535- Self Care, 13244- Manual therapy, [phone]- Gait training, [phone]- Aquatic Therapy, Patient/Family education, Balance training, Stair training, and Taping  PLAN FOR NEXT SESSION: Cont with Balance and Gait training and strengthening.  Cont with aquatic and land based PT.   Trina Fujita III PT, DPT 07/13/23 8:59 PM

## 2023-07-13 ENCOUNTER — Encounter: Payer: Self-pay | Admitting: Dietician

## 2023-07-13 ENCOUNTER — Encounter (HOSPITAL_BASED_OUTPATIENT_CLINIC_OR_DEPARTMENT_OTHER): Payer: Self-pay | Admitting: Physical Therapy

## 2023-07-13 ENCOUNTER — Encounter (HOSPITAL_COMMUNITY): Payer: Self-pay

## 2023-07-13 ENCOUNTER — Encounter: Payer: Self-pay | Admitting: Physician Assistant

## 2023-07-13 ENCOUNTER — Telehealth (HOSPITAL_COMMUNITY): Payer: Self-pay | Admitting: Emergency Medicine

## 2023-07-13 ENCOUNTER — Encounter: Payer: Medicare Other | Attending: Internal Medicine | Admitting: Dietician

## 2023-07-13 DIAGNOSIS — R7303 Prediabetes: Secondary | ICD-10-CM | POA: Diagnosis not present

## 2023-07-13 DIAGNOSIS — R072 Precordial pain: Secondary | ICD-10-CM

## 2023-07-13 NOTE — Progress Notes (Signed)
 On 07/13/2023 completed Session 11 of Diabetes Prevention Program course  with [facility]. By the end of this session patients are able to complete the following objectives:   Learning Objectives: Give examples of negative thoughts that could prevent them from meeting their goals of losing weight and being more physically active.  Describe how to stop negative thoughts and talk back to them with positive thoughts.  Practice 1) stopping negative thoughts and 2) talking back to negative thoughts with positive ones.    Goals:  Record weight taken outside of class.  Track foods and beverages eaten each day in the "Food and Activity Tracker," including calories and fat grams for each item.   Track activity type, minutes you were active, and distance you reached each day in the "Food and Activity Tracker."  If you have any negative thoughts-write them in your Food and Activity Trackers, along with how you talked back to them. Practice stopping negative thoughts and talking back to them with positive thoughts.   Follow-Up Plan: Attend Core Session 12 next week.  Bring completed "Food and Activity Tracker" next week to be reviewed by Lifestyle Coach.

## 2023-07-13 NOTE — Telephone Encounter (Signed)
 Reaching out to patient to offer assistance regarding upcoming cardiac imaging study; pt verbalizes understanding of appt date/time, parking situation and where to check in, pre-test NPO status and medications ordered, and verified current allergies; name and call back number provided for further questions should they arise Rockwell Alexandria RN Navigator Cardiac Imaging [facility] [phone] office [phone] cell

## 2023-07-17 ENCOUNTER — Ambulatory Visit (HOSPITAL_COMMUNITY)
Admission: RE | Admit: 2023-07-17 | Discharge: 2023-07-17 | Disposition: A | Discharge status: Home / Self Care | Payer: Medicare Other | Source: Ambulatory Visit | Attending: Physician Assistant | Admitting: Physician Assistant

## 2023-07-17 DIAGNOSIS — R072 Precordial pain: Secondary | ICD-10-CM | POA: Diagnosis not present

## 2023-07-17 DIAGNOSIS — I2510 Atherosclerotic heart disease of native coronary artery without angina pectoris: Secondary | ICD-10-CM | POA: Diagnosis not present

## 2023-07-17 LAB — NM PET CT CARDIAC PERFUSION MULTI W/ABSOLUTE BLOODFLOW
MBFR: 2.16
Nuc Rest EF: 52 %
Nuc Stress EF: 57 %
Rest MBF: 0.67 ml/g/min
Rest Nuclear Isotope Dose: 20 mCi
ST Depression (mm): 0 mm
Stress MBF: 1.45 ml/g/min
Stress Nuclear Isotope Dose: 20 mCi
TID: 1

## 2023-07-17 MED ORDER — REGADENOSON 0.4 MG/5ML IV SOLN
0.4000 mg | Freq: Once | INTRAVENOUS | Status: AC
  Administered 2023-07-17: 0.4 mg via INTRAVENOUS

## 2023-07-17 MED ORDER — RUBIDIUM RB82 GENERATOR (RUBYFILL)
19.9000 | PACK | Freq: Once | INTRAVENOUS | Status: AC
  Administered 2023-07-17: 19.9 via INTRAVENOUS

## 2023-07-17 MED ORDER — REGADENOSON 0.4 MG/5ML IV SOLN
INTRAVENOUS | Status: AC
  Filled 2023-07-17: qty 5

## 2023-07-18 ENCOUNTER — Encounter (HOSPITAL_BASED_OUTPATIENT_CLINIC_OR_DEPARTMENT_OTHER): Payer: Self-pay | Admitting: Physical Therapy

## 2023-07-18 ENCOUNTER — Ambulatory Visit (HOSPITAL_BASED_OUTPATIENT_CLINIC_OR_DEPARTMENT_OTHER): Payer: Self-pay | Admitting: Physical Therapy

## 2023-07-18 DIAGNOSIS — M6281 Muscle weakness (generalized): Secondary | ICD-10-CM

## 2023-07-18 DIAGNOSIS — R2689 Other abnormalities of gait and mobility: Secondary | ICD-10-CM

## 2023-07-18 DIAGNOSIS — R293 Abnormal posture: Secondary | ICD-10-CM | POA: Diagnosis not present

## 2023-07-18 DIAGNOSIS — R278 Other lack of coordination: Secondary | ICD-10-CM | POA: Diagnosis not present
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
## 2023-07-19 ENCOUNTER — Ambulatory Visit: Payer: Self-pay | Admitting: Physician Assistant

## 2023-07-20 ENCOUNTER — Ambulatory Visit (HOSPITAL_BASED_OUTPATIENT_CLINIC_OR_DEPARTMENT_OTHER): Admitting: Physical Therapy

## 2023-07-20 ENCOUNTER — Encounter (HOSPITAL_BASED_OUTPATIENT_CLINIC_OR_DEPARTMENT_OTHER): Payer: Medicare Other | Admitting: Dietician

## 2023-07-20 ENCOUNTER — Encounter (HOSPITAL_BASED_OUTPATIENT_CLINIC_OR_DEPARTMENT_OTHER): Payer: Self-pay | Admitting: Physical Therapy

## 2023-07-20 ENCOUNTER — Encounter: Payer: Self-pay | Admitting: Dietician

## 2023-07-20 DIAGNOSIS — R278 Other lack of coordination: Secondary | ICD-10-CM | POA: Diagnosis not present

## 2023-07-20 DIAGNOSIS — R2689 Other abnormalities of gait and mobility: Secondary | ICD-10-CM | POA: Diagnosis not present

## 2023-07-20 DIAGNOSIS — R7303 Prediabetes: Secondary | ICD-10-CM

## 2023-07-20 DIAGNOSIS — R293 Abnormal posture: Secondary | ICD-10-CM | POA: Diagnosis not present

## 2023-07-20 DIAGNOSIS — M6281 Muscle weakness (generalized): Secondary | ICD-10-CM

## 2023-07-20 NOTE — Therapy (Signed)
 OUTPATIENT PHYSICAL THERAPY LOWER EXTREMITY TREATMENT  Progress Note Reporting Period 06/07/2023 to 07/20/2023  See note below for Objective Data and Assessment of Progress/Goals.                          Patient Name: Cole Martinez MRN: [id number] DOB:1945/05/08, 78 y.o., male Today's Date: 07/20/2023  END OF SESSION:  PT End of Session - 07/20/23 1048     Visit Number 20    Number of Visits 28    Date for PT Re-Evaluation 08/31/23    Authorization Type MCR A and B    PT Start Time 1042   Pt arrived 27 mins late to treatment   PT Stop Time 1110    PT Time Calculation (min) 28 min    Activity Tolerance Patient tolerated treatment well    Behavior During Therapy WFL for tasks assessed/performed                   Past Medical History:  Diagnosis Date   ADHD (attention deficit hyperactivity disorder)    B12 deficiency    BPH (benign prostatic hypertrophy)    Chronic lymphocytic leukemia (CLL), T-cell (HCC) DX 1996--  ONCOLOGIST-  DR Scherrie Curt   PT IS ASYMPTOMATIC--- LAST CBC W/ DIFF 06-24-2012 STABLE   Coronary artery disease CARDIOLOGIST- DR Kay Parson   S/P STENTING LAD 1999 // Myoview  01/2019: EF 62, normal perfusion; Low Risk   Crohn's disease of ileum (HCC) SINCE 1988   ED (erectile dysfunction)    Elevated PSA    Essential and other specified forms of tremor 11/25/2012   Gait abnormality 05/17/2020   H/O adenomatous polyp of colon    Hyperlipidemia    Hypertension    Nocturia    OA (osteoarthritis)    Peripheral neuropathy    hx of, none current as of 08-04-13   Peyronie disease    S/P coronary artery stent placement OCT 1999 OF LAD   Tremor, hereditary, benign MILD RIGHT HAND   Past Surgical History:  Procedure Laterality Date   CARDIOVASCULAR STRESS TEST  11-01-2010 DR Kay Parson   NORMAL PERFUSION STUDY/ EF 64%/ NO ISCHEMIA   cataract surgery  Bilateral feburary 2020   with lens placement    COLONOSCOPY WITH PROPOFOL  N/A 09/24/2012    Procedure: COLONOSCOPY WITH PROPOFOL ;  Surgeon: Garrett Kallman, MD;  Location: [facility];  Service: Endoscopy;  Laterality: N/A;   CORONARY ANGIOPLASTY WITH STENT PLACEMENT  OCT 1999   STENT OF LAD   CORONARY STENT INTERVENTION N/A 02/14/2019   Procedure: CORONARY STENT INTERVENTION;  Surgeon: Arty Binning, MD;  Location: [facility];  Service: Cardiovascular;  Laterality: N/A;   CYSTOSCOPY WITH URETHRAL DILATATION  03/12/2017   Procedure: CYSTOSCOPY WITH URETHRAL DILATATION;  Surgeon: Liliane Rei, MD;  Location: [facility];  Service: Orthopedics;;  Arden Kotyk, Resident Assisting   CYSTOSCOPY WITH URETHRAL DILATATION N/A 10/03/2018   Procedure: CYSTOSCOPY WITH URETHRAL BALLOON DILATATION WITH BILATERAL RETROGRADE PYELOGRAPHY;  Surgeon: Florencio Hunting, MD;  Location: [facility];  Service: Urology;  Laterality: N/A;   CYSTOSCOPY WITH URETHRAL DILATATION N/A 12/30/2020   Procedure: CYSTOSCOPY WITH BALLOON DILATATION OF URETHRAL STRICTURE;  Surgeon: Florencio Hunting, MD;  Location: [facility];  Service: Urology;  Laterality: N/A;   LAPAROSCOPIC INGUINAL HERNIA REPAIR Bilateral 01-10-2004   W/ MESH   LEFT HEART CATH AND CORONARY ANGIOGRAPHY N/A 02/13/2019   Procedure: LEFT HEART CATH AND CORONARY ANGIOGRAPHY;  Surgeon:  Arty Binning, MD;  Location: [facility];  Service: Cardiovascular;  Laterality: N/A;   neck benign removed from neck  yrs ago   PROSTATE BIOPSY N/A 07/12/2012   Procedure: PROSTATE BIOPSY AND ULTRASOUND;  Surgeon: Edmund Gouge, MD;  Location: [facility];  Service: Urology;  Laterality: N/A;   PROSTATE SURGERY  2002   tuna   REMOVAL LEFT NECK LYMPH NODE  1996   TONSILLECTOMY  CHILD   TOTAL KNEE ARTHROPLASTY Right 03/12/2017   Procedure: RIGHT TOTAL KNEE ARTHROPLASTY;  Surgeon: Liliane Rei, MD;  Location: [facility];  Service: Orthopedics;  Laterality: Right;  Adductor Block   TRANSURETHRAL RESECTION OF PROSTATE N/A 08/08/2013   Procedure: TRANSURETHRAL  RESECTION OF THE PROSTATE WITH GYRUS INSTRUMENTS;  Surgeon: Edmund Gouge, MD;  Location: [facility];  Service: Urology;  Laterality: N/A;   TRANSURETHRAL RESECTION OF PROSTATE     Patient Active Problem List   Diagnosis Date Noted   Immunization counseling 12/20/2022   Cellulitis 12/20/2022   Medication management 12/20/2022   Adult attention deficit disorder 06/04/2020   Chronic lymphocytic leukemia (HCC) 06/04/2020   Conductive hearing loss, bilateral 06/04/2020   Crohn's ileitis (HCC) 06/04/2020   Disorder of musculoskeletal system 06/04/2020   Erectile dysfunction 06/04/2020   Gastroesophageal reflux disease 06/04/2020   Gout 06/04/2020   History of adenomatous polyp of colon 06/04/2020   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   Hypothyroidism 06/04/2020   Increased frequency of urination 06/04/2020   Male hypogonadism 06/04/2020   Muscle pain 06/04/2020   Nocturia 06/04/2020   Obesity 06/04/2020   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   Osteopenia 06/04/2020   Peripheral neurogenic pain 06/04/2020   Recurrent falls 06/04/2020   Unspecified kyphosis, thoracic region 06/04/2020   Vitamin B12 deficiency 06/04/2020   Vitamin D deficiency 06/04/2020   Gait abnormality 05/17/2020   Ataxia 03/16/2020   Angina pectoris (HCC) 02/13/2019   Pseudophakia of both eyes 08/01/2018   History of total knee arthroplasty 03/29/2017   Stiffness of right knee 03/16/2017   OA (osteoarthritis) of knee 03/12/2017   Tremor, essential 12/03/2015   Essential hypertension 12/09/2014   CAD (coronary artery disease) 12/08/2013   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   Pure hypercholesterolemia 12/08/2013   Benign prostatic hyperplasia 08/08/2013   Essential and other specified forms of tremor 11/25/2012    PCP: Benedetta Bradley, MD  REFERRING PROVIDER: Benedetta Bradley, MD  REFERRING DIAG: R26.89  Balance disorder              R26.81  Gait  Instability    M62.81 lower extremity weakness  THERAPY DIAG:  Other abnormalities of gait and mobility  Other lack of coordination  Muscle weakness (generalized)  Rationale for Evaluation and Treatment: Rehabilitation  ONSET DATE: PT order 03/20/2023  SUBJECTIVE:   SUBJECTIVE STATEMENT: Pt states he thought his appt was at 10:45.   Pt states he is better though still has limitations.  Pt reports some improvement in confidence (25%) though still lacks confidence with daily mobility.  He reports 25% improvement with his gait and community ambulation.     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   PRECAUTIONS: Fall and Other: R TKA, Hx of CLL  WEIGHT BEARING RESTRICTIONS: No  FALLS:  Has patient fallen in last 6 months? Yes. Number of falls 1 when slipping on ice.  3 now.  06/28/23  LIVING ENVIRONMENT: Lives with: lives with their spouse Lives in: 2 story home with rails Stairs: yes Has following equipment at home:  cane, FWW  OCCUPATION: IT trainer, still works some  PLOF: Independent  PATIENT GOALS:  improve strength, feel more comfortable with walking, not feel that he is going to fall  OBJECTIVE:  Note: Objective measures were completed at Evaluation unless otherwise noted.  DIAGNOSTIC FINDINGS: N/A                                                                                                                               TREATMENT DATE:    5/16 5x STS test:  Initial:  16.4 sec without Ue's. Pt did use legs on the back of the chair.  Current:  9.69 without Ue's.    LE strength: Hip flexion:  R:  4+/5, L:  5/5  Hip abduction: R:  32.8, L:  38.1  TUG:  Initial/prior/current:  13.73 with cane / 10.14 / 11.39 without  AD  ABC scale:  Initial/Prior/Current: 75% / 75% / 37% indicated pt being at low level of physical conditioning and at risk for falls      PATIENT EDUCATION:  Education details: relevant anatomy, objective findings, progress, goals, HEP, POC, dx, and rationale of interventions. Person educated: Patient Education method: Explanation, demonstration, verbal and tactile cues Education comprehension: verbalized understanding, returned demonstration, verbal and tactile cues required  HOME EXERCISE PROGRAM: Access Code: [email or web address] URL: [email or web address] Date: 04/23/2023 Prepared by: Marnie Siren  Updated HEP: - HALF TANDEM STANCE  - 1 x daily - 5-7 x weekly - 2 reps - 20-30 seconds hold - Side Stepping with Counter Support  - 1 x daily - 6-7 x weekly - 2-3 reps   ASSESSMENT:  CLINICAL IMPRESSION: Pt arrived very late to his appt due to thinking his appt was at 10:45 instead of 10:15.  Pt is improving with balance and functional mobility though continues to have deficits.  He is responding well to land based and aquatic therapy.  He reports a 25% improvement in stability with gait and community ambulation and also a 25% confidence with daily mobility.   He gives great effort with all exercises and activities.  Pt does require assistance with exercises for balance.  Pt has difficulty with step ups.  Pt had no change in R hip flexion strength though demonstrate improved hip abduction strength.  Pt demonstrates a good improvement in 5x STS test with a nearly 7 second improvement.  He also has improved control with sit/stands.  Pt had worsened self perceived disability with ABC scale score decreasing from 75% prior to 37% currently.  Pt met STG's #1-3 and LTG #1 and partially met LTG's #2,5.  Pt should benefit from cont skilled PT to improve balance, strength,  stability, and to maximize functional mobility.     OBJECTIVE IMPAIRMENTS: Abnormal gait, decreased activity tolerance,  decreased balance, decreased coordination, decreased endurance, decreased mobility, difficulty walking, decreased strength, and postural dysfunction.   ACTIVITY LIMITATIONS: carrying, lifting, standing, squatting, and locomotion level  PARTICIPATION LIMITATIONS: shopping and community activity  PERSONAL FACTORS: Time since onset of injury/illness/exacerbation and 3+ comorbidities: FXAS, R TKA, peripheral neuropathy, CLL are also affecting patient's functional outcome.   REHAB POTENTIAL: Good  CLINICAL DECISION MAKING: Evolving/moderate complexity  EVALUATION COMPLEXITY: Moderate   GOALS:  SHORT TERM GOALS: Target date: 05/11/2023  Pt will be independent with HEP for improved balance, strength, and functional mobility. Baseline: Goal status: GOAL MET  07/20/23  2.  Pt will demo improved stability with turning with gait.  Baseline:  Goal status: GOAL MET  07/20/23  3.  Pt will perform TUG safely including turning and and performing stand to sit transfer with good control and without assistance.  Baseline:  Goal status: GOAL MET  07/20/23  4.  Pt will be able to perform tandem stance for at least 20 seconds for improved balance and stability with daily activities.  Baseline:  Goal status: in progress 05/24/23 Target date:  05/18/2023   LONG TERM GOALS: Target date: 08/31/2023   Pt will demo improved time on 5x STS test by 3 seconds and also improved form by not using his legs on the back of the chair for improved functional LE strength and performance of transfers.  Baseline:  Goal status: GOAL MET  5/16  2.  Pt will demo improved R hip flexion strength to 5/5 MMT and bilat hip abd strength by at least 6-8 lbs for improved strength to perform functional mobility with increased ease and less difficulty.  Baseline:  Goal status: 66% MET   3.  (Will set Berg goal next visit after testing. )Pt will improve on Berg balance test to >/= 36/56 to demonstrate a decrease in fall  risk. Baseline: 26/56 06/05/23 Goal status: in progress 06/05/23  4.  Pt will report at least a 70% improvement in stability with gait and community ambulation.  Baseline:  Goal status: PROGRESSING THOUGH NOT MET  07/20/23  5.  Pt will report improved confidence with his daily functional mobility.  Baseline:  Goal status: PARTIALLY MET  5/16     PLAN:  PT FREQUENCY: 2x/week  PT DURATION:  4-6 weeks  PLANNED INTERVENTIONS: 97164- PT Re-evaluation, 97110-Therapeutic exercises, 97530- Therapeutic activity, 97112- Neuromuscular re-education, 97535- Self Care, 16109- Manual therapy, [phone]- Gait training, [phone]- Aquatic Therapy, Patient/Family education, Balance training, Stair training, and Taping  PLAN FOR NEXT SESSION: Cont with Balance and Gait training and strengthening.  Cont with aquatic and land based PT.   Trina Fujita III PT, DPT 07/20/23 4:25 PM  [facility] [address] [place], [place], [address] Phone: [phone]   Fax:  [phone]

## 2023-07-20 NOTE — Progress Notes (Signed)
 Patient was seen on 07/20/2023 for the Core Session 12 of Diabetes Prevention Program course at [facility]. By the end of this session patients are able to complete the following objectives:   Learning Objectives: Describe their current progress toward defined goals. Describe common causes for slipping from healthy eating or being active. Explain what to do to get back on their feet after a slip.  Goals:  Record weight taken outside of class.  Track foods and beverages eaten each day in the "Food and Activity Tracker," including calories and fat grams for each item.   Track activity type, minutes active, and distance reached each day in the "Food and Activity Tracker."  Try out the two action plans created during session- "Slips from Healthy Eating: Action Plan" and "Slips from Being Active: Action Plan" Answer questions on the handout.   Follow-Up Plan: Attend Core Session 13 next week.  Bring completed "Food and Activity Tracker" next week to be reviewed by Lifestyle Coach.

## 2023-07-26 DIAGNOSIS — G5601 Carpal tunnel syndrome, right upper limb: Secondary | ICD-10-CM | POA: Diagnosis not present

## 2023-08-01 DIAGNOSIS — G5601 Carpal tunnel syndrome, right upper limb: Secondary | ICD-10-CM | POA: Diagnosis not present

## 2023-08-03 ENCOUNTER — Encounter (HOSPITAL_BASED_OUTPATIENT_CLINIC_OR_DEPARTMENT_OTHER): Payer: Self-pay | Admitting: Physical Therapy

## 2023-08-03 ENCOUNTER — Ambulatory Visit (HOSPITAL_BASED_OUTPATIENT_CLINIC_OR_DEPARTMENT_OTHER): Payer: Self-pay | Admitting: Physical Therapy

## 2023-08-03 DIAGNOSIS — R293 Abnormal posture: Secondary | ICD-10-CM | POA: Diagnosis not present

## 2023-08-03 DIAGNOSIS — R278 Other lack of coordination: Secondary | ICD-10-CM

## 2023-08-03 DIAGNOSIS — M6281 Muscle weakness (generalized): Secondary | ICD-10-CM | POA: Diagnosis not present

## 2023-08-03 DIAGNOSIS — R2689 Other abnormalities of gait and mobility: Secondary | ICD-10-CM | POA: Diagnosis not present

## 2023-08-03 NOTE — Therapy (Signed)
 OUTPATIENT PHYSICAL THERAPY LOWER EXTREMITY TREATMENT      Patient Name: Cole Martinez MRN: [id number] DOB:October 29, 1945, 78 y.o., male Today's Date: 08/03/2023  END OF SESSION:  PT End of Session - 08/03/23 0804     Visit Number 21    Number of Visits 28    Date for PT Re-Evaluation 08/31/23    Authorization Type MCR A and B    PT Start Time 0802    PT Stop Time 0845    PT Time Calculation (min) 43 min    Activity Tolerance Patient tolerated treatment well    Behavior During Therapy WFL for tasks assessed/performed                   Past Medical History:  Diagnosis Date   ADHD (attention deficit hyperactivity disorder)    B12 deficiency    BPH (benign prostatic hypertrophy)    Chronic lymphocytic leukemia (CLL), T-cell (HCC) DX 1996--  ONCOLOGIST-  DR Scherrie Curt   PT IS ASYMPTOMATIC--- LAST CBC W/ DIFF 06-24-2012 STABLE   Coronary artery disease CARDIOLOGIST- DR Kay Parson   S/P STENTING LAD 1999 // Myoview  01/2019: EF 62, normal perfusion; Low Risk   Crohn's disease of ileum (HCC) SINCE 1988   ED (erectile dysfunction)    Elevated PSA    Essential and other specified forms of tremor 11/25/2012   Gait abnormality 05/17/2020   H/O adenomatous polyp of colon    Hyperlipidemia    Hypertension    Nocturia    OA (osteoarthritis)    Peripheral neuropathy    hx of, none current as of 08-04-13   Peyronie disease    S/P coronary artery stent placement OCT 1999 OF LAD   Tremor, hereditary, benign MILD RIGHT HAND   Past Surgical History:  Procedure Laterality Date   CARDIOVASCULAR STRESS TEST  11-01-2010 DR Kay Parson   NORMAL PERFUSION STUDY/ EF 64%/ NO ISCHEMIA   cataract surgery  Bilateral feburary 2020   with lens placement    COLONOSCOPY WITH PROPOFOL  N/A 09/24/2012   Procedure: COLONOSCOPY WITH PROPOFOL ;  Surgeon: Garrett Kallman, MD;  Location: [facility];  Service: Endoscopy;  Laterality: N/A;   CORONARY ANGIOPLASTY WITH STENT PLACEMENT  OCT 1999    STENT OF LAD   CORONARY STENT INTERVENTION N/A 02/14/2019   Procedure: CORONARY STENT INTERVENTION;  Surgeon: Arty Binning, MD;  Location: [facility];  Service: Cardiovascular;  Laterality: N/A;   CYSTOSCOPY WITH URETHRAL DILATATION  03/12/2017   Procedure: CYSTOSCOPY WITH URETHRAL DILATATION;  Surgeon: Liliane Rei, MD;  Location: [facility];  Service: Orthopedics;;  Arden Kotyk, Resident Assisting   CYSTOSCOPY WITH URETHRAL DILATATION N/A 10/03/2018   Procedure: CYSTOSCOPY WITH URETHRAL BALLOON DILATATION WITH BILATERAL RETROGRADE PYELOGRAPHY;  Surgeon: Florencio Hunting, MD;  Location: [facility];  Service: Urology;  Laterality: N/A;   CYSTOSCOPY WITH URETHRAL DILATATION N/A 12/30/2020   Procedure: CYSTOSCOPY WITH BALLOON DILATATION OF URETHRAL STRICTURE;  Surgeon: Florencio Hunting, MD;  Location: [facility];  Service: Urology;  Laterality: N/A;   LAPAROSCOPIC INGUINAL HERNIA REPAIR Bilateral 01-10-2004   W/ MESH   LEFT HEART CATH AND CORONARY ANGIOGRAPHY N/A 02/13/2019   Procedure: LEFT HEART CATH AND CORONARY ANGIOGRAPHY;  Surgeon: Arty Binning, MD;  Location: [facility];  Service: Cardiovascular;  Laterality: N/A;   neck benign removed from neck  yrs ago   PROSTATE BIOPSY N/A 07/12/2012   Procedure: PROSTATE BIOPSY AND ULTRASOUND;  Surgeon: Edmund Gouge, MD;  Location:  [facility];  Service: Urology;  Laterality: N/A;   PROSTATE SURGERY  2002   tuna   REMOVAL LEFT NECK LYMPH NODE  1996   TONSILLECTOMY  CHILD   TOTAL KNEE ARTHROPLASTY Right 03/12/2017   Procedure: RIGHT TOTAL KNEE ARTHROPLASTY;  Surgeon: Liliane Rei, MD;  Location: [facility];  Service: Orthopedics;  Laterality: Right;  Adductor Block   TRANSURETHRAL RESECTION OF PROSTATE N/A 08/08/2013   Procedure: TRANSURETHRAL RESECTION OF THE PROSTATE WITH GYRUS INSTRUMENTS;  Surgeon: Edmund Gouge, MD;  Location: [facility];  Service: Urology;  Laterality: N/A;   TRANSURETHRAL RESECTION OF PROSTATE     Patient  Active Problem List   Diagnosis Date Noted   Immunization counseling 12/20/2022   Cellulitis 12/20/2022   Medication management 12/20/2022   Adult attention deficit disorder 06/04/2020   Chronic lymphocytic leukemia (HCC) 06/04/2020   Conductive hearing loss, bilateral 06/04/2020   Crohn's ileitis (HCC) 06/04/2020   Disorder of musculoskeletal system 06/04/2020   Erectile dysfunction 06/04/2020   Gastroesophageal reflux disease 06/04/2020   Gout 06/04/2020   History of adenomatous polyp of colon 06/04/2020   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   Hypothyroidism 06/04/2020   Increased frequency of urination 06/04/2020   Male hypogonadism 06/04/2020   Muscle pain 06/04/2020   Nocturia 06/04/2020   Obesity 06/04/2020   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   Osteopenia 06/04/2020   Peripheral neurogenic pain 06/04/2020   Recurrent falls 06/04/2020   Unspecified kyphosis, thoracic region 06/04/2020   Vitamin B12 deficiency 06/04/2020   Vitamin D deficiency 06/04/2020   Gait abnormality 05/17/2020   Ataxia 03/16/2020   Angina pectoris (HCC) 02/13/2019   Pseudophakia of both eyes 08/01/2018   History of total knee arthroplasty 03/29/2017   Stiffness of right knee 03/16/2017   OA (osteoarthritis) of knee 03/12/2017   Tremor, essential 12/03/2015   Essential hypertension 12/09/2014   CAD (coronary artery disease) 12/08/2013   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   Pure hypercholesterolemia 12/08/2013   Benign prostatic hyperplasia 08/08/2013   Essential and other specified forms of tremor 11/25/2012    PCP: Benedetta Bradley, MD  REFERRING PROVIDER: Benedetta Bradley, MD  REFERRING DIAG: R26.89  Balance disorder              R26.81  Gait Instability    M62.81 lower extremity weakness  THERAPY DIAG:  Other abnormalities of gait and mobility  Muscle weakness (generalized)  Other lack of coordination  Abnormal posture  Rationale for  Evaluation and Treatment: Rehabilitation  ONSET DATE: PT order 03/20/2023  SUBJECTIVE:   SUBJECTIVE STATEMENT: Pt states no recent falls.  Has been compliant with HEP. 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   PRECAUTIONS: Fall and Other: R TKA, Hx of CLL   WEIGHT BEARING RESTRICTIONS: No  FALLS:  Has patient fallen in last 6 months? Yes. Number of falls 1 when slipping on ice.  3 now.  06/28/23  LIVING ENVIRONMENT: Lives with: lives with their spouse Lives in: 2 story home with rails Stairs: yes Has following equipment at home:  cane, FWW  OCCUPATION: IT trainer, still works some  PLOF: Independent  PATIENT GOALS:  improve strength, feel more comfortable with walking,  not feel that he is going to fall  OBJECTIVE:  Note: Objective measures were completed at Evaluation unless otherwise noted.  DIAGNOSTIC FINDINGS: N/A                                                                                                                               TREATMENT DATE:    DATE: 08/03/23 Pt seen for aquatic therapy today.  Treatment took place in water  3.5-4.75 ft in depth at the [facility] pool. Temp of water  was 91.  Pt entered/exited the pool via stairs with hand rail.   *walking forward, back and side stepping ue support yellow HB *Side stepping "stepping over a box" *tandem stance leading R/L UE add/abd using RBHB in 3.5ft(good challenge)  *scapular retraction using riderband 2 x 12 elbows straight then bent *Solid noodle press for TrA engagement wide stance then staggered x 10.  VC and demonstration for proper execution  - Oblique set in position above R/L x 5 holding x 5-10s *noodle stomp solid noodle.   Manual assist to gain position, vc and demonstration for execution.  Bilat ue support on wall   Pt requires the buoyancy and hydrostatic pressure of water  for support, and to offload joints by unweighting joint load by at least 50 % in navel deep water  and by at least 75-80% in chest to neck deep water .  Viscosity of the water  is needed for resistance of strengthening. Water  current perturbations provides challenge to standing balance requiring increased core activation.   5/16 5x STS test:  Initial:  16.4 sec without Ue's. Pt did use legs on the back of the chair.  Current:  9.69 without Ue's.    LE strength: Hip flexion:  R:  4+/5, L:  5/5  Hip abduction: R:  32.8, L:  38.1  TUG:  Initial/prior/current:  13.73 with cane / 10.14 / 11.39 without AD  ABC scale:  Initial/Prior/Current: 75% / 75% / 37% indicated pt being at low level of physical conditioning and at risk for falls      PATIENT EDUCATION:  Education details: relevant anatomy, objective findings, progress, goals, HEP, POC, dx, and rationale of interventions. Person educated: Patient Education method: Explanation, demonstration, verbal and tactile cues Education comprehension: verbalized understanding, returned demonstration, verbal and tactile cues required  HOME EXERCISE PROGRAM: Access Code: [email or web address] URL: [email or web address] Date: 04/23/2023 Prepared by: Marnie Siren  Updated HEP: - HALF TANDEM STANCE  - 1 x daily - 5-7 x weekly - 2 reps - 20-30 seconds hold - Side Stepping with Counter Support  - 1 x daily - 6-7 x weekly - 2-3 reps   ASSESSMENT:  CLINICAL IMPRESSION: Improving balance today as pt tolerates balance challenges in reduced submersion (tandem and FT) by 4 inches (3.6 ft).  He does require 4 ft submersion for approp SLS challenge.  Requested pt increased frequency of aquatic sessions as safest environment to focus on balance.  He reports compliance with  land based HEP he completes  when at his gym which he regularly attends. Goals ongoing  PN: Pt is improving with balance and functional mobility though continues to have deficits.  He is responding well to land based and aquatic therapy.  He reports a 25% improvement in stability with gait and community ambulation and also a 25% confidence with daily mobility.   He gives great effort with all exercises and activities.  Pt does require assistance with exercises for balance.  Pt has difficulty with step ups.  Pt had no change in R hip flexion strength though demonstrate improved hip abduction strength.  Pt demonstrates a good improvement in 5x STS test with a nearly 7 second improvement.  He also has improved control with sit/stands.  Pt had worsened self perceived disability with ABC scale score decreasing from 75% prior to 37% currently.  Pt met STG's #1-3 and LTG #1 and partially met LTG's #2,5.  Pt should benefit from cont skilled PT to improve balance, strength, stability, and to maximize functional mobility.     OBJECTIVE IMPAIRMENTS: Abnormal gait, decreased activity tolerance, decreased balance, decreased coordination, decreased endurance, decreased mobility, difficulty walking, decreased strength, and postural dysfunction.   ACTIVITY LIMITATIONS: carrying, lifting, standing, squatting, and locomotion level  PARTICIPATION LIMITATIONS: shopping and community activity  PERSONAL FACTORS: Time since onset of injury/illness/exacerbation and 3+ comorbidities: FXAS, R TKA, peripheral neuropathy, CLL are also affecting patient's functional outcome.   REHAB POTENTIAL: Good  CLINICAL DECISION MAKING: Evolving/moderate complexity  EVALUATION COMPLEXITY: Moderate   GOALS:  SHORT TERM GOALS: Target date: 05/11/2023  Pt will be independent with HEP for improved balance, strength, and functional mobility. Baseline: Goal status: GOAL MET  07/20/23  2.  Pt will demo improved stability with turning with gait.  Baseline:  Goal  status: GOAL MET  07/20/23  3.  Pt will perform TUG safely including turning and and performing stand to sit transfer with good control and without assistance.  Baseline:  Goal status: GOAL MET  07/20/23  4.  Pt will be able to perform tandem stance for at least 20 seconds for improved balance and stability with daily activities.  Baseline:  Goal status: in progress 05/24/23 Target date:  05/18/2023   LONG TERM GOALS: Target date: 08/31/2023   Pt will demo improved time on 5x STS test by 3 seconds and also improved form by not using his legs on the back of the chair for improved functional LE strength and performance of transfers.  Baseline:  Goal status: GOAL MET  5/16  2.  Pt will demo improved R hip flexion strength to 5/5 MMT and bilat hip abd strength by at least 6-8 lbs for improved strength to perform functional mobility with increased ease and less difficulty.  Baseline:  Goal status: 66% MET   3.  (Will set Berg goal next visit after testing. )Pt will improve on Berg balance test to >/= 36/56 to demonstrate a decrease in fall risk. Baseline: 26/56 06/05/23 Goal status: in progress 06/05/23  4.  Pt will report at least a 70% improvement in stability with gait and community ambulation.  Baseline:  Goal status: PROGRESSING THOUGH NOT MET  07/20/23  5.  Pt will report improved confidence with his daily functional mobility.  Baseline:  Goal status: PARTIALLY MET  5/16     PLAN:  PT FREQUENCY: 2x/week  PT DURATION:  4-6 weeks  PLANNED INTERVENTIONS: 97164- PT Re-evaluation, 97110-Therapeutic exercises, 97530- Therapeutic activity, [id number]- Neuromuscular re-education, 97535- Self  Care, 16109- Manual therapy, [phone]- Gait training, [id number]- Aquatic Therapy, Patient/Family education, Balance training, Stair training, and Taping  PLAN FOR NEXT SESSION: Cont with Balance and Gait training and strengthening.  Cont with aquatic and land based PT.   Trina Fujita III PT, DPT 08/03/23  8:19 AM  [facility] [address] [place], [place], [address] Phone: [phone]   Fax:  [phone]

## 2023-08-04 DIAGNOSIS — E669 Obesity, unspecified: Secondary | ICD-10-CM | POA: Diagnosis not present

## 2023-08-04 DIAGNOSIS — E7800 Pure hypercholesterolemia, unspecified: Secondary | ICD-10-CM | POA: Diagnosis not present

## 2023-08-04 DIAGNOSIS — I2510 Atherosclerotic heart disease of native coronary artery without angina pectoris: Secondary | ICD-10-CM | POA: Diagnosis not present

## 2023-08-08 ENCOUNTER — Ambulatory Visit (HOSPITAL_BASED_OUTPATIENT_CLINIC_OR_DEPARTMENT_OTHER): Attending: Internal Medicine | Admitting: Physical Therapy

## 2023-08-08 ENCOUNTER — Encounter (HOSPITAL_BASED_OUTPATIENT_CLINIC_OR_DEPARTMENT_OTHER): Payer: Self-pay | Admitting: Physical Therapy

## 2023-08-08 DIAGNOSIS — M6281 Muscle weakness (generalized): Secondary | ICD-10-CM | POA: Insufficient documentation

## 2023-08-08 DIAGNOSIS — R2689 Other abnormalities of gait and mobility: Secondary | ICD-10-CM | POA: Insufficient documentation

## 2023-08-08 DIAGNOSIS — R278 Other lack of coordination: Secondary | ICD-10-CM | POA: Insufficient documentation

## 2023-08-08 NOTE — Therapy (Signed)
 OUTPATIENT PHYSICAL THERAPY LOWER EXTREMITY TREATMENT      Patient Name: Cole Martinez MRN: [id number] DOB:Feb 27, 1946, 78 y.o., male Today's Date: 08/08/2023  END OF SESSION:  PT End of Session - 08/08/23 0933     Visit Number 22    Number of Visits 28    Date for PT Re-Evaluation 08/31/23    Authorization Type MCR A and B    PT Start Time 0933    PT Stop Time 1014    PT Time Calculation (min) 41 min    Activity Tolerance Patient tolerated treatment well    Behavior During Therapy WFL for tasks assessed/performed                   Past Medical History:  Diagnosis Date   ADHD (attention deficit hyperactivity disorder)    B12 deficiency    BPH (benign prostatic hypertrophy)    Chronic lymphocytic leukemia (CLL), T-cell (HCC) DX 1996--  ONCOLOGIST-  DR Scherrie Curt   PT IS ASYMPTOMATIC--- LAST CBC W/ DIFF 06-24-2012 STABLE   Coronary artery disease CARDIOLOGIST- DR Kay Parson   S/P STENTING LAD 1999 // Myoview  01/2019: EF 62, normal perfusion; Low Risk   Crohn's disease of ileum (HCC) SINCE 1988   ED (erectile dysfunction)    Elevated PSA    Essential and other specified forms of tremor 11/25/2012   Gait abnormality 05/17/2020   H/O adenomatous polyp of colon    Hyperlipidemia    Hypertension    Nocturia    OA (osteoarthritis)    Peripheral neuropathy    hx of, none current as of 08-04-13   Peyronie disease    S/P coronary artery stent placement OCT 1999 OF LAD   Tremor, hereditary, benign MILD RIGHT HAND   Past Surgical History:  Procedure Laterality Date   CARDIOVASCULAR STRESS TEST  11-01-2010 DR Kay Parson   NORMAL PERFUSION STUDY/ EF 64%/ NO ISCHEMIA   cataract surgery  Bilateral feburary 2020   with lens placement    COLONOSCOPY WITH PROPOFOL  N/A 09/24/2012   Procedure: COLONOSCOPY WITH PROPOFOL ;  Surgeon: Garrett Kallman, MD;  Location: [facility];  Service: Endoscopy;  Laterality: N/A;   CORONARY ANGIOPLASTY WITH STENT PLACEMENT  OCT 1999    STENT OF LAD   CORONARY STENT INTERVENTION N/A 02/14/2019   Procedure: CORONARY STENT INTERVENTION;  Surgeon: Arty Binning, MD;  Location: [facility];  Service: Cardiovascular;  Laterality: N/A;   CYSTOSCOPY WITH URETHRAL DILATATION  03/12/2017   Procedure: CYSTOSCOPY WITH URETHRAL DILATATION;  Surgeon: Liliane Rei, MD;  Location: [facility];  Service: Orthopedics;;  Arden Kotyk, Resident Assisting   CYSTOSCOPY WITH URETHRAL DILATATION N/A 10/03/2018   Procedure: CYSTOSCOPY WITH URETHRAL BALLOON DILATATION WITH BILATERAL RETROGRADE PYELOGRAPHY;  Surgeon: Florencio Hunting, MD;  Location: [facility];  Service: Urology;  Laterality: N/A;   CYSTOSCOPY WITH URETHRAL DILATATION N/A 12/30/2020   Procedure: CYSTOSCOPY WITH BALLOON DILATATION OF URETHRAL STRICTURE;  Surgeon: Florencio Hunting, MD;  Location: [facility];  Service: Urology;  Laterality: N/A;   LAPAROSCOPIC INGUINAL HERNIA REPAIR Bilateral 01-10-2004   W/ MESH   LEFT HEART CATH AND CORONARY ANGIOGRAPHY N/A 02/13/2019   Procedure: LEFT HEART CATH AND CORONARY ANGIOGRAPHY;  Surgeon: Arty Binning, MD;  Location: [facility];  Service: Cardiovascular;  Laterality: N/A;   neck benign removed from neck  yrs ago   PROSTATE BIOPSY N/A 07/12/2012   Procedure: PROSTATE BIOPSY AND ULTRASOUND;  Surgeon: Edmund Gouge, MD;  Location:  [facility];  Service: Urology;  Laterality: N/A;   PROSTATE SURGERY  2002   tuna   REMOVAL LEFT NECK LYMPH NODE  1996   TONSILLECTOMY  CHILD   TOTAL KNEE ARTHROPLASTY Right 03/12/2017   Procedure: RIGHT TOTAL KNEE ARTHROPLASTY;  Surgeon: Liliane Rei, MD;  Location: [facility];  Service: Orthopedics;  Laterality: Right;  Adductor Block   TRANSURETHRAL RESECTION OF PROSTATE N/A 08/08/2013   Procedure: TRANSURETHRAL RESECTION OF THE PROSTATE WITH GYRUS INSTRUMENTS;  Surgeon: Edmund Gouge, MD;  Location: [facility];  Service: Urology;  Laterality: N/A;   TRANSURETHRAL RESECTION OF PROSTATE     Patient  Active Problem List   Diagnosis Date Noted   Immunization counseling 12/20/2022   Cellulitis 12/20/2022   Medication management 12/20/2022   Adult attention deficit disorder 06/04/2020   Chronic lymphocytic leukemia (HCC) 06/04/2020   Conductive hearing loss, bilateral 06/04/2020   Crohn's ileitis (HCC) 06/04/2020   Disorder of musculoskeletal system 06/04/2020   Erectile dysfunction 06/04/2020   Gastroesophageal reflux disease 06/04/2020   Gout 06/04/2020   History of adenomatous polyp of colon 06/04/2020   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   Hypothyroidism 06/04/2020   Increased frequency of urination 06/04/2020   Male hypogonadism 06/04/2020   Muscle pain 06/04/2020   Nocturia 06/04/2020   Obesity 06/04/2020   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   Osteopenia 06/04/2020   Peripheral neurogenic pain 06/04/2020   Recurrent falls 06/04/2020   Unspecified kyphosis, thoracic region 06/04/2020   Vitamin B12 deficiency 06/04/2020   Vitamin D deficiency 06/04/2020   Gait abnormality 05/17/2020   Ataxia 03/16/2020   Angina pectoris (HCC) 02/13/2019   Pseudophakia of both eyes 08/01/2018   History of total knee arthroplasty 03/29/2017   Stiffness of right knee 03/16/2017   OA (osteoarthritis) of knee 03/12/2017   Tremor, essential 12/03/2015   Essential hypertension 12/09/2014   CAD (coronary artery disease) 12/08/2013   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   Pure hypercholesterolemia 12/08/2013   Benign prostatic hyperplasia 08/08/2013   Essential and other specified forms of tremor 11/25/2012    PCP: Benedetta Bradley, MD  REFERRING PROVIDER: Benedetta Bradley, MD  REFERRING DIAG: R26.89  Balance disorder              R26.81  Gait Instability    M62.81 lower extremity weakness  THERAPY DIAG:  Other abnormalities of gait and mobility  Muscle weakness (generalized)  Other lack of coordination  Rationale for Evaluation and  Treatment: Rehabilitation  ONSET DATE: PT order 03/20/2023  SUBJECTIVE:   SUBJECTIVE STATEMENT: Pt states no falls.  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   PRECAUTIONS: Fall and Other: R TKA, Hx of CLL   WEIGHT BEARING RESTRICTIONS: No  FALLS:  Has patient fallen in last 6 months? Yes. Number of falls 1 when slipping on ice.  3 now.  06/28/23  LIVING ENVIRONMENT: Lives with: lives with their spouse Lives in: 2 story home with rails Stairs: yes Has following equipment at home:  cane, FWW  OCCUPATION: IT trainer, still works some  PLOF: Independent  PATIENT GOALS:  improve strength, feel more comfortable with walking, not feel that he is going to fall  OBJECTIVE:  Note: Objective measures were completed at Evaluation unless otherwise noted.  DIAGNOSTIC FINDINGS: N/A                                                                                                                               TREATMENT DATE:    DATE: 08/08/23 Pt seen for aquatic therapy today.  Treatment took place in water  3.5-4.75 ft in depth at the [facility] pool. Temp of water  was 91.  Pt entered/exited the pool via stairs with hand rail.   *walking forward, back and side stepping ue support yellow HB *tree pose in 3.7 ft ue support RBHB: then wall releasing when balanced.  Best hold with SLS Left 8s *Side stepping "stepping over a box" Staggered stance with ue horizontal add/abd *[place] [employer] [place] *Standing arm swing with green bells 5 sow/5 fast x 3 sets wide stance then staggered stances *tandem stance leading R/L UE add/abd using RBHB in 3.93ft(good challenge)  *Solid noodle press for TrA engagement wide stance then staggered x 10 in  3.54ft.  VC and demonstration for proper execution     Pt requires the buoyancy and hydrostatic pressure of water  for support, and to offload joints by unweighting joint load by at least 50 % in navel deep water  and by at least 75-80% in chest to neck deep water .  Viscosity of the water  is needed for resistance of strengthening. Water  current perturbations provides challenge to standing balance requiring increased core activation.   5/16 5x STS test:  Initial:  16.4 sec without Ue's. Pt did use legs on the back of the chair.  Current:  9.69 without Ue's.    LE strength: Hip flexion:  R:  4+/5, L:  5/5  Hip abduction: R:  32.8, L:  38.1  TUG:  Initial/prior/current:  13.73 with cane / 10.14 / 11.39 without AD  ABC scale:  Initial/Prior/Current: 75% / 75% / 37% indicated pt being at low level of physical conditioning and at risk for falls      PATIENT EDUCATION:  Education details: relevant anatomy, objective findings, progress, goals, HEP, POC, dx, and rationale of interventions. Person educated: Patient Education method: Explanation, demonstration, verbal and tactile cues Education comprehension: verbalized understanding, returned demonstration, verbal and tactile cues required  HOME EXERCISE PROGRAM: Access Code: [email or web address] URL: [email or web address] Date: 04/23/2023 Prepared by: Marnie Siren  Updated HEP: - HALF TANDEM STANCE  - 1 x daily - 5-7 x weekly - 2 reps - 20-30 seconds hold - Side Stepping with Counter Support  - 1 x daily - 6-7 x weekly - 2-3 reps   ASSESSMENT:  CLINICAL IMPRESSION: Pt with increased difficulty today in therapy as his tremors are a little more present.  Cues for encouragement for decreased frustration level. Progressed core strengthening and continued to focus on balance. Puts forth good effort. Goals ongoing    PN: Pt is improving with balance and functional mobility though continues to have deficits.  He is responding well to  land based and aquatic therapy.  He reports a 25% improvement in stability with gait and community ambulation and also a 25% confidence with daily mobility.   He gives great effort with all exercises and activities.  Pt does require assistance with exercises for balance.  Pt has difficulty with step ups.  Pt had no change in R hip flexion strength though demonstrate improved hip abduction strength.  Pt demonstrates a good improvement in 5x STS test with a nearly 7 second improvement.  He also has improved control with sit/stands.  Pt had worsened self perceived disability with ABC scale score decreasing from 75% prior to 37% currently.  Pt met STG's #1-3 and LTG #1 and partially met LTG's #2,5.  Pt should benefit from cont skilled PT to improve balance, strength, stability, and to maximize functional mobility.     OBJECTIVE IMPAIRMENTS: Abnormal gait, decreased activity tolerance, decreased balance, decreased coordination, decreased endurance, decreased mobility, difficulty walking, decreased strength, and postural dysfunction.   ACTIVITY LIMITATIONS: carrying, lifting, standing, squatting, and locomotion level  PARTICIPATION LIMITATIONS: shopping and community activity  PERSONAL FACTORS: Time since onset of injury/illness/exacerbation and 3+ comorbidities: FXAS, R TKA, peripheral neuropathy, CLL are also affecting patient's functional outcome.   REHAB POTENTIAL: Good  CLINICAL DECISION MAKING: Evolving/moderate complexity  EVALUATION COMPLEXITY: Moderate   GOALS:  SHORT TERM GOALS: Target date: 05/11/2023  Pt will be independent with HEP for improved balance, strength, and functional mobility. Baseline: Goal status: GOAL MET  07/20/23  2.  Pt will demo improved stability with turning with gait.  Baseline:  Goal status: GOAL MET  07/20/23  3.  Pt will perform TUG safely including turning and and performing stand to sit transfer with good control and without assistance.  Baseline:  Goal  status: GOAL MET  07/20/23  4.  Pt will be able to perform tandem stance for at least 20 seconds for improved balance and stability with daily activities.  Baseline:  Goal status: in progress 05/24/23 Target date:  05/18/2023   LONG TERM GOALS: Target date: 08/31/2023   Pt will demo improved time on 5x STS test by 3 seconds and also improved form by not using his legs on the back of the chair for improved functional LE strength and performance of transfers.  Baseline:  Goal status: GOAL MET  5/16  2.  Pt will demo improved R hip flexion strength to 5/5 MMT and bilat hip abd strength by at least 6-8 lbs for improved strength to perform functional mobility with increased ease and less difficulty.  Baseline:  Goal status: 66% MET   3.  (Will set Berg goal next visit after testing. )Pt will improve on Berg balance test to >/= 36/56 to demonstrate a decrease in fall risk. Baseline: 26/56 06/05/23 Goal status: in progress 06/05/23  4.  Pt will report at least a 70% improvement in stability with gait and community ambulation.  Baseline:  Goal status: PROGRESSING THOUGH NOT MET  07/20/23  5.  Pt will report improved confidence with his daily functional mobility.  Baseline:  Goal status: PARTIALLY MET  5/16     PLAN:  PT FREQUENCY: 2x/week  PT DURATION:  4-6 weeks  PLANNED INTERVENTIONS: 97164- PT Re-evaluation, 97110-Therapeutic exercises, 97530- Therapeutic activity, 97112- Neuromuscular re-education, 97535- Self Care, 16109- Manual therapy, [phone]- Gait training, [phone]- Aquatic Therapy, Patient/Family education, Balance training, Stair training, and Taping  PLAN FOR NEXT SESSION: Cont with Balance and Gait training and strengthening.  Cont with aquatic and land based PT.   Adriana Hopping [place]) Khyran Riera MPT 08/08/23 9:49 AM [facility] [address] [place], [place], [address] Phone: [phone]   Fax:  [phone]

## 2023-08-09 ENCOUNTER — Ambulatory Visit (HOSPITAL_BASED_OUTPATIENT_CLINIC_OR_DEPARTMENT_OTHER): Admitting: Physical Therapy

## 2023-08-09 DIAGNOSIS — R2689 Other abnormalities of gait and mobility: Secondary | ICD-10-CM | POA: Diagnosis not present

## 2023-08-09 DIAGNOSIS — M6281 Muscle weakness (generalized): Secondary | ICD-10-CM | POA: Diagnosis not present

## 2023-08-09 DIAGNOSIS — R278 Other lack of coordination: Secondary | ICD-10-CM | POA: Diagnosis not present

## 2023-08-09 NOTE — Therapy (Signed)
 OUTPATIENT PHYSICAL THERAPY LOWER EXTREMITY TREATMENT      Patient Name: Cole Martinez MRN: [id number] DOB:01-Feb-1946, 78 y.o., male Today's Date: 08/10/2023  END OF SESSION:  PT End of Session - 08/09/23 1324     Visit Number 23    Number of Visits 28    Date for PT Re-Evaluation 08/31/23    Authorization Type MCR A and B    PT Start Time 1321    PT Stop Time 1401    PT Time Calculation (min) 40 min    Activity Tolerance Patient tolerated treatment well    Behavior During Therapy WFL for tasks assessed/performed                   Past Medical History:  Diagnosis Date   ADHD (attention deficit hyperactivity disorder)    B12 deficiency    BPH (benign prostatic hypertrophy)    Chronic lymphocytic leukemia (CLL), T-cell (HCC) DX 1996--  ONCOLOGIST-  DR Scherrie Curt   PT IS ASYMPTOMATIC--- LAST CBC W/ DIFF 06-24-2012 STABLE   Coronary artery disease CARDIOLOGIST- DR Kay Parson   S/P STENTING LAD 1999 // Myoview  01/2019: EF 62, normal perfusion; Low Risk   Crohn's disease of ileum (HCC) SINCE 1988   ED (erectile dysfunction)    Elevated PSA    Essential and other specified forms of tremor 11/25/2012   Gait abnormality 05/17/2020   H/O adenomatous polyp of colon    Hyperlipidemia    Hypertension    Nocturia    OA (osteoarthritis)    Peripheral neuropathy    hx of, none current as of 08-04-13   Peyronie disease    S/P coronary artery stent placement OCT 1999 OF LAD   Tremor, hereditary, benign MILD RIGHT HAND   Past Surgical History:  Procedure Laterality Date   CARDIOVASCULAR STRESS TEST  11-01-2010 DR Kay Parson   NORMAL PERFUSION STUDY/ EF 64%/ NO ISCHEMIA   cataract surgery  Bilateral feburary 2020   with lens placement    COLONOSCOPY WITH PROPOFOL  N/A 09/24/2012   Procedure: COLONOSCOPY WITH PROPOFOL ;  Surgeon: Garrett Kallman, MD;  Location: [facility];  Service: Endoscopy;  Laterality: N/A;   CORONARY ANGIOPLASTY WITH STENT PLACEMENT  OCT 1999    STENT OF LAD   CORONARY STENT INTERVENTION N/A 02/14/2019   Procedure: CORONARY STENT INTERVENTION;  Surgeon: Arty Binning, MD;  [facility]: [facility];  Service: Cardiovascular;  Laterality: N/A;   CYSTOSCOPY WITH URETHRAL DILATATION  03/12/2017   Procedure: CYSTOSCOPY WITH URETHRAL DILATATION;  Surgeon: Liliane Rei, MD;  Location: [facility];  Service: Orthopedics;;  Arden Kotyk, Resident Assisting   CYSTOSCOPY WITH URETHRAL DILATATION N/A 10/03/2018   Procedure: CYSTOSCOPY WITH URETHRAL BALLOON DILATATION WITH BILATERAL RETROGRADE PYELOGRAPHY;  Surgeon: Florencio Hunting, MD;  Location: [facility];  Service: Urology;  Laterality: N/A;   CYSTOSCOPY WITH URETHRAL DILATATION N/A 12/30/2020   Procedure: CYSTOSCOPY WITH BALLOON DILATATION OF URETHRAL STRICTURE;  Surgeon: Florencio Hunting, MD;  Location: [facility];  Service: Urology;  Laterality: N/A;   LAPAROSCOPIC INGUINAL HERNIA REPAIR Bilateral 01-10-2004   W/ MESH   LEFT HEART CATH AND CORONARY ANGIOGRAPHY N/A 02/13/2019   Procedure: LEFT HEART CATH AND CORONARY ANGIOGRAPHY;  Surgeon: Arty Binning, MD;  [facility];  Service: Cardiovascular;  Laterality: N/A;   neck benign removed from neck  yrs ago   PROSTATE BIOPSY N/A 07/12/2012   Procedure: PROSTATE BIOPSY AND ULTRASOUND;  Surgeon: Edmund Gouge, MD;  [facility];  Service: Urology;  Laterality: N/A;   PROSTATE SURGERY  2002   tuna   REMOVAL LEFT NECK LYMPH NODE  1996   TONSILLECTOMY  CHILD   TOTAL KNEE ARTHROPLASTY Right 03/12/2017   Procedure: RIGHT TOTAL KNEE ARTHROPLASTY;  Surgeon: Liliane Rei, MD;  Location: [facility];  Service: Orthopedics;  Laterality: Right;  Adductor Block   TRANSURETHRAL RESECTION OF PROSTATE N/A 08/08/2013   Procedure: TRANSURETHRAL RESECTION OF THE PROSTATE WITH GYRUS INSTRUMENTS;  Surgeon: Edmund Gouge, MD;  Location: [facility];  Service: Urology;  Laterality: N/A;   TRANSURETHRAL RESECTION OF PROSTATE     Patient  Active Problem List   Diagnosis Date Noted   Immunization counseling 12/20/2022   Cellulitis 12/20/2022   Medication management 12/20/2022   Adult attention deficit disorder 06/04/2020   Chronic lymphocytic leukemia (HCC) 06/04/2020   Conductive hearing loss, bilateral 06/04/2020   Crohn's ileitis (HCC) 06/04/2020   Disorder of musculoskeletal system 06/04/2020   Erectile dysfunction 06/04/2020   Gastroesophageal reflux disease 06/04/2020   Gout 06/04/2020   History of adenomatous polyp of colon 06/04/2020   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   Hypothyroidism 06/04/2020   Increased frequency of urination 06/04/2020   Male hypogonadism 06/04/2020   Muscle pain 06/04/2020   Nocturia 06/04/2020   Obesity 06/04/2020   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   Osteopenia 06/04/2020   Peripheral neurogenic pain 06/04/2020   Recurrent falls 06/04/2020   Unspecified kyphosis, thoracic region 06/04/2020   Vitamin B12 deficiency 06/04/2020   Vitamin D deficiency 06/04/2020   Gait abnormality 05/17/2020   Ataxia 03/16/2020   Angina pectoris (HCC) 02/13/2019   Pseudophakia of both eyes 08/01/2018   History of total knee arthroplasty 03/29/2017   Stiffness of right knee 03/16/2017   OA (osteoarthritis) of knee 03/12/2017   Tremor, essential 12/03/2015   Essential hypertension 12/09/2014   CAD (coronary artery disease) 12/08/2013   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   Pure hypercholesterolemia 12/08/2013   Benign prostatic hyperplasia 08/08/2013   Essential and other specified forms of tremor 11/25/2012    PCP: Benedetta Bradley, MD  REFERRING PROVIDER: Benedetta Bradley, MD  REFERRING DIAG: R26.89  Balance disorder              R26.81  Gait Instability    M62.81 lower extremity weakness  THERAPY DIAG:  Other abnormalities of gait and mobility  Muscle weakness (generalized)  Other lack of coordination  Rationale for Evaluation and  Treatment: Rehabilitation  ONSET DATE: PT order 03/20/2023  SUBJECTIVE:   SUBJECTIVE STATEMENT: Pt states no falls.  Pt has been performing exercises 3x/wk at the gym, but not performing them at home.  Pt denies any adverse effects after prior Rx.  Pt feels good in the water .  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   PRECAUTIONS: Fall and Other: R TKA, Hx of CLL   WEIGHT BEARING RESTRICTIONS: No  FALLS:  Has patient fallen in last 6 months? Yes. Number of falls 1 when slipping on ice.  3 now.  06/28/23  LIVING ENVIRONMENT: Lives with: lives with their spouse Lives in: 2 story home with rails Stairs: yes Has following equipment at home:  cane, FWW  OCCUPATION: CPA, still works some  PLOF: Independent  PATIENT GOALS:  improve strength, feel more comfortable with walking, not feel that he is going to fall  OBJECTIVE:  Note: Objective measures were completed at Evaluation unless otherwise noted.  DIAGNOSTIC FINDINGS: N/A                                                                                                                               TREATMENT DATE:   08/09/2023 Pt had questions concerning his HEP and using the Medbridge[employer] app.  PT spent time going over his HEP and educating pt in how to use the app for his home exercises.  PT answered pt's questions concerning HEP.     Sidestepping x 2 laps at rail Half tandem stance without UE support x 30 sec each Tandem stance x 30 sec with UE support  Tandem gait x 4 laps with rail Standing hip abd 2x10 with UE support on rail Lateral stepping over hurdle with rail and SBA/CGA Box pattern with amb fwd, sidestepping, and amb bwd with SBA and occasional  CGA with amb bwd.  DATE: 08/08/23 Pt seen for aquatic therapy today.  Treatment took place in water  3.5-4.75 ft in depth at the [facility] pool. Temp of water  was 91.  Pt entered/exited the pool via stairs with hand rail.   *walking forward, back and side stepping ue support yellow HB *tree pose in 3.7 ft ue support RBHB: then wall releasing when balanced.  Best hold with SLS Left 8s *Side stepping "stepping over a box" Staggered stance with ue horizontal add/abd *[place] [employer] [place] *Standing arm swing with green bells 5 sow/5 fast x 3 sets wide stance then staggered stances *tandem stance leading R/L UE add/abd using RBHB in 3.74ft(good challenge)  *Solid noodle press for TrA engagement wide stance then staggered x 10 in 3.9ft.  VC and demonstration for proper execution     Pt requires the buoyancy and hydrostatic pressure of water  for support, and to offload joints by unweighting joint load by at least 50 % in navel deep water  and by at least 75-80% in chest to neck deep water .  Viscosity of the water  is needed for resistance of strengthening. Water  current perturbations provides challenge to standing balance requiring increased core activation.   5/16 5x STS test:  Initial:  16.4 sec without Ue's. Pt did use legs on the back of the chair.  Current:  9.69 without Ue's.    LE strength: Hip flexion:  R:  4+/5, L:  5/5  Hip abduction: R:  32.8, L:  38.1  TUG:  Initial/prior/current:  13.73 with cane / 10.14 / 11.39 without AD  ABC scale:  Initial/Prior/Current: 75% / 75% / 37% indicated pt being at low level of physical conditioning and at risk for falls      PATIENT EDUCATION:  Education details: HEP including the app, relevant anatomy, POC, dx, and rationale  of interventions. Person educated: Patient Education method: Explanation, demonstration, verbal and tactile cues Education comprehension: verbalized understanding, returned demonstration, verbal and tactile cues  required  HOME EXERCISE PROGRAM: Access Code: [email or web address] URL: [email or web address] Date: 04/23/2023 Prepared by: Marnie Siren  Updated HEP: - Tandem Walking with Counter Support  - 1 x daily - 5-7 x weekly - 3 reps   ASSESSMENT:  CLINICAL IMPRESSION: Pt had questions concerning his exercises on the [employer]bridge[employer] app.  PT spent time instructing him how to use the app on his phone to get his proper exercise program and exercise videos.  Pt has several home programs on the app on his phone and PT showed pt how to bring up the program and see the videos.  PT reviewed and updated HEP.  Pt didn't require UE support with half tandem stance though did require UE support with tandem stance.  Pt required UE support with tandem gait also.  Pt externally rotates LE and has decreased control when performing standing hip abd.  He did well with performing the box pattern requiring occasional CGA with ambulating backwards.  Pt responded well to Rx having no c/o's after Rx.   Pt is independent with land based HEP.  He will cont with aquatic program.     OBJECTIVE IMPAIRMENTS: Abnormal gait, decreased activity tolerance, decreased balance, decreased coordination, decreased endurance, decreased mobility, difficulty walking, decreased strength, and postural dysfunction.   ACTIVITY LIMITATIONS: carrying, lifting, standing, squatting, and locomotion level  PARTICIPATION LIMITATIONS: shopping and community activity  PERSONAL FACTORS: Time since onset of injury/illness/exacerbation and 3+ comorbidities: FXAS, R TKA, peripheral neuropathy, CLL are also affecting patient's functional outcome.   REHAB POTENTIAL: Good  CLINICAL DECISION MAKING: Evolving/moderate complexity  EVALUATION COMPLEXITY: Moderate   GOALS:  SHORT TERM GOALS: Target date: 05/11/2023  Pt will be independent with HEP for improved balance, strength, and functional mobility. Baseline: Goal status: GOAL MET  07/20/23  2.  Pt  will demo improved stability with turning with gait.  Baseline:  Goal status: GOAL MET  07/20/23  3.  Pt will perform TUG safely including turning and and performing stand to sit transfer with good control and without assistance.  Baseline:  Goal status: GOAL MET  07/20/23  4.  Pt will be able to perform tandem stance for at least 20 seconds for improved balance and stability with daily activities.  Baseline:  Goal status: in progress 05/24/23 Target date:  05/18/2023   LONG TERM GOALS: Target date: 08/31/2023   Pt will demo improved time on 5x STS test by 3 seconds and also improved form by not using his legs on the back of the chair for improved functional LE strength and performance of transfers.  Baseline:  Goal status: GOAL MET  5/16  2.  Pt will demo improved R hip flexion strength to 5/5 MMT and bilat hip abd strength by at least 6-8 lbs for improved strength to perform functional mobility with increased ease and less difficulty.  Baseline:  Goal status: 66% MET   3.  (Will set Berg goal next visit after testing. )Pt will improve on Berg balance test to >/= 36/56 to demonstrate a decrease in fall risk. Baseline: 26/56 06/05/23 Goal status: in progress 06/05/23  4.  Pt will report at least a 70% improvement in stability with gait and community ambulation.  Baseline:  Goal status: PROGRESSING THOUGH NOT MET  07/20/23  5.  Pt will report improved confidence with his daily functional mobility.  Baseline:  Goal  status: PARTIALLY MET  5/16     PLAN:  PT FREQUENCY: 2x/week  PT DURATION:  4-6 weeks  PLANNED INTERVENTIONS: 97164- PT Re-evaluation, 97110-Therapeutic exercises, 97530- Therapeutic activity, 97112- Neuromuscular re-education, 97535- Self Care, 14782- Manual therapy, [phone]- Gait training, [phone]- Aquatic Therapy, Patient/Family education, Balance training, Stair training, and Taping  PLAN FOR NEXT SESSION: Cont with Balance and Gait training and strengthening.  Cont  with aquatic and land based PT.   Trina Fujita III PT, DPT 08/10/23 2:31 PM  [facility] [address] [place], [place], [address] Phone: [phone]   Fax:  [phone]

## 2023-08-10 ENCOUNTER — Encounter (HOSPITAL_BASED_OUTPATIENT_CLINIC_OR_DEPARTMENT_OTHER): Payer: Self-pay | Admitting: Physical Therapy

## 2023-08-13 DIAGNOSIS — I2510 Atherosclerotic heart disease of native coronary artery without angina pectoris: Secondary | ICD-10-CM | POA: Diagnosis not present

## 2023-08-15 ENCOUNTER — Ambulatory Visit (HOSPITAL_BASED_OUTPATIENT_CLINIC_OR_DEPARTMENT_OTHER): Admitting: Physical Therapy

## 2023-08-15 ENCOUNTER — Encounter (HOSPITAL_BASED_OUTPATIENT_CLINIC_OR_DEPARTMENT_OTHER): Payer: Self-pay | Admitting: Physical Therapy

## 2023-08-15 DIAGNOSIS — R278 Other lack of coordination: Secondary | ICD-10-CM

## 2023-08-15 DIAGNOSIS — R2689 Other abnormalities of gait and mobility: Secondary | ICD-10-CM

## 2023-08-15 DIAGNOSIS — M6281 Muscle weakness (generalized): Secondary | ICD-10-CM

## 2023-08-15 NOTE — Therapy (Signed)
 OUTPATIENT PHYSICAL THERAPY LOWER EXTREMITY TREATMENT      Patient Name: Cole Martinez MRN: [id number] DOB:07-27-1945, 78 y.o., male Today's Date: 08/16/2023  END OF SESSION:  PT End of Session - 08/15/23 1628     Visit Number 24    Number of Visits 28    Date for PT Re-Evaluation 08/31/23    Authorization Type MCR A and B    PT Start Time 1627    PT Stop Time 1712    PT Time Calculation (min) 45 min    Activity Tolerance Patient tolerated treatment well    Behavior During Therapy WFL for tasks assessed/performed                Past Medical History:  Diagnosis Date   ADHD (attention deficit hyperactivity disorder)    B12 deficiency    BPH (benign prostatic hypertrophy)    Chronic lymphocytic leukemia (CLL), T-cell (HCC) DX 1996--  ONCOLOGIST-  DR Scherrie Curt   PT IS ASYMPTOMATIC--- LAST CBC W/ DIFF 06-24-2012 STABLE   Coronary artery disease CARDIOLOGIST- DR Kay Parson   S/P STENTING LAD 1999 // Myoview  01/2019: EF 62, normal perfusion; Low Risk   Crohn's disease of ileum (HCC) SINCE 1988   ED (erectile dysfunction)    Elevated PSA    Essential and other specified forms of tremor 11/25/2012   Gait abnormality 05/17/2020   H/O adenomatous polyp of colon    Hyperlipidemia    Hypertension    Nocturia    OA (osteoarthritis)    Peripheral neuropathy    hx of, none current as of 08-04-13   Peyronie disease    S/P coronary artery stent placement OCT 1999 OF LAD   Tremor, hereditary, benign MILD RIGHT HAND   Past Surgical History:  Procedure Laterality Date   CARDIOVASCULAR STRESS TEST  11-01-2010 DR Kay Parson   NORMAL PERFUSION STUDY/ EF 64%/ NO ISCHEMIA   cataract surgery  Bilateral feburary 2020   with lens placement    COLONOSCOPY WITH PROPOFOL  N/A 09/24/2012   Procedure: COLONOSCOPY WITH PROPOFOL ;  Surgeon: Garrett Kallman, MD;  Location: [facility];  Service: Endoscopy;  Laterality: N/A;   CORONARY ANGIOPLASTY WITH STENT PLACEMENT  OCT 1999   STENT OF  LAD   CORONARY STENT INTERVENTION N/A 02/14/2019   Procedure: CORONARY STENT INTERVENTION;  Surgeon: Arty Binning, MD;  Location: [facility];  Service: Cardiovascular;  Laterality: N/A;   CYSTOSCOPY WITH URETHRAL DILATATION  03/12/2017   Procedure: CYSTOSCOPY WITH URETHRAL DILATATION;  Surgeon: Liliane Rei, MD;  Location: [facility];  Service: Orthopedics;;  Arden Kotyk, Resident Assisting   CYSTOSCOPY WITH URETHRAL DILATATION N/A 10/03/2018   Procedure: CYSTOSCOPY WITH URETHRAL BALLOON DILATATION WITH BILATERAL RETROGRADE PYELOGRAPHY;  Surgeon: Florencio Hunting, MD;  Location: [facility];  Service: Urology;  Laterality: N/A;   CYSTOSCOPY WITH URETHRAL DILATATION N/A 12/30/2020   Procedure: CYSTOSCOPY WITH BALLOON DILATATION OF URETHRAL STRICTURE;  Surgeon: Florencio Hunting, MD;  Location: [facility];  Service: Urology;  Laterality: N/A;   LAPAROSCOPIC INGUINAL HERNIA REPAIR Bilateral 01-10-2004   W/ MESH   LEFT HEART CATH AND CORONARY ANGIOGRAPHY N/A 02/13/2019   Procedure: LEFT HEART CATH AND CORONARY ANGIOGRAPHY;  Surgeon: Arty Binning, MD;  Location: [facility];  Service: Cardiovascular;  Laterality: N/A;   neck benign removed from neck  yrs ago   PROSTATE BIOPSY N/A 07/12/2012   Procedure: PROSTATE BIOPSY AND ULTRASOUND;  Surgeon: Edmund Gouge, MD;  Location: [facility];  Service: Urology;  Laterality: N/A;   PROSTATE SURGERY  2002   tuna   REMOVAL LEFT NECK LYMPH NODE  1996   TONSILLECTOMY  CHILD   TOTAL KNEE ARTHROPLASTY Right 03/12/2017   Procedure: RIGHT TOTAL KNEE ARTHROPLASTY;  Surgeon: Liliane Rei, MD;  Location: [facility];  Service: Orthopedics;  Laterality: Right;  Adductor Block   TRANSURETHRAL RESECTION OF PROSTATE N/A 08/08/2013   Procedure: TRANSURETHRAL RESECTION OF THE PROSTATE WITH GYRUS INSTRUMENTS;  Surgeon: Edmund Gouge, MD;  Location: [facility];  Service: Urology;  Laterality: N/A;   TRANSURETHRAL RESECTION OF PROSTATE     Patient Active  Problem List   Diagnosis Date Noted   Immunization counseling 12/20/2022   Cellulitis 12/20/2022   Medication management 12/20/2022   Adult attention deficit disorder 06/04/2020   Chronic lymphocytic leukemia (HCC) 06/04/2020   Conductive hearing loss, bilateral 06/04/2020   Crohn's ileitis (HCC) 06/04/2020   Disorder of musculoskeletal system 06/04/2020   Erectile dysfunction 06/04/2020   Gastroesophageal reflux disease 06/04/2020   Gout 06/04/2020   History of adenomatous polyp of colon 06/04/2020   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   Hypothyroidism 06/04/2020   Increased frequency of urination 06/04/2020   Male hypogonadism 06/04/2020   Muscle pain 06/04/2020   Nocturia 06/04/2020   Obesity 06/04/2020   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   Osteopenia 06/04/2020   Peripheral neurogenic pain 06/04/2020   Recurrent falls 06/04/2020   Unspecified kyphosis, thoracic region 06/04/2020   Vitamin B12 deficiency 06/04/2020   Vitamin D deficiency 06/04/2020   Gait abnormality 05/17/2020   Ataxia 03/16/2020   Angina pectoris (HCC) 02/13/2019   Pseudophakia of both eyes 08/01/2018   History of total knee arthroplasty 03/29/2017   Stiffness of right knee 03/16/2017   OA (osteoarthritis) of knee 03/12/2017   Tremor, essential 12/03/2015   Essential hypertension 12/09/2014   CAD (coronary artery disease) 12/08/2013   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   Pure hypercholesterolemia 12/08/2013   Benign prostatic hyperplasia 08/08/2013   Essential and other specified forms of tremor 11/25/2012    PCP: Benedetta Bradley, MD  REFERRING PROVIDER: Benedetta Bradley, MD  REFERRING DIAG: R26.89  Balance disorder              R26.81  Gait Instability    M62.81 lower extremity weakness  THERAPY DIAG:  Other abnormalities of gait and mobility  Muscle weakness (generalized)  Other lack of coordination  Rationale for Evaluation and Treatment:  Rehabilitation  ONSET DATE: PT order 03/20/2023  SUBJECTIVE:   SUBJECTIVE STATEMENT: Pt likes to perform his home exercises when he is at the gym.  Pt states he can't get the video of tandem gait on the app.  Pt denies any adverse effects after prior Rx.       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   PRECAUTIONS: Fall and Other: R TKA, Hx of CLL   WEIGHT BEARING RESTRICTIONS: No  FALLS:  Has patient fallen in last 6 months? Yes. Number of falls 1 when slipping on ice.  3 now.  06/28/23  LIVING ENVIRONMENT: Lives with: lives with their spouse Lives in: 2 story home with rails Stairs: yes Has following equipment at home:  cane, FWW  OCCUPATION: IT trainer,  still works some  PLOF: Independent  PATIENT GOALS:  improve strength, feel more comfortable with walking, not feel that he is going to fall  OBJECTIVE:  Note: Objective measures were completed at Evaluation unless otherwise noted.  DIAGNOSTIC FINDINGS: N/A                                                                                                                               TREATMENT DATE:   6/11/20256/07/2023 PT spent time educating pt concerning HEP on [employer]bridge[employer] app.  Tandem gait was not showing up as a video on HEP.  PT answered pt's questions concerning HEP.     Tandem gait x 4 laps with rail Box pattern with amb fwd, sidestepping, and amb bwd with SBA and occasional CGA with amb bwd.  Ambulating over 4 hurdles with a reciprocal gait with the rail with SBA/CGA.  PT also added an airex pad at the end during the latter reps.  Sidestepping on airex beams with UE support and CGA  Ambulating around cones and sidestepping around cones with SBA/CGA  Ambulating with head  turns with CGA and min assist down hallway Step ups on 6 inch step with rail      PATIENT EDUCATION:  Education details: HEP including the app, relevant anatomy, POC, dx, exercise form, and rationale of interventions. Person educated: Patient Education method: Explanation, demonstration, verbal and tactile cues Education comprehension: verbalized understanding, returned demonstration, verbal and tactile cues required  HOME EXERCISE PROGRAM: Access Code: [email or web address] URL: [email or web address] Date: 04/23/2023 Prepared by: Marnie Siren   ASSESSMENT:  CLINICAL IMPRESSION: PT spent time instructing pt with [employer] app trying to show pt how to see the video on the app.  The exercise is on the list, but the video doesn't show up consistently.  PT demonstrated to pt how to see the picture on the list and pt demonstrates good understanding.  PT worked on Editor, commissioning today and Pt did well.  Pt requires UE support with ambulating over hurdles, tandem gait, step ups, and sidestepping on airex beam.  Pt had small LOB's with ambulating with head turns and used the wall for support.  PT also provided min assistance for Pt LOB's.  Pt had improved control with step ups though did use the rail.  He responded well to Rx having no c/o's after Rx.  He will cont with aquatic program.     OBJECTIVE IMPAIRMENTS: Abnormal gait, decreased activity tolerance, decreased balance, decreased coordination, decreased endurance, decreased mobility, difficulty walking, decreased strength, and postural dysfunction.   ACTIVITY LIMITATIONS: carrying, lifting, standing, squatting, and locomotion level  PARTICIPATION LIMITATIONS: shopping and community activity  PERSONAL FACTORS: Time since onset of injury/illness/exacerbation and 3+ comorbidities: FXAS, R TKA, peripheral neuropathy, CLL are also affecting patient's functional outcome.   REHAB POTENTIAL: Good  CLINICAL DECISION MAKING:  Evolving/moderate complexity  EVALUATION COMPLEXITY: Moderate   GOALS:  SHORT TERM GOALS: Target date: 05/11/2023  Pt will be  independent with HEP for improved balance, strength, and functional mobility. Baseline: Goal status: GOAL MET  07/20/23  2.  Pt will demo improved stability with turning with gait.  Baseline:  Goal status: GOAL MET  07/20/23  3.  Pt will perform TUG safely including turning and and performing stand to sit transfer with good control and without assistance.  Baseline:  Goal status: GOAL MET  07/20/23  4.  Pt will be able to perform tandem stance for at least 20 seconds for improved balance and stability with daily activities.  Baseline:  Goal status: in progress 05/24/23 Target date:  05/18/2023   LONG TERM GOALS: Target date: 08/31/2023   Pt will demo improved time on 5x STS test by 3 seconds and also improved form by not using his legs on the back of the chair for improved functional LE strength and performance of transfers.  Baseline:  Goal status: GOAL MET  5/16  2.  Pt will demo improved R hip flexion strength to 5/5 MMT and bilat hip abd strength by at least 6-8 lbs for improved strength to perform functional mobility with increased ease and less difficulty.  Baseline:  Goal status: 66% MET   3.  (Will set Berg goal next visit after testing. )Pt will improve on Berg balance test to >/= 36/56 to demonstrate a decrease in fall risk. Baseline: 26/56 06/05/23 Goal status: in progress 06/05/23  4.  Pt will report at least a 70% improvement in stability with gait and community ambulation.  Baseline:  Goal status: PROGRESSING THOUGH NOT MET  07/20/23  5.  Pt will report improved confidence with his daily functional mobility.  Baseline:  Goal status: PARTIALLY MET  5/16     PLAN:  PT FREQUENCY: 2x/week  PT DURATION:  4-6 weeks  PLANNED INTERVENTIONS: 97164- PT Re-evaluation, 97110-Therapeutic exercises, 97530- Therapeutic activity, 97112-  Neuromuscular re-education, 97535- Self Care, 16109- Manual therapy, [phone]- Gait training, [phone]- Aquatic Therapy, Patient/Family education, Balance training, Stair training, and Taping  PLAN FOR NEXT SESSION: Cont with Balance and Gait training and strengthening.  Cont with aquatic therapy.   Trina Fujita III PT, DPT 08/16/23 7:34 AM   [facility] [address] [place], [place], [address] Phone: [phone]   Fax:  [phone]

## 2023-08-16 ENCOUNTER — Encounter (HOSPITAL_BASED_OUTPATIENT_CLINIC_OR_DEPARTMENT_OTHER): Payer: Self-pay | Admitting: Physical Therapy

## 2023-08-16 ENCOUNTER — Ambulatory Visit (HOSPITAL_BASED_OUTPATIENT_CLINIC_OR_DEPARTMENT_OTHER): Payer: Self-pay | Admitting: Physical Therapy

## 2023-08-16 DIAGNOSIS — R278 Other lack of coordination: Secondary | ICD-10-CM

## 2023-08-16 DIAGNOSIS — R2689 Other abnormalities of gait and mobility: Secondary | ICD-10-CM | POA: Diagnosis not present

## 2023-08-16 DIAGNOSIS — M6281 Muscle weakness (generalized): Secondary | ICD-10-CM

## 2023-08-16 NOTE — Therapy (Signed)
 OUTPATIENT PHYSICAL THERAPY LOWER EXTREMITY TREATMENT      Patient Name: Cole Martinez MRN: [id number] DOB:03/31/1945, 78 y.o., male Today's Date: 08/16/2023  END OF SESSION:  PT End of Session - 08/16/23 0802     Visit Number 25    Number of Visits 28    Date for PT Re-Evaluation 08/31/23    Authorization Type MCR A and B    PT Start Time 0801    PT Stop Time 0840    PT Time Calculation (min) 39 min    Activity Tolerance Patient tolerated treatment well    Behavior During Therapy WFL for tasks assessed/performed                Past Medical History:  Diagnosis Date   ADHD (attention deficit hyperactivity disorder)    B12 deficiency    BPH (benign prostatic hypertrophy)    Chronic lymphocytic leukemia (CLL), T-cell (HCC) DX 1996--  ONCOLOGIST-  DR Scherrie Curt   PT IS ASYMPTOMATIC--- LAST CBC W/ DIFF 06-24-2012 STABLE   Coronary artery disease CARDIOLOGIST- DR Kay Parson   S/P STENTING LAD 1999 // Myoview  01/2019: EF 62, normal perfusion; Low Risk   Crohn's disease of ileum (HCC) SINCE 1988   ED (erectile dysfunction)    Elevated PSA    Essential and other specified forms of tremor 11/25/2012   Gait abnormality 05/17/2020   H/O adenomatous polyp of colon    Hyperlipidemia    Hypertension    Nocturia    OA (osteoarthritis)    Peripheral neuropathy    hx of, none current as of 08-04-13   Peyronie disease    S/P coronary artery stent placement OCT 1999 OF LAD   Tremor, hereditary, benign MILD RIGHT HAND   Past Surgical History:  Procedure Laterality Date   CARDIOVASCULAR STRESS TEST  11-01-2010 DR Kay Parson   NORMAL PERFUSION STUDY/ EF 64%/ NO ISCHEMIA   cataract surgery  Bilateral feburary 2020   with lens placement    COLONOSCOPY WITH PROPOFOL  N/A 09/24/2012   Procedure: COLONOSCOPY WITH PROPOFOL ;  Surgeon: Garrett Kallman, MD;  Location: [facility];  Service: Endoscopy;  Laterality: N/A;   CORONARY ANGIOPLASTY WITH STENT PLACEMENT  OCT 1999   STENT OF  LAD   CORONARY STENT INTERVENTION N/A 02/14/2019   Procedure: CORONARY STENT INTERVENTION;  Surgeon: Arty Binning, MD;  Location: [facility];  Service: Cardiovascular;  Laterality: N/A;   CYSTOSCOPY WITH URETHRAL DILATATION  03/12/2017   Procedure: CYSTOSCOPY WITH URETHRAL DILATATION;  Surgeon: Liliane Rei, MD;  Location: [facility];  Service: Orthopedics;;  Arden Kotyk, Resident Assisting   CYSTOSCOPY WITH URETHRAL DILATATION N/A 10/03/2018   Procedure: CYSTOSCOPY WITH URETHRAL BALLOON DILATATION WITH BILATERAL RETROGRADE PYELOGRAPHY;  Surgeon: Florencio Hunting, MD;  Location: [facility];  Service: Urology;  Laterality: N/A;   CYSTOSCOPY WITH URETHRAL DILATATION N/A 12/30/2020   Procedure: CYSTOSCOPY WITH BALLOON DILATATION OF URETHRAL STRICTURE;  Surgeon: Florencio Hunting, MD;  Location: [facility];  Service: Urology;  Laterality: N/A;   LAPAROSCOPIC INGUINAL HERNIA REPAIR Bilateral 01-10-2004   W/ MESH   LEFT HEART CATH AND CORONARY ANGIOGRAPHY N/A 02/13/2019   Procedure: LEFT HEART CATH AND CORONARY ANGIOGRAPHY;  Surgeon: Arty Binning, MD;  Location: [facility];  Service: Cardiovascular;  Laterality: N/A;   neck benign removed from neck  yrs ago   PROSTATE BIOPSY N/A 07/12/2012   Procedure: PROSTATE BIOPSY AND ULTRASOUND;  Surgeon: Edmund Gouge, MD;  Location: [facility];  Service: Urology;  Laterality: N/A;   PROSTATE SURGERY  2002   tuna   REMOVAL LEFT NECK LYMPH NODE  1996   TONSILLECTOMY  CHILD   TOTAL KNEE ARTHROPLASTY Right 03/12/2017   Procedure: RIGHT TOTAL KNEE ARTHROPLASTY;  Surgeon: Liliane Rei, MD;  Location: [facility];  Service: Orthopedics;  Laterality: Right;  Adductor Block   TRANSURETHRAL RESECTION OF PROSTATE N/A 08/08/2013   Procedure: TRANSURETHRAL RESECTION OF THE PROSTATE WITH GYRUS INSTRUMENTS;  Surgeon: Edmund Gouge, MD;  Location: [facility];  Service: Urology;  Laterality: N/A;   TRANSURETHRAL RESECTION OF PROSTATE     Patient Active  Problem List   Diagnosis Date Noted   Immunization counseling 12/20/2022   Cellulitis 12/20/2022   Medication management 12/20/2022   Adult attention deficit disorder 06/04/2020   Chronic lymphocytic leukemia (HCC) 06/04/2020   Conductive hearing loss, bilateral 06/04/2020   Crohn's ileitis (HCC) 06/04/2020   Disorder of musculoskeletal system 06/04/2020   Erectile dysfunction 06/04/2020   Gastroesophageal reflux disease 06/04/2020   Gout 06/04/2020   History of adenomatous polyp of colon 06/04/2020   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   Hypothyroidism 06/04/2020   Increased frequency of urination 06/04/2020   Male hypogonadism 06/04/2020   Muscle pain 06/04/2020   Nocturia 06/04/2020   Obesity 06/04/2020   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   Osteopenia 06/04/2020   Peripheral neurogenic pain 06/04/2020   Recurrent falls 06/04/2020   Unspecified kyphosis, thoracic region 06/04/2020   Vitamin B12 deficiency 06/04/2020   Vitamin D deficiency 06/04/2020   Gait abnormality 05/17/2020   Ataxia 03/16/2020   Angina pectoris (HCC) 02/13/2019   Pseudophakia of both eyes 08/01/2018   History of total knee arthroplasty 03/29/2017   Stiffness of right knee 03/16/2017   OA (osteoarthritis) of knee 03/12/2017   Tremor, essential 12/03/2015   Essential hypertension 12/09/2014   CAD (coronary artery disease) 12/08/2013   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   Pure hypercholesterolemia 12/08/2013   Benign prostatic hyperplasia 08/08/2013   Essential and other specified forms of tremor 11/25/2012    PCP: Benedetta Bradley, MD  REFERRING PROVIDER: Benedetta Bradley, MD  REFERRING DIAG: R26.89  Balance disorder              R26.81  Gait Instability    M62.81 lower extremity weakness  THERAPY DIAG:  Other abnormalities of gait and mobility  Muscle weakness (generalized)  Other lack of coordination  Rationale for Evaluation and Treatment:  Rehabilitation  ONSET DATE: PT order 03/20/2023  SUBJECTIVE:   SUBJECTIVE STATEMENT: Pt reports completion with Land based intervention.  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   PRECAUTIONS: Fall and Other: R TKA, Hx of CLL   WEIGHT BEARING RESTRICTIONS: No  FALLS:  Has patient fallen in last 6 months? Yes. Number of falls 1 when slipping on ice.  3 now.  06/28/23  LIVING ENVIRONMENT: Lives with: lives with their spouse Lives in: 2 story home with rails Stairs: yes Has following equipment at home:  cane, FWW  OCCUPATION: IT trainer, still works some  PLOF: Independent  PATIENT GOALS:  improve strength, feel more comfortable with walking, not feel that he is going to fall  OBJECTIVE:  Note: Objective measures were completed at Evaluation unless otherwise noted.  DIAGNOSTIC FINDINGS: N/A                                                                                                                               TREATMENT DATE:   DATE: 08/08/23 Pt seen for aquatic therapy today.  Treatment took place in water  3.5-4.75 ft in depth at the [facility] pool. Temp of water  was 91.  Pt entered/exited the pool via stairs with hand rail.   *walking forward, back and side stepping unsupported *tandem and SLS challenges in 3.6 ft then 3 ft (standing on water  step) unsupported *FT with vision then VE added RBHB for improved success with challenge *RBHB press for TrA engagement wide stance then staggered x 10 in 3.40ft.    Pt requires the buoyancy and hydrostatic pressure of water  for support, and to offload joints by unweighting joint load by at least 50 % in navel deep water  and by at least 75-80% in  chest to neck deep water .  Viscosity of the water  is needed for resistance of strengthening. Water  current perturbations provides challenge to standing balance requiring increased core activation.   08/15/2023 PT spent time educating pt concerning HEP on MedbridgeGO app.  Tandem gait was not showing up as a video on HEP.  PT answered pt's questions concerning HEP.     Tandem gait x 4 laps with rail Box pattern with amb fwd, sidestepping, and amb bwd with SBA and occasional CGA with amb bwd.  Ambulating over 4 hurdles with a reciprocal gait with the rail with SBA/CGA.  PT also added an airex pad at the end during the latter reps.  Sidestepping on airex beams with UE support and CGA  Ambulating around cones and sidestepping around cones with SBA/CGA  Ambulating with head turns with CGA and min assist down hallway Step ups on 6 inch step with rail      PATIENT EDUCATION:  Education details: HEP including the app, relevant anatomy, POC, dx, exercise form, and rationale of interventions. Person educated: Patient Education method: Explanation, demonstration, verbal and tactile cues Education comprehension: verbalized understanding, returned demonstration, verbal and tactile cues required  HOME EXERCISE PROGRAM: Access Code: [email or web address] URL: [email or web address] Date: 04/23/2023 Prepared by: Marnie Siren   ASSESSMENT:  CLINICAL IMPRESSION: Continued focus on static and dynamic balance.  Worked on decreasing submersion for added load and challenge.  Execllent execution with tandem stance standing on step 89ft.  Increased challenge with SLS.  Cues for lateral weight shift slightly to reposition over stance leg with improved completion.  Also worked with head turns and VE  PT spent time instructing pt with Medbridge[employer] app trying to show pt how to see the video on the app.  The exercise is on the list, but the video doesn't show up consistently.  PT demonstrated to pt how to see  the picture on the list and pt  demonstrates good understanding.  PT worked on Editor, commissioning today and Pt did well.  Pt requires UE support with ambulating over hurdles, tandem gait, step ups, and sidestepping on airex beam.  Pt had small LOB's with ambulating with head turns and used the wall for support.  PT also provided min assistance for Pt LOB's.  Pt had improved control with step ups though did use the rail.  He responded well to Rx having no c/o's after Rx.  He will cont with aquatic program.     OBJECTIVE IMPAIRMENTS: Abnormal gait, decreased activity tolerance, decreased balance, decreased coordination, decreased endurance, decreased mobility, difficulty walking, decreased strength, and postural dysfunction.   ACTIVITY LIMITATIONS: carrying, lifting, standing, squatting, and locomotion level  PARTICIPATION LIMITATIONS: shopping and community activity  PERSONAL FACTORS: Time since onset of injury/illness/exacerbation and 3+ comorbidities: FXAS, R TKA, peripheral neuropathy, CLL are also affecting patient's functional outcome.   REHAB POTENTIAL: Good  CLINICAL DECISION MAKING: Evolving/moderate complexity  EVALUATION COMPLEXITY: Moderate   GOALS:  SHORT TERM GOALS: Target date: 05/11/2023  Pt will be independent with HEP for improved balance, strength, and functional mobility. Baseline: Goal status: GOAL MET  07/20/23  2.  Pt will demo improved stability with turning with gait.  Baseline:  Goal status: GOAL MET  07/20/23  3.  Pt will perform TUG safely including turning and and performing stand to sit transfer with good control and without assistance.  Baseline:  Goal status: GOAL MET  07/20/23  4.  Pt will be able to perform tandem stance for at least 20 seconds for improved balance and stability with daily activities.  Baseline:  Goal status: in progress 05/24/23 Target date:  05/18/2023   LONG TERM GOALS: Target date: 08/31/2023   Pt will demo improved time on 5x STS  test by 3 seconds and also improved form by not using his legs on the back of the chair for improved functional LE strength and performance of transfers.  Baseline:  Goal status: GOAL MET  5/16  2.  Pt will demo improved R hip flexion strength to 5/5 MMT and bilat hip abd strength by at least 6-8 lbs for improved strength to perform functional mobility with increased ease and less difficulty.  Baseline:  Goal status: 66% MET   3.  (Will set Berg goal next visit after testing. )Pt will improve on Berg balance test to >/= 36/56 to demonstrate a decrease in fall risk. Baseline: 26/56 06/05/23 Goal status: in progress 06/05/23  4.  Pt will report at least a 70% improvement in stability with gait and community ambulation.  Baseline:  Goal status: PROGRESSING THOUGH NOT MET  07/20/23  5.  Pt will report improved confidence with his daily functional mobility.  Baseline:  Goal status: PARTIALLY MET  5/16     PLAN:  PT FREQUENCY: 2x/week  PT DURATION:  4-6 weeks  PLANNED INTERVENTIONS: 97164- PT Re-evaluation, 97110-Therapeutic exercises, 97530- Therapeutic activity, 97112- Neuromuscular re-education, 97535- Self Care, 16109- Manual therapy, [phone]- Gait training, [phone]- Aquatic Therapy, Patient/Family education, Balance training, Stair training, and Taping  PLAN FOR NEXT SESSION: Cont with Balance and Gait training and strengthening.  Cont with aquatic therapy.   [address] [place]) Drue Harr MPT 08/16/23 8:25 AM [facility] [address] [place], [place], [address] Phone: [phone]   Fax:  [phone]

## 2023-08-17 ENCOUNTER — Encounter

## 2023-08-17 DIAGNOSIS — R310 Gross hematuria: Secondary | ICD-10-CM | POA: Diagnosis not present

## 2023-08-21 ENCOUNTER — Encounter (HOSPITAL_BASED_OUTPATIENT_CLINIC_OR_DEPARTMENT_OTHER): Payer: Self-pay | Admitting: Physical Therapy

## 2023-08-21 ENCOUNTER — Ambulatory Visit (HOSPITAL_BASED_OUTPATIENT_CLINIC_OR_DEPARTMENT_OTHER): Admitting: Physical Therapy

## 2023-08-21 DIAGNOSIS — M6281 Muscle weakness (generalized): Secondary | ICD-10-CM | POA: Diagnosis not present

## 2023-08-21 DIAGNOSIS — R278 Other lack of coordination: Secondary | ICD-10-CM | POA: Diagnosis not present

## 2023-08-21 DIAGNOSIS — R2689 Other abnormalities of gait and mobility: Secondary | ICD-10-CM

## 2023-08-21 NOTE — Therapy (Signed)
 OUTPATIENT PHYSICAL THERAPY LOWER EXTREMITY TREATMENT      Patient Name: Cole Martinez MRN: [id number] DOB:[birth year], 78 y.o., male Today's Date: 08/21/2023  END OF SESSION:  PT End of Session - 08/21/23 0846     Visit Number 26    Number of Visits 28    Date for PT Re-Evaluation 08/31/23    Authorization Type MCR A and B    PT Start Time 0800    PT Stop Time 0840    PT Time Calculation (min) 40 min    Activity Tolerance Patient tolerated treatment well    Behavior During Therapy WFL for tasks assessed/performed                 Past Medical History:  Diagnosis Date   ADHD (attention deficit hyperactivity disorder)    B12 deficiency    BPH (benign prostatic hypertrophy)    Chronic lymphocytic leukemia (CLL), T-cell (HCC) DX 1996--  ONCOLOGIST-  DR Scherrie Curt   PT IS ASYMPTOMATIC--- LAST CBC W/ DIFF 06-24-2012 STABLE   Coronary artery disease CARDIOLOGIST- DR Kay Parson   S/P STENTING LAD 1999 // Myoview  01/2019: EF 62, normal perfusion; Low Risk   Crohn's disease of ileum (HCC) SINCE 1988   ED (erectile dysfunction)    Elevated PSA    Essential and other specified forms of tremor 11/25/2012   Gait abnormality 05/17/2020   H/O adenomatous polyp of colon    Hyperlipidemia    Hypertension    Nocturia    OA (osteoarthritis)    Peripheral neuropathy    hx of, none current as of 08-04-13   Peyronie disease    S/P coronary artery stent placement OCT 1999 OF LAD   Tremor, hereditary, benign MILD RIGHT HAND   Past Surgical History:  Procedure Laterality Date   CARDIOVASCULAR STRESS TEST  11-01-2010 DR Kay Parson   NORMAL PERFUSION STUDY/ EF 64%/ NO ISCHEMIA   cataract surgery  Bilateral feburary 2020   with lens placement    COLONOSCOPY WITH PROPOFOL  N/A 09/24/2012   Procedure: COLONOSCOPY WITH PROPOFOL ;  Surgeon: Garrett Kallman, MD;  Location: [facility];  Service: Endoscopy;  Laterality: N/A;   CORONARY ANGIOPLASTY WITH STENT PLACEMENT  OCT 1999   STENT  OF LAD   CORONARY STENT INTERVENTION N/A 02/14/2019   Procedure: CORONARY STENT INTERVENTION;  Surgeon: Arty Binning, MD;  Location: [facility];  Service: Cardiovascular;  Laterality: N/A;   CYSTOSCOPY WITH URETHRAL DILATATION  03/12/2017   Procedure: CYSTOSCOPY WITH URETHRAL DILATATION;  Surgeon: Liliane Rei, MD;  Location: [facility];  Service: Orthopedics;;  Arden Kotyk, Resident Assisting   CYSTOSCOPY WITH URETHRAL DILATATION N/A 10/03/2018   Procedure: CYSTOSCOPY WITH URETHRAL BALLOON DILATATION WITH BILATERAL RETROGRADE PYELOGRAPHY;  Surgeon: Florencio Hunting, MD;  Location: [facility];  Service: Urology;  Laterality: N/A;   CYSTOSCOPY WITH URETHRAL DILATATION N/A 12/30/2020   Procedure: CYSTOSCOPY WITH BALLOON DILATATION OF URETHRAL STRICTURE;  Surgeon: Florencio Hunting, MD;  Location: [facility];  Service: Urology;  Laterality: N/A;   LAPAROSCOPIC INGUINAL HERNIA REPAIR Bilateral 01-10-2004   W/ MESH   LEFT HEART CATH AND CORONARY ANGIOGRAPHY N/A 02/13/2019   Procedure: LEFT HEART CATH AND CORONARY ANGIOGRAPHY;  Surgeon: Arty Binning, MD;  Location: [facility];  Service: Cardiovascular;  Laterality: N/A;   neck benign removed from neck  yrs ago   PROSTATE BIOPSY N/A 07/12/2012   Procedure: PROSTATE BIOPSY AND ULTRASOUND;  Surgeon: Edmund Gouge, MD;  Location: [facility];  Service: Urology;  Laterality: N/A;   PROSTATE SURGERY  2002   tuna   REMOVAL LEFT NECK LYMPH NODE  1996   TONSILLECTOMY  CHILD   TOTAL KNEE ARTHROPLASTY Right 03/12/2017   Procedure: RIGHT TOTAL KNEE ARTHROPLASTY;  Surgeon: Liliane Rei, MD;  Location: [facility];  Service: Orthopedics;  Laterality: Right;  Adductor Block   TRANSURETHRAL RESECTION OF PROSTATE N/A 08/08/2013   Procedure: TRANSURETHRAL RESECTION OF THE PROSTATE WITH GYRUS INSTRUMENTS;  Surgeon: Edmund Gouge, MD;  Location: [facility];  Service: Urology;  Laterality: N/A;   TRANSURETHRAL RESECTION OF PROSTATE     Patient Active  Problem List   Diagnosis Date Noted   Immunization counseling 12/20/2022   Cellulitis 12/20/2022   Medication management 12/20/2022   Adult attention deficit disorder 06/04/2020   Chronic lymphocytic leukemia (HCC) 06/04/2020   Conductive hearing loss, bilateral 06/04/2020   Crohn's ileitis (HCC) 06/04/2020   Disorder of musculoskeletal system 06/04/2020   Erectile dysfunction 06/04/2020   Gastroesophageal reflux disease 06/04/2020   Gout 06/04/2020   History of adenomatous polyp of colon 06/04/2020   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   Hypothyroidism 06/04/2020   Increased frequency of urination 06/04/2020   Male hypogonadism 06/04/2020   Muscle pain 06/04/2020   Nocturia 06/04/2020   Obesity 06/04/2020   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   Osteopenia 06/04/2020   Peripheral neurogenic pain 06/04/2020   Recurrent falls 06/04/2020   Unspecified kyphosis, thoracic region 06/04/2020   Vitamin B12 deficiency 06/04/2020   Vitamin D deficiency 06/04/2020   Gait abnormality 05/17/2020   Ataxia 03/16/2020   Angina pectoris (HCC) 02/13/2019   Pseudophakia of both eyes 08/01/2018   History of total knee arthroplasty 03/29/2017   Stiffness of right knee 03/16/2017   OA (osteoarthritis) of knee 03/12/2017   Tremor, essential 12/03/2015   Essential hypertension 12/09/2014   CAD (coronary artery disease) 12/08/2013   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   Pure hypercholesterolemia 12/08/2013   Benign prostatic hyperplasia 08/08/2013   Essential and other specified forms of tremor 11/25/2012    PCP: Benedetta Bradley, MD  REFERRING PROVIDER: Benedetta Bradley, MD  REFERRING DIAG: R26.89  Balance disorder              R26.81  Gait Instability    M62.81 lower extremity weakness  THERAPY DIAG:  Other abnormalities of gait and mobility  Muscle weakness (generalized)  Other lack of coordination  Rationale for Evaluation and Treatment:  Rehabilitation  ONSET DATE: PT order 03/20/2023  SUBJECTIVE:   SUBJECTIVE STATEMENT: Pt reports completion with Land based intervention.  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   PRECAUTIONS: Fall and Other: R TKA, Hx of CLL   WEIGHT BEARING RESTRICTIONS: No  FALLS:  Has patient fallen in last 6 months? Yes. Number of falls 1 when slipping on ice.  3 now.  06/28/23  LIVING ENVIRONMENT: Lives with: lives with their spouse Lives in: 2 story home with rails Stairs: yes Has following equipment at home:  cane, FWW  OCCUPATION: IT trainer, still works some  PLOF: Independent  PATIENT GOALS:  improve strength, feel more comfortable with walking, not feel that he is going to  fall  OBJECTIVE:  Note: Objective measures were completed at Evaluation unless otherwise noted.  DIAGNOSTIC FINDINGS: N/A                                                                                                                               TREATMENT DATE:   DATE: 08/21/23 Pt seen for aquatic therapy today.  Treatment took place in water  3.5-4.75 ft in depth at the [facility] pool. Temp of water  was 91.  Pt entered/exited the pool via stairs with hand rail.   *walking forward, back and side stepping unsupported *tandem and SLS challenges in 3.6 ft then 3 ft (standing on water  step) supported then unsupported->ue add/abd *FT with vision then VE added RBHB for improved success with challenge standing on water  step->bottom step then 2nd step  *RBHB press for TrA engagement wide stance then staggered x 10 in 3.7-8 ft *core engagement using green hand bells wide stance then staggered sets of 5 slow then 5 fast arm  swings.  Pt requires the buoyancy and hydrostatic pressure of water  for support, and to offload joints by unweighting joint load by at least 50 % in navel deep water  and by at least 75-80% in chest to neck deep water .  Viscosity of the water  is needed for resistance of strengthening. Water  current perturbations provides challenge to standing balance requiring increased core activation.   08/15/2023 PT spent time educating pt concerning HEP on Medbridge[employer] app.  Tandem gait was not showing up as a video on HEP.  PT answered pt's questions concerning HEP.     Tandem gait x 4 laps with rail Box pattern with amb fwd, sidestepping, and amb bwd with SBA and occasional CGA with amb bwd.  Ambulating over 4 hurdles with a reciprocal gait with the rail with SBA/CGA.  PT also added an airex pad at the end during the latter reps.  Sidestepping on airex beams with UE support and CGA  Ambulating around cones and sidestepping around cones with SBA/CGA  Ambulating with head turns with CGA and min assist down hallway Step ups on 6 inch step with rail      PATIENT EDUCATION:  Education details: HEP including the app, relevant anatomy, POC, dx, exercise form, and rationale of interventions. Person educated: Patient Education method: Explanation, demonstration, verbal and tactile cues Education comprehension: verbalized understanding, returned demonstration, verbal and tactile cues required  HOME EXERCISE PROGRAM: Access Code: [email or web address] URL: [email or web address] Date: 04/23/2023 Prepared by: Marnie Siren   ASSESSMENT:  CLINICAL IMPRESSION: Majority of session completed in reduced submersion on water  step and on bottom then 2nd to bottom step to simulate completion at personal pool with future completion of HEP.  He demonstrates improved balance in all positions static and dynamic at onset of session but does fatigue 1/2 way through requiring vc for focus on weight shift/ use of  sensation through feet to control positioning.  Pt is able to hold tandem stance  in 2.6 ft x 20sec. Goals ongoing     OBJECTIVE IMPAIRMENTS: Abnormal gait, decreased activity tolerance, decreased balance, decreased coordination, decreased endurance, decreased mobility, difficulty walking, decreased strength, and postural dysfunction.   ACTIVITY LIMITATIONS: carrying, lifting, standing, squatting, and locomotion level  PARTICIPATION LIMITATIONS: shopping and community activity  PERSONAL FACTORS: Time since onset of injury/illness/exacerbation and 3+ comorbidities: FXAS, R TKA, peripheral neuropathy, CLL are also affecting patient's functional outcome.   REHAB POTENTIAL: Good  CLINICAL DECISION MAKING: Evolving/moderate complexity  EVALUATION COMPLEXITY: Moderate   GOALS:  SHORT TERM GOALS: Target date: 05/11/2023  Pt will be independent with HEP for improved balance, strength, and functional mobility. Baseline: Goal status: GOAL MET  07/20/23  2.  Pt will demo improved stability with turning with gait.  Baseline:  Goal status: GOAL MET  07/20/23  3.  Pt will perform TUG safely including turning and and performing stand to sit transfer with good control and without assistance.  Baseline:  Goal status: GOAL MET  07/20/23  4.  Pt will be able to perform tandem stance for at least 20 seconds for improved balance and stability with daily activities.  Baseline:  Goal status: in progress 05/24/23 Target date:  05/18/2023   LONG TERM GOALS: Target date: 08/31/2023   Pt will demo improved time on 5x STS test by 3 seconds and also improved form by not using his legs on the back of the chair for improved functional LE strength and performance of transfers.  Baseline:  Goal status: GOAL MET  5/16  2.  Pt will demo improved R hip flexion strength to 5/5 MMT and bilat hip abd strength by at least 6-8 lbs for improved strength to perform functional mobility with increased ease and less  difficulty.  Baseline:  Goal status: 66% MET   3.  (Will set Berg goal next visit after testing. )Pt will improve on Berg balance test to >/= 36/56 to demonstrate a decrease in fall risk. Baseline: 26/56 06/05/23 Goal status: in progress 06/05/23  4.  Pt will report at least a 70% improvement in stability with gait and community ambulation.  Baseline:  Goal status: PROGRESSING THOUGH NOT MET  07/20/23  5.  Pt will report improved confidence with his daily functional mobility.  Baseline:  Goal status: PARTIALLY MET  5/16     PLAN:  PT FREQUENCY: 2x/week  PT DURATION:  4-6 weeks  PLANNED INTERVENTIONS: 97164- PT Re-evaluation, 97110-Therapeutic exercises, 97530- Therapeutic activity, 97112- Neuromuscular re-education, 97535- Self Care, 81191- Manual therapy, [phone]- Gait training, [phone]- Aquatic Therapy, Patient/Family education, Balance training, Stair training, and Taping  PLAN FOR NEXT SESSION: Cont with Balance and Gait training and strengthening.  Cont with aquatic therapy.   Adriana Hopping [place]) Lomax Poehler MPT 08/21/23 8:46 AM [facility] [address] [place], [place], [address] Phone: [phone]   Fax:  [phone]

## 2023-08-28 ENCOUNTER — Ambulatory Visit (HOSPITAL_BASED_OUTPATIENT_CLINIC_OR_DEPARTMENT_OTHER): Admitting: Physical Therapy

## 2023-08-28 ENCOUNTER — Encounter (HOSPITAL_BASED_OUTPATIENT_CLINIC_OR_DEPARTMENT_OTHER): Payer: Self-pay | Admitting: Physical Therapy

## 2023-08-28 DIAGNOSIS — R2689 Other abnormalities of gait and mobility: Secondary | ICD-10-CM | POA: Diagnosis not present

## 2023-08-28 DIAGNOSIS — M6281 Muscle weakness (generalized): Secondary | ICD-10-CM | POA: Diagnosis not present

## 2023-08-28 DIAGNOSIS — K8020 Calculus of gallbladder without cholecystitis without obstruction: Secondary | ICD-10-CM | POA: Diagnosis not present

## 2023-08-28 DIAGNOSIS — R310 Gross hematuria: Secondary | ICD-10-CM | POA: Diagnosis not present

## 2023-08-28 DIAGNOSIS — K5730 Diverticulosis of large intestine without perforation or abscess without bleeding: Secondary | ICD-10-CM | POA: Diagnosis not present

## 2023-08-28 DIAGNOSIS — R278 Other lack of coordination: Secondary | ICD-10-CM | POA: Diagnosis not present

## 2023-08-28 DIAGNOSIS — N400 Enlarged prostate without lower urinary tract symptoms: Secondary | ICD-10-CM | POA: Diagnosis not present

## 2023-08-28 DIAGNOSIS — N200 Calculus of kidney: Secondary | ICD-10-CM | POA: Diagnosis not present

## 2023-08-28 NOTE — Therapy (Signed)
 OUTPATIENT PHYSICAL THERAPY LOWER EXTREMITY TREATMENT  PHYSICAL THERAPY DISCHARGE SUMMARY  Visits from Start of Care: 27  Current functional level related to goals / functional outcomes: indep   Remaining deficits: Chronic conditions   Education / Equipment: Management of condition/HEP   Patient agrees to discharge. Patient goals were all met except one which her partially met. Patient is being discharged due to being pleased with the current functional level.     Patient Name: Cole Martinez MRN: [id number] DOB:[birth year], 78 y.o., male Today's Date: 08/28/2023  END OF SESSION:  PT End of Session - 08/28/23 1117     Visit Number 27    Number of Visits 28    Date for PT Re-Evaluation 08/31/23    Authorization Type MCR A and B    PT Start Time 0847    PT Stop Time 0925    PT Time Calculation (min) 38 min    Activity Tolerance Patient tolerated treatment well    Behavior During Therapy WFL for tasks assessed/performed                  Past Medical History:  Diagnosis Date   ADHD (attention deficit hyperactivity disorder)    B12 deficiency    BPH (benign prostatic hypertrophy)    Chronic lymphocytic leukemia (CLL), T-cell (HCC) DX 1996--  ONCOLOGIST-  DR CLORETTA   PT IS ASYMPTOMATIC--- LAST CBC W/ DIFF 06-24-2012 STABLE   Coronary artery disease CARDIOLOGIST- DR VICTORY SHARPS   S/P STENTING LAD 1999 // Myoview  01/2019: EF 62, normal perfusion; Low Risk   Crohn's disease of ileum (HCC) SINCE 1988   ED (erectile dysfunction)    Elevated PSA    Essential and other specified forms of tremor 11/25/2012   Gait abnormality 05/17/2020   H/O adenomatous polyp of colon    Hyperlipidemia    Hypertension    Nocturia    OA (osteoarthritis)    Peripheral neuropathy    hx of, none current as of 08-04-13   Peyronie disease    S/P coronary artery stent placement OCT 1999 OF LAD   Tremor, hereditary, benign MILD RIGHT HAND   Past Surgical History:  Procedure  Laterality Date   CARDIOVASCULAR STRESS TEST  11-01-2010 DR VICTORY SHARPS   NORMAL PERFUSION STUDY/ EF 64%/ NO ISCHEMIA   cataract surgery  Bilateral feburary 2020   with lens placement    COLONOSCOPY WITH PROPOFOL  N/A 09/24/2012   Procedure: COLONOSCOPY WITH PROPOFOL ;  Surgeon: Gladis MARLA Louder, MD;  Location: [facility];  Service: Endoscopy;  Laterality: N/A;   CORONARY ANGIOPLASTY WITH STENT PLACEMENT  OCT 1999   STENT OF LAD   CORONARY STENT INTERVENTION N/A 02/14/2019   Procedure: CORONARY STENT INTERVENTION;  Surgeon: SHARPS VICTORY ORN, MD;  Location: [facility];  Service: Cardiovascular;  Laterality: N/A;   CYSTOSCOPY WITH URETHRAL DILATATION  03/12/2017   Procedure: CYSTOSCOPY WITH URETHRAL DILATATION;  Surgeon: Melodi Lerner, MD;  Location: [facility];  Service: Orthopedics;;  MYRTIS Bidding, Resident Assisting   CYSTOSCOPY WITH URETHRAL DILATATION N/A 10/03/2018   Procedure: CYSTOSCOPY WITH URETHRAL BALLOON DILATATION WITH BILATERAL RETROGRADE PYELOGRAPHY;  Surgeon: Renda Glance, MD;  Location: [facility];  Service: Urology;  Laterality: N/A;   CYSTOSCOPY WITH URETHRAL DILATATION N/A 12/30/2020   Procedure: CYSTOSCOPY WITH BALLOON DILATATION OF URETHRAL STRICTURE;  Surgeon: Renda Glance, MD;  Location: [facility];  Service: Urology;  Laterality: N/A;   LAPAROSCOPIC INGUINAL HERNIA REPAIR Bilateral 01-10-2004   W/ MESH   LEFT  HEART CATH AND CORONARY ANGIOGRAPHY N/A 02/13/2019   Procedure: LEFT HEART CATH AND CORONARY ANGIOGRAPHY;  Surgeon: Claudene Victory ORN, MD;  Location: [facility];  Service: Cardiovascular;  Laterality: N/A;   neck benign removed from neck  yrs ago   PROSTATE BIOPSY N/A 07/12/2012   Procedure: PROSTATE BIOPSY AND ULTRASOUND;  Surgeon: Arlena LILLETTE Gal, MD;  Location: [facility];  Service: Urology;  Laterality: N/A;   PROSTATE SURGERY  2002   tuna   REMOVAL LEFT NECK LYMPH NODE  1996   TONSILLECTOMY  CHILD   TOTAL KNEE ARTHROPLASTY Right 03/12/2017    Procedure: RIGHT TOTAL KNEE ARTHROPLASTY;  Surgeon: Melodi Lerner, MD;  Location: [facility];  Service: Orthopedics;  Laterality: Right;  Adductor Block   TRANSURETHRAL RESECTION OF PROSTATE N/A 08/08/2013   Procedure: TRANSURETHRAL RESECTION OF THE PROSTATE WITH GYRUS INSTRUMENTS;  Surgeon: Arlena LILLETTE Gal, MD;  Location: [facility];  Service: Urology;  Laterality: N/A;   TRANSURETHRAL RESECTION OF PROSTATE     Patient Active Problem List   Diagnosis Date Noted   Immunization counseling 12/20/2022   Cellulitis 12/20/2022   Medication management 12/20/2022   Adult attention deficit disorder 06/04/2020   Chronic lymphocytic leukemia (HCC) 06/04/2020   Conductive hearing loss, bilateral 06/04/2020   Crohn's ileitis (HCC) 06/04/2020   Disorder of musculoskeletal system 06/04/2020   Erectile dysfunction 06/04/2020   Gastroesophageal reflux disease 06/04/2020   Gout 06/04/2020   History of adenomatous polyp of colon 06/04/2020   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   Hypothyroidism 06/04/2020   Increased frequency of urination 06/04/2020   Male hypogonadism 06/04/2020   Muscle pain 06/04/2020   Nocturia 06/04/2020   Obesity 06/04/2020   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   Osteopenia 06/04/2020   Peripheral neurogenic pain 06/04/2020   Recurrent falls 06/04/2020   Unspecified kyphosis, thoracic region 06/04/2020   Vitamin B12 deficiency 06/04/2020   Vitamin D deficiency 06/04/2020   Gait abnormality 05/17/2020   Ataxia 03/16/2020   Angina pectoris (HCC) 02/13/2019   Pseudophakia of both eyes 08/01/2018   History of total knee arthroplasty 03/29/2017   Stiffness of right knee 03/16/2017   OA (osteoarthritis) of knee 03/12/2017   Tremor, essential 12/03/2015   Essential hypertension 12/09/2014   CAD (coronary artery disease) 12/08/2013   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   Pure hypercholesterolemia 12/08/2013   Benign prostatic hyperplasia 08/08/2013    Essential and other specified forms of tremor 11/25/2012    PCP: Charlott Dorn LABOR, MD  REFERRING PROVIDER: Charlott Dorn LABOR, MD  REFERRING DIAG: R26.89  Balance disorder              R26.81  Gait Instability    M62.81 lower extremity weakness  THERAPY DIAG:  Other abnormalities of gait and mobility  Muscle weakness (generalized)  Other lack of coordination  Rationale for Evaluation and Treatment: Rehabilitation  ONSET DATE: PT order 03/20/2023  SUBJECTIVE:   SUBJECTIVE STATEMENT: Pt ready for final HEP and DC.  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   PRECAUTIONS: Fall and Other: R TKA, Hx of CLL   WEIGHT BEARING RESTRICTIONS: No  FALLS:  Has patient fallen in last 6 months? Yes.  Number of falls 1 when slipping on ice.  3 now.  06/28/23  LIVING ENVIRONMENT: Lives with: lives with their spouse Lives in: 2 story home with rails Stairs: yes Has following equipment at home:  cane, FWW  OCCUPATION: IT trainer, still works some  PLOF: Independent  PATIENT GOALS:  improve strength, feel more comfortable with walking, not feel that he is going to fall  OBJECTIVE:  Note: Objective measures were completed at Evaluation unless otherwise noted.  DIAGNOSTIC FINDINGS: N/A                                                                                                                               TREATMENT DATE:   DATE: 08/28/23 Pt seen for aquatic therapy today.  Treatment took place in water  3.5-4.75 ft in depth at the [facility] pool. Temp of water  was 91.  Pt entered/exited the pool via stairs with hand rail.   Exercises - Hand buoy carry: Forward and Backward; bilaterally->unilaterally    - Tandem Stance: supported by RBHB, unsupported, ue add/abd; VE - Tandem Walking forward and back ue support RBHB - Single Leg Stance: supported by RBHB, unsupported, ue add/abd; VE - Noodle press  - Sidestepping stepping over box  Pt requires the buoyancy and hydrostatic pressure of water  for support, and to offload joints by unweighting joint load by at least 50 % in navel deep water  and by at least 75-80% in chest to neck deep water .  Viscosity of the water  is needed for resistance of strengthening. Water  current perturbations provides challenge to standing balance requiring increased core activation.  4. able to stand without using hands and stabilize independently  4. able to stand safely for 2 minutes  4. able to sit safely and securely for 2 minutes   4. sits safely with minimal use of hands  4. able to transfer safely with minor use of hands  4. able to stand 10 seconds safely  3. able to place feet together independently and stand 1 minute with supervision  4. can reach forward confidently 25 cm (10 inches)    4. able to pick up slipper safely and easily  2. turns sideways but only maintains balance    2. able to turn 360 degrees safely but slowly  3. able to stand independently and complete 8 steps in > 20 seconds     1. needs help to step but can hold 15 seconds   2. able to lift leg independently and hold >= 3 seconds    45/56   08/15/2023 PT spent time educating pt concerning HEP on [employer] app.  Tandem gait was not showing up as a video on HEP.  PT answered pt's questions concerning HEP.     Tandem gait x 4 laps with rail Box pattern with amb fwd, sidestepping, and amb bwd with SBA and occasional CGA with amb bwd.  Ambulating over 4 hurdles with a reciprocal gait with the  rail with SBA/CGA.  PT also added an airex pad at the end during the latter reps.  Sidestepping on airex beams with UE support and CGA  Ambulating around cones and sidestepping around  cones with SBA/CGA  Ambulating with head turns with CGA and min assist down hallway Step ups on 6 inch step with rail      PATIENT EDUCATION:  Education details: HEP including the app, relevant anatomy, POC, dx, exercise form, and rationale of interventions. Person educated: Patient Education method: Explanation, demonstration, verbal and tactile cues Education comprehension: verbalized understanding, returned demonstration, verbal and tactile cues required  HOME EXERCISE PROGRAM: Access Code: [email or web address] URL: [email or web address] Date: 04/23/2023 Prepared by: Mose Minerva  Aquatic This aquatic home exercise program from [employer] utilizes pictures from land based exercises, but has been adapted prior to lamination and issuance.   Access Code: [email or web address] URL: [email or web address] Date: 08/28/2023 Prepared by: Frankie Bhakti Labella  Exercises - Hand buoy carry: Forward and Backward; bilaterally->unilaterally  - 1 x daily - 1-3 x weekly - Tandem Stance  - 1 x daily - 1-3 x weekly - 20 hold - Tandem Walking  - 1 x daily - 1-3 x weekly - Single Leg Stance  - 1-3 x weekly - 20 hold - Noodle press  - 1 x daily - 1-3 x weekly - 1-3 sets - 10 reps - Sidestepping  - 1 x daily - 1-3 x weekly - 3 sets - 10 reps  ASSESSMENT:  CLINICAL IMPRESSION: Pt has met STG #4 in aquatics but not on land. He reports increased confidence with amb in community using cane as needed.  He has had no recent falls.  Pt has reached his max potential in aquatic and land based setting.  He has been instructed and issued both Hep and demonstrates as well as reports indep. He has pool access at personal gym and attends 3 x weekly.  Today he is issued final aquatic HEP. Pt ready for and agrees with DC.     OBJECTIVE IMPAIRMENTS: Abnormal gait, decreased activity tolerance, decreased balance, decreased coordination, decreased endurance, decreased mobility, difficulty walking, decreased  strength, and postural dysfunction.   ACTIVITY LIMITATIONS: carrying, lifting, standing, squatting, and locomotion level  PARTICIPATION LIMITATIONS: shopping and community activity  PERSONAL FACTORS: Time since onset of injury/illness/exacerbation and 3+ comorbidities: FXAS, R TKA, peripheral neuropathy, CLL are also affecting patient's functional outcome.   REHAB POTENTIAL: Good  CLINICAL DECISION MAKING: Evolving/moderate complexity  EVALUATION COMPLEXITY: Moderate   GOALS:  SHORT TERM GOALS: Target date: 05/11/2023  Pt will be independent with HEP for improved balance, strength, and functional mobility. Baseline: Goal status: GOAL MET  07/20/23  2.  Pt will demo improved stability with turning with gait.  Baseline:  Goal status: GOAL MET  07/20/23  3.  Pt will perform TUG safely including turning and and performing stand to sit transfer with good control and without assistance.  Baseline:  Goal status: GOAL MET  07/20/23  4.  Pt will be able to perform tandem stance for at least 20 seconds for improved balance and stability with daily activities.  Baseline:  Goal status: in progress 05/24/23; Partially Met (in pool)08/28/23 Target date:  05/18/2023   LONG TERM GOALS: Target date: 08/31/2023   Pt will demo improved time on 5x STS test by 3 seconds and also improved form by not using his legs on the back of the chair for improved functional LE strength and performance of transfers.  Baseline:  Goal status: GOAL MET  5/16  2.  Pt will demo improved R hip flexion strength to 5/5 MMT and bilat hip abd strength by at least 6-8 lbs for improved strength to perform functional mobility with increased ease and less difficulty.  Baseline:  Goal status: 66% MET   3.  (Will set Berg goal next visit after testing. )Pt will improve on Berg balance test to >/= 36/56 to demonstrate a decrease in fall risk. Baseline: 26/56 06/05/23  45/56 Goal status: in progress 06/05/23; Met 08/28/23  4.  Pt  will report at least a 70% improvement in stability with gait and community ambulation.  Baseline:  Goal status: PROGRESSING THOUGH NOT MET  07/20/23; Met 08/28/23  5.  Pt will report improved confidence with his daily functional mobility.  Baseline:  Goal status: PARTIALLY MET  5/16     PLAN:  PT FREQUENCY: 2x/week  PT DURATION:  4-6 weeks  PLANNED INTERVENTIONS: 97164- PT Re-evaluation, 97110-Therapeutic exercises, 97530- Therapeutic activity, 97112- Neuromuscular re-education, 97535- Self Care, 02859- Manual therapy, [phone]- Gait training, [phone]- Aquatic Therapy, Patient/Family education, Balance training, Stair training, and Taping  PLAN FOR NEXT SESSION: Cont with Balance and Gait training and strengthening.  Cont with aquatic therapy.   [address] [place]) Teofil Maniaci MPT 08/28/23 11:18 AM [facility] [address] [place], [place], [address] Phone: [phone]   Fax:  [phone]

## 2023-08-30 ENCOUNTER — Ambulatory Visit (HOSPITAL_BASED_OUTPATIENT_CLINIC_OR_DEPARTMENT_OTHER): Admitting: Physical Therapy

## 2023-08-30 ENCOUNTER — Encounter (HOSPITAL_BASED_OUTPATIENT_CLINIC_OR_DEPARTMENT_OTHER): Payer: Self-pay

## 2023-08-31 ENCOUNTER — Encounter: Payer: Self-pay | Admitting: Dietician

## 2023-08-31 ENCOUNTER — Encounter: Attending: Internal Medicine | Admitting: Dietician

## 2023-08-31 DIAGNOSIS — R7303 Prediabetes: Secondary | ICD-10-CM | POA: Insufficient documentation

## 2023-08-31 NOTE — Progress Notes (Signed)
 Patient was seen on 08/31/2023 for the Core Session 15 of Diabetes Prevention Program course at [facility]. By the end of this session patients are able to complete the following objectives:   Learning Objectives: Explain how to prevent stress or cope with unavoidable stress.  Describe how this program can be a source of stress.  Explain how to manage stressful situations.  Create and follow an action plan for either preventing or coping with a stressful situation.   Goals:  Record weight taken outside of class.  Track foods and beverages eaten each day in the Food and Activity Tracker, including calories and fat grams for each item.   Track activity type, minutes you were active, and distance you reached each day in the Food and Activity Tracker.  Do your best to reach activity goal for the week. Follow your action plan to reduce stress.  Answer questions on handout regarding success of action plan.   Follow-Up Plan: Attend Core Session 16 next week.  Bring completed Food and Activity Tracker next week to be reviewed by Lifestyle Coach.

## 2023-09-03 DIAGNOSIS — E7800 Pure hypercholesterolemia, unspecified: Secondary | ICD-10-CM | POA: Diagnosis not present

## 2023-09-03 DIAGNOSIS — E669 Obesity, unspecified: Secondary | ICD-10-CM | POA: Diagnosis not present

## 2023-09-03 DIAGNOSIS — I2510 Atherosclerotic heart disease of native coronary artery without angina pectoris: Secondary | ICD-10-CM | POA: Diagnosis not present

## 2023-09-11 DIAGNOSIS — N401 Enlarged prostate with lower urinary tract symptoms: Secondary | ICD-10-CM | POA: Diagnosis not present

## 2023-09-11 DIAGNOSIS — R310 Gross hematuria: Secondary | ICD-10-CM | POA: Diagnosis not present

## 2023-09-11 DIAGNOSIS — R350 Frequency of micturition: Secondary | ICD-10-CM | POA: Diagnosis not present

## 2023-09-11 DIAGNOSIS — N35011 Post-traumatic bulbous urethral stricture: Secondary | ICD-10-CM | POA: Diagnosis not present

## 2023-09-12 DIAGNOSIS — J069 Acute upper respiratory infection, unspecified: Secondary | ICD-10-CM | POA: Diagnosis not present

## 2023-09-14 DIAGNOSIS — Z461 Encounter for fitting and adjustment of hearing aid: Secondary | ICD-10-CM | POA: Diagnosis not present

## 2023-09-14 DIAGNOSIS — H903 Sensorineural hearing loss, bilateral: Secondary | ICD-10-CM | POA: Diagnosis not present

## 2023-09-14 DIAGNOSIS — Z974 Presence of external hearing-aid: Secondary | ICD-10-CM | POA: Diagnosis not present

## 2023-09-21 ENCOUNTER — Encounter: Attending: Internal Medicine | Admitting: Dietician

## 2023-09-21 ENCOUNTER — Encounter: Payer: Self-pay | Admitting: Dietician

## 2023-09-21 DIAGNOSIS — R7303 Prediabetes: Secondary | ICD-10-CM | POA: Insufficient documentation

## 2023-09-21 NOTE — Progress Notes (Signed)
 Patient was seen on 09/21/2023 for the Core Session 16 of Diabetes Prevention Program course at [facility]. By the end of this session patients are able to complete the following objectives:   Learning Objectives: Measure their progress toward weight and physical activity goals since Session 1.  Develop a plan for improving progress, if their goals have not yet been attained.  Describe ways to stay motivated long-term.   Goals:  Record weight taken outside of class.  Track foods and beverages eaten each day in the Food and Activity Tracker, including calories and fat grams for each item.   Track activity type, minutes you were active, and distance you reached each day in the Food and Activity Tracker.  Utilize action plan to help stay motivated and complete questions on To Do List.   Follow-Up Plan: Attend session 17 in two weeks.  Bring completed Food and Activity Tracker next session to be reviewed by Lifestyle Coach.

## 2023-10-04 DIAGNOSIS — E669 Obesity, unspecified: Secondary | ICD-10-CM | POA: Diagnosis not present

## 2023-10-04 DIAGNOSIS — I2510 Atherosclerotic heart disease of native coronary artery without angina pectoris: Secondary | ICD-10-CM | POA: Diagnosis not present

## 2023-10-04 DIAGNOSIS — E7800 Pure hypercholesterolemia, unspecified: Secondary | ICD-10-CM | POA: Diagnosis not present

## 2023-10-05 DIAGNOSIS — I2510 Atherosclerotic heart disease of native coronary artery without angina pectoris: Secondary | ICD-10-CM | POA: Diagnosis not present

## 2023-10-12 ENCOUNTER — Encounter: Payer: Self-pay | Admitting: Dietician

## 2023-10-12 ENCOUNTER — Encounter: Attending: Internal Medicine | Admitting: Dietician

## 2023-10-12 DIAGNOSIS — R7303 Prediabetes: Secondary | ICD-10-CM | POA: Insufficient documentation

## 2023-10-12 NOTE — Progress Notes (Signed)
 Patient was seen on 10/12/2023 for Session 17 of Diabetes Prevention Program course at [facility]. By the end of this session patients are able to complete the following objectives:   Learning Objectives: Identify how to maintain and/or continue working toward program goals for the remainder of the program.  Describe ways that food and activity tracking can assist them in maintaining/reaching program goals.  Identify progress they have made since the beginning of the program.   Goals:  Record weight taken outside of class.  Track foods and beverages eaten each day in the Food and Activity Tracker, including calories and fat grams for each item.   Track activity type, minutes you were active, and distance you reached each day in the Food and Activity Tracker.   Follow-Up Plan: Attend session 18 in two weeks.  Bring completed Food and Activity Trackers next session to be reviewed by Lifestyle Coach.

## 2023-10-31 DIAGNOSIS — G118 Other hereditary ataxias: Secondary | ICD-10-CM | POA: Diagnosis not present

## 2023-10-31 DIAGNOSIS — Z79899 Other long term (current) drug therapy: Secondary | ICD-10-CM | POA: Diagnosis not present

## 2023-10-31 DIAGNOSIS — R270 Ataxia, unspecified: Secondary | ICD-10-CM | POA: Diagnosis not present

## 2023-10-31 DIAGNOSIS — Z1589 Genetic susceptibility to other disease: Secondary | ICD-10-CM | POA: Diagnosis not present

## 2023-10-31 DIAGNOSIS — G250 Essential tremor: Secondary | ICD-10-CM | POA: Diagnosis not present

## 2023-10-31 DIAGNOSIS — Q992 Fragile X chromosome: Secondary | ICD-10-CM | POA: Diagnosis not present

## 2023-10-31 DIAGNOSIS — R419 Unspecified symptoms and signs involving cognitive functions and awareness: Secondary | ICD-10-CM | POA: Diagnosis not present

## 2023-11-04 DIAGNOSIS — E669 Obesity, unspecified: Secondary | ICD-10-CM | POA: Diagnosis not present

## 2023-11-04 DIAGNOSIS — I2510 Atherosclerotic heart disease of native coronary artery without angina pectoris: Secondary | ICD-10-CM | POA: Diagnosis not present

## 2023-11-04 DIAGNOSIS — E7800 Pure hypercholesterolemia, unspecified: Secondary | ICD-10-CM | POA: Diagnosis not present

## 2023-11-06 ENCOUNTER — Encounter: Payer: Self-pay | Admitting: Internal Medicine

## 2023-11-07 DIAGNOSIS — E230 Hypopituitarism: Secondary | ICD-10-CM | POA: Diagnosis not present

## 2023-11-07 DIAGNOSIS — N400 Enlarged prostate without lower urinary tract symptoms: Secondary | ICD-10-CM | POA: Diagnosis not present

## 2023-11-07 DIAGNOSIS — L989 Disorder of the skin and subcutaneous tissue, unspecified: Secondary | ICD-10-CM | POA: Diagnosis not present

## 2023-11-07 DIAGNOSIS — F900 Attention-deficit hyperactivity disorder, predominantly inattentive type: Secondary | ICD-10-CM | POA: Diagnosis not present

## 2023-11-07 DIAGNOSIS — Q992 Fragile X chromosome: Secondary | ICD-10-CM | POA: Diagnosis not present

## 2023-11-07 DIAGNOSIS — R350 Frequency of micturition: Secondary | ICD-10-CM | POA: Diagnosis not present

## 2023-11-07 DIAGNOSIS — C9110 Chronic lymphocytic leukemia of B-cell type not having achieved remission: Secondary | ICD-10-CM | POA: Diagnosis not present

## 2023-11-07 DIAGNOSIS — R5383 Other fatigue: Secondary | ICD-10-CM | POA: Diagnosis not present

## 2023-11-07 DIAGNOSIS — R7303 Prediabetes: Secondary | ICD-10-CM | POA: Diagnosis not present

## 2023-11-07 DIAGNOSIS — I2510 Atherosclerotic heart disease of native coronary artery without angina pectoris: Secondary | ICD-10-CM | POA: Diagnosis not present

## 2023-11-07 DIAGNOSIS — Z125 Encounter for screening for malignant neoplasm of prostate: Secondary | ICD-10-CM | POA: Diagnosis not present

## 2023-11-07 DIAGNOSIS — G629 Polyneuropathy, unspecified: Secondary | ICD-10-CM | POA: Diagnosis not present

## 2023-11-07 DIAGNOSIS — J310 Chronic rhinitis: Secondary | ICD-10-CM | POA: Diagnosis not present

## 2023-11-07 DIAGNOSIS — G119 Hereditary ataxia, unspecified: Secondary | ICD-10-CM | POA: Diagnosis not present

## 2023-11-10 DIAGNOSIS — I2510 Atherosclerotic heart disease of native coronary artery without angina pectoris: Secondary | ICD-10-CM | POA: Diagnosis not present

## 2023-11-12 DIAGNOSIS — L570 Actinic keratosis: Secondary | ICD-10-CM | POA: Diagnosis not present

## 2023-11-12 DIAGNOSIS — D044 Carcinoma in situ of skin of scalp and neck: Secondary | ICD-10-CM | POA: Diagnosis not present

## 2023-11-12 DIAGNOSIS — D485 Neoplasm of uncertain behavior of skin: Secondary | ICD-10-CM | POA: Diagnosis not present

## 2023-11-13 MED ORDER — PROPRANOLOL HCL 10 MG PO TABS: 10.0000 mg | ORAL_TABLET | Freq: Two times a day (BID) | ORAL | 3 refills | Status: DC

## 2023-11-23 ENCOUNTER — Encounter: Payer: Self-pay | Admitting: Dietician

## 2023-11-23 ENCOUNTER — Encounter: Attending: Internal Medicine | Admitting: Dietician

## 2023-11-23 DIAGNOSIS — R7303 Prediabetes: Secondary | ICD-10-CM | POA: Insufficient documentation

## 2023-11-23 NOTE — Progress Notes (Signed)
 Patient was seen on 11/23/2023 for a post core session of Diabetes Prevention Program course at [facility]. By the end of this session patients are able to complete the following objectives:   Learning Objectives: Explain how glucose is used in the body and it's relationship with insulin/insulin resistance.  Identify symptoms of diabetes.  Describe lab tests used to diagnose diabetes.  Describe health complications and conditions related to diabetes.   Goals:  Record weight taken outside of class.  Track foods and beverages eaten each day in the Food and Activity Tracker, including calories and fat grams for each item.   Track activity type, minutes you were active, and distance you reached each day in the Food and Activity Tracker.   Follow-Up Plan: Attend session 19 in two weeks.  Bring completed Food and Activity Trackers to next session to be reviewed by Lifestyle Coach.

## 2023-12-03 DIAGNOSIS — C4442 Squamous cell carcinoma of skin of scalp and neck: Secondary | ICD-10-CM | POA: Diagnosis not present

## 2023-12-04 DIAGNOSIS — R7303 Prediabetes: Secondary | ICD-10-CM | POA: Diagnosis not present

## 2023-12-04 DIAGNOSIS — E7800 Pure hypercholesterolemia, unspecified: Secondary | ICD-10-CM | POA: Diagnosis not present

## 2023-12-04 DIAGNOSIS — I209 Angina pectoris, unspecified: Secondary | ICD-10-CM | POA: Diagnosis not present

## 2023-12-04 DIAGNOSIS — I2510 Atherosclerotic heart disease of native coronary artery without angina pectoris: Secondary | ICD-10-CM | POA: Diagnosis not present

## 2023-12-04 DIAGNOSIS — E669 Obesity, unspecified: Secondary | ICD-10-CM | POA: Diagnosis not present

## 2023-12-04 DIAGNOSIS — E230 Hypopituitarism: Secondary | ICD-10-CM | POA: Diagnosis not present

## 2023-12-04 DIAGNOSIS — M8580 Other specified disorders of bone density and structure, unspecified site: Secondary | ICD-10-CM | POA: Diagnosis not present

## 2023-12-04 DIAGNOSIS — R6882 Decreased libido: Secondary | ICD-10-CM | POA: Diagnosis not present

## 2023-12-06 ENCOUNTER — Telehealth: Payer: Self-pay | Admitting: Internal Medicine

## 2023-12-06 NOTE — Telephone Encounter (Signed)
 Left message for patient to call back

## 2023-12-06 NOTE — Telephone Encounter (Signed)
  Per MyChart scheduling message:  Pt c/o of Chest Pain: STAT if active (IN THIS MOMENT) CP, including tightness, pressure, jaw pain, shoulder/upper arm/back pain, SOB, nausea, and vomiting.  1. Are you having CP right now (tightness, pressure, or discomfort)?   2. Are you experiencing any other symptoms (ex. SOB, nausea, vomiting, sweating)?   3. How long have you been experiencing CP?   4. Is your CP continuous or coming and going?   5. Have you taken Nitroglycerin ?   6. If CP returns before callback, please consider calling 911. ?   1. Not right now 2. Sweating  3.since last Saturday  4. Comes and Goes  5.  No

## 2023-12-10 DIAGNOSIS — I2510 Atherosclerotic heart disease of native coronary artery without angina pectoris: Secondary | ICD-10-CM | POA: Diagnosis not present

## 2023-12-10 NOTE — Telephone Encounter (Signed)
 Spoke with pt reports had an episode of chest pain/discomfort one day last week while walking.  Pt expresses doesn't remember what day event occurred.   Pt reports at time of event would say pain was a 3:10.  Lasted a few minutes stopped once stopped walking.  Denies left arm and shoulder pain.  Does report became sweaty at time of event.  Reports has intermittent chest discomfort at night that is mild.   Reports checks BP daily but automatically goes to PCP office with [facility].   BP today was 140/68 HR 60's.  Pt expresses will call PCP to have readings faxed to our office. Reports rode bike at gym today felt fine didn't have any chest discomfort.  Pt reports would like to see Dr. Wendel.  OV scheduled for 12/18/23 at 2:40 pm.  Advised pt if CP occurs again to go to ED for evaluation.

## 2023-12-10 NOTE — Telephone Encounter (Signed)
  Per MyChart scheduling message:  Patient is asking for call back

## 2023-12-10 NOTE — Telephone Encounter (Signed)
 Left message for pt to call.

## 2023-12-10 NOTE — Telephone Encounter (Signed)
 Pt returning nurse call

## 2023-12-12 NOTE — Progress Notes (Signed)
 Cardiology Office Note:   Date:  12/18/2023  ID:  Cole Martinez Mas, DOB 06-Jan-1946, MRN [id number] PCP:  Charlott Dorn LABOR, MD  [facility] Providers Cardiologist:  Wendel Haws, MD Referring MD: Charlott Dorn LABOR, *  Chief Complaint/Reason for Referral: CAD ASSESSMENT:    1. Coronary artery disease of native artery of native heart with stable angina pectoris   2. Hyperlipidemia LDL goal <70   3. Aortic atherosclerosis   4. Essential hypertension   5. BMI 27.0-27.9,adult     PLAN:   In order of problems listed above: Angina pectoris: Start Imdur 15 mg at bedtime, continue aspirin  81 mg discontinue propranolol  10 mg twice daily, start Toprol  25mg  at bedtime.  Start as needed nitroglycerin .  If not improved, will refer for coronary angiography and possible PCI.  Patient will let us  know how he is doing next week and if he continues to have anginal symptoms we will pursue coronary angiography and possible PCI Hyperlipidemia: Continue rosuvastatin  20 mg.  Check lipid panel, LFTs today; defer LP(a). Aortic atherosclerosis: Continue aspirin  81 mg, rosuvastatin  20 mg Hypertension: BP controlled.  Will start Toprol  as above and discontinue propranolol  for anginal relief. Elevated BMI: Diet and exercise modification:            Dispo:  Return in about 3 months (around 03/19/2024).       I spent 35 minutes reviewing all clinical data during and prior to this visit including all relevant imaging studies, laboratories, clinical information from other health systems and prior notes from both Cardiology and other specialties, interviewing the patient, conducting a complete physical examination, and coordinating care in order to formulate a comprehensive and personalized evaluation and treatment plan.   History of Present Illness:    FOCUSED PROBLEM LIST:   Coronary artery disease  S/p BMS to LAD 1999 Myoview  10/18: no ischemia, EF 57 Myoview  01/24/2019: EF 62, normal  perfusion, low risk LHC 02/13/2019: LM patent, LAD stent 70-80 ISR, Dx ostial 85 (jailed); LCx patent; RCA 90-95, EF 45-50 >> CABG vs PCI - pt opted for PCI Cath 02/14/2019  S/p 2.75 x 15 mm DES pRCA and LAD BMS ISR 50-70>> med Rx TTE 04/07/2022: EF 55-60, no RWMA, mild LVH, GR 1 DD, normal RVSF, mild LAE, trivial MR, mild AI, AV sclerosis, aortic root 39 mm PET PET stress 2025 low risk Hyperlipidemia  Aortic atherosclerosis PET stress test 2025 Hypertension  BMI 27 S/p R TKR 03/2017  Chronic Lymphocytic Leukemia  BPH  Crohn's Dz  Essential Tremor  AAA US  07/2011: no AAA Fragile X assoc tremor/ataxia syndrome (FXTAS)    January 2024: The patient is a 78 y.o. male with the indicated medical history here for cardiology follow-up the patient was last seen in October 2022 for preoperative assessment prior to a urethral stricture dilatation.  At that time he was doing well and going to the gym without any issues.   The patient has been doing fairly well.  He occasionally gets left-sided chest discomfort when he is stressed.  This does not seem to happen reliably when he exerts himself however.  He denies any severe bleeding or bruising.  Has had no recurrent or severe shortness of breath.  He has had no routine angina with exertion.  He denies any presyncope syncope.  He has had some nuisance bruising but no severe bleeding.  He has not required emergency room visits or hospitalizations.   He is a retired IT trainer and an avid Actor.  Plan: Continue medical therapy  October 2025:  Patient consents to use of AI scribe. The patient returns for routine follow-up.  He was last seen in February of this year.  He had an episode of chest discomfort resulting in a emergency room visit.  A PET stress test was performed which demonstrated low risk findings.  He contacted our office earlier this month describing an episode of chest discomfort when walking.  It lasted a few minutes and stopped once he  stopped walking.  He was diaphoretic at the time.  He is here to discuss further.  He experiences chest discomfort while walking, such as when walking from the [facility] to his car, which was three blocks away. The discomfort is similar to previous episodes, lasts only a few minutes, and subsides with rest. He also experiences similar symptoms while sitting, which he attributes to stress.  He has a neurologic condition related to Fragile X syndrome, with tremors for the past twenty years, initially thought to be non-essential tremors but later attributed to Fragile X. He has a CGG repeat count of around 150, which is below the threshold for more severe manifestations. He is concerned about memory issues and a change in demeanor, becoming more short-tempered.  He is currently taking medication for nasal congestion, which allows him to breathe better. He fears falling, which has limited his physical activity. He used to walk with his wife for forty minutes but now avoids walking outside due to this fear, opting to walk inside places like [employer] or [employer].  Family history includes a brother who had tremors and type two diabetes, who passed away at seventy-two. He suspects his brother had the same neurologic condition but was not tested for it at the time.  No lightheadedness or blacking out spells. Reports nasal congestion requiring medication for relief.     Current Medications: Current Meds  Medication Sig   acetaminophen  (TYLENOL ) 500 MG tablet Take 1,000 mg by mouth at bedtime as needed for mild pain (joint pain).    amphetamine -dextroamphetamine  (ADDERALL) 5 MG tablet Take 1 tablet (5 mg total) by mouth 2 (two) times daily.   aspirin  EC 81 MG tablet Take 1 tablet (81 mg total) by mouth daily. Swallow whole.   Azelastine HCl 137 MCG/SPRAY SOLN Place 1 puff into both nostrils 2 (two) times daily.   calcium  citrate (CALCITRATE - DOSED IN MG ELEMENTAL CALCIUM ) 950 (200 Ca) MG tablet Take  200 mg of elemental calcium  by mouth daily.   Cholecalciferol 25 MCG (1000 UT) tablet Take 1,000 Units by mouth daily.   Clobetasol  Prop Emollient Base (CLOBETASOL  PROPIONATE E) 0.05 % emollient cream Apply 1 application topically 2 (two) times daily.   Coenzyme Q10 (CO Q-10) 200 MG CAPS Take 200 mg by mouth daily.    Cyanocobalamin  (VITAMIN B-12) 2500 MCG SUBL Take 2,000 mcg by mouth daily. Reports taking 2,000 mcg capsule   diclofenac  Sodium (VOLTAREN ) 1 % GEL Apply 1 application topically 4 (four) times daily as needed (pain).   ezetimibe  (ZETIA ) 10 MG tablet TAKE 1 TABLET BY MOUTH DAILY   famotidine (PEPCID) 20 MG tablet Take 20 mg by mouth every morning.   isosorbide mononitrate (IMDUR) 30 MG 24 hr tablet Take 0.5 tablets (15 mg total) by mouth at bedtime.   ketoconazole (NIZORAL) 2 % cream Apply 1 Application topically daily.   metoprolol  succinate (TOPROL  XL) 25 MG 24 hr tablet Take 1 tablet (25 mg total) by mouth at bedtime.  nitroGLYCERIN  (NITROSTAT ) 0.4 MG SL tablet Place 1 tablet (0.4 mg total) under the tongue every 5 (five) minutes as needed for chest pain.   pantoprazole  (PROTONIX ) 40 MG tablet TAKE 1 TABLET BY MOUTH  DAILY   primidone  (MYSOLINE ) 50 MG tablet TAKE 1 AND 1/2 TABLET BY MOUTH EVERY NIGHT AT BEDTIME (Patient taking differently: Take 50 mg by mouth 3 (three) times daily.)   rosuvastatin  (CRESTOR ) 20 MG tablet TAKE 1 TABLET BY MOUTH DAILY   [DISCONTINUED] propranolol  (INDERAL ) 10 MG tablet Take 1 tablet (10 mg total) by mouth 2 (two) times daily.     Review of Systems:   Please see the history of present illness.    All other systems reviewed and are negative.     EKGs/Labs/Other Test Reviewed:   EKG: 2025 sinus rhythm with repolarization abnormality  EKG Interpretation Date/Time:  Tuesday December 18 2023 14:34:52 EDT Ventricular Rate:  82 PR Interval:  156 QRS Duration:  92 QT Interval:  368 QTC Calculation: 429 R Axis:   63  Text Interpretation: Normal  sinus rhythm Cannot rule out Inferior infarct , age undetermined ST & T wave abnormality, consider anterolateral ischemia When compared with ECG of 19-Apr-2023 19:10, PREVIOUS ECG IS PRESENT Confirmed by Wendel Haws ([id number]) on 12/18/2023 2:35:51 PM        CARDIAC STUDIES: Refer to CV Procedures and Imaging Tabs   Risk Assessment/Calculations:          Physical Exam:   VS:  BP 120/64   Pulse 84   Ht 5' 5.5 (1.664 m)   Wt 163 lb (73.9 kg)   SpO2 96%   BMI 26.71 kg/m        Wt Readings from Last 3 Encounters:  12/18/23 163 lb (73.9 kg)  12/14/23 169 lb 14.4 oz (77.1 kg)  04/27/23 169 lb 6.4 oz (76.8 kg)      GENERAL:  No apparent distress, AOx3 HEENT:  No carotid bruits, +2 carotid impulses, no scleral icterus CAR: RRR no murmurs, gallops, rubs, or thrills RES:  Clear to auscultation bilaterally ABD:  Soft, nontender, nondistended, positive bowel sounds x 4 VASC:  +2 radial pulses, +2 carotid pulses NEURO:  CN 2-12 grossly intact; motor and sensory grossly intact; left upper extremity tremor present PSYCH:  No active depression or anxiety EXT:  No edema, ecchymosis, or cyanosis  Signed, Asna Muldrow K Mariangel Ringley, MD  12/18/2023 3:23 PM    [facility] [address] [place], [place], [place]  [address] Phone: [phone]; Fax: [phone]   Note:  This document was prepared using Dragon voice recognition software and may include unintentional dictation errors.

## 2023-12-14 ENCOUNTER — Other Ambulatory Visit: Payer: Self-pay

## 2023-12-14 ENCOUNTER — Other Ambulatory Visit: Payer: Medicare Other

## 2023-12-14 ENCOUNTER — Inpatient Hospital Stay: Payer: Medicare Other | Attending: Oncology | Admitting: Oncology

## 2023-12-14 VITALS — BP 124/79 | HR 62 | Temp 98.1°F | Resp 18 | Ht 66.0 in | Wt 169.9 lb

## 2023-12-14 DIAGNOSIS — C9110 Chronic lymphocytic leukemia of B-cell type not having achieved remission: Secondary | ICD-10-CM

## 2023-12-14 DIAGNOSIS — Z23 Encounter for immunization: Secondary | ICD-10-CM | POA: Diagnosis not present

## 2023-12-14 LAB — CBC WITH DIFFERENTIAL (CANCER CENTER ONLY)
Abs Immature Granulocytes: 0.02 K/uL (ref 0.00–0.07)
Basophils Absolute: 0.1 K/uL (ref 0.0–0.1)
Basophils Relative: 1 %
Eosinophils Absolute: 0.3 K/uL (ref 0.0–0.5)
Eosinophils Relative: 4 %
HCT: 45 % (ref 39.0–52.0)
Hemoglobin: 15.2 g/dL (ref 13.0–17.0)
Immature Granulocytes: 0 %
Lymphocytes Relative: 21 %
Lymphs Abs: 1.6 K/uL (ref 0.7–4.0)
MCH: 31.1 pg (ref 26.0–34.0)
MCHC: 33.8 g/dL (ref 30.0–36.0)
MCV: 92.2 fL (ref 80.0–100.0)
Monocytes Absolute: 0.7 K/uL (ref 0.1–1.0)
Monocytes Relative: 9 %
Neutro Abs: 5.1 K/uL (ref 1.7–7.7)
Neutrophils Relative %: 65 %
Platelet Count: 215 K/uL (ref 150–400)
RBC: 4.88 MIL/uL (ref 4.22–5.81)
RDW: 13.9 % (ref 11.5–15.5)
WBC Count: 7.9 K/uL (ref 4.0–10.5)
nRBC: 0 % (ref 0.0–0.2)

## 2023-12-14 NOTE — Progress Notes (Signed)
  [facility] OFFICE PROGRESS NOTE   Diagnosis: CLL  INTERVAL HISTORY:   Mr. Dalsanto returns as scheduled.  No fever, night sweats, palpable lymph nodes, or recent infection.  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  He reports the lesion was not a melanoma.  He is followed at [facility] for fragile X associated tremor/ataxia syndrome.  He has a persistent tremor and difficulty with balance.  Objective:  Vital signs in last 24 hours:  Blood pressure 124/79, pulse 62, temperature 98.1 F (36.7 C), temperature source Temporal, resp. rate 18, height 5' 6 (1.676 m), weight 169 lb 14.4 oz (77.1 kg), SpO2 100%.     Lymphatics: No cervical, supraclavicular, left axillary, or inguinal nodes.  Soft mobile 1 cm right axillary node versus a prominent fat pad Resp: Lungs clear bilaterally Cardio: Regular rate and rhythm GI: No hepatosplenomegaly Vascular: No leg edema   Lab Results:  Lab Results  Component Value Date   WBC 7.9 12/14/2023   HGB 15.2 12/14/2023   HCT 45.0 12/14/2023   MCV 92.2 12/14/2023   PLT 215 12/14/2023   NEUTROABS 5.1 12/14/2023    CMP  Lab Results  Component Value Date   NA 138 04/19/2023   K 4.0 04/19/2023   CL 98 04/19/2023   CO2 30 04/19/2023   GLUCOSE 74 04/19/2023   BUN 21 04/19/2023   CREATININE 0.97 04/19/2023   CALCIUM  9.9 04/19/2023   PROT 6.4 11/30/2022   ALBUMIN 4.5 11/30/2022   AST 23 11/30/2022   ALT 20 11/30/2022   ALKPHOS 75 11/30/2022   BILITOT 0.7 11/30/2022   GFRNONAA >60 04/19/2023   GFRAA >60 04/29/2019     Medications: I have reviewed the patient's current medications.   Assessment/Plan:  Chronic lymphocytic leukemia, diagnosed in 1996. He remains asymptomatic and stable from a hematologic standpoint. 2. Pneumococcal 23 vaccine given on 11/30/2015 , 13 valent pneumonia vaccine given 11/13/2013, pneumococcal 20 01/02/2022  3.  Fragile X-associated tremor/ataxia  syndrome   Disposition: Mr. Rumbold is stable from a hematologic standpoint and asymptomatic from CLL.  There is no clinical evidence of disease progression.  He like to continue follow-up in the hematology clinic.  He will return for an office visit in 1 year.  He will continue follow-up at [facility] for management of the Fragile X syndrome. He plans to obtain this years influenza and COVID-19 vaccines. Arley Hof, MD  12/14/2023  12:11 PM

## 2023-12-18 ENCOUNTER — Encounter: Payer: Self-pay | Admitting: Internal Medicine

## 2023-12-18 ENCOUNTER — Ambulatory Visit: Attending: Internal Medicine | Admitting: Internal Medicine

## 2023-12-18 VITALS — BP 120/64 | HR 84 | Ht 65.5 in | Wt 163.0 lb

## 2023-12-18 DIAGNOSIS — I10 Essential (primary) hypertension: Secondary | ICD-10-CM | POA: Diagnosis not present

## 2023-12-18 DIAGNOSIS — E785 Hyperlipidemia, unspecified: Secondary | ICD-10-CM | POA: Insufficient documentation

## 2023-12-18 DIAGNOSIS — I25118 Atherosclerotic heart disease of native coronary artery with other forms of angina pectoris: Secondary | ICD-10-CM | POA: Insufficient documentation

## 2023-12-18 DIAGNOSIS — I700 Atherosclerosis of aorta: Secondary | ICD-10-CM | POA: Insufficient documentation

## 2023-12-18 DIAGNOSIS — Z6827 Body mass index (BMI) 27.0-27.9, adult: Secondary | ICD-10-CM | POA: Diagnosis not present

## 2023-12-18 MED ORDER — NITROGLYCERIN 0.4 MG SL SUBL: 0.4000 mg | SUBLINGUAL_TABLET | SUBLINGUAL | 1 refills | Status: AC | PRN

## 2023-12-18 MED ORDER — METOPROLOL SUCCINATE ER 25 MG PO TB24: 25.0000 mg | ORAL_TABLET | Freq: Every day | ORAL | 3 refills | Status: AC

## 2023-12-18 MED ORDER — ISOSORBIDE MONONITRATE ER 30 MG PO TB24: 15.0000 mg | ORAL_TABLET | Freq: Every day | ORAL | 3 refills | Status: AC

## 2023-12-18 NOTE — Patient Instructions (Signed)
 Medication Instructions:  STOP Propranolol   START Metoprolol  Succinate (Toprol -XL) 25 mg once daily at bedtime  START Isosorbide Mononitrate (Imdur) 15 mg once daily at bedtime  AS NEEDED Nitroglycerin  0.4 mg SL The proper use and anticipated side effects of nitroglycerine has been carefully explained.  If a single episode of chest pain is not relieved by one tablet, the patient will try another within 5 minutes; and if this doesn't relieve the pain, the patient is instructed to call 911 for transportation to an emergency department.  *If you need a refill on your cardiac medications before your next appointment, please call your pharmacy*  Lab Work: To be completed today: CMP, CBC, and lipid panel  If you have labs (blood work) drawn today and your tests are completely normal, you will receive your results only by: MyChart Message (if you have MyChart) OR A paper copy in the mail If you have any lab test that is abnormal or we need to change your treatment, we will call you to review the results.  Testing/Procedures: None ordered today.  Follow-Up: At [facility], you and your health needs are our priority.  As part of our continuing mission to provide you with exceptional heart care, our providers are all part of one team.  This team includes your primary Cardiologist (physician) and Advanced Practice Providers or APPs (Physician Assistants and Nurse Practitioners) who all work together to provide you with the care you need, when you need it.  Your next appointment:   3 month(s)  Provider:   Arun Thukkani, MD

## 2023-12-19 ENCOUNTER — Ambulatory Visit: Payer: Self-pay | Admitting: Internal Medicine

## 2023-12-19 LAB — COMPREHENSIVE METABOLIC PANEL WITH GFR
ALT: 16 IU/L (ref 0–44)
AST: 22 IU/L (ref 0–40)
Albumin: 4.3 g/dL (ref 3.8–4.8)
Alkaline Phosphatase: 83 IU/L (ref 47–123)
BUN/Creatinine Ratio: 24 (ref 10–24)
BUN: 27 mg/dL (ref 8–27)
Bilirubin Total: 0.5 mg/dL (ref 0.0–1.2)
CO2: 23 mmol/L (ref 20–29)
Calcium: 9.1 mg/dL (ref 8.6–10.2)
Chloride: 102 mmol/L (ref 96–106)
Creatinine, Ser: 1.13 mg/dL (ref 0.76–1.27)
Globulin, Total: 1.9 g/dL (ref 1.5–4.5)
Glucose: 111 mg/dL — ABNORMAL HIGH (ref 70–99)
Potassium: 4.6 mmol/L (ref 3.5–5.2)
Sodium: 143 mmol/L (ref 134–144)
Total Protein: 6.2 g/dL (ref 6.0–8.5)
eGFR: 66 mL/min/1.73 (ref >59)

## 2023-12-19 LAB — LIPID PANEL
Chol/HDL Ratio: 2.4 ratio (ref 0.0–5.0)
Cholesterol, Total: 115 mg/dL (ref 100–199)
HDL: 47 mg/dL (ref >39)
LDL Chol Calc (NIH): 47 mg/dL (ref 0–99)
Triglycerides: 115 mg/dL (ref 0–149)
VLDL Cholesterol Cal: 21 mg/dL (ref 5–40)

## 2023-12-19 LAB — CBC
Hematocrit: 46.3 % (ref 37.5–51.0)
Hemoglobin: 15.1 g/dL (ref 13.0–17.7)
MCH: 30.6 pg (ref 26.6–33.0)
MCHC: 32.6 g/dL (ref 31.5–35.7)
MCV: 94 fL (ref 79–97)
Platelets: 233 x10E3/uL (ref 150–450)
RBC: 4.93 x10E6/uL (ref 4.14–5.80)
RDW: 12.8 % (ref 11.6–15.4)
WBC: 7.8 x10E3/uL (ref 3.4–10.8)

## 2023-12-20 DIAGNOSIS — H5203 Hypermetropia, bilateral: Secondary | ICD-10-CM | POA: Diagnosis not present

## 2023-12-20 DIAGNOSIS — D3131 Benign neoplasm of right choroid: Secondary | ICD-10-CM | POA: Diagnosis not present

## 2023-12-20 DIAGNOSIS — H43813 Vitreous degeneration, bilateral: Secondary | ICD-10-CM | POA: Diagnosis not present

## 2023-12-20 DIAGNOSIS — H52203 Unspecified astigmatism, bilateral: Secondary | ICD-10-CM | POA: Diagnosis not present

## 2023-12-28 ENCOUNTER — Encounter: Payer: Self-pay | Admitting: Dietician

## 2023-12-28 ENCOUNTER — Encounter: Attending: Internal Medicine | Admitting: Dietician

## 2023-12-28 DIAGNOSIS — R7303 Prediabetes: Secondary | ICD-10-CM

## 2023-12-28 NOTE — Progress Notes (Signed)
 Patient was seen on 12/28/2023 for the Diabetes Prevention Program course at [facility]. By the end of this session patients are able to complete the following objectives:   Learning Objectives: Describe the differences between unsaturated, saturated, and trans fat on heart health.  List dietary sources of unsaturated, saturated, and trans fats. Explain ways to reduce intake of saturated fat and replace them with heart healthy fats.  Goals:  Record weight taken outside of class.  Track foods and beverages eaten each day in the Food and Activity Tracker, including calories and fat grams for each item.   Track activity type, minutes you were active, and distance you reached each day in the Food and Activity Tracker.   Follow-Up Plan: Attend next session.  Bring completed Food and Activity Trackers to next session to be reviewed by Lifestyle Coach.

## 2024-01-04 DIAGNOSIS — E7800 Pure hypercholesterolemia, unspecified: Secondary | ICD-10-CM | POA: Diagnosis not present

## 2024-01-04 DIAGNOSIS — E669 Obesity, unspecified: Secondary | ICD-10-CM | POA: Diagnosis not present

## 2024-01-04 DIAGNOSIS — I2510 Atherosclerotic heart disease of native coronary artery without angina pectoris: Secondary | ICD-10-CM | POA: Diagnosis not present

## 2024-01-08 DIAGNOSIS — M109 Gout, unspecified: Secondary | ICD-10-CM | POA: Diagnosis not present

## 2024-01-08 DIAGNOSIS — I2510 Atherosclerotic heart disease of native coronary artery without angina pectoris: Secondary | ICD-10-CM | POA: Diagnosis not present

## 2024-01-08 DIAGNOSIS — R7303 Prediabetes: Secondary | ICD-10-CM | POA: Diagnosis not present

## 2024-01-08 DIAGNOSIS — F331 Major depressive disorder, recurrent, moderate: Secondary | ICD-10-CM | POA: Diagnosis not present

## 2024-01-08 DIAGNOSIS — G119 Hereditary ataxia, unspecified: Secondary | ICD-10-CM | POA: Diagnosis not present

## 2024-01-08 DIAGNOSIS — E230 Hypopituitarism: Secondary | ICD-10-CM | POA: Diagnosis not present

## 2024-01-08 DIAGNOSIS — Z0000 Encounter for general adult medical examination without abnormal findings: Secondary | ICD-10-CM | POA: Diagnosis not present

## 2024-01-08 DIAGNOSIS — Z1331 Encounter for screening for depression: Secondary | ICD-10-CM | POA: Diagnosis not present

## 2024-01-08 DIAGNOSIS — K909 Intestinal malabsorption, unspecified: Secondary | ICD-10-CM | POA: Diagnosis not present

## 2024-01-08 DIAGNOSIS — I9589 Other hypotension: Secondary | ICD-10-CM | POA: Diagnosis not present

## 2024-01-08 DIAGNOSIS — F900 Attention-deficit hyperactivity disorder, predominantly inattentive type: Secondary | ICD-10-CM | POA: Diagnosis not present

## 2024-01-08 DIAGNOSIS — C9110 Chronic lymphocytic leukemia of B-cell type not having achieved remission: Secondary | ICD-10-CM | POA: Diagnosis not present

## 2024-01-08 DIAGNOSIS — Z79899 Other long term (current) drug therapy: Secondary | ICD-10-CM | POA: Diagnosis not present

## 2024-01-08 DIAGNOSIS — N400 Enlarged prostate without lower urinary tract symptoms: Secondary | ICD-10-CM | POA: Diagnosis not present

## 2024-01-09 DIAGNOSIS — I2510 Atherosclerotic heart disease of native coronary artery without angina pectoris: Secondary | ICD-10-CM | POA: Diagnosis not present

## 2024-01-17 DIAGNOSIS — L570 Actinic keratosis: Secondary | ICD-10-CM | POA: Diagnosis not present

## 2024-01-18 ENCOUNTER — Encounter: Payer: Self-pay | Admitting: Dietician

## 2024-01-18 ENCOUNTER — Encounter: Attending: Internal Medicine | Admitting: Dietician

## 2024-01-18 DIAGNOSIS — R7303 Prediabetes: Secondary | ICD-10-CM

## 2024-01-18 NOTE — Progress Notes (Signed)
 Class start Time: 1400   Class End Time: 1500  This was a class of 3 patients.  Patient was seen on 01/18/2024 for the Diabetes Prevention Program course at [facility]. By the end of this session patients are able to complete the following objectives:   Learning Objectives: List 9 ways to to stay on track during special events/occasions. Create a plan to avoid a food problem at an upcoming special event/occasion Describe ways to make time for healthy behaviors during special events/occasions   Goals:  Record weight taken outside of class.  Track foods and beverages eaten each day in the Food and Activity Tracker, including calories and fat grams for each item.   Track activity type, minutes you were active, and distance you reached each day in the Food and Activity Tracker.   Follow-Up Plan: Attend next session.  Bring completed Food and Activity Trackers to next session to be reviewed by Lifestyle Coach.

## 2024-01-19 ENCOUNTER — Other Ambulatory Visit: Payer: Self-pay | Admitting: Internal Medicine

## 2024-02-13 DIAGNOSIS — R3912 Poor urinary stream: Secondary | ICD-10-CM | POA: Diagnosis not present

## 2024-02-13 DIAGNOSIS — N401 Enlarged prostate with lower urinary tract symptoms: Secondary | ICD-10-CM | POA: Diagnosis not present

## 2024-02-13 DIAGNOSIS — R310 Gross hematuria: Secondary | ICD-10-CM | POA: Diagnosis not present

## 2024-02-15 ENCOUNTER — Encounter

## 2024-03-11 ENCOUNTER — Encounter: Payer: Self-pay | Admitting: Internal Medicine

## 2024-03-13 NOTE — Progress Notes (Unsigned)
 "  Cardiology Office Note:   Date:  03/13/2024  ID:  Cole Martinez, DOB 09-Dec-1945, MRN [id number] PCP:  Charlott Dorn LABOR, MD  [facility] Providers Cardiologist:  Wendel Haws, MD Referring MD: Charlott Dorn LABOR, MD  Chief Complaint/Reason for Referral: CAD ASSESSMENT:    1. Coronary artery disease of native artery of native heart with stable angina pectoris   2. Hyperlipidemia LDL goal <70   3. Aortic atherosclerosis   4. Essential hypertension   5. BMI 27.0-27.9,adult      PLAN:   In order of problems listed above: Angina pectoris: Start Imdur  15 mg at bedtime, continue aspirin  81 mg, Toprol  25mg  at bedtime, as needed nitroglycerin .  If not improved, will refer for coronary angiography and possible PCI.  Patient will let us  know how he is doing next week and if he continues to have anginal symptoms we will pursue coronary angiography and possible PCI Hyperlipidemia: Continue rosuvastatin  20 mg.  LDL was 47 in October 2025.   Aortic atherosclerosis: Continue aspirin  81 mg, rosuvastatin  20 mg Hypertension: Continue Toprol  25mg .  *** Elevated BMI: Diet and exercise modification:            Dispo:  No follow-ups on file.       I spent *** minutes reviewing all clinical data during and prior to this visit including all relevant imaging studies, laboratories, clinical information from other health systems and prior notes from both Cardiology and other specialties, interviewing the patient, conducting a complete physical examination, and coordinating care in order to formulate a comprehensive and personalized evaluation and treatment plan.   History of Present Illness:    FOCUSED PROBLEM LIST:   Coronary artery disease  S/p BMS to LAD 1999 Myoview  10/18: no ischemia, EF 57 Myoview  01/24/2019: EF 62, normal perfusion, low risk LHC 02/13/2019: LM patent, LAD stent 70-80 ISR, Dx ostial 85 (jailed); LCx patent; RCA 90-95, EF 45-50 >> CABG vs PCI - pt opted for  PCI Cath 02/14/2019  S/p 2.75 x 15 mm DES pRCA and LAD BMS ISR 50-70>> med Rx TTE 04/07/2022: EF 55-60, no RWMA, mild LVH, GR 1 DD, normal RVSF, mild LAE, trivial MR, mild AI, AV sclerosis, aortic root 39 mm PET PET stress 2025 low risk Hyperlipidemia  Aortic atherosclerosis PET stress test 2025 Hypertension  BMI 27 S/p R TKR 03/2017  Chronic Lymphocytic Leukemia  BPH  Crohn's Dz  Essential Tremor  AAA US  07/2011: no AAA Fragile X assoc tremor/ataxia syndrome (FXTAS)    January 2024: The patient is a 79 y.o. male with the indicated medical history here for cardiology follow-up the patient was last seen in October 2022 for preoperative assessment prior to a urethral stricture dilatation.  At that time he was doing well and going to the gym without any issues.   The patient has been doing fairly well.  He occasionally gets left-sided chest discomfort when he is stressed.  This does not seem to happen reliably when he exerts himself however.  He denies any severe bleeding or bruising.  Has had no recurrent or severe shortness of breath.  He has had no routine angina with exertion.  He denies any presyncope syncope.  He has had some nuisance bruising but no severe bleeding.  He has not required emergency room visits or hospitalizations.   He is a retired IT TRAINER and an avid Actor.  Plan: Continue medical therapy  October 2025:  Patient consents to use of AI scribe. The  patient returns for routine follow-up.  He was last seen in February of this year.  He had an episode of chest discomfort resulting in a emergency room visit.  A PET stress test was performed which demonstrated low risk findings.  He contacted our office earlier this month describing an episode of chest discomfort when walking.  It lasted a few minutes and stopped once he stopped walking.  He was diaphoretic at the time.  He is here to discuss further.  He experiences chest discomfort while walking, such as when walking from  the [facility] to his car, which was three blocks away. The discomfort is similar to previous episodes, lasts only a few minutes, and subsides with rest. He also experiences similar symptoms while sitting, which he attributes to stress.  He has a neurologic condition related to Fragile X syndrome, with tremors for the past twenty years, initially thought to be non-essential tremors but later attributed to Fragile X. He has a CGG repeat count of around 150, which is below the threshold for more severe manifestations. He is concerned about memory issues and a change in demeanor, becoming more short-tempered.  He is currently taking medication for nasal congestion, which allows him to breathe better. He fears falling, which has limited his physical activity. He used to walk with his wife for forty minutes but now avoids walking outside due to this fear, opting to walk inside places like [employer] or [employer].  Family history includes a brother who had tremors and type two diabetes, who passed away at seventy-two. He suspects his brother had the same neurologic condition but was not tested for it at the time.  No lightheadedness or blacking out spells. Reports nasal congestion requiring medication for relief.  Plan:  Start Imdur  15mg , d/c propanolol, start Toprol  25mg  QPM, PRN NTG, check lipids, LFTs.  F/U 3 months.  January 2026:  Patient consents to use of AI scribe. The patient returns for follow up.  His LDL was at goal in October.     Current Medications: No outpatient medications have been marked as taking for the 03/20/24 encounter (Appointment) with Lucerito Rosinski K, MD.     Review of Systems:   Please see the history of present illness.    All other systems reviewed and are negative.     EKGs/Labs/Other Test Reviewed:   EKG: October 2025 sinus rhythm with repolarization abnormality  EKG Interpretation Date/Time:    Ventricular Rate:    PR Interval:    QRS Duration:    QT  Interval:    QTC Calculation:   R Axis:      Text Interpretation:          CARDIAC STUDIES: Refer to CV Procedures and Imaging Tabs   Risk Assessment/Calculations:          Physical Exam:   VS:  There were no vitals taken for this visit.   No BP recorded.  {Refresh Note OR Click here to enter BP  :1}***   Wt Readings from Last 3 Encounters:  12/18/23 163 lb (73.9 kg)  12/14/23 169 lb 14.4 oz (77.1 kg)  04/27/23 169 lb 6.4 oz (76.8 kg)      GENERAL:  No apparent distress, AOx3 HEENT:  No carotid bruits, +2 carotid impulses, no scleral icterus CAR: RRR no murmurs, gallops, rubs, or thrills RES:  Clear to auscultation bilaterally ABD:  Soft, nontender, nondistended, positive bowel sounds x 4 VASC:  +2 radial pulses, +2 carotid pulses NEURO:  CN 2-12 grossly intact; motor and sensory grossly intact; left upper extremity tremor present PSYCH:  No active depression or anxiety EXT:  No edema, ecchymosis, or cyanosis  Signed, Latoyna Hird K Clayborne Divis, MD  03/13/2024 11:30 AM    [facility] [address] [place], [place], [place]  [address] Phone: [phone]; Fax: [phone]   Note:  This document was prepared using Dragon voice recognition software and may include unintentional dictation errors. "

## 2024-03-20 ENCOUNTER — Encounter: Payer: Self-pay | Admitting: Internal Medicine

## 2024-03-20 ENCOUNTER — Ambulatory Visit: Attending: Internal Medicine | Admitting: Internal Medicine

## 2024-03-20 VITALS — BP 122/68 | HR 62 | Ht 66.0 in | Wt 164.0 lb

## 2024-03-20 DIAGNOSIS — I10 Essential (primary) hypertension: Secondary | ICD-10-CM | POA: Diagnosis not present

## 2024-03-20 DIAGNOSIS — Z6827 Body mass index (BMI) 27.0-27.9, adult: Secondary | ICD-10-CM | POA: Diagnosis not present

## 2024-03-20 DIAGNOSIS — E785 Hyperlipidemia, unspecified: Secondary | ICD-10-CM | POA: Insufficient documentation

## 2024-03-20 DIAGNOSIS — E7841 Elevated Lipoprotein(a): Secondary | ICD-10-CM | POA: Insufficient documentation

## 2024-03-20 DIAGNOSIS — I25118 Atherosclerotic heart disease of native coronary artery with other forms of angina pectoris: Secondary | ICD-10-CM | POA: Diagnosis not present

## 2024-03-20 DIAGNOSIS — I700 Atherosclerosis of aorta: Secondary | ICD-10-CM | POA: Insufficient documentation

## 2024-03-20 NOTE — Patient Instructions (Signed)
 Medication Instructions:  No changes *If you need a refill on your cardiac medications before your next appointment, please call your pharmacy*  Lab Work: None ordered If you have labs (blood work) drawn today and your tests are completely normal, you will receive your results only by: MyChart Message (if you have MyChart) OR A paper copy in the mail If you have any lab test that is abnormal or we need to change your treatment, we will call you to review the results.  Testing/Procedures: None ordered  Follow-Up: At [facility], you and your health needs are our priority.  As part of our continuing mission to provide you with exceptional heart care, our providers are all part of one team.  This team includes your primary Cardiologist (physician) and Advanced Practice Providers or APPs (Physician Assistants and Nurse Practitioners) who all work together to provide you with the care you need, when you need it.  Your next appointment:   6 month(s)  Provider:   Arun K Thukkani, MD    We recommend signing up for the patient portal called MyChart.  Sign up information is provided on this After Visit Summary.  MyChart is used to connect with patients for Virtual Visits (Telemedicine).  Patients are able to view lab/test results, encounter notes, upcoming appointments, etc.  Non-urgent messages can be sent to your provider as well.   To learn more about what you can do with MyChart, go to [email or web address].

## 2024-03-21 ENCOUNTER — Encounter: Attending: Internal Medicine | Admitting: Dietician

## 2024-03-21 ENCOUNTER — Encounter: Payer: Self-pay | Admitting: Dietician

## 2024-03-21 DIAGNOSIS — R7303 Prediabetes: Secondary | ICD-10-CM | POA: Diagnosis not present

## 2024-03-21 NOTE — Progress Notes (Signed)
 Class start Time: 1405   Class End Time: 1508  This was a class of 4 patients.  Patient was seen on 03/21/2024 for the Diabetes Prevention Program course at [facility]. By the end of this session patients are able to complete the following objectives:   Learning Objectives: Reflect on lifestyle changes they have made since starting the DPP.  Set long-term goals to promote continued maintenance of lifestyle changes made during the program.   Goals:  Work toward reaching new long-term goals set during class.   Follow-Up Plan: Contact Lifestyle Coach with questions/concerns PRN.
